# Patient Record
Sex: Female | Born: 1950 | Race: White | Hispanic: No | Marital: Married | Smoking: Former smoker
Health system: Southern US, Community
[De-identification: ages and names within clinical notes are randomized; demographics above are authoritative.]

## PROBLEM LIST (undated history)

## (undated) DIAGNOSIS — M47818 Spondylosis without myelopathy or radiculopathy, sacral and sacrococcygeal region: Secondary | ICD-10-CM

## (undated) DIAGNOSIS — K5900 Constipation, unspecified: Secondary | ICD-10-CM

## (undated) DIAGNOSIS — D649 Anemia, unspecified: Secondary | ICD-10-CM

## (undated) DIAGNOSIS — E785 Hyperlipidemia, unspecified: Secondary | ICD-10-CM

## (undated) DIAGNOSIS — M5416 Radiculopathy, lumbar region: Secondary | ICD-10-CM

## (undated) DIAGNOSIS — G43909 Migraine, unspecified, not intractable, without status migrainosus: Secondary | ICD-10-CM

## (undated) DIAGNOSIS — M549 Dorsalgia, unspecified: Secondary | ICD-10-CM

## (undated) DIAGNOSIS — Z90710 Acquired absence of both cervix and uterus: Secondary | ICD-10-CM

## (undated) DIAGNOSIS — Z981 Arthrodesis status: Secondary | ICD-10-CM

## (undated) DIAGNOSIS — F32A Depression, unspecified: Secondary | ICD-10-CM

## (undated) DIAGNOSIS — M797 Fibromyalgia: Secondary | ICD-10-CM

## (undated) DIAGNOSIS — C801 Malignant (primary) neoplasm, unspecified: Secondary | ICD-10-CM

## (undated) DIAGNOSIS — I2510 Atherosclerotic heart disease of native coronary artery without angina pectoris: Secondary | ICD-10-CM

## (undated) DIAGNOSIS — M9979 Connective tissue and disc stenosis of intervertebral foramina of abdomen and other regions: Secondary | ICD-10-CM

## (undated) DIAGNOSIS — M533 Sacrococcygeal disorders, not elsewhere classified: Secondary | ICD-10-CM

## (undated) DIAGNOSIS — G8918 Other acute postprocedural pain: Secondary | ICD-10-CM

## (undated) DIAGNOSIS — K219 Gastro-esophageal reflux disease without esophagitis: Secondary | ICD-10-CM

## (undated) DIAGNOSIS — R011 Cardiac murmur, unspecified: Secondary | ICD-10-CM

## (undated) DIAGNOSIS — G501 Atypical facial pain: Secondary | ICD-10-CM

## (undated) DIAGNOSIS — M1990 Unspecified osteoarthritis, unspecified site: Secondary | ICD-10-CM

## (undated) DIAGNOSIS — M539 Dorsopathy, unspecified: Secondary | ICD-10-CM

## (undated) DIAGNOSIS — M545 Low back pain: Secondary | ICD-10-CM

## (undated) DIAGNOSIS — I10 Essential (primary) hypertension: Secondary | ICD-10-CM

## (undated) DIAGNOSIS — I209 Angina pectoris, unspecified: Secondary | ICD-10-CM

## (undated) DIAGNOSIS — F419 Anxiety disorder, unspecified: Secondary | ICD-10-CM

## (undated) DIAGNOSIS — G8929 Other chronic pain: Secondary | ICD-10-CM

## (undated) DIAGNOSIS — F329 Major depressive disorder, single episode, unspecified: Secondary | ICD-10-CM

## (undated) HISTORY — PX: TONSILLECTOMY: SUR1361

## (undated) HISTORY — DX: Dorsalgia, unspecified: M54.9

## (undated) HISTORY — PX: TOE FUSION: SHX1070

## (undated) HISTORY — DX: Arthrodesis status: Z98.1

## (undated) HISTORY — DX: Migraine, unspecified, not intractable, without status migrainosus: G43.909

## (undated) HISTORY — DX: Radiculopathy, lumbar region: M54.16

## (undated) HISTORY — DX: Low back pain: M54.5

## (undated) HISTORY — PX: TUMOR REMOVAL: SHX12

## (undated) HISTORY — PX: SHOULDER SURGERY: SHX246

## (undated) HISTORY — DX: Unspecified osteoarthritis, unspecified site: M19.90

## (undated) HISTORY — DX: Anxiety disorder, unspecified: F41.9

## (undated) HISTORY — DX: Essential (primary) hypertension: I10

## (undated) HISTORY — PX: ABDOMINAL HYSTERECTOMY: SHX81

## (undated) HISTORY — DX: Anemia, unspecified: D64.9

## (undated) HISTORY — DX: Angina pectoris, unspecified: I20.9

## (undated) HISTORY — DX: Depression, unspecified: F32.A

## (undated) HISTORY — PX: OTHER SURGICAL HISTORY: SHX169

## (undated) HISTORY — DX: Atherosclerotic heart disease of native coronary artery without angina pectoris: I25.10

## (undated) HISTORY — DX: Fibromyalgia: M79.7

## (undated) HISTORY — DX: Other acute postprocedural pain: G89.18

## (undated) HISTORY — DX: Major depressive disorder, single episode, unspecified: F32.9

## (undated) HISTORY — DX: Acquired absence of both cervix and uterus: Z90.710

## (undated) HISTORY — PX: NECK SURGERY: SHX720

## (undated) HISTORY — PX: CHOLECYSTECTOMY: SHX55

## (undated) HISTORY — DX: Hyperlipidemia, unspecified: E78.5

## (undated) HISTORY — DX: Gastro-esophageal reflux disease without esophagitis: K21.9

## (undated) HISTORY — DX: Sacrococcygeal disorders, not elsewhere classified: M53.3

## (undated) HISTORY — PX: APPENDECTOMY: SHX54

## (undated) HISTORY — DX: Atypical facial pain: G50.1

## (undated) HISTORY — DX: Connective tissue and disc stenosis of intervertebral foramina of abdomen and other regions: M99.79

## (undated) HISTORY — DX: Dorsopathy, unspecified: M53.9

## (undated) HISTORY — DX: Other chronic pain: G89.29

## (undated) HISTORY — DX: Spondylosis without myelopathy or radiculopathy, sacral and sacrococcygeal region: M47.818

---

## 1977-07-01 DIAGNOSIS — Z90710 Acquired absence of both cervix and uterus: Secondary | ICD-10-CM

## 1977-07-01 HISTORY — DX: Acquired absence of both cervix and uterus: Z90.710

## 1998-05-26 ENCOUNTER — Encounter: Payer: Self-pay | Admitting: Neurosurgery

## 1998-05-26 ENCOUNTER — Ambulatory Visit (HOSPITAL_COMMUNITY): Admission: RE | Admit: 1998-05-26 | Discharge: 1998-05-26 | Payer: Self-pay | Admitting: Neurosurgery

## 1998-06-06 ENCOUNTER — Encounter: Payer: Self-pay | Admitting: Neurosurgery

## 1998-06-09 ENCOUNTER — Encounter: Payer: Self-pay | Admitting: Neurosurgery

## 1998-06-09 ENCOUNTER — Inpatient Hospital Stay (HOSPITAL_COMMUNITY): Admission: RE | Admit: 1998-06-09 | Discharge: 1998-06-10 | Payer: Self-pay | Admitting: Neurosurgery

## 1998-09-28 ENCOUNTER — Ambulatory Visit (HOSPITAL_COMMUNITY): Admission: RE | Admit: 1998-09-28 | Discharge: 1998-09-28 | Payer: Self-pay | Admitting: Neurosurgery

## 1998-09-28 ENCOUNTER — Encounter: Payer: Self-pay | Admitting: Neurosurgery

## 1998-11-01 ENCOUNTER — Encounter: Payer: Self-pay | Admitting: Neurosurgery

## 1998-11-01 ENCOUNTER — Ambulatory Visit (HOSPITAL_COMMUNITY): Admission: RE | Admit: 1998-11-01 | Discharge: 1998-11-01 | Payer: Self-pay | Admitting: Neurosurgery

## 2004-04-30 ENCOUNTER — Ambulatory Visit: Payer: Self-pay | Admitting: Unknown Physician Specialty

## 2004-05-03 ENCOUNTER — Ambulatory Visit: Payer: Self-pay | Admitting: Physician Assistant

## 2004-08-30 ENCOUNTER — Ambulatory Visit: Payer: Self-pay | Admitting: Physician Assistant

## 2004-09-17 ENCOUNTER — Emergency Department: Payer: Self-pay | Admitting: Emergency Medicine

## 2005-01-07 ENCOUNTER — Ambulatory Visit: Payer: Self-pay | Admitting: Physician Assistant

## 2005-03-01 ENCOUNTER — Ambulatory Visit: Payer: Self-pay | Admitting: Nurse Practitioner

## 2005-05-02 ENCOUNTER — Ambulatory Visit: Payer: Self-pay | Admitting: Physician Assistant

## 2005-06-04 ENCOUNTER — Ambulatory Visit: Payer: Self-pay

## 2005-08-26 ENCOUNTER — Ambulatory Visit: Payer: Self-pay | Admitting: Physician Assistant

## 2005-12-23 ENCOUNTER — Ambulatory Visit: Payer: Self-pay | Admitting: Physician Assistant

## 2006-04-14 ENCOUNTER — Ambulatory Visit: Payer: Self-pay | Admitting: Physician Assistant

## 2006-07-14 ENCOUNTER — Ambulatory Visit: Payer: Self-pay | Admitting: Physician Assistant

## 2006-10-14 ENCOUNTER — Ambulatory Visit: Payer: Self-pay | Admitting: Physician Assistant

## 2006-11-19 ENCOUNTER — Ambulatory Visit: Payer: Self-pay | Admitting: Physician Assistant

## 2006-11-27 ENCOUNTER — Ambulatory Visit: Payer: Self-pay | Admitting: Unknown Physician Specialty

## 2006-11-27 ENCOUNTER — Ambulatory Visit: Payer: Self-pay | Admitting: Pain Medicine

## 2006-12-10 ENCOUNTER — Ambulatory Visit: Payer: Self-pay | Admitting: Unknown Physician Specialty

## 2006-12-11 ENCOUNTER — Ambulatory Visit: Payer: Self-pay | Admitting: Physician Assistant

## 2006-12-29 ENCOUNTER — Emergency Department: Payer: Self-pay | Admitting: Emergency Medicine

## 2007-01-12 ENCOUNTER — Ambulatory Visit: Payer: Self-pay | Admitting: Physician Assistant

## 2007-01-20 ENCOUNTER — Ambulatory Visit: Payer: Self-pay | Admitting: Pain Medicine

## 2007-02-09 ENCOUNTER — Ambulatory Visit: Payer: Self-pay

## 2007-02-25 ENCOUNTER — Emergency Department (HOSPITAL_COMMUNITY): Admission: EM | Admit: 2007-02-25 | Discharge: 2007-02-25 | Payer: Self-pay | Admitting: Emergency Medicine

## 2007-03-06 ENCOUNTER — Emergency Department: Payer: Self-pay | Admitting: Emergency Medicine

## 2007-03-06 ENCOUNTER — Ambulatory Visit: Payer: Self-pay | Admitting: Physician Assistant

## 2007-04-06 ENCOUNTER — Ambulatory Visit: Payer: Self-pay | Admitting: Physician Assistant

## 2007-05-05 ENCOUNTER — Encounter: Payer: Self-pay | Admitting: Neurosurgery

## 2007-06-01 ENCOUNTER — Encounter: Payer: Self-pay | Admitting: Neurosurgery

## 2007-06-07 ENCOUNTER — Ambulatory Visit: Payer: Self-pay | Admitting: Internal Medicine

## 2007-06-19 ENCOUNTER — Ambulatory Visit: Payer: Self-pay | Admitting: Physician Assistant

## 2007-07-06 ENCOUNTER — Ambulatory Visit: Payer: Self-pay | Admitting: Unknown Physician Specialty

## 2007-07-14 ENCOUNTER — Ambulatory Visit: Payer: Self-pay | Admitting: Pain Medicine

## 2007-07-23 ENCOUNTER — Ambulatory Visit: Payer: Self-pay | Admitting: Physician Assistant

## 2007-07-24 ENCOUNTER — Ambulatory Visit: Payer: Self-pay | Admitting: Neurology

## 2007-08-20 ENCOUNTER — Ambulatory Visit: Payer: Self-pay | Admitting: Physician Assistant

## 2007-08-23 ENCOUNTER — Emergency Department: Payer: Self-pay | Admitting: Emergency Medicine

## 2007-08-25 ENCOUNTER — Ambulatory Visit: Payer: Self-pay | Admitting: Otolaryngology

## 2007-10-15 ENCOUNTER — Ambulatory Visit: Payer: Self-pay | Admitting: Otolaryngology

## 2007-11-11 ENCOUNTER — Ambulatory Visit: Payer: Self-pay | Admitting: Physician Assistant

## 2007-11-19 ENCOUNTER — Ambulatory Visit: Payer: Self-pay | Admitting: Pain Medicine

## 2007-12-10 ENCOUNTER — Ambulatory Visit: Payer: Self-pay | Admitting: Physician Assistant

## 2008-01-07 ENCOUNTER — Ambulatory Visit: Payer: Self-pay | Admitting: Pain Medicine

## 2008-02-09 ENCOUNTER — Ambulatory Visit: Payer: Self-pay | Admitting: Physician Assistant

## 2008-02-29 ENCOUNTER — Ambulatory Visit: Payer: Self-pay | Admitting: Pain Medicine

## 2008-03-03 ENCOUNTER — Ambulatory Visit: Payer: Self-pay | Admitting: Pain Medicine

## 2008-03-22 ENCOUNTER — Ambulatory Visit: Payer: Self-pay | Admitting: Physician Assistant

## 2008-03-29 ENCOUNTER — Ambulatory Visit: Payer: Self-pay | Admitting: Unknown Physician Specialty

## 2008-04-11 ENCOUNTER — Ambulatory Visit: Payer: Self-pay | Admitting: Pain Medicine

## 2008-04-28 ENCOUNTER — Ambulatory Visit: Payer: Self-pay | Admitting: Physician Assistant

## 2008-08-23 ENCOUNTER — Ambulatory Visit: Payer: Self-pay | Admitting: Physician Assistant

## 2008-09-01 ENCOUNTER — Ambulatory Visit (HOSPITAL_COMMUNITY): Admission: RE | Admit: 2008-09-01 | Discharge: 2008-09-02 | Payer: Self-pay | Admitting: Neurosurgery

## 2008-12-06 ENCOUNTER — Ambulatory Visit: Payer: Self-pay | Admitting: Physician Assistant

## 2009-03-08 ENCOUNTER — Ambulatory Visit: Payer: Self-pay | Admitting: Pain Medicine

## 2009-03-23 ENCOUNTER — Ambulatory Visit: Payer: Self-pay | Admitting: Physician Assistant

## 2009-04-27 ENCOUNTER — Ambulatory Visit: Payer: Self-pay | Admitting: Unknown Physician Specialty

## 2009-05-30 ENCOUNTER — Ambulatory Visit: Payer: Self-pay | Admitting: Physician Assistant

## 2009-06-08 ENCOUNTER — Ambulatory Visit (HOSPITAL_COMMUNITY): Admission: RE | Admit: 2009-06-08 | Discharge: 2009-06-09 | Payer: Self-pay | Admitting: Neurosurgery

## 2009-08-28 ENCOUNTER — Ambulatory Visit: Payer: Self-pay | Admitting: Pain Medicine

## 2009-09-05 ENCOUNTER — Ambulatory Visit: Payer: Self-pay | Admitting: Pain Medicine

## 2009-10-05 ENCOUNTER — Ambulatory Visit: Payer: Self-pay | Admitting: Pain Medicine

## 2010-01-27 ENCOUNTER — Encounter: Admission: RE | Admit: 2010-01-27 | Discharge: 2010-01-27 | Payer: Self-pay | Admitting: Neurosurgery

## 2010-04-17 ENCOUNTER — Ambulatory Visit: Payer: Self-pay | Admitting: Family Medicine

## 2010-05-30 ENCOUNTER — Ambulatory Visit: Payer: Self-pay | Admitting: Unknown Physician Specialty

## 2010-06-06 ENCOUNTER — Ambulatory Visit: Payer: Self-pay | Admitting: Family Medicine

## 2010-07-19 ENCOUNTER — Ambulatory Visit: Payer: Self-pay | Admitting: Pain Medicine

## 2010-08-08 ENCOUNTER — Ambulatory Visit: Payer: Self-pay | Admitting: Pain Medicine

## 2010-09-11 ENCOUNTER — Ambulatory Visit: Payer: Self-pay | Admitting: Pain Medicine

## 2010-09-24 ENCOUNTER — Ambulatory Visit: Payer: Self-pay | Admitting: Pain Medicine

## 2010-10-02 LAB — CBC
HCT: 38.6 % (ref 36.0–46.0)
Hemoglobin: 13.3 g/dL (ref 12.0–15.0)
MCHC: 34.5 g/dL (ref 30.0–36.0)
MCV: 94.9 fL (ref 78.0–100.0)
Platelets: 196 10*3/uL (ref 150–400)
RBC: 4.07 MIL/uL (ref 3.87–5.11)
RDW: 14 % (ref 11.5–15.5)
WBC: 6 10*3/uL (ref 4.0–10.5)

## 2010-10-16 ENCOUNTER — Ambulatory Visit: Payer: Self-pay | Admitting: Family Medicine

## 2010-10-16 LAB — COMPREHENSIVE METABOLIC PANEL
ALT: 26 U/L (ref 0–35)
AST: 32 U/L (ref 0–37)
Albumin: 3.5 g/dL (ref 3.5–5.2)
Alkaline Phosphatase: 89 U/L (ref 39–117)
BUN: 8 mg/dL (ref 6–23)
CO2: 28 mEq/L (ref 19–32)
Calcium: 9.3 mg/dL (ref 8.4–10.5)
Chloride: 103 mEq/L (ref 96–112)
Creatinine, Ser: 0.82 mg/dL (ref 0.4–1.2)
GFR calc Af Amer: >60 mL/min (ref >60)
GFR calc non Af Amer: >60 mL/min (ref >60)
Glucose, Bld: 87 mg/dL (ref 70–99)
Potassium: 4.2 mEq/L (ref 3.5–5.1)
Sodium: 138 mEq/L (ref 135–145)
Total Bilirubin: 0.5 mg/dL (ref 0.3–1.2)
Total Protein: 5.4 g/dL — ABNORMAL LOW (ref 6.0–8.3)

## 2010-10-16 LAB — CBC
HCT: 38.9 % (ref 36.0–46.0)
Hemoglobin: 13.3 g/dL (ref 12.0–15.0)
MCHC: 34.2 g/dL (ref 30.0–36.0)
MCV: 100.2 fL — ABNORMAL HIGH (ref 78.0–100.0)
Platelets: 217 10*3/uL (ref 150–400)
RBC: 3.89 MIL/uL (ref 3.87–5.11)
RDW: 13.8 % (ref 11.5–15.5)
WBC: 4 10*3/uL (ref 4.0–10.5)

## 2010-11-13 NOTE — Op Note (Signed)
NAMETAZIYAH, IANNUZZI              ACCOUNT NO.:  [id number]   MEDICAL RECORD NO.:  [id number]          PATIENT TYPE:  OIB   LOCATION:  3599                         FACILITY:  [facility]   PHYSICIAN:  Danae Orleans. Venetia Maxon, M.D.  DATE OF BIRTH:  April 22, 1951   DATE OF PROCEDURE:  09/01/2008  DATE OF DISCHARGE:                               OPERATIVE REPORT   PREOPERATIVE DIAGNOSES:  Herniated cervical disk with cervical  spondylosis, degenerative disk disease, and radiculopathy with neck  pain, C4-5 and C5-6.   POSTOPERATIVE DIAGNOSES:  Herniated cervical disk with cervical  spondylosis, degenerative disk disease, and radiculopathy with neck  pain, C4-5 and C5-6.   PROCEDURES:  1. Exploration and fusion, C6-7 with anterior cervical decompression      and fusion, C4-5 and C5-6 with allograft, autograft, and      Equivabone.  2. Anterior cervical plating, C4-C6 levels.   SURGEON:  Danae Orleans. Venetia Maxon, MD   ASSISTANTS:  1. Georgiann Cocker, RN  2. Hilda Lias, MD   ANESTHESIA:  General endotracheal anesthesia.   ESTIMATED BLOOD LOSS:  Minimal.   COMPLICATIONS:  None.   DISPOSITION:  Recovery.   INDICATIONS:  Davetta Olliff is a 60 year old woman with cervical  spondylosis, disk degeneration, cervical radiculopathy and neck pain, C4-  5 and C5-6 levels.  She had previously undergone anterior cervical  decompression and fusion, C6-7 levels.  It was elected to take her to  surgery for anterior cervical decompression and fusion at C4-5 and C5-6,  and exploration and fusion at C6-7 level.   PROCEDURE:  Ms. Hanneman was brought to the operating room.  Following  satisfactory and uncomplicated induction of general endotracheal  anesthesia and placement of intravenous lines, she was placed in the  supine position on the operating table.  The neck was maintained in  neutral alignment.  She was placed in 5 pounds Halter traction.  Anterior neck was then prepped and draped in usual sterile fashion.   The  area of planned incision was infiltrated with local lidocaine.  Previous  incision was reopened on the left side of midline and carried through  platysma layer.  Subplatysmal dissection was performed exposing the  anterior border of the sternocleidomastoid muscle using blunt  dissection.  The carotid sheath was kept lateral, trachea and esophagus  kept medial exposing the anterior cervical spine.  A previously placed  anterior cervical plate was cleared of investing scar tissue and removed  the bony interfaces of the previous fusion level were inspected.  There  appeared to be a solid arthrodesis at this level.  The large ventral  osteophytes were then removed at C5-6 and at C4-5.  Longus colli muscles  were taken down from the anterior cervical spine with electrocautery and  Key elevator, and subsequently self-retaining Shadow-Line retractor was  placed along with up and down retractor.  The interspaces were incised  and disk material was removed in a piecemeal fashion.  Distraction pins  were placed initially at C4 and C5 and using gentle distraction, the  interspace was opened.  Endplates were decorticated with a high-speed  drill and  bone autograft saved for later use with bone grafting.  The  uncinate spurs were also drilled down with high-speed drill.  The  posterior longitudinal vein was removed and detached in a piecemeal  fashion and the uncinate spurs were removed.  Spinal cord dura at both  C5 nerve roots were decompressed.  Hemostasis was assured and after  trial sizing, a 6-mm allograft bone wedge was selected, fashioned with  high-speed drill, packed with morcellized bone autograft, and Equivabone  was inserted in the interspace and countersunk appropriately.  Attention  was then turned to the C5-6 level.  A similar decompression was  performed and again spinal cord dura at both C6 nerve roots were widely  decompressed.  A similarly sized graft was selected, fashioned  with high-  speed drill, packed with morcellized bone autograft, and Equivabone was  inserted in the interspace and countersunk appropriately.  A 32-mm  Trestle anterior cervical plate was affixed to the anterior cervical  spine, traction weight was removed prior to placing the plate.  The 4.5  x 14 mm screws were placed at C6 and previous screw holes, and 4.0 x 14  mm screws were placed at the C4 and C5.  All screws had excellent  purchase.  Locking mechanisms were engaged.  Final x-ray demonstrated  well-positioned interbody grafts and anterior cervical plate.  The wound  was then irrigated.  Soft tissues were inspected and found to be in good  repair.  Hemostasis was assured.  Platysma layer was closed with 3-0  Vicryl sutures.  Skin edges were approximated with 3-0 Vicryl  subcuticular stitch.  Wound was dressed with Dermabond.  The patient was  extubated in the operating room and taken to recovery in stable  satisfactory condition having tolerated the operation well.  Counts were  correct at the end of the case.      Danae Orleans. Venetia Maxon, M.D.  Electronically Signed     JDS/MEDQ  D:  09/01/2008  T:  09/02/2008  Job:  [id number]

## 2011-03-29 ENCOUNTER — Other Ambulatory Visit (HOSPITAL_COMMUNITY): Payer: Self-pay | Admitting: Neurosurgery

## 2011-03-29 DIAGNOSIS — M542 Cervicalgia: Secondary | ICD-10-CM

## 2011-04-16 ENCOUNTER — Other Ambulatory Visit (HOSPITAL_COMMUNITY): Payer: Self-pay | Admitting: Neurosurgery

## 2011-04-16 ENCOUNTER — Ambulatory Visit (HOSPITAL_COMMUNITY): Admission: RE | Admit: 2011-04-16 | Payer: Medicare Other | Source: Ambulatory Visit

## 2011-04-16 ENCOUNTER — Ambulatory Visit (HOSPITAL_COMMUNITY)
Admission: RE | Admit: 2011-04-16 | Discharge: 2011-04-16 | Disposition: A | Discharge status: Home / Self Care | Payer: Medicare Other | Source: Ambulatory Visit | Attending: Neurosurgery | Admitting: Neurosurgery

## 2011-04-16 DIAGNOSIS — M545 Low back pain, unspecified: Secondary | ICD-10-CM

## 2011-04-16 DIAGNOSIS — Z9889 Other specified postprocedural states: Secondary | ICD-10-CM

## 2011-04-16 DIAGNOSIS — Z981 Arthrodesis status: Secondary | ICD-10-CM | POA: Insufficient documentation

## 2011-04-16 DIAGNOSIS — M542 Cervicalgia: Secondary | ICD-10-CM

## 2011-04-16 DIAGNOSIS — M5020 Other cervical disc displacement, unspecified cervical region: Secondary | ICD-10-CM | POA: Insufficient documentation

## 2011-04-16 MED ORDER — IOHEXOL 300 MG/ML  SOLN
10.0000 mL | Freq: Once | INTRAMUSCULAR | Status: AC | PRN
  Administered 2011-04-16: 10 mL via INTRATHECAL

## 2011-04-17 ENCOUNTER — Emergency Department: Payer: Self-pay | Admitting: *Deleted

## 2011-11-05 ENCOUNTER — Ambulatory Visit: Payer: Self-pay | Admitting: Family Medicine

## 2011-11-21 ENCOUNTER — Ambulatory Visit: Payer: Self-pay | Admitting: Pain Medicine

## 2011-12-17 ENCOUNTER — Ambulatory Visit: Payer: Self-pay | Admitting: Pain Medicine

## 2011-12-19 ENCOUNTER — Ambulatory Visit: Payer: Self-pay | Admitting: Pain Medicine

## 2012-01-07 ENCOUNTER — Ambulatory Visit: Payer: Self-pay | Admitting: Pain Medicine

## 2012-01-09 ENCOUNTER — Ambulatory Visit: Payer: Self-pay | Admitting: Pain Medicine

## 2012-01-26 ENCOUNTER — Emergency Department: Payer: Self-pay | Admitting: Emergency Medicine

## 2012-01-26 LAB — CBC WITH DIFFERENTIAL/PLATELET
Basophil #: 0 10*3/uL (ref 0.0–0.1)
Basophil %: 0.9 %
Eosinophil #: 0 10*3/uL (ref 0.0–0.7)
Eosinophil %: 0.5 %
HCT: 41.1 % (ref 35.0–47.0)
HGB: 14.2 g/dL (ref 12.0–16.0)
Lymphocyte #: 0.9 10*3/uL — ABNORMAL LOW (ref 1.0–3.6)
Lymphocyte %: 23.3 %
MCH: 32.8 pg (ref 26.0–34.0)
MCHC: 34.5 g/dL (ref 32.0–36.0)
MCV: 95 fL (ref 80–100)
Monocyte #: 0.5 x10 3/mm (ref 0.2–0.9)
Monocyte %: 12.4 %
Neutrophil #: 2.5 10*3/uL (ref 1.4–6.5)
Neutrophil %: 62.9 %
Platelet: 197 10*3/uL (ref 150–440)
RBC: 4.32 10*6/uL (ref 3.80–5.20)
RDW: 13.9 % (ref 11.5–14.5)
WBC: 4 10*3/uL (ref 3.6–11.0)

## 2012-01-26 LAB — COMPREHENSIVE METABOLIC PANEL
Albumin: 3.4 g/dL (ref 3.4–5.0)
Alkaline Phosphatase: 90 U/L (ref 50–136)
Anion Gap: 8 (ref 7–16)
BUN: 5 mg/dL — ABNORMAL LOW (ref 7–18)
Bilirubin,Total: 0.4 mg/dL (ref 0.2–1.0)
Calcium, Total: 8.6 mg/dL (ref 8.5–10.1)
Chloride: 111 mmol/L — ABNORMAL HIGH (ref 98–107)
Co2: 26 mmol/L (ref 21–32)
Creatinine: 0.86 mg/dL (ref 0.60–1.30)
EGFR (African American): >60
EGFR (Non-African Amer.): >60
Glucose: 92 mg/dL (ref 65–99)
Osmolality: 286 (ref 275–301)
Potassium: 3.5 mmol/L (ref 3.5–5.1)
SGOT(AST): 30 U/L (ref 15–37)
SGPT (ALT): 32 U/L
Sodium: 145 mmol/L (ref 136–145)
Total Protein: 6.2 g/dL — ABNORMAL LOW (ref 6.4–8.2)

## 2012-02-24 ENCOUNTER — Other Ambulatory Visit: Payer: Self-pay | Admitting: Neurosurgery

## 2012-02-24 DIAGNOSIS — M542 Cervicalgia: Secondary | ICD-10-CM

## 2012-03-03 ENCOUNTER — Ambulatory Visit
Admission: RE | Admit: 2012-03-03 | Discharge: 2012-03-03 | Disposition: A | Discharge status: Home / Self Care | Payer: Medicare Other | Source: Ambulatory Visit | Attending: Neurosurgery | Admitting: Neurosurgery

## 2012-03-03 DIAGNOSIS — M542 Cervicalgia: Secondary | ICD-10-CM

## 2012-03-03 MED ORDER — GADOBENATE DIMEGLUMINE 529 MG/ML IV SOLN
12.0000 mL | Freq: Once | INTRAVENOUS | Status: AC | PRN
  Administered 2012-03-03: 12 mL via INTRAVENOUS

## 2012-04-13 ENCOUNTER — Ambulatory Visit: Payer: Self-pay | Admitting: Pain Medicine

## 2012-07-08 ENCOUNTER — Ambulatory Visit: Payer: Self-pay | Admitting: Pain Medicine

## 2012-07-21 ENCOUNTER — Ambulatory Visit: Payer: Self-pay | Admitting: Pain Medicine

## 2012-10-12 ENCOUNTER — Ambulatory Visit: Payer: Self-pay | Admitting: Pain Medicine

## 2012-11-03 ENCOUNTER — Ambulatory Visit: Payer: Self-pay | Admitting: Pain Medicine

## 2012-11-11 ENCOUNTER — Ambulatory Visit: Payer: Self-pay | Admitting: Podiatry

## 2012-12-07 ENCOUNTER — Ambulatory Visit: Payer: Self-pay | Admitting: Pain Medicine

## 2012-12-08 ENCOUNTER — Other Ambulatory Visit: Payer: Self-pay | Admitting: Pain Medicine

## 2012-12-08 LAB — MAGNESIUM: Magnesium: 2.2 mg/dL

## 2013-01-12 ENCOUNTER — Ambulatory Visit: Payer: Self-pay | Admitting: Pain Medicine

## 2013-02-05 ENCOUNTER — Ambulatory Visit: Payer: Self-pay | Admitting: Pain Medicine

## 2013-02-23 ENCOUNTER — Ambulatory Visit: Payer: Self-pay | Admitting: Pain Medicine

## 2013-03-17 ENCOUNTER — Ambulatory Visit: Payer: Self-pay | Admitting: Internal Medicine

## 2013-04-13 ENCOUNTER — Ambulatory Visit: Payer: Self-pay | Admitting: Neurosurgery

## 2013-04-16 ENCOUNTER — Ambulatory Visit: Payer: Self-pay | Admitting: Pain Medicine

## 2013-05-04 ENCOUNTER — Ambulatory Visit: Payer: Self-pay | Admitting: Pain Medicine

## 2013-05-25 ENCOUNTER — Ambulatory Visit: Payer: Self-pay | Admitting: Pain Medicine

## 2013-07-22 ENCOUNTER — Ambulatory Visit: Payer: Self-pay | Admitting: Pain Medicine

## 2013-10-08 ENCOUNTER — Ambulatory Visit: Payer: Self-pay | Admitting: Pain Medicine

## 2013-10-26 ENCOUNTER — Ambulatory Visit: Payer: Self-pay | Admitting: Pain Medicine

## 2013-12-10 ENCOUNTER — Ambulatory Visit: Payer: Self-pay | Admitting: Pain Medicine

## 2013-12-21 ENCOUNTER — Ambulatory Visit: Payer: Self-pay | Admitting: Pain Medicine

## 2013-12-27 ENCOUNTER — Ambulatory Visit: Payer: Self-pay | Admitting: Pain Medicine

## 2013-12-30 ENCOUNTER — Ambulatory Visit: Payer: Self-pay | Admitting: Pain Medicine

## 2014-01-15 DIAGNOSIS — F411 Generalized anxiety disorder: Secondary | ICD-10-CM | POA: Insufficient documentation

## 2014-01-19 ENCOUNTER — Ambulatory Visit: Payer: Self-pay | Admitting: Podiatry

## 2014-01-26 ENCOUNTER — Ambulatory Visit: Payer: Self-pay | Admitting: Pain Medicine

## 2014-02-15 ENCOUNTER — Ambulatory Visit: Payer: Self-pay | Admitting: Pain Medicine

## 2014-03-02 ENCOUNTER — Other Ambulatory Visit: Payer: Self-pay | Admitting: Pain Medicine

## 2014-03-02 ENCOUNTER — Ambulatory Visit: Payer: Self-pay | Admitting: Pain Medicine

## 2014-03-02 LAB — PROTIME-INR
INR: 1
Prothrombin Time: 12.7 secs (ref 11.5–14.7)

## 2014-03-02 LAB — FERRITIN: Ferritin (ARMC): 23 ng/mL (ref 8–388)

## 2014-03-02 LAB — MAGNESIUM: Magnesium: 2.3 mg/dL

## 2014-03-30 ENCOUNTER — Ambulatory Visit: Payer: Self-pay | Admitting: Pain Medicine

## 2014-04-14 ENCOUNTER — Ambulatory Visit: Payer: Self-pay | Admitting: Nurse Practitioner

## 2014-04-29 ENCOUNTER — Ambulatory Visit: Payer: Self-pay | Admitting: Pain Medicine

## 2014-05-10 ENCOUNTER — Ambulatory Visit: Payer: Self-pay | Admitting: Pain Medicine

## 2014-05-18 ENCOUNTER — Ambulatory Visit: Payer: Self-pay | Admitting: Pain Medicine

## 2014-08-09 ENCOUNTER — Ambulatory Visit: Payer: Self-pay | Admitting: Internal Medicine

## 2014-11-08 DIAGNOSIS — E7849 Other hyperlipidemia: Secondary | ICD-10-CM | POA: Insufficient documentation

## 2014-12-26 ENCOUNTER — Ambulatory Visit: Payer: Medicare PPO | Attending: Neurosurgery | Admitting: Physical Therapy

## 2014-12-26 ENCOUNTER — Encounter: Payer: Self-pay | Admitting: Physical Therapy

## 2014-12-26 DIAGNOSIS — M545 Low back pain, unspecified: Secondary | ICD-10-CM

## 2014-12-26 DIAGNOSIS — R262 Difficulty in walking, not elsewhere classified: Secondary | ICD-10-CM | POA: Diagnosis not present

## 2014-12-26 NOTE — Therapy (Signed)
[place] MAIN [facility] SERVICES [address] [place], [place], [address] Phone: [phone]   Fax:  [phone]  Physical Therapy Evaluation  Patient Details  Name: Jacqueline Thornton MRN: [id number] Date of Birth: 06-22-1951 Referring Provider:  Erline Levine, MD  Encounter Date: 12/26/2014      PT End of Session - 12/26/14 1356    Visit Number 1   Number of Visits 9   Date for PT Re-Evaluation 01/25/15      History reviewed. No pertinent past medical history.  History reviewed. No pertinent past surgical history.  There were no vitals filed for this visit.  Visit Diagnosis:  Low back pain at multiple sites  Difficulty walking      Subjective Assessment - 12/26/14 1312    Subjective Patient has chronic low back pain and now it is also on her left hip that started when she was needing to wear a boot for her surgery on her left toe. For the past 2 years it was hurting and now the leg is having burning pain.             [facility] PT Assessment - 12/26/14 1314    Assessment   Medical Diagnosis LBP   Onset Date/Surgical Date 11/30/14   Hand Dominance Right   Next MD Visit February 14, 2015   Prior Therapy many years ago   Restrictions   Weight Bearing Restrictions No   Balance Screen   Has the patient fallen in the past 6 months No   Has the patient had a decrease in activity level because of a fear of falling?  Yes   Is the patient reluctant to leave their home because of a fear of falling?  Yes   [place] Private residence   Living Arrangements Spouse/significant other   Available Help at Discharge Family   Type of [place] to enter   Entrance Stairs-Number of Steps 3   Entrance Stairs-Rails Can reach both   Home Layout Two level   Alternate Level Stairs-Number of Steps 12   Alternate Level Stairs-Rails Can reach both   [place] None   Prior Function   Level of  Independence Independent   Vocation On disability   Functional Tests   Functional tests Sit to Stand   Sit to Stand   Comments 5 x sit to stand 27.80  10 MW  1.05       6 MW                Evaluation findings: Repeated motions flex x 10 = worse Repeated  Motions extension x 10 = better + FABER bilaterally, + L SLR , + R SLR, RLE + prone knee bend Strength R hip ext 3/5, L hip ext 3-/5, Left hip abd-3/5, left hip flex 3/5, R hip flex 4/5 Sensation LLE thigh burning and sensitive to touch  ODI 46%                        PT Long Term Goals - 12/26/14 1336    PT LONG TERM GOAL #1   Title Patient will be independent in home exercise program to improve strength/mobility for better functional independence with ADL's 01/25/15   PT LONG TERM GOAL #2   Title Patient (> 63 years old) will complete five times sit to stand test in < 15 seconds indicating an increased LE  strength and improved balance.01/25/15   PT LONG TERM GOAL #3   Title  Patient will be able to perform household work/ chores without increase in symptoms 01/25/15   PT LONG TERM GOAL #4   Title Patient will report a worst pain of 3/10 on VAS in             to improve tolerance with ADLs and reduced symptoms with activities. 01/25/15               Plan - 07-Jan-2015 1356    Clinical Impression Statement Patient presents with chronic low back pain and burning pain in left thigh and hip. She has decresed streength BLE, + FABER bilaterally, + crossed SLR, + prone flex text RLE. She has poor outcome measures of 5 x sit to stand, and 6 MWT and decreased gait speed. She will continue to benefit from skilled PT to improve pain level and mobility.   Pt will benefit from skilled therapeutic intervention in order to improve on the following deficits Abnormal gait;Decreased endurance;Decreased activity tolerance;Decreased strength;Pain;Decreased mobility;Difficulty walking;Increased muscle spasms   Rehab Potential Fair    PT Frequency 2x / week   PT Duration 4 weeks   PT Treatment/Interventions Ultrasound;Gait training;Cryotherapy;Electrical Stimulation;Moist Heat;Therapeutic exercise;Therapeutic activities;Manual techniques;Dry needling   PT Next Visit Plan prone extension exercises   PT Home Exercise Plan Prone extension   Consulted and Agree with Plan of Care Patient          G-Codes - January 07, 2015 1340    Functional Assessment Tool Used 10 MW, 6 MW, 5 x sit to stand   Functional Limitation Mobility: Walking and moving around   Mobility: Walking and Moving Around Current Status [phone]) At least 40 percent but less than 60 percent impaired, limited or restricted   Mobility: Walking and Moving Around Goal Status ([id number]) At least 20 percent but less than 40 percent impaired, limited or restricted       Problem List There are no active problems to display for this patient.   Arelia Sneddon S 2015-01-07, 2:06 PM  [place] MAIN [facility] SERVICES [address] [place], [place], [address] Phone: [phone]   Fax:  [phone]

## 2014-12-28 ENCOUNTER — Ambulatory Visit: Payer: Medicare PPO | Admitting: Physical Therapy

## 2014-12-28 ENCOUNTER — Encounter: Payer: Self-pay | Admitting: Physical Therapy

## 2014-12-28 DIAGNOSIS — M545 Low back pain, unspecified: Secondary | ICD-10-CM

## 2014-12-28 DIAGNOSIS — R262 Difficulty in walking, not elsewhere classified: Secondary | ICD-10-CM

## 2014-12-28 NOTE — Therapy (Signed)
[place] MAIN [facility] SERVICES [address] [place], [place], [address] Phone: [phone]   Fax:  [phone]  Physical Therapy Treatment  Patient Details  Name: Jacqueline Thornton MRN: [id number] Date of Birth: [birth year] Referring Provider:  Tomasita Morrow, MD  Encounter Date: 12/28/2014      PT End of Session - 12/28/14 1757    Visit Number 2   Number of Visits 9   Date for PT Re-Evaluation 01/25/15   PT Start Time 0500   PT Stop Time 0540   PT Time Calculation (min) 40 min      History reviewed. No pertinent past medical history.  History reviewed. No pertinent past surgical history.  There were no vitals filed for this visit.  Visit Diagnosis:  Low back pain at multiple sites  Difficulty walking      Subjective Assessment - 12/28/14 1753    Subjective Patient is having severe low back and left hip pain 7/10 that goes down to her left ankle.   Currently in Pain? Yes   Pain Score 7    Pain Location Back         Patient has high pain level and was seen for [place] to low back pulsed at 1.5 CM squared followed by e-stim IFC crossed pattern x 20 minutes and HP low back.  Patients pain level decreased from 7/10 to 5 /10 and her gait was less painful.  She was instructed in a modified HEP.                              PT Long Term Goals - 12/26/14 1336    PT LONG TERM GOAL #1   Title Patient will be independent in home exercise program to improve strength/mobility for better functional independence with ADL's 01/25/15   PT LONG TERM GOAL #2   Title Patient (> 6 years old) will complete five times sit to stand test in < 15 seconds indicating an increased LE strength and improved balance.01/25/15   PT LONG TERM GOAL #3   Title  Patient will be able to perform household work/ chores without increase in symptoms 01/25/15   PT LONG TERM GOAL #4   Title Patient will report a worst pain of 3/10 on VAS in              to improve tolerance with ADLs and reduced symptoms with activities. 01/25/15               Plan - 12/28/14 1755    Clinical Impression Statement Patient continues to have low back pain that is severe and she has a limp and decreased gait speed and antalgic gait. She was able to decrease her pain from 7/10 to 5/10 with modalities. Patients exercises were modified to standing trunk extension.    Pt will benefit from skilled therapeutic intervention in order to improve on the following deficits Abnormal gait;Decreased endurance;Decreased activity tolerance;Decreased strength;Pain;Decreased mobility;Difficulty walking;Increased muscle spasms   PT Frequency 2x / week   PT Duration 4 weeks   PT Treatment/Interventions Ultrasound;Gait training;Cryotherapy;Electrical Stimulation;Moist Heat;Therapeutic exercise;Therapeutic activities;Manual techniques;Dry needling   PT Next Visit Plan prone extension exercises   PT Home Exercise Plan Prone extension   Consulted and Agree with Plan of Care Patient        Problem List There are no active problems to display for this patient.   Arelia Sneddon S 12/28/2014, 5:59 PM  [place] MAIN [facility] SERVICES [address] [place], [place], [address] Phone: [phone]   Fax:  [phone]

## 2015-01-04 ENCOUNTER — Encounter: Payer: Self-pay | Admitting: Physical Therapy

## 2015-01-04 ENCOUNTER — Ambulatory Visit: Payer: Medicare PPO | Attending: Neurosurgery | Admitting: Physical Therapy

## 2015-01-04 DIAGNOSIS — R262 Difficulty in walking, not elsewhere classified: Secondary | ICD-10-CM | POA: Diagnosis not present

## 2015-01-04 DIAGNOSIS — M545 Low back pain, unspecified: Secondary | ICD-10-CM

## 2015-01-04 NOTE — Therapy (Signed)
[place] MAIN [facility] SERVICES [address] [place], [place], [address] Phone: [phone]   Fax:  [phone]  Physical Therapy Treatment  Patient Details  Name: Jacqueline Thornton MRN: [id number] Date of Birth: [birth year] Referring Provider:  Erline Levine, MD  Encounter Date: 30-Jan-2015      PT End of Session - Jan 30, 2015 1409    Visit Number 3   Number of Visits 9   Date for PT Re-Evaluation 01/25/15   PT Start Time 0145   PT Stop Time 0230   PT Time Calculation (min) 45 min      History reviewed. No pertinent past medical history.  History reviewed. No pertinent past surgical history.  There were no vitals filed for this visit.  Visit Diagnosis:  Low back pain at multiple sites  Difficulty walking      Subjective Assessment - 2015/01/30 1404    Subjective Patient continues to have 6/10 back pain. She responds minmmally to the e-stim and exercises and will be DCed from PT due to persistant pain symptoms.   Currently in Pain? Yes   Pain Score 6    Pain Location Hip   Pain Orientation Left      Patient was seen for e-stim to left hip with IFC and crossed pattern and Heat pad Reviewed HEP of standing and prone extension                            PT Education - 01-30-15 1408    Education provided Yes   Person(s) Educated Patient   Methods Explanation   Comprehension Verbalized understanding             PT Long Term Goals - Jan 30, 2015 1412    PT LONG TERM GOAL #1   Status Achieved   PT LONG TERM GOAL #2   Status Partially Met   PT LONG TERM GOAL #3   Status Partially Met   PT LONG TERM GOAL #4   Status Partially Met               Plan - 30-Jan-2015 1409    Clinical Impression Statement Patient continues to have low back pain and LLE hip pain that is constant and 6/10. She has temporary relief with dicreased pain that does not last very long. She has been instructed in HEP and will be DC  ed due to lack of functional progress.    Pt will benefit from skilled therapeutic intervention in order to improve on the following deficits Abnormal gait;Decreased endurance;Decreased activity tolerance;Decreased strength;Pain;Decreased mobility;Difficulty walking;Increased muscle spasms   Rehab Potential Fair   PT Frequency 2x / week   PT Duration 4 weeks   PT Treatment/Interventions Ultrasound;Gait training;Cryotherapy;Electrical Stimulation;Moist Heat;Therapeutic exercise;Therapeutic activities;Manual techniques;Dry needling   PT Next Visit Plan prone extension exercises   PT Home Exercise Plan Prone extension          G-Codes - 01/30/2015 1413    Functional Assessment Tool Used 10 MW, 6 MW, 5 x sit to stand   Functional Limitation Mobility: Walking and moving around   Mobility: Walking and Moving Around Goal Status [phone]) At least 40 percent but less than 60 percent impaired, limited or restricted      Problem List There are no active problems to display for this patient.   Arelia Sneddon S 01-30-15, 2:39 PM  [place] MAIN [facility] SERVICES [place],  [place], [address] Phone: [phone]   Fax:  [phone]

## 2015-01-09 ENCOUNTER — Encounter: Payer: Medicare PPO | Admitting: Physical Therapy

## 2015-01-11 ENCOUNTER — Encounter: Payer: Medicare PPO | Admitting: Physical Therapy

## 2015-01-23 ENCOUNTER — Encounter: Payer: Medicare PPO | Admitting: Physical Therapy

## 2015-01-25 ENCOUNTER — Encounter: Payer: Medicare PPO | Admitting: Physical Therapy

## 2015-03-08 ENCOUNTER — Other Ambulatory Visit: Payer: Self-pay | Admitting: Neurosurgery

## 2015-04-06 ENCOUNTER — Encounter: Payer: Self-pay | Admitting: Pain Medicine

## 2015-04-06 ENCOUNTER — Ambulatory Visit: Payer: Medicare PPO | Attending: Pain Medicine | Admitting: Pain Medicine

## 2015-04-06 VITALS — BP 161/56 | HR 70 | Temp 97.8°F | Resp 16 | Ht 61.0 in | Wt 143.0 lb

## 2015-04-06 DIAGNOSIS — M129 Arthropathy, unspecified: Secondary | ICD-10-CM | POA: Insufficient documentation

## 2015-04-06 DIAGNOSIS — M545 Low back pain, unspecified: Secondary | ICD-10-CM

## 2015-04-06 DIAGNOSIS — M4726 Other spondylosis with radiculopathy, lumbar region: Secondary | ICD-10-CM | POA: Diagnosis not present

## 2015-04-06 DIAGNOSIS — M5441 Lumbago with sciatica, right side: Secondary | ICD-10-CM

## 2015-04-06 DIAGNOSIS — M47896 Other spondylosis, lumbar region: Secondary | ICD-10-CM

## 2015-04-06 DIAGNOSIS — M47818 Spondylosis without myelopathy or radiculopathy, sacral and sacrococcygeal region: Secondary | ICD-10-CM

## 2015-04-06 DIAGNOSIS — M47816 Spondylosis without myelopathy or radiculopathy, lumbar region: Secondary | ICD-10-CM

## 2015-04-06 DIAGNOSIS — M5442 Lumbago with sciatica, left side: Secondary | ICD-10-CM

## 2015-04-06 DIAGNOSIS — M533 Sacrococcygeal disorders, not elsewhere classified: Secondary | ICD-10-CM

## 2015-04-06 DIAGNOSIS — M961 Postlaminectomy syndrome, not elsewhere classified: Secondary | ICD-10-CM

## 2015-04-06 DIAGNOSIS — Z9889 Other specified postprocedural states: Secondary | ICD-10-CM

## 2015-04-06 DIAGNOSIS — M539 Dorsopathy, unspecified: Secondary | ICD-10-CM

## 2015-04-06 DIAGNOSIS — M542 Cervicalgia: Secondary | ICD-10-CM

## 2015-04-06 DIAGNOSIS — G894 Chronic pain syndrome: Secondary | ICD-10-CM | POA: Diagnosis not present

## 2015-04-06 DIAGNOSIS — G8929 Other chronic pain: Secondary | ICD-10-CM | POA: Insufficient documentation

## 2015-04-06 DIAGNOSIS — Z981 Arthrodesis status: Secondary | ICD-10-CM | POA: Insufficient documentation

## 2015-04-06 DIAGNOSIS — M47897 Other spondylosis, lumbosacral region: Secondary | ICD-10-CM

## 2015-04-06 DIAGNOSIS — M5136 Other intervertebral disc degeneration, lumbar region: Secondary | ICD-10-CM

## 2015-04-06 DIAGNOSIS — M47812 Spondylosis without myelopathy or radiculopathy, cervical region: Secondary | ICD-10-CM

## 2015-04-06 HISTORY — DX: Low back pain, unspecified: M54.50

## 2015-04-06 MED ORDER — KETOROLAC TROMETHAMINE 60 MG/2ML IM SOLN
INTRAMUSCULAR | Status: AC
  Administered 2015-04-06: 60 mg via INTRAMUSCULAR
  Filled 2015-04-06: qty 2

## 2015-04-06 MED ORDER — ORPHENADRINE CITRATE 30 MG/ML IJ SOLN
60.0000 mg | Freq: Once | INTRAMUSCULAR | Status: AC
  Administered 2015-04-06: 60 mg via INTRAMUSCULAR

## 2015-04-06 MED ORDER — ORPHENADRINE CITRATE 30 MG/ML IJ SOLN
INTRAMUSCULAR | Status: AC
  Administered 2015-04-06: 60 mg via INTRAMUSCULAR
  Filled 2015-04-06: qty 2

## 2015-04-06 MED ORDER — KETOROLAC TROMETHAMINE 60 MG/2ML IM SOLN
60.0000 mg | Freq: Once | INTRAMUSCULAR | Status: AC
  Administered 2015-04-06: 60 mg via INTRAMUSCULAR

## 2015-04-06 NOTE — Progress Notes (Signed)
Safety precautions to be maintained throughout the outpatient stay will include: orient to surroundings, keep bed in low position, maintain call bell within reach at all times, provide assistance with transfer out of bed and ambulation.  No pills counted today- work in for back pain- wanted a procedure but has [employer]

## 2015-04-07 ENCOUNTER — Telehealth: Payer: Self-pay | Admitting: *Deleted

## 2015-04-07 NOTE — Telephone Encounter (Signed)
Left voice mail

## 2015-04-11 DIAGNOSIS — Z981 Arthrodesis status: Secondary | ICD-10-CM | POA: Insufficient documentation

## 2015-04-11 DIAGNOSIS — D649 Anemia, unspecified: Secondary | ICD-10-CM | POA: Insufficient documentation

## 2015-04-11 DIAGNOSIS — M545 Low back pain: Secondary | ICD-10-CM

## 2015-04-11 DIAGNOSIS — M47812 Spondylosis without myelopathy or radiculopathy, cervical region: Secondary | ICD-10-CM | POA: Insufficient documentation

## 2015-04-11 DIAGNOSIS — Z9889 Other specified postprocedural states: Secondary | ICD-10-CM | POA: Insufficient documentation

## 2015-04-11 DIAGNOSIS — M533 Sacrococcygeal disorders, not elsewhere classified: Secondary | ICD-10-CM

## 2015-04-11 DIAGNOSIS — M5136 Other intervertebral disc degeneration, lumbar region with discogenic back pain only: Secondary | ICD-10-CM | POA: Insufficient documentation

## 2015-04-11 DIAGNOSIS — M109 Gout, unspecified: Secondary | ICD-10-CM | POA: Insufficient documentation

## 2015-04-11 DIAGNOSIS — M47816 Spondylosis without myelopathy or radiculopathy, lumbar region: Secondary | ICD-10-CM | POA: Insufficient documentation

## 2015-04-11 DIAGNOSIS — E785 Hyperlipidemia, unspecified: Secondary | ICD-10-CM | POA: Insufficient documentation

## 2015-04-11 DIAGNOSIS — I2510 Atherosclerotic heart disease of native coronary artery without angina pectoris: Secondary | ICD-10-CM | POA: Insufficient documentation

## 2015-04-11 DIAGNOSIS — M542 Cervicalgia: Secondary | ICD-10-CM

## 2015-04-11 DIAGNOSIS — M9979 Connective tissue and disc stenosis of intervertebral foramina of abdomen and other regions: Secondary | ICD-10-CM

## 2015-04-11 DIAGNOSIS — G43909 Migraine, unspecified, not intractable, without status migrainosus: Secondary | ICD-10-CM | POA: Insufficient documentation

## 2015-04-11 DIAGNOSIS — F410 Panic disorder [episodic paroxysmal anxiety] without agoraphobia: Secondary | ICD-10-CM | POA: Insufficient documentation

## 2015-04-11 DIAGNOSIS — G8929 Other chronic pain: Secondary | ICD-10-CM | POA: Insufficient documentation

## 2015-04-11 DIAGNOSIS — M47818 Spondylosis without myelopathy or radiculopathy, sacral and sacrococcygeal region: Secondary | ICD-10-CM

## 2015-04-11 HISTORY — DX: Connective tissue and disc stenosis of intervertebral foramina of abdomen and other regions: M99.79

## 2015-04-11 HISTORY — DX: Sacrococcygeal disorders, not elsewhere classified: M53.3

## 2015-04-11 HISTORY — DX: Anemia, unspecified: D64.9

## 2015-04-11 HISTORY — DX: Arthrodesis status: Z98.1

## 2015-04-11 HISTORY — DX: Spondylosis without myelopathy or radiculopathy, sacral and sacrococcygeal region: M47.818

## 2015-04-11 NOTE — Progress Notes (Signed)
Patient's Name: Jacqueline Thornton MRN: [id number] DOB: Sep 22, 1950 DOS: 04/06/2015  Primary Reason(s) for Visit: Evaluation of uncontrolled established, chronic problem. CC: Back Pain and Leg Pain   HPI:   Jacqueline Thornton is a 64 y.o. year old, female patient, who returns today as an established patient. She has Low back pain; Chronic pain syndrome; Acute low back pain; Absolute anemia; Narrowing of intervertebral disc space; Gout; HLD (hyperlipidemia); Headache, migraine; Panic attack; Chronic low back pain; Lumbar spondylosis; Annular tear of lumbar disc; Discogenic low back pain; Failed back surgical syndrome; Facet hypertrophy of lumbar region; Facet syndrome, lumbar; Sacroiliac joint pain; Arthropathy of sacroiliac joint; Chronic neck pain; Cervical spondylosis; Hx of cervical spine surgery; Status post cervical spinal fusion; and Coronary atherosclerosis on her problem list.. Her primarily concern today is the Back Pain and Leg Pain   The patient is awaiting to have an intrathecal pump implanted. However, she is experiencing a flareup of her usual low back pain. She has requested that we schedule her for a lumbar epidural steroid injection. She indicates that her current medications are not covering the pain. Today I have provided her with a Toradol/Norflex injection and we have scheduled her to come back for the procedure.  Pharmacotherapy Review: Side-effects or Adverse reactions: None reported. Effectiveness: Described as relatively effective, allowing for increase in activities of daily living (ADL). Onset of action: Within expected pharmacological parameters. Duration of action: Within normal limits for medication. Peak effect: Timing and results are as within normal expected parameters. [place] PMP: Compliant with practice rules and regulations. DST: Compliant with practice rules and regulations. Lab work: No new labs ordered by our practice. Treatment compliance: Compliant. Substance Use  Disorder (SUD) Risk Level: Low Planned course of action: Continue therapy as is.  Allergies: Jacqueline Thornton has No Known Allergies.  Meds: The patient has a current medication list which includes the following prescription(s): cholecalciferol, duloxetine, esomeprazole, estradiol, fenofibrate, gabapentin, lorazepam, methocarbamol, oxycodone-acetaminophen, and oxycodone. Requested Prescriptions    No prescriptions requested or ordered in this encounter    ROS: Constitutional: Afebrile, no chills, well hydrated and well nourished Gastrointestinal: negative Musculoskeletal:positive for back pain Neurological: negative Behavioral/Psych: negative  PFSH: Medical:  Jacqueline Thornton  has a past medical history of Anxiety; Arthritis; Depression; GERD (gastroesophageal reflux disease); Hyperlipidemia; Asthma; Migraine; Chronic back pain; CAD (coronary artery disease); Angina pectoris ([place]); Spine disorder; and Fibromyalgia. Family: family history includes Diabetes in her father and mother; Hypertension in her sister; Stroke in her mother. Surgical:  has past surgical history that includes Cholecystectomy; Abdominal hysterectomy; Neck surgery (Bilateral); Appendectomy; Neck surgery; and Shoulder surgery. Tobacco:  reports that she has quit smoking. She does not have any smokeless tobacco history on file. Alcohol:  reports that she does not drink alcohol. Drug:  reports that she does not use illicit drugs.  Physical Exam: Vitals:  Today's Vitals   04/06/15 1235 04/06/15 1237 04/06/15 1252  BP: 161/56    Pulse: 70    Temp: 97.8 F (36.6 C)    TempSrc: Oral    Resp: 16    Height: 5\' 1"  (1.549 m)    Weight: 143 lb (64.864 kg)    SpO2: 100%    PainSc: 8  8  10-Worst pain ever  PainLoc: Back    Calculated BMI: Body mass index is 27.03 kg/(m^2). General appearance: alert Eyes: conjunctivae/corneas clear. PERRL, EOM's intact. Fundi benign. Lungs: No evidence respiratory distress, no audible rales  or ronchi and no use of accessory muscles of  respiration Neck: no adenopathy, no carotid bruit, no JVD, supple, symmetrical, trachea midline and thyroid not enlarged, symmetric, no tenderness/mass/nodules Back: The patient is currently having some acute low back pain. She is tender to palpation with increased muscle tone. Extremities: extremities normal, atraumatic, no cyanosis or edema Pulses: 2+ and symmetric Skin: Skin color, texture, turgor normal. No rashes or lesions Neurologic: Gait: Antalgic    Assessment: Encounter Diagnosis:  Primary Diagnosis: Acute low back pain [M54.5]  Plan: Hermine was seen today for back pain and leg pain.  Diagnoses and all orders for this visit:  Acute low back pain  Bilateral low back pain with sciatica, sciatica laterality unspecified -     ketorolac (TORADOL) injection 60 mg; Inject 2 mLs (60 mg total) into the muscle once. -     orphenadrine (NORFLEX) injection 60 mg; Inject 2 mLs (60 mg total) into the muscle once. -     LUMBAR EPIDURAL STEROID INJECTION; Future  Chronic pain syndrome  Chronic low back pain  Osteoarthritis of spine with radiculopathy, lumbar region  Annular tear of lumbar disc  Discogenic low back pain  Failed back surgical syndrome  Facet hypertrophy of lumbar region  Facet syndrome, lumbar  Sacroiliac joint pain  Arthropathy of sacroiliac joint  Chronic neck pain  Cervical spondylosis  Hx of cervical spine surgery  Status post cervical spinal fusion  Other orders -     orphenadrine (NORFLEX) 30 MG/ML injection;  -     ketorolac (TORADOL) 60 MG/2ML injection;      There are no Patient Instructions on file for this visit. Medications discontinued today:  There are no discontinued medications. Medications administered today:  We administered orphenadrine, ketorolac, ketorolac, and orphenadrine.  Primary Care Physician: Lavera Guise, MD Location: [facility] Note by:  Kathlen Brunswick Dossie Arbour, M.D, DABA, DABAPM, DABPM, DABIPP, FIPP

## 2015-04-18 ENCOUNTER — Ambulatory Visit: Payer: Medicare PPO | Attending: Pain Medicine | Admitting: Pain Medicine

## 2015-04-18 ENCOUNTER — Encounter: Payer: Self-pay | Admitting: Pain Medicine

## 2015-04-18 VITALS — BP 120/53 | HR 70 | Temp 98.7°F | Resp 16 | Ht 60.5 in | Wt 142.0 lb

## 2015-04-18 DIAGNOSIS — M5416 Radiculopathy, lumbar region: Secondary | ICD-10-CM

## 2015-04-18 DIAGNOSIS — F1120 Opioid dependence, uncomplicated: Secondary | ICD-10-CM | POA: Insufficient documentation

## 2015-04-18 DIAGNOSIS — G43909 Migraine, unspecified, not intractable, without status migrainosus: Secondary | ICD-10-CM | POA: Insufficient documentation

## 2015-04-18 DIAGNOSIS — Z5181 Encounter for therapeutic drug level monitoring: Secondary | ICD-10-CM

## 2015-04-18 DIAGNOSIS — M545 Low back pain, unspecified: Secondary | ICD-10-CM

## 2015-04-18 DIAGNOSIS — F1190 Opioid use, unspecified, uncomplicated: Secondary | ICD-10-CM | POA: Insufficient documentation

## 2015-04-18 DIAGNOSIS — M791 Myalgia, unspecified site: Secondary | ICD-10-CM | POA: Insufficient documentation

## 2015-04-18 DIAGNOSIS — E785 Hyperlipidemia, unspecified: Secondary | ICD-10-CM | POA: Diagnosis not present

## 2015-04-18 DIAGNOSIS — M5440 Lumbago with sciatica, unspecified side: Secondary | ICD-10-CM | POA: Diagnosis not present

## 2015-04-18 DIAGNOSIS — M47817 Spondylosis without myelopathy or radiculopathy, lumbosacral region: Secondary | ICD-10-CM | POA: Diagnosis not present

## 2015-04-18 DIAGNOSIS — M7918 Myalgia, other site: Secondary | ICD-10-CM

## 2015-04-18 DIAGNOSIS — M549 Dorsalgia, unspecified: Secondary | ICD-10-CM | POA: Diagnosis present

## 2015-04-18 DIAGNOSIS — Z79891 Long term (current) use of opiate analgesic: Secondary | ICD-10-CM | POA: Insufficient documentation

## 2015-04-18 HISTORY — DX: Radiculopathy, lumbar region: M54.16

## 2015-04-18 MED ORDER — MIDAZOLAM HCL 5 MG/5ML IJ SOLN
INTRAMUSCULAR | Status: AC
  Administered 2015-04-18: 11:00:00
  Filled 2015-04-18: qty 5

## 2015-04-18 MED ORDER — LIDOCAINE HCL (PF) 1 % IJ SOLN: 10.0000 mL | Freq: Once | INTRAMUSCULAR | Status: DC

## 2015-04-18 MED ORDER — ROPIVACAINE HCL 2 MG/ML IJ SOLN
INTRAMUSCULAR | Status: AC
  Administered 2015-04-18: 11:00:00
  Filled 2015-04-18: qty 20

## 2015-04-18 MED ORDER — IOHEXOL 180 MG/ML  SOLN: 10.0000 mL | Freq: Once | INTRAMUSCULAR | Status: DC | PRN

## 2015-04-18 MED ORDER — FENTANYL CITRATE (PF) 100 MCG/2ML IJ SOLN: 100.0000 ug | Freq: Once | INTRAMUSCULAR | Status: DC

## 2015-04-18 MED ORDER — IOHEXOL 180 MG/ML  SOLN
INTRAMUSCULAR | Status: AC
  Administered 2015-04-18: 11:00:00
  Filled 2015-04-18: qty 20

## 2015-04-18 MED ORDER — TRIAMCINOLONE ACETONIDE 40 MG/ML IJ SUSP: 40.0000 mg | Freq: Once | INTRAMUSCULAR | Status: DC

## 2015-04-18 MED ORDER — METHYLPREDNISOLONE ACETATE 40 MG/ML IJ SUSP: 40.0000 mg | Freq: Once | INTRAMUSCULAR | Status: DC

## 2015-04-18 MED ORDER — LIDOCAINE HCL (PF) 1 % IJ SOLN
INTRAMUSCULAR | Status: AC
  Administered 2015-04-18: 11:00:00
  Filled 2015-04-18: qty 5

## 2015-04-18 MED ORDER — OXYCODONE-ACETAMINOPHEN 7.5-325 MG PO TABS: 1.0000 | ORAL_TABLET | Freq: Every day | ORAL | Status: DC

## 2015-04-18 MED ORDER — SODIUM CHLORIDE 0.9 % IJ SOLN
INTRAMUSCULAR | Status: AC
  Administered 2015-04-18: 11:00:00
  Filled 2015-04-18: qty 10

## 2015-04-18 MED ORDER — METHOCARBAMOL 750 MG PO TABS: 750.0000 mg | ORAL_TABLET | Freq: Four times a day (QID) | ORAL | Status: DC

## 2015-04-18 MED ORDER — TRIAMCINOLONE ACETONIDE 40 MG/ML IJ SUSP
INTRAMUSCULAR | Status: AC
  Administered 2015-04-18: 11:00:00
  Filled 2015-04-18: qty 2

## 2015-04-18 MED ORDER — OXYCODONE-ACETAMINOPHEN 7.5-325 MG PO TABS: 1.0000 | ORAL_TABLET | Freq: Every day | ORAL | Status: DC | PRN

## 2015-04-18 MED ORDER — LACTATED RINGERS IV SOLN: 1000.0000 mL | INTRAVENOUS | Status: AC

## 2015-04-18 MED ORDER — SODIUM CHLORIDE 0.9 % IJ SOLN: 2.0000 mL | Freq: Once | INTRAMUSCULAR | Status: DC

## 2015-04-18 MED ORDER — ROPIVACAINE HCL 2 MG/ML IJ SOLN: 9.0000 mL | Freq: Once | INTRAMUSCULAR | Status: DC

## 2015-04-18 MED ORDER — ROPIVACAINE HCL 2 MG/ML IJ SOLN: 2.0000 mL | Freq: Once | INTRAMUSCULAR | Status: DC

## 2015-04-18 MED ORDER — FENTANYL CITRATE (PF) 100 MCG/2ML IJ SOLN
INTRAMUSCULAR | Status: AC
  Administered 2015-04-18: 100 ug via INTRAVENOUS
  Filled 2015-04-18: qty 2

## 2015-04-18 MED ORDER — MIDAZOLAM HCL 5 MG/5ML IJ SOLN: 5.0000 mg | Freq: Once | INTRAMUSCULAR | Status: DC

## 2015-04-18 NOTE — Progress Notes (Signed)
Safety precautions to be maintained throughout the outpatient stay will include: orient to surroundings, keep bed in low position, maintain call bell within reach at all times, provide assistance with transfer out of bed and ambulation.  

## 2015-04-18 NOTE — Patient Instructions (Signed)
Pain Management Discharge Instructions  General Discharge Instructions :  If you need to reach your doctor call: Monday-Friday 8:00 am - 4:00 pm at [phone] or toll free [phone].  After clinic hours [phone] to have operator reach doctor.  Bring all of your medication bottles to all your appointments in the pain clinic.  To cancel or reschedule your appointment with Pain Management please remember to call 24 hours in advance to avoid a fee.  Refer to the educational materials which you have been given on: General Risks, I had my Procedure. Discharge Instructions, Post Sedation.  Post Procedure Instructions:  The drugs you were given will stay in your system until tomorrow, so for the next 24 hours you should not drive, make any legal decisions or drink any alcoholic beverages.  You may eat anything you prefer, but it is better to start with liquids then soups and crackers, and gradually work up to solid foods.  Please notify your doctor immediately if you have any unusual bleeding, trouble breathing or pain that is not related to your normal pain.  Depending on the type of procedure that was done, some parts of your body may feel week and/or numb.  This usually clears up by tonight or the next day.  Walk with the use of an assistive device or accompanied by an adult for the 24 hours.  You may use ice on the affected area for the first 24 hours.  Put ice in a Ziploc bag and cover with a towel and place against area 15 minutes on 15 minutes off.  You may switch to heat after 24 hours.Epidural Steroid Injection An epidural steroid injection is given to relieve pain in your neck, back, or legs that is caused by the irritation or swelling of a nerve root. This procedure involves injecting a steroid and numbing medicine (anesthetic) into the epidural space. The epidural space is the space between the outer covering of your spinal cord and the bones that form your backbone  (vertebra).  LET YOUR HEALTH CARE PROVIDER KNOW ABOUT:   Any allergies you have.  All medicines you are taking, including vitamins, herbs, eye drops, creams, and over-the-counter medicines such as aspirin.  Previous problems you or members of your family have had with the use of anesthetics.  Any blood disorders or blood clotting disorders you have.  Previous surgeries you have had.  Medical conditions you have. RISKS AND COMPLICATIONS Generally, this is a safe procedure. However, as with any procedure, complications can occur. Possible complications of epidural steroid injection include:  Headache.  Bleeding.  Infection.  Allergic reaction to the medicines.  Damage to your nerves. The response to this procedure depends on the underlying cause of the pain and its duration. People who have long-term (chronic) pain are less likely to benefit from epidural steroids than are those people whose pain comes on strong and suddenly. BEFORE THE PROCEDURE   Ask your health care provider about changing or stopping your regular medicines. You may be advised to stop taking blood-thinning medicines a few days before the procedure.  You may be given medicines to reduce anxiety.  Arrange for someone to take you home after the procedure. PROCEDURE   You will remain awake during the procedure. You may receive medicine to make you relaxed.  You will be asked to lie on your stomach.  The injection site will be cleaned.  The injection site will be numbed with a medicine (local anesthetic).  A needle will be   injected through your skin into the epidural space.  Your health care provider will use an X-ray machine to ensure that the steroid is delivered closest to the affected nerve. You may have minimal discomfort at this time.  Once the needle is in the right position, the local anesthetic and the steroid will be injected into the epidural space.  The needle will then be removed and a  bandage will be applied to the injection site. AFTER THE PROCEDURE   You may be monitored for a short time before you go home.  You may feel weakness or numbness in your arm or leg, which disappears within hours.  You may be allowed to eat, drink, and take your regular medicine.  You may have soreness at the site of the injection.   This information is not intended to replace advice given to you by your health care provider. Make sure you discuss any questions you have with your health care provider.   Document Released: 09/24/2007 Document Revised: 02/17/2013 Document Reviewed: 12/04/2012 Elsevier Interactive Patient Education 2016 [employer]. Trigger Point Injection Trigger points are areas where you have muscle pain. A trigger point injection is a shot given in the trigger point to relieve that pain. A trigger point might feel like a knot in your muscle. It hurts to press on a trigger point. Sometimes the pain spreads out (radiates) to other parts of the body. For example, pressing on a trigger point in your shoulder might cause pain in your arm or neck. You might have one trigger point. Or, you might have more than one. People often have trigger points in their upper back and lower back. They also occur often in the neck and shoulders. Pain from a trigger point lasts for a long time. It can make it hard to keep moving. You might not be able to do the exercise or physical therapy that could help you deal with the pain. A trigger point injection may help. It does not work for everyone. But, it may relieve your pain for a few days or a few months. A trigger point injection does not cure long-lasting (chronic) pain. LET YOUR CAREGIVER KNOW ABOUT:  Any allergies (especially to latex, lidocaine, or steroids).  Blood-thinning medicines that you take. These drugs can lead to bleeding or bruising after an injection. They include:  Aspirin.  Ibuprofen.  Clopidogrel.  Warfarin.  Other  medicines you take. This includes all vitamins, herbs, eyedrops, over-the-counter medicines, and creams.  Use of steroids.  Recent infections.  Past problems with numbing medicines.  Bleeding problems.  Surgeries you have had.  Other health problems. RISKS AND COMPLICATIONS A trigger point injection is a safe treatment. However, problems may develop, such as:  Minor side effects usually go away in 1 to 2 days. These may include:  Soreness.  Bruising.  Stiffness.  More serious problems are rare. But, they may include:  Bleeding under the skin (hematoma).  Skin infection.  Breaking off of the needle under your skin.  Lung puncture.  The trigger point injection may not work for you. BEFORE THE PROCEDURE You may need to stop taking any medicine that thins your blood. This is to prevent bleeding and bruising. Usually these medicines are stopped several days before the injection. No other preparation is needed. PROCEDURE  A trigger point injection can be given in your caregiver's office or in a clinic. Each injection takes 2 minutes or less.  Your caregiver will feel for trigger points. The  caregiver may use a marker to circle the area for the injection.  The skin over the trigger point will be washed with a germ-killing (antiseptic) solution.  The caregiver pinches the spot for the injection.  Then, a very thin needle is used for the shot. You may feel pain or a twitching feeling when the needle enters the trigger point.  A numbing solution may be injected into the trigger point. Sometimes a drug to keep down swelling, redness, and warmth (inflammation) is also injected.  Your caregiver moves the needle around the trigger zone until the tightness and twitching goes away.  After the injection, your caregiver may put gentle pressure over the injection site.  Then it is covered with a bandage. AFTER THE PROCEDURE  You can go right home after the injection.  The  bandage can be taken off after a few hours.  You may feel sore and stiff for 1 to 2 days.  Go back to your regular activities slowly. Your caregiver may ask you to stretch your muscles. Do not do anything that takes extra energy for a few days.  Follow your caregiver's instructions to manage and treat other pain.   This information is not intended to replace advice given to you by your health care provider. Make sure you discuss any questions you have with your health care provider.   Document Released: 06/06/2011 Document Revised: 10/12/2012 Document Reviewed: 06/06/2011 Elsevier Interactive Patient Education [employer].

## 2015-04-18 NOTE — Progress Notes (Signed)
Patient's Name: Jacqueline Thornton MRN: [id number] DOB: 1950-09-18 DOS: 04/18/2015  Primary Reason(s) for Visit: Interventional Pain Management Treatment. The patient has requested that we'll also refill her medications today. CC: Back Pain  HPI:   Jacqueline Thornton is a 64 y.o. year old, female patient, who returns today as an established patient. She has Low back pain; Chronic pain syndrome; Acute low back pain; Absolute anemia; Narrowing of intervertebral disc space; Gout; HLD (hyperlipidemia); Headache, migraine; Panic attack; Chronic low back pain; Lumbar spondylosis; Annular tear of lumbar disc; Discogenic low back pain; Failed back surgical syndrome; Facet hypertrophy of lumbar region; Facet syndrome, lumbar; Sacroiliac joint pain; Arthropathy of sacroiliac joint; Chronic neck pain; Cervical spondylosis; Hx of cervical spine surgery; Status post cervical spinal fusion; Coronary atherosclerosis; Lumbar radicular pain; Long term current use of opiate analgesic; Encounter for therapeutic drug level monitoring; Uncomplicated opioid dependence ([place]); Opiate use; and Myofascial pain syndrome on her problem list.. Her primarily concern today is the Back Pain   She's pending to undergo an intrathecal pump implant.  Pharmacotherapy Review: Side-effects or Adverse reactions: None reported. Effectiveness: Described as relatively effective, allowing for increase in activities of daily living (ADL). Onset of action: Within expected pharmacological parameters. Duration of action: Within normal limits for medication. Peak effect: Timing and results are as within normal expected parameters. [place] PMP: Compliant with practice rules and regulations. DST: Compliant with practice rules and regulations. Lab work: No new labs ordered by our practice. Treatment compliance: Compliant. Substance Use Disorder (SUD) Risk Level: Low Planned course of action: Continue therapy as is.  Pre-Procedure Assessment: Jacqueline Thornton is  a 64 y.o. year old, female patient, seen today for interventional treatment. She has Low back pain; Chronic pain syndrome; Acute low back pain; Absolute anemia; Narrowing of intervertebral disc space; Gout; HLD (hyperlipidemia); Headache, migraine; Panic attack; Chronic low back pain; Lumbar spondylosis; Annular tear of lumbar disc; Discogenic low back pain; Failed back surgical syndrome; Facet hypertrophy of lumbar region; Facet syndrome, lumbar; Sacroiliac joint pain; Arthropathy of sacroiliac joint; Chronic neck pain; Cervical spondylosis; Hx of cervical spine surgery; Status post cervical spinal fusion; Coronary atherosclerosis; Lumbar radicular pain; Long term current use of opiate analgesic; Encounter for therapeutic drug level monitoring; Uncomplicated opioid dependence ([place]); Opiate use; and Myofascial pain syndrome on her problem list.. Her primarily concern today is the Back Pain Verification of the correct person, correct site (including marking of site), and correct procedure were performed and confirmed by the patient. Today's Vitals   04/18/15 1036 04/18/15 1045 04/18/15 1055 04/18/15 1105  BP: 113/57 122/54 114/56 120/53  Pulse: 87 76 76 70  Temp:      Resp: _0 Height:      Weight:      SpO2: 92% 99% 100% 100%  PainSc:  _1 PainLoc:      Calculated BMI: Body mass index is 27.27 kg/(m^2). Allergies: She has No Known Allergies.. Primary Diagnosis: Low back pain with sciatica, sciatica laterality unspecified, unspecified back pain laterality [M54.40]  Procedure: Type: Palliative Inter-Laminar Epidural Steroid Injection plus right-sided lumbar trigger point injection. Region: Lumbar Level: L5-S1 Level. Laterality: Left-Sided Paramedial  Indications: Spondylosis, Lumbosacral Region  Consent: Secured. Under the influence of no sedatives a written informed consent was obtained, after having provided information on the risks and possible complications. To fulfill our  ethical and legal obligations, as recommended by the American Medical Association's Code of Ethics, we have provided information to  the patient about our clinical impression; the nature and purpose of the treatment or procedure; the risks, benefits, and possible complications of the intervention; alternatives; the risk(s) and benefit(s) of the alternative treatment(s) or procedure(s); and the risk(s) and benefit(s) of doing nothing. The patient was provided information about the risks and possible complications associated with the procedure. In the case of spinal procedures these may include, but are not limited to, failure to achieve desired goals, infection, bleeding, organ or nerve damage, allergic reactions, paralysis, and death. In addition, the patient was informed that Medicine is not an exact science; therefore, there is also the possibility of unforeseen risks and possible complications that may result in a catastrophic outcome. The patient indicated having understood very clearly. We have given the patient no guarantees and we have made no promises. Enough time was given to the patient to ask questions, all of which were answered to the patient's satisfaction.  Pre-Procedure Preparation: Safety Precautions: Allergies reviewed. Appropriate site, procedure, and patient were confirmed by following the Joint Commission's Universal Protocol (UP.01.01.01), in the form of a "Time Out". The patient was asked to confirm marked site and procedure, before commencing. The patient was asked about blood thinners, or active infections, both of which were denied. Patient was assessed for positional comfort and all pressure points were checked before starting procedure. Monitoring:  As per clinic protocol. Infection Control Precautions: Sterile technique used. Standard Universal Precautions were taken as recommended by the Department of [facility] for Disease Control and Prevention (CDC). Standard  pre-surgical skin prep was conducted. Respiratory hygiene and cough etiquette was practiced. Hand hygiene observed. Safe injection practices and needle disposal techniques followed. SDV (single dose vial) medications used. Medications properly checked for expiration dates and contaminants. Personal protective equipment (PPE) used: Surgical mask. Sterile double glove technique. Radiation resistant gloves. Sterile surgical gloves.  Anesthesia, Analgesia, Anxiolysis: Type: Moderate (Conscious) Sedation & Local Anesthesia. Meaningful verbal contact was maintained, with the patient at all times during the procedure. Local Anesthetic(s): Lidocaine 1% Route: Intravenous (IV) IV Access: Secured. Sedation: Meaningful verbal contact was maintained at all times during the procedure. Indication(s):Anxiety  Description of Procedure Process:  Time-out: "Time-out" completed before starting procedure, as per protocol. Position: Prone Target Area: For Epidural Steroid injections, the target area is the  interlaminar space, initially targeting the lower border of the superior vertebral body lamina. Approach: Posterior approach. Area Prepped: Entire Posterior Lumbosacral Region Prepping solution: ChloraPrep (2% chlorhexidine gluconate and 70% isopropyl alcohol) Safety Precautions: Aspiration looking for blood return was conducted prior to all injections. At no point did we inject any substances, as a needle was being advanced. No attempts were made at seeking any paresthesias. Safe injection practices and needle disposal techniques used. Medications properly checked for expiration dates. SDV (single dose vial) medications used. Description of the Procedure: Protocol guidelines were followed. The patient was placed in position over the fluoroscopy table. The target area was identified and the area prepped in the usual manner. Skin desensitized using vapocoolant spray. Skin & deeper tissues infiltrated with local  anesthetic. Appropriate amount of time allowed to pass for local anesthetics to take effect. The procedure needle was introduced through the skin, ipsilateral to the reported pain, and advanced to the target area. Bone was contacted and the needle walked caudad, until the lamina was cleared. The epidural space was identified using "loss-of-resistance technique" with 2-3 ml of PF-NaCl (0.9% NSS), in a 5cc LOR glass syringe. Proper needle placement secured. Negative aspiration confirmed.  Solution injected in intermittent fashion, asking for systemic symptoms every 0.5cc of injectate. The needles were then removed and the area cleansed, making sure to leave some of the prepping solution back to take advantage of its long term bactericidal properties. Once a lumbar epidural steroid injection was completed, and then we moved onto the trigger point injection. The patient had already complained of a trigger point in the right PSIS area. The trigger point was located and using a 22-gauge 1-1/2 inch needle we proceeded to inject the solution after intermittent negative aspiration. The solution injected consisted of 0.2% ropivacaine (9 in ml) loss 40 mg of Depo-Medrol. EBL: None Materials & Medications Used:  Needle(s) Used: 20g - 10cm, Tuohy-style epidural needle Medications Administered today: We administered lidocaine (PF), sodium chloride, ropivacaine (PF) 2 mg/ml (0.2%), fentaNYL, triamcinolone acetonide, midazolam, and iohexol.Please see chart orders for dosing details.  Imaging Guidance:  Type of Imaging Technique: Fluoroscopy Guidance (Spinal) Indication(s): Assistance in needle guidance and placement for procedures requiring needle placement in or near specific anatomical locations not easily accessible without such assistance. Exposure Time: Please see nurses notes. Contrast: Before injecting any contrast, we confirmed that the patient did not have an allergy to iodine, shellfish, or radiological contrast.  Once satisfactory needle placement was completed at the desired level, radiological contrast was injected. Injection was conducted under continuous fluoroscopic guidance. Injection of contrast accomplished without complications. See chart for type and volume of contrast used. Fluoroscopic Guidance: I was personally present in the fluoroscopy suite, where the patient was placed in position for the procedure, over the fluoroscopy-compatible table. Fluoroscopy was manipulated, using "Tunnel Vision Technique", to obtain the best possible view of the target area, on the affected side. Parallax error was corrected before commencing the procedure. A "direction-depth-direction" technique was used to introduce the needle under continuous pulsed fluoroscopic guidance. Once the target was reached, antero-posterior, oblique, and lateral fluoroscopic projection views were taken to confirm needle placement in all planes. Permanently recorded images stored by scanning into EMR. Interpretation: Intraoperative imaging interpretation by performing Physician. Adequate needle placement confirmed. Adequate needle placement confirmed in AP, lateral, & Oblique Views. Appropriate spread of contrast to desired area. No evidence of afferent or efferent intravascular uptake. No intrathecal or subarachnoid spread observed.  Antibiotics:  Type:  Antibiotics Given (last 72 hours)    None      Indication(s): No indications identified.  Post-operative Assessment:  Complications: No immediate post-treatment complications were observed. Relevant Post-operative Information:  Disposition: Return to clinic for follow-up evaluation. The patient tolerated the entire procedure well. A repeat set of vitals were taken after the procedure and the patient was kept under observation following institutional policy, for this procedure. Post-procedural neurological assessment was performed, showing return to baseline, prior to discharge. The  patient was discharged home, once institutional criteria were met. The patient was provided with post-procedure discharge instructions, including a section on how to identify potential problems. Should any problems arise concerning this procedure, the patient was given instructions to immediately contact us, at any time, without hesitation. In any case, we plan to contact the patient by telephone for a follow-up status report regarding this interventional procedure. Comments:  The patient was provided with enough medication refills to last him until January 8.  Primary Care Physician: Lavera Guise, MD Location: [facility] Outpatient Pain Management Facility Note by: Kathlen Brunswick Dossie Arbour, M.D, DABA, DABAPM, DABPM, DABIPP, FIPP  Disclaimer:  Medicine is not an exact science. The only guarantee in medicine is that  nothing is guaranteed. It is important to note that the decision to proceed with this intervention was based on the information collected from the patient. The Data and conclusions were drawn from the patient's questionnaire, the interview, and the physical examination. Because the information was provided in large part by the patient, it cannot be guaranteed that it has not been purposely or unconsciously manipulated. Every effort has been made to obtain as much relevant data as possible for this evaluation. It is important to note that the conclusions that lead to this procedure are derived in large part from the available data. Always take into account that the treatment will also be dependent on availability of resources and existing treatment guidelines, considered by other Pain Management Practitioners as being common knowledge and practice, at the time of the intervention. For Medico-Legal purposes, it is also important to point out that variation in procedural techniques and pharmacological choices are the acceptable norm. The indications, contraindications, technique, and results of the above  procedure should only be interpreted and judged by a Board-Certified Interventional Pain Specialist with extensive familiarity and expertise in the same exact procedure and technique. Attempts at providing opinions without similar or greater experience and expertise than that of the treating physician will be considered as inappropriate and unethical, and shall result in a formal complaint to the state medical board and applicable specialty societies.

## 2015-04-19 ENCOUNTER — Telehealth: Payer: Self-pay

## 2015-04-19 NOTE — Telephone Encounter (Signed)
Left message

## 2015-04-24 ENCOUNTER — Other Ambulatory Visit: Payer: Self-pay | Admitting: Neurology

## 2015-04-24 ENCOUNTER — Encounter: Payer: Medicare PPO | Admitting: Pain Medicine

## 2015-04-24 DIAGNOSIS — H538 Other visual disturbances: Secondary | ICD-10-CM

## 2015-04-24 DIAGNOSIS — R51 Headache: Secondary | ICD-10-CM

## 2015-04-24 DIAGNOSIS — G501 Atypical facial pain: Secondary | ICD-10-CM

## 2015-04-24 DIAGNOSIS — R519 Headache, unspecified: Secondary | ICD-10-CM

## 2015-04-24 HISTORY — DX: Atypical facial pain: G50.1

## 2015-05-04 ENCOUNTER — Inpatient Hospital Stay (HOSPITAL_COMMUNITY): Admission: RE | Admit: 2015-05-04 | Payer: Medicare PPO | Source: Ambulatory Visit

## 2015-05-05 ENCOUNTER — Ambulatory Visit
Admission: RE | Admit: 2015-05-05 | Discharge: 2015-05-05 | Disposition: A | Discharge status: Home / Self Care | Payer: Medicare PPO | Source: Ambulatory Visit | Attending: Neurology | Admitting: Neurology

## 2015-05-05 DIAGNOSIS — R51 Headache: Secondary | ICD-10-CM | POA: Diagnosis not present

## 2015-05-05 DIAGNOSIS — R519 Headache, unspecified: Secondary | ICD-10-CM

## 2015-05-05 DIAGNOSIS — H538 Other visual disturbances: Secondary | ICD-10-CM | POA: Insufficient documentation

## 2015-05-05 DIAGNOSIS — G8929 Other chronic pain: Secondary | ICD-10-CM

## 2015-05-05 MED ORDER — GADOBENATE DIMEGLUMINE 529 MG/ML IV SOLN
15.0000 mL | Freq: Once | INTRAVENOUS | Status: AC | PRN
  Administered 2015-05-05: 15 mL via INTRAVENOUS

## 2015-05-11 ENCOUNTER — Ambulatory Visit: Payer: Medicare PPO | Admitting: Pain Medicine

## 2015-05-12 ENCOUNTER — Encounter (HOSPITAL_COMMUNITY): Admission: RE | Payer: Self-pay | Source: Ambulatory Visit

## 2015-05-12 ENCOUNTER — Ambulatory Visit (HOSPITAL_COMMUNITY): Admission: RE | Admit: 2015-05-12 | Payer: Medicare PPO | Source: Ambulatory Visit | Admitting: Neurosurgery

## 2015-05-12 SURGERY — PAIN PUMP INSERTION
Anesthesia: General

## 2015-05-24 ENCOUNTER — Ambulatory Visit: Payer: Medicare PPO | Admitting: Pain Medicine

## 2015-05-30 ENCOUNTER — Ambulatory Visit: Payer: Medicare PPO | Admitting: Pain Medicine

## 2015-06-13 ENCOUNTER — Telehealth: Payer: Self-pay | Admitting: *Deleted

## 2015-06-13 ENCOUNTER — Telehealth: Payer: Self-pay | Admitting: Pain Medicine

## 2015-06-13 NOTE — Telephone Encounter (Signed)
Please schedule patient for: Left L5-S1 lumbar epidural steroid injection under fluoroscopic guidance.  Administrative staff: For future reference... 1. Look to see if the patient has a PRN procedure ordered. If so, schedule that procedure. Always schedule when necessary procedures as if they were going to be done with sedation. It is easy to go from sedation to no sedation. The opposite is not true. 2. Find out if the patient is taking blood thinners. If the patient is taking a blood thinner, then wait for the nurses to tell you when to schedule the procedure. If the patient is not taking a blood thinner, just go ahead and schedule the per request the procedure. 3. Next, given the chart to the nurses so that they can call the patient and followed the usual "Fast-Track" protocol.  Nursing Staff: For future reference... 1. When these requests are passed onto you, follow the "Fast-Track" protocol. This consists of: The patient and completing that "Fast-Track" form over the phone to identify whether or not there are any contraindications to bring in the patient in for a procedure. 2. If the patient is taking a blood thinner, find out whether or not the patient has clearance from the prescribing physician to stop the medication as per protocol. Refer to the medication table to determine the amount time that the patient has to to stop the medication prior to the procedure. Schedule patient to come in after that time period has been completed. (Example Coumadin: 5 days). 3. In the event that you're identifying a problem while completing the form then consult me (Dr. Dossie Arbour) as to how to proceed. Otherwise, it is not necessary for me to get involved in this process.

## 2015-06-13 NOTE — Telephone Encounter (Signed)
Patient called with increased pain and would like to have procedure  Please enter order and advise Me for insurance prior authorization

## 2015-06-13 NOTE — Telephone Encounter (Signed)
Patient needs appointment for eval beofre scheduling eval appointment.

## 2015-06-14 ENCOUNTER — Ambulatory Visit: Payer: Medicare PPO | Admitting: Pain Medicine

## 2015-06-19 ENCOUNTER — Ambulatory Visit: Payer: Medicare PPO | Admitting: Pain Medicine

## 2015-07-06 ENCOUNTER — Other Ambulatory Visit: Payer: Self-pay | Admitting: Pain Medicine

## 2015-07-06 ENCOUNTER — Encounter: Payer: Self-pay | Admitting: Pain Medicine

## 2015-07-06 ENCOUNTER — Ambulatory Visit: Payer: Medicare PPO | Attending: Pain Medicine | Admitting: Pain Medicine

## 2015-07-06 VITALS — BP 97/45 | HR 56 | Temp 98.1°F | Resp 16 | Ht 61.0 in | Wt 143.0 lb

## 2015-07-06 DIAGNOSIS — Z87891 Personal history of nicotine dependence: Secondary | ICD-10-CM | POA: Insufficient documentation

## 2015-07-06 DIAGNOSIS — Z9049 Acquired absence of other specified parts of digestive tract: Secondary | ICD-10-CM | POA: Insufficient documentation

## 2015-07-06 DIAGNOSIS — M5416 Radiculopathy, lumbar region: Secondary | ICD-10-CM

## 2015-07-06 DIAGNOSIS — M109 Gout, unspecified: Secondary | ICD-10-CM | POA: Diagnosis not present

## 2015-07-06 DIAGNOSIS — M961 Postlaminectomy syndrome, not elsewhere classified: Secondary | ICD-10-CM

## 2015-07-06 DIAGNOSIS — F1120 Opioid dependence, uncomplicated: Secondary | ICD-10-CM | POA: Diagnosis not present

## 2015-07-06 DIAGNOSIS — F1190 Opioid use, unspecified, uncomplicated: Secondary | ICD-10-CM

## 2015-07-06 DIAGNOSIS — Z7189 Other specified counseling: Secondary | ICD-10-CM

## 2015-07-06 DIAGNOSIS — M791 Myalgia, unspecified site: Secondary | ICD-10-CM

## 2015-07-06 DIAGNOSIS — Z79899 Other long term (current) drug therapy: Secondary | ICD-10-CM

## 2015-07-06 DIAGNOSIS — G43909 Migraine, unspecified, not intractable, without status migrainosus: Secondary | ICD-10-CM | POA: Diagnosis not present

## 2015-07-06 DIAGNOSIS — Z5181 Encounter for therapeutic drug level monitoring: Secondary | ICD-10-CM

## 2015-07-06 DIAGNOSIS — M545 Low back pain: Secondary | ICD-10-CM | POA: Diagnosis not present

## 2015-07-06 DIAGNOSIS — G8929 Other chronic pain: Secondary | ICD-10-CM | POA: Insufficient documentation

## 2015-07-06 DIAGNOSIS — M549 Dorsalgia, unspecified: Secondary | ICD-10-CM | POA: Diagnosis present

## 2015-07-06 DIAGNOSIS — M539 Dorsopathy, unspecified: Secondary | ICD-10-CM | POA: Diagnosis not present

## 2015-07-06 DIAGNOSIS — M47816 Spondylosis without myelopathy or radiculopathy, lumbar region: Secondary | ICD-10-CM | POA: Diagnosis not present

## 2015-07-06 DIAGNOSIS — M5126 Other intervertebral disc displacement, lumbar region: Secondary | ICD-10-CM | POA: Insufficient documentation

## 2015-07-06 DIAGNOSIS — M533 Sacrococcygeal disorders, not elsewhere classified: Secondary | ICD-10-CM | POA: Insufficient documentation

## 2015-07-06 DIAGNOSIS — I2510 Atherosclerotic heart disease of native coronary artery without angina pectoris: Secondary | ICD-10-CM | POA: Diagnosis not present

## 2015-07-06 DIAGNOSIS — M51369 Other intervertebral disc degeneration, lumbar region without mention of lumbar back pain or lower extremity pain: Secondary | ICD-10-CM

## 2015-07-06 DIAGNOSIS — Z9889 Other specified postprocedural states: Secondary | ICD-10-CM | POA: Insufficient documentation

## 2015-07-06 DIAGNOSIS — G894 Chronic pain syndrome: Secondary | ICD-10-CM

## 2015-07-06 DIAGNOSIS — M79606 Pain in leg, unspecified: Secondary | ICD-10-CM | POA: Insufficient documentation

## 2015-07-06 DIAGNOSIS — M7918 Myalgia, other site: Secondary | ICD-10-CM

## 2015-07-06 DIAGNOSIS — M5136 Other intervertebral disc degeneration, lumbar region with discogenic back pain only: Secondary | ICD-10-CM

## 2015-07-06 DIAGNOSIS — Z981 Arthrodesis status: Secondary | ICD-10-CM | POA: Diagnosis not present

## 2015-07-06 DIAGNOSIS — Z79891 Long term (current) use of opiate analgesic: Secondary | ICD-10-CM

## 2015-07-06 DIAGNOSIS — E785 Hyperlipidemia, unspecified: Secondary | ICD-10-CM | POA: Insufficient documentation

## 2015-07-06 DIAGNOSIS — D649 Anemia, unspecified: Secondary | ICD-10-CM | POA: Insufficient documentation

## 2015-07-06 MED ORDER — METHOCARBAMOL 750 MG PO TABS: 750.0000 mg | ORAL_TABLET | Freq: Four times a day (QID) | ORAL | Status: DC

## 2015-07-06 MED ORDER — OXYCODONE-ACETAMINOPHEN 7.5-325 MG PO TABS: 1.0000 | ORAL_TABLET | Freq: Every day | ORAL | Status: DC

## 2015-07-06 MED ORDER — OXYCODONE-ACETAMINOPHEN 7.5-325 MG PO TABS: 1.0000 | ORAL_TABLET | ORAL | Status: DC | PRN

## 2015-07-06 MED ORDER — OXYCODONE-ACETAMINOPHEN 7.5-325 MG PO TABS: 1.0000 | ORAL_TABLET | Freq: Every day | ORAL | Status: DC | PRN

## 2015-07-06 NOTE — Progress Notes (Signed)
Patient's Name: Jacqueline Thornton MRN: [id number] DOB: 06-Mar-1951 DOS: 07/06/2015  Primary Reason(s) for Visit: Post-Procedure evaluation and pharmacological management of chronic pain. CC: Back Pain and Leg Pain   HPI:    Jacqueline Thornton is a 65 y.o. year old, female patient, who returns today as an established patient. She has Chronic pain syndrome; Absolute anemia; Gout; HLD (hyperlipidemia); Headache, migraine; Panic attack; Chronic low back pain; Lumbar spondylosis;  Lumbar annular disc tear (L4-5); Discogenic low back pain (L3-4 and L4-5); Failed back surgical syndrome; Lumbar facet hypertrophy; Facet syndrome, lumbar; Chronic neck pain; Cervical spondylosis; Hx of cervical spine surgery; Cervical spinal fusion (C6-7 interbody fusion); Coronary atherosclerosis; Long term current use of opiate analgesic; Encounter for therapeutic drug level monitoring; Uncomplicated opioid dependence ([place]); Opiate use; Myofascial pain syndrome; CAD in native artery; Atypical face pain; Anxiety, generalized; Muscle ache; Chronic pain; Chronic sacroiliac joint pain; Chronic lumbar radicular pain; Long term prescription opiate use; and Encounter for chronic pain management on her problem list.. Her primarily concern today is the Back Pain and Leg Pain     The patient returns to the clinics today for follow-up evaluation of her palliative lumbar epidural steroid injection and trigger point. The patient responded very well to this therapy and her pain seems to be under control for the time being. We will give her the option of when necessary procedure, as needed. The patient had been referred for an intrathecal pump placement but apparently the insurance company after having given approval, they seem to have retracted. This is extremely difficult for me to understand since the patient does have all the necessary indications for the implant. In the past she had a spinal cord stimulator that worked wonders for her, but  unfortunately the one that she had at that time, was not MRI compatible. The patient haven't was having some additional low back pain and her neurosurgeon, in order to complete the evaluation requested that we remove the perfectly functional device in order to do an MRI. The MRI was done and the Patient came back requesting to have the spinal cord stimulator implanted again, but this was not approved by the insurance company despite the fact that by now weak. Have MRI compatible spinal cord stimulator systems. Since they did not approved to have the device put back, we proceeded with a trial of intrathecal narcotics that proved to be very effective in controlling her pain. The patient was then referred to a surgeon for placement but unfortunately we again met significant resistance from the insurance company.  It is my professional opinion that this patient has chronic pain for which she will continue to need pain management for the rest of her life in the form of a combination of pharmacological therapy and interventional therapies. It is also my professional opinion that this patient should've had the spinal cord stimulator approved for re-implantation, or at the very least have an intrathecal pump.  Today's Pain Score: 6 , clinically she looks like a 3-4/10. Reported level of pain is incompatible with clinical obrservations. This may be secondary to a possible lack of understanding on how the pain scale works. Pain Type: Chronic pain Pain Location: Back Pain Orientation: Lower Pain Descriptors / Indicators: Aching, Constant Pain Frequency: Constant  Date of Last Visit: 04/18/15 Service Provided on Last Visit: Procedure (LESI)  Pharmacotherapy Review:   Side-effects or Adverse reactions: None reported Effectiveness: Described as relatively effective, allowing for increase in activities of daily living (ADL) Onset of action: Within  expected pharmacological parameters Duration of action: Within  normal limits for medication Peak effect: Timing and results are as within normal expected parameters [place] PMP: Compliant with practice rules and regulations UDS Results: Compliant UDS Interpretation: Patient appears to be compliant with practice rules and regulations Medication Assessment Form: Reviewed. Patient indicates being compliant with therapy Treatment compliance: Compliant Substance Use Disorder (SUD) Risk Level: Low Pharmacologic Plan: Continue therapy as is  Lab Work: Illicit Drugs No results found for: THCU, COCAINSCRNUR, PCPSCRNUR, MDMA, AMPHETMU, METHADONE, ETOH  Inflammation Markers No results found for: ESRSEDRATE, CRP  Renal Function Lab Results  Component Value Date   BUN 5* 01/26/2012   CREATININE 0.86 01/26/2012   GFRAA >60 01/26/2012   GFRNONAA >60 01/26/2012    Hepatic Function Lab Results  Component Value Date   AST 30 01/26/2012   ALT 32 01/26/2012   ALBUMIN 3.4 01/26/2012    Electrolytes Lab Results  Component Value Date   NA 145 01/26/2012   K 3.5 01/26/2012   CL 111* 01/26/2012   CALCIUM 8.6 01/26/2012    Procedure Assessment:   Procedure done on last visit: Left L5-S1 lumbar epidural steroid injection under fluoroscopic guidance plus right myoneural block, with moderate sedation. Side-effects or Adverse reactions: None reported Sedation: Procedure was performed with sedation  Results: Ultra-Short Term Relief (First 1 hour after procedure): 75 %  Possibly the results is influenced by the pharmacodynamic effect of the local anesthetic, as well as that of the intravenous analgesics and/or sedatives, when used Short Term Relief (Initial 4-6 hrs after procedure): 75 % Short-term relief confirms injected site to be the source of pain Long Term Relief : 75 % (6 weeks) Long-term benefit would suggest an inflammatory etiology to the pain   Current Relief (Now):  75%  Persistent relief would suggest effective anti-inflammatory effects from  steroids Interpretation of Results: Excellent response to therapy.  Allergies:  Jacqueline Thornton has No Known Allergies.  Meds:  The patient has a current medication list which includes the following prescription(s): cholecalciferol, duloxetine, enalapril, esomeprazole, estradiol, fenofibrate, gabapentin, lorazepam, methocarbamol, oxycodone-acetaminophen, oxycodone-acetaminophen, pravastatin, and oxycodone-acetaminophen. Requested Prescriptions   Signed Prescriptions Disp Refills  . oxyCODONE-acetaminophen (PERCOCET) 7.5-325 MG tablet 150 tablet 0    Sig: Take 1 tablet by mouth 5 (five) times daily.  Marland Kitchen oxyCODONE-acetaminophen (PERCOCET) 7.5-325 MG tablet 150 tablet 0    Sig: Take 1 tablet by mouth 5 (five) times daily as needed for severe pain.  . methocarbamol (ROBAXIN) 750 MG tablet 120 tablet 2    Sig: Take 1 tablet (750 mg total) by mouth 4 (four) times daily.  Marland Kitchen oxyCODONE-acetaminophen (PERCOCET) 7.5-325 MG tablet 150 tablet 0    Sig: Take 1 tablet by mouth every 5 (five) minutes as needed for moderate pain or severe pain.    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 Family: family history includes Diabetes in her father and mother; Hypertension in her sister; Stroke in her mother. Surgical:  has past surgical history  that includes Cholecystectomy; Abdominal hysterectomy; Neck surgery (Bilateral);  Appendectomy; Neck surgery; and Shoulder surgery. Tobacco:  reports that she has quit smoking. She does not have any smokeless tobacco history on file. Alcohol:  reports that she does not drink alcohol. Drug:  reports that she does not use illicit drugs.  Physical Exam:  Vitals:  Today's Vitals   07/06/15 1111 07/06/15 1113  BP:  97/45  Pulse: 56   Temp: 98.1 F (36.7 C)   Resp: 16   Height: 5' 1"  (1.549 m)   Weight: 143 lb (64.864 kg)   SpO2: 100%   PainSc: 6  6   PainLoc: Back   Calculated BMI: Body mass index is 27.03 kg/(m^2). General appearance: alert, cooperative, appears stated age and mild distress Eyes: PERLA Respiratory: No evidence respiratory distress, no audible rales or ronchi and no use of accessory muscles of respiration Neck: no adenopathy, no carotid bruit, no JVD, supple, symmetrical, trachea midline and thyroid not enlarged, symmetric, no tenderness/mass/nodules  Cervical Spine ROM: Adequate for flexion, extension, rotation, and lateral bending Palpation: No palpable trigger points  Upper Extremities ROM: Adequate bilaterally Strength: 5/5 for all flexors and extensors of the upper extremity, bilaterally Pulses: Palpable bilaterally Neurologic: No allodynia, No hyperesthesia, No hyperpathia and No sensory abnormalities  Lumbar Spine ROM: Adequate for flexion, extension, rotation, and lateral bending Palpation: No palpable trigger points Lumbar Hyperextension and rotation: Non-contributory Patrick's Maneuver: Non-contributory  Lower Extremities ROM: Adequate bilaterally Strength: 5/5 for all flexors and extensors of the lower extremity, bilaterally Pulses: Palpable bilaterally Neurologic: No allodynia, No hyperesthesia, No hyperpathia, No sensory abnormalities and No antalgic gait or posture  Assessment:  Encounter Diagnosis:  Primary Diagnosis: Chronic pain syndrome  [G89.4]  Plan:   Interventional Therapies: PRN procedure: Left palliative L5-S1 lumbar epidural steroid injection plus right paravertebral trigger point injection under fluoroscopic guidance and IV sedation.    Jacqueline Thornton was seen today for back pain and leg pain.  Diagnoses and all orders for this visit:  Chronic pain syndrome  Chronic pain -     oxyCODONE-acetaminophen (PERCOCET) 7.5-325 MG tablet; Take 1 tablet by mouth 5 (five) times daily. -     oxyCODONE-acetaminophen (PERCOCET) 7.5-325 MG tablet; Take 1 tablet by mouth 5 (five) times daily as needed for severe pain. -     oxyCODONE-acetaminophen (PERCOCET) 7.5-325 MG tablet; Take 1 tablet by mouth every 5 (five) minutes as needed for moderate pain or severe pain.  Failed back surgical syndrome  Uncomplicated opioid dependence ([place])  Opiate use  Long term current use of opiate analgesic -     Drugs of abuse screen w/o alc, rtn urine-sln  Encounter for therapeutic drug level monitoring  Lumbar radicular pain -     LUMBAR EPIDURAL STEROID INJECTION; Future -     LUMBAR EPIDURAL STEROID INJECTION; Standing  Myofascial pain syndrome  Muscle ache -     methocarbamol (ROBAXIN) 750 MG tablet; Take 1 tablet (750 mg total) by mouth 4 (four) times daily. -     TRIGGER POINT INJECTION; Standing   Lumbar annular disc tear (L4-5)  Chronic sacroiliac joint pain  Discogenic low back pain (L3-4 and L4-5)  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)  Chronic lumbar radicular pain  Cervical spinal fusion (C6-7 interbody fusion)  Long term prescription opiate use  Encounter for chronic pain management     Patient Instructions   GENERAL RISKS AND COMPLICATIONS  What are the risk, side effects and possible complications? Generally speaking, most procedures are safe.  However, with any procedure there are risks,  side effects, and the possibility of complications.  The risks and complications are dependent upon the sites that are  lesioned, or the type of nerve block to be performed.  The closer the procedure is to the spine, the more serious the risks are.  Great care is taken when placing the radio frequency needles, block needles or lesioning probes, but sometimes complications can occur. 1. Infection: Any time there is an injection through the skin, there is a risk of infection.  This is why sterile conditions are used for these blocks.  There are four possible types of infection. 1. Localized skin infection. 2. Central Nervous System Infection-This can be in the form of Meningitis, which can be deadly. 3. Epidural Infections-This can be in the form of an epidural abscess, which can cause pressure inside of the spine, causing compression of the spinal cord with subsequent paralysis. This would require an emergency surgery to decompress, and there are no guarantees that the patient would recover from the paralysis. 4. Discitis-This is an infection of the intervertebral discs.  It occurs in about 1% of discography procedures.  It is difficult to treat and it may lead to surgery.        2. Pain: the needles have to go through skin and soft tissues, will cause soreness.       3. Damage to internal structures:  The nerves to be lesioned may be near blood vessels or    other nerves which can be potentially damaged.       4. Bleeding: Bleeding is more common if the patient is taking blood thinners such as  aspirin, Coumadin, Ticiid, Plavix, etc., or if he/she have some genetic predisposition  such as hemophilia. Bleeding into the spinal canal can cause compression of the spinal  cord with subsequent paralysis.  This would require an emergency surgery to  decompress and there are no guarantees that the patient would recover from the  paralysis.       5. Pneumothorax:  Puncturing of a lung is a possibility, every time a needle is introduced in  the area of the chest or upper back.  Pneumothorax refers to free air around the  collapsed  lung(s), inside of the thoracic cavity (chest cavity).  Another two possible  complications related to a similar event would include: Hemothorax and Chylothorax.   These are variations of the Pneumothorax, where instead of air around the collapsed  lung(s), you may have blood or chyle, respectively.       6. Spinal headaches: They may occur with any procedures in the area of the spine.       7. Persistent CSF (Cerebro-Spinal Fluid) leakage: This is a rare problem, but may occur  with prolonged intrathecal or epidural catheters either due to the formation of a fistulous  track or a dural tear.       8. Nerve damage: By working so close to the spinal cord, there is always a possibility of  nerve damage, which could be as serious as a permanent spinal cord injury with  paralysis.       9. Death:  Although rare, severe deadly allergic reactions known as "Anaphylactic  reaction" can occur to any of the medications used.      10. Worsening of the symptoms:  We can always make thing worse.  What are the chances of something like this happening? Chances of any of this occuring are extremely low.  By statistics, you have more of a  chance of getting killed in a motor vehicle accident: while driving to the hospital than any of the above occurring .  Nevertheless, you should be aware that they are possibilities.  In general, it is similar to taking a shower.  Everybody knows that you can slip, hit your head and get killed.  Does that mean that you should not shower again?  Nevertheless always keep in mind that statistics do not mean anything if you happen to be on the wrong side of them.  Even if a procedure has a 1 (one) in a 1,000,000 (million) chance of going wrong, it you happen to be that one..Also, keep in mind that by statistics, you have more of a chance of having something go wrong when taking medications.  Who should not have this procedure? If you are on a blood thinning medication (e.g. Coumadin, Plavix,  see list of "Blood Thinners"), or if you have an active infection going on, you should not have the procedure.  If you are taking any blood thinners, please inform your physician.  How should I prepare for this procedure?  Do not eat or drink anything at least six hours prior to the procedure.  Bring a driver with you .  It cannot be a taxi.  Come accompanied by an adult that can drive you back, and that is strong enough to help you if your legs get weak or numb from the local anesthetic.  Take all of your medicines the morning of the procedure with just enough water to swallow them.  If you have diabetes, make sure that you are scheduled to have your procedure done first thing in the morning, whenever possible.  If you have diabetes, take only half of your insulin dose and notify our nurse that you have done so as soon as you arrive at the clinic.  If you are diabetic, but only take blood sugar pills (oral hypoglycemic), then do not take them on the morning of your procedure.  You may take them after you have had the procedure.  Do not take aspirin or any aspirin-containing medications, at least eleven (11) days prior to the procedure.  They may prolong bleeding.  Wear loose fitting clothing that may be easy to take off and that you would not mind if it got stained with Betadine or blood.  Do not wear any jewelry or perfume  Remove any nail coloring.  It will interfere with some of our monitoring equipment.  NOTE: Remember that this is not meant to be interpreted as a complete list of all possible complications.  Unforeseen problems may occur.  BLOOD THINNERS The following drugs contain aspirin or other products, which can cause increased bleeding during surgery and should not be taken for 2 weeks prior to and 1 week after surgery.  If you should need take something for relief of minor pain, you may take acetaminophen which is found in Tylenol,m Datril, Anacin-3 and Panadol. It is not  blood thinner. The products listed below are.  Do not take any of the products listed below in addition to any listed on your instruction sheet.  A.P.C or A.P.C with Codeine Codeine Phosphate Capsules #3 Ibuprofen Ridaura  ABC compound Congesprin Imuran rimadil  Advil Cope Indocin Robaxisal  Alka-Seltzer Effervescent Pain Reliever and Antacid Coricidin or Coricidin-D  Indomethacin Rufen  Alka-Seltzer plus Cold Medicine Cosprin Ketoprofen S-A-C Tablets  Anacin Analgesic Tablets or Capsules Coumadin Korlgesic Salflex  Anacin Extra Strength Analgesic tablets or capsules CP-2  Tablets Lanoril Salicylate  Anaprox Cuprimine Capsules Levenox Salocol  Anexsia-D Dalteparin Magan Salsalate  Anodynos Darvon compound Magnesium Salicylate Sine-off  Ansaid Dasin Capsules Magsal Sodium Salicylate  Anturane Depen Capsules Marnal Soma  APF Arthritis pain formula Dewitt's Pills Measurin Stanback  Argesic Dia-Gesic Meclofenamic Sulfinpyrazone  Arthritis Bayer Timed Release Aspirin Diclofenac Meclomen Sulindac  Arthritis pain formula Anacin Dicumarol Medipren Supac  Analgesic (Safety coated) Arthralgen Diffunasal Mefanamic Suprofen  Arthritis Strength Bufferin Dihydrocodeine Mepro Compound Suprol  Arthropan liquid Dopirydamole Methcarbomol with Aspirin Synalgos  ASA tablets/Enseals Disalcid Micrainin Tagament  Ascriptin Doan's Midol Talwin  Ascriptin A/D Dolene Mobidin Tanderil  Ascriptin Extra Strength Dolobid Moblgesic Ticlid  Ascriptin with Codeine Doloprin or Doloprin with Codeine Momentum Tolectin  Asperbuf Duoprin Mono-gesic Trendar  Aspergum Duradyne Motrin or Motrin IB Triminicin  Aspirin plain, buffered or enteric coated Durasal Myochrisine Trigesic  Aspirin Suppositories Easprin Nalfon Trillsate  Aspirin with Codeine Ecotrin Regular or Extra Strength Naprosyn Uracel  Atromid-S Efficin Naproxen Ursinus  Auranofin Capsules Elmiron Neocylate Vanquish  Axotal Emagrin Norgesic Verin  Azathioprine  Empirin or Empirin with Codeine Normiflo Vitamin E  Azolid Emprazil Nuprin Voltaren  Bayer Aspirin plain, buffered or children's or timed BC Tablets or powders Encaprin Orgaran Warfarin Sodium  Buff-a-Comp Enoxaparin Orudis Zorpin  Buff-a-Comp with Codeine Equegesic Os-Cal-Gesic   Buffaprin Excedrin plain, buffered or Extra Strength Oxalid   Bufferin Arthritis Strength Feldene Oxphenbutazone   Bufferin plain or Extra Strength Feldene Capsules Oxycodone with Aspirin   Bufferin with Codeine Fenoprofen Fenoprofen Pabalate or Pabalate-SF   Buffets II Flogesic Panagesic   Buffinol plain or Extra Strength Florinal or Florinal with Codeine Panwarfarin   Buf-Tabs Flurbiprofen Penicillamine   Butalbital Compound Four-way cold tablets Penicillin   Butazolidin Fragmin Pepto-Bismol   Carbenicillin Geminisyn Percodan   Carna Arthritis Reliever Geopen Persantine   Carprofen Gold's salt Persistin   Chloramphenicol Goody's Phenylbutazone   Chloromycetin Haltrain Piroxlcam   Clmetidine heparin Plaquenil   Cllnoril Hyco-pap Ponstel   Clofibrate Hydroxy chloroquine Propoxyphen         Before stopping any of these medications, be sure to consult the physician who ordered them.  Some, such as Coumadin (Warfarin) are ordered to prevent or treat serious conditions such as "deep thrombosis", "pumonary embolisms", and other heart problems.  The amount of time that you may need off of the medication may also vary with the medication and the reason for which you were taking it.  If you are taking any of these medications, please make sure you notify your pain physician before you undergo any procedures.         Epidural Steroid Injection Patient Information  Description: The epidural space surrounds the nerves as they exit the spinal cord.  In some patients, the nerves can be compressed and inflamed by a bulging disc or a tight spinal canal (spinal stenosis).  By injecting steroids into the epidural space,  we can bring irritated nerves into direct contact with a potentially helpful medication.  These steroids act directly on the irritated nerves and can reduce swelling and inflammation which often leads to decreased pain.  Epidural steroids may be injected anywhere along the spine and from the neck to the low back depending upon the location of your pain.   After numbing the skin with local anesthetic (like Novocaine), a small needle is passed into the epidural space slowly.  You may experience a sensation of pressure while this is being done.  The entire block usually last less than  10 minutes.  Conditions which may be treated by epidural steroids:   Low back and leg pain  Neck and arm pain  Spinal stenosis  Post-laminectomy syndrome  Herpes zoster (shingles) pain  Pain from compression fractures  Preparation for the injection:  1. Do not eat any solid food or dairy products within 6 hours of your appointment.  2. You may drink clear liquids up to 2 hours before appointment.  Clear liquids include water, black coffee, juice or soda.  No milk or cream please. 3. You may take your regular medication, including pain medications, with a sip of water before your appointment  Diabetics should hold regular insulin (if taken separately) and take 1/2 normal NPH dos the morning of the procedure.  Carry some sugar containing items with you to your appointment. 4. A driver must accompany you and be prepared to drive you home after your procedure.  5. Bring all your current medications with your. 6. An IV may be inserted and sedation may be given at the discretion of the physician.   7. A blood pressure cuff, EKG and other monitors will often be applied during the procedure.  Some patients may need to have extra oxygen administered for a short period. 8. You will be asked to provide medical information, including your allergies, prior to the procedure.  We must know immediately if you are taking blood  thinners (like Coumadin/Warfarin)  Or if you are allergic to IV iodine contrast (dye). We must know if you could possible be pregnant.  Possible side-effects:  Bleeding from needle site  Infection (rare, may require surgery)  Nerve injury (rare)  Numbness & tingling (temporary)  Difficulty urinating (rare, temporary)  Spinal headache ( a headache worse with upright posture)  Light -headedness (temporary)  Pain at injection site (several days)  Decreased blood pressure (temporary)  Weakness in arm/leg (temporary)  Pressure sensation in back/neck (temporary)  Call if you experience:  Fever/chills associated with headache or increased back/neck pain.  Headache worsened by an upright position.  New onset weakness or numbness of an extremity below the injection site  Hives or difficulty breathing (go to the emergency room)  Inflammation or drainage at the infection site  Severe back/neck pain  Any new symptoms which are concerning to you  Please note:  Although the local anesthetic injected can often make your back or neck feel good for several hours after the injection, the pain will likely return.  It takes 3-7 days for steroids to work in the epidural space.  You may not notice any pain relief for at least that one week.  If effective, we will often do a series of three injections spaced 3-6 weeks apart to maximally decrease your pain.  After the initial series, we generally will wait several months before considering a repeat injection of the same type.  If you have any questions, please call [phone] [facility]   Medications discontinued today:  Medications Discontinued During This Encounter  Medication Reason  . fentaNYL (SUBLIMAZE) injection 100 mcg   . iohexol (OMNIPAQUE) 180 MG/ML injection 10 mL   . lidocaine (PF) (XYLOCAINE) 1 % injection 10 mL   . midazolam (VERSED) 5 MG/5ML injection 5 mg   . ropivacaine (PF) 2  mg/ml (0.2%) (NAROPIN) epidural 2 mL   . sodium chloride 0.9 % injection 2 mL   . triamcinolone acetonide (KENALOG-40) injection 40 mg   . methylPREDNISolone acetate (DEPO-MEDROL) injection 40 mg   .  ropivacaine (PF) 2 mg/ml (0.2%) (NAROPIN) epidural 9 mL   . Cholecalciferol (VITAMIN D3) 1000 units CAPS Error  . DULoxetine (CYMBALTA) 60 MG capsule Error  . enalapril (VASOTEC) 5 MG tablet Error  . estradiol (VIVELLE-DOT) 0.05 MG/24HR patch Error  . fenofibrate 54 MG tablet Error  . gabapentin (NEURONTIN) 300 MG capsule Error  . LORazepam (ATIVAN) 0.5 MG tablet Error  . methocarbamol (ROBAXIN) 750 MG tablet Error  . oxyCODONE-acetaminophen (PERCOCET) 7.5-325 MG tablet Error  . oxyCODONE-acetaminophen (PERCOCET) 7.5-325 MG tablet Reorder  . oxyCODONE-acetaminophen (PERCOCET) 7.5-325 MG tablet Reorder  . methocarbamol (ROBAXIN) 750 MG tablet Reorder   Medications administered today:  Jacqueline Thornton had no medications administered during this visit.  Primary Care Physician: Lavera Guise, MD Location: [facility] Note by: Kathlen Brunswick Dossie Arbour, M.D, DABA, DABAPM, DABPM, DABIPP, FIPP

## 2015-07-06 NOTE — Patient Instructions (Signed)
GENERAL RISKS AND COMPLICATIONS  What are the risk, side effects and possible complications? Generally speaking, most procedures are safe.  However, with any procedure there are risks, side effects, and the possibility of complications.  The risks and complications are dependent upon the sites that are lesioned, or the type of nerve block to be performed.  The closer the procedure is to the spine, the more serious the risks are.  Great care is taken when placing the radio frequency needles, block needles or lesioning probes, but sometimes complications can occur. 1. Infection: Any time there is an injection through the skin, there is a risk of infection.  This is why sterile conditions are used for these blocks.  There are four possible types of infection. 1. Localized skin infection. 2. Central Nervous System Infection-This can be in the form of Meningitis, which can be deadly. 3. Epidural Infections-This can be in the form of an epidural abscess, which can cause pressure inside of the spine, causing compression of the spinal cord with subsequent paralysis. This would require an emergency surgery to decompress, and there are no guarantees that the patient would recover from the paralysis. 4. Discitis-This is an infection of the intervertebral discs.  It occurs in about 1% of discography procedures.  It is difficult to treat and it may lead to surgery.        2. Pain: the needles have to go through skin and soft tissues, will cause soreness.       3. Damage to internal structures:  The nerves to be lesioned may be near blood vessels or    other nerves which can be potentially damaged.       4. Bleeding: Bleeding is more common if the patient is taking blood thinners such as  aspirin, Coumadin, Ticiid, Plavix, etc., or if he/she have some genetic predisposition  such as hemophilia. Bleeding into the spinal canal can cause compression of the spinal  cord with subsequent paralysis.  This would require an  emergency surgery to  decompress and there are no guarantees that the patient would recover from the  paralysis.       5. Pneumothorax:  Puncturing of a lung is a possibility, every time a needle is introduced in  the area of the chest or upper back.  Pneumothorax refers to free air around the  collapsed lung(s), inside of the thoracic cavity (chest cavity).  Another two possible  complications related to a similar event would include: Hemothorax and Chylothorax.   These are variations of the Pneumothorax, where instead of air around the collapsed  lung(s), you may have blood or chyle, respectively.       6. Spinal headaches: They may occur with any procedures in the area of the spine.       7. Persistent CSF (Cerebro-Spinal Fluid) leakage: This is a rare problem, but may occur  with prolonged intrathecal or epidural catheters either due to the formation of a fistulous  track or a dural tear.       8. Nerve damage: By working so close to the spinal cord, there is always a possibility of  nerve damage, which could be as serious as a permanent spinal cord injury with  paralysis.       9. Death:  Although rare, severe deadly allergic reactions known as "Anaphylactic  reaction" can occur to any of the medications used.      10. Worsening of the symptoms:  We can always make thing worse.    What are the chances of something like this happening? Chances of any of this occuring are extremely low.  By statistics, you have more of a chance of getting killed in a motor vehicle accident: while driving to the hospital than any of the above occurring .  Nevertheless, you should be aware that they are possibilities.  In general, it is similar to taking a shower.  Everybody knows that you can slip, hit your head and get killed.  Does that mean that you should not shower again?  Nevertheless always keep in mind that statistics do not mean anything if you happen to be on the wrong side of them.  Even if a procedure has a 1  (one) in a 1,000,000 (million) chance of going wrong, it you happen to be that one..Also, keep in mind that by statistics, you have more of a chance of having something go wrong when taking medications.  Who should not have this procedure? If you are on a blood thinning medication (e.g. Coumadin, Plavix, see list of "Blood Thinners"), or if you have an active infection going on, you should not have the procedure.  If you are taking any blood thinners, please inform your physician.  How should I prepare for this procedure?  Do not eat or drink anything at least six hours prior to the procedure.  Bring a driver with you .  It cannot be a taxi.  Come accompanied by an adult that can drive you back, and that is strong enough to help you if your legs get weak or numb from the local anesthetic.  Take all of your medicines the morning of the procedure with just enough water to swallow them.  If you have diabetes, make sure that you are scheduled to have your procedure done first thing in the morning, whenever possible.  If you have diabetes, take only half of your insulin dose and notify our nurse that you have done so as soon as you arrive at the clinic.  If you are diabetic, but only take blood sugar pills (oral hypoglycemic), then do not take them on the morning of your procedure.  You may take them after you have had the procedure.  Do not take aspirin or any aspirin-containing medications, at least eleven (11) days prior to the procedure.  They may prolong bleeding.  Wear loose fitting clothing that may be easy to take off and that you would not mind if it got stained with Betadine or blood.  Do not wear any jewelry or perfume  Remove any nail coloring.  It will interfere with some of our monitoring equipment.  NOTE: Remember that this is not meant to be interpreted as a complete list of all possible complications.  Unforeseen problems may occur.  BLOOD THINNERS The following drugs  contain aspirin or other products, which can cause increased bleeding during surgery and should not be taken for 2 weeks prior to and 1 week after surgery.  If you should need take something for relief of minor pain, you may take acetaminophen which is found in Tylenol,m Datril, Anacin-3 and Panadol. It is not blood thinner. The products listed below are.  Do not take any of the products listed below in addition to any listed on your instruction sheet.  A.P.C or A.P.C with Codeine Codeine Phosphate Capsules #3 Ibuprofen Ridaura  ABC compound Congesprin Imuran rimadil  Advil Cope Indocin Robaxisal  Alka-Seltzer Effervescent Pain Reliever and Antacid Coricidin or Coricidin-D  Indomethacin Rufen    Alka-Seltzer plus Cold Medicine Cosprin Ketoprofen S-A-C Tablets  Anacin Analgesic Tablets or Capsules Coumadin Korlgesic Salflex  Anacin Extra Strength Analgesic tablets or capsules CP-2 Tablets Lanoril Salicylate  Anaprox Cuprimine Capsules Levenox Salocol  Anexsia-D Dalteparin Magan Salsalate  Anodynos Darvon compound Magnesium Salicylate Sine-off  Ansaid Dasin Capsules Magsal Sodium Salicylate  Anturane Depen Capsules Marnal Soma  APF Arthritis pain formula Dewitt's Pills Measurin Stanback  Argesic Dia-Gesic Meclofenamic Sulfinpyrazone  Arthritis Bayer Timed Release Aspirin Diclofenac Meclomen Sulindac  Arthritis pain formula Anacin Dicumarol Medipren Supac  Analgesic (Safety coated) Arthralgen Diffunasal Mefanamic Suprofen  Arthritis Strength Bufferin Dihydrocodeine Mepro Compound Suprol  Arthropan liquid Dopirydamole Methcarbomol with Aspirin Synalgos  ASA tablets/Enseals Disalcid Micrainin Tagament  Ascriptin Doan's Midol Talwin  Ascriptin A/D Dolene Mobidin Tanderil  Ascriptin Extra Strength Dolobid Moblgesic Ticlid  Ascriptin with Codeine Doloprin or Doloprin with Codeine Momentum Tolectin  Asperbuf Duoprin Mono-gesic Trendar  Aspergum Duradyne Motrin or Motrin IB Triminicin  Aspirin  plain, buffered or enteric coated Durasal Myochrisine Trigesic  Aspirin Suppositories Easprin Nalfon Trillsate  Aspirin with Codeine Ecotrin Regular or Extra Strength Naprosyn Uracel  Atromid-S Efficin Naproxen Ursinus  Auranofin Capsules Elmiron Neocylate Vanquish  Axotal Emagrin Norgesic Verin  Azathioprine Empirin or Empirin with Codeine Normiflo Vitamin E  Azolid Emprazil Nuprin Voltaren  Bayer Aspirin plain, buffered or children's or timed BC Tablets or powders Encaprin Orgaran Warfarin Sodium  Buff-a-Comp Enoxaparin Orudis Zorpin  Buff-a-Comp with Codeine Equegesic Os-Cal-Gesic   Buffaprin Excedrin plain, buffered or Extra Strength Oxalid   Bufferin Arthritis Strength Feldene Oxphenbutazone   Bufferin plain or Extra Strength Feldene Capsules Oxycodone with Aspirin   Bufferin with Codeine Fenoprofen Fenoprofen Pabalate or Pabalate-SF   Buffets II Flogesic Panagesic   Buffinol plain or Extra Strength Florinal or Florinal with Codeine Panwarfarin   Buf-Tabs Flurbiprofen Penicillamine   Butalbital Compound Four-way cold tablets Penicillin   Butazolidin Fragmin Pepto-Bismol   Carbenicillin Geminisyn Percodan   Carna Arthritis Reliever Geopen Persantine   Carprofen Gold's salt Persistin   Chloramphenicol Goody's Phenylbutazone   Chloromycetin Haltrain Piroxlcam   Clmetidine heparin Plaquenil   Cllnoril Hyco-pap Ponstel   Clofibrate Hydroxy chloroquine Propoxyphen         Before stopping any of these medications, be sure to consult the physician who ordered them.  Some, such as Coumadin (Warfarin) are ordered to prevent or treat serious conditions such as "deep thrombosis", "pumonary embolisms", and other heart problems.  The amount of time that you may need off of the medication may also vary with the medication and the reason for which you were taking it.  If you are taking any of these medications, please make sure you notify your pain physician before you undergo any  procedures.         Epidural Steroid Injection Patient Information  Description: The epidural space surrounds the nerves as they exit the spinal cord.  In some patients, the nerves can be compressed and inflamed by a bulging disc or a tight spinal canal (spinal stenosis).  By injecting steroids into the epidural space, we can bring irritated nerves into direct contact with a potentially helpful medication.  These steroids act directly on the irritated nerves and can reduce swelling and inflammation which often leads to decreased pain.  Epidural steroids may be injected anywhere along the spine and from the neck to the low back depending upon the location of your pain.   After numbing the skin with local anesthetic (like Novocaine), a small needle is passed   into the epidural space slowly.  You may experience a sensation of pressure while this is being done.  The entire block usually last less than 10 minutes.  Conditions which may be treated by epidural steroids:   Low back and leg pain  Neck and arm pain  Spinal stenosis  Post-laminectomy syndrome  Herpes zoster (shingles) pain  Pain from compression fractures  Preparation for the injection:  1. Do not eat any solid food or dairy products within 6 hours of your appointment.  2. You may drink clear liquids up to 2 hours before appointment.  Clear liquids include water, black coffee, juice or soda.  No milk or cream please. 3. You may take your regular medication, including pain medications, with a sip of water before your appointment  Diabetics should hold regular insulin (if taken separately) and take 1/2 normal NPH dos the morning of the procedure.  Carry some sugar containing items with you to your appointment. 4. A driver must accompany you and be prepared to drive you home after your procedure.  5. Bring all your current medications with your. 6. An IV may be inserted and sedation may be given at the discretion of the  physician.   7. A blood pressure cuff, EKG and other monitors will often be applied during the procedure.  Some patients may need to have extra oxygen administered for a short period. 8. You will be asked to provide medical information, including your allergies, prior to the procedure.  We must know immediately if you are taking blood thinners (like Coumadin/Warfarin)  Or if you are allergic to IV iodine contrast (dye). We must know if you could possible be pregnant.  Possible side-effects:  Bleeding from needle site  Infection (rare, may require surgery)  Nerve injury (rare)  Numbness & tingling (temporary)  Difficulty urinating (rare, temporary)  Spinal headache ( a headache worse with upright posture)  Light -headedness (temporary)  Pain at injection site (several days)  Decreased blood pressure (temporary)  Weakness in arm/leg (temporary)  Pressure sensation in back/neck (temporary)  Call if you experience:  Fever/chills associated with headache or increased back/neck pain.  Headache worsened by an upright position.  New onset weakness or numbness of an extremity below the injection site  Hives or difficulty breathing (go to the emergency room)  Inflammation or drainage at the infection site  Severe back/neck pain  Any new symptoms which are concerning to you  Please note:  Although the local anesthetic injected can often make your back or neck feel good for several hours after the injection, the pain will likely return.  It takes 3-7 days for steroids to work in the epidural space.  You may not notice any pain relief for at least that one week.  If effective, we will often do a series of three injections spaced 3-6 weeks apart to maximally decrease your pain.  After the initial series, we generally will wait several months before considering a repeat injection of the same type.  If you have any questions, please call [phone] [facility] 

## 2015-07-06 NOTE — Progress Notes (Signed)
Safety precautions to be maintained throughout the outpatient stay will include: orient to surroundings, keep bed in low position, maintain call bell within reach at all times, provide assistance with transfer out of bed and ambulation. Oxycodone pill count # 7 

## 2015-07-12 ENCOUNTER — Encounter: Payer: Medicare PPO | Admitting: Pain Medicine

## 2015-07-14 LAB — TOXASSURE SELECT 13 (MW), URINE: PDF: 0

## 2015-07-19 ENCOUNTER — Ambulatory Visit: Payer: Medicare PPO | Admitting: Anesthesiology

## 2015-07-19 ENCOUNTER — Encounter
Admission: RE | Disposition: A | Discharge status: Home / Self Care | Payer: Self-pay | Source: Ambulatory Visit | Attending: Unknown Physician Specialty

## 2015-07-19 ENCOUNTER — Encounter: Payer: Self-pay | Admitting: Unknown Physician Specialty

## 2015-07-19 ENCOUNTER — Ambulatory Visit
Admission: RE | Admit: 2015-07-19 | Discharge: 2015-07-19 | Disposition: A | Discharge status: Home / Self Care | Payer: Medicare PPO | Source: Ambulatory Visit | Attending: Unknown Physician Specialty | Admitting: Unknown Physician Specialty

## 2015-07-19 ENCOUNTER — Encounter: Payer: Medicare PPO | Admitting: Pain Medicine

## 2015-07-19 DIAGNOSIS — M1990 Unspecified osteoarthritis, unspecified site: Secondary | ICD-10-CM | POA: Diagnosis not present

## 2015-07-19 DIAGNOSIS — F329 Major depressive disorder, single episode, unspecified: Secondary | ICD-10-CM | POA: Insufficient documentation

## 2015-07-19 DIAGNOSIS — E785 Hyperlipidemia, unspecified: Secondary | ICD-10-CM | POA: Insufficient documentation

## 2015-07-19 DIAGNOSIS — F419 Anxiety disorder, unspecified: Secondary | ICD-10-CM | POA: Insufficient documentation

## 2015-07-19 DIAGNOSIS — Z79899 Other long term (current) drug therapy: Secondary | ICD-10-CM | POA: Insufficient documentation

## 2015-07-19 DIAGNOSIS — Z800 Family history of malignant neoplasm of digestive organs: Secondary | ICD-10-CM | POA: Diagnosis not present

## 2015-07-19 DIAGNOSIS — M797 Fibromyalgia: Secondary | ICD-10-CM | POA: Diagnosis not present

## 2015-07-19 DIAGNOSIS — G8929 Other chronic pain: Secondary | ICD-10-CM | POA: Insufficient documentation

## 2015-07-19 DIAGNOSIS — K219 Gastro-esophageal reflux disease without esophagitis: Secondary | ICD-10-CM | POA: Diagnosis not present

## 2015-07-19 DIAGNOSIS — Z9049 Acquired absence of other specified parts of digestive tract: Secondary | ICD-10-CM | POA: Diagnosis not present

## 2015-07-19 DIAGNOSIS — Z981 Arthrodesis status: Secondary | ICD-10-CM | POA: Diagnosis not present

## 2015-07-19 DIAGNOSIS — J45909 Unspecified asthma, uncomplicated: Secondary | ICD-10-CM | POA: Insufficient documentation

## 2015-07-19 DIAGNOSIS — K640 First degree hemorrhoids: Secondary | ICD-10-CM | POA: Diagnosis not present

## 2015-07-19 DIAGNOSIS — Z90710 Acquired absence of both cervix and uterus: Secondary | ICD-10-CM | POA: Diagnosis not present

## 2015-07-19 DIAGNOSIS — M549 Dorsalgia, unspecified: Secondary | ICD-10-CM | POA: Diagnosis not present

## 2015-07-19 DIAGNOSIS — I10 Essential (primary) hypertension: Secondary | ICD-10-CM | POA: Insufficient documentation

## 2015-07-19 DIAGNOSIS — Z1211 Encounter for screening for malignant neoplasm of colon: Secondary | ICD-10-CM | POA: Insufficient documentation

## 2015-07-19 DIAGNOSIS — I2510 Atherosclerotic heart disease of native coronary artery without angina pectoris: Secondary | ICD-10-CM | POA: Diagnosis not present

## 2015-07-19 DIAGNOSIS — Z87891 Personal history of nicotine dependence: Secondary | ICD-10-CM | POA: Insufficient documentation

## 2015-07-19 HISTORY — PX: COLONOSCOPY WITH PROPOFOL: SHX5780

## 2015-07-19 SURGERY — COLONOSCOPY WITH PROPOFOL
Anesthesia: General

## 2015-07-19 MED ORDER — SODIUM CHLORIDE 0.9 % IV SOLN
INTRAVENOUS | Status: DC
  Administered 2015-07-19: 1000 mL via INTRAVENOUS

## 2015-07-19 MED ORDER — MIDAZOLAM HCL 2 MG/2ML IJ SOLN: INTRAMUSCULAR | Status: DC | PRN

## 2015-07-19 MED ORDER — FENTANYL CITRATE (PF) 100 MCG/2ML IJ SOLN: INTRAMUSCULAR | Status: DC | PRN

## 2015-07-19 MED ORDER — PROPOFOL 10 MG/ML IV BOLUS
INTRAVENOUS | Status: DC | PRN
  Administered 2015-07-19 (×2): 20 mg via INTRAVENOUS
  Administered 2015-07-19: 10 mg via INTRAVENOUS
  Administered 2015-07-19 (×4): 20 mg via INTRAVENOUS

## 2015-07-19 MED ORDER — CEFAZOLIN SODIUM-DEXTROSE 2-3 GM-% IV SOLR
2.0000 g | INTRAVENOUS | Status: DC
  Filled 2015-07-19: qty 50

## 2015-07-19 MED ORDER — PROPOFOL 500 MG/50ML IV EMUL: INTRAVENOUS | Status: DC | PRN

## 2015-07-19 NOTE — Anesthesia Postprocedure Evaluation (Signed)
Anesthesia Post Note  Patient: Jacqueline Thornton  Procedure(s) Performed: Procedure(s) (LRB): COLONOSCOPY WITH PROPOFOL (N/A)  Patient location during evaluation: PACU Anesthesia Type: General Level of consciousness: awake and alert Pain management: pain level controlled Vital Signs Assessment: post-procedure vital signs reviewed and stable Respiratory status: spontaneous breathing, nonlabored ventilation, respiratory function stable and patient connected to nasal cannula oxygen Cardiovascular status: blood pressure returned to baseline and stable Postop Assessment: no signs of nausea or vomiting Anesthetic complications: no    Last Vitals:  Filed Vitals:   07/19/15 0946  BP: 98/48  Pulse: 76  Temp: 36.1 C  Resp: 16    Last Pain: There were no vitals filed for this visit.               Molli Barrows

## 2015-07-19 NOTE — Transfer of Care (Signed)
Immediate Anesthesia Transfer of Care Note  Patient: Jacqueline Thornton  Procedure(s) Performed: Procedure(s): COLONOSCOPY WITH PROPOFOL (N/A)  Patient Location: PACU and Endoscopy Unit  Anesthesia Type:General  Level of Consciousness: alert  and oriented  Airway & Oxygen Therapy: Patient Spontanous Breathing and Patient connected to nasal cannula oxygen  Post-op Assessment: Report given to RN and Post -op Vital signs reviewed and stable  Post vital signs: stable  Last Vitals:  Filed Vitals:   07/19/15 0946  BP: 98/48  Pulse: 76  Temp: 36.1 C  Resp: 16    Complications: No apparent anesthesia complications

## 2015-07-19 NOTE — Anesthesia Preprocedure Evaluation (Addendum)
Anesthesia Evaluation  Patient identified by MRN, date of birth, ID band Patient awake    Reviewed: Allergy & Precautions, H&P , NPO status , Patient's Chart, lab work & pertinent test results, reviewed documented beta blocker date and time   Airway Mallampati: II   Neck ROM: full    Dental  (+) Poor Dentition   Pulmonary neg pulmonary ROS, asthma , former smoker,    Pulmonary exam normal        Cardiovascular Exercise Tolerance: Poor hypertension, + angina + CAD  negative cardio ROS Normal cardiovascular exam     Neuro/Psych  Headaches, PSYCHIATRIC DISORDERS  Neuromuscular disease negative neurological ROS  negative psych ROS   GI/Hepatic negative GI ROS, Neg liver ROS, GERD  ,  Endo/Other  negative endocrine ROS  Renal/GU negative Renal ROS  negative genitourinary   Musculoskeletal   Abdominal   Peds  Hematology negative hematology ROS (+) anemia ,   Anesthesia Other Findings Past Medical History:   Anxiety                                                      Arthritis                                                    Depression                                                   GERD (gastroesophageal reflux disease)                       Hyperlipidemia                                               Asthma                                                       Migraine                                                     Chronic back pain                                            CAD (coronary artery disease)                                Angina pectoris ([place])  Spine disorder                                               Fibromyalgia                                                 Hypertension                                                 Arthropathy of sacroiliac joint                 04/11/2015   Low back pain                                   04/06/2015    Lumbar  radicular pain                           04/18/2015   Narrowing of intervertebral disc space          04/11/2015   Sacroiliac joint pain                           04/11/2015   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)         04/11/2015 Past Surgical History:   CHOLECYSTECTOMY                                               ABDOMINAL HYSTERECTOMY                                        NECK SURGERY                                    Bilateral              APPENDECTOMY                                                  NECK SURGERY                                                  SHOULDER SURGERY                                            BMI  Body Mass Index   27.64 kg/m 2     Reproductive/Obstetrics                             Anesthesia Physical Anesthesia Plan  ASA: III  Anesthesia Plan: General   Post-op Pain Management:    Induction:   Airway Management Planned:   Additional Equipment:   Intra-op Plan:   Post-operative Plan:   Informed Consent: I have reviewed the patients History and Physical, chart, labs and discussed the procedure including the risks, benefits and alternatives for the proposed anesthesia with the patient or authorized representative who has indicated his/her understanding and acceptance.   Dental Advisory Given  Plan Discussed with: CRNA  Anesthesia Plan Comments:         Anesthesia Quick Evaluation

## 2015-07-19 NOTE — Op Note (Signed)
[facility] Patient Name: Jacqueline Thornton Procedure Date: 07/19/2015 10:54 AM MRN: [id number] Account #: [id number] Date of Birth: January 24, 1951 Admit Type: Outpatient Age: 65 Room: [facility] ENDO ROOM 4 Gender: Female Note Status: Finalized Procedure:         Colonoscopy Indications:       Screening in patient at increased risk: Family history of                     1st-degree relative with colorectal cancer Providers:         Manya Silvas, MD Referring MD:      Lavera Guise, MD (Referring MD) Medicines:         Propofol per Anesthesia Complications:     No immediate complications. Procedure:         Pre-Anesthesia Assessment:                    - After reviewing the risks and benefits, the patient was                     deemed in satisfactory condition to undergo the procedure.                    After obtaining informed consent, the colonoscope was                     passed under direct vision. Throughout the procedure, the                     patient's blood pressure, pulse, and oxygen saturations                     were monitored continuously. The Colonoscope was                     introduced through the anus and advanced to the the cecum,                     identified by appendiceal orifice and ileocecal valve. The                     colonoscopy was performed without difficulty. The patient                     tolerated the procedure well. The quality of the bowel                     preparation was excellent. Findings:      Two sessile polyps were found in the rectum and in the transverse colon.       The polyps were diminutive in size. These polyps were removed with a       jumbo cold forceps. Resection and retrieval were complete.      Internal hemorrhoids were found during endoscopy. The hemorrhoids were       small and Grade I (internal hemorrhoids that do not prolapse).      The exam was otherwise without abnormality. Impression:         - Two diminutive polyps in the rectum and in the                     transverse colon. Resected and retrieved.                    -  Internal hemorrhoids.                    - The examination was otherwise normal. Recommendation:    - Await pathology results. Manya Silvas, MD 07/19/2015 11:27:26 AM This report has been signed electronically. Number of Addenda: 0 Note Initiated On: 07/19/2015 10:54 AM Scope Withdrawal Time: 0 hours 14 minutes 43 seconds  Total Procedure Duration: 0 hours 23 minutes 11 seconds       [facility]

## 2015-07-19 NOTE — Anesthesia Procedure Notes (Signed)
Date/Time: 07/19/2015 10:29 AM Performed by: Nelda Marseille Pre-anesthesia Checklist: Patient identified, Emergency Drugs available, Suction available, Patient being monitored and Timeout performed Oxygen Delivery Method: Nasal cannula

## 2015-07-19 NOTE — H&P (Signed)
Primary Care Physician:  Lavera Guise, MD Primary Gastroenterologist:  Dr. Vira Agar  Pre-Procedure History & Physical: HPI:  Jacqueline Thornton is a 65 y.o. female is here for an colonoscopy.   Past Medical History  Diagnosis Date  . Anxiety   . Arthritis   . Depression   . GERD (gastroesophageal reflux disease)   . Hyperlipidemia   . Asthma   . Migraine   . Chronic back pain   . CAD (coronary artery disease)   . Angina pectoris ([place])   . Spine disorder   . Fibromyalgia   . Hypertension   . 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 Low back pain 04/06/2015  . Lumbar radicular pain 04/18/2015  . Narrowing of intervertebral disc space 04/11/2015  . Sacroiliac joint pain 04/11/2015  . 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) 04/11/2015    Past Surgical History  Procedure Laterality Date  . Cholecystectomy    . Abdominal hysterectomy    . Neck surgery Bilateral   . Appendectomy    . Neck surgery    . Shoulder surgery      Prior to Admission medications   Medication Sig Start Date End Date Taking? Authorizing Provider  cholecalciferol (VITAMIN D) 400 UNITS TABS tablet Take 1,000 Units by mouth daily.   Yes Historical Provider, MD  DULoxetine (CYMBALTA) 60 MG capsule Take 30 mg by mouth daily.    Yes Historical Provider, MD  enalapril (VASOTEC) 5 MG tablet Take 5 mg by mouth daily.   Yes Historical Provider, MD  esomeprazole (NEXIUM) 20 MG capsule Take 20 mg by mouth daily at 12 noon.   Yes Historical Provider, MD  estradiol (ESTRACE) 0.5 MG tablet Take 0.5 mg by mouth daily.    Yes Historical Provider, MD  fenofibrate 54 MG tablet Take 54 mg by mouth daily. Reported on 07/06/2015   Yes Historical Provider, MD  gabapentin (NEURONTIN) 300 MG capsule Take 300 mg by mouth 2 (two) times daily.   Yes Historical Provider, MD  LORazepam (ATIVAN) 0.5 MG tablet Take 0.5 mg by mouth 2 (two) times daily.    Yes Historical Provider, MD  methocarbamol (ROBAXIN) 750 MG tablet Take 1  tablet (750 mg total) by mouth 4 (four) times daily. 07/06/15  Yes Milinda Pointer, MD  oxyCODONE-acetaminophen (PERCOCET) 7.5-325 MG tablet Take 1 tablet by mouth 5 (five) times daily. 07/06/15  Yes Milinda Pointer, MD  oxyCODONE-acetaminophen (PERCOCET) 7.5-325 MG tablet Take 1 tablet by mouth 5 (five) times daily as needed for severe pain. 07/06/15  Yes Milinda Pointer, MD  oxyCODONE-acetaminophen (PERCOCET) 7.5-325 MG tablet Take 1 tablet by mouth every 5 (five) minutes as needed for moderate pain or severe pain. 07/06/15  Yes Milinda Pointer, MD  pravastatin (PRAVACHOL) 20 MG tablet Take 20 mg by mouth every other day.   Yes Historical Provider, MD    Allergies as of 06/22/2015  . (No Known Allergies)    Family History  Problem Relation Age of Onset  . Diabetes Father   . Hypertension Sister   . Diabetes Mother   . Stroke Mother     Social History   Social History  . Marital Status: Married    Spouse Name: N/A  . Number of Children: N/A  . Years of Education: N/A   Occupational History  . Not on file.   Social History Main Topics  . Smoking status: Former Research scientist (life sciences)  . Smokeless tobacco: Not on file  . Alcohol Use: No  . Drug  Use: No  . Sexual Activity: Not on file   Other Topics Concern  . Not on file   Social History Narrative    Review of Systems: See HPI, otherwise negative ROS  Physical Exam: BP 98/48 mmHg  Pulse 76  Temp(Src) 97 F (36.1 C) (Tympanic)  Resp 16  Ht 5' 0.5" (1.537 m)  Wt 65.318 kg (144 lb)  BMI 27.65 kg/m2  SpO2 100% General:   Alert,  pleasant and cooperative in NAD Head:  Normocephalic and atraumatic. Neck:  Supple; no masses or thyromegaly. Lungs:  Clear throughout to auscultation.    Heart:  Regular rate and rhythm. Abdomen:  Soft, nontender and nondistended. Normal bowel sounds, without guarding, and without rebound.   Neurologic:  Alert and  oriented x4;  grossly normal neurologically.  Impression/Plan: Jacqueline Thornton is  here for an colonoscopy to be performed for FH colon cancer  Risks, benefits, limitations, and alternatives regarding  colonoscopy have been reviewed with the patient.  Questions have been answered.  All parties agreeable.   Gaylyn Cheers, MD  07/19/2015, 10:52 AM

## 2015-07-19 NOTE — OR Nursing (Signed)
Do not give ordered Ancef.  V.O.R.B     Dr. Garnet Sierras, RN

## 2015-07-20 LAB — SURGICAL PATHOLOGY

## 2015-07-27 ENCOUNTER — Encounter: Payer: Self-pay | Admitting: Pain Medicine

## 2015-07-27 ENCOUNTER — Ambulatory Visit: Payer: Medicare PPO | Attending: Pain Medicine | Admitting: Pain Medicine

## 2015-07-27 VITALS — BP 104/60 | HR 65 | Temp 97.8°F | Resp 16 | Ht 61.0 in | Wt 143.0 lb

## 2015-07-27 DIAGNOSIS — M533 Sacrococcygeal disorders, not elsewhere classified: Secondary | ICD-10-CM | POA: Insufficient documentation

## 2015-07-27 DIAGNOSIS — M961 Postlaminectomy syndrome, not elsewhere classified: Secondary | ICD-10-CM

## 2015-07-27 DIAGNOSIS — M539 Dorsopathy, unspecified: Secondary | ICD-10-CM | POA: Diagnosis not present

## 2015-07-27 DIAGNOSIS — G43909 Migraine, unspecified, not intractable, without status migrainosus: Secondary | ICD-10-CM | POA: Insufficient documentation

## 2015-07-27 DIAGNOSIS — F1120 Opioid dependence, uncomplicated: Secondary | ICD-10-CM | POA: Diagnosis not present

## 2015-07-27 DIAGNOSIS — M5416 Radiculopathy, lumbar region: Secondary | ICD-10-CM | POA: Diagnosis not present

## 2015-07-27 DIAGNOSIS — G8929 Other chronic pain: Secondary | ICD-10-CM | POA: Diagnosis not present

## 2015-07-27 DIAGNOSIS — M5126 Other intervertebral disc displacement, lumbar region: Secondary | ICD-10-CM | POA: Insufficient documentation

## 2015-07-27 DIAGNOSIS — I2510 Atherosclerotic heart disease of native coronary artery without angina pectoris: Secondary | ICD-10-CM | POA: Diagnosis not present

## 2015-07-27 DIAGNOSIS — M5116 Intervertebral disc disorders with radiculopathy, lumbar region: Secondary | ICD-10-CM | POA: Insufficient documentation

## 2015-07-27 DIAGNOSIS — Z981 Arthrodesis status: Secondary | ICD-10-CM | POA: Insufficient documentation

## 2015-07-27 DIAGNOSIS — D649 Anemia, unspecified: Secondary | ICD-10-CM | POA: Insufficient documentation

## 2015-07-27 DIAGNOSIS — M4726 Other spondylosis with radiculopathy, lumbar region: Secondary | ICD-10-CM | POA: Diagnosis not present

## 2015-07-27 DIAGNOSIS — F410 Panic disorder [episodic paroxysmal anxiety] without agoraphobia: Secondary | ICD-10-CM | POA: Diagnosis not present

## 2015-07-27 DIAGNOSIS — Z9889 Other specified postprocedural states: Secondary | ICD-10-CM | POA: Diagnosis not present

## 2015-07-27 DIAGNOSIS — M109 Gout, unspecified: Secondary | ICD-10-CM | POA: Diagnosis not present

## 2015-07-27 DIAGNOSIS — M545 Low back pain: Secondary | ICD-10-CM | POA: Diagnosis not present

## 2015-07-27 DIAGNOSIS — M546 Pain in thoracic spine: Secondary | ICD-10-CM | POA: Diagnosis present

## 2015-07-27 MED ORDER — FENTANYL CITRATE (PF) 100 MCG/2ML IJ SOLN: 100.0000 ug | Freq: Once | INTRAMUSCULAR | Status: DC

## 2015-07-27 MED ORDER — TRIAMCINOLONE ACETONIDE 40 MG/ML IJ SUSP
INTRAMUSCULAR | Status: AC
Start: 2015-07-27 — End: 2015-07-27
  Administered 2015-07-27: 11:00:00
  Filled 2015-07-27: qty 1

## 2015-07-27 MED ORDER — LACTATED RINGERS IV SOLN: 1000.0000 mL | INTRAVENOUS | Status: AC

## 2015-07-27 MED ORDER — FENTANYL CITRATE (PF) 100 MCG/2ML IJ SOLN
INTRAMUSCULAR | Status: AC
  Administered 2015-07-27: 100 ug via INTRAVENOUS
  Filled 2015-07-27: qty 2

## 2015-07-27 MED ORDER — ROPIVACAINE HCL 2 MG/ML IJ SOLN
INTRAMUSCULAR | Status: AC
Start: 2015-07-27 — End: 2015-07-27
  Administered 2015-07-27: 11:00:00
  Filled 2015-07-27: qty 10

## 2015-07-27 MED ORDER — MIDAZOLAM HCL 5 MG/5ML IJ SOLN: 5.0000 mg | Freq: Once | INTRAMUSCULAR | Status: DC

## 2015-07-27 MED ORDER — TRIAMCINOLONE ACETONIDE 40 MG/ML IJ SUSP: 40.0000 mg | Freq: Once | INTRAMUSCULAR | Status: DC

## 2015-07-27 MED ORDER — IOHEXOL 180 MG/ML  SOLN: 10.0000 mL | Freq: Once | INTRAMUSCULAR | Status: DC | PRN

## 2015-07-27 MED ORDER — SODIUM CHLORIDE 0.9% FLUSH: 2.0000 mL | Freq: Once | INTRAVENOUS | Status: DC

## 2015-07-27 MED ORDER — SODIUM CHLORIDE 0.9 % IJ SOLN
INTRAMUSCULAR | Status: AC
  Administered 2015-07-27: 11:00:00
  Filled 2015-07-27: qty 10

## 2015-07-27 MED ORDER — LIDOCAINE HCL (PF) 1 % IJ SOLN
INTRAMUSCULAR | Status: AC
  Administered 2015-07-27: 11:00:00
  Filled 2015-07-27: qty 5

## 2015-07-27 MED ORDER — IOHEXOL 180 MG/ML  SOLN
INTRAMUSCULAR | Status: AC
  Administered 2015-07-27: 11:00:00
  Filled 2015-07-27: qty 20

## 2015-07-27 MED ORDER — LIDOCAINE HCL (PF) 1 % IJ SOLN: 10.0000 mL | Freq: Once | INTRAMUSCULAR | Status: DC

## 2015-07-27 MED ORDER — MIDAZOLAM HCL 5 MG/5ML IJ SOLN
INTRAMUSCULAR | Status: AC
  Administered 2015-07-27: 4 mg via INTRAVENOUS
  Filled 2015-07-27: qty 5

## 2015-07-27 MED ORDER — ROPIVACAINE HCL 2 MG/ML IJ SOLN: 2.0000 mL | Freq: Once | INTRAMUSCULAR | Status: DC

## 2015-07-27 NOTE — Patient Instructions (Signed)
Pain Management Discharge Instructions  General Discharge Instructions :  If you need to reach your doctor call: Monday-Friday 8:00 am - 4:00 pm at [phone] or toll free [phone].  After clinic hours [phone] to have operator reach doctor.  Bring all of your medication bottles to all your appointments in the pain clinic.  To cancel or reschedule your appointment with Pain Management please remember to call 24 hours in advance to avoid a fee.  Refer to the educational materials which you have been given on: General Risks, I had my Procedure. Discharge Instructions, Post Sedation.  Post Procedure Instructions:  The drugs you were given will stay in your system until tomorrow, so for the next 24 hours you should not drive, make any legal decisions or drink any alcoholic beverages.  You may eat anything you prefer, but it is better to start with liquids then soups and crackers, and gradually work up to solid foods.  Please notify your doctor immediately if you have any unusual bleeding, trouble breathing or pain that is not related to your normal pain.  Depending on the type of procedure that was done, some parts of your body may feel week and/or numb.  This usually clears up by tonight or the next day.  Walk with the use of an assistive device or accompanied by an adult for the 24 hours.  You may use ice on the affected area for the first 24 hours.  Put ice in a Ziploc bag and cover with a towel and place against area 15 minutes on 15 minutes off.  You may switch to heat after 24 hours.Epidural Steroid Injection An epidural steroid injection is given to relieve pain in your neck, back, or legs that is caused by the irritation or swelling of a nerve root. This procedure involves injecting a steroid and numbing medicine (anesthetic) into the epidural space. The epidural space is the space between the outer covering of your spinal cord and the bones that form your backbone  (vertebra).  LET YOUR HEALTH CARE PROVIDER KNOW ABOUT:   Any allergies you have.  All medicines you are taking, including vitamins, herbs, eye drops, creams, and over-the-counter medicines such as aspirin.  Previous problems you or members of your family have had with the use of anesthetics.  Any blood disorders or blood clotting disorders you have.  Previous surgeries you have had.  Medical conditions you have. RISKS AND COMPLICATIONS Generally, this is a safe procedure. However, as with any procedure, complications can occur. Possible complications of epidural steroid injection include:  Headache.  Bleeding.  Infection.  Allergic reaction to the medicines.  Damage to your nerves. The response to this procedure depends on the underlying cause of the pain and its duration. People who have long-term (chronic) pain are less likely to benefit from epidural steroids than are those people whose pain comes on strong and suddenly. BEFORE THE PROCEDURE   Ask your health care provider about changing or stopping your regular medicines. You may be advised to stop taking blood-thinning medicines a few days before the procedure.  You may be given medicines to reduce anxiety.  Arrange for someone to take you home after the procedure. PROCEDURE   You will remain awake during the procedure. You may receive medicine to make you relaxed.  You will be asked to lie on your stomach.  The injection site will be cleaned.  The injection site will be numbed with a medicine (local anesthetic).  A needle will be   injected through your skin into the epidural space.  Your health care provider will use an X-ray machine to ensure that the steroid is delivered closest to the affected nerve. You may have minimal discomfort at this time.  Once the needle is in the right position, the local anesthetic and the steroid will be injected into the epidural space.  The needle will then be removed and a  bandage will be applied to the injection site. AFTER THE PROCEDURE   You may be monitored for a short time before you go home.  You may feel weakness or numbness in your arm or leg, which disappears within hours.  You may be allowed to eat, drink, and take your regular medicine.  You may have soreness at the site of the injection.   This information is not intended to replace advice given to you by your health care provider. Make sure you discuss any questions you have with your health care provider.   Document Released: 09/24/2007 Document Revised: 02/17/2013 Document Reviewed: 12/04/2012 Elsevier Interactive Patient Education 2016 [employer]  

## 2015-07-27 NOTE — Progress Notes (Signed)
Patient's Name: Jacqueline Thornton MRN: [id number] DOB: January 12, 1951 DOS: 07/27/2015  Primary Reason(s) for Visit: Interventional Pain Management Treatment. CC: Back Pain   Pre-Procedure Assessment:  Jacqueline Thornton is a 65 y.o. year old, female patient, seen today for interventional treatment. She has Chronic pain syndrome; Absolute anemia; Gout; HLD (hyperlipidemia); Headache, migraine; Panic attack; Chronic low back pain; Lumbar spondylosis;  Lumbar annular disc tear (L4-5); Discogenic low back pain (L3-4 and L4-5); Failed back surgical syndrome; Lumbar facet hypertrophy; Facet syndrome, lumbar; Chronic neck pain; Cervical spondylosis; Hx of cervical spine surgery; Cervical spinal fusion (C6-7 interbody fusion); Coronary atherosclerosis; Long term current use of opiate analgesic; Encounter for therapeutic drug level monitoring; Uncomplicated opioid dependence ([place]); Opiate use; Myofascial pain syndrome; CAD in native artery; Atypical face pain; Anxiety, generalized; Muscle ache; Chronic pain; Chronic sacroiliac joint pain; Chronic lumbar radicular pain (Left); Long term prescription opiate use; and Encounter for chronic pain management on her problem list.. Her primarily concern today is the Back Pain   Today's Initial Pain Score: 7/10 Reported level of pain is incompatible with clinical obrservations. This may be secondary to a possible lack of understanding on how the pain scale works. Pain Type: Chronic pain Pain Location: Back Pain Orientation: Lower, Left Pain Descriptors / Indicators: Aching, Constant Pain Frequency: Constant  Post-procedure Pain Score: 4   Date of Last Visit: 07/06/15 Service Provided on Last Visit: Med Refill  Verification of the correct person, correct site (including marking of site), and correct procedure were performed and confirmed by the patient.  Today's Vitals   07/27/15 1105 07/27/15 1110 07/27/15 1115 07/27/15 1120  BP: 90/51 89/52 102/51 104/60  Pulse: 63 65  64 65  Temp:      Resp: 18 16 18 16   Height:      Weight:      SpO2: 100% 100% 100% 100%  PainSc: 4  4     PainLoc:      Calculated BMI: Body mass index is 27.03 kg/(m^2). Allergies: She has No Known Allergies.. Primary Diagnosis: Chronic radicular lumbar pain [M54.16, G89.29]  Procedure:  Type: Palliative Inter-Laminar Epidural Steroid Injection Region: Lumbar Level: L5-S1 Level. Laterality: Left-Sided    Indications: 1. Chronic lumbar radicular pain (Left)   2. Lumbar radicular pain   3. Failed back surgical syndrome   4. Osteoarthritis of spine with radiculopathy, lumbar region     In addition, [name] has Chronic pain syndrome; Chronic low back pain; Lumbar spondylosis;  Lumbar annular disc tear (L4-5); Discogenic low back pain (L3-4 and L4-5); Failed back surgical syndrome; Lumbar facet hypertrophy; Facet syndrome, lumbar; Chronic neck pain; Cervical spondylosis; Hx of cervical spine surgery; Cervical spinal fusion (C6-7 interbody fusion); Myofascial pain syndrome; Atypical face pain; Muscle ache; Chronic pain; Chronic sacroiliac joint pain; and Chronic lumbar radicular pain (Left) on her pertinent problem list.  Consent: Secured. Under the influence of no sedatives a written informed consent was obtained, after having provided information on the risks and possible complications. To fulfill our ethical and legal obligations, as recommended by the American Medical Association's Code of Ethics, we have provided information to the patient about our clinical impression; the nature and purpose of the treatment or procedure; the risks, benefits, and possible complications of the intervention; alternatives; the risk(s) and benefit(s) of the alternative treatment(s) or procedure(s); and the risk(s) and benefit(s) of doing nothing. The patient was provided information about the risks and possible complications associated with the procedure. In the case of spinal procedures these  may  include, but are not limited to, failure to achieve desired goals, infection, bleeding, organ or nerve damage, allergic reactions, paralysis, and death. In addition, the patient was informed that Medicine is not an exact science; therefore, there is also the possibility of unforeseen risks and possible complications that may result in a catastrophic outcome. The patient indicated having understood very clearly. We have given the patient no guarantees and we have made no promises. Enough time was given to the patient to ask questions, all of which were answered to the patient's satisfaction.  Pre-Procedure Preparation: Safety Precautions: Allergies reviewed. Appropriate site, procedure, and patient were confirmed by following the Joint Commission's Universal Protocol (UP.01.01.01), in the form of a "Time Out". The patient was asked to confirm marked site and procedure, before commencing. The patient was asked about blood thinners, or active infections, both of which were denied. Patient was assessed for positional comfort and all pressure points were checked before starting procedure. Monitoring:  As per clinic protocol. Infection Control Precautions: Sterile technique used. Standard Universal Precautions were taken as recommended by the Department of [facility] for Disease Control and Prevention (CDC). Standard pre-surgical skin prep was conducted. Respiratory hygiene and cough etiquette was practiced. Hand hygiene observed. Safe injection practices and needle disposal techniques followed. SDV (single dose vial) medications used. Medications properly checked for expiration dates and contaminants. Personal protective equipment (PPE) used: Surgical mask. Sterile double glove technique. Radiation resistant gloves. Sterile surgical gloves.  Anesthesia, Analgesia, Anxiolysis: Type: Moderate (Conscious) Sedation & Local Anesthesia Local Anesthetic: Lidocaine 1% Route: Intravenous (IV) IV Access:  Secured Sedation: Meaningful verbal contact was maintained at all times during the procedure  Indication(s): Analgesia    Description of Procedure Process:  Time-out: "Time-out" completed before starting procedure, as per protocol. Position: Prone Target Area: For Epidural Steroid injections, the target area is the  interlaminar space, initially targeting the lower border of the superior vertebral body lamina. Approach: Posterior approach. Area Prepped: Entire Posterior Lumbosacral Region Prepping solution: ChloraPrep (2% chlorhexidine gluconate and 70% isopropyl alcohol) Safety Precautions: Aspiration looking for blood return was conducted prior to all injections. At no point did we inject any substances, as a needle was being advanced. No attempts were made at seeking any paresthesias. Safe injection practices and needle disposal techniques used. Medications properly checked for expiration dates. SDV (single dose vial) medications used.   Description of the Procedure: Protocol guidelines were followed. The patient was placed in position over the fluoroscopy table. The target area was identified and the area prepped in the usual manner. Skin desensitized using vapocoolant spray. Skin & deeper tissues infiltrated with local anesthetic. Appropriate amount of time allowed to pass for local anesthetics to take effect. The procedure needle was introduced through the skin, ipsilateral to the reported pain, and advanced to the target area. Bone was contacted and the needle walked caudad, until the lamina was cleared. The epidural space was identified using "loss-of-resistance technique" with 2-3 ml of PF-NaCl (0.9% NSS), in a 5cc LOR glass syringe. Proper needle placement secured. Negative aspiration confirmed. Solution injected in intermittent fashion, asking for systemic symptoms every 0.5cc of injectate. The needles were then removed and the area cleansed, making sure to leave some of the prepping solution  back to take advantage of its long term bactericidal properties. EBL: None Materials & Medications Used:  Needle(s) Used: 20g - 10cm, Tuohy-style epidural needle Medications Administered today: We administered iohexol, lidocaine (PF), sodium chloride, ropivacaine (PF) 2 mg/ml (0.2%), triamcinolone  acetonide, fentaNYL, and midazolam.Please see chart orders for dosing details.  Imaging Guidance:  Type of Imaging Technique: Fluoroscopy Guidance (Spinal) Indication(s): Assistance in needle guidance and placement for procedures requiring needle placement in or near specific anatomical locations not easily accessible without such assistance. Exposure Time: Please see nurses notes. Contrast: Before injecting any contrast, we confirmed that the patient did not have an allergy to iodine, shellfish, or radiological contrast. Once satisfactory needle placement was completed at the desired level, radiological contrast was injected. Injection was conducted under continuous fluoroscopic guidance. Injection of contrast accomplished without complications. See chart for type and volume of contrast used. Fluoroscopic Guidance: I was personally present in the fluoroscopy suite, where the patient was placed in position for the procedure, over the fluoroscopy-compatible table. Fluoroscopy was manipulated, using "Tunnel Vision Technique", to obtain the best possible view of the target area, on the affected side. Parallax error was corrected before commencing the procedure. A "direction-depth-direction" technique was used to introduce the needle under continuous pulsed fluoroscopic guidance. Once the target was reached, antero-posterior, oblique, and lateral fluoroscopic projection views were taken to confirm needle placement in all planes. Permanently recorded images stored by scanning into EMR. Interpretation: Intraoperative imaging interpretation by performing Physician. Adequate needle placement confirmed. Adequate needle  placement confirmed in AP, lateral, & Oblique Views. Appropriate spread of contrast to desired area. No evidence of afferent or efferent intravascular uptake. No intrathecal or subarachnoid spread observed. Permanent hardcopy images in multiple planes scanned into the patient's record.  Antibiotics:  Type:  Antibiotics Given (last 72 hours)    None      Indication(s): No indications identified.  Post-operative Assessment:  Complications: No immediate post-treatment complications were observed. Relevant Post-operative Information:  Disposition: Return to clinic for follow-up evaluation. The patient tolerated the entire procedure well. A repeat set of vitals were taken after the procedure and the patient was kept under observation following institutional policy, for this procedure. Post-procedural neurological assessment was performed, showing return to baseline, prior to discharge. The patient was discharged home, once institutional criteria were met. The patient was provided with post-procedure discharge instructions, including a section on how to identify potential problems. Should any problems arise concerning this procedure, the patient was given instructions to immediately contact us, at any time, without hesitation. In any case, we plan to contact the patient by telephone for a follow-up status report regarding this interventional procedure. Comments:  No additional relevant information.  Primary Care Physician: Lavera Guise, MD Location: [facility] Note by: Kathlen Brunswick Dossie Arbour, M.D, DABA, DABAPM, DABPM, DABIPP, FIPP  Disclaimer:  Medicine is not an exact science. The only guarantee in medicine is that nothing is guaranteed. It is important to note that the decision to proceed with this intervention was based on the information collected from the patient. The Data and conclusions were drawn from the patient's questionnaire, the interview, and the physical  examination. Because the information was provided in large part by the patient, it cannot be guaranteed that it has not been purposely or unconsciously manipulated. Every effort has been made to obtain as much relevant data as possible for this evaluation. It is important to note that the conclusions that lead to this procedure are derived in large part from the available data. Always take into account that the treatment will also be dependent on availability of resources and existing treatment guidelines, considered by other Pain Management Practitioners as being common knowledge and practice, at the time of the  intervention. For Medico-Legal purposes, it is also important to point out that variation in procedural techniques and pharmacological choices are the acceptable norm. The indications, contraindications, technique, and results of the above procedure should only be interpreted and judged by a Board-Certified Interventional Pain Specialist with extensive familiarity and expertise in the same exact procedure and technique. Attempts at providing opinions without similar or greater experience and expertise than that of the treating physician will be considered as inappropriate and unethical, and shall result in a formal complaint to the state medical board and applicable specialty societies.

## 2015-07-27 NOTE — Progress Notes (Signed)
Safety precautions to be maintained throughout the outpatient stay will include: orient to surroundings, keep bed in low position, maintain call bell within reach at all times, provide assistance with transfer out of bed and ambulation.  

## 2015-07-28 ENCOUNTER — Telehealth: Payer: Self-pay

## 2015-07-28 NOTE — Telephone Encounter (Signed)
Pt states she is a little sore today,  but knows it will get better- instructed to call if needed

## 2015-08-10 NOTE — Progress Notes (Signed)
Quick Note:  NOTE: This forensic urine drug screen (UDS) test was conducted using a state-of-the-art ultra high performance liquid chromatography and mass spectrometry system (UPLC/MS-MS), the most sophisticated and accurate method available. UPLC/MS-MS is 1,000 times more precise and accurate than standard gas chromatography and mass spectrometry (GC/MS). This system can analyze 26 drug categories and 180 drug compounds.  The results of this test came back with unexpected findings. Hydrocodone was found on the sample. ______

## 2015-08-23 ENCOUNTER — Encounter: Payer: Self-pay | Admitting: Pain Medicine

## 2015-08-23 ENCOUNTER — Ambulatory Visit: Payer: Medicare PPO | Attending: Pain Medicine | Admitting: Pain Medicine

## 2015-08-23 VITALS — BP 110/64 | HR 77 | Temp 97.8°F | Resp 16 | Ht 61.0 in | Wt 144.0 lb

## 2015-08-23 DIAGNOSIS — M549 Dorsalgia, unspecified: Secondary | ICD-10-CM | POA: Diagnosis present

## 2015-08-23 DIAGNOSIS — M79606 Pain in leg, unspecified: Secondary | ICD-10-CM

## 2015-08-23 DIAGNOSIS — E785 Hyperlipidemia, unspecified: Secondary | ICD-10-CM | POA: Diagnosis not present

## 2015-08-23 DIAGNOSIS — I2510 Atherosclerotic heart disease of native coronary artery without angina pectoris: Secondary | ICD-10-CM | POA: Diagnosis not present

## 2015-08-23 DIAGNOSIS — G8929 Other chronic pain: Secondary | ICD-10-CM | POA: Diagnosis not present

## 2015-08-23 DIAGNOSIS — K219 Gastro-esophageal reflux disease without esophagitis: Secondary | ICD-10-CM | POA: Insufficient documentation

## 2015-08-23 DIAGNOSIS — M5126 Other intervertebral disc displacement, lumbar region: Secondary | ICD-10-CM | POA: Insufficient documentation

## 2015-08-23 DIAGNOSIS — I10 Essential (primary) hypertension: Secondary | ICD-10-CM | POA: Diagnosis not present

## 2015-08-23 DIAGNOSIS — M5416 Radiculopathy, lumbar region: Secondary | ICD-10-CM | POA: Diagnosis not present

## 2015-08-23 DIAGNOSIS — M791 Myalgia: Secondary | ICD-10-CM | POA: Diagnosis not present

## 2015-08-23 DIAGNOSIS — M533 Sacrococcygeal disorders, not elsewhere classified: Secondary | ICD-10-CM | POA: Insufficient documentation

## 2015-08-23 DIAGNOSIS — Z981 Arthrodesis status: Secondary | ICD-10-CM | POA: Insufficient documentation

## 2015-08-23 DIAGNOSIS — J45909 Unspecified asthma, uncomplicated: Secondary | ICD-10-CM | POA: Diagnosis not present

## 2015-08-23 DIAGNOSIS — M545 Low back pain, unspecified: Secondary | ICD-10-CM

## 2015-08-23 DIAGNOSIS — M1990 Unspecified osteoarthritis, unspecified site: Secondary | ICD-10-CM | POA: Insufficient documentation

## 2015-08-23 DIAGNOSIS — Z87891 Personal history of nicotine dependence: Secondary | ICD-10-CM | POA: Insufficient documentation

## 2015-08-23 DIAGNOSIS — G43909 Migraine, unspecified, not intractable, without status migrainosus: Secondary | ICD-10-CM | POA: Diagnosis not present

## 2015-08-23 DIAGNOSIS — Z79891 Long term (current) use of opiate analgesic: Secondary | ICD-10-CM

## 2015-08-23 DIAGNOSIS — M25551 Pain in right hip: Secondary | ICD-10-CM | POA: Insufficient documentation

## 2015-08-23 DIAGNOSIS — M7061 Trochanteric bursitis, right hip: Secondary | ICD-10-CM | POA: Diagnosis not present

## 2015-08-23 DIAGNOSIS — M47816 Spondylosis without myelopathy or radiculopathy, lumbar region: Secondary | ICD-10-CM

## 2015-08-23 DIAGNOSIS — Z5181 Encounter for therapeutic drug level monitoring: Secondary | ICD-10-CM

## 2015-08-23 DIAGNOSIS — M5136 Other intervertebral disc degeneration, lumbar region: Secondary | ICD-10-CM

## 2015-08-23 DIAGNOSIS — M21372 Foot drop, left foot: Secondary | ICD-10-CM | POA: Diagnosis not present

## 2015-08-23 DIAGNOSIS — M51369 Other intervertebral disc degeneration, lumbar region without mention of lumbar back pain or lower extremity pain: Secondary | ICD-10-CM

## 2015-08-23 DIAGNOSIS — F418 Other specified anxiety disorders: Secondary | ICD-10-CM | POA: Insufficient documentation

## 2015-08-23 DIAGNOSIS — G501 Atypical facial pain: Secondary | ICD-10-CM | POA: Insufficient documentation

## 2015-08-23 DIAGNOSIS — M25552 Pain in left hip: Secondary | ICD-10-CM

## 2015-08-23 LAB — SEDIMENTATION RATE: Sed Rate: 8 mm/hr (ref 0–30)

## 2015-08-23 LAB — COMPREHENSIVE METABOLIC PANEL
ALT: 13 U/L — ABNORMAL LOW (ref 14–54)
AST: 16 U/L (ref 15–41)
Albumin: 3.9 g/dL (ref 3.5–5.0)
Alkaline Phosphatase: 48 U/L (ref 38–126)
Anion gap: 5 (ref 5–15)
BUN: 15 mg/dL (ref 6–20)
CO2: 29 mmol/L (ref 22–32)
Calcium: 9.1 mg/dL (ref 8.9–10.3)
Chloride: 106 mmol/L (ref 101–111)
Creatinine, Ser: 0.77 mg/dL (ref 0.44–1.00)
GFR calc Af Amer: >60 mL/min (ref >60)
GFR calc non Af Amer: >60 mL/min (ref >60)
Glucose, Bld: 81 mg/dL (ref 65–99)
Potassium: 3.7 mmol/L (ref 3.5–5.1)
Sodium: 140 mmol/L (ref 135–145)
Total Bilirubin: 0.6 mg/dL (ref 0.3–1.2)
Total Protein: 6.4 g/dL — ABNORMAL LOW (ref 6.5–8.1)

## 2015-08-23 LAB — C-REACTIVE PROTEIN: CRP: <0.5 mg/dL (ref <1.0)

## 2015-08-23 LAB — MAGNESIUM: Magnesium: 2.2 mg/dL (ref 1.7–2.4)

## 2015-08-23 NOTE — Patient Instructions (Addendum)
Implantable Pain Pump, Care After Pain can be treated many different ways. Sometimes, though, pain can be so severe that nothing makes it go away. Then, an implantable pain pump might be suggested. Implantable means it is put inside your body. It delivers pain medication directly into a blood vessel or the area around the spinal cord. Delivering it right to these areas is like giving the medication extra power. As a result, less medication is often needed. That also should mean fewer side effects. If you just had a pain pump implanted, this is how it will work:  During surgery, the pump was put under your skin. It is usually round and about the size of a hockey puck. It may have been placed under the skin of your belly or perhaps below your collarbone.  The pump is attached to a small plastic tube (catheter) that was inserted into a blood vessel or into the area around the spinal cord.  The pump has a space (reservoir) where the medication is stored. When it is needed, the pump pushes the medicine through the catheter and into your body. Your health care provider can set the pump to give you a steady flow of medication. Or, it can be set to deliver different amounts of pain medicine at different times.  Your health care provider can refill the reservoir. That is done by inserting a needle through your skin into the pump. AFTER SURGERY   You will stay in a recovery area until the anesthesia has worn off. Your blood pressure and pulse will be checked every so often. If the implantation was an outpatient procedure, you will go home once your body functions are back to normal. Sometimes, but rarely, an overnight stay is needed.  Before you are sent home, your health care team will make sure the pump is working well. They will also make sure you do not have a reaction to the pain medicine or to the anesthesia.  Tell your health care providers how much pain you are having. Be honest. They need this  information to make sure you get the right amount of pain medicine.  You will have a cut (incision), 4 to 6 inches long, on your skin. This will be near where the pump has been implanted. You might also have a small incision on your back. Stitches or staples will be keeping all incisions closed. Also, there will probably be a bandage (dressing) over the incisions.  Be sure to ask your health care provider for a list of things you should not do for the first few days after your surgery.  Before leaving, make an appointment for your first check-up. At this visit, your health care provider will check the pump to make sure it is still working properly. And, the pump can be refilled with pain medicine. HOME CARE INSTRUCTIONS   Only take over-the-counter or prescription medicines for pain, discomfort, or fever as directed by your health care provider.  You should be feeling less pain than before the pump was implanted. Continue to let your health care provider know about your pain. Also explain any side effects you might have. The goal is for the pump to give you enough medicine to control the pain but not so much that you have side effects.  Sometimes fluid leaks from the spinal cord area. This can cause a severe headache (called a spinal headache). The headache can also make you feel dizzy or sick to your stomach. If you have a  headache after surgery, let your health care provider know. Treatment for a spinal headache is usually to lie flat on your back. You also should drink fluids and caffeine has been shown to be helpful. Try coffee or tea.  The pain medication used in a pump is sometimes a strong pain reliever (narcotic) that can have side effects. Tell your health care provider if you have any of these reactions:  You feel sick to your stomach.  You are having trouble breathing or feel short of breath.  You feel more sleepy than usual.  Your skin itches.  You have trouble passing  urine.  Do not get the incisions wet for several days (or until your health care provider says that it is okay). You might be asked to avoid soaking baths for several weeks and to shower only.  Follow your health care provider's directions for changing the dressing on the incisions.  Wear loose clothing until your incisions are healed. This will keep the skin around them from becoming irritated.  At first, limit your movement and activities.  Do not sleep on your stomach.  Avoid significant bending and stretching.  Do not raise your arms over your head.  Do not lift anything that weighs more than 5 pounds.  Do not drive for at least two weeks (or until your health care provider says it is okay).  Do not do work around the house (inside or outside) until your health care provider gives you the go-ahead. For example, do not load the dishwasher. Do not vacuum. Do not mow the yard.  Return to your regular activities gradually. Ask your health care provider whether physical therapy would help. SEEK MEDICAL CARE IF:   An incision becomes red, swollen or painful.  An incision leaks fluid or blood.  You feel sick to your stomach, feel more sleepy than usual, or have itchy skin.  You have trouble urinating or having a bowel movement.  You have a bad headache or back pain.  Your pain is not being relieved by the pump.  You develop a fever of more than 100.47F (38.1C). SEEK IMMEDIATE MEDICAL CARE IF:  You have trouble breathing or feel short of breath.  You have a headache that lasts for more than two days.  You hear the pump beeping.  You have sudden symptoms (back pain, weakness in your legs).  You suddenly lose control over urination or bowel movements.  You develop a fever of more than 102.62F (38.9C).   This information is not intended to replace advice given to you by your health care provider. Make sure you discuss any questions you have with your health care  provider.   Document Released: 10/03/2008 Document Revised: 07/08/2014 Document Reviewed: 02/06/2015 Elsevier Interactive Patient Education 2016 [employer] An implanted pain pump is a small device, about the size of a hockey puck. It is put under your skin (implanted) during surgery. Attached to it is a catheter (small plastic tube). The tube goes into either a vein or into the space around your spinal cord. The pump has a space (reservoir) where the pain medication is stored. The device pumps it from the reservoir into your body at a regular pre-set rate. Sometimes your health care provider will set the pump to give you pain medicine when you need it. This might be in a steady flow. Or, it might be different amounts at different times. The reservoir can be easily refilled when it runs  low. An implanted pump puts the medication right where it needs to go to relieve your pain. This often means less medication will be needed. That should result in fewer side effects.  HOME CARE  An implanted pump can stay in place for a long time. But do not worry: It can be removed at any time. For example, if your condition changes or if the pump no longer helps, it can be taken out. While you have an implanted pump:  You will need to make regular visits to your health care provider. During these visits:  The pump will be refilled with pain medicine. Your health care provider will insert a needle through your skin into the pump. Medicine will flow through the needle into the reservoir. Refills are usually needed every 4 to 8 weeks. Refill time varies from person to person. Be sure to ask how often your pump will need a refill.  Your medication can be adjusted. The amount you get can be changed. How often the medicine is pumped into your body also can be changed. These changes will depend on how well the device is reducing your pain. Be honest when describing your pain to your health  care providers. They need this information to make sure you get the right amount and right type of pain medicine. It also will depend on side effects. Explain any symptoms you have, even if you do not think they have anything to do with your pain pump. Tell your caregiver if you feel sick to your stomach, if you are overly sleepy, or if your skin itches. Your health care provider will know whether the medicine might be causing these or other side effects. The goal is to give you enough medicine to ease your pain but not so much that you have side effects.  The pump will be checked to make sure it is working properly. The battery in the pump often lasts up to ten years. The pump will beep if a new battery is needed. It also will beep if it needs a refill or if there is another problem. Ask your health care provider whether you can take over-the-counter medicines for aches or fever. Which other medicines you can take could depend on the type of pain medicine in your pump.  At first, you will need to restrict your movement and activities.  For 6 to 8 weeks:  Do not sleep on your stomach.  Avoid excessive bending and stretching.  Do not raise your arms over your head.  Do not lift anything that weighs more than 5 pounds.  Do not drive for at least two weeks (or until your health care provider says it is okay).  Do not take a tub bath for about a month. Shower only.  Do not do work around the house (inside or outside) until your health care provider gives you the go-ahead. For example, do not load the dishwasher. Do not vacuum. Do not mow the yard. Over the long term, you may not have many restrictions. Just remember to return to your regular activities gradually. Ask your health care provider whether physical therapy would help. You still need to be careful in some situations:  Always keep an eye on the skin that covers the pump. Call your health care provider if it becomes red, warm or swollen.  Call if the area is painful or if the incision starts to leak blood or any liquid. These could be signs of infection.  Get a  special ID card that proves you have an implanted pump. It is called an Implanted Device Identification Card. To get this, you might need a letter or form signed by your health care provider. Ask your health care provider how to get this ID card. You will need this card when traveling. It is important to have in case of an emergency. Carry it with you at all times.  You should alert airport security checkers that you have an implanted pump. The pump will set off the alarms in the security checkpoints. Show your ID card. Ask to be checked with a security wand.  Do not worry about being around microwave ovens, cell phones, or other electronic devices. They will not harm the pump.  Make sure that family members and close friends know that you have an implanted pump. You might want to tell some co-workers, too. People need to know this in case of an emergency. SEEK MEDICAL CARE IF:   An incision becomes red, swollen, or painful.  An incision leaks fluid or blood.  You feel sick to your stomach, you feel more sleepy than usual, or you have itchy skin.  You have trouble urinating or having a bowel movement.  You have a bad headache or back pain.  Your pain is not being relieved by the pump.  You develop a fever of more than 100.5 F (38.1 C). SEEK IMMEDIATE MEDICAL CARE IF:  You have trouble breathing or feel short of breath.  You have a headache that lasts for more than two days.  You hear the pump beeping.  You have sudden symptoms (back pain, weakness in your legs).  You lose control over urination or bowel movements.  You develop a fever of more than 102.0 F (38.9 C).   This information is not intended to replace advice given to you by your health care provider. Make sure you discuss any questions you have with your health care provider.   Document  Released: 10/03/2008 Document Revised: 07/08/2014 Document Reviewed: 02/06/2015 Elsevier Interactive Patient Education 2016 [place]. Trigger Point Injections Patient Information  Description: Trigger points are areas of muscle sensitive to touch which cause pain with movement, sometimes felt some distance from the site of palpation.  Usually the muscle containing these trigger points if felt as a tight band or knot.   The area of maximum tenderness or trigger point is identified, and after antiseptic preparation of the skin, a small needle is placed into this site.  Reproduction of the pain often occurs and numbing medicine (local anesthetic) is injected into the site, sometimes along with steroid preparation.  The entire block usually lasts less than 5 minutes.  Conditions which may be treated by trigger points:   Muscular pain and spasm  Nerve irritation  Preparation for the injection:   Do not eat any solid food or dairy products within 6 hours of your appointment.  You may drink clear liquids up to 2 hours before appointment.  Clear liquids include water, black coffee, juice or soda.  No milk or cream please.  You may take your regular medications, including pain medications, with a sip of water before your appointment.  Diabetics should hold regular insulin ( if take separately) and take 1/2 normal NPH dose the morning of the procedure.  Carry some sugar containing items with you to your appointment.  A driver must accompany you and be prepared to drive you home after your procedure.   Bring all your current medications  with you.  An IV may be inserted and sedation may be given at the discretion of the physician.   A blood pressure cuff, EKG, and other monitors will often be applied during the procedure.  Some patients may need to have extra oxygen administered for a short period.  You will be asked to provide medical information, including your allergies and medications,  prior to the procedure.  We must know immediately if you are taking blood thinners (like Coumadin/Warfarin) or if you are allergic to IV iodine contrast (dye).  We must know if you could possibly be pregnant.  Possible side-effects:   Bleeding from needle site  Infection (rare, may require surgery)  Nerve injury (rare)  Numbness & tingling (temporary)  Punctured lung (if injection around chest)  Light-headedness (temporary)  Pain at injection site (several days)  Decreased blood pressure (rare, temporary)  Weakness in arm/leg (temporary)  Call if you experience:   Hive or difficulty breathing (go to the emergency room)  Inflammation or drainage at the injection site(s)  Please note:  Although the local anesthetic injected can often make your painful muscle feel good for several hours after the injection, the pain may return.  It takes 3-7 days for steroids to work.  You may not notice any pain relief for at least one week.  If effective, we will often do a series of injections spaced 3-6 weeks apart to maximally decrease your pain.  If you have any questions please call [phone] [facility] Patient Information  Description: The facets are joints in the spine between the vertebrae.  Like any joints in the body, facets can become irritated and painful.  Arthritis can also effect the facets.  By injecting steroids and local anesthetic in and around these joints, we can temporarily block the nerve supply to them.  Steroids act directly on irritated nerves and tissues to reduce selling and inflammation which often leads to decreased pain.  Facet blocks may be done anywhere along the spine from the neck to the low back depending upon the location of your pain.   After numbing the skin with local anesthetic (like Novocaine), a small needle is passed onto the facet joints under x-ray guidance.  You may experience a sensation of  pressure while this is being done.  The entire block usually lasts about 15-25 minutes.   Conditions which may be treated by facet blocks:   Low back/buttock pain  Neck/shoulder pain  Certain types of headaches  Preparation for the injection:   Do not eat any solid food or dairy products within 6 hours of your appointment.  You may drink clear liquid up to 2 hours before appointment.  Clear liquids include water, black coffee, juice or soda.  No milk or cream please.  You may take your regular medication, including pain medications, with a sip of water before your appointment.  Diabetics should hold regular insulin (if taken separately) and take 1/2 normal NPH dose the morning of the procedure.  Carry some sugar containing items with you to your appointment.  A driver must accompany you and be prepared to drive you home after your procedure.  Bring all your current medications with you.  An IV may be inserted and sedation may be given at the discretion of the physician.  A blood pressure cuff, EKG and other monitors will often be applied during the procedure.  Some patients may need to have extra oxygen administered for a  short period.  You will be asked to provide medical information, including your allergies and medications, prior to the procedure.  We must know immediately if you are taking blood thinners (like Coumadin/Warfarin) or if you are allergic to IV iodine contrast (dye).  We must know if you could possible be pregnant.  Possible side-effects:   Bleeding from needle site  Infection (rare, may require surgery)  Nerve injury (rare)  Numbness & tingling (temporary)  Difficulty urinating (rare, temporary)  Spinal headache (a headache worse with upright posture)  Light-headedness (temporary)  Pain at injection site (serveral days)  Decreased blood pressure (rare, temporary)  Weakness in arm/leg (temporary)  Pressure sensation in back/neck  (temporary)   Call if you experience:   Fever/chills associated with headache or increased back/neck pain  Headache worsened by an upright position  New onset, weakness or numbness of an extremity below the injection site  Hives or difficulty breathing (go to the emergency room)  Inflammation or drainage at the injection site(s)  Severe back/neck pain greater than usual  New symptoms which are concerning to you  Please note:  Although the local anesthetic injected can often make your back or neck feel good for several hours after the injection, the pain will likely return. It takes 3-7 days for steroids to work.  You may not notice any pain relief for at least one week.  If effective, we will often do a series of 2-3 injections spaced 3-6 weeks apart to maximally decrease your pain.  After the initial series, you may be a candidate for a more permanent nerve block of the facets.  If you have any questions, please call #[phone] [place] to get labwork drawn today at pre admit testing.

## 2015-08-23 NOTE — Progress Notes (Signed)
Post procedure evaluation

## 2015-08-23 NOTE — Progress Notes (Signed)
Patient's Name: Jacqueline Thornton MRN: [id number] DOB: 1951/04/26 DOS: 08/23/2015  Primary Reason(s) for Visit: Post-Procedure evaluation CC: Back Pain   HPI  [name] is a 65 y.o. year old, female patient, who returns today as an established patient. She has Chronic pain syndrome; Absolute anemia; Gout; HLD (hyperlipidemia); Headache, migraine; Panic attack; Chronic low back pain; Lumbar spondylosis;  Lumbar annular disc tear (L4-5); Discogenic low back pain (L3-4 and L4-5); Lumbar facet hypertrophy; Facet syndrome, lumbar; Chronic neck pain; Cervical spondylosis; Hx of cervical spine surgery; Cervical spinal fusion (C6-7 interbody fusion); Coronary atherosclerosis; Long term current use of opiate analgesic; Encounter for therapeutic drug level monitoring; Uncomplicated opioid dependence ([place]); Opiate use; Myofascial pain syndrome; CAD in native artery; Atypical face pain; Anxiety, generalized; Muscle ache; Chronic pain; Chronic sacroiliac joint pain; Chronic lumbar radicular pain (Left); Long term prescription opiate use; Encounter for chronic pain management; and Trochanteric bursitis of right hip on her problem list.. Her primarily concern today is the Back Pain   The patient returns to the clinics today after a left-sided L5-S1 lumbar epidural steroid injection under fluoroscopic guidance and IV sedation. The patient indicates that her primary pain today is in the lower extremities with the left being worst on the right. In the case of the left lower extremity the pain goes down to the bottom of her foot and her little toe in what seems to be an S1 dermatomal distribution. In the case of the right lower extremity the pain goes down to the mid calf area through the back of the leg suggesting a referred type pain. Today she is also experiencing significant pain in the lateral aspect of the right leg over the trochanteric bursa. Her second worst pain is in the lower back with the pain being primarily  in the center and traveling downwards towards the tailbone area. In the case of the lower back pain is worse on the right side.  Because this pain seems to be more in the sacral dermatomes, as well as the sacrococcygeal area, we will consider doing a caudal epidural steroid injection under fluoroscopic guidance and IV sedation. The patient's last lumbar MRI was done on 01/27/2010 and since the pain has continued to worsen, we will go ahead and schedule a repeat study. In addition, we will order a nerve conduction test of the lower extremities since she seems to be having a left foot drop.  Reported Pain Score: 7  (last pain medicine @ 0500), clinically she looks like a 4-5/10. Reported level is inconsistent with clinical obrservations. Pain Type: Chronic pain Pain Location: Back Pain Orientation: Lower Pain Descriptors / Indicators: Stabbing, Constant (left leg is affected and feels "sensitive' all the way down to ankle. ) Pain Frequency: Constant  Date of Last Visit: 07/27/15 Service Provided on Last Visit: Procedure  Controlled Substance Pharmacotherapy Assessment  Analgesic: Oxycodone/APAP 7.5/325 one tablet by mouth 5 times a day. (37.5 mg/day) MME/day: 56.25 mg/day Pharmacokinetics: Onset of action (Liberation/Absorption): Within expected pharmacological parameters Time to Peak effect (Distribution): Timing and results are as within normal expected parameters Duration of action (Metabolism/Excretion): Within normal limits for medication Pharmacodynamics: Analgesic Effect: More than 50% Activity Facilitation: Medication(s) allow patient to sit, stand, walk, and do the basic ADLs Perceived Effectiveness: Described as relatively effective, allowing for increase in activities of daily living (ADL) Side-effects or Adverse reactions: None reported Monitoring:  PMP: Compliant with practice rules and regulations UDS Results/interpretation: The patient's last UDS was done on 07/06/2015 and  it  came back with unexpected results. The test indicated that the patient not only had oxycodone on the system, but there was also some hydrocodone present. Patient was confronted about this today and reminded of the gravity of the situation. She indicated that she might have taken some of the medication that she had left at home. The patient was instructed to bring back all medicines that she has left at home so that it can be disposed of in front of witnesses. Medication Assessment Form: Reviewed. Patient indicates being compliant with therapy Treatment compliance: Compliant Risk Assessment: Substance Use Disorder (SUD) Risk Level: Low Opioid Risk Tool (ORT) Score: Total Score: 1 Depression Scale Score:    Pharmacologic Plan: Continue therapy as is  Lab Work: Illicit Drugs No results found for: THCU, COCAINSCRNUR, PCPSCRNUR, MDMA, AMPHETMU, METHADONE, ETOH  Inflammation Markers No results found for: ESRSEDRATE, CRP  Renal Function Lab Results  Component Value Date   BUN 5* 01/26/2012   CREATININE 0.86 01/26/2012   GFRAA >60 01/26/2012   GFRNONAA >60 01/26/2012    Hepatic Function Lab Results  Component Value Date   AST 30 01/26/2012   ALT 32 01/26/2012   ALBUMIN 3.4 01/26/2012    Electrolytes Lab Results  Component Value Date   NA 145 01/26/2012   K 3.5 01/26/2012   CL 111* 01/26/2012   CALCIUM 8.6 01/26/2012   MG 2.3 03/02/2014   Post-Procedure Assessment  Procedure done on last visit: The patient returns to the clinics today after a left-sided L5-S1 lumbar epidural steroid injection under fluoroscopic guidance and IV sedation. Side-effects or Adverse reactions: None reported Sedation: Procedure was performed with sedation  Results: Ultra-Short Term Relief (First 1 hour after procedure): 70 %  Possibly the results is influenced by the pharmacodynamic effect of the local anesthetic, as well as that of the intravenous analgesics and/or sedatives, when used Short  Term Relief (Initial 4-6 hrs after procedure): 70 % Short-term relief confirms injected site to be the source of pain Long Term Relief : 0 % (patient states maybe 3% relief longterm.  pain relief only last approximately 1 week.) Long-term benefit would suggest an inflammatory etiology to the pain   Current Relief (Now):  0%  No persistent benefit would suggest no long-term anti-inflammatory benefit from intervention and/or persistent or recurrent aggravating factors Interpretation of Results: This was suggested that 70% of the pain that she is experiencing is in the region of the left L5-S1 intervertebral bodies. However, there is another 30% that is unaccounted for. In addition to this, the fact that the patient did not gain any long-term benefit would suggest that there may be either significant swelling that was not addressed by a single lumbar epidural steroid injection or there is no swelling at all and we may be dealing with a mechanical compression. To distinguish between these two I will be ordering an MRI of the lumbar spine.  Allergies  Ms. Shibley has No Known Allergies.  Meds  The patient has a current medication list which includes the following prescription(s): cholecalciferol, duloxetine, enalapril, esomeprazole, estradiol, fenofibrate, gabapentin, lorazepam, methocarbamol, oxycodone-acetaminophen, oxycodone-acetaminophen, oxycodone-acetaminophen, and pravastatin.  Current Outpatient Prescriptions on File Prior to Visit  Medication Sig  . cholecalciferol (VITAMIN D) 400 UNITS TABS tablet Take 1,000 Units by mouth daily.  . DULoxetine (CYMBALTA) 60 MG capsule Take 30 mg by mouth daily.   . enalapril (VASOTEC) 5 MG tablet Take 5 mg by mouth daily.  Marland Kitchen esomeprazole (NEXIUM) 20 MG capsule Take 20 mg by  mouth daily at 12 noon.  Marland Kitchen estradiol (ESTRACE) 0.5 MG tablet Take 0.5 mg by mouth daily.   . fenofibrate 54 MG tablet Take 54 mg by mouth daily. Reported on 07/06/2015  . gabapentin  (NEURONTIN) 300 MG capsule Take 300 mg by mouth 2 (two) times daily.  Marland Kitchen LORazepam (ATIVAN) 0.5 MG tablet Take 0.5 mg by mouth 2 (two) times daily.   . methocarbamol (ROBAXIN) 750 MG tablet Take 1 tablet (750 mg total) by mouth 4 (four) times daily.  Marland Kitchen oxyCODONE-acetaminophen (PERCOCET) 7.5-325 MG tablet Take 1 tablet by mouth 5 (five) times daily.  Marland Kitchen oxyCODONE-acetaminophen (PERCOCET) 7.5-325 MG tablet Take 1 tablet by mouth 5 (five) times daily as needed for severe pain.  Marland Kitchen oxyCODONE-acetaminophen (PERCOCET) 7.5-325 MG tablet Take 1 tablet by mouth every 5 (five) minutes as needed for moderate pain or severe pain.  . pravastatin (PRAVACHOL) 20 MG tablet Take 20 mg by mouth every other day.   No current facility-administered medications on file prior to visit.    ROS  Constitutional: Afebrile, no chills, well hydrated and well nourished Gastrointestinal: negative Musculoskeletal:negative Neurological: negative Behavioral/Psych: negative  PFSH  Medical:  Ms. 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 Family: family history includes Diabetes in her father and mother; Hypertension in her sister; Stroke in her mother. Surgical:  has past surgical history that includes Cholecystectomy; Abdominal hysterectomy; Neck surgery (Bilateral); Appendectomy; Neck surgery; Shoulder surgery; and Colonoscopy with propofol (N/A, 07/19/2015). Tobacco:  reports that she has quit smoking. She does not have any smokeless tobacco history on  file. Alcohol:  reports that she does not drink alcohol. Drug:  reports that she does not use illicit drugs.  Physical Exam  Vitals:  Today's Vitals   08/23/15 0912  BP: 110/64  Pulse: 77  Temp: 97.8 F (36.6 C)  TempSrc: Oral  Resp: 16  Height: 5\' 1"  (1.549 m)  Weight: 144 lb (65.318 kg)  SpO2: 100%  PainSc: 7     Calculated BMI: Body mass index is 27.22 kg/(m^2).  General appearance: alert, cooperative, appears stated age and mild distress Eyes: PERLA Respiratory: No evidence respiratory distress, no audible rales or ronchi and no use of accessory muscles of respiration  Lumbar Spine Exam  Inspection: No gross anomalies detected Alignment: Symetrical Palpation: Tender ROM:  Flexion: Decreased Extension: Decreased Lateral Bending: Restricted ROM Rotation: Restricted ROM Provocative Tests:  Lumbar Hyperextension and rotation test:  Positive for facet pain bilaterally Patrick's Maneuver: deferred  Gait Evaluation  Gait: Antalgic (limping)  Lower Extremity Exam  Inspection: No gross anomalies detected ROM: Adequate Sensory:  Normal Motor: Guarding  Toe walk (S1): WNL  Heal walk (L5): Mild left-sided foot drop.   Assessment & Plan  Primary Diagnosis & Pertinent Problem List: The primary encounter diagnosis was Chronic pain. Diagnoses of Encounter for therapeutic drug level monitoring, Long term current use of opiate analgesic, Facet syndrome, lumbar, Trochanteric bursitis of right hip, Chronic lumbar radicular pain (Left), Chronic low back pain, and  Lumbar annular disc tear (L4-5) were also pertinent to this visit.  Visit Diagnosis: 1. Chronic pain   2. Encounter for therapeutic drug level monitoring   3. Long term  current use of opiate analgesic   4. Facet syndrome, lumbar   5. Trochanteric bursitis of right hip   6. Chronic lumbar radicular pain (Left)   7. Chronic low back pain   8.  Lumbar annular disc tear (L4-5)     Problem-specific Plan(s): No  problem-specific assessment & plan notes found for this encounter.   Plan of Care  Pharmacotherapy (Medications Ordered): No orders of the defined types were placed in this encounter.    Lab-work & Procedure Ordered: Orders Placed This Encounter  Procedures  . LUMBAR FACET(MEDIAL BRANCH NERVE BLOCK) MBNB    Standing Status: Future     Number of Occurrences:      Standing Expiration Date: 08/22/2016    Scheduling Instructions:     Side: Bilateral     Level: L2, L3, L4, L5, & S1 Medial Branch Nerve     Sedation: With Sedation.     Timeframe: ASAA    Order Specific Question:  Where will this procedure be performed?    Answer:  [facility]  . HIP INJECTION    Standing Status: Future     Number of Occurrences:      Standing Expiration Date: 08/22/2016    Scheduling Instructions:     Side: Right-sided (trochanteric bursa injection)     Sedation: With Sedation.     Timeframe: ASAP  . MR Lumbar Spine Wo Contrast    Standing Status: Future     Number of Occurrences:      Standing Expiration Date: 08/22/2016    Scheduling Instructions:     Please provide canal diameter in millimeters when describing any spinal stenosis.    Order Specific Question:  Reason for Exam (SYMPTOM  OR DIAGNOSIS REQUIRED)    Answer:  Lumbar radiculopathy/radiculitis    Order Specific Question:  Preferred imaging location?    Answer:  [facility]    Order Specific Question:  Does the patient have a pacemaker or implanted devices?    Answer:  No    Order Specific Question:  What is the patient's sedation requirement?    Answer:  No Sedation    Order Specific Question:  Call Results- Best Contact Number?    Answer:  [phone] (Pain Clinic facility) (Dr. Dossie Arbour)  . ToxASSURE Select 13 (MW), Urine    Volume: 30 ml(s). Minimum 3 ml of urine is needed. Document temperature of fresh sample. Indications: Long term (current) use of opiate analgesic (Z79.891)  . Comprehensive metabolic panel     Order Specific Question:  Has the patient fasted?    Answer:  No  . C-reactive protein  . Magnesium  . Sedimentation rate  . Vitamin B12    Indication: Bone Pain (M89.9)  . Vitamin D pnl(25-hydrxy+1,25-dihy)-bld  . NCV with EMG(electromyography)    Bilateral testing requested.    Standing Status: Future     Number of Occurrences:      Standing Expiration Date: 08/22/2016    Scheduling Instructions:     Please evaluate this patient's left foot drop. Please refer this patient to [facility] for Nerve Conduction testing of the lower extremities. (EMG & PNCV)    Order Specific Question:  Where should this test be performed?    Answer:  Other    Imaging Ordered: MR LUMBAR SPINE WO CONTRAST  Interventional Therapies: Scheduled: Bilateral lumbar facet block under fluoroscopic guidance and IV sedation. PRN Procedures: Depending on the results of the MRI and the nerve conduction test we may  proceed with an intrathecal pump implant. She already had the trial which was found to be good.    Referral(s) or Consult(s): Referral to [facility] for EMG/PNCV of both lower extremities to evaluate her left drop foot (possible L5 radiculopathy). In order to evaluate this, we will also be ordering an MRI of the lumbar spine without contrast.  Medications administered during this visit: Ms. Badolato had no medications administered during this visit.  Future Appointments Date Time Provider [place]  09/27/2015 2:00 PM Milinda Pointer, MD [facility] None    Primary Care Physician: Lavera Guise, MD Location: [facility] Note by: Kathlen Brunswick. Dossie Arbour, M.D, DABA, DABAPM, DABPM, DABIPP, FIPP

## 2015-08-29 NOTE — Progress Notes (Signed)
Quick Note:  Lab results reviewed and found to be within normal limits. ______ 

## 2015-08-29 NOTE — Progress Notes (Signed)
Quick Note:   A low total protein level can suggest a liver disorder, a kidney disorder, or a disorder in which protein is not digested or absorbed properly. Low levels may be seen in severe malnutrition and with conditions that cause malabsorption, such as celiac disease or inflammatory bowel disease (IBD).  Drugs that may decrease protein levels include estrogens and oral contraceptives.  Total protein may decrease in conditions: 1. Where production of albumin or globulin proteins is impaired, such as malnutrition or severe liver disease 2. That accelerate the breakdown or loss of protein, such as kidney disease (nephrotic syndrome) 3. That increase/expand plasma volume (diluting the blood), such as congestive heart failure  While most low ALT level results indicate a normal healthy liver, that may not always be the case. A low-functioning or non-functioning liver, lacking normal levels of ALT activity to begin with, would not release a lot of ALT into the blood when damaged. People infected with the hepatitis C virus initially show high ALT levels in their blood, but these levels fall over time. Because the ALT test measures ALT levels at only one point in time, people with chronic hepatitis C infection may already have experienced the ALT peak well before blood was drawn for the ALT test. Urinary tract infections or malnutrition may also cause low blood ALT levels. ______

## 2015-08-31 LAB — TOXASSURE SELECT 13 (MW), URINE: PDF: 0

## 2015-09-04 ENCOUNTER — Telehealth: Payer: Self-pay | Admitting: Pain Medicine

## 2015-09-04 NOTE — Telephone Encounter (Signed)
Needs to verify Med instructions

## 2015-09-06 ENCOUNTER — Other Ambulatory Visit: Payer: Self-pay | Admitting: Pain Medicine

## 2015-09-06 DIAGNOSIS — G8929 Other chronic pain: Secondary | ICD-10-CM

## 2015-09-06 MED ORDER — OXYCODONE-ACETAMINOPHEN 7.5-325 MG PO TABS: 1.0000 | ORAL_TABLET | Freq: Every day | ORAL | Status: DC | PRN

## 2015-09-14 NOTE — Progress Notes (Signed)
Quick Note:  NOTE: This forensic urine drug screen (UDS) test was conducted using a state-of-the-art ultra high performance liquid chromatography and mass spectrometry system (UPLC/MS-MS), the most sophisticated and accurate method available. UPLC/MS-MS is 1,000 times more precise and accurate than standard gas chromatography and mass spectrometry (GC/MS). This system can analyze 26 drug categories and 180 drug compounds.  The results of this test came back with unexpected findings. Unreported Benzodiazepine and ethyl alcohol ______

## 2015-09-18 ENCOUNTER — Ambulatory Visit
Admission: RE | Admit: 2015-09-18 | Discharge: 2015-09-18 | Disposition: A | Discharge status: Home / Self Care | Payer: Medicare PPO | Source: Ambulatory Visit | Attending: Pain Medicine | Admitting: Pain Medicine

## 2015-09-18 DIAGNOSIS — M5136 Other intervertebral disc degeneration, lumbar region: Secondary | ICD-10-CM | POA: Insufficient documentation

## 2015-09-18 DIAGNOSIS — M5416 Radiculopathy, lumbar region: Secondary | ICD-10-CM | POA: Diagnosis not present

## 2015-09-18 DIAGNOSIS — G8929 Other chronic pain: Secondary | ICD-10-CM | POA: Insufficient documentation

## 2015-09-18 DIAGNOSIS — M47816 Spondylosis without myelopathy or radiculopathy, lumbar region: Secondary | ICD-10-CM | POA: Insufficient documentation

## 2015-09-21 ENCOUNTER — Ambulatory Visit: Payer: Medicare PPO | Attending: Pain Medicine | Admitting: Pain Medicine

## 2015-09-21 ENCOUNTER — Encounter: Payer: Self-pay | Admitting: Pain Medicine

## 2015-09-21 VITALS — BP 128/84 | HR 71 | Temp 97.8°F | Resp 16 | Ht 61.0 in | Wt 147.0 lb

## 2015-09-21 DIAGNOSIS — M5126 Other intervertebral disc displacement, lumbar region: Secondary | ICD-10-CM | POA: Insufficient documentation

## 2015-09-21 DIAGNOSIS — D649 Anemia, unspecified: Secondary | ICD-10-CM | POA: Insufficient documentation

## 2015-09-21 DIAGNOSIS — F411 Generalized anxiety disorder: Secondary | ICD-10-CM | POA: Insufficient documentation

## 2015-09-21 DIAGNOSIS — M47816 Spondylosis without myelopathy or radiculopathy, lumbar region: Secondary | ICD-10-CM

## 2015-09-21 DIAGNOSIS — M542 Cervicalgia: Secondary | ICD-10-CM | POA: Insufficient documentation

## 2015-09-21 DIAGNOSIS — M47812 Spondylosis without myelopathy or radiculopathy, cervical region: Secondary | ICD-10-CM | POA: Diagnosis not present

## 2015-09-21 DIAGNOSIS — F410 Panic disorder [episodic paroxysmal anxiety] without agoraphobia: Secondary | ICD-10-CM | POA: Diagnosis not present

## 2015-09-21 DIAGNOSIS — M549 Dorsalgia, unspecified: Secondary | ICD-10-CM | POA: Diagnosis present

## 2015-09-21 DIAGNOSIS — M545 Low back pain: Secondary | ICD-10-CM | POA: Diagnosis not present

## 2015-09-21 DIAGNOSIS — G43909 Migraine, unspecified, not intractable, without status migrainosus: Secondary | ICD-10-CM | POA: Insufficient documentation

## 2015-09-21 DIAGNOSIS — M109 Gout, unspecified: Secondary | ICD-10-CM | POA: Insufficient documentation

## 2015-09-21 DIAGNOSIS — I2510 Atherosclerotic heart disease of native coronary artery without angina pectoris: Secondary | ICD-10-CM | POA: Insufficient documentation

## 2015-09-21 DIAGNOSIS — G8929 Other chronic pain: Secondary | ICD-10-CM | POA: Diagnosis not present

## 2015-09-21 DIAGNOSIS — M791 Myalgia: Secondary | ICD-10-CM | POA: Insufficient documentation

## 2015-09-21 DIAGNOSIS — M7061 Trochanteric bursitis, right hip: Secondary | ICD-10-CM

## 2015-09-21 DIAGNOSIS — R51 Headache: Secondary | ICD-10-CM | POA: Diagnosis not present

## 2015-09-21 DIAGNOSIS — G501 Atypical facial pain: Secondary | ICD-10-CM | POA: Diagnosis not present

## 2015-09-21 DIAGNOSIS — Z981 Arthrodesis status: Secondary | ICD-10-CM | POA: Insufficient documentation

## 2015-09-21 DIAGNOSIS — E785 Hyperlipidemia, unspecified: Secondary | ICD-10-CM | POA: Insufficient documentation

## 2015-09-21 DIAGNOSIS — M79606 Pain in leg, unspecified: Secondary | ICD-10-CM | POA: Diagnosis present

## 2015-09-21 MED ORDER — ROPIVACAINE HCL 2 MG/ML IJ SOLN
INTRAMUSCULAR | Status: AC
  Administered 2015-09-21: 11:00:00
  Filled 2015-09-21: qty 10

## 2015-09-21 MED ORDER — LIDOCAINE HCL (PF) 1 % IJ SOLN: 10.0000 mL | Freq: Once | INTRAMUSCULAR | Status: DC

## 2015-09-21 MED ORDER — ROPIVACAINE HCL 2 MG/ML IJ SOLN: 4.0000 mL | Freq: Once | INTRAMUSCULAR | Status: DC

## 2015-09-21 MED ORDER — METHYLPREDNISOLONE ACETATE 80 MG/ML IJ SUSP
INTRAMUSCULAR | Status: AC
  Administered 2015-09-21: 11:00:00
  Filled 2015-09-21: qty 1

## 2015-09-21 MED ORDER — LIDOCAINE HCL (PF) 1 % IJ SOLN
INTRAMUSCULAR | Status: AC
  Filled 2015-09-21: qty 5

## 2015-09-21 MED ORDER — FENTANYL CITRATE (PF) 100 MCG/2ML IJ SOLN
INTRAMUSCULAR | Status: AC
  Administered 2015-09-21: 100 ug via INTRAVENOUS
  Filled 2015-09-21: qty 2

## 2015-09-21 MED ORDER — METHYLPREDNISOLONE ACETATE 40 MG/ML IJ SUSP: 40.0000 mg | Freq: Once | INTRAMUSCULAR | Status: DC

## 2015-09-21 MED ORDER — ROPIVACAINE HCL 2 MG/ML IJ SOLN: 9.0000 mL | Freq: Once | INTRAMUSCULAR | Status: DC

## 2015-09-21 MED ORDER — LIDOCAINE HCL (PF) 1 % IJ SOLN
INTRAMUSCULAR | Status: AC
  Administered 2015-09-21: 11:00:00
  Filled 2015-09-21: qty 5

## 2015-09-21 MED ORDER — TRIAMCINOLONE ACETONIDE 40 MG/ML IJ SUSP
INTRAMUSCULAR | Status: AC
  Administered 2015-09-21: 11:00:00
  Filled 2015-09-21: qty 2

## 2015-09-21 MED ORDER — MIDAZOLAM HCL 5 MG/5ML IJ SOLN
INTRAMUSCULAR | Status: AC
  Administered 2015-09-21: 4 mg via INTRAVENOUS
  Filled 2015-09-21: qty 5

## 2015-09-21 MED ORDER — MIDAZOLAM HCL 5 MG/5ML IJ SOLN: 5.0000 mg | INTRAMUSCULAR | Status: DC

## 2015-09-21 MED ORDER — FENTANYL CITRATE (PF) 100 MCG/2ML IJ SOLN: 100.0000 ug | INTRAMUSCULAR | Status: DC

## 2015-09-21 MED ORDER — ROPIVACAINE HCL 2 MG/ML IJ SOLN
INTRAMUSCULAR | Status: AC
  Administered 2015-09-21: 11:00:00
  Filled 2015-09-21: qty 20

## 2015-09-21 MED ORDER — LACTATED RINGERS IV SOLN
1000.0000 mL | INTRAVENOUS | Status: AC
Start: 2015-09-21 — End: 2015-09-21

## 2015-09-21 MED ORDER — TRIAMCINOLONE ACETONIDE 40 MG/ML IJ SUSP: 40.0000 mg | Freq: Once | INTRAMUSCULAR | Status: DC

## 2015-09-21 NOTE — Progress Notes (Signed)
Patient's Name: Jacqueline Thornton MRN: [id number] DOB: 08/18/1950 DOS: 09/21/2015  Primary Reason(s) for Visit: Interventional Pain Management Treatment. CC: Back Pain and Leg Pain   Pre-Procedure Assessment:  Jacqueline Thornton is a 65 y.o. year old, female patient, seen today for interventional treatment. She has Chronic pain syndrome; Absolute anemia; Gout; HLD (hyperlipidemia); Headache, migraine; Panic attack; Chronic low back pain (Location of Secondary source of pain) (Bilateral) (midline to tailbone) (R>L); Lumbar spondylosis;  Lumbar annular disc tear (L4-5); Discogenic low back pain (L3-4 and L4-5); Lumbar facet hypertrophy; Lumbar facet syndrome (Bilateral) (R>L); Chronic neck pain; Cervical spondylosis; Hx of cervical spine surgery; Cervical spinal fusion (C6-7 interbody fusion); Coronary atherosclerosis; Long term current use of opiate analgesic; Encounter for therapeutic drug level monitoring; Uncomplicated opioid dependence ([place]); Opiate use (56.25 MME/Day); Myofascial pain syndrome; CAD in native artery; Atypical face pain; Anxiety, generalized; Muscle ache; Chronic pain; Chronic sacroiliac joint pain; Chronic lumbar radicular pain (Location of Primary Source of Pain) (Left) (S1 Dermatome); Long term prescription opiate use; Encounter for chronic pain management; Trochanteric bursitis of right hip; and Chronic lower extremity pain (Location of Primary Source of Pain) (Bilateral) (L>R) on her problem list.. Her primarily concern today is the Back Pain and Leg Pain   Today's Initial Pain Score: 8/10 Reported level of pain is incompatible with clinical obrservations. This may be secondary to a possible lack of understanding on how the pain scale works. Pain Descriptors / Indicators: Constant, Stabbing Pain Frequency: Constant  Post-procedure Pain Score: 0-No pain  Date of Last Visit: 08/23/15 Service Provided on Last Visit: Evaluation, Med Refill  Verification of the correct person, correct  site (including marking of site), and correct procedure were performed and confirmed by the patient.  Today's Vitals   09/21/15 1105 09/21/15 1118 09/21/15 1125 09/21/15 1135  BP: 119/53 132/66 129/67 128/84  Pulse: 70 73 73 71  Temp:      TempSrc:      Resp: _0 Height:      Weight:      SpO2: 100% 97% 100% 99%  PainSc: 4  0-No pain    Calculated BMI: Body mass index is 27.79 kg/(m^2). Allergies: She has No Known Allergies.. Primary Diagnosis: Lumbar spondylosis, unspecified spinal osteoarthritis [M47.816]  Procedure #1:  Type: Diagnostic Medial Branch Facet Block Region: Lumbar Level: L2, L3, L4, L5, & S1 Medial Branch Level(s) Laterality: Bilateral  Procedure #2:  Type: Therapeutic Gluteofemoral Bursa Injection Region: Greater Femoral Trochanter Region Level: Hip Joint Laterality: Right  Indications: 1. Lumbar spondylosis, unspecified spinal osteoarthritis   2. Facet syndrome, lumbar   3. Trochanteric bursitis of right hip     In addition, Jacqueline Thornton has Chronic pain syndrome; Chronic low back pain (Location of Secondary source of pain) (Bilateral) (midline to tailbone) (R>L); Lumbar spondylosis;  Lumbar annular disc tear (L4-5); Discogenic low back pain (L3-4 and L4-5); Lumbar facet hypertrophy; Lumbar facet syndrome (Bilateral) (R>L); Chronic neck pain; Cervical spondylosis; Hx of cervical spine surgery; Cervical spinal fusion (C6-7 interbody fusion); Myofascial pain syndrome; Atypical face pain; Muscle ache; Chronic pain; Chronic sacroiliac joint pain; Chronic lumbar radicular pain (Location of Primary Source of Pain) (Left) (S1 Dermatome); Trochanteric bursitis of right hip; and Chronic lower extremity pain (Location of Primary Source of Pain) (Bilateral) (L>R) on her pertinent problem list.  Consent: Secured. Under the influence of no sedatives a written informed consent was obtained, after having provided information on the risks and possible complications. To  fulfill our ethical  and legal obligations, as recommended by the American Medical Association's Code of Ethics, we have provided information to the patient about our clinical impression; the nature and purpose of the treatment or procedure; the risks, benefits, and possible complications of the intervention; alternatives; the risk(s) and benefit(s) of the alternative treatment(s) or procedure(s); and the risk(s) and benefit(s) of doing nothing. The patient was provided information about the risks and possible complications associated with the procedure. In the case of spinal procedures these may include, but are not limited to, failure to achieve desired goals, infection, bleeding, organ or nerve damage, allergic reactions, paralysis, and death. In addition, the patient was informed that Medicine is not an exact science; therefore, there is also the possibility of unforeseen risks and possible complications that may result in a catastrophic outcome. The patient indicated having understood very clearly. We have given the patient no guarantees and we have made no promises. Enough time was given to the patient to ask questions, all of which were answered to the patient's satisfaction.  Pre-Procedure Preparation: Safety Precautions: Allergies reviewed. Appropriate site, procedure, and patient were confirmed by following the Joint Commission's Universal Protocol (UP.01.01.01), in the form of a "Time Out". The patient was asked to confirm marked site and procedure, before commencing. The patient was asked about blood thinners, or active infections, both of which were denied. Patient was assessed for positional comfort and all pressure points were checked before starting procedure. Monitoring:  As per clinic protocol. Infection Control Precautions: Sterile technique used. Standard Universal Precautions were taken as recommended by the Department of [facility] for Disease Control and Prevention (CDC).  Standard pre-surgical skin prep was conducted. Respiratory hygiene and cough etiquette was practiced. Hand hygiene observed. Safe injection practices and needle disposal techniques followed. SDV (single dose vial) medications used. Medications properly checked for expiration dates and contaminants. Personal protective equipment (PPE) used: Sterile double glove technique. Radiation resistant gloves. Sterile surgical gloves.  Anesthesia, Analgesia, Anxiolysis: Type: Moderate (Conscious) Sedation & Local Anesthesia Local Anesthetic: Lidocaine 1% Route: Intravenous (IV) IV Access: Secured Sedation: Meaningful verbal contact was maintained at all times during the procedure  Indication(s): Analgesia & Anxiolysis  Description of Procedure Process #1:   Time-out: "Time-out" completed before starting procedure, as per protocol. Position: Prone Target Area: For Lumbar Facet blocks, the target is the groove formed by the junction of the transverse process and superior articular process. For the L5 dorsal ramus, the target is the notch between superior articular process and sacral ala. For the S1 dorsal ramus, the target is the superior and lateral edge of the posterior S1 Sacral foramen. Approach: Paramedial approach. Area Prepped: Entire Posterior Lumbosacral Region Prepping solution: ChloraPrep (2% chlorhexidine gluconate and 70% isopropyl alcohol) Safety Precautions: Aspiration looking for blood return was conducted prior to all injections. At no point did we inject any substances, as a needle was being advanced. No attempts were made at seeking any paresthesias. Safe injection practices and needle disposal techniques used. Medications properly checked for expiration dates. SDV (single dose vial) medications used. Latex Allergy precautions taken.   Description of the Procedure: Protocol guidelines were followed. The patient was placed in position over the fluoroscopy table. The target area was identified  and the area prepped in the usual manner. Skin desensitized using vapocoolant spray. Skin & deeper tissues infiltrated with local anesthetic. Appropriate amount of time allowed to pass for local anesthetics to take effect. The procedure needle was introduced through the skin, ipsilateral to the reported  pain, and advanced to the target area. Employing the "Medial Branch Technique", the needles were advanced to the angle made by the superior and medial portion of the transverse process, and the lateral and inferior portion of the superior articulating process of the targeted vertebral bodies. This area is known as "Burton's Eye" or the "Eye of the [place] Dog". A procedure needle was introduced through the skin, and this time advanced to the angle made by the superior and medial border of the sacral ala, and the lateral border of the S1 vertebral body. This last needle was later repositioned at the superior and lateral border of the posterior S1 foramen. Negative aspiration confirmed. Solution injected in intermittent fashion, asking for systemic symptoms every 0.5cc of injectate. The needles were then removed and the area cleansed, making sure to leave some of the prepping solution back to take advantage of its long term bactericidal properties. EBL: None Materials & Medications Used:  Needle(s) Used: 22g - 3.5" Spinal Needle(s) Medications Administered today: We administered ropivacaine (PF) 2 mg/ml (0.2%), lidocaine (PF), methylPREDNISolone acetate, fentaNYL, midazolam, ropivacaine (PF) 2 mg/ml (0.2%), and triamcinolone acetonide.Please see chart orders for dosing details.  Description of Procedure Process#2:   Time-out: "Time-out" completed before starting procedure, as per protocol. Position: Right Lateral Decubitus Target Area: Greater Trochanteric Bursa of . Approach: Posterolateral approach. Area Prepped: Entire Posterolateral Hip Area Prepping solution: ChloraPrep (2% chlorhexidine gluconate and  70% isopropyl alcohol) Safety Precautions: Aspiration looking for blood return was conducted prior to all injections. At no point did we inject any substances, as a needle was being advanced. No attempts were made at seeking any paresthesias. Safe injection practices and needle disposal techniques used. Medications properly checked for expiration dates. SDV (single dose vial) medications used. Latex Allergy precautions taken. Contrast Allergy precautions taken.   Description of the Procedure: Protocol guidelines were followed. The patient was placed in position over the procedure table. The target area was identified and the area prepped in the usual manner. Skin desensitized using vapocoolant spray. Skin & deeper tissues infiltrated with local anesthetic. Appropriate amount of time allowed to pass for local anesthetics to take effect. The procedure needles were then advanced to the target area. Proper needle placement secured. Negative aspiration confirmed. Solution injected in intermittent fashion, asking for systemic symptoms every 0.5cc of injectate. The needles were then removed and the area cleansed, making sure to leave some of the prepping solution back to take advantage of its long term bactericidal properties. EBL: None Materials & Medications Used:  Needle(s) Used: 22g - 3.5" Spinal Needle(s) Solution Injected: 0.2% PF-Ropivacaine (25m) + SDV-DepoMedrol 80 mg/ml (155m  Imaging Guidance:  Type of Imaging Technique: Fluoroscopy Guidance (Spinal) Indication(s): Assistance in needle guidance and placement for procedures requiring needle placement in or near specific anatomical locations not easily accessible without such assistance. Exposure Time: Please see nurses notes. Contrast: None required. Fluoroscopic Guidance: I was personally present in the fluoroscopy suite, where the patient was placed in position for the procedure, over the fluoroscopy-compatible table. Fluoroscopy was manipulated,  using "Tunnel Vision Technique", to obtain the best possible view of the target area, on the affected side. Parallax error was corrected before commencing the procedure. A "direction-depth-direction" technique was used to introduce the needle under continuous pulsed fluoroscopic guidance. Once the target was reached, antero-posterior, oblique, and lateral fluoroscopic projection views were taken to confirm needle placement in all planes. Permanently recorded images stored by scanning into EMR. Interpretation: Intraoperative imaging interpretation by performing Physician. Adequate  needle placement confirmed. Adequate needle placement confirmed in AP, lateral, & Oblique Views. No contrast injected.  Antibiotic Prophylaxis:  Indication(s): No indications identified. Type:  Antibiotics Given (last 72 hours)    None       Post-operative Assessment:  Complications: No immediate post-treatment complications were observed. Disposition: Return to clinic for follow-up evaluation. The patient tolerated the entire procedure well. A repeat set of vitals were taken after the procedure and the patient was kept under observation following institutional policy, for this procedure. Post-procedural neurological assessment was performed, showing return to baseline, prior to discharge. The patient was discharged home, once institutional criteria were met. The patient was provided with post-procedure discharge instructions, including a section on how to identify potential problems. Should any problems arise concerning this procedure, the patient was given instructions to immediately contact us, at any time, without hesitation. In any case, we plan to contact the patient by telephone for a follow-up status report regarding this interventional procedure. Comments:  No additional relevant information.  Medications administered during this visit: We administered ropivacaine (PF) 2 mg/ml (0.2%), lidocaine (PF),  methylPREDNISolone acetate, fentaNYL, midazolam, ropivacaine (PF) 2 mg/ml (0.2%), and triamcinolone acetonide.  Prescriptions ordered during this visit: New Prescriptions   No medications on file    Future Appointments Date Time Provider [place]  09/27/2015 2:00 PM Milinda Pointer, MD [facility] None    Primary Care Physician: Lavera Guise, MD Location: [facility] Note by: Kathlen Brunswick. Dossie Arbour, M.D, DABA, DABAPM, DABPM, DABIPP, FIPP   Illustration of the posterior view of the lumbar spine and the posterior neural structures. Laminae of L2 through S1 are labeled. DPRL5, dorsal primary ramus of L5; DPRS1, dorsal primary ramus of S1; DPR3, dorsal primary ramus of L3; FJ, facet (zygapophyseal) joint L3-L4; I, inferior articular process of L4; LB1, lateral branch of dorsal primary ramus of L1; IAB, inferior articular branches from L3 medial branch (supplies L4-L5 facet joint); IBP, intermediate branch plexus; MB3, medial branch of dorsal primary ramus of L3; NR3, third lumbar nerve root; S, superior articular process of L5; SAB, superior articular branches from L4 (supplies L4-5 facet joint also); TP3, transverse process of L3.  Disclaimer:  Medicine is not an Chief Strategy Officer. The only guarantee in medicine is that nothing is guaranteed. It is important to note that the decision to proceed with this intervention was based on the information collected from the patient. The Data and conclusions were drawn from the patient's questionnaire, the interview, and the physical examination. Because the information was provided in large part by the patient, it cannot be guaranteed that it has not been purposely or unconsciously manipulated. Every effort has been made to obtain as much relevant data as possible for this evaluation. It is important to note that the conclusions that lead to this procedure are derived in large part from the available data. Always take into  account that the treatment will also be dependent on availability of resources and existing treatment guidelines, considered by other Pain Management Practitioners as being common knowledge and practice, at the time of the intervention. For Medico-Legal purposes, it is also important to point out that variation in procedural techniques and pharmacological choices are the acceptable norm. The indications, contraindications, technique, and results of the above procedure should only be interpreted and judged by a Board-Certified Interventional Pain Specialist with extensive familiarity and expertise in the same exact procedure and technique. Attempts at providing opinions without similar or greater experience and expertise than  that of the treating physician will be considered as inappropriate and unethical, and shall result in a formal complaint to the state medical board and applicable specialty societies.

## 2015-09-21 NOTE — Patient Instructions (Signed)
Facet Blocks Patient Information  Description: The facets are joints in the spine between the vertebrae.  Like any joints in the body, facets can become irritated and painful.  Arthritis can also effect the facets.  By injecting steroids and local anesthetic in and around these joints, we can temporarily block the nerve supply to them.  Steroids act directly on irritated nerves and tissues to reduce selling and inflammation which often leads to decreased pain.  Facet blocks may be done anywhere along the spine from the neck to the low back depending upon the location of your pain.   After numbing the skin with local anesthetic (like Novocaine), a small needle is passed onto the facet joints under x-ray guidance.  You may experience a sensation of pressure while this is being done.  The entire block usually lasts about 15-25 minutes.   Conditions which may be treated by facet blocks:   Low back/buttock pain  Neck/shoulder pain  Certain types of headaches  Preparation for the injection:  1. Do not eat any solid food or dairy products within 8 hours of your appointment. 2. You may drink clear liquid up to 3 hours before appointment.  Clear liquids include water, black coffee, juice or soda.  No milk or cream please. 3. You may take your regular medication, including pain medications, with a sip of water before your appointment.  Diabetics should hold regular insulin (if taken separately) and take 1/2 normal NPH dose the morning of the procedure.  Carry some sugar containing items with you to your appointment. 4. A driver must accompany you and be prepared to drive you home after your procedure. 5. Bring all your current medications with you. 6. An IV may be inserted and sedation may be given at the discretion of the physician. 7. A blood pressure cuff, EKG and other monitors will often be applied during the procedure.  Some patients may need to have extra oxygen administered for a short  period. 8. You will be asked to provide medical information, including your allergies and medications, prior to the procedure.  We must know immediately if you are taking blood thinners (like Coumadin/Warfarin) or if you are allergic to IV iodine contrast (dye).  We must know if you could possible be pregnant.  Possible side-effects:   Bleeding from needle site  Infection (rare, may require surgery)  Nerve injury (rare)  Numbness & tingling (temporary)  Difficulty urinating (rare, temporary)  Spinal headache (a headache worse with upright posture)  Light-headedness (temporary)  Pain at injection site (serveral days)  Decreased blood pressure (rare, temporary)  Weakness in arm/leg (temporary)  Pressure sensation in back/neck (temporary)   Call if you experience:   Fever/chills associated with headache or increased back/neck pain  Headache worsened by an upright position  New onset, weakness or numbness of an extremity below the injection site  Hives or difficulty breathing (go to the emergency room)  Inflammation or drainage at the injection site(s)  Severe back/neck pain greater than usual  New symptoms which are concerning to you  Please note:  Although the local anesthetic injected can often make your back or neck feel good for several hours after the injection, the pain will likely return. It takes 3-7 days for steroids to work.  You may not notice any pain relief for at least one week.  If effective, we will often do a series of 2-3 injections spaced 3-6 weeks apart to maximally decrease your pain.  After the initial   series, you may be a candidate for a more permanent nerve block of the facets.  If you have any questions, please call [phone] [facility]  Pain Management Discharge Instructions  General Discharge Instructions :  If you need to reach your doctor call: Monday-Friday 8:00 am - 4:00 pm at [phone] or  toll free [phone].  After clinic hours [phone] to have operator reach doctor.  Bring all of your medication bottles to all your appointments in the pain clinic.  To cancel or reschedule your appointment with Pain Management please remember to call 24 hours in advance to avoid a fee.  Refer to the educational materials which you have been given on: General Risks, I had my Procedure. Discharge Instructions, Post Sedation.  Post Procedure Instructions:  The drugs you were given will stay in your system until tomorrow, so for the next 24 hours you should not drive, make any legal decisions or drink any alcoholic beverages.  You may eat anything you prefer, but it is better to start with liquids then soups and crackers, and gradually work up to solid foods.  Please notify your doctor immediately if you have any unusual bleeding, trouble breathing or pain that is not related to your normal pain.  Depending on the type of procedure that was done, some parts of your body may feel week and/or numb.  This usually clears up by tonight or the next day.  Walk with the use of an assistive device or accompanied by an adult for the 24 hours.  You may use ice on the affected area for the first 24 hours.  Put ice in a Ziploc bag and cover with a towel and place against area 15 minutes on 15 minutes off.  You may switch to heat after 24 hours. 

## 2015-09-21 NOTE — Progress Notes (Signed)
Safety precautions to be maintained throughout the outpatient stay will include: orient to surroundings, keep bed in low position, maintain call bell within reach at all times, provide assistance with transfer out of bed and ambulation.  

## 2015-09-22 ENCOUNTER — Telehealth: Payer: Self-pay | Admitting: *Deleted

## 2015-09-22 NOTE — Telephone Encounter (Signed)
No problems post procedure. 

## 2015-09-27 ENCOUNTER — Encounter: Payer: Self-pay | Admitting: Pain Medicine

## 2015-09-27 ENCOUNTER — Ambulatory Visit: Payer: Medicare PPO | Attending: Pain Medicine | Admitting: Pain Medicine

## 2015-09-27 ENCOUNTER — Ambulatory Visit: Payer: Medicare PPO

## 2015-09-27 VITALS — BP 119/69 | HR 80 | Temp 97.9°F | Resp 18 | Ht 61.0 in | Wt 147.0 lb

## 2015-09-27 DIAGNOSIS — G545 Neuralgic amyotrophy: Secondary | ICD-10-CM | POA: Insufficient documentation

## 2015-09-27 DIAGNOSIS — M791 Myalgia, unspecified site: Secondary | ICD-10-CM

## 2015-09-27 DIAGNOSIS — E785 Hyperlipidemia, unspecified: Secondary | ICD-10-CM | POA: Insufficient documentation

## 2015-09-27 DIAGNOSIS — M47816 Spondylosis without myelopathy or radiculopathy, lumbar region: Secondary | ICD-10-CM

## 2015-09-27 DIAGNOSIS — G43909 Migraine, unspecified, not intractable, without status migrainosus: Secondary | ICD-10-CM | POA: Diagnosis not present

## 2015-09-27 DIAGNOSIS — F410 Panic disorder [episodic paroxysmal anxiety] without agoraphobia: Secondary | ICD-10-CM | POA: Insufficient documentation

## 2015-09-27 DIAGNOSIS — F411 Generalized anxiety disorder: Secondary | ICD-10-CM | POA: Diagnosis not present

## 2015-09-27 DIAGNOSIS — M545 Low back pain, unspecified: Secondary | ICD-10-CM

## 2015-09-27 DIAGNOSIS — Z87891 Personal history of nicotine dependence: Secondary | ICD-10-CM | POA: Insufficient documentation

## 2015-09-27 DIAGNOSIS — G501 Atypical facial pain: Secondary | ICD-10-CM | POA: Diagnosis not present

## 2015-09-27 DIAGNOSIS — K219 Gastro-esophageal reflux disease without esophagitis: Secondary | ICD-10-CM | POA: Insufficient documentation

## 2015-09-27 DIAGNOSIS — Z79891 Long term (current) use of opiate analgesic: Secondary | ICD-10-CM

## 2015-09-27 DIAGNOSIS — I2510 Atherosclerotic heart disease of native coronary artery without angina pectoris: Secondary | ICD-10-CM | POA: Insufficient documentation

## 2015-09-27 DIAGNOSIS — G8929 Other chronic pain: Secondary | ICD-10-CM

## 2015-09-27 DIAGNOSIS — D649 Anemia, unspecified: Secondary | ICD-10-CM | POA: Diagnosis not present

## 2015-09-27 DIAGNOSIS — M533 Sacrococcygeal disorders, not elsewhere classified: Secondary | ICD-10-CM | POA: Insufficient documentation

## 2015-09-27 DIAGNOSIS — M7918 Myalgia, other site: Secondary | ICD-10-CM

## 2015-09-27 DIAGNOSIS — M7061 Trochanteric bursitis, right hip: Secondary | ICD-10-CM | POA: Diagnosis not present

## 2015-09-27 DIAGNOSIS — M109 Gout, unspecified: Secondary | ICD-10-CM | POA: Diagnosis not present

## 2015-09-27 DIAGNOSIS — M549 Dorsalgia, unspecified: Secondary | ICD-10-CM | POA: Diagnosis present

## 2015-09-27 DIAGNOSIS — M25559 Pain in unspecified hip: Secondary | ICD-10-CM | POA: Diagnosis present

## 2015-09-27 DIAGNOSIS — Z5181 Encounter for therapeutic drug level monitoring: Secondary | ICD-10-CM

## 2015-09-27 DIAGNOSIS — F1190 Opioid use, unspecified, uncomplicated: Secondary | ICD-10-CM | POA: Diagnosis not present

## 2015-09-27 DIAGNOSIS — M792 Neuralgia and neuritis, unspecified: Secondary | ICD-10-CM

## 2015-09-27 MED ORDER — OXYCODONE-ACETAMINOPHEN 7.5-325 MG PO TABS: 1.0000 | ORAL_TABLET | Freq: Every day | ORAL | Status: DC | PRN

## 2015-09-27 MED ORDER — GABAPENTIN 300 MG PO CAPS: 300.0000 mg | ORAL_CAPSULE | Freq: Two times a day (BID) | ORAL | Status: DC

## 2015-09-27 MED ORDER — NALOXONE HCL 4 MG/0.1ML NA LIQD: 1.0000 | Freq: Once | NASAL | Status: DC

## 2015-09-27 MED ORDER — METHOCARBAMOL 750 MG PO TABS: 750.0000 mg | ORAL_TABLET | Freq: Four times a day (QID) | ORAL | Status: DC

## 2015-09-27 MED ORDER — MELOXICAM 15 MG PO TABS: 15.0000 mg | ORAL_TABLET | Freq: Every day | ORAL | Status: DC

## 2015-09-27 NOTE — Patient Instructions (Signed)
Radiofrequency Lesioning Radiofrequency lesioning is a procedure that is performed to relieve pain. The procedure is often used for back, neck, or arm pain. Radiofrequency lesioning involves the use of a machine that creates radio waves to make heat. During the procedure, the heat is applied to the nerve that carries the pain signal. The heat damages the nerve and interferes with the pain signal. Pain relief usually lasts for 6 months to 1 year. LET [facility] CARE PROVIDER KNOW ABOUT:  Any allergies you have.  All medicines you are taking, including vitamins, herbs, eye drops, creams, and over-the-counter medicines.  Previous problems you or members of your family have had with the use of anesthetics.  Any blood disorders you have.  Previous surgeries you have had.  Any medical conditions you have.  Whether you are pregnant or may be pregnant. RISKS AND COMPLICATIONS Generally, this is a safe procedure. However, problems may occur, including:  Pain or soreness at the injection site.  Infection at the injection site.  Damage to nerves or blood vessels. BEFORE THE PROCEDURE  Ask your health care provider about:  Changing or stopping your regular medicines. This is especially important if you are taking diabetes medicines or blood thinners.  Taking medicines such as aspirin and ibuprofen. These medicines can thin your blood. Do not take these medicines before your procedure if your health care provider instructs you not to.  Follow instructions from your health care provider about eating or drinking restrictions.  Plan to have someone take you home after the procedure.  If you go home right after the procedure, plan to have someone with you for 24 hours. PROCEDURE  You will be given one or more of the following:  A medicine to help you relax (sedative).  A medicine to numb the area (local anesthetic).  You will be awake during the procedure. You will need to be able to  talk with the health care provider during the procedure.  With the help of a type of X-ray (fluoroscopy), the health care provider will insert a radiofrequency needle into the area to be treated.  Next, a wire that carries the radio waves (electrode) will be put through the radiofrequency needle. An electrical pulse will be sent through the electrode to verify the correct nerve. You will feel a tingling sensation, and you may have muscle twitching.  Then, the tissue that is around the needle tip will be heated by an electric current that is passed using the radiofrequency machine. This will numb the nerves.  A bandage (dressing) will be put on the insertion area after the procedure is done. The procedure may vary among health care providers and hospitals. AFTER THE PROCEDURE  Your blood pressure, heart rate, breathing rate, and blood oxygen level will be monitored often until the medicines you were given have worn off.  Return to your normal activities as directed by your health care provider.   This information is not intended to replace advice given to you by your health care provider. Make sure you discuss any questions you have with your health care provider.   Document Released: 02/13/2011 Document Revised: 03/08/2015 Document Reviewed: 07/25/2014 Elsevier Interactive Patient Education 2016 [place] on Preemptive Analgesia given.

## 2015-09-27 NOTE — Progress Notes (Signed)
Safety precautions to be maintained throughout the outpatient stay will include: orient to surroundings, keep bed in low position, maintain call bell within reach at all times, provide assistance with transfer out of bed and ambulation. Oxycodone pill count #18/150  Filled 09-04-15

## 2015-09-27 NOTE — Progress Notes (Signed)
Patient's Name: Jacqueline Thornton MRN: [id number] DOB: May 20, 1951 DOS: 09/27/2015  Primary Reason(s) for Visit: Post-Procedure evaluation and medication management. CC: Hip Pain and Back Pain   HPI  Jacqueline Thornton is a 65 y.o. year old, female patient, who returns today as an established patient. She has Chronic pain syndrome; Absolute anemia; Gout; HLD (hyperlipidemia); Headache, migraine; Panic attack; Chronic low back pain (Location of Secondary source of pain) (Bilateral) (midline to tailbone) (R>L); Lumbar spondylosis;  Lumbar annular disc tear (L4-5); Discogenic low back pain (L3-4 and L4-5); Lumbar facet hypertrophy; Lumbar facet syndrome (Bilateral) (R>L); Chronic neck pain; Cervical spondylosis; Hx of cervical spine surgery; Cervical spinal fusion (C6-7 interbody fusion); Coronary atherosclerosis; Long term current use of opiate analgesic; Encounter for therapeutic drug level monitoring; Uncomplicated opioid dependence ([place]); Opiate use (56.25 MME/Day); Myofascial pain syndrome; CAD in native artery; Atypical face pain; Anxiety, generalized; Muscle ache; Chronic pain; Chronic sacroiliac joint pain; Chronic lumbar radicular pain (Location of Primary Source of Pain) (Left) (S1 Dermatome); Long term prescription opiate use; Encounter for chronic pain management; Trochanteric bursitis of right hip; Chronic lower extremity pain (Location of Primary Source of Pain) (Bilateral) (L>R); Neurogenic pain; and Myofascial pain on her problem list.. Her primarily concern today is the Hip Pain and Back Pain   The patient returns to the clinics today after having had a bilateral diagnostic lumbar facet block under fluoroscopic guidance and IV sedation. In addition the patient had a trochanteric bursa injection on the right side.  Pain Assessment: Self-Reported Pain Score: 5 , clinically she looks like a 2-3/10. Reported level is compatible with observation Pain Type: Chronic pain Pain Location: Hip Pain  Orientation: Left Pain Descriptors / Indicators: Constant, Stabbing Pain Frequency: Constant  Date of Last Visit: 09/21/15 Service Provided on Last Visit: Procedure (BLF and hip injection)  Controlled Substance Pharmacotherapy Assessment  Analgesic: Oxycodone 7.5/325 one tablet 5 times a day. (56.25 mg/day) Pill Count: Oxycodone pill count #18/150 Filled 09-04-15 MME/day: 56.25 mg/day Pharmacokinetics: Onset of action (Liberation/Absorption): Within expected pharmacological parameters Time to Peak effect (Distribution): Timing and results are as within normal expected parameters Duration of action (Metabolism/Excretion): Within normal limits for medication Pharmacodynamics: Analgesic Effect: More than 50% Activity Facilitation: Medication(s) allow patient to sit, stand, walk, and do the basic ADLs Perceived Effectiveness: Described as relatively effective, allowing for increase in activities of daily living (ADL) Side-effects or Adverse reactions: None reported Monitoring: [place] PMP: Online review of the past 45-month period conducted. Compliant with practice rules and regulations UDS Results/interpretation: The patient's last UDS was done on 08/23/2015 and it came back abnormal due to the presence of undeclared ethyl alcohol and  Today we had a conversation with the patient with regards to the CDC guideline and there are recommendation not to mix benzodiazepines and opioids. Medication Assessment Form: Reviewed. Patient indicates being compliant with therapy Treatment compliance: Compliant Risk Assessment: Aberrant Behavior: None observed today Substance Use Disorder (SUD) Risk Level: Low Opioid Risk Tool (ORT) Score:  3 Low Risk for SUD (Score <3) Depression Scale Score: PHQ-2: PHQ-2 Total Score: 0 No depression (0) PHQ-9: PHQ-9 Total Score: 0 No depression (0-4)  Pharmacologic Plan: No change in therapy, at this time   Laboratory Workup  Last ED UDS: No results found for: THCU,  COCAINSCRNUR, PCPSCRNUR, MDMA, AMPHETMU, METHADONE, ETOH  Inflammation Markers Lab Results  Component Value Date   ESRSEDRATE 8 08/23/2015   CRP <0.5 08/23/2015    Renal Function Lab Results  Component Value Date  BUN 15 08/23/2015   CREATININE 0.77 08/23/2015   GFRAA >60 08/23/2015   GFRNONAA >60 08/23/2015    Hepatic Function Lab Results  Component Value Date   AST 16 08/23/2015   ALT 13* 08/23/2015   ALBUMIN 3.9 08/23/2015    Electrolytes Lab Results  Component Value Date   NA 140 08/23/2015   K 3.7 08/23/2015   CL 106 08/23/2015   CALCIUM 9.1 08/23/2015   MG 2.2 08/23/2015   Post-Procedure Assessment  Procedure done on last visit: Bilateral diagnostic lumbar facet block under fluoroscopic guidance and IV sedation. In addition the patient had a trochanteric bursa injection on the right side. Side-effects or Adverse reactions: None reported Sedation: Sedation given  Results: Ultra-Short Term Relief (First 1 hour after procedure): 100 %  Analgesia during this period is likely to be Local Anesthetic and/or IV Sedative (Analgesic/Anxiolitic) related Short Term Relief (Initial 4-6 hrs after procedure): 25 % Complete relief confirms area to be the source of pain Long Term Relief : 50 % (3 days) Long-term benefit would suggest an inflammatory etiology to the pain   Current Relief (Now): 50%  Persistent relief would suggest effective anti-inflammatory effects from steroids Interpretation of Results: This patient has had multiple diagnostic bilateral lumbar facet blocks under fluoroscopic guidance with excellent success. Unfortunately none have provided the patient with long-term benefits. Because of this, we will consider proceeding with radiofrequency ablation.  Allergies  Jacqueline Thornton has No Known Allergies.  Meds  The patient has a current medication list which includes the following prescription(s): cholecalciferol, duloxetine, enalapril, esomeprazole, estradiol,  fenofibrate, gabapentin, methocarbamol, oxycodone-acetaminophen, oxycodone-acetaminophen, oxycodone-acetaminophen, meloxicam, and naloxone hcl.  Current Outpatient Prescriptions on File Prior to Visit  Medication Sig  . cholecalciferol (VITAMIN D) 400 UNITS TABS tablet Take 1,000 Units by mouth daily.  . DULoxetine (CYMBALTA) 60 MG capsule Take 30 mg by mouth daily.   . enalapril (VASOTEC) 5 MG tablet Take 5 mg by mouth daily.  Marland Kitchen esomeprazole (NEXIUM) 20 MG capsule Take 20 mg by mouth daily at 12 noon.  Marland Kitchen estradiol (ESTRACE) 0.5 MG tablet Take 0.5 mg by mouth daily.   . fenofibrate 54 MG tablet Take 54 mg by mouth daily. Reported on 07/06/2015   No current facility-administered medications on file prior to visit.    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 Family: family history includes Diabetes in her father and mother; Hypertension in her sister; Stroke in her mother. Surgical:  has past surgical history that includes Cholecystectomy; Abdominal hysterectomy; Neck surgery (Bilateral); Appendectomy; Neck surgery; Shoulder surgery; and Colonoscopy with propofol (N/A, 07/19/2015). Tobacco:  reports that she has quit smoking. She does not have any smokeless tobacco history on file. Alcohol:  reports that she does not drink  alcohol. Drug:  reports that she does not use illicit drugs.  Physical Exam  Vitals:  Today's Vitals   09/27/15 1416 09/27/15 1418  BP: 119/69   Pulse: 80   Temp: 97.9 F (36.6 C)   Resp: 18   Height: 5\' 1"  (1.549  m)   Weight: 147 lb (66.679 kg)   SpO2: 100%   PainSc: 5  5   PainLoc: Hip     Calculated BMI: Body mass index is 27.79 kg/(m^2). Overweight (25-29.9 kg/m2) - 20% higher incidence of chronic pain  General appearance: alert, cooperative, appears stated age and mild distress Eyes: PERLA Respiratory: No evidence respiratory distress, no audible rales or ronchi and no use of accessory muscles of respiration  Lumbar Spine Exam  Inspection: No gross anomalies detected Alignment: Symetrical Palpation: Tender ROM:  Flexion: Decreased Extension: Decreased Lateral Bending: Restricted ROM Rotation: Restricted ROM Provocative Tests:  Lumbar Hyperextension and rotation test:  Positive for facet pain bilaterally Patrick's Maneuver: deferred  Gait Evaluation  Gait: Antalgic (limping)  Lower Extremity Exam  Inspection: No gross anomalies detected ROM: Adequate Sensory:  Normal Motor: Guarding  Toe walk (S1): WNL  Heal walk (L5): Mild left-sided foot drop.  Assessment & Plan  Primary Diagnosis & Pertinent Problem List: The primary encounter diagnosis was Chronic pain. Diagnoses of Encounter for therapeutic drug level monitoring, Long term current use of opiate analgesic, Opiate use (56.25 MME/Day), Chronic low back pain (Location of Secondary source of pain) (Bilateral) (midline to tailbone) (R>L), Lumbar facet syndrome (Bilateral) (R>L), Lumbar spondylosis, unspecified spinal osteoarthritis, Muscle ache, Neurogenic pain, and Myofascial pain were also pertinent to this visit.  Visit Diagnosis: 1. Chronic pain   2. Encounter for therapeutic drug level monitoring   3. Long term current use of opiate analgesic   4. Opiate use (56.25 MME/Day)   5. Chronic low back pain  (Location of Secondary source of pain) (Bilateral) (midline to tailbone) (R>L)   6. Lumbar facet syndrome (Bilateral) (R>L)   7. Lumbar spondylosis, unspecified spinal osteoarthritis   8. Muscle ache   9. Neurogenic pain   10. Myofascial pain     Problem-specific Plan(s): No problem-specific assessment & plan notes found for this encounter.   Plan of Care  Pharmacotherapy (Medications Ordered): Meds ordered this encounter  Medications  . oxyCODONE-acetaminophen (PERCOCET) 7.5-325 MG tablet    Sig: Take 1 tablet by mouth 5 (five) times daily as needed for severe pain.    Dispense:  150 tablet    Refill:  0    Do not place this medication, or any other prescription from our practice, on "Automatic Refill". Patient may have prescription filled one day early if pharmacy is closed on scheduled refill date. Do not fill until: 10/03/15 To last until: 11/02/15  . oxyCODONE-acetaminophen (PERCOCET) 7.5-325 MG tablet    Sig: Take 1 tablet by mouth 5 (five) times daily as needed for moderate pain or severe pain.    Dispense:  150 tablet    Refill:  0    Do not place this medication, or any other prescription from our practice, on "Automatic Refill". Patient may have prescription filled one day early if pharmacy is closed on scheduled refill date. Do not fill until: 11/02/15 To last until: 12/02/15  . oxyCODONE-acetaminophen (PERCOCET) 7.5-325 MG tablet    Sig: Take 1 tablet by mouth 5 (five) times daily as needed for moderate pain or severe pain.    Dispense:  150 tablet    Refill:  0    Do not place this medication, or any other prescription from our practice, on "Automatic Refill". Patient may have prescription filled one day early if pharmacy is closed on scheduled refill date. Do not fill until: 12/02/15 To last until: 01/01/16  . methocarbamol (ROBAXIN) 750 MG tablet  Sig: Take 1 tablet (750 mg total) by mouth 4 (four) times daily.    Dispense:  120 tablet    Refill:  2    Do not  place this medication, or any other prescription from our practice, on "Automatic Refill". Patient may have prescription filled one day early if pharmacy is closed on scheduled refill date.  . gabapentin (NEURONTIN) 300 MG capsule    Sig: Take 1 capsule (300 mg total) by mouth 2 (two) times daily.    Dispense:  60 capsule    Refill:  2    Do not add this medication to the electronic "Automatic Refill" notification system. Patient may have prescription filled one day early if pharmacy is closed on scheduled refill date.  . meloxicam (MOBIC) 15 MG tablet    Sig: Take 1 tablet (15 mg total) by mouth daily.    Dispense:  30 tablet    Refill:  2    Do not add this medication to the electronic "Automatic Refill" notification system. Patient may have prescription filled one day early if pharmacy is closed on scheduled refill date.  . Naloxone HCl (NARCAN) 4 MG/0.1ML LIQD    Sig: Place 1 Bottle into the nose once. , then call 911, repeat if needed in other nostril with new bottle.    Dispense:  2 each    Refill:  0    Please provide the patient with clear instructions on the use of this device/medication.    Lab-work & Procedure Ordered: Orders Placed This Encounter  Procedures  . Radiofrequency,Lumbar    Standing Status: Future     Number of Occurrences:      Standing Expiration Date: 09/26/2016    Scheduling Instructions:     Side(s): Left-sided     Level(s): L2, L3, L4, L5, & S1 Medial Branch Nerves     Sedation: With Sedation.     Timeframe: ASAA    Order Specific Question:  Where will this procedure be performed?    Answer:  [facility]  . ToxASSURE Select 13 (MW), Urine    Volume: 30 ml(s). Minimum 3 ml of urine is needed. Document temperature of fresh sample. Indications: Long term (current) use of opiate analgesic [id number]    Imaging Ordered: None  Interventional Therapies: Scheduled: Left lumbar facet radiofrequency ablation under fluoroscopic guidance and IV  sedation. PRN Procedures: We will plan on doing the opposite side 2-4 weeks later.    Referral(s) or Consult(s): None at this time.  Medications administered during this visit: Jacqueline Thornton had no medications administered during this visit.  Future Appointments Date Time Provider [place]  12/27/2015 1:40 PM Milinda Pointer, MD [facility] None    Primary Care Physician: Lavera Guise, MD Location: [facility] Note by: Kathlen Brunswick. Dossie Arbour, M.D, DABA, DABAPM, DABPM, DABIPP, FIPP  Pain Score Disclaimer: We use the NRS-11 scale. This is a self-reported, subjective measurement of pain severity with only modest accuracy. It is used primarily to identify changes within a particular patient. It must be understood that outpatient pain scales are significantly less accurate that those used for research, where they can be applied under ideal controlled circumstances with minimal exposure to variables. In reality, the score is likely to be a combination of pain intensity and pain affect, where pain affect describes the degree of emotional arousal or changes in action readiness caused by the sensory experience of pain. Factors such as social and work situation, setting, emotional state, anxiety  levels, expectation, and prior pain experience may influence pain perception and show large inter-individual differences that may also be affected by time variables.

## 2015-09-28 ENCOUNTER — Ambulatory Visit: Payer: Medicare PPO

## 2015-10-01 LAB — TOXASSURE SELECT 13 (MW), URINE: PDF: 0

## 2015-10-04 NOTE — Progress Notes (Signed)
Quick Note:  The results of this diagnostic imaging were reviewed and found to be mildly abnormal. No acute injury or pathology identified. Consider interventional pain management techniques. Also consider lumbar facet blocks as well as caudal epidural steroid injections under fluoroscopic guidance. ______

## 2015-10-09 ENCOUNTER — Other Ambulatory Visit: Payer: Self-pay | Admitting: Internal Medicine

## 2015-10-09 DIAGNOSIS — Z1231 Encounter for screening mammogram for malignant neoplasm of breast: Secondary | ICD-10-CM

## 2015-10-19 ENCOUNTER — Ambulatory Visit: Payer: Medicare PPO | Attending: Internal Medicine

## 2015-11-02 ENCOUNTER — Encounter: Payer: Self-pay | Admitting: Pain Medicine

## 2015-11-02 ENCOUNTER — Ambulatory Visit: Payer: Medicare PPO | Attending: Pain Medicine | Admitting: Pain Medicine

## 2015-11-02 VITALS — BP 117/56 | HR 70 | Temp 97.7°F | Resp 16 | Ht 61.0 in | Wt 144.0 lb

## 2015-11-02 DIAGNOSIS — M109 Gout, unspecified: Secondary | ICD-10-CM | POA: Insufficient documentation

## 2015-11-02 DIAGNOSIS — G501 Atypical facial pain: Secondary | ICD-10-CM | POA: Diagnosis not present

## 2015-11-02 DIAGNOSIS — M533 Sacrococcygeal disorders, not elsewhere classified: Secondary | ICD-10-CM | POA: Insufficient documentation

## 2015-11-02 DIAGNOSIS — M545 Low back pain, unspecified: Secondary | ICD-10-CM

## 2015-11-02 DIAGNOSIS — M7061 Trochanteric bursitis, right hip: Secondary | ICD-10-CM | POA: Diagnosis not present

## 2015-11-02 DIAGNOSIS — F411 Generalized anxiety disorder: Secondary | ICD-10-CM | POA: Diagnosis not present

## 2015-11-02 DIAGNOSIS — R51 Headache: Secondary | ICD-10-CM | POA: Diagnosis not present

## 2015-11-02 DIAGNOSIS — M47816 Spondylosis without myelopathy or radiculopathy, lumbar region: Secondary | ICD-10-CM

## 2015-11-02 DIAGNOSIS — I2510 Atherosclerotic heart disease of native coronary artery without angina pectoris: Secondary | ICD-10-CM | POA: Diagnosis not present

## 2015-11-02 DIAGNOSIS — Z981 Arthrodesis status: Secondary | ICD-10-CM | POA: Insufficient documentation

## 2015-11-02 DIAGNOSIS — D649 Anemia, unspecified: Secondary | ICD-10-CM | POA: Insufficient documentation

## 2015-11-02 DIAGNOSIS — G8929 Other chronic pain: Secondary | ICD-10-CM | POA: Insufficient documentation

## 2015-11-02 DIAGNOSIS — G8918 Other acute postprocedural pain: Secondary | ICD-10-CM | POA: Insufficient documentation

## 2015-11-02 DIAGNOSIS — F410 Panic disorder [episodic paroxysmal anxiety] without agoraphobia: Secondary | ICD-10-CM | POA: Insufficient documentation

## 2015-11-02 DIAGNOSIS — G43909 Migraine, unspecified, not intractable, without status migrainosus: Secondary | ICD-10-CM | POA: Diagnosis not present

## 2015-11-02 DIAGNOSIS — M47812 Spondylosis without myelopathy or radiculopathy, cervical region: Secondary | ICD-10-CM | POA: Insufficient documentation

## 2015-11-02 DIAGNOSIS — M791 Myalgia: Secondary | ICD-10-CM | POA: Insufficient documentation

## 2015-11-02 DIAGNOSIS — E785 Hyperlipidemia, unspecified: Secondary | ICD-10-CM | POA: Diagnosis not present

## 2015-11-02 DIAGNOSIS — M549 Dorsalgia, unspecified: Secondary | ICD-10-CM | POA: Diagnosis present

## 2015-11-02 MED ORDER — MIDAZOLAM HCL 5 MG/5ML IJ SOLN
INTRAMUSCULAR | Status: AC
  Administered 2015-11-02: 3 mg via INTRAVENOUS
  Filled 2015-11-02: qty 5

## 2015-11-02 MED ORDER — FENTANYL CITRATE (PF) 100 MCG/2ML IJ SOLN
INTRAMUSCULAR | Status: AC
  Administered 2015-11-02: 100 ug via INTRAVENOUS
  Filled 2015-11-02: qty 2

## 2015-11-02 MED ORDER — FENTANYL CITRATE (PF) 100 MCG/2ML IJ SOLN: 25.0000 ug | INTRAMUSCULAR | Status: DC | PRN

## 2015-11-02 MED ORDER — TRIAMCINOLONE ACETONIDE 40 MG/ML IJ SUSP
INTRAMUSCULAR | Status: AC
  Administered 2015-11-02: 12:00:00
  Filled 2015-11-02: qty 1

## 2015-11-02 MED ORDER — ROPIVACAINE HCL 2 MG/ML IJ SOLN: 9.0000 mL | Freq: Once | INTRAMUSCULAR | Status: DC

## 2015-11-02 MED ORDER — LACTATED RINGERS IV SOLN: 1000.0000 mL | Freq: Once | INTRAVENOUS | Status: DC

## 2015-11-02 MED ORDER — OXYCODONE HCL 5 MG PO TABS: 5.0000 mg | ORAL_TABLET | Freq: Every day | ORAL | Status: DC | PRN

## 2015-11-02 MED ORDER — MIDAZOLAM HCL 5 MG/5ML IJ SOLN: 1.0000 mg | INTRAMUSCULAR | Status: DC | PRN

## 2015-11-02 MED ORDER — ROPIVACAINE HCL 2 MG/ML IJ SOLN
INTRAMUSCULAR | Status: AC
  Administered 2015-11-02: 12:00:00
  Filled 2015-11-02: qty 10

## 2015-11-02 MED ORDER — TRIAMCINOLONE ACETONIDE 40 MG/ML IJ SUSP: 40.0000 mg | Freq: Once | INTRAMUSCULAR | Status: DC

## 2015-11-02 MED ORDER — LIDOCAINE HCL (PF) 1 % IJ SOLN: 10.0000 mL | Freq: Once | INTRAMUSCULAR | Status: DC

## 2015-11-02 NOTE — Patient Instructions (Signed)
Pain Management Discharge Instructions  General Discharge Instructions :  If you need to reach your doctor call: Monday-Friday 8:00 am - 4:00 pm at [phone] or toll free [phone].  After clinic hours [phone] to have operator reach doctor.  Bring all of your medication bottles to all your appointments in the pain clinic.  To cancel or reschedule your appointment with Pain Management please remember to call 24 hours in advance to avoid a fee.  Refer to the educational materials which you have been given on: General Risks, I had my Procedure. Discharge Instructions, Post Sedation.  Post Procedure Instructions:  The drugs you were given will stay in your system until tomorrow, so for the next 24 hours you should not drive, make any legal decisions or drink any alcoholic beverages.  You may eat anything you prefer, but it is better to start with liquids then soups and crackers, and gradually work up to solid foods.  Please notify your doctor immediately if you have any unusual bleeding, trouble breathing or pain that is not related to your normal pain.  Depending on the type of procedure that was done, some parts of your body may feel week and/or numb.  This usually clears up by tonight or the next day.  Walk with the use of an assistive device or accompanied by an adult for the 24 hours.  You may use ice on the affected area for the first 24 hours.  Put ice in a Ziploc bag and cover with a towel and place against area 15 minutes on 15 minutes off.  You may switch to heat after 24 hours.Radiofrequency Lesioning Radiofrequency lesioning is a procedure that is performed to relieve pain. The procedure is often used for back, neck, or arm pain. Radiofrequency lesioning involves the use of a machine that creates radio waves to make heat. During the procedure, the heat is applied to the nerve that carries the pain signal. The heat damages the nerve and interferes with the pain signal.  Pain relief usually lasts for 6 months to 1 year. LET [facility] CARE PROVIDER KNOW ABOUT:  Any allergies you have.  All medicines you are taking, including vitamins, herbs, eye drops, creams, and over-the-counter medicines.  Previous problems you or members of your family have had with the use of anesthetics.  Any blood disorders you have.  Previous surgeries you have had.  Any medical conditions you have.  Whether you are pregnant or may be pregnant. RISKS AND COMPLICATIONS Generally, this is a safe procedure. However, problems may occur, including:  Pain or soreness at the injection site.  Infection at the injection site.  Damage to nerves or blood vessels. BEFORE THE PROCEDURE  Ask your health care provider about:  Changing or stopping your regular medicines. This is especially important if you are taking diabetes medicines or blood thinners.  Taking medicines such as aspirin and ibuprofen. These medicines can thin your blood. Do not take these medicines before your procedure if your health care provider instructs you not to.  Follow instructions from your health care provider about eating or drinking restrictions.  Plan to have someone take you home after the procedure.  If you go home right after the procedure, plan to have someone with you for 24 hours. PROCEDURE  You will be given one or more of the following:  A medicine to help you relax (sedative).  A medicine to numb the area (local anesthetic).  You will be awake during the procedure. You  will need to be able to talk with the health care provider during the procedure.  With the help of a type of X-ray (fluoroscopy), the health care provider will insert a radiofrequency needle into the area to be treated.  Next, a wire that carries the radio waves (electrode) will be put through the radiofrequency needle. An electrical pulse will be sent through the electrode to verify the correct nerve. You will feel a  tingling sensation, and you may have muscle twitching.  Then, the tissue that is around the needle tip will be heated by an electric current that is passed using the radiofrequency machine. This will numb the nerves.  A bandage (dressing) will be put on the insertion area after the procedure is done. The procedure may vary among health care providers and hospitals. AFTER THE PROCEDURE  Your blood pressure, heart rate, breathing rate, and blood oxygen level will be monitored often until the medicines you were given have worn off.  Return to your normal activities as directed by your health care provider.   This information is not intended to replace advice given to you by your health care provider. Make sure you discuss any questions you have with your health care provider.   Document Released: 02/13/2011 Document Revised: 03/08/2015 Document Reviewed: 07/25/2014 Elsevier Interactive Patient Education 2016 [employer] Facet Joint Block The facet joints connect the bones of the spine (vertebrae). They make it possible for you to bend, twist, and make other movements with your spine. They also prevent you from overbending, overtwisting, and making other excessive movements.  A facet joint block is a procedure where a numbing medicine (anesthetic) is injected into a facet joint. Often, a type of anti-inflammatory medicine called a steroid is also injected. A facet joint block may be done for two reasons:   Diagnosis. A facet joint block may be done as a test to see whether neck or back pain is caused by a worn-down or infected facet joint. If the pain gets better after a facet joint block, it means the pain is probably coming from the facet joint. If the pain does not get better, it means the pain is probably not coming from the facet joint.   Therapy. A facet joint block may be done to relieve neck or back pain caused by a facet joint. A facet joint block is only done as a therapy if the pain  does not improve with medicine, exercise programs, physical therapy, and other forms of pain management. LET [facility] CARE PROVIDER KNOW ABOUT:   Any allergies you have.   All medicines you are taking, including vitamins, herbs, eyedrops, and over-the-counter medicines and creams.   Previous problems you or members of your family have had with the use of anesthetics.   Any blood disorders you have had.   Other health problems you have. RISKS AND COMPLICATIONS Generally, having a facet joint block is safe. However, as with any procedure, complications can occur. Possible complications associated with having a facet joint block include:   Bleeding.   Injury to a nerve near the injection site.   Pain at the injection site.   Weakness or numbness in areas controlled by nerves near the injection site.   Infection.   Temporary fluid retention.   Allergic reaction to anesthetics or medicines used during the procedure. BEFORE THE PROCEDURE   Follow your health care provider's instructions if you are taking dietary supplements or medicines. You may need to stop taking them  or reduce your dosage.   Do not take any new dietary supplements or medicines without asking your health care provider first.   Follow your health care provider's instructions about eating and drinking before the procedure. You may need to stop eating and drinking several hours before the procedure.   Arrange to have an adult drive you home after the procedure. PROCEDURE  You may need to remove your clothing and dress in an open-back gown so that your health care provider can access your spine.   The procedure will be done while you are lying on an X-ray table. Most of the time you will be asked to lie on your stomach, but you may be asked to lie in a different position if an injection will be made in your neck.   Special machines will be used to monitor your oxygen levels, heart rate, and blood  pressure.   If an injection will be made in your neck, an intravenous (IV) tube will be inserted into one of your veins. Fluids and medicine will flow directly into your body through the IV tube.   The area over the facet joint where the injection will be made will be cleaned with an antiseptic soap. The surrounding skin will be covered with sterile drapes.   An anesthetic will be applied to your skin to make the injection area numb. You may feel a temporary stinging or burning sensation.   A video X-ray machine will be used to locate the joint. A contrast dye may be injected into the facet joint area to help with locating the joint.   When the joint is located, an anesthetic medicine will be injected into the joint through the needle.   Your health care provider will ask you whether you feel pain relief. If you do feel relief, a steroid may be injected to provide pain relief for a longer period of time. If you do not feel relief or feel only partial relief, additional injections of an anesthetic may be made in other facet joints.   The needle will be removed, the skin will be cleansed, and bandages will be applied.  AFTER THE PROCEDURE   You will be observed for 15-30 minutes before being allowed to go home. Do not drive. Have an adult drive you or take a taxi or public transportation instead.   If you feel pain relief, the pain will return in several hours or days when the anesthetic wears off.   You may feel pain relief 2-14 days after the procedure. The amount of time this relief lasts varies from person to person.   It is normal to feel some tenderness over the injected area(s) for 2 days following the procedure.   If you have diabetes, you may have a temporary increase in blood sugar.   This information is not intended to replace advice given to you by your health care provider. Make sure you discuss any questions you have with your health care provider.   Document  Released: 11/06/2006 Document Revised: 07/08/2014 Document Reviewed: 04/06/2012 Elsevier Interactive Patient Education [employer].

## 2015-11-02 NOTE — Progress Notes (Signed)
Patient's Name: Jacqueline Thornton  Patient type: Established  MRN: [id number]  Service setting: Ambulatory outpatient  DOB: 1951-03-03  Location: [facility]  DOS: 11/02/2015  Primary Care Physician: Lavera Guise, MD  Note by: Kathlen Brunswick Dossie Arbour, M.D, DABA, DABAPM, DABPM, Milagros Evener, FIPP  Referring Physician: Milinda Pointer, MD  Specialty: Board-Certified Interventional Pain Management  Last Visit to Pain Management: 09/27/2015   Primary Reason(s) for Visit: Interventional Pain Management Treatment. CC: Back Pain  Primary Diagnosis: Facet syndrome, lumbar [M54.5]   Procedure:  Anesthesia, Analgesia, Anxiolysis:  Type: Therapeutic Medial Branch Facet Radiofrequency Ablation Region: Lumbar Level: L2, L3, L4, L5, & S1 Medial Branch Level(s) Laterality: Left-Sided  Indications: 1. Lumbar facet syndrome (Bilateral) (R>L)   2. Lumbar spondylosis, unspecified spinal osteoarthritis   3. Chronic low back pain (Location of Secondary source of pain) (Bilateral) (midline to tailbone) (R>L)   4. Acute postoperative pain (s/p RFA)     Pre-procedure Pain Score: 5/10 Reported level of pain is compatible with clinical observations Post-procedure Pain Score: 2   The patient has failed to respond to conservative therapies including over-the-counter medications, anti-inflammatories, muscle relaxants, membrane stabilizers, opioids, physical therapy, modalities such as heat and ice, as well as more invasive techniques such as nerve blocks. The patient did attained more than 50% relief of the pain from a series of diagnostic injections conducted in separate occasions.  Type: Moderate (Conscious) Sedation & Local Anesthesia Local Anesthetic: Lidocaine 1% Route: Intravenous (IV) IV Access: Secured Sedation: Meaningful verbal contact was maintained at all times during the procedure  Indication(s): Analgesia & Anxiolysis   Pre-Procedure Assessment:  Jacqueline Thornton is a 65 y.o. year  old, female patient, seen today for interventional treatment. She has Chronic pain syndrome; Absolute anemia; Gout; HLD (hyperlipidemia); Headache, migraine; Panic attack; Chronic low back pain (Location of Secondary source of pain) (Bilateral) (midline to tailbone) (R>L); Lumbar spondylosis;  Lumbar annular disc tear (L4-5); Discogenic low back pain (L3-4 and L4-5); Lumbar facet hypertrophy; Lumbar facet syndrome (Bilateral) (R>L); Chronic neck pain; Cervical spondylosis; Hx of cervical spine surgery; Cervical spinal fusion (C6-7 interbody fusion); Coronary atherosclerosis; Long term current use of opiate analgesic; Encounter for therapeutic drug level monitoring; Uncomplicated opioid dependence ([place]); Opiate use (56.25 MME/Day); Myofascial pain syndrome; CAD in native artery; Atypical face pain; Anxiety, generalized; Muscle ache; Chronic pain; Chronic sacroiliac joint pain; Chronic lumbar radicular pain (Location of Primary Source of Pain) (Left) (S1 Dermatome); Long term prescription opiate use; Encounter for chronic pain management; Trochanteric bursitis of right hip; Chronic lower extremity pain (Location of Primary Source of Pain) (Bilateral) (L>R); Neurogenic pain; Myofascial pain; and Acute postoperative pain (s/p RFA) on her problem list.. Her primarily concern today is the Back Pain   Pain Location: Back Pain Orientation: Lower, Right, Left Pain Descriptors / Indicators: Constant, Aching, Throbbing (pt mowed yard yesterday on riding mower, bumpy-husband sick- made back pain worse) Pain Frequency: Constant  Date of Last Visit: 09/21/15 Service Provided on Last Visit: Procedure, Evaluation  Verification of the correct person, correct site (including marking of site), and correct procedure were performed and confirmed by the patient.  Consent: Secured. Under the influence of no sedatives a written informed consent was obtained, after having provided information on the risks and possible  complications. To fulfill our ethical and legal obligations, as recommended by the American Medical Association's Code of Ethics, we have provided information to the patient about our clinical impression; the nature and purpose of the treatment or procedure;  the risks, benefits, and possible complications of the intervention; alternatives; the risk(s) and benefit(s) of the alternative treatment(s) or procedure(s); and the risk(s) and benefit(s) of doing nothing. The patient was provided information about the risks and possible complications associated with the procedure. These include, but are not limited to, failure to achieve desired goals, infection, bleeding, organ or nerve damage, allergic reactions, paralysis, and death. In the case of spinal procedures these may include, but are not limited to, failure to achieve desired goals, infection, bleeding, organ or nerve damage, allergic reactions, paralysis, and death. In addition, the patient was informed that Medicine is not an exact science; therefore, there is also the possibility of unforeseen risks and possible complications that may result in a catastrophic outcome. The patient indicated having understood very clearly. We have given the patient no guarantees and we have made no promises. Enough time was given to the patient to ask questions, all of which were answered to the patient's satisfaction.  Consent Attestation: I, the ordering provider, attest that I have discussed with the patient the benefits, risks, side-effects, alternatives, likelihood of achieving goals, and potential problems during recovery for the procedure that I have provided informed consent.  Pre-Procedure Preparation: Safety Precautions: Allergies reviewed. Appropriate site, procedure, and patient were confirmed by following the Joint Commission's Universal Protocol (UP.01.01.01), in the form of a "Time Out". The patient was asked to confirm marked site and procedure, before  commencing. The patient was asked about blood thinners, or active infections, both of which were denied. Patient was assessed for positional comfort and all pressure points were checked before starting procedure. Allergies: She has No Known Allergies.. Infection Control Precautions: Aseptic technique used. Standard Universal Precautions were taken as recommended by the Department of [facility] for Disease Control and Prevention (CDC). Standard pre-surgical skin prep was conducted. Respiratory hygiene and cough etiquette was practiced. Hand hygiene observed. Safe injection practices and needle disposal techniques followed. SDV (single dose vial) medications used. Medications properly checked for expiration dates and contaminants. Personal protective equipment (PPE) used: Sterile Radiation-resistant gloves. Monitoring:  As per clinic protocol. Filed Vitals:   11/02/15 1235 11/02/15 1242 11/02/15 1252 11/02/15 1302  BP: 124/69 109/55 112/54 117/56  Pulse: 79 74 69 70  Temp:    97.7 F (36.5 C)  TempSrc:    Temporal  Resp: 15 16 16 16   Height:      Weight:      SpO2: 100% 99% 97% 99%  Calculated BMI: Body mass index is 27.22 kg/(m^2).  Description of Procedure Process:   Time-out: "Time-out" completed before starting procedure, as per protocol. Position: Prone Target Area: For Lumbar Facet blocks, the target is the groove formed by the junction of the transverse process and superior articular process. For the L5 dorsal ramus, the target is the notch between superior articular process and sacral ala. For the S1 dorsal ramus, the target is the superior and lateral edge of the posterior S1 Sacral foramen. Approach: Paraspinal approach. Area Prepped: Entire Posterior Lumbosacral Region Prepping solution: ChloraPrep (2% chlorhexidine gluconate and 70% isopropyl alcohol) Safety Precautions: Aspiration looking for blood return was conducted prior to all injections. At no point did we inject  any substances, as a needle was being advanced. No attempts were made at seeking any paresthesias. Safe injection practices and needle disposal techniques used. Medications properly checked for expiration dates. SDV (single dose vial) medications used.   Description of the Procedure: Protocol guidelines were followed. The patient was placed in  position over the fluoroscopy table. The target area was identified and the area prepped in the usual manner. Skin desensitized using vapocoolant spray. Skin & deeper tissues infiltrated with local anesthetic. Appropriate amount of time allowed to pass for local anesthetics to take effect. Radiofrequency needles were introduced to the area of the medial branch at the junction of the superior articular process and transverse process using fluoroscopy. Using the [employer], sensory stimulation using 50 Hz was used to locate & identify the nerve, making sure that the needle was positioned such that there was no sensory stimulation below 0.3 V or above 0.7 V. Stimulation using 2 Hz was used to evaluate the motor component. Care was taken not to lesion any nerves that demonstrated motor stimulation of the lower extremities at an output of less than 2.5 times that of the sensory threshold, or a maximum of 2.0 V. Once satisfactory placement of the needles was achieved, the above solution was slowly injected after negative aspiration. After waiting for at least 2 minutes, the ablation was performed at 80 degrees C for 60 seconds.The needles were then removed and the area cleansed, making sure to leave some of the prepping solution back to take advantage of its long term bactericidal properties. EBL: None Materials & Medications Used:  Needle(s) Used: Teflon-Coated Radiofrequency Needles Medications Administered today: We administered ropivacaine (PF) 2 mg/ml (0.2%), midazolam, fentaNYL, and triamcinolone acetonide.Please see chart orders for dosing  details.  Imaging Guidance:   Type of Imaging Technique: Fluoroscopy Guidance (Spinal) Indication(s): Assistance in needle guidance and placement for procedures requiring needle placement in or near specific anatomical locations not easily accessible without such assistance. Exposure Time: Please see nurses notes. Contrast: None required. Fluoroscopic Guidance: I was personally present in the fluoroscopy suite, where the patient was placed in position for the procedure, over the fluoroscopy-compatible table. Fluoroscopy was manipulated, using "Tunnel Vision Technique", to obtain the best possible view of the target area, on the affected side. Parallax error was corrected before commencing the procedure. A "direction-depth-direction" technique was used to introduce the needle under continuous pulsed fluoroscopic guidance. Once the target was reached, antero-posterior, oblique, and lateral fluoroscopic projection views were taken to confirm needle placement in all planes. Permanently recorded images stored by scanning into EMR. Interpretation: Intraoperative imaging interpretation by performing Physician. Adequate needle placement confirmed.  Antibiotic Prophylaxis:  Indication(s): No indications identified. Type:  Antibiotics Given (last 72 hours)    None       Post-operative Assessment:   Complications: No immediate post-treatment complications were observed Disposition: Return to clinic for follow-up evaluation. The patient tolerated the entire procedure well. A repeat set of vitals were taken after the procedure and the patient was kept under observation following institutional policy, for this procedure. Post-procedural neurological assessment was performed, showing return to baseline, prior to discharge. The patient was discharged home, once institutional criteria were met. The patient was provided with post-procedure discharge instructions, including a section on how to identify potential  problems. Should any problems arise concerning this procedure, the patient was given instructions to immediately contact us, at any time, without hesitation. In any case, we plan to contact the patient by telephone for a follow-up status report regarding this interventional procedure. Comments:  No additional relevant information  Medications administered during this visit: We administered ropivacaine (PF) 2 mg/ml (0.2%), midazolam, fentaNYL, and triamcinolone acetonide.  Prescriptions ordered during this visit: New Prescriptions   OXYCODONE (OXY IR/ROXICODONE) 5 MG IMMEDIATE RELEASE TABLET  Take 1 tablet (5 mg total) by mouth daily as needed for breakthrough pain.    Future Appointments Date Time Provider [place]  12/27/2015 1:40 PM Milinda Pointer, MD [facility] None    Primary Care Physician: Lavera Guise, MD Location: [facility] Note by: Kathlen Brunswick. Dossie Arbour, M.D, DABA, DABAPM, DABPM, DABIPP, FIPP  Disclaimer:  Medicine is not an exact science. The only guarantee in medicine is that nothing is guaranteed. It is important to note that the decision to proceed with this intervention was based on the information collected from the patient. The Data and conclusions were drawn from the patient's questionnaire, the interview, and the physical examination. Because the information was provided in large part by the patient, it cannot be guaranteed that it has not been purposely or unconsciously manipulated. Every effort has been made to obtain as much relevant data as possible for this evaluation. It is important to note that the conclusions that lead to this procedure are derived in large part from the available data. Always take into account that the treatment will also be dependent on availability of resources and existing treatment guidelines, considered by other Pain Management Practitioners as being common knowledge and practice, at the time of the  intervention. For Medico-Legal purposes, it is also important to point out that variation in procedural techniques and pharmacological choices are the acceptable norm. The indications, contraindications, technique, and results of the above procedure should only be interpreted and judged by a Board-Certified Interventional Pain Specialist with extensive familiarity and expertise in the same exact procedure and technique. Attempts at providing opinions without similar or greater experience and expertise than that of the treating physician will be considered as inappropriate and unethical, and shall result in a formal complaint to the state medical board and applicable specialty societies.

## 2015-11-02 NOTE — Progress Notes (Signed)
Safety precautions to be maintained throughout the outpatient stay will include: orient to surroundings, keep bed in low position, maintain call bell within reach at all times, provide assistance with transfer out of bed and ambulation.  Here today for procedure- RF

## 2015-11-03 ENCOUNTER — Telehealth: Payer: Self-pay

## 2015-11-03 NOTE — Telephone Encounter (Signed)
Post procedure phone call.  States she is doing ok.   

## 2015-11-22 ENCOUNTER — Ambulatory Visit: Payer: Medicare PPO | Attending: Internal Medicine

## 2015-12-27 ENCOUNTER — Ambulatory Visit: Payer: Medicare PPO | Attending: Pain Medicine | Admitting: Pain Medicine

## 2015-12-27 ENCOUNTER — Encounter: Payer: Self-pay | Admitting: Pain Medicine

## 2015-12-27 ENCOUNTER — Other Ambulatory Visit
Admission: RE | Admit: 2015-12-27 | Discharge: 2015-12-27 | Disposition: A | Discharge status: Home / Self Care | Payer: Medicare PPO | Source: Ambulatory Visit | Attending: Pain Medicine | Admitting: Pain Medicine

## 2015-12-27 VITALS — BP 116/66 | HR 88 | Temp 98.3°F | Resp 16 | Ht 60.0 in | Wt 142.0 lb

## 2015-12-27 DIAGNOSIS — M1000 Idiopathic gout, unspecified site: Secondary | ICD-10-CM | POA: Diagnosis not present

## 2015-12-27 DIAGNOSIS — M7918 Myalgia, other site: Secondary | ICD-10-CM

## 2015-12-27 DIAGNOSIS — D649 Anemia, unspecified: Secondary | ICD-10-CM | POA: Insufficient documentation

## 2015-12-27 DIAGNOSIS — G43909 Migraine, unspecified, not intractable, without status migrainosus: Secondary | ICD-10-CM | POA: Insufficient documentation

## 2015-12-27 DIAGNOSIS — M545 Low back pain, unspecified: Secondary | ICD-10-CM

## 2015-12-27 DIAGNOSIS — E785 Hyperlipidemia, unspecified: Secondary | ICD-10-CM | POA: Diagnosis not present

## 2015-12-27 DIAGNOSIS — F1190 Opioid use, unspecified, uncomplicated: Secondary | ICD-10-CM | POA: Diagnosis not present

## 2015-12-27 DIAGNOSIS — Z79891 Long term (current) use of opiate analgesic: Secondary | ICD-10-CM | POA: Diagnosis not present

## 2015-12-27 DIAGNOSIS — M791 Myalgia: Secondary | ICD-10-CM

## 2015-12-27 DIAGNOSIS — M109 Gout, unspecified: Secondary | ICD-10-CM | POA: Insufficient documentation

## 2015-12-27 DIAGNOSIS — M47816 Spondylosis without myelopathy or radiculopathy, lumbar region: Secondary | ICD-10-CM | POA: Diagnosis not present

## 2015-12-27 DIAGNOSIS — M5416 Radiculopathy, lumbar region: Secondary | ICD-10-CM

## 2015-12-27 DIAGNOSIS — G8929 Other chronic pain: Secondary | ICD-10-CM | POA: Diagnosis not present

## 2015-12-27 DIAGNOSIS — F419 Anxiety disorder, unspecified: Secondary | ICD-10-CM | POA: Diagnosis not present

## 2015-12-27 DIAGNOSIS — M79606 Pain in leg, unspecified: Secondary | ICD-10-CM | POA: Diagnosis not present

## 2015-12-27 DIAGNOSIS — M5136 Other intervertebral disc degeneration, lumbar region with discogenic back pain only: Secondary | ICD-10-CM

## 2015-12-27 DIAGNOSIS — Z981 Arthrodesis status: Secondary | ICD-10-CM

## 2015-12-27 DIAGNOSIS — M7061 Trochanteric bursitis, right hip: Secondary | ICD-10-CM

## 2015-12-27 DIAGNOSIS — M5021 Other cervical disc displacement,  high cervical region: Secondary | ICD-10-CM | POA: Insufficient documentation

## 2015-12-27 DIAGNOSIS — M792 Neuralgia and neuritis, unspecified: Secondary | ICD-10-CM

## 2015-12-27 DIAGNOSIS — M5126 Other intervertebral disc displacement, lumbar region: Secondary | ICD-10-CM | POA: Insufficient documentation

## 2015-12-27 DIAGNOSIS — M542 Cervicalgia: Secondary | ICD-10-CM | POA: Diagnosis not present

## 2015-12-27 DIAGNOSIS — Z5181 Encounter for therapeutic drug level monitoring: Secondary | ICD-10-CM

## 2015-12-27 DIAGNOSIS — M2578 Osteophyte, vertebrae: Secondary | ICD-10-CM | POA: Diagnosis not present

## 2015-12-27 DIAGNOSIS — F410 Panic disorder [episodic paroxysmal anxiety] without agoraphobia: Secondary | ICD-10-CM | POA: Insufficient documentation

## 2015-12-27 DIAGNOSIS — M47812 Spondylosis without myelopathy or radiculopathy, cervical region: Secondary | ICD-10-CM | POA: Diagnosis not present

## 2015-12-27 DIAGNOSIS — M533 Sacrococcygeal disorders, not elsewhere classified: Secondary | ICD-10-CM

## 2015-12-27 DIAGNOSIS — M25559 Pain in unspecified hip: Secondary | ICD-10-CM | POA: Diagnosis present

## 2015-12-27 DIAGNOSIS — M549 Dorsalgia, unspecified: Secondary | ICD-10-CM | POA: Diagnosis present

## 2015-12-27 LAB — VITAMIN B12: Vitamin B-12: 161 pg/mL — ABNORMAL LOW (ref 180–914)

## 2015-12-27 MED ORDER — MELOXICAM 15 MG PO TABS: 15.0000 mg | ORAL_TABLET | Freq: Every day | ORAL | Status: DC

## 2015-12-27 MED ORDER — METHOCARBAMOL 750 MG PO TABS: 750.0000 mg | ORAL_TABLET | Freq: Four times a day (QID) | ORAL | Status: DC

## 2015-12-27 MED ORDER — OXYCODONE-ACETAMINOPHEN 7.5-325 MG PO TABS: 1.0000 | ORAL_TABLET | Freq: Every day | ORAL | Status: DC | PRN

## 2015-12-27 MED ORDER — GABAPENTIN 300 MG PO CAPS: 300.0000 mg | ORAL_CAPSULE | Freq: Two times a day (BID) | ORAL | Status: DC

## 2015-12-27 NOTE — Progress Notes (Signed)
Safety precautions to be maintained throughout the outpatient stay will include: orient to surroundings, keep bed in low position, maintain call bell within reach at all times, provide assistance with transfer out of bed and ambulation. Percocet pill count #12/150  Filled 12-03-15

## 2015-12-27 NOTE — Progress Notes (Signed)
Patient's Name: Jacqueline Thornton  Patient type: Established  MRN: [id number]  Service setting: Ambulatory outpatient  DOB: [birth year]  Location: [facility]  DOS: 12/27/2015  Primary Care Physician: Lavera Guise, MD  Note by: Kathlen Brunswick Dossie Arbour, M.D, DABA, Sarita Haver, DABPM, Milagros Evener, [place]  Referring Physician: Lavera Guise, MD  Specialty: Board-Certified Interventional Pain Management  Last Visit to Pain Management: 11/03/2015   Primary Reason(s) for Visit: Encounter for prescription drug management & post-procedure evaluation of chronic illness with mild to moderate exacerbation(Level of risk: moderate) CC: Back Pain and Hip Pain   HPI  Jacqueline Thornton is a 65 y.o. year old, female patient, who returns today as an established patient. She has Chronic pain syndrome; Absolute anemia; Gout; HLD (hyperlipidemia); Headache, migraine; Panic attack; Chronic low back pain (Location of Secondary source of pain) (Bilateral) (midline to tailbone) (R>L); Lumbar spondylosis;  Lumbar annular disc tear (L4-5); Discogenic low back pain (L3-4 and L4-5); Lumbar facet hypertrophy; Lumbar facet syndrome (Bilateral) (R>L); Chronic neck pain; Cervical spondylosis; Hx of cervical spine surgery; Cervical spinal fusion (C6-7 interbody fusion); Coronary atherosclerosis; Long term current use of opiate analgesic; Encounter for therapeutic drug level monitoring; Uncomplicated opioid dependence ([place]); Opiate use (56.25 MME/Day); Myofascial pain syndrome; CAD in native artery; Atypical face pain; Anxiety, generalized; Muscle ache; Chronic pain; 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 Her primarily concern today is the Back Pain and Hip Pain   Pain Assessment: Self-Reported Pain Score: 4  Reported level is compatible with observation       Pain Type: Chronic pain Pain Location: Back Pain Orientation: Lower Pain Descriptors / Indicators: Constant, Aching, Throbbing Pain Frequency: Constant  The patient comes into the clinics today for post-procedure evaluation on the interventional treatment done on 11/02/2015. In addition, she comes in today for pharmacological management of her chronic pain.  The patient  reports that she does not use illicit drugs.  Date of Last Visit: 11/02/15 Service Provided on Last Visit: Procedure (left lumbar facet rf)  Controlled Substance Pharmacotherapy Assessment & REMS (Risk Evaluation and Mitigation Strategy)  Analgesic: Oxycodone 7.5/325 one tablet 5 times a day. (56.25 mg/day) MME/day: 56.25 mg/day Pill Count: Percocet pill count #12/150 Filled 12-03-15. Pharmacokinetics: Onset of action (Liberation/Absorption): Within expected pharmacological parameters Time to Peak effect (Distribution): Timing and results are as within normal expected parameters Duration of action (Metabolism/Excretion): Within normal limits for medication Pharmacodynamics: Analgesic Effect: More than 50% Activity Facilitation: Medication(s) allow patient to sit, stand, walk, and do the basic ADLs Perceived Effectiveness: Described as relatively effective, allowing for increase in activities of daily living (ADL) Side-effects or Adverse reactions: None reported Monitoring: [place] PMP: Online review of the past 40-month period conducted. Compliant with practice rules and regulations Last UDS on record: TOXASSURE SELECT 13  Date Value Ref Range Status  09/27/2015 FINAL  Final    Comment:    ==================================================================== TOXASSURE SELECT 13  (MW) ==================================================================== Test                             Result       Flag  Units Drug Present and Declared for Prescription Verification   Oxycodone                      2337         EXPECTED   ng/mg creat   Oxymorphone                    368          EXPECTED   ng/mg creat   Noroxycodone                   >3802        EXPECTED   ng/mg creat   Noroxymorphone                 372          EXPECTED   ng/mg creat    Sources of oxycodone are scheduled prescription medications.    Oxymorphone, noroxycodone, and noroxymorphone are expected    metabolites of oxycodone. Oxymorphone is also available as a    scheduled prescription medication. ==================================================================== Test                      Result    Flag   Units      Ref Range   Creatinine              263              mg/dL      >=20 ==================================================================== Declared Medications:  The flagging and interpretation on this report are based on the  following declared medications.  Unexpected results may arise from  inaccuracies in the declared medications.  **Note: The testing scope of this panel includes these medications:  Oxycodone (Percocet)  **Note: The testing scope of this panel does not include following  reported medications:  Acetaminophen (Percocet)  Duloxetine (Cymbalta)  Enalapril (Vasotec)  Estradiol (Estrace)  Fenofibrate  Gabapentin  Meloxicam (Mobic)  Methocarbamol (Robaxin)  Naloxone  Omeprazole (Nexium)  Vitamin D ==================================================================== For clinical consultation, please call [phone]. ====================================================================    UDS interpretation: Compliant          Medication Assessment Form: Reviewed. Patient indicates being compliant with therapy Treatment compliance: Compliant Risk  Assessment: Aberrant Behavior: None observed today Substance Use Disorder (SUD) Risk Level: Low Risk of opioid abuse or dependence: 0.7-3.0% with doses ? 36 MME/day and 6.1-26% with doses ? 120 MME/day. Opioid Risk Tool (ORT) Score:  4 Low Risk for SUD (Score <3) Depression Scale Score: PHQ-2: PHQ-2 Total Score: 0 No depression (0) PHQ-9: PHQ-9 Total Score: 0 No depression (0-4)  Pharmacologic Plan: No change in therapy, at this time  Post-Procedure Assessment  Procedure done on last visit: Left lumbar facet radiofrequency ablation under fluoroscopic guidance and IV sedation. Side-effects or Adverse reactions: None reported Sedation: Sedation given  Results: Ultra-Short Term Relief (First 1 hour after procedure): 100 %  Analgesia during this period is likely to be Local Anesthetic and/or IV Sedative (Analgesic/Anxiolitic) related Short Term Relief (Initial 4-6 hrs after procedure): 100 % Complete relief confirms area to be the source of pain Long Term Relief : 25 % Long-term benefit would suggest an inflammatory etiology to the pain   Current Relief (Now): 25%  Long-term benefit could signal adequate neurolysis Interpretation of Results: We have not completed to the right side at this point and therefore she is not getting together for benefit until we do. We will  be scheduling this as soon as possible.  Laboratory Chemistry  Inflammation Markers Lab Results  Component Value Date   ESRSEDRATE 8 08/23/2015   CRP <0.5 08/23/2015    Renal Function Lab Results  Component Value Date   BUN 15 08/23/2015   CREATININE 0.77 08/23/2015   GFRAA >60 08/23/2015   GFRNONAA >60 08/23/2015    Hepatic Function Lab Results  Component Value Date   AST 16 08/23/2015   ALT 13* 08/23/2015   ALBUMIN 3.9 08/23/2015    Electrolytes Lab Results  Component Value Date   NA 140 08/23/2015   K 3.7 08/23/2015   CL 106 08/23/2015   CALCIUM 9.1 08/23/2015   MG 2.2 08/23/2015    Pain  Modulating Vitamins Lab Results  Component Value Date   25OHVITD1 28* 12/27/2015   25OHVITD2 <1.0 12/27/2015   25OHVITD3 28 12/27/2015   VITAMINB12 161* 12/27/2015    Coagulation Parameters Lab Results  Component Value Date   INR 1.0 03/02/2014   LABPROT 12.7 03/02/2014   PLT 197 01/26/2012    Cardiovascular Lab Results  Component Value Date   HGB 14.2 01/26/2012   HCT 41.1 01/26/2012    Note: Labs reviewed.  Recent Diagnostic Imaging  Mr Lumbar Spine Wo Contrast  09/18/2015  CLINICAL DATA:  Lumbar radiculopathy. Low back pain radiating down the left leg to the ankle for 2 months. EXAM: MRI LUMBAR SPINE WITHOUT CONTRAST TECHNIQUE: Multiplanar, multisequence MR imaging of the lumbar spine was performed. No intravenous contrast was administered. COMPARISON:  04/14/2014 FINDINGS: Vertebral alignment is normal. Vertebral body heights are preserved. There is diffuse lumbar disc desiccation. Slight disc space narrowing is present at L3-4. Minimal type 1 degenerative endplate changes are noted at L3-4 and L5-S1. The conus medullaris is normal in signal and terminates at L1. Paraspinal soft tissues are unremarkable. T12-L1 and L1-2:  Negative. L2-3:  Minimal disc bulging without stenosis, unchanged. L3-4: Mild disc bulging and slight facet hypertrophy without stenosis, unchanged. L4-5: Minimal disc bulging and mild facet hypertrophy without stenosis, unchanged. L5-S1: Minimal disc bulging and mild facet hypertrophy without stenosis, unchanged. IMPRESSION: Mild lumbar disc and facet degeneration without stenosis or significant interval change. Electronically Signed   By: Logan Bores M.D.   On: 09/18/2015 17:09   Cervical Imaging: Cervical MR wo contrast:  Results for orders placed in visit on 09/28/98  MR Cervical Spine Wo Contrast   Narrative FINDINGS CLINICAL DATA:   CERVICAL HNP.  PAIN ON RIGHT SIDE OF NECK, LEFT POSTERIOR SCAPULA, AND RIGHT FOREARM. CERVICAL SPINE: VERTEBRAL BODY  FORAMINA IS SATISFACTORY.  THE PATIENT HAS UNDERGONE ANTERIOR CERVICAL DISCECTOMY WITH INTERBODY FUSION AUGMENTED WITH ANTERIOR PLATE FIXATION.  OBLIQUE VIEWS SHOW NO SIGNIFICANT FORAMINAL NARROWING.  AP AND ODONTOID VIEWS ARE UNREMARKABLE.  FUSION APPEARS SOLID. IMPRESSION   Cervical MR w/wo contrast:  Results for orders placed during the hospital encounter of 03/03/12  MR Cervical Spine W Wo Contrast   Narrative *RADIOLOGY REPORT*  Clinical Data:  Headache.  Neck pain.  Previous neck surgery.  BUN and creatinine were obtained on site at [facility] at [address] Results:  BUN 10 mg/dL,  Creatinine 1.0 mg/dL.  MRI HEAD WITHOUT AND WITH CONTRAST MRI CERVICAL SPINE WITHOUT AND WITH CONTRAST  Technique:  Multiplanar, multiecho pulse sequences of the brain and surrounding structures, and cervical spine, to include the craniocervical junction and cervicothoracic junction, were obtained without and with intravenous contrast.  Contrast: 31mL MULTIHANCE GADOBENATE DIMEGLUMINE 529 MG/ML IV SOLN,  Comparison:  Radiography 02/10/2012.  CT 04/16/2011.  MRI 05/02/2009.  MRI HEAD  Findings:  The brain has a normal appearance without evidence of malformation, significant atrophy, hemorrhage, hydrocephalus or extra-axial collection.  No pituitary mass.  No inflammatory sinus disease.  No abnormal contrast enhancement.  No skull base lesion.  IMPRESSION: Normal examination.  No cause of headache identified.  MRI CERVICAL SPINE  Findings: The foramen magnum is widely patent.  C1-2 is normal.  At C2-3, the disc bulges minimally.  No stenosis of the canal or foramina.  I think there are mild facet degenerative changes on the right.  C3-4:  Spondylosis with endplate osteophytes and shallow protrusion of disc material.  Narrowing of the ventral subarachnoid space but no compression of the cord.  Mild facet degeneration bilaterally.  C4 through C7:  Previous fusion.   Anterior plate from [id number]. Posterior fusion at C5-6.  Fusion appears grossly solid.  Apparent sufficient patency of the canal and foramina.  C7-T1:  Mild bulging of the disc.  Mild facet degeneration.  No stenosis.  Incidental hemangioma in the right to posterior body/pedicle of T1.  No abnormal cord signal.  IMPRESSION: Satisfactory appearance of the fusion segment from C4-C7.  C2-3:  Minimal disc bulge.  Facet degeneration right more than left.  No stenosis.  C3-4:  Endplate osteophytes and shallow protrusion of disc material.  Narrowing of the ventral subarachnoid space but no compression of the cord.  Mild facet degeneration right more than left.  No apparent compressive stenosis.  C7-T1:  Mild facet degeneration.  Mild bulging of the disc.  No compressive stenosis.  Since previous exams, degenerative changes at C2-3, C3-4 and C7-T1 have worsened minimally.   Original Report Authenticated By: Jules Schick, M.D.    Cervical CT wo contrast:  Results for orders placed in visit on 11/01/98  CT Cervical Spine Wo Contrast   Narrative FINDINGS CLINICAL DATA:   THE PATIENT HAS RIGHT SHOULDER PAIN, RIGHT HIP, AND LEFT ARM PAIN. MYELOGRAMS: A LUMBAR SPINAL INJECTION WAS PERFORMED BY DR. Vertell Limber WITH [facility].   LUMBAR MYELOGRAM WAS INITIALLY PERFORMED FOLLOWED BY A CERVICAL MYELOGRAM. LUMBAR: LUMBAR MYELOGRAM SHOWS FIVE LUMBAR TYPE VERTEBRAE.  ALIGNMENT IS ANATOMIC.   THE NERVE ROOTS SHOW NORMAL FILLING.  THERE IS NO EXTRADURAL DEFECTS. IMPRESSION NORMAL LUMBAR MYELOGRAM. CERVICAL: CONTRAST WAS SUBSEQUENTLY DUMPED INTO THE CERVICAL REGION.  WE EVALUATED THE THORACIC SPINE DURING THIS PROCEDURE.   WITHIN THE THORACIC SPINE, THE THECAL SAC HAS A NORMAL APPEARANCE.  THERE IS NO EXTRADURAL DEFECTS IDENTIFIED. CERVICAL MYELOGRAM IMAGES WERE SUBSEQUENTLY OBTAINED.   POST-SURGICAL CHANGES SECONDARY TO INTERBODY FUSION AT C6-7 ARE DEMONSTRATED.  THE PATIENT WAS FUSED  WITH ANTERIOR SCREW-PLATE FIXATION AND INTERBODY PLUG.   THERE IS NO EXTRADURAL DEFECT AT THE LEVEL OF THE FUSION.  THE CERVICAL MYELOGRAM HAS A NORMAL APPEARANCE WITHOUT NERVE ROOT CUT-OFF. IMPRESSION NORMAL CERVICAL MYELOGRAM. POST-MYELOGRAM CT - LUMBAR SPINE: CT SCAN OF THE LUMBAR SPINE IS PERFORMED FROM L2 TO S1 WITH CONTIGUOUS AXIAL SECTIONS. L2-3:     NORMAL INTERSPACE. L3-4:     NORMAL INTERSPACE. L4-5:     NORMAL INTERSPACE. L5-S1:   NORMAL INTERSPACE. INCIDENTAL NOTE IS MADE OF INCOMPLETE FUSION OF THE POSTERIOR ELEMENTS AT L5. IMPRESSION NORMAL LUMBAR POST-MYELOGRAM CT. POST-MYELOGRAM CT - CERVICAL SPINE: CONTIGUOUS AXIAL SECTIONS EXTEND INTO THE CERVICAL SPINE. C2-3:    NORMAL INTERSPACE. C3-4:    NORMAL INTERSPACE. C4-5:    NORMAL INTERSPACE. C5-6:    NORMAL INTERSPACE. C6-7:  POST-SURGICAL CHANGE IS DEMONSTRATED.   THE BONE PLUG AND HARDWARE APPEAR WELL POSITIONED.  THERE IS NO FORAMINAL STENOSIS.  THERE IS NO MASS EFFECT UPON THE SUBARACHNOID SPACE. C7-T1: NORMAL INTERSPACE. IMPRESSION FUSION IN CERVICAL SPINE HAS A NORMAL APPEARANCE.   THE EXAMINATION IS OTHERWISE UNREMARKABLE.   Cervical CT w contrast:  Results for orders placed during the hospital encounter of 04/16/11  CT Cervical Spine W Contrast   Narrative *RADIOLOGY REPORT*  Clinical Data:  Neck pain.  History of cervical fusion  CT MYELOGRAPHY CERVICAL SPINE  Technique:  CT imaging of the cervical spine was performed after intrathecal contrast administration. Multiplanar CT image reconstructions were also generated.  Comparison:  Cervical MRI 05/02/2009  Findings:  ACDF C4-5, C5-6 and  C6-7.  Posterior screw and rod fusion at C5-6 bilaterally.  Normal cervical alignment.  Negative for fracture.  Cervical spinal cord has normal morphology.  C2-3:  Negative  C3-4:  Small central disc protrusion without cord deformity or stenosis.  Foramina are patent.  C4-5:  ACDF.  Solid interbody bony fusion.   No significant stenosis.  C5-6:  ACDF.  Posterior hardware fusion.  No significant stenosis. Solid interbody bone fusion.  C6-7:  Interbody bone graft appears well incorporated.  Anterior plate has been removed from this level.  No significant stenosis. Posterior bone graft material is present.  No cord deformity.  C7-T1:  Negative  IMPRESSION: Small central disc protrusion at C3-4 without spinal stenosis.  Solid fusion at C4-5, C5-6, and C6-7 without significant spinal stenosis.  Original Report Authenticated By: Truett Perna, M.D.   Cervical DG complete:  Results for orders placed in visit on 09/28/98  DG Cervical Spine Complete   Narrative FINDINGS CLINICAL DATA:   CERVICAL HNP.  PAIN ON RIGHT SIDE OF NECK, LEFT POSTERIOR SCAPULA, AND RIGHT FOREARM. CERVICAL SPINE: VERTEBRAL BODY FORAMINA IS SATISFACTORY.  THE PATIENT HAS UNDERGONE ANTERIOR CERVICAL DISCECTOMY WITH INTERBODY FUSION AUGMENTED WITH ANTERIOR PLATE FIXATION.  OBLIQUE VIEWS SHOW NO SIGNIFICANT FORAMINAL NARROWING.  AP AND ODONTOID VIEWS ARE UNREMARKABLE.  FUSION APPEARS SOLID. IMPRESSION   Cervical DG Myelogram views:  Results for orders placed in visit on 11/01/98  DG Myelogram Cervical   Narrative FINDINGS CLINICAL DATA:   THE PATIENT HAS RIGHT SHOULDER PAIN, RIGHT HIP, AND LEFT ARM PAIN. MYELOGRAMS: A LUMBAR SPINAL INJECTION WAS PERFORMED BY DR. Vertell Limber WITH [facility].   LUMBAR MYELOGRAM WAS INITIALLY PERFORMED FOLLOWED BY A CERVICAL MYELOGRAM. LUMBAR: LUMBAR MYELOGRAM SHOWS FIVE LUMBAR TYPE VERTEBRAE.  ALIGNMENT IS ANATOMIC.   THE NERVE ROOTS SHOW NORMAL FILLING.  THERE IS NO EXTRADURAL DEFECTS. IMPRESSION NORMAL LUMBAR MYELOGRAM. CERVICAL: CONTRAST WAS SUBSEQUENTLY DUMPED INTO THE CERVICAL REGION.  WE EVALUATED THE THORACIC SPINE DURING THIS PROCEDURE.   WITHIN THE THORACIC SPINE, THE THECAL SAC HAS A NORMAL APPEARANCE.  THERE IS NO EXTRADURAL DEFECTS IDENTIFIED. CERVICAL MYELOGRAM  IMAGES WERE SUBSEQUENTLY OBTAINED.   POST-SURGICAL CHANGES SECONDARY TO INTERBODY FUSION AT C6-7 ARE DEMONSTRATED.  THE PATIENT WAS FUSED WITH ANTERIOR SCREW-PLATE FIXATION AND INTERBODY PLUG.   THERE IS NO EXTRADURAL DEFECT AT THE LEVEL OF THE FUSION.  THE CERVICAL MYELOGRAM HAS A NORMAL APPEARANCE WITHOUT NERVE ROOT CUT-OFF. IMPRESSION NORMAL CERVICAL MYELOGRAM. POST-MYELOGRAM CT - LUMBAR SPINE: CT SCAN OF THE LUMBAR SPINE IS PERFORMED FROM L2 TO S1 WITH CONTIGUOUS AXIAL SECTIONS. L2-3:     NORMAL INTERSPACE. L3-4:     NORMAL INTERSPACE. L4-5:     NORMAL INTERSPACE. L5-S1:   NORMAL INTERSPACE. INCIDENTAL NOTE IS MADE  OF INCOMPLETE FUSION OF THE POSTERIOR ELEMENTS AT L5. IMPRESSION NORMAL LUMBAR POST-MYELOGRAM CT. POST-MYELOGRAM CT - CERVICAL SPINE: CONTIGUOUS AXIAL SECTIONS EXTEND INTO THE CERVICAL SPINE. C2-3:    NORMAL INTERSPACE. C3-4:    NORMAL INTERSPACE. C4-5:    NORMAL INTERSPACE. C5-6:    NORMAL INTERSPACE. C6-7:    POST-SURGICAL CHANGE IS DEMONSTRATED.   THE BONE PLUG AND HARDWARE APPEAR WELL POSITIONED.  THERE IS NO FORAMINAL STENOSIS.  THERE IS NO MASS EFFECT UPON THE SUBARACHNOID SPACE. C7-T1: NORMAL INTERSPACE. IMPRESSION FUSION IN CERVICAL SPINE HAS A NORMAL APPEARANCE.   THE EXAMINATION IS OTHERWISE UNREMARKABLE.   Lumbosacral Imaging: Lumbar MR wo contrast:  Results for orders placed during the hospital encounter of 09/18/15  MR Lumbar Spine Wo Contrast   Narrative CLINICAL DATA:  Lumbar radiculopathy. Low back pain radiating down the left leg to the ankle for 2 months.  EXAM: MRI LUMBAR SPINE WITHOUT CONTRAST  TECHNIQUE: Multiplanar, multisequence MR imaging of the lumbar spine was performed. No intravenous contrast was administered.  COMPARISON:  04/14/2014  FINDINGS: Vertebral alignment is normal. Vertebral body heights are preserved. There is diffuse lumbar disc desiccation. Slight disc space narrowing is present at L3-4. Minimal type 1 degenerative  endplate changes are noted at L3-4 and L5-S1. The conus medullaris is normal in signal and terminates at L1. Paraspinal soft tissues are unremarkable.  T12-L1 and L1-2:  Negative.  L2-3:  Minimal disc bulging without stenosis, unchanged.  L3-4: Mild disc bulging and slight facet hypertrophy without stenosis, unchanged.  L4-5: Minimal disc bulging and mild facet hypertrophy without stenosis, unchanged.  L5-S1: Minimal disc bulging and mild facet hypertrophy without stenosis, unchanged.  IMPRESSION: Mild lumbar disc and facet degeneration without stenosis or significant interval change.   Electronically Signed   By: Logan Bores M.D.   On: 09/18/2015 17:09    Lumbar MR wo contrast:  Results for orders placed in visit on 04/14/14  [place] W/O Cm   Narrative * PRIOR REPORT IMPORTED FROM AN EXTERNAL SYSTEM *   CLINICAL DATA:  65 year old female with low back pain radiating to  the left hip and lower extremity with burning sensation.  Superimposed right hip pain. Initial encounter.   EXAM:  MRI LUMBAR SPINE WITHOUT CONTRAST   TECHNIQUE:  Multiplanar, multisequence MR imaging of the lumbar spine was  performed. No intravenous contrast was administered.   COMPARISON:  Lumbar MRI 04/13/2013, and 01/27/2010.   FINDINGS:  Lumbar segmentation appears to be normal and will be designated as  such for this report. Normal vertebral height and alignment. No  marrow edema or evidence of acute osseous abnormality.   Visualized lower thoracic spinal cord is normal with conus medularis  at L1. Visualized abdominal viscera and paraspinal soft tissues are  within normal limits.   T10-T11: Partially visible, grossly negative.   T11-T12:  Negative.   T12-L1:  Negative.   L1-L2:  Negative.   L2-L3:  Minor disc desiccation and disc bulge.  Otherwise negative.   L3-L4: Mild disc desiccation and circumferential disc bulge. Minimal  to mild facet hypertrophy. No stenosis,  and this level is stable.   L4-L5:  Stable mild facet hypertrophy.  No stenosis.   L5-S1: Stable minor disc desiccation. No focal disc herniation.  Stable mild to moderate facet hypertrophy. No stenosis.   IMPRESSION:  Stable mild for age lumbar spine degeneration. No spinal stenosis or  convincing neural impingement.    Electronically Signed    By: Lars Pinks  M.D.    On: 04/14/2014 09:43       Lumbar CT wo contrast:  Results for orders placed in visit on 11/01/98  CT Lumbar Spine Wo Contrast   Narrative FINDINGS CLINICAL DATA:   THE PATIENT HAS RIGHT SHOULDER PAIN, RIGHT HIP, AND LEFT ARM PAIN. MYELOGRAMS: A LUMBAR SPINAL INJECTION WAS PERFORMED BY DR. Vertell Limber WITH [facility].   LUMBAR MYELOGRAM WAS INITIALLY PERFORMED FOLLOWED BY A CERVICAL MYELOGRAM. LUMBAR: LUMBAR MYELOGRAM SHOWS FIVE LUMBAR TYPE VERTEBRAE.  ALIGNMENT IS ANATOMIC.   THE NERVE ROOTS SHOW NORMAL FILLING.  THERE IS NO EXTRADURAL DEFECTS. IMPRESSION NORMAL LUMBAR MYELOGRAM. CERVICAL: CONTRAST WAS SUBSEQUENTLY DUMPED INTO THE CERVICAL REGION.  WE EVALUATED THE THORACIC SPINE DURING THIS PROCEDURE.   WITHIN THE THORACIC SPINE, THE THECAL SAC HAS A NORMAL APPEARANCE.  THERE IS NO EXTRADURAL DEFECTS IDENTIFIED. CERVICAL MYELOGRAM IMAGES WERE SUBSEQUENTLY OBTAINED.   POST-SURGICAL CHANGES SECONDARY TO INTERBODY FUSION AT C6-7 ARE DEMONSTRATED.  THE PATIENT WAS FUSED WITH ANTERIOR SCREW-PLATE FIXATION AND INTERBODY PLUG.   THERE IS NO EXTRADURAL DEFECT AT THE LEVEL OF THE FUSION.  THE CERVICAL MYELOGRAM HAS A NORMAL APPEARANCE WITHOUT NERVE ROOT CUT-OFF. IMPRESSION NORMAL CERVICAL MYELOGRAM. POST-MYELOGRAM CT - LUMBAR SPINE: CT SCAN OF THE LUMBAR SPINE IS PERFORMED FROM L2 TO S1 WITH CONTIGUOUS AXIAL SECTIONS. L2-3:     NORMAL INTERSPACE. L3-4:     NORMAL INTERSPACE. L4-5:     NORMAL INTERSPACE. L5-S1:   NORMAL INTERSPACE. INCIDENTAL NOTE IS MADE OF INCOMPLETE FUSION OF THE POSTERIOR ELEMENTS AT  L5. IMPRESSION NORMAL LUMBAR POST-MYELOGRAM CT. POST-MYELOGRAM CT - CERVICAL SPINE: CONTIGUOUS AXIAL SECTIONS EXTEND INTO THE CERVICAL SPINE. C2-3:    NORMAL INTERSPACE. C3-4:    NORMAL INTERSPACE. C4-5:    NORMAL INTERSPACE. C5-6:    NORMAL INTERSPACE. C6-7:    POST-SURGICAL CHANGE IS DEMONSTRATED.   THE BONE PLUG AND HARDWARE APPEAR WELL POSITIONED.  THERE IS NO FORAMINAL STENOSIS.  THERE IS NO MASS EFFECT UPON THE SUBARACHNOID SPACE. C7-T1: NORMAL INTERSPACE. IMPRESSION FUSION IN CERVICAL SPINE HAS A NORMAL APPEARANCE.   THE EXAMINATION IS OTHERWISE UNREMARKABLE.   Lumbar CT w contrast:  Results for orders placed during the hospital encounter of 04/16/11  CT Lumbar Spine W Contrast   Narrative *RADIOLOGY REPORT*  Clinical Data:  Back pain  CT MYELOGRAPHY LUMBAR SPINE  Technique:  CT imaging of the lumbar spine was performed after intrathecal contrast administration.  Multiplanar CT image reconstructions were also generated.  Comparison:  Lumbar MRI 01/27/2010  Findings:  Normal lumbar alignment.  Negative for fracture or mass. Conus medullaris is normal and terminates at L1-2.  Atherosclerotic calcification in the aorta without aneurysm.  L1-2:  Negative  L2-3:  Negative  L3-4:  Mild disc bulging without stenosis or disc protrusion.  L4-5:  Mild disc degeneration and disc bulging.  Mild facet degeneration.  Mild narrowing of the canal without significant spinal stenosis.  L5-S1:  Mild disc degeneration and mild facet degeneration without spinal stenosis or neural impingement.  IMPRESSION: Mild lumbar degenerative changes.  Negative for disc protrusion or spinal stenosis.  Original Report Authenticated By: Truett Perna, M.D.   Lumbar DG (Complete) 4+V:  Results for orders placed in visit on 05/26/98  DG Lumbar Spine Complete   Narrative FINDINGS CLINICAL DATA:  RIGHT LOW BACK PAIN.  PAIN IN BUTTOCKS, RIGHT HIP AND RIGHT LOWER EXTREMITY.  LEFT- SIDED NECK  PAIN.  PAIN IN LEFT SHOULDER AND LEFT UPPER EXTREMITY. PLAIN FILM SERIES OF THE CERVICAL SPINE:  OPEN MOUTH ODONTOID, AP, LATERAL AND OBLIQUE VIEWS WERE OBTAINED. THERE IS MILD VERTEBRAL BODY OSTEOPHYTIC FORMATION AT C4-5, C5-6 AND C6-7.  THERE IS NO SIGNIFICANT DISC SPACE NARROWING. THERE IS NO SUBLUXATION. THE BONY NEURAL FORAMINA APPEAR WIDELY PATENT. SAGITTAL DIMENSION OF THE CENTRAL CANAL ON THE LATERAL VIEW APPEARS ADEQUATE. MRI OF THE CERVICAL SPINE: MULTIPLANAR AND MULTISEQUENCE IMAGES WERE ACQUIRED ACCORDING TO THE STANDARD PROTOCOL. THE CRANIOVERTEBRAL JUNCTION APPEARS NORMAL. THE SUPERIOR CERVICAL CORD HAS A NORMAL CONTOUR AND SIGNAL INTENSITY. THERE IS MILD POSTERIOR ANNULAR BULGING AT C4-5 AND AT C5-6. THERE IS POSTERIOR ANNULAR BULGING AT C6-7, CENTRALLY AND TO THE RIGHT AND A DISC HERNIATION PARACENTRALLY ON THE LEFT AT C6-7. THIS EXTENDS TOWARDS THE NEURAL FORAMEN BUT DOES NOT APPEAR TO EXTEND FRANKLY INTO THE FORAMEN. THERE IS MILD BONY FORAMINAL STENOSIS. THE C7-T1 LEVEL APPEARS UNREMARKABLE. IMPRESSION   Lumbar DG Myelogram views:  Results for orders placed in visit on 11/01/98  DG Myelogram Lumbar   Narrative FINDINGS CLINICAL DATA:   THE PATIENT HAS RIGHT SHOULDER PAIN, RIGHT HIP, AND LEFT ARM PAIN. MYELOGRAMS: A LUMBAR SPINAL INJECTION WAS PERFORMED BY DR. Vertell Limber WITH [facility].   LUMBAR MYELOGRAM WAS INITIALLY PERFORMED FOLLOWED BY A CERVICAL MYELOGRAM. LUMBAR: LUMBAR MYELOGRAM SHOWS FIVE LUMBAR TYPE VERTEBRAE.  ALIGNMENT IS ANATOMIC.   THE NERVE ROOTS SHOW NORMAL FILLING.  THERE IS NO EXTRADURAL DEFECTS. IMPRESSION NORMAL LUMBAR MYELOGRAM. CERVICAL: CONTRAST WAS SUBSEQUENTLY DUMPED INTO THE CERVICAL REGION.  WE EVALUATED THE THORACIC SPINE DURING THIS PROCEDURE.   WITHIN THE THORACIC SPINE, THE THECAL SAC HAS A NORMAL APPEARANCE.  THERE IS NO EXTRADURAL DEFECTS IDENTIFIED. CERVICAL MYELOGRAM IMAGES WERE SUBSEQUENTLY OBTAINED.   POST-SURGICAL CHANGES  SECONDARY TO INTERBODY FUSION AT C6-7 ARE DEMONSTRATED.  THE PATIENT WAS FUSED WITH ANTERIOR SCREW-PLATE FIXATION AND INTERBODY PLUG.   THERE IS NO EXTRADURAL DEFECT AT THE LEVEL OF THE FUSION.  THE CERVICAL MYELOGRAM HAS A NORMAL APPEARANCE WITHOUT NERVE ROOT CUT-OFF. IMPRESSION NORMAL CERVICAL MYELOGRAM. POST-MYELOGRAM CT - LUMBAR SPINE: CT SCAN OF THE LUMBAR SPINE IS PERFORMED FROM L2 TO S1 WITH CONTIGUOUS AXIAL SECTIONS. L2-3:     NORMAL INTERSPACE. L3-4:     NORMAL INTERSPACE. L4-5:     NORMAL INTERSPACE. L5-S1:   NORMAL INTERSPACE. INCIDENTAL NOTE IS MADE OF INCOMPLETE FUSION OF THE POSTERIOR ELEMENTS AT L5. IMPRESSION NORMAL LUMBAR POST-MYELOGRAM CT. POST-MYELOGRAM CT - CERVICAL SPINE: CONTIGUOUS AXIAL SECTIONS EXTEND INTO THE CERVICAL SPINE. C2-3:    NORMAL INTERSPACE. C3-4:    NORMAL INTERSPACE. C4-5:    NORMAL INTERSPACE. C5-6:    NORMAL INTERSPACE. C6-7:    POST-SURGICAL CHANGE IS DEMONSTRATED.   THE BONE PLUG AND HARDWARE APPEAR WELL POSITIONED.  THERE IS NO FORAMINAL STENOSIS.  THERE IS NO MASS EFFECT UPON THE SUBARACHNOID SPACE. C7-T1: NORMAL INTERSPACE. IMPRESSION FUSION IN CERVICAL SPINE HAS A NORMAL APPEARANCE.   THE EXAMINATION IS OTHERWISE UNREMARKABLE.   Hip Imaging: Hip-R CT wo contrast:  Results for orders placed in visit on 12/30/13  CT Hip Right Wo Contrast   Narrative * PRIOR REPORT IMPORTED FROM AN EXTERNAL SYSTEM *   CLINICAL DATA:  Right hip pain worsening for 4 years. Pelvic  radiograph obtained on 12/10/2013 showed a sclerotic lesion in the  right acetabulum less well seen on the prior radiographs from  04/17/2010   EXAM:  CT OF THE RIGHT HIP WITHOUT CONTRAST   TECHNIQUE:  Multidetector CT imaging was performed according to the standard  protocol. Multiplanar CT image reconstructions were also generated.  COMPARISON:  12/10/2013; 04/17/2010   FINDINGS:  CT confirms a 1.2 x 0.9 cm sclerotic lesion in the anterior wall of  the right  acetabulum, image 48 series 2, with spiculated margins and  sharp definition. My review of prior images suggest that this was  present in 2011 but has increased in size.   4 mm sclerotic lesion in the right femoral head, image 60 of series  2.   No hip effusion. Mild atherosclerosis in a right external iliac  artery. No obvious abnormality along the hamstring or hip adductor  musculature. No regional bursitis identified. Faint  chondrocalcinosis in the pubic symphysis.   Mild acetabular spurring.   IMPRESSION:  1. The sclerotic lesion in the anterior wall of the right acetabulum  has spiculated margins in imaging characteristics typical for a bone  island. There is probably a also small bone island in the right  femoral head. This bone island has enlarged compared to 2011,  although slow enlargement is not on a common with bone islands. The  spiculated margins make this unlikely to be a metastatic lesion,  although if the patient has a history of breast cancer or other head  cancer with predilection for bony spread then it might be prudent to  get a whole-body bone scan.  2. Mild atherosclerosis.  3. Faint chondrocalcinosis in the pubic symphysis, query CPPD  arthropathy.    Electronically Signed    By: Sherryl Barters M.D.    On: 12/30/2013 15:21       Note: Imaging reviewed.  Meds  The patient has a current medication list which includes the following prescription(s): cholecalciferol, duloxetine, enalapril, esomeprazole, estradiol, fenofibrate, gabapentin, lorazepam, meloxicam, methocarbamol, naloxone hcl, oxycodone-acetaminophen, oxycodone-acetaminophen, oxycodone-acetaminophen, and rosuvastatin.  Current Outpatient Prescriptions on File Prior to Visit  Medication Sig  . cholecalciferol (VITAMIN D) 400 UNITS TABS tablet Take 1,000 Units by mouth daily.  . DULoxetine (CYMBALTA) 60 MG capsule Take 30 mg by mouth daily.   . enalapril (VASOTEC) 5 MG tablet Take 5 mg by  mouth daily.  Marland Kitchen esomeprazole (NEXIUM) 20 MG capsule Take 20 mg by mouth daily at 12 noon.  Marland Kitchen estradiol (ESTRACE) 0.5 MG tablet Take 0.5 mg by mouth daily.   . fenofibrate 54 MG tablet Take 54 mg by mouth daily. Reported on 07/06/2015  . Naloxone HCl (NARCAN) 4 MG/0.1ML LIQD Place 1 Bottle into the nose once. , then call 911, repeat if needed in other nostril with new bottle.   No current facility-administered medications on file prior to visit.    ROS  Constitutional: Denies any fever or chills Gastrointestinal: No reported hemesis, hematochezia, vomiting, or acute GI distress Musculoskeletal: Denies any acute onset joint swelling, redness, loss of ROM, or weakness Neurological: No reported episodes of acute onset apraxia, aphasia, dysarthria, agnosia, amnesia, paralysis, loss of coordination, or loss of consciousness  Allergies  Jacqueline Thornton has No Known Allergies.  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 Family: family history includes Diabetes in her father and mother; Hypertension in her sister; Stroke in her mother. Surgical:  has past surgical history that includes Cholecystectomy; Abdominal hysterectomy; Neck surgery (Bilateral);  Appendectomy; Neck surgery; Shoulder surgery; and Colonoscopy with propofol (N/A, 07/19/2015). Tobacco:  reports that she has quit smoking. She does not have any smokeless tobacco history on file. Alcohol:  reports that she does not drink alcohol. Drug:  reports that she  does not use illicit drugs.  Constitutional Exam  Vitals: Blood pressure 116/66, pulse 88, temperature 98.3 F (36.8 C), resp. rate 16, height 5' (1.524 m), weight 142 lb (64.411 kg), SpO2 97 %. General appearance: Well nourished, well developed, and well hydrated. In no acute distress Calculated BMI/Body habitus: Body mass index is 27.73 kg/(m^2). (25-29.9 kg/m2) Overweight - 20% higher incidence of chronic pain Psych/Mental status: Alert and oriented x 3 (person, place, & time) Eyes: PERLA Respiratory: No evidence of acute respiratory distress  Cervical Spine Exam  Inspection: No masses, redness, or swelling Alignment: Symmetrical ROM: Functional: ROM is within functional limits [facility]) Stability: No instability detected Muscle strength & Tone: Functionally intact Sensory: Unimpaired Palpation: No complaints of tenderness  Upper Extremity (UE) Exam    Side: Right upper extremity  Side: Left upper extremity  Inspection: No masses, redness, swelling, or asymmetry  Inspection: No masses, redness, swelling, or asymmetry  ROM:  ROM:  Functional: ROM is within functional limits [facility])  Functional: ROM is within functional limits [facility])  Muscle strength & Tone: Functionally intact  Muscle strength & Tone: Functionally intact  Sensory: Unimpaired  Sensory: Unimpaired  Palpation: Non-contributory  Palpation: Non-contributory   Thoracic Spine Exam  Inspection: No masses, redness, or swelling Alignment: Symmetrical ROM: Functional: ROM is within functional limits [facility]) Stability: No instability detected Sensory: Unimpaired Muscle strength & Tone: Functionally intact Palpation: No complaints of tenderness  Lumbar Spine Exam  Inspection: No masses, redness, or swelling Alignment: Symmetrical ROM: Functional: ROM is within functional limits [facility]) Stability: No instability detected Muscle strength & Tone: Functionally intact Sensory: Unimpaired Palpation: No complaints of  tenderness Provocative Tests: Lumbar Hyperextension and rotation test: deferred Patrick's Maneuver: deferred  Gait & Posture Assessment  Ambulation: Unassisted Gait: Unaffected Posture: WNL  Lower Extremity Exam    Side: Right lower extremity  Side: Left lower extremity  Inspection: No masses, redness, swelling, or asymmetry ROM:  Inspection: No masses, redness, swelling, or asymmetry ROM:  Functional: ROM is within functional limits [facility])  Functional: ROM is within functional limits [facility])  Muscle strength & Tone: Functionally intact  Muscle strength & Tone: Functionally intact  Sensory: Unimpaired  Sensory: Unimpaired  Palpation: Non-contributory  Palpation: Non-contributory   Assessment & Plan  Primary Diagnosis & Pertinent Problem List: The primary encounter diagnosis was Chronic pain. Diagnoses of Encounter for therapeutic drug level monitoring, Long term current use of opiate analgesic, Opiate use (56.25 MME/Day), Idiopathic gout, unspecified chronicity, unspecified site, Lumbar facet syndrome (Bilateral) (R>L), Lumbar spondylosis, unspecified spinal osteoarthritis, Chronic low back pain (Location of Secondary source of pain) (Bilateral) (midline to tailbone) (R>L), Myofascial pain, Neurogenic pain, Chronic sacroiliac joint pain, Cervical spondylosis, Cervical spinal fusion (C6-7 interbody fusion), Chronic pain of lower extremity, unspecified laterality, Chronic lumbar radicular pain (Location of Primary Source of Pain) (Left) (S1 Dermatome), Chronic neck pain, 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  Visit Diagnosis: 1. Chronic pain   2. Encounter for therapeutic drug level monitoring   3. Long term current use of opiate analgesic   4. Opiate use (56.25 MME/Day)   5. Idiopathic gout, unspecified chronicity, unspecified site   6. Lumbar facet syndrome (Bilateral) (  R>L)   7. Lumbar spondylosis, unspecified  spinal osteoarthritis   8. Chronic low back pain (Location of Secondary source of pain) (Bilateral) (midline to tailbone) (R>L)   9. Myofascial pain   10. Neurogenic pain   11. Chronic sacroiliac joint pain   12. Cervical spondylosis   13. Cervical spinal fusion (C6-7 interbody fusion)   14. Chronic pain of lower extremity, unspecified laterality   15. Chronic lumbar radicular pain (Location of Primary Source of Pain) (Left) (S1 Dermatome)   16. Chronic neck pain   17. 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 Trochanteric bursitis of right hip     Problems updated and reviewed during this visit: No problems updated.  Problem-specific Plan(s): No problem-specific assessment & plan notes found for this encounter.  No new assessment & plan notes have been filed under this hospital service since the last note was generated. Service: Pain Management   Plan of Care   Problem List Items Addressed This Visit      High   Cervical spinal fusion (C6-7 interbody fusion)   Relevant Orders   CERVICAL EPIDURAL STEROID INJECTION   Cervical spondylosis (Chronic)   Relevant Medications   meloxicam (MOBIC) 15 MG tablet   methocarbamol (ROBAXIN) 750 MG tablet   oxyCODONE-acetaminophen (PERCOCET) 7.5-325 MG tablet   oxyCODONE-acetaminophen (PERCOCET) 7.5-325 MG tablet   oxyCODONE-acetaminophen (PERCOCET) 7.5-325 MG tablet   Other Relevant Orders   CERVICAL EPIDURAL STEROID INJECTION   CERVICAL FACET (MEDIAL BRANCH NERVE BLOCK)    Chronic low back pain (Location of Secondary source of pain) (Bilateral) (midline to tailbone) (R>L) (Chronic)   Relevant Medications   meloxicam (MOBIC) 15 MG tablet   methocarbamol (ROBAXIN) 750 MG tablet   oxyCODONE-acetaminophen (PERCOCET) 7.5-325 MG tablet   oxyCODONE-acetaminophen (PERCOCET) 7.5-325 MG tablet   oxyCODONE-acetaminophen (PERCOCET) 7.5-325 MG tablet   Other Relevant Orders   Radiofrequency,Lumbar   LUMBAR FACET(MEDIAL BRANCH NERVE  BLOCK) MBNB   Chronic lower extremity pain (Location of Primary Source of Pain) (Bilateral) (L>R) (Chronic)   Relevant Orders   LUMBAR EPIDURAL STEROID INJECTION   LUMBAR EPIDURAL STEROID INJECTION   SELECTIVE NERVE ROOT   Chronic lumbar radicular pain (Location of Primary Source of Pain) (Left) (S1 Dermatome) (Chronic)   Relevant Orders   LUMBAR EPIDURAL STEROID INJECTION   LUMBAR EPIDURAL STEROID INJECTION   SELECTIVE NERVE ROOT   Chronic neck pain (Chronic)   Relevant Medications   meloxicam (MOBIC) 15 MG tablet   methocarbamol (ROBAXIN) 750 MG tablet   gabapentin (NEURONTIN) 300 MG capsule   oxyCODONE-acetaminophen (PERCOCET) 7.5-325 MG tablet   oxyCODONE-acetaminophen (PERCOCET) 7.5-325 MG tablet   oxyCODONE-acetaminophen (PERCOCET) 7.5-325 MG tablet   Other Relevant Orders   CERVICAL FACET (MEDIAL BRANCH NERVE BLOCK)    Chronic pain - Primary (Chronic)   Relevant Medications   meloxicam (MOBIC) 15 MG tablet   methocarbamol (ROBAXIN) 750 MG tablet   gabapentin (NEURONTIN) 300 MG capsule   oxyCODONE-acetaminophen (PERCOCET) 7.5-325 MG tablet   oxyCODONE-acetaminophen (PERCOCET) 7.5-325 MG tablet   oxyCODONE-acetaminophen (PERCOCET) 7.5-325 MG tablet   Other Relevant Orders   25-Hydroxyvitamin D Lcms D2+D3 (Completed)   Vitamin B12 (Completed)   Chronic sacroiliac joint pain (B) (L>R) (Chronic)   Relevant Medications   meloxicam (MOBIC) 15 MG tablet   methocarbamol (ROBAXIN) 750 MG tablet   oxyCODONE-acetaminophen (PERCOCET) 7.5-325 MG tablet   oxyCODONE-acetaminophen (PERCOCET) 7.5-325 MG tablet   oxyCODONE-acetaminophen (PERCOCET) 7.5-325 MG tablet   Other Relevant Orders   SACROILIAC JOINT  INJECTINS   SACROILIAC JOINT INJECTINS   Discogenic low back pain (L3-4 and L4-5) (Chronic)   Relevant Medications   meloxicam (MOBIC) 15 MG tablet   methocarbamol (ROBAXIN) 750 MG tablet   oxyCODONE-acetaminophen (PERCOCET) 7.5-325 MG tablet   oxyCODONE-acetaminophen (PERCOCET)  7.5-325 MG tablet   oxyCODONE-acetaminophen (PERCOCET) 7.5-325 MG tablet   Other Relevant Orders   LUMBAR FACET(MEDIAL BRANCH NERVE BLOCK) MBNB   Lumbar facet syndrome (Bilateral) (R>L) (Chronic)   Relevant Medications   meloxicam (MOBIC) 15 MG tablet   methocarbamol (ROBAXIN) 750 MG tablet   oxyCODONE-acetaminophen (PERCOCET) 7.5-325 MG tablet   oxyCODONE-acetaminophen (PERCOCET) 7.5-325 MG tablet   oxyCODONE-acetaminophen (PERCOCET) 7.5-325 MG tablet   Other Relevant Orders   Radiofrequency,Lumbar   LUMBAR FACET(MEDIAL BRANCH NERVE BLOCK) MBNB   Lumbar spondylosis (Chronic)   Relevant Medications   meloxicam (MOBIC) 15 MG tablet   methocarbamol (ROBAXIN) 750 MG tablet   oxyCODONE-acetaminophen (PERCOCET) 7.5-325 MG tablet   oxyCODONE-acetaminophen (PERCOCET) 7.5-325 MG tablet   oxyCODONE-acetaminophen (PERCOCET) 7.5-325 MG tablet   Other Relevant Orders   Radiofrequency,Lumbar   LUMBAR EPIDURAL STEROID INJECTION   LUMBAR EPIDURAL STEROID INJECTION   Myofascial pain (Chronic)   Relevant Medications   methocarbamol (ROBAXIN) 750 MG tablet   Neurogenic pain (Chronic)   Relevant Medications   gabapentin (NEURONTIN) 300 MG capsule   Trochanteric bursitis of right hip (Chronic)   Relevant Orders   HIP INJECTION     Medium   Encounter for therapeutic drug level monitoring   Long term current use of opiate analgesic (Chronic)   Relevant Orders   ToxASSURE Select 13 (MW), Urine   Opiate use (56.25 MME/Day) (Chronic)     Low   Gout       Pharmacotherapy (Medications Ordered): Meds ordered this encounter  Medications  . meloxicam (MOBIC) 15 MG tablet    Sig: Take 1 tablet (15 mg total) by mouth daily.    Dispense:  30 tablet    Refill:  2    Do not add this medication to the electronic "Automatic Refill" notification system. Patient may have prescription filled one day early if pharmacy is closed on scheduled refill date.  . methocarbamol (ROBAXIN) 750 MG tablet    Sig:  Take 1 tablet (750 mg total) by mouth 4 (four) times daily.    Dispense:  120 tablet    Refill:  2    Do not place this medication, or any other prescription from our practice, on "Automatic Refill". Patient may have prescription filled one day early if pharmacy is closed on scheduled refill date.  . gabapentin (NEURONTIN) 300 MG capsule    Sig: Take 1 capsule (300 mg total) by mouth 2 (two) times daily.    Dispense:  60 capsule    Refill:  2    Do not add this medication to the electronic "Automatic Refill" notification system. Patient may have prescription filled one day early if pharmacy is closed on scheduled refill date.  Marland Kitchen oxyCODONE-acetaminophen (PERCOCET) 7.5-325 MG tablet    Sig: Take 1 tablet by mouth 5 (five) times daily as needed for severe pain.    Dispense:  150 tablet    Refill:  0    Do not place this medication, or any other prescription from our practice, on "Automatic Refill". Patient may have prescription filled one day early if pharmacy is closed on scheduled refill date. Do not fill until: 01/01/16 To last until: 01/31/16  . oxyCODONE-acetaminophen (PERCOCET) 7.5-325 MG tablet  Sig: Take 1 tablet by mouth 5 (five) times daily as needed for severe pain.    Dispense:  150 tablet    Refill:  0    Do not place this medication, or any other prescription from our practice, on "Automatic Refill". Patient may have prescription filled one day early if pharmacy is closed on scheduled refill date. Do not fill until: 01/31/16 To last until: 03/01/16  . oxyCODONE-acetaminophen (PERCOCET) 7.5-325 MG tablet    Sig: Take 1 tablet by mouth 5 (five) times daily as needed for severe pain.    Dispense:  150 tablet    Refill:  0    Do not place this medication, or any other prescription from our practice, on "Automatic Refill". Patient may have prescription filled one day early if pharmacy is closed on scheduled refill date. Do not fill until: 03/01/16 To last until: 03/31/16     [facility] & Procedure Ordered: Orders Placed This Encounter  Procedures  . Radiofrequency,Lumbar  . SACROILIAC JOINT INJECTINS  . CERVICAL EPIDURAL STEROID INJECTION  . CERVICAL FACET (MEDIAL BRANCH NERVE BLOCK)   . LUMBAR FACET(MEDIAL BRANCH NERVE BLOCK) MBNB  . LUMBAR EPIDURAL STEROID INJECTION  . LUMBAR EPIDURAL STEROID INJECTION  . SELECTIVE NERVE ROOT  . SACROILIAC JOINT INJECTINS  . HIP INJECTION  . ToxASSURE Select 13 (MW), Urine  . 25-Hydroxyvitamin D Lcms D2+D3  . Vitamin B12    Imaging Ordered: None  Interventional Therapies: Scheduled:  Right sided lumbar facet radiofrequency ablation + Left S-I Block,  under fluoroscopic guidance and IV sedation.    Considering:   1. Diagnostic cervical epidural steroid injection under fluoroscopic guidance, with a without sedation.  2. Diagnostic bilateral cervical facet block under fluoroscopic guidance and IV sedation.  3. Possible bilateral cervical facet radiofrequency ablation under fluoroscopic guidance and IV sedation.  4. Diagnostic bilateral lumbar facet block under fluoroscopic guidance and IV sedation.  5. Possible bilateral lumbar facet radiofrequency ablation under fluoroscopic guidance and IV sedation.  6. Diagnostic left L4-5 lumbar epidural steroid injection under fluoroscopic guidance, with a without IV sedation.  7. Diagnostic left caudal epidural steroid injection under fluoroscopic guidance, with a without sedation.  8. Diagnostic left S1 selective nerve root block under fluoroscopic guidance, with a without sedation.  9. Diagnostic bilateral sacroiliac joint block under fluoroscopic guidance, with a without sedation.  10. Possible bilateral sacroiliac joint radiofrequency ablation under fluoroscopic guidance and IV sedation.  11. Diagnostic right trochanteric bursa injection under fluoroscopic guidance, no sedation.    PRN Procedures:   1. Diagnostic cervical epidural steroid injection under fluoroscopic  guidance, with a without sedation.  2. Diagnostic bilateral cervical facet block under fluoroscopic guidance and IV sedation.  3. Diagnostic bilateral lumbar facet block under fluoroscopic guidance and IV sedation.  4. Diagnostic left L4-5 lumbar epidural steroid injection under fluoroscopic guidance, with a without IV sedation.  5. Diagnostic left caudal epidural steroid injection under fluoroscopic guidance, with a without sedation.  6. Diagnostic left S1 selective nerve root block under fluoroscopic guidance, with a without sedation.  7. Diagnostic bilateral sacroiliac joint block under fluoroscopic guidance, with a without sedation.  8. Diagnostic right trochanteric bursa injection under fluoroscopic guidance, no sedation.    Referral(s) or Consult(s): None at this time.  New Prescriptions   No medications on file    Medications administered during this visit: Jacqueline Thornton had no medications administered during this visit.  Requested PM Follow-up: Return in about 3 months (around 03/18/2016) for Procedure (Scheduled),  Medication Management, (3-Mo).  Future Appointments Date Time Provider [place]  01/23/2016 1:00 PM Milinda Pointer, MD [facility] None  03/18/2016 1:40 PM Milinda Pointer, MD [facility] None    Primary Care Physician: Lavera Guise, MD Location: [facility] Facility Note by: Kathlen Brunswick Dossie Arbour, M.D, DABA, DABAPM, DABPM, DABIPP, FIPP  Pain Score Disclaimer: We use the NRS-11 scale. This is a self-reported, subjective measurement of pain severity with only modest accuracy. It is used primarily to identify changes within a particular patient. It must be understood that outpatient pain scales are significantly less accurate that those used for research, where they can be applied under ideal controlled circumstances with minimal exposure to variables. In reality, the score is likely to be a combination of pain intensity and pain affect, where  pain affect describes the degree of emotional arousal or changes in action readiness caused by the sensory experience of pain. Factors such as social and work situation, setting, emotional state, anxiety levels, expectation, and prior pain experience may influence pain perception and show large inter-individual differences that may also be affected by time variables.  Patient instructions provided at this appointment:: Patient Instructions  Please get your labs done today. Sacroiliac (SI) Joint Injection Patient Information  Description: The sacroiliac joint connects the scrum (very low back and tailbone) to the ilium (a pelvic bone which also forms half of the hip joint).  Normally this joint experiences very little motion.  When this joint becomes inflamed or unstable low back and or hip and pelvis pain may result.  Injection of this joint with local anesthetics (numbing medicines) and steroids can provide diagnostic information and reduce pain.  This injection is performed with the aid of x-ray guidance into the tailbone area while you are lying on your stomach.   You may experience an electrical sensation down the leg while this is being done.  You may also experience numbness.  We also may ask if we are reproducing your normal pain during the injection.  Conditions which may be treated SI injection:   Low back, buttock, hip or leg pain  Preparation for the Injection:  1. Do not eat any solid food or dairy products within 8 hours of your appointment.  2. You may drink clear liquids up to 3 hours before appointment.  Clear liquids include water, black coffee, juice or soda.  No milk or cream please. 3. You may take your regular medications, including pain medications with a sip of water before your appointment.  Diabetics should hold regular insulin (if take separately) and take 1/2 normal NPH dose the morning of the procedure.  Carry some sugar containing items with you to your  appointment. 4. A driver must accompany you and be prepared to drive you home after your procedure. 5. Bring all of your current medications with you. 6. An IV may be inserted and sedation may be given at the discretion of the physician. 7. A blood pressure cuff, EKG and other monitors will often be applied during the procedure.  Some patients may need to have extra oxygen administered for a short period.  8. You will be asked to provide medical information, including your allergies, prior to the procedure.  We must know immediately if you are taking blood thinners (like Coumadin/Warfarin) or if you are allergic to IV iodine contrast (dye).  We must know if you could possible be pregnant.  Possible side effects:   Bleeding from needle site  Infection (rare, may require  surgery)  Nerve injury (rare)  Numbness & tingling (temporary)  A brief convulsion or seizure  Light-headedness (temporary)  Pain at injection site (several days)  Decreased blood pressure (temporary)  Weakness in the leg (temporary)   Call if you experience:   New onset weakness or numbness of an extremity below the injection site that last more than 8 hours.  Hives or difficulty breathing ( go to the emergency room)  Inflammation or drainage at the injection site  Any new symptoms which are concerning to you  Please note:  Although the local anesthetic injected can often make your back/ hip/ buttock/ leg feel good for several hours after the injections, the pain will likely return.  It takes 3-7 days for steroids to work in the sacroiliac area.  You may not notice any pain relief for at least that one week.  If effective, we will often do a series of three injections spaced 3-6 weeks apart to maximally decrease your pain.  After the initial series, we generally will wait some months before a repeat injection of the same type.  If you have any questions, please call [phone] [facility]  Radiofrequency Lesioning Radiofrequency lesioning is a procedure that is performed to relieve pain. The procedure is often used for back, neck, or arm pain. Radiofrequency lesioning involves the use of a machine that creates radio waves to make heat. During the procedure, the heat is applied to the nerve that carries the pain signal. The heat damages the nerve and interferes with the pain signal. Pain relief usually lasts for 6 months to 1 year. LET [facility] CARE PROVIDER KNOW ABOUT: 2. Any allergies you have. 3. All medicines you are taking, including vitamins, herbs, eye drops, creams, and over-the-counter medicines. 4. Previous problems you or members of your family have had with the use of anesthetics. 5. Any blood disorders you have. 6. Previous surgeries you have had. 7. Any medical conditions you have. 8. Whether you are pregnant or may be pregnant. RISKS AND COMPLICATIONS Generally, this is a safe procedure. However, problems may occur, including: 9. Pain or soreness at the injection site. 10. Infection at the injection site. 11. Damage to nerves or blood vessels. BEFORE THE PROCEDURE 10. Ask your health care provider about: 1. Changing or stopping your regular medicines. This is especially important if you are taking diabetes medicines or blood thinners. 2. Taking medicines such as aspirin and ibuprofen. These medicines can thin your blood. Do not take these medicines before your procedure if your health care provider instructs you not to. 11. Follow instructions from your health care provider about eating or drinking restrictions. 12. Plan to have someone take you home after the procedure. 13. If you go home right after the procedure, plan to have someone with you for 24 hours. PROCEDURE 5. You will be given one or more of the following: 1. A medicine to help you relax (sedative). 2. A medicine to numb the area (local anesthetic). 6. You will be  awake during the procedure. You will need to be able to talk with the health care provider during the procedure. 7. With the help of a type of X-ray (fluoroscopy), the health care provider will insert a radiofrequency needle into the area to be treated. 8. Next, a wire that carries the radio waves (electrode) will be put through the radiofrequency needle. An electrical pulse will be sent through the electrode to verify the correct nerve. You  will feel a tingling sensation, and you may have muscle twitching. 9. Then, the tissue that is around the needle tip will be heated by an electric current that is passed using the radiofrequency machine. This will numb the nerves. 10. A bandage (dressing) will be put on the insertion area after the procedure is done. The procedure may vary among health care providers and hospitals. AFTER THE PROCEDURE  Your blood pressure, heart rate, breathing rate, and blood oxygen level will be monitored often until the medicines you were given have worn off.  Return to your normal activities as directed by your health care provider.   This information is not intended to replace advice given to you by your health care provider. Make sure you discuss any questions you have with your health care provider.   Document Released: 02/13/2011 Document Revised: 03/08/2015 Document Reviewed: 07/25/2014 Elsevier Interactive Patient Education [employer].

## 2015-12-27 NOTE — Patient Instructions (Signed)
Please get your labs done today. Sacroiliac (SI) Joint Injection Patient Information  Description: The sacroiliac joint connects the scrum (very low back and tailbone) to the ilium (a pelvic bone which also forms half of the hip joint).  Normally this joint experiences very little motion.  When this joint becomes inflamed or unstable low back and or hip and pelvis pain may result.  Injection of this joint with local anesthetics (numbing medicines) and steroids can provide diagnostic information and reduce pain.  This injection is performed with the aid of x-ray guidance into the tailbone area while you are lying on your stomach.   You may experience an electrical sensation down the leg while this is being done.  You may also experience numbness.  We also may ask if we are reproducing your normal pain during the injection.  Conditions which may be treated SI injection:   Low back, buttock, hip or leg pain  Preparation for the Injection:  1. Do not eat any solid food or dairy products within 8 hours of your appointment.  2. You may drink clear liquids up to 3 hours before appointment.  Clear liquids include water, black coffee, juice or soda.  No milk or cream please. 3. You may take your regular medications, including pain medications with a sip of water before your appointment.  Diabetics should hold regular insulin (if take separately) and take 1/2 normal NPH dose the morning of the procedure.  Carry some sugar containing items with you to your appointment. 4. A driver must accompany you and be prepared to drive you home after your procedure. 5. Bring all of your current medications with you. 6. An IV may be inserted and sedation may be given at the discretion of the physician. 7. A blood pressure cuff, EKG and other monitors will often be applied during the procedure.  Some patients may need to have extra oxygen administered for a short period.  8. You will be asked to provide medical  information, including your allergies, prior to the procedure.  We must know immediately if you are taking blood thinners (like Coumadin/Warfarin) or if you are allergic to IV iodine contrast (dye).  We must know if you could possible be pregnant.  Possible side effects:   Bleeding from needle site  Infection (rare, may require surgery)  Nerve injury (rare)  Numbness & tingling (temporary)  A brief convulsion or seizure  Light-headedness (temporary)  Pain at injection site (several days)  Decreased blood pressure (temporary)  Weakness in the leg (temporary)   Call if you experience:   New onset weakness or numbness of an extremity below the injection site that last more than 8 hours.  Hives or difficulty breathing ( go to the emergency room)  Inflammation or drainage at the injection site  Any new symptoms which are concerning to you  Please note:  Although the local anesthetic injected can often make your back/ hip/ buttock/ leg feel good for several hours after the injections, the pain will likely return.  It takes 3-7 days for steroids to work in the sacroiliac area.  You may not notice any pain relief for at least that one week.  If effective, we will often do a series of three injections spaced 3-6 weeks apart to maximally decrease your pain.  After the initial series, we generally will wait some months before a repeat injection of the same type.  If you have any questions, please call [phone] [facility]  Radiofrequency Lesioning Radiofrequency lesioning is a procedure that is performed to relieve pain. The procedure is often used for back, neck, or arm pain. Radiofrequency lesioning involves the use of a machine that creates radio waves to make heat. During the procedure, the heat is applied to the nerve that carries the pain signal. The heat damages the nerve and interferes with the pain signal. Pain relief usually lasts for  6 months to 1 year. LET [facility] CARE PROVIDER KNOW ABOUT: 2. Any allergies you have. 3. All medicines you are taking, including vitamins, herbs, eye drops, creams, and over-the-counter medicines. 4. Previous problems you or members of your family have had with the use of anesthetics. 5. Any blood disorders you have. 6. Previous surgeries you have had. 7. Any medical conditions you have. 8. Whether you are pregnant or may be pregnant. RISKS AND COMPLICATIONS Generally, this is a safe procedure. However, problems may occur, including: 9. Pain or soreness at the injection site. 10. Infection at the injection site. 11. Damage to nerves or blood vessels. BEFORE THE PROCEDURE 10. Ask your health care provider about: 1. Changing or stopping your regular medicines. This is especially important if you are taking diabetes medicines or blood thinners. 2. Taking medicines such as aspirin and ibuprofen. These medicines can thin your blood. Do not take these medicines before your procedure if your health care provider instructs you not to. 11. Follow instructions from your health care provider about eating or drinking restrictions. 12. Plan to have someone take you home after the procedure. 13. If you go home right after the procedure, plan to have someone with you for 24 hours. PROCEDURE 5. You will be given one or more of the following: 1. A medicine to help you relax (sedative). 2. A medicine to numb the area (local anesthetic). 6. You will be awake during the procedure. You will need to be able to talk with the health care provider during the procedure. 7. With the help of a type of X-ray (fluoroscopy), the health care provider will insert a radiofrequency needle into the area to be treated. 8. Next, a wire that carries the radio waves (electrode) will be put through the radiofrequency needle. An electrical pulse will be sent through the electrode to verify the correct nerve. You will feel a  tingling sensation, and you may have muscle twitching. 9. Then, the tissue that is around the needle tip will be heated by an electric current that is passed using the radiofrequency machine. This will numb the nerves. 10. A bandage (dressing) will be put on the insertion area after the procedure is done. The procedure may vary among health care providers and hospitals. AFTER THE PROCEDURE  Your blood pressure, heart rate, breathing rate, and blood oxygen level will be monitored often until the medicines you were given have worn off.  Return to your normal activities as directed by your health care provider.   This information is not intended to replace advice given to you by your health care provider. Make sure you discuss any questions you have with your health care provider.   Document Released: 02/13/2011 Document Revised: 03/08/2015 Document Reviewed: 07/25/2014 Elsevier Interactive Patient Education [employer].

## 2015-12-28 ENCOUNTER — Ambulatory Visit: Payer: Medicare PPO | Admitting: Pain Medicine

## 2015-12-30 LAB — 25-HYDROXY VITAMIN D LCMS D2+D3
25-Hydroxy, Vitamin D-2: <1 ng/mL
25-Hydroxy, Vitamin D-3: 28 ng/mL
25-Hydroxy, Vitamin D: 28 ng/mL — ABNORMAL LOW

## 2016-01-08 NOTE — Progress Notes (Signed)
Quick Note:   Low Vitamin D Results Normal levels: between 30 and 100 ng/mL. Vitamin D Insufficiency: Levels between 20-30 ng/ml are defined as a "Vitamin D insufficiency". Vitamin D Deficiency: Levels below 20 ng/ml, is diagnosed as a "Vitamin D Deficiency".  Common causes include: dietary insufficiency; inadequate sun exposure; inability to absorb vitamin D from the intestines; or inability to process it due to kidney or liver disease. Low 25-hydroxyvitamin D: A low blood level of 25-hydroxyvitamin D may mean that a person is not getting enough exposure to sunlight or enough dietary vitamin D to meet his or her body's demand or that there is a problem with its absorption from the intestines. Occasionally, drugs used to treat seizures, particularly phenytoin (Dilantin), can interfere with the production of 25-hydroxyvitamin D in the liver. There is some evidence that vitamin D deficiency may increase the risk of some cancers, immune diseases, and cardiovascular disease. Low 1,25-dihydroxyvitamin D: A low level of 1,25-dihydroxyvitamin D can be seen in kidney disease and is one of the earliest changes to occur in persons with early kidney failure. Associated complications may include: hypocalcemia, hypophosphatemia, and reduced bone density. Associated symptoms: Vitamin D deficiencies and insufficiencies may be associated with fatigue, weakness, bone pain, joint pain, and muscle pain. Recommendations: Patient may benefit from taking over-the-counter Vitamin D3 supplements. I recommend a vitamin D + Calcium supplements. "Natures Bounty", a brand easily found in most pharmacies, has a formulation containing Calcium 1200 mg plus Vitamin D3 1000 IU, in Softgels capsules that are easy to swallow. This should be taken once a day, preferably in the morning as vitamin D will increase energy levels and make it difficult to fall asleep, if taken at night. Patients with levels lower than 20 ng/ml should contact  their primary care physicians to receive replacement therapy. Vitamin D3 can be obtained over-the-counter, without a prescription. Vitamin D2 requires a prescription and it is used for replacement therapy.     Normal Vitamin B-12 levels are between 180 and 914 pg/mL, for our Lab. Patients with levels below 180 pg/ml are considered to have a Vitamin B-12 "deficiency". Older patients with levels between 200 and 500 may be symptomatic, in which case it is considered an "insufficiency". Symptoms of deficiency or insufficiency may include: tingling and numbness of the digits (fingers & toes), generalized muscle weakness, staggering, irritability, confusion, forgetfulness, tenderness, fatigue, shortness of breath, palpitation, anemia, sporadic episodes of diarrhea, decreased immune system, cognitive impairment, and degeneration of the posterior sensory columns of the spinal cord. Deficiency can lead to anemia and congestive heart failure. Lack of vitamin B12 may lead to peripheral neuropathy. The recommended over-the-counter Vitamin B12 dose intake for deficiency is 125 to 2,000 micrograms of cyanocobalamin taken by mouth, daily.  Normal Vitamin B-12 levels are between 180 and 914 pg/mL, for our Lab. Patients with levels below 180 pg/ml are considered to have a Vitamin B-12 "deficiency". Older patients with levels between 200 and 500 may be symptomatic, in which case it is considered an "insufficiency". Symptoms of deficiency or insufficiency may include: tingling and numbness of the digits (fingers & toes), generalized muscle weakness, staggering, irritability, confusion, forgetfulness, tenderness, fatigue, shortness of breath, palpitation, anemia, sporadic episodes of diarrhea, decreased immune system, cognitive impairment, and degeneration of the posterior sensory columns of the spinal cord. Deficiency can lead to anemia and congestive heart failure. Lack of vitamin B12 may lead to peripheral neuropathy. The  recommended over-the-counter Vitamin B12 dose intake for deficiency is 125 to 2,000 micrograms  of cyanocobalamin taken by mouth, daily.  ______

## 2016-01-23 ENCOUNTER — Ambulatory Visit: Payer: Medicare PPO | Attending: Pain Medicine | Admitting: Pain Medicine

## 2016-01-23 ENCOUNTER — Telehealth: Payer: Self-pay | Admitting: *Deleted

## 2016-01-23 ENCOUNTER — Encounter: Payer: Self-pay | Admitting: Pain Medicine

## 2016-01-23 VITALS — BP 118/66 | HR 74 | Temp 97.8°F | Resp 17 | Ht 60.0 in | Wt 142.0 lb

## 2016-01-23 DIAGNOSIS — I2510 Atherosclerotic heart disease of native coronary artery without angina pectoris: Secondary | ICD-10-CM | POA: Diagnosis not present

## 2016-01-23 DIAGNOSIS — E785 Hyperlipidemia, unspecified: Secondary | ICD-10-CM | POA: Insufficient documentation

## 2016-01-23 DIAGNOSIS — G43909 Migraine, unspecified, not intractable, without status migrainosus: Secondary | ICD-10-CM | POA: Insufficient documentation

## 2016-01-23 DIAGNOSIS — G501 Atypical facial pain: Secondary | ICD-10-CM | POA: Insufficient documentation

## 2016-01-23 DIAGNOSIS — M533 Sacrococcygeal disorders, not elsewhere classified: Secondary | ICD-10-CM | POA: Insufficient documentation

## 2016-01-23 DIAGNOSIS — M7061 Trochanteric bursitis, right hip: Secondary | ICD-10-CM | POA: Diagnosis not present

## 2016-01-23 DIAGNOSIS — M1612 Unilateral primary osteoarthritis, left hip: Secondary | ICD-10-CM | POA: Insufficient documentation

## 2016-01-23 DIAGNOSIS — D649 Anemia, unspecified: Secondary | ICD-10-CM | POA: Insufficient documentation

## 2016-01-23 DIAGNOSIS — Z981 Arthrodesis status: Secondary | ICD-10-CM | POA: Insufficient documentation

## 2016-01-23 DIAGNOSIS — G8918 Other acute postprocedural pain: Secondary | ICD-10-CM | POA: Insufficient documentation

## 2016-01-23 DIAGNOSIS — M25552 Pain in left hip: Secondary | ICD-10-CM | POA: Insufficient documentation

## 2016-01-23 DIAGNOSIS — M109 Gout, unspecified: Secondary | ICD-10-CM | POA: Diagnosis not present

## 2016-01-23 DIAGNOSIS — M545 Low back pain: Secondary | ICD-10-CM | POA: Insufficient documentation

## 2016-01-23 DIAGNOSIS — G8929 Other chronic pain: Secondary | ICD-10-CM | POA: Diagnosis not present

## 2016-01-23 DIAGNOSIS — M47812 Spondylosis without myelopathy or radiculopathy, cervical region: Secondary | ICD-10-CM | POA: Insufficient documentation

## 2016-01-23 DIAGNOSIS — F410 Panic disorder [episodic paroxysmal anxiety] without agoraphobia: Secondary | ICD-10-CM | POA: Diagnosis not present

## 2016-01-23 DIAGNOSIS — F411 Generalized anxiety disorder: Secondary | ICD-10-CM | POA: Insufficient documentation

## 2016-01-23 DIAGNOSIS — Z79891 Long term (current) use of opiate analgesic: Secondary | ICD-10-CM | POA: Insufficient documentation

## 2016-01-23 DIAGNOSIS — M5416 Radiculopathy, lumbar region: Secondary | ICD-10-CM | POA: Diagnosis not present

## 2016-01-23 MED ORDER — LIDOCAINE HCL (PF) 1 % IJ SOLN: 10.0000 mL | Freq: Once | INTRAMUSCULAR | Status: DC

## 2016-01-23 MED ORDER — METHYLPREDNISOLONE ACETATE 80 MG/ML IJ SUSP
INTRAMUSCULAR | Status: AC
  Administered 2016-01-23: 15:00:00
  Filled 2016-01-23: qty 1

## 2016-01-23 MED ORDER — LACTATED RINGERS IV SOLN: 1000.0000 mL | Freq: Once | INTRAVENOUS | Status: DC

## 2016-01-23 MED ORDER — FENTANYL CITRATE (PF) 100 MCG/2ML IJ SOLN: 25.0000 ug | INTRAMUSCULAR | Status: DC | PRN

## 2016-01-23 MED ORDER — ROPIVACAINE HCL 2 MG/ML IJ SOLN
INTRAMUSCULAR | Status: AC
  Administered 2016-01-23: 15:00:00
  Filled 2016-01-23: qty 10

## 2016-01-23 MED ORDER — FENTANYL CITRATE (PF) 100 MCG/2ML IJ SOLN
INTRAMUSCULAR | Status: AC
  Administered 2016-01-23: 100 ug via INTRAVENOUS
  Filled 2016-01-23: qty 2

## 2016-01-23 MED ORDER — IOPAMIDOL (ISOVUE-M 200) INJECTION 41%
10.0000 mL | Freq: Once | INTRAMUSCULAR | Status: DC
  Filled 2016-01-23: qty 10

## 2016-01-23 MED ORDER — MIDAZOLAM HCL 5 MG/5ML IJ SOLN: 1.0000 mg | INTRAMUSCULAR | Status: DC | PRN

## 2016-01-23 MED ORDER — MIDAZOLAM HCL 5 MG/5ML IJ SOLN
INTRAMUSCULAR | Status: AC
  Administered 2016-01-23: 3 mg via INTRAVENOUS
  Filled 2016-01-23: qty 5

## 2016-01-23 MED ORDER — METHYLPREDNISOLONE ACETATE 80 MG/ML IJ SUSP: 80.0000 mg | Freq: Once | INTRAMUSCULAR | Status: DC

## 2016-01-23 MED ORDER — ROPIVACAINE HCL 2 MG/ML IJ SOLN: 9.0000 mL | Freq: Once | INTRAMUSCULAR | Status: DC

## 2016-01-23 NOTE — Progress Notes (Signed)
Patient's Name: Jacqueline Thornton  Patient type: Established  MRN: [id number]  Service setting: Ambulatory outpatient  DOB: [birth year]  Location: [facility]  DOS: 01/23/2016  Primary Care Physician: Lavera Guise, MD  Note by: Kathlen Brunswick Dossie Arbour, M.D, DABA, DABAPM, DABPM, DABIPP, FIPP  Referring Physician: Milinda Pointer, MD  Specialty: Board-Certified Interventional Pain Management  Last Visit to Pain Management: 12/27/2015   Primary Reason(s) for Visit: Interventional Pain Management Treatment. CC: Hip Pain (left) and Back Pain (low)  Chronic left hip pain [M25.552, G89.29]   Procedure:  Anesthesia, Analgesia, Anxiolysis:  Type: Therapeutic Intra-Articular Hip Injection Region:  Posterolateral hip joint area. Level: Lower pelvic and hip joint level. Laterality: Left-Sided  Indications: 1. Chronic hip pain (Left)   2. Trochanteric bursitis of right hip   3. Primary osteoarthritis of left hip     Pre-procedure Pain Score: 3/10 Reported level of pain is compatible with clinical observations Post-procedure Pain Score: 3   Type: Moderate (Conscious) Sedation & Local Anesthesia Local Anesthetic: Lidocaine 1% Route: Intravenous (IV) IV Access: Secured Sedation: Meaningful verbal contact was maintained at all times during the procedure  Indication(s): Analgesia & Anxiolysis   Pre-Procedure Assessment  Jacqueline Thornton is a 65 y.o. year old, female patient, seen today for interventional treatment. She has Chronic pain syndrome; Absolute anemia; Gout; HLD (hyperlipidemia); Headache, migraine; Panic attack; Chronic low back pain (Location of Secondary source of pain) (Bilateral) (midline to tailbone) (R>L); Lumbar spondylosis;  Lumbar annular disc tear (L4-5); Discogenic low back pain (L3-4 and L4-5); Lumbar facet hypertrophy; Lumbar facet syndrome (Bilateral) (R>L); Chronic neck pain; Cervical spondylosis; Hx of cervical spine surgery; Cervical spinal fusion (C6-7  interbody fusion); Coronary atherosclerosis; Long term current use of opiate analgesic; Encounter for therapeutic drug level monitoring; Uncomplicated opioid dependence ([place]); Opiate use (56.25 MME/Day); Myofascial pain syndrome; CAD in native artery; Atypical face pain; Anxiety, generalized; Muscle ache; Chronic pain; 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 Her primarily concern today is the Hip Pain (left) and Back Pain (low)   Pain Type: Chronic pain Pain Location: Hip Pain Orientation: Left Pain Descriptors / Indicators: Constant, Aching, Throbbing Pain Frequency: Constant  Date of Last Visit: 12/27/15 Service Provided on Last Visit: Med Refill  Coagulation Parameters Lab Results  Component Value Date   INR 1.0 03/02/2014   LABPROT 12.7 03/02/2014   PLT 197 01/26/2012    Verification of the correct person, correct site (including marking of site), and correct procedure were performed and confirmed by the patient.  Consent: Secured. Under the influence of no sedatives a written informed consent was obtained, after having provided information on the risks and possible complications. To fulfill our ethical and legal obligations, as recommended by the American Medical Association's Code of Ethics, we have provided information to the patient about our clinical impression; the nature and purpose of the treatment or procedure; the risks, benefits, and possible complications of the intervention; alternatives; the risk(s) and benefit(s) of the alternative treatment(s) or procedure(s); and the risk(s) and benefit(s) of doing nothing. The patient was  provided information about the risks and possible complications associated with the procedure. These include, but are not limited to, failure to achieve desired goals,  infection, bleeding, organ or nerve damage, allergic reactions, paralysis, and death. In the case of intra- or periarticular procedures these may include, but are not limited to, failure to achieve desired goals, infection, bleeding (hemarthrosis), organ or nerve damage, allergic reactions, and death. In addition, the patient was informed that Medicine is not an exact science; therefore, there is also the possibility of unforeseen risks and possible complications that may result in a catastrophic outcome. The patient indicated having understood very clearly. We have given the patient no guarantees and we have made no promises. Enough time was given to the patient to ask questions, all of which were answered to the patient's satisfaction.  Consent Attestation: I, the ordering provider, attest that I have discussed with the patient the benefits, risks, side-effects, alternatives, likelihood of achieving goals, and potential problems during recovery for the procedure that I have provided informed consent.  Pre-Procedure Preparation: Safety Precautions: Allergies reviewed. Appropriate site, procedure, and patient were confirmed by following the Joint Commission's Universal Protocol (UP.01.01.01), in the form of a "Time Out". The patient was asked to confirm marked site and procedure, before commencing. The patient was asked about blood thinners, or active infections, both of which were denied. Patient was assessed for positional comfort and all pressure points were checked before starting procedure. Allergies: She has No Known Allergies.. Infection Control Precautions: Sterile technique used. Standard Universal Precautions were taken as recommended by the Department of [facility] for Disease Control and Prevention (CDC). Standard  pre-surgical skin prep was conducted. Respiratory hygiene and cough etiquette was practiced. Hand hygiene observed. Safe injection practices and needle disposal techniques followed. SDV (single dose vial) medications used. Medications properly checked for expiration dates and contaminants. Personal protective equipment (PPE) used: Sterile Radiation-resistant gloves. Monitoring:  As per clinic protocol. Vitals:   01/23/16 1453 01/23/16 1455 01/23/16 1503 01/23/16 1513  BP: 114/67 (P) 116/70 (!) 125/56 121/68  Pulse: 71 (P) 69 72 78  Resp: 11 (P) _0 Temp:      SpO2: 100% (P) 99% 95% 100%  Weight:      Height:      Calculated BMI: Body mass index is 27.73 kg/m.  Description of Procedure Process:   Time-out: "Time-out" completed before starting procedure, as per protocol. Position: Prone Target Area: Superior aspect of the hip joint cavity, going thru the superior portion of the capsular ligament. Approach: Posterolateral approach. Area Prepped: Entire Posterolateral hip area. Prepping solution: ChloraPrep (2% chlorhexidine gluconate and 70% isopropyl alcohol) Safety Precautions: Aspiration looking for blood return was conducted prior to all injections. At no point did we inject any substances, as a needle was being advanced. No attempts were made at seeking any paresthesias. Safe injection practices and needle disposal techniques used. Medications properly checked for expiration dates. SDV (single dose vial) medications used.   Description of the Procedure: Protocol guidelines were followed. The patient was placed in position over the fluoroscopy table. The target area was identified and the area prepped in the usual manner. Skin & deeper tissues infiltrated with local anesthetic. Appropriate amount of time allowed to pass for local anesthetics to take effect. The procedure needles were then advanced to the target area. Proper needle placement secured. Negative aspiration confirmed.  Solution injected in intermittent fashion, asking for systemic symptoms every 0.5cc of injectate. The needles were then removed and the area cleansed, making sure to leave some of the prepping solution back to take advantage of its long term bactericidal properties. EBL: Minimal  Materials & Medications Used:  Needle(s) Used: 22g - 7" Spinal Needle(s) Solution Injected: 0.2% PF-Ropivacaine (34m) + SDV-DepoMedrol 80 mg/ml (182m Medications Administered today: We administered fentaNYL, midazolam, ropivacaine (PF) 2 mg/ml (0.2%), and methylPREDNISolone acetate.Please see chart orders for dosing details.  Imaging Guidance:   Type of Imaging Technique: Fluoroscopy Guidance (Non-spinal) Indication(s): Assistance in needle guidance and placement for procedures requiring needle placement in or near specific anatomical locations not easily accessible without such assistance. Exposure Time: Please see nurses notes. Contrast: Before injecting any contrast, we confirmed that the patient did not have an allergy to iodine, shellfish, or radiological contrast. Once satisfactory needle placement was completed at the desired level, radiological contrast was injected. Injection was conducted under continuous fluoroscopic guidance. Injection of contrast accomplished without complications. See chart for type and volume of contrast used. Fluoroscopic Guidance: I was personally present in the fluoroscopy suite, where the patient was placed in position for the procedure, over the fluoroscopy-compatible table. Fluoroscopy was manipulated, using "Tunnel Vision Technique", to obtain the best possible view of the target area, on the affected side. Parallax error was corrected before commencing the procedure. A "direction-depth-direction" technique was used to introduce the needle under continuous pulsed fluoroscopic guidance. Once the target was reached, antero-posterior, oblique, and lateral fluoroscopic projection views were  taken to confirm needle placement in all planes. Permanently recorded images stored by scanning into EMR. Interpretation: Intraoperative imaging interpretation by performing Physician. Adequate needle placement confirmed in AP, Lateral, & Oblique Views. Appropriate spread of contrast to desired area. No evidence of afferent or efferent intravascular uptake. No intrathecal or subarachnoid spread observed. Permanent images scanned into the patient's record.  Antibiotic Prophylaxis:  Indication(s): No indications identified. Type:  Antibiotics Given (last 72 hours)    None       Post-operative Assessment:   Complications: No immediate post-treatment complications were observed. Disposition: The patient was discharged home, once institutional criteria were met. Return to clinic in 2 weeks for follow-up evaluation and interpretation of results. The patient tolerated the entire procedure well. A repeat set of vitals were taken after the procedure and the patient was kept under observation following institutional policy, for this type of procedure. Post-procedural neurological assessment was performed, showing return to baseline, prior to discharge. The patient was provided with post-procedure discharge instructions, including a section on how to identify potential problems. Should any problems arise concerning this procedure, the patient was given instructions to immediately contact us[place]oreaat any time, without hesitation. In any case, we plan to contact the patient by telephone for a follow-up status report regarding this interventional procedure. Comments:  No additional relevant information.  Medications administered during this visit: We administered fentaNYL, midazolam, ropivacaine (PF) 2 mg/ml (0.2%), and methylPREDNISolone acetate.  Prescriptions ordered during this visit: New Prescriptions   No medications on file    Requested PM Follow-up: Return for Post-op Eval (2-wk).  Future  Appointments Date Time Provider DeClay8/21/2017 1:00 PM FrMilinda PointerMD [facility] None  03/18/2016 1:40 PM FrMilinda PointerMD [facility]    Primary Care Physician: KHLavera GuiseMD [facility] Note by: FrKathlen BrunswickaDossie ArbourM.D, DABA, DABAPM, DABPM, DABIPP, FIPP  Disclaimer:  Medicine is not an exact science. The only guarantee in medicine is that nothing is guaranteed. It is important to note that the decision to proceed with this intervention was based on the information collected from the patient. The Data and conclusions were drawn from the patient's questionnaire,  the interview, and the physical examination. Because the information was provided in large part by the patient, it cannot be guaranteed that it has not been purposely or unconsciously manipulated. Every effort has been made to obtain as much relevant data as possible for this evaluation. It is important to note that the conclusions that lead to this procedure are derived in large part from the available data. Always take into account that the treatment will also be dependent on availability of resources and existing treatment guidelines, considered by other Pain Management Practitioners as being common knowledge and practice, at the time of the intervention. For Medico-Legal purposes, it is also important to point out that variation in procedural techniques and pharmacological choices are the acceptable norm. The indications, contraindications, technique, and results of the above procedure should only be interpreted and judged by a Board-Certified Interventional Pain Specialist with extensive familiarity and expertise in the same exact procedure and technique. Attempts at providing opinions without similar or greater experience and expertise than that of the treating physician will be considered as inappropriate and unethical, and shall result in a formal complaint to the  state medical board and applicable specialty societies.

## 2016-01-23 NOTE — Patient Instructions (Signed)
Celiac Plexus Block Patient Information  Description: The celiac plexus is a group of nerves which are part of the sympathetic nervous system.  These nerves supply organs in the abdomen and pelvis.  Specific organs supplied with sensation by the celiac plexus include the stomach, liver, gallbladder, pancreas, kidneys and part of the gut.   The celiac plexus is located on both sides of the aorta at approximately the level of the first lumbar vertebral body.  The block will be performed with you lying on your abdomen with a pillow underneath.  Using direct x-ray guidance, the celiac plexus will be located on both sides of the spine.  Numbing medicine will be used to deaden the skin prior to needle insertion.  In most cases, a small amount of sedation can be given by IV prior to the numbing medicine.  Two small needles will be place near the celiac plexus and local anesthetic and steroid will be injected.  The entire block usually last about 15-25 minutes.  Conditions which may be treated by celiac plexus block:   Acute and chronic pancreatitis  Pain from liver or pancreatic cancer  Pain from Crohn's disease  Other types of abdominal or flank pain  Preparation for the injection:  1. Do not eat any solid food or dairy products within 8 hours of your appointment. 2. You may drink clear liquids up to 3 hours before appointment.  Clear liquids include water, black coffee, juice or soda.  No milk or cream please. 3. You may take your regular medication, including pain medications, with a sip of water before your appointment.  Diabetics should hold regular insulin (if taken separately) and take 1/2 normal NPH dose in the morning of the procedure.  Carry some sugar containing items with you to your appointment. 4. A driver must accompany you and be prepared to drive you home after your procedure. 5. Bring all your current medications with you. 6. An IV may be inserted and sedation may be given at the  discretion of the physician. 7. A blood pressure cuff, EKG, and other monitors will often be applied during the procedure.  Some patients may need to have extra oxygen administered for a short period. 8. You will be asked to provide medical information, including your allergies and medications, prior to the procedure.  We must know immediately if you are taking blood thinners (like Coumadin/Warfarin) or if you are allergic to IV iodine contrast (dye).  We must know if you could possible be pregnant.  Possible side-effects:   Bleeding from needle site or deeper  Infection (rarre, can require surgery)  Nerve injury (rare)  Numbness & tingling (temporary)  Collapsed lung (rare)  Spinal headache ( a headache worse with upright posture)  Light-headedness (temporary)  Pain at injection site (several days)  Decreased blood pressure (temporary)  Weakness in legs (temporary)  Seizure or other drug reaction (rare)  Call if you experience:   Fever/chills associated with headache or increased back/neck pain  Headache worsened by an upright position  New onset weakness or numbness of an extremity below the injection site.  Hives or difficulty breathing (go to the emergency room)  Inflammation or drainage at the injection site.  New onset diarrhea lasting more than 2 weeks.  New symptoms which are concerning to you  Please note:  If effective, we will often do a series of 2-3 injections spaced 3-6 weeks apart to maximally decrease your pain.  If initial series is effective, you may   be a candidate for a more permanent block of the celiac plexus. .  If you have questions, please call [phone] [facility]    Pain Management Discharge Instructions  General Discharge Instructions :  If you need to reach your doctor call: Monday-Friday 8:00 am - 4:00 pm at [phone] or toll free [phone].  After clinic hours [phone] to have  operator reach doctor.  Bring all of your medication bottles to all your appointments in the pain clinic.  To cancel or reschedule your appointment with Pain Management please remember to call 24 hours in advance to avoid a fee.  Refer to the educational materials which you have been given on: General Risks, I had my Procedure. Discharge Instructions, Post Sedation.  Post Procedure Instructions:  The drugs you were given will stay in your system until tomorrow, so for the next 24 hours you should not drive, make any legal decisions or drink any alcoholic beverages.  You may eat anything you prefer, but it is better to start with liquids then soups and crackers, and gradually work up to solid foods.  Please notify your doctor immediately if you have any unusual bleeding, trouble breathing or pain that is not related to your normal pain.  Depending on the type of procedure that was done, some parts of your body may feel week and/or numb.  This usually clears up by tonight or the next day.  Walk with the use of an assistive device or accompanied by an adult for the 24 hours.  You may use ice on the affected area for the first 24 hours.  Put ice in a Ziploc bag and cover with a towel and place against area 15 minutes on 15 minutes off.  You may switch to heat after 24 hours.

## 2016-01-23 NOTE — Progress Notes (Signed)
Safety precautions to be maintained throughout the outpatient stay will include: orient to surroundings, keep bed in low position, maintain call bell within reach at all times, provide assistance with transfer out of bed and ambulation.  

## 2016-02-19 ENCOUNTER — Ambulatory Visit: Payer: Medicare PPO | Admitting: Pain Medicine

## 2016-02-21 ENCOUNTER — Other Ambulatory Visit: Payer: Self-pay | Admitting: Neurosurgery

## 2016-02-21 DIAGNOSIS — M5416 Radiculopathy, lumbar region: Secondary | ICD-10-CM

## 2016-02-26 ENCOUNTER — Telehealth: Payer: Self-pay

## 2016-02-26 NOTE — Telephone Encounter (Signed)
The patient has a procedure scheduled for Thursday 8-31 but she is in a lot of pain. She says it is unbearable and wants to know what she should do.

## 2016-02-26 NOTE — Telephone Encounter (Signed)
Called patient to see if she wanted to come for procedure tomorrow at 1100  8-29 due to increased pain instead of 8-31.  Left message for her to call office.

## 2016-02-27 ENCOUNTER — Ambulatory Visit
Admission: RE | Admit: 2016-02-27 | Discharge: 2016-02-27 | Disposition: A | Discharge status: Home / Self Care | Payer: Medicare PPO | Source: Ambulatory Visit | Attending: Neurosurgery | Admitting: Neurosurgery

## 2016-02-27 DIAGNOSIS — M5416 Radiculopathy, lumbar region: Secondary | ICD-10-CM | POA: Diagnosis not present

## 2016-02-27 DIAGNOSIS — M519 Unspecified thoracic, thoracolumbar and lumbosacral intervertebral disc disorder: Secondary | ICD-10-CM | POA: Insufficient documentation

## 2016-02-29 ENCOUNTER — Ambulatory Visit: Payer: Medicare PPO | Attending: Pain Medicine | Admitting: Pain Medicine

## 2016-02-29 ENCOUNTER — Ambulatory Visit: Payer: Medicare PPO | Admitting: Pain Medicine

## 2016-02-29 ENCOUNTER — Encounter: Payer: Self-pay | Admitting: Pain Medicine

## 2016-02-29 VITALS — BP 131/61 | HR 80 | Temp 96.9°F | Resp 16 | Ht 60.0 in | Wt 140.0 lb

## 2016-02-29 DIAGNOSIS — M1612 Unilateral primary osteoarthritis, left hip: Secondary | ICD-10-CM | POA: Insufficient documentation

## 2016-02-29 DIAGNOSIS — M4726 Other spondylosis with radiculopathy, lumbar region: Secondary | ICD-10-CM | POA: Diagnosis not present

## 2016-02-29 DIAGNOSIS — G8929 Other chronic pain: Secondary | ICD-10-CM | POA: Insufficient documentation

## 2016-02-29 DIAGNOSIS — F410 Panic disorder [episodic paroxysmal anxiety] without agoraphobia: Secondary | ICD-10-CM | POA: Diagnosis not present

## 2016-02-29 DIAGNOSIS — G8918 Other acute postprocedural pain: Secondary | ICD-10-CM | POA: Diagnosis not present

## 2016-02-29 DIAGNOSIS — Z9889 Other specified postprocedural states: Secondary | ICD-10-CM | POA: Insufficient documentation

## 2016-02-29 DIAGNOSIS — M545 Low back pain, unspecified: Secondary | ICD-10-CM

## 2016-02-29 DIAGNOSIS — M791 Myalgia: Secondary | ICD-10-CM | POA: Insufficient documentation

## 2016-02-29 DIAGNOSIS — M542 Cervicalgia: Secondary | ICD-10-CM | POA: Diagnosis not present

## 2016-02-29 DIAGNOSIS — I2510 Atherosclerotic heart disease of native coronary artery without angina pectoris: Secondary | ICD-10-CM | POA: Diagnosis not present

## 2016-02-29 DIAGNOSIS — G501 Atypical facial pain: Secondary | ICD-10-CM | POA: Diagnosis not present

## 2016-02-29 DIAGNOSIS — M25552 Pain in left hip: Secondary | ICD-10-CM | POA: Insufficient documentation

## 2016-02-29 DIAGNOSIS — Z981 Arthrodesis status: Secondary | ICD-10-CM | POA: Insufficient documentation

## 2016-02-29 DIAGNOSIS — D649 Anemia, unspecified: Secondary | ICD-10-CM | POA: Insufficient documentation

## 2016-02-29 DIAGNOSIS — M7061 Trochanteric bursitis, right hip: Secondary | ICD-10-CM | POA: Insufficient documentation

## 2016-02-29 DIAGNOSIS — F411 Generalized anxiety disorder: Secondary | ICD-10-CM | POA: Diagnosis not present

## 2016-02-29 DIAGNOSIS — M47816 Spondylosis without myelopathy or radiculopathy, lumbar region: Secondary | ICD-10-CM | POA: Diagnosis not present

## 2016-02-29 DIAGNOSIS — Z79891 Long term (current) use of opiate analgesic: Secondary | ICD-10-CM | POA: Insufficient documentation

## 2016-02-29 DIAGNOSIS — M109 Gout, unspecified: Secondary | ICD-10-CM | POA: Insufficient documentation

## 2016-02-29 DIAGNOSIS — G43909 Migraine, unspecified, not intractable, without status migrainosus: Secondary | ICD-10-CM | POA: Diagnosis not present

## 2016-02-29 DIAGNOSIS — M47812 Spondylosis without myelopathy or radiculopathy, cervical region: Secondary | ICD-10-CM | POA: Insufficient documentation

## 2016-02-29 DIAGNOSIS — E785 Hyperlipidemia, unspecified: Secondary | ICD-10-CM | POA: Insufficient documentation

## 2016-02-29 DIAGNOSIS — M533 Sacrococcygeal disorders, not elsewhere classified: Secondary | ICD-10-CM | POA: Insufficient documentation

## 2016-02-29 MED ORDER — TRIAMCINOLONE ACETONIDE 40 MG/ML IJ SUSP
40.0000 mg | Freq: Once | INTRAMUSCULAR | Status: AC
  Administered 2016-02-29: 40 mg
  Filled 2016-02-29: qty 1

## 2016-02-29 MED ORDER — ROPIVACAINE HCL 2 MG/ML IJ SOLN
9.0000 mL | Freq: Once | INTRAMUSCULAR | Status: AC
  Administered 2016-02-29: 9 mL
  Filled 2016-02-29: qty 10

## 2016-02-29 MED ORDER — METHYLPREDNISOLONE ACETATE 80 MG/ML IJ SUSP
80.0000 mg | Freq: Once | INTRAMUSCULAR | Status: AC
  Administered 2016-02-29: 80 mg
  Filled 2016-02-29: qty 1

## 2016-02-29 MED ORDER — FENTANYL CITRATE (PF) 100 MCG/2ML IJ SOLN
25.0000 ug | INTRAMUSCULAR | Status: DC | PRN
  Filled 2016-02-29: qty 2

## 2016-02-29 MED ORDER — LACTATED RINGERS IV SOLN
1000.0000 mL | Freq: Once | INTRAVENOUS | Status: AC
  Administered 2016-02-29: 1000 mL via INTRAVENOUS

## 2016-02-29 MED ORDER — LIDOCAINE HCL (PF) 1 % IJ SOLN
10.0000 mL | Freq: Once | INTRAMUSCULAR | Status: AC
  Administered 2016-02-29: 10 mL
  Filled 2016-02-29: qty 10

## 2016-02-29 MED ORDER — TRIAMCINOLONE ACETONIDE 40 MG/ML IJ SUSP
INTRAMUSCULAR | Status: AC
  Administered 2016-02-29: 14:00:00
  Filled 2016-02-29: qty 1

## 2016-02-29 MED ORDER — ROPIVACAINE HCL 2 MG/ML IJ SOLN
INTRAMUSCULAR | Status: AC
  Administered 2016-02-29: 14:00:00
  Filled 2016-02-29: qty 10

## 2016-02-29 MED ORDER — MIDAZOLAM HCL 5 MG/5ML IJ SOLN
1.0000 mg | INTRAMUSCULAR | Status: DC | PRN
  Filled 2016-02-29: qty 5

## 2016-02-29 NOTE — Progress Notes (Signed)
Safety precautions to be maintained throughout the outpatient stay will include: orient to surroundings, keep bed in low position, maintain call bell within reach at all times, provide assistance with transfer out of bed and ambulation. Went to the hospital and had an MRI  Done last week and had tramadol ordered.

## 2016-02-29 NOTE — Progress Notes (Signed)
Tramadol tablets 16/30  50 mg wasted in toilet witnessed by patient and Louann Liv, RN.

## 2016-02-29 NOTE — Patient Instructions (Signed)
Pain Management Discharge Instructions  General Discharge Instructions :  If you need to reach your doctor call: Monday-Friday 8:00 am - 4:00 pm at [phone] or toll free [phone].  After clinic hours [phone] to have operator reach doctor.  Bring all of your medication bottles to all your appointments in the pain clinic.  To cancel or reschedule your appointment with Pain Management please remember to call 24 hours in advance to avoid a fee.  Refer to the educational materials which you have been given on: General Risks, I had my Procedure. Discharge Instructions, Post Sedation.  Post Procedure Instructions:  The drugs you were given will stay in your system until tomorrow, so for the next 24 hours you should not drive, make any legal decisions or drink any alcoholic beverages.  You may eat anything you prefer, but it is better to start with liquids then soups and crackers, and gradually work up to solid foods.  Please notify your doctor immediately if you have any unusual bleeding, trouble breathing or pain that is not related to your normal pain.  Depending on the type of procedure that was done, some parts of your body may feel week and/or numb.  This usually clears up by tonight or the next day.  Walk with the use of an assistive device or accompanied by an adult for the 24 hours.  You may use ice on the affected area for the first 24 hours.  Put ice in a Ziploc bag and cover with a towel and place against area 15 minutes on 15 minutes off.  You may switch to heat after 24 hours.Sacroiliac (SI) Joint Injection Patient Information  Description: The sacroiliac joint connects the scrum (very low back and tailbone) to the ilium (a pelvic bone which also forms half of the hip joint).  Normally this joint experiences very little motion.  When this joint becomes inflamed or unstable low back and or hip and pelvis pain may result.  Injection of this joint with local anesthetics  (numbing medicines) and steroids can provide diagnostic information and reduce pain.  This injection is performed with the aid of x-ray guidance into the tailbone area while you are lying on your stomach.   You may experience an electrical sensation down the leg while this is being done.  You may also experience numbness.  We also may ask if we are reproducing your normal pain during the injection.  Conditions which may be treated SI injection:   Low back, buttock, hip or leg pain  Preparation for the Injection:  1. Do not eat any solid food or dairy products within 8 hours of your appointment.  2. You may drink clear liquids up to 3 hours before appointment.  Clear liquids include water, black coffee, juice or soda.  No milk or cream please. 3. You may take your regular medications, including pain medications with a sip of water before your appointment.  Diabetics should hold regular insulin (if take separately) and take 1/2 normal NPH dose the morning of the procedure.  Carry some sugar containing items with you to your appointment. 4. A driver must accompany you and be prepared to drive you home after your procedure. 5. Bring all of your current medications with you. 6. An IV may be inserted and sedation may be given at the discretion of the physician. 7. A blood pressure cuff, EKG and other monitors will often be applied during the procedure.  Some patients may need to have extra oxygen   administered for a short period.  8. You will be asked to provide medical information, including your allergies, prior to the procedure.  We must know immediately if you are taking blood thinners (like Coumadin/Warfarin) or if you are allergic to IV iodine contrast (dye).  We must know if you could possible be pregnant.  Possible side effects:   Bleeding from needle site  Infection (rare, may require surgery)  Nerve injury (rare)  Numbness & tingling (temporary)  A brief convulsion or  seizure  Light-headedness (temporary)  Pain at injection site (several days)  Decreased blood pressure (temporary)  Weakness in the leg (temporary)   Call if you experience:   New onset weakness or numbness of an extremity below the injection site that last more than 8 hours.  Hives or difficulty breathing ( go to the emergency room)  Inflammation or drainage at the injection site  Any new symptoms which are concerning to you  Please note:  Although the local anesthetic injected can often make your back/ hip/ buttock/ leg feel good for several hours after the injections, the pain will likely return.  It takes 3-7 days for steroids to work in the sacroiliac area.  You may not notice any pain relief for at least that one week.  If effective, we will often do a series of three injections spaced 3-6 weeks apart to maximally decrease your pain.  After the initial series, we generally will wait some months before a repeat injection of the same type.  If you have any questions, please call [phone] [place]  What are the risk, side effects and possible complications? Generally speaking, most procedures are safe.  However, with any procedure there are risks, side effects, and the possibility of complications.  The risks and complications are dependent upon the sites that are lesioned, or the type of nerve block to be performed.  The closer the procedure is to the spine, the more serious the risks are.  Great care is taken when placing the radio frequency needles, block needles or lesioning probes, but sometimes complications can occur. 1. Infection: Any time there is an injection through the skin, there is a risk of infection.  This is why sterile conditions are used for these blocks.  There are four possible types of infection. 1. Localized skin infection. 2. Central Nervous System Infection-This can be in the  form of Meningitis, which can be deadly. 3. Epidural Infections-This can be in the form of an epidural abscess, which can cause pressure inside of the spine, causing compression of the spinal cord with subsequent paralysis. This would require an emergency surgery to decompress, and there are no guarantees that the patient would recover from the paralysis. 4. Discitis-This is an infection of the intervertebral discs.  It occurs in about 1% of discography procedures.  It is difficult to treat and it may lead to surgery.        2. Pain: the needles have to go through skin and soft tissues, will cause soreness.       3. Damage to internal structures:  The nerves to be lesioned may be near blood vessels or    other nerves which can be potentially damaged.       4. Bleeding: Bleeding is more common if the patient is taking blood thinners such as  aspirin, Coumadin, Ticiid, Plavix, etc., or if he/she have some genetic predisposition  such as hemophilia. Bleeding  into the spinal canal can cause compression of the spinal  cord with subsequent paralysis.  This would require an emergency surgery to  decompress and there are no guarantees that the patient would recover from the  paralysis.       5. Pneumothorax:  Puncturing of a lung is a possibility, every time a needle is introduced in  the area of the chest or upper back.  Pneumothorax refers to free air around the  collapsed lung(s), inside of the thoracic cavity (chest cavity).  Another two possible  complications related to a similar event would include: Hemothorax and Chylothorax.   These are variations of the Pneumothorax, where instead of air around the collapsed  lung(s), you may have blood or chyle, respectively.       6. Spinal headaches: They may occur with any procedures in the area of the spine.       7. Persistent CSF (Cerebro-Spinal Fluid) leakage: This is a rare problem, but may occur  with prolonged intrathecal or epidural catheters either due to  the formation of a fistulous  track or a dural tear.       8. Nerve damage: By working so close to the spinal cord, there is always a possibility of  nerve damage, which could be as serious as a permanent spinal cord injury with  paralysis.       9. Death:  Although rare, severe deadly allergic reactions known as "Anaphylactic  reaction" can occur to any of the medications used.      10. Worsening of the symptoms:  We can always make thing worse.  What are the chances of something like this happening? Chances of any of this occuring are extremely low.  By statistics, you have more of a chance of getting killed in a motor vehicle accident: while driving to the hospital than any of the above occurring .  Nevertheless, you should be aware that they are possibilities.  In general, it is similar to taking a shower.  Everybody knows that you can slip, hit your head and get killed.  Does that mean that you should not shower again?  Nevertheless always keep in mind that statistics do not mean anything if you happen to be on the wrong side of them.  Even if a procedure has a 1 (one) in a 1,000,000 (million) chance of going wrong, it you happen to be that one..Also, keep in mind that by statistics, you have more of a chance of having something go wrong when taking medications.  Who should not have this procedure? If you are on a blood thinning medication (e.g. Coumadin, Plavix, see list of "Blood Thinners"), or if you have an active infection going on, you should not have the procedure.  If you are taking any blood thinners, please inform your physician.  How should I prepare for this procedure?  Do not eat or drink anything at least six hours prior to the procedure.  Bring a driver with you .  It cannot be a taxi.  Come accompanied by an adult that can drive you back, and that is strong enough to help you if your legs get weak or numb from the local anesthetic.  Take all of your medicines the morning of  the procedure with just enough water to swallow them.  If you have diabetes, make sure that you are scheduled to have your procedure done first thing in the morning, whenever possible.  If you have diabetes, take only  half of your insulin dose and notify our nurse that you have done so as soon as you arrive at the clinic.  If you are diabetic, but only take blood sugar pills (oral hypoglycemic), then do not take them on the morning of your procedure.  You may take them after you have had the procedure.  Do not take aspirin or any aspirin-containing medications, at least eleven (11) days prior to the procedure.  They may prolong bleeding.  Wear loose fitting clothing that may be easy to take off and that you would not mind if it got stained with Betadine or blood.  Do not wear any jewelry or perfume  Remove any nail coloring.  It will interfere with some of our monitoring equipment.  NOTE: Remember that this is not meant to be interpreted as a complete list of all possible complications.  Unforeseen problems may occur.  BLOOD THINNERS The following drugs contain aspirin or other products, which can cause increased bleeding during surgery and should not be taken for 2 weeks prior to and 1 week after surgery.  If you should need take something for relief of minor pain, you may take acetaminophen which is found in Tylenol,m Datril, Anacin-3 and Panadol. It is not blood thinner. The products listed below are.  Do not take any of the products listed below in addition to any listed on your instruction sheet.  A.P.C or A.P.C with Codeine Codeine Phosphate Capsules #3 Ibuprofen Ridaura  ABC compound Congesprin Imuran rimadil  Advil Cope Indocin Robaxisal  Alka-Seltzer Effervescent Pain Reliever and Antacid Coricidin or Coricidin-D  Indomethacin Rufen  Alka-Seltzer plus Cold Medicine Cosprin Ketoprofen S-A-C Tablets  Anacin Analgesic Tablets or Capsules Coumadin Korlgesic Salflex  Anacin Extra  Strength Analgesic tablets or capsules CP-2 Tablets Lanoril Salicylate  Anaprox Cuprimine Capsules Levenox Salocol  Anexsia-D Dalteparin Magan Salsalate  Anodynos Darvon compound Magnesium Salicylate Sine-off  Ansaid Dasin Capsules Magsal Sodium Salicylate  Anturane Depen Capsules Marnal Soma  APF Arthritis pain formula Dewitt's Pills Measurin Stanback  Argesic Dia-Gesic Meclofenamic Sulfinpyrazone  Arthritis Bayer Timed Release Aspirin Diclofenac Meclomen Sulindac  Arthritis pain formula Anacin Dicumarol Medipren Supac  Analgesic (Safety coated) Arthralgen Diffunasal Mefanamic Suprofen  Arthritis Strength Bufferin Dihydrocodeine Mepro Compound Suprol  Arthropan liquid Dopirydamole Methcarbomol with Aspirin Synalgos  ASA tablets/Enseals Disalcid Micrainin Tagament  Ascriptin Doan's Midol Talwin  Ascriptin A/D Dolene Mobidin Tanderil  Ascriptin Extra Strength Dolobid Moblgesic Ticlid  Ascriptin with Codeine Doloprin or Doloprin with Codeine Momentum Tolectin  Asperbuf Duoprin Mono-gesic Trendar  Aspergum Duradyne Motrin or Motrin IB Triminicin  Aspirin plain, buffered or enteric coated Durasal Myochrisine Trigesic  Aspirin Suppositories Easprin Nalfon Trillsate  Aspirin with Codeine Ecotrin Regular or Extra Strength Naprosyn Uracel  Atromid-S Efficin Naproxen Ursinus  Auranofin Capsules Elmiron Neocylate Vanquish  Axotal Emagrin Norgesic Verin  Azathioprine Empirin or Empirin with Codeine Normiflo Vitamin E  Azolid Emprazil Nuprin Voltaren  Bayer Aspirin plain, buffered or children's or timed BC Tablets or powders Encaprin Orgaran Warfarin Sodium  Buff-a-Comp Enoxaparin Orudis Zorpin  Buff-a-Comp with Codeine Equegesic Os-Cal-Gesic   Buffaprin Excedrin plain, buffered or Extra Strength Oxalid   Bufferin Arthritis Strength Feldene Oxphenbutazone   Bufferin plain or Extra Strength Feldene Capsules Oxycodone with Aspirin   Bufferin with Codeine Fenoprofen Fenoprofen Pabalate or  Pabalate-SF   Buffets II Flogesic Panagesic   Buffinol plain or Extra Strength Florinal or Florinal with Codeine Panwarfarin   Buf-Tabs Flurbiprofen Penicillamine   Butalbital Compound Four-way cold tablets Penicillin  Butazolidin Fragmin Pepto-Bismol   Carbenicillin Geminisyn Percodan   Carna Arthritis Reliever Geopen Persantine   Carprofen Gold's salt Persistin   Chloramphenicol Goody's Phenylbutazone   Chloromycetin Haltrain Piroxlcam   Clmetidine heparin Plaquenil   Cllnoril Hyco-pap Ponstel   Clofibrate Hydroxy chloroquine Propoxyphen         Before stopping any of these medications, be sure to consult the physician who ordered them.  Some, such as Coumadin (Warfarin) are ordered to prevent or treat serious conditions such as "deep thrombosis", "pumonary embolisms", and other heart problems.  The amount of time that you may need off of the medication may also vary with the medication and the reason for which you were taking it.  If you are taking any of these medications, please make sure you notify your pain physician before you undergo any procedures.         Facet Joint Block, Care After Refer to this sheet in the next few weeks. These instructions provide you with information on caring for yourself after your procedure. Your health care provider may also give you more specific instructions. Your treatment has been planned according to current medical practices, but problems sometimes occur. Call your health care provider if you have any problems or questions after your procedure. HOME CARE INSTRUCTIONS   Keep track of the amount of pain relief you feel and how long it lasts.  Limit pain medicine within the first 4-6 hours after the procedure as directed by your health care provider.  Resume taking dietary supplements and medicines as directed by your health care provider.  You may resume your regular diet.  Do not apply heat near or over the injection site(s) for 24  hours.   Do not take a bath or soak in water (such as a pool or lake) for 24 hours.  Do not drive for 24 hours unless approved by your health care provider.  Avoid strenuous activity for 24 hours.  Remove your bandages the morning after the procedure.   If the injection site is tender, applying an ice pack may relieve some tenderness. To do this:  Put ice in a bag.  Place a towel between your skin and the bag.  Leave the ice on for 15-20 minutes, 3-4 times a day.  Keep follow-up appointments as directed by your health care provider. SEEK MEDICAL CARE IF:   Your pain is not controlled by your medicines.   There is drainage from the injection site.   There is significant bleeding or swelling at the injection site.  You have diabetes and your blood sugar is above 180 mg/dL. SEEK IMMEDIATE MEDICAL CARE IF:   You develop a fever of 101F (38.3C) or greater.   You have worsening pain or swelling around the injection site.   You have red streaking around the injection site.   You develop severe pain that is not controlled by your medicines.   You develop a headache, stiff neck, nausea, or vomiting.   Your eyes become very sensitive to light.   You have weakness, paralysis, or tingling in your arms or legs that was not present before the procedure.   You develop difficulty urinating or breathing.    This information is not intended to replace advice given to you by your health care provider. Make sure you discuss any questions you have with your health care provider.   Document Released: 06/03/2012 Document Revised: 07/08/2014 Document Reviewed: 06/03/2012 Elsevier Interactive Patient Education 2016 [place]  Block The facet joints connect the bones of the spine (vertebrae). They make it possible for you to bend, twist, and make other movements with your spine. They also prevent you from overbending, overtwisting, and making other excessive  movements.  A facet joint block is a procedure where a numbing medicine (anesthetic) is injected into a facet joint. Often, a type of anti-inflammatory medicine called a steroid is also injected. A facet joint block may be done for two reasons:   Diagnosis. A facet joint block may be done as a test to see whether neck or back pain is caused by a worn-down or infected facet joint. If the pain gets better after a facet joint block, it means the pain is probably coming from the facet joint. If the pain does not get better, it means the pain is probably not coming from the facet joint.   Therapy. A facet joint block may be done to relieve neck or back pain caused by a facet joint. A facet joint block is only done as a therapy if the pain does not improve with medicine, exercise programs, physical therapy, and other forms of pain management. LET [facility] CARE PROVIDER KNOW ABOUT:   Any allergies you have.   All medicines you are taking, including vitamins, herbs, eyedrops, and over-the-counter medicines and creams.   Previous problems you or members of your family have had with the use of anesthetics.   Any blood disorders you have had.   Other health problems you have. RISKS AND COMPLICATIONS Generally, having a facet joint block is safe. However, as with any procedure, complications can occur. Possible complications associated with having a facet joint block include:   Bleeding.   Injury to a nerve near the injection site.   Pain at the injection site.   Weakness or numbness in areas controlled by nerves near the injection site.   Infection.   Temporary fluid retention.   Allergic reaction to anesthetics or medicines used during the procedure. BEFORE THE PROCEDURE   Follow your health care provider's instructions if you are taking dietary supplements or medicines. You may need to stop taking them or reduce your dosage.   Do not take any new dietary supplements or  medicines without asking your health care provider first.   Follow your health care provider's instructions about eating and drinking before the procedure. You may need to stop eating and drinking several hours before the procedure.   Arrange to have an adult drive you home after the procedure. PROCEDURE  You may need to remove your clothing and dress in an open-back gown so that your health care provider can access your spine.   The procedure will be done while you are lying on an X-ray table. Most of the time you will be asked to lie on your stomach, but you may be asked to lie in a different position if an injection will be made in your neck.   Special machines will be used to monitor your oxygen levels, heart rate, and blood pressure.   If an injection will be made in your neck, an intravenous (IV) tube will be inserted into one of your veins. Fluids and medicine will flow directly into your body through the IV tube.   The area over the facet joint where the injection will be made will be cleaned with an antiseptic soap. The surrounding skin will be covered with sterile drapes.   An anesthetic will be applied to your skin to  make the injection area numb. You may feel a temporary stinging or burning sensation.   A video X-ray machine will be used to locate the joint. A contrast dye may be injected into the facet joint area to help with locating the joint.   When the joint is located, an anesthetic medicine will be injected into the joint through the needle.   Your health care provider will ask you whether you feel pain relief. If you do feel relief, a steroid may be injected to provide pain relief for a longer period of time. If you do not feel relief or feel only partial relief, additional injections of an anesthetic may be made in other facet joints.   The needle will be removed, the skin will be cleansed, and bandages will be applied.  AFTER THE PROCEDURE   You will be  observed for 15-30 minutes before being allowed to go home. Do not drive. Have an adult drive you or take a taxi or public transportation instead.   If you feel pain relief, the pain will return in several hours or days when the anesthetic wears off.   You may feel pain relief 2-14 days after the procedure. The amount of time this relief lasts varies from person to person.   It is normal to feel some tenderness over the injected area(s) for 2 days following the procedure.   If you have diabetes, you may have a temporary increase in blood sugar.   This information is not intended to replace advice given to you by your health care provider. Make sure you discuss any questions you have with your health care provider.   Document Released: 11/06/2006 Document Revised: 07/08/2014 Document Reviewed: 04/06/2012 Elsevier Interactive Patient Education [employer].

## 2016-02-29 NOTE — Progress Notes (Signed)
Patient's Name: Jacqueline Thornton  Patient type: Established  MRN: [id number]  Service setting: Ambulatory outpatient  DOB: [birth year]  Location: [facility]  DOS: 02/29/2016  Attending Provider: Milinda Pointer, MD  PCP: Lavera Guise, MD  Last Encounter: 02/26/2016  Referring Provider: Milinda Pointer, MD     Primary Reason for Visit: Interventional Pain Management Treatment. CC: Back Pain (lower)   Procedure:  Anesthesia, Analgesia, Anxiolysis:  Procedure #1: Type: Diagnostic Medial Branch Facet Block Region: Lumbar Level: L2, L3, L4, L5, & S1 Medial Branch Level(s) Laterality: Bilateral  Procedure #2: Type: Diagnostic Sacroiliac Joint Block Region: Posterior Lumbosacral Level: PSIS (Posterior Superior Iliac Spine) Sacroiliac Joint Laterality: Bilateral  Type: Moderate (Conscious) Sedation & Local Anesthesia Local Anesthetic: Lidocaine 1% Route: Intravenous (IV) IV Access: Secured Sedation: Meaningful verbal contact was maintained at all times during the procedure  Indication(s): Analgesia & Anxiolysis   Indications: 1. Lumbar spondylosis, unspecified spinal osteoarthritis   2. Lumbar facet syndrome (Bilateral) (R>L)   3. Chronic low back pain (Location of Secondary source of pain) (Bilateral) (midline to tailbone) (R>L)   4. Chronic sacroiliac joint pain     Pre-procedure Pain Score: 6/10 Post-procedure Pain Score: 0-No pain/10  Pre-Procedure Assessment  Jacqueline Thornton is a 65 y.o. year old, female patient, seen today for interventional treatment. She has Chronic pain syndrome; Absolute anemia; Gout; HLD (hyperlipidemia); Headache, migraine; Panic attack; Chronic low back pain (Location of Secondary source of pain) (Bilateral) (midline to tailbone) (R>L); Lumbar spondylosis;  Lumbar annular disc tear (L4-5); Discogenic low back pain (L3-4 and L4-5); Lumbar facet hypertrophy; Lumbar facet syndrome (Bilateral) (R>L); Chronic neck pain; Cervical  spondylosis; Hx of cervical spine surgery; Cervical spinal fusion (C6-7 interbody fusion); Coronary atherosclerosis; Long term current use of opiate analgesic; Encounter for therapeutic drug level monitoring; Uncomplicated opioid dependence ([place]); Opiate use (56.25 MME/Day); Myofascial pain syndrome; CAD in native artery; Atypical face pain; Anxiety, generalized; Muscle ache; Chronic pain; 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 Her primarily concern today is the Back Pain (lower)  We continue to have significant difficulty with [facility] and getting the procedures that we need approved. Because of the roadblocks that they keep throwing in front of the patient, she is miserable and we have not been able to control the pain. She is having to have more visits to the emergency room, more procedures done, and overall more exposure to more risk and possible complications.  Pain Type: Chronic pain Pain Location: Back Pain Orientation: Lower Pain Descriptors / Indicators: Aching, Burning, Constant, Radiating, Sharp (more burning on the left back than right back after picking up a mattress) Pain Frequency: Constant  Date of Last Visit: 01/23/16 Service Provided on Last Visit: Procedure (hip injection)  Coagulation Parameters Lab Results  Component Value Date   INR 1.0 03/02/2014   LABPROT 12.7 03/02/2014   PLT 197 01/26/2012    Verification of the correct person, correct site (including marking of site), and correct procedure were performed and confirmed by the patient.  Consent: Secured. Under the influence of no sedatives a written informed consent was  obtained, after having provided information on the risks and possible complications. To fulfill our ethical and legal obligations,  as recommended by the American Medical Association's Code of Ethics, we have provided information to the patient about our clinical impression; the nature and purpose of the treatment or procedure; the risks, benefits, and possible complications of the intervention; alternatives; the risk(s) and benefit(s) of the alternative treatment(s) or procedure(s); and the risk(s) and benefit(s) of doing nothing. The patient was provided information about the risks and possible complications associated with the procedure. These include, but are not limited to, failure to achieve desired goals, infection, bleeding, organ or nerve damage, allergic reactions, paralysis, and death. In the case of spinal procedures these may include, but are not limited to, failure to achieve desired goals, infection, bleeding, organ or nerve damage, allergic reactions, paralysis, and death. In the case of intra- or periarticular procedures these may include, but are not limited to, failure to achieve desired goals, infection, bleeding (hemarthrosis), organ or nerve damage, allergic reactions, and death. In addition, the patient was informed that Medicine is not an exact science; therefore, there is also the possibility of unforeseen risks and possible complications that may result in a catastrophic outcome. The patient indicated having understood very clearly. We have given the patient no guarantees and we have made no promises. Enough time was given to the patient to ask questions, all of which were answered to the patient's satisfaction.  Consent Attestation: I, the ordering provider, attest that I have discussed with the patient the benefits, risks, side-effects, alternatives, likelihood of achieving goals, and potential problems during recovery for the procedure that I have provided informed  consent.  Pre-Procedure Preparation: Safety Precautions: Allergies reviewed. Appropriate site, procedure, and patient were confirmed by following the Joint Commission's Universal Protocol (UP.01.01.01), in the form of a "Time Out". The patient was asked to confirm marked site and procedure, before commencing. The patient was asked about blood thinners, or active infections, both of which were denied. Patient was assessed for positional comfort and all pressure points were checked before starting procedure. Infection Control Precautions: Sterile technique used. Standard Universal Precautions were taken as recommended by the Department of [facility] for Disease Control and Prevention (CDC). Standard pre-surgical skin prep was conducted. Respiratory hygiene and cough etiquette was practiced. Hand hygiene observed. Safe injection practices and needle disposal techniques followed. SDV (single dose vial) medications used. Medications properly checked for expiration dates and contaminants. Personal protective equipment (PPE) used: Sterile Radiation-resistant gloves. Monitoring:  As per clinic protocol. Vitals:   02/29/16 1405 02/29/16 1412 02/29/16 1422 02/29/16 1432  BP: 124/77 (!) 147/66 (!) 150/70 131/61  Pulse: 79 74 84 80  Resp: 12 14 14 16   Temp:  (!) 96.2 F (35.7 C)  (!) 96.9 F (36.1 C)  TempSrc:      SpO2: 100% 95% 99% 98%  Weight:      Height:      Calculated BMI: Body mass index is 27.34 kg/m. Allergies: She has No Known Allergies.. Allergy Precautions: None required  Description of Procedure #1 Process:   Time-out: "Time-out" completed before starting procedure, as per protocol. Position: Prone Target Area: For Lumbar Facet blocks, the target is the groove formed by the junction of the transverse process and superior articular process. For the L5 dorsal ramus, the target is the notch between superior articular process and sacral ala. For the S1 dorsal ramus, the target is  the superior and lateral edge of the posterior S1 Sacral foramen. Approach: Paramedial approach. Area Prepped: Entire Posterior Lumbosacral Region Prepping solution: ChloraPrep (2% chlorhexidine gluconate and  70% isopropyl alcohol) Safety Precautions: Aspiration looking for blood return was conducted prior to all injections. At no point did we inject any substances, as a needle was being advanced. No attempts were made at seeking any paresthesias. Safe injection practices and needle disposal techniques used. Medications properly checked for expiration dates. SDV (single dose vial) medications used.  Description of the Procedure: Protocol guidelines were followed. The patient was placed in position over the fluoroscopy table. The target area was identified and the area prepped in the usual manner. Skin desensitized using vapocoolant spray. Skin & deeper tissues infiltrated with local anesthetic. Appropriate amount of time allowed to pass for local anesthetics to take effect. The procedure needle was introduced through the skin, ipsilateral to the reported pain, and advanced to the target area. Employing the "Medial Branch Technique", the needles were advanced to the angle made by the superior and medial portion of the transverse process, and the lateral and inferior portion of the superior articulating process of the targeted vertebral bodies. This area is known as "Burton's Eye" or the "Eye of the [place] Dog". A procedure needle was introduced through the skin, and this time advanced to the angle made by the superior and medial border of the sacral ala, and the lateral border of the S1 vertebral body. This last needle was later repositioned at the superior and lateral border of the posterior S1 foramen. Negative aspiration confirmed. Solution injected in intermittent fashion, asking for systemic symptoms every 0.5cc of injectate. The needles were then removed and the area cleansed, making sure to leave some  of the prepping solution back to take advantage of its long term bactericidal properties. Materials & Medications:  Needle(s) Used: 22g - 3.5" Spinal Needle(s)  Description of Procedure # 2 Process:   Time-out: "Time-out" completed before starting procedure, as per protocol. Position: Prone Target Area: For upper sacroiliac joint block(s), the target is the superior and posterior margin of the sacroiliac joint. Approach: Ipsilateral approach. Area Prepped: Entire Posterior Lumbosacral Region Prepping solution: Duraprep (Iodine Povacrylex [0.7% available Iodine] and Isopropyl Alcohol, 74% w/w) Safety Precautions: Aspiration looking for blood return was conducted prior to all injections. At no point did we inject any substances, as a needle was being advanced. No attempts were made at seeking any paresthesias. Safe injection practices and needle disposal techniques used. Medications properly checked for expiration dates. SDV (single dose vial) medications used. Description of the Procedure: Protocol guidelines were followed. The patient was placed in position over the fluoroscopy table. The target area was identified and the area prepped in the usual manner. Skin desensitized using vapocoolant spray. Skin & deeper tissues infiltrated with local anesthetic. Appropriate amount of time allowed to pass for local anesthetics to take effect. The procedure needle was advanced under fluoroscopic guidance into the sacroiliac joint until a firm endpoint was obtained. Proper needle placement secured. Negative aspiration confirmed. Solution injected in intermittent fashion, asking for systemic symptoms every 0.5cc of injectate. The needles were then removed and the area cleansed, making sure to leave some of the prepping solution back to take advantage of its long term bactericidal properties. EBL: None Materials & Medications:  Needle(s) Used: 22g - 3.5" Spinal Needle(s) Medication(s): see below.  Imaging  Guidance:   Type of Imaging Technique: Fluoroscopy Guidance (Spinal) Indication(s): Assistance in needle guidance and placement for procedures requiring needle placement in or near specific anatomical locations not easily accessible without such assistance. Exposure Time: Please see nurses notes. Contrast: None required. Fluoroscopic Guidance: I  was personally present in the fluoroscopy suite, where the patient was placed in position for the procedure, over the fluoroscopy-compatible table. Fluoroscopy was manipulated, using "Tunnel Vision Technique", to obtain the best possible view of the target area, on the affected side. Parallax error was corrected before commencing the procedure. A "direction-depth-direction" technique was used to introduce the needle under continuous pulsed fluoroscopic guidance. Once the target was reached, antero-posterior, oblique, and lateral fluoroscopic projection views were taken to confirm needle placement in all planes. Permanently recorded images stored by scanning into EMR. Interpretation: Intraoperative imaging interpretation by performing Physician. Adequate needle placement confirmed. Adequate needle placement confirmed in AP, lateral, & Oblique Views. No contrast injected.  Antibiotic Prophylaxis:  Indication(s): No indications identified. Type:  Antibiotics Given (last 72 hours)    None       Post-operative Assessment:   Complications: No immediate post-treatment complications were observed. Disposition: Return to clinic for follow-up evaluation. The patient tolerated the entire procedure well. A repeat set of vitals were taken after the procedure and the patient was kept under observation following institutional policy, for this procedure. Post-procedural neurological assessment was performed, showing return to baseline, prior to discharge. The patient was discharged home, once institutional criteria were met. The patient was provided with post-procedure  discharge instructions, including a section on how to identify potential problems. Should any problems arise concerning this procedure, the patient was given instructions to immediately contact us, at any time, without hesitation. In any case, we plan to contact the patient by telephone for a follow-up status report regarding this interventional procedure. Comments:  No additional relevant information.  Plan of Care   Problem List Items Addressed This Visit      High   Chronic low back pain (Location of Secondary source of pain) (Bilateral) (midline to tailbone) (R>L) (Chronic)   Relevant Medications   traMADol (ULTRAM) 50 MG tablet   methocarbamol (ROBAXIN) 750 MG tablet   fentaNYL (SUBLIMAZE) injection 25-50 mcg   triamcinolone acetonide (KENALOG-40) injection 40 mg (Completed)   methylPREDNISolone acetate (DEPO-MEDROL) injection 80 mg (Completed)   triamcinolone acetonide (KENALOG-40) 40 MG/ML injection (Completed)   Chronic sacroiliac joint pain (B) (L>R) (Chronic)   Relevant Medications   traMADol (ULTRAM) 50 MG tablet   methocarbamol (ROBAXIN) 750 MG tablet   fentaNYL (SUBLIMAZE) injection 25-50 mcg   triamcinolone acetonide (KENALOG-40) injection 40 mg (Completed)   methylPREDNISolone acetate (DEPO-MEDROL) injection 80 mg (Completed)   ropivacaine (PF) 2 mg/ml (0.2%) (NAROPIN) epidural 9 mL (Completed)   triamcinolone acetonide (KENALOG-40) 40 MG/ML injection (Completed)   Lumbar facet syndrome (Bilateral) (R>L) (Chronic)   Relevant Medications   traMADol (ULTRAM) 50 MG tablet   methocarbamol (ROBAXIN) 750 MG tablet   fentaNYL (SUBLIMAZE) injection 25-50 mcg   lactated ringers infusion 1,000 mL (Completed)   midazolam (VERSED) 5 MG/5ML injection 1-2 mg   triamcinolone acetonide (KENALOG-40) injection 40 mg (Completed)   lidocaine (PF) (XYLOCAINE) 1 % injection 10 mL (Completed)   ropivacaine (PF) 2 mg/ml (0.2%) (NAROPIN) epidural 9 mL (Completed)   methylPREDNISolone acetate  (DEPO-MEDROL) injection 80 mg (Completed)   triamcinolone acetonide (KENALOG-40) 40 MG/ML injection (Completed)   Other Relevant Orders   LUMBAR FACET(MEDIAL BRANCH NERVE BLOCK) MBNB   Lumbar spondylosis - Primary (Chronic)   Relevant Medications   traMADol (ULTRAM) 50 MG tablet   methocarbamol (ROBAXIN) 750 MG tablet   fentaNYL (SUBLIMAZE) injection 25-50 mcg   triamcinolone acetonide (KENALOG-40) injection 40 mg (Completed)   methylPREDNISolone acetate (DEPO-MEDROL) injection 80  mg (Completed)   triamcinolone acetonide (KENALOG-40) 40 MG/ML injection (Completed)    Other Visit Diagnoses   None.     Requested PM Follow-up: Return in about 2 weeks (around 03/14/2016) for Post-Procedure evaluation.  Future Appointments Date Time Provider [place]  03/18/2016 1:40 PM Milinda Pointer, MD [facility] None    Primary Care Physician: Lavera Guise, MD Location: [facility] Note by: Kathlen Brunswick Dossie Arbour, M.D, DABA, DABAPM, DABPM, DABIPP, FIPP Specialty: Board-Certified Interventional Pain Management  Disclaimer:  Medicine is not an exact science. The only guarantee in medicine is that nothing is guaranteed. It is important to note that the decision to proceed with this intervention was based on the information collected from the patient. The Data and conclusions were drawn from the patient's questionnaire, the interview, and the physical examination. Because the information was provided in large part by the patient, it cannot be guaranteed that it has not been purposely or unconsciously manipulated. Every effort has been made to obtain as much relevant data as possible for this evaluation. It is important to note that the conclusions that lead to this procedure are derived in large part from the available data. Always take into account that the treatment will also be dependent on availability of resources and existing treatment guidelines, considered by  other Pain Management Practitioners as being common knowledge and practice, at the time of the intervention. For Medico-Legal purposes, it is also important to point out that variation in procedural techniques and pharmacological choices are the acceptable norm. The indications, contraindications, technique, and results of the above procedure should only be interpreted and judged by a Board-Certified Interventional Pain Specialist with extensive familiarity and expertise in the same exact procedure and technique. Attempts at providing opinions without similar or greater experience and expertise than that of the treating physician will be considered as inappropriate and unethical, and shall result in a formal complaint to the state medical board and applicable specialty societies.

## 2016-03-01 NOTE — Telephone Encounter (Signed)
Pt states she is in a lot of pain but OK . Realizes that she needs to give it some time for the steroid to work and will call if any complications

## 2016-03-18 ENCOUNTER — Ambulatory Visit: Payer: Medicare PPO | Attending: Pain Medicine | Admitting: Pain Medicine

## 2016-03-18 ENCOUNTER — Encounter: Payer: Self-pay | Admitting: Pain Medicine

## 2016-03-18 VITALS — BP 116/88 | HR 82 | Temp 97.9°F | Resp 16 | Ht 60.0 in | Wt 140.0 lb

## 2016-03-18 DIAGNOSIS — Z981 Arthrodesis status: Secondary | ICD-10-CM | POA: Insufficient documentation

## 2016-03-18 DIAGNOSIS — M545 Low back pain, unspecified: Secondary | ICD-10-CM

## 2016-03-18 DIAGNOSIS — M5116 Intervertebral disc disorders with radiculopathy, lumbar region: Secondary | ICD-10-CM | POA: Diagnosis not present

## 2016-03-18 DIAGNOSIS — I2510 Atherosclerotic heart disease of native coronary artery without angina pectoris: Secondary | ICD-10-CM | POA: Diagnosis not present

## 2016-03-18 DIAGNOSIS — E785 Hyperlipidemia, unspecified: Secondary | ICD-10-CM | POA: Insufficient documentation

## 2016-03-18 DIAGNOSIS — M792 Neuralgia and neuritis, unspecified: Secondary | ICD-10-CM

## 2016-03-18 DIAGNOSIS — G8929 Other chronic pain: Secondary | ICD-10-CM | POA: Diagnosis not present

## 2016-03-18 DIAGNOSIS — F329 Major depressive disorder, single episode, unspecified: Secondary | ICD-10-CM | POA: Insufficient documentation

## 2016-03-18 DIAGNOSIS — Z9049 Acquired absence of other specified parts of digestive tract: Secondary | ICD-10-CM | POA: Insufficient documentation

## 2016-03-18 DIAGNOSIS — I10 Essential (primary) hypertension: Secondary | ICD-10-CM | POA: Diagnosis not present

## 2016-03-18 DIAGNOSIS — Z79891 Long term (current) use of opiate analgesic: Secondary | ICD-10-CM

## 2016-03-18 DIAGNOSIS — F419 Anxiety disorder, unspecified: Secondary | ICD-10-CM | POA: Insufficient documentation

## 2016-03-18 DIAGNOSIS — Z7189 Other specified counseling: Secondary | ICD-10-CM

## 2016-03-18 DIAGNOSIS — M797 Fibromyalgia: Secondary | ICD-10-CM | POA: Diagnosis not present

## 2016-03-18 DIAGNOSIS — M5416 Radiculopathy, lumbar region: Secondary | ICD-10-CM

## 2016-03-18 DIAGNOSIS — F1190 Opioid use, unspecified, uncomplicated: Secondary | ICD-10-CM | POA: Diagnosis not present

## 2016-03-18 DIAGNOSIS — Z90710 Acquired absence of both cervix and uterus: Secondary | ICD-10-CM | POA: Insufficient documentation

## 2016-03-18 MED ORDER — OXYCODONE-ACETAMINOPHEN 7.5-325 MG PO TABS: 1.0000 | ORAL_TABLET | Freq: Every day | ORAL | 0 refills | Status: DC | PRN

## 2016-03-18 MED ORDER — GABAPENTIN 300 MG PO CAPS: 300.0000 mg | ORAL_CAPSULE | Freq: Two times a day (BID) | ORAL | 2 refills | Status: DC

## 2016-03-18 NOTE — Progress Notes (Signed)
Patient's Name: Jacqueline Thornton  MRN: [id number]  Referring Provider: Lavera Guise, MD  DOB: Jun 30, 1951  PCP: Lavera Guise, MD  DOS: 03/18/2016  Note by: Kathlen Brunswick. Dossie Arbour, MD  Service setting: Ambulatory outpatient  Specialty: Interventional Pain Management  Location: [facility] (AMB) Pain Management Facility    Patient type: Established   Primary Reason(s) for Visit: Encounter for prescription drug management & post-procedure evaluation of chronic illness with mild to moderate exacerbation(Level of risk: moderate) CC: Back Pain (lower)  HPI  Jacqueline Thornton is a 65 y.o. year old, female patient, who returns today as an established patient. She has Chronic pain syndrome; Absolute anemia; Gout; HLD (hyperlipidemia); Headache, migraine; Panic attack; Chronic low back pain (Location of Secondary source of pain) (Bilateral) (midline to tailbone) (R>L); Lumbar spondylosis;  Lumbar annular disc tear (L4-5); Discogenic low back pain (L3-4 and L4-5); Lumbar facet hypertrophy; Lumbar facet syndrome (Bilateral) (R>L); Chronic neck pain; Cervical spondylosis; Hx of cervical spine surgery; Cervical spinal fusion (C6-7 interbody fusion); Coronary atherosclerosis; Long term current use of opiate analgesic; Encounter for therapeutic drug level monitoring; Uncomplicated opioid dependence ([place]); Opiate use (56.25 MME/Day); Myofascial pain syndrome; CAD in native artery; Atypical face pain; Anxiety, generalized; Muscle ache; Chronic pain; 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 Her primarily concern today is the Back Pain (lower)  Pain Assessment: Self-Reported Pain Score: 3               Reported level is compatible with observation.       Pain Type: Chronic pain Pain Location: Back Pain Orientation: Lower, Right Pain Descriptors / Indicators: Stabbing, Burning, Constant Pain Frequency: Constant  The patient comes into the clinics today for post-procedure evaluation on the interventional treatment done on 02/29/2016. In addition, she comes in today for pharmacological management of her chronic pain.  The patient  reports that she does not use drugs. Despite the fact that the patient has an excellent relief of the pain with the diagnostic injections, the insurance company is not cooperating in terms of trying to get her long-term benefit. It would seem and see if they would prefer that she stays on short-acting treatments rather than allowing for her to have the implant which we had already prove that help with her pain or to allow for her to have the radiofrequency which would have the potential to provide her with longer lasting benefit.  Date of Last Visit: 02/29/16 Service Provided on Last Visit: Procedure  Controlled Substance Pharmacotherapy Assessment & REMS (Risk Evaluation and Mitigation Strategy)  Analgesic: Oxycodone 7.5/325 one tablet 5 times a day. (56.25 mg/day) MME/day: 56.25 mg/day Pill Count: Oxycodone -acetaminophen 7.5-325 mg 58/150 last fill 03/01/16. Pharmacokinetics: Onset of action (Liberation/Absorption): Within expected pharmacological parameters Time to Peak effect (Distribution): Timing and results are as within normal expected parameters Duration of action (Metabolism/Excretion): Within normal limits for medication Pharmacodynamics: Analgesic Effect: More than 50% Activity Facilitation: Medication(s) allow patient to sit, stand, walk, and do the basic ADLs Perceived Effectiveness: Described as relatively effective, allowing for increase in activities of daily living (ADL) Side-effects or Adverse reactions: None reported Monitoring: [place] PMP:  Online review of the past 6-month period conducted. Compliant with practice rules and  regulations Last UDS on record: [employer] Select 13  Date Value Ref Range Status  09/27/2015 FINAL  Final    Comment:    ==================================================================== TOXASSURE SELECT 13 (MW) ==================================================================== Test                             Result       Flag       Units Drug Present and Declared for Prescription Verification   Oxycodone                      2337         EXPECTED   ng/mg creat   Oxymorphone                    368          EXPECTED   ng/mg creat   Noroxycodone                   >3802        EXPECTED   ng/mg creat   Noroxymorphone                 372          EXPECTED   ng/mg creat    Sources of oxycodone are scheduled prescription medications.    Oxymorphone, noroxycodone, and noroxymorphone are expected    metabolites of oxycodone. Oxymorphone is also available as a    scheduled prescription medication. ==================================================================== Test                      Result    Flag   Units      Ref Range   Creatinine              263              mg/dL      >=20 ==================================================================== Declared Medications:  The flagging and interpretation on this report are based on the  following declared medications.  Unexpected results may arise from  inaccuracies in the declared medications.  **Note: The testing scope of this panel includes these medications:  Oxycodone (Percocet)  **Note: The testing scope of this panel does not include following  reported medications:  Acetaminophen (Percocet)  Duloxetine (Cymbalta)  Enalapril (Vasotec)  Estradiol (Estrace)  Fenofibrate  Gabapentin  Meloxicam (Mobic)  Methocarbamol (Robaxin)  Naloxone  Omeprazole (Nexium)  Vitamin  D ==================================================================== For clinical consultation, please call [phone]. ====================================================================    UDS interpretation: Compliant          Medication Assessment Form: Reviewed. Patient indicates being compliant with therapy Treatment compliance: Compliant Risk Assessment: Aberrant Behavior: None observed today Substance Use Disorder (SUD) Risk Level: Low-to-moderate Risk of opioid abuse or dependence: 0.7-3.0% with doses ? 36 MME/day and 6.1-26% with doses ? 120 MME/day. Opioid Risk Tool (ORT) Score:  5 Moderate Risk for SUD (Score between 4-7) Depression Scale Score: PHQ-2: PHQ-2 Total Score: 0 No depression (0) PHQ-9: PHQ-9 Total Score: 0 No depression (0-4)  Pharmacologic Plan: No change in therapy, at this time  Post-Procedure Assessment  Procedure done on 02/29/2016: Diagnostic bilateral lumbar facet block + diagnostic bilateral sacroiliac joint block under fluoroscopic guidance and IV sedation. Complications experienced at the time of the procedure: None Side-effects or Adverse reactions: None reported Sedation: Sedation provided. When no sedatives are used, the analgesic levels obtained are directly associated with the effectiveness  of the local anesthetics. On the other hand, when sedation is provided, the level of analgesia obtained during the initial 1 hour immediately following the intervention, is believed to be the result of a combination of factors. These factors may include, but are not limited to: 1. The effectiveness of the local anesthetics used. 2. The effects of the analgesic(s) and/or anxiolytic(s) used. 3. The degree of discomfort experienced by the patient at the time of the procedure. 4. The patients ability and reliability in recalling and recording the events. 5. The presence and influence of possible secondary gains. Results: Relief during the 1st hour after the  procedure: 100 % (Ultra-Short Term Relief) Interpretation note: Analgesia during this period is likely to be Local Anesthetic and/or IV Sedative (Analgesic/Anxiolitic) related Local Anesthesia: Long-acting (4-6 hours) anesthetics used. The analgesic levels attained during this period are directly associated to the localized infiltration of local anesthetics and therefore cary significant diagnostic value as to the etiological location or origin of the pain. Results: Relief during the next 4 to 6 hour after the procedure: 100 % (Short Term Relief) Interpretation note: Complete relief confirms area to be the source of pain Long-Term Therapy: Steroids used. Results: Extended relief following procedure: 60 % Interpretation note: Long-term benefit would suggest an inflammatory etiology to the pain         Long-Term Benefits:  Current Relief (Now): 50%  Interpretation note: Persistent relief would suggest effective anti-inflammatory effects from steroids Interpretation of Results: The results of this diagnostic bilateral lumbar facet and SI joint block would suggest these 2 regions to be significantly involved in the patient's etiology of the low back pain.  Laboratory Chemistry  Inflammation Markers Lab Results  Component Value Date   ESRSEDRATE 8 08/23/2015   CRP <0.5 08/23/2015   Renal Function Lab Results  Component Value Date   BUN 15 08/23/2015   CREATININE 0.77 08/23/2015   GFRAA >60 08/23/2015   GFRNONAA >60 08/23/2015   Hepatic Function Lab Results  Component Value Date   AST 16 08/23/2015   ALT 13 (L) 08/23/2015   ALBUMIN 3.9 08/23/2015   Electrolytes Lab Results  Component Value Date   NA 140 08/23/2015   K 3.7 08/23/2015   CL 106 08/23/2015   CALCIUM 9.1 08/23/2015   MG 2.2 08/23/2015   Pain Modulating Vitamins Lab Results  Component Value Date   25OHVITD1 28 (L) 12/27/2015   25OHVITD2 <1.0 12/27/2015   25OHVITD3 28 12/27/2015   VITAMINB12 161 (L) 12/27/2015    Coagulation Parameters Lab Results  Component Value Date   INR 1.0 03/02/2014   LABPROT 12.7 03/02/2014   PLT 197 01/26/2012   Cardiovascular Lab Results  Component Value Date   HGB 14.2 01/26/2012   HCT 41.1 01/26/2012   Note: Lab results reviewed.  Recent Diagnostic Imaging  Mr Lumbar Spine Wo Contrast  Result Date: 02/27/2016 CLINICAL DATA:  No SX; Injured 5 weeks ago lifting a mattress; c/o LBP with life side buttock, hip, and leg burning EXAM: MRI LUMBAR SPINE WITHOUT CONTRAST TECHNIQUE: Multiplanar, multisequence MR imaging of the lumbar spine was performed. No intravenous contrast was administered. COMPARISON:  09/18/2015 FINDINGS: Segmentation:  Lowest open interspace assigned L5-S1, as before. Alignment:  Normal Vertebrae:  Normal signal.  No fracture. Conus medullaris: Extends to the L1 level and appears normal. Paraspinal and other soft tissues: Unremarkable Disc levels: T12-L1:  Unremarkable L1-2:  Unremarkable L2-3:  Minimal bulge.  Central canal and foramina patent. L3-4: Mild circumferential bulge. Central canal  and foramina patent. L4-5: Minimal bulge. Early bilateral facet DJD. Central canal and foramina patent. L5-S1: Small central bulge. Mild bilateral facet DJD. Central canal and foramina patent. IMPRESSION: 1. Negative for fracture or other acute abnormality. 2. Mild disc and facet disease L2-S1 as enumerated above, stable since prior exam. No compressive pathology. Electronically Signed   By: Lucrezia Europe M.D.   On: 02/27/2016 10:32    Meds  The patient has a current medication list which includes the following prescription(s): cholecalciferol, duloxetine, enalapril, esomeprazole, estradiol, gabapentin, methocarbamol, oxycodone-acetaminophen, oxycodone-acetaminophen, and oxycodone-acetaminophen.  Current Outpatient Prescriptions on File Prior to Visit  Medication Sig  . cholecalciferol (VITAMIN D) 400 UNITS TABS tablet Take 1,000 Units by mouth daily.  . DULoxetine  (CYMBALTA) 60 MG capsule Take 60 mg by mouth daily.   . enalapril (VASOTEC) 5 MG tablet Take 5 mg by mouth daily.  Marland Kitchen esomeprazole (NEXIUM) 20 MG capsule Take 20 mg by mouth daily at 12 noon.  Marland Kitchen estradiol (ESTRACE) 0.5 MG tablet Take 0.5 mg by mouth daily.   . methocarbamol (ROBAXIN) 750 MG tablet 750 mg 3 (three) times daily.    No current facility-administered medications on file prior to visit.     ROS  Constitutional: Denies any fever or chills Gastrointestinal: No reported hemesis, hematochezia, vomiting, or acute GI distress Musculoskeletal: Denies any acute onset joint swelling, redness, loss of ROM, or weakness Neurological: No reported episodes of acute onset apraxia, aphasia, dysarthria, agnosia, amnesia, paralysis, loss of coordination, or loss of consciousness  Allergies  Ms. Stonerock has No Known Allergies.  [place]  Medical:  Ms. Thole  has a past medical history of Angina pectoris ([place]); Anxiety; Arthritis; Arthropathy of sacroiliac joint (04/11/2015); Asthma; CAD (coronary artery disease); Chronic back pain; Depression; Fibromyalgia; GERD (gastroesophageal reflux disease); 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) (04/11/2015); Hyperlipidemia; Hypertension; Low back pain (04/06/2015); Lumbar radicular pain (04/18/2015); Migraine; Narrowing of intervertebral disc space (04/11/2015); Sacroiliac joint pain (04/11/2015); and Spine disorder. Family: family history includes Diabetes in her father and mother; Hypertension in her sister; Stroke in her mother. Surgical:  has a past surgical history that includes Cholecystectomy; Abdominal hysterectomy; Neck surgery (Bilateral); Appendectomy; Neck surgery; Shoulder surgery; and Colonoscopy with propofol (N/A, 07/19/2015). Tobacco:  reports that she has quit smoking. She has never used smokeless tobacco. Alcohol:  reports that she does not drink alcohol. Drug:  reports that she does not use drugs.  Constitutional Exam  General appearance:  Well nourished, well developed, and well hydrated. In no acute distress Vitals:   03/18/16 1358  BP: 116/88  Pulse: 82  Resp: 16  Temp: 97.9 F (36.6 C)  TempSrc: Oral  SpO2: 100%  Weight: 140 lb (63.5 kg)  Height: 5' (1.524 m)  BMI Assessment: Estimated body mass index is 27.34 kg/m as calculated from the following:   Height as of this encounter: 5' (1.524 m).   Weight as of this encounter: 140 lb (63.5 kg).   BMI interpretation: (25-29.9 kg/m2) = Overweight: This range is associated with a 20% higher incidence of chronic pain. BMI Readings from Last 4 Encounters:  03/18/16 27.34 kg/m  02/29/16 27.34 kg/m  01/23/16 27.73 kg/m  12/27/15 27.73 kg/m   Wt Readings from Last 4 Encounters:  03/18/16 140 lb (63.5 kg)  02/29/16 140 lb (63.5 kg)  01/23/16 142 lb (64.4 kg)  12/27/15 142 lb (64.4 kg)  Psych/Mental status: Alert and oriented x 3 (person, place, & time) Eyes: PERLA Respiratory: No evidence of acute respiratory distress  Cervical Spine Exam  Inspection: No masses, redness, or swelling Alignment: Symmetrical Functional ROM: ROM appears unrestricted Stability: No instability detected Muscle strength & Tone: Functionally intact Sensory: Unimpaired Palpation: Non-contributory  Upper Extremity (UE) Exam    Side: Right upper extremity  Side: Left upper extremity  Inspection: No masses, redness, swelling, or asymmetry  Inspection: No masses, redness, swelling, or asymmetry  Functional ROM: ROM appears unrestricted          Functional ROM: ROM appears unrestricted          Muscle strength & Tone: Functionally intact  Muscle strength & Tone: Functionally intact  Sensory: Unimpaired  Sensory: Unimpaired  Palpation: Non-contributory  Palpation: Non-contributory   Thoracic Spine Exam  Inspection: No masses, redness, or swelling Alignment: Symmetrical Functional ROM: ROM appears unrestricted Stability: No instability detected Sensory: Unimpaired Muscle strength &  Tone: Functionally intact Palpation: Non-contributory  Lumbar Spine Exam  Inspection: No masses, redness, or swelling Alignment: Symmetrical Functional ROM: Improved after treatment Stability: No instability detected Muscle strength & Tone: Functionally intact Sensory: Unimpaired Palpation: Non-contributory Provocative Tests: Lumbar Hyperextension and rotation test: evaluation deferred today       Patrick's Maneuver: evaluation deferred today              Gait & Posture Assessment  Ambulation: Unassisted Gait: Relatively normal for age and body habitus Posture: WNL   Lower Extremity Exam    Side: Right lower extremity  Side: Left lower extremity  Inspection: No masses, redness, swelling, or asymmetry  Inspection: No masses, redness, swelling, or asymmetry  Functional ROM: ROM appears unrestricted          Functional ROM: ROM appears unrestricted          Muscle strength & Tone: Functionally intact  Muscle strength & Tone: Functionally intact  Sensory: Unimpaired  Sensory: Unimpaired  Palpation: Non-contributory  Palpation: Non-contributory    Assessment & Plan  Primary Diagnosis & Pertinent Problem List: The primary encounter diagnosis was Chronic pain. Diagnoses of Encounter for chronic pain management, Long term current use of opiate analgesic, Opiate use (56.25 MME/Day), Chronic lumbar radicular pain (Location of Primary Source of Pain) (Left) (S1 Dermatome), Chronic low back pain (Location of Secondary source of pain) (Bilateral) (midline to tailbone) (R>L), and Neurogenic pain were also pertinent to this visit.  Visit Diagnosis: 1. Chronic pain   2. Encounter for chronic pain management   3. Long term current use of opiate analgesic   4. Opiate use (56.25 MME/Day)   5. Chronic lumbar radicular pain (Location of Primary Source of Pain) (Left) (S1 Dermatome)   6. Chronic low back pain (Location of Secondary source of pain) (Bilateral) (midline to tailbone) (R>L)   7.  Neurogenic pain     Problems updated and reviewed during this visit: No problems updated.  Problem-specific Plan(s): No problem-specific Assessment & Plan notes found for this encounter.  No new Assessment & Plan notes have been filed under this hospital service since the last note was generated. Service: Pain Management   Plan of Care   Problem List Items Addressed This Visit      High   Chronic low back pain (Location of Secondary source of pain) (Bilateral) (midline to tailbone) (R>L) (Chronic)   Relevant Medications   oxyCODONE-acetaminophen (PERCOCET) 7.5-325 MG tablet (Start on 03/31/2016)   oxyCODONE-acetaminophen (PERCOCET) 7.5-325 MG tablet (Start on 04/30/2016)   oxyCODONE-acetaminophen (PERCOCET) 7.5-325 MG tablet (Start on 05/30/2016)   Chronic lumbar radicular pain (Location of Primary Source of  Pain) (Left) (S1 Dermatome) (Chronic)   Chronic pain - Primary (Chronic)   Relevant Medications   oxyCODONE-acetaminophen (PERCOCET) 7.5-325 MG tablet (Start on 03/31/2016)   oxyCODONE-acetaminophen (PERCOCET) 7.5-325 MG tablet (Start on 04/30/2016)   oxyCODONE-acetaminophen (PERCOCET) 7.5-325 MG tablet (Start on 05/30/2016)   gabapentin (NEURONTIN) 300 MG capsule (Start on 03/31/2016)   Neurogenic pain (Chronic)   Relevant Medications   gabapentin (NEURONTIN) 300 MG capsule (Start on 03/31/2016)     Medium   Encounter for chronic pain management   Long term current use of opiate analgesic (Chronic)   Opiate use (56.25 MME/Day) (Chronic)    Other Visit Diagnoses   None.      Pharmacotherapy (Medications Ordered): Meds ordered this encounter  Medications  . oxyCODONE-acetaminophen (PERCOCET) 7.5-325 MG tablet    Sig: Take 1 tablet by mouth 5 (five) times daily as needed for severe pain.    Dispense:  150 tablet    Refill:  0    Do not place this medication, or any other prescription from our practice, on "Automatic Refill". Patient may have prescription filled one  day early if pharmacy is closed on scheduled refill date. Do not fill until: 03/31/16 To last until: 04/30/16  . oxyCODONE-acetaminophen (PERCOCET) 7.5-325 MG tablet    Sig: Take 1 tablet by mouth 5 (five) times daily as needed for severe pain.    Dispense:  150 tablet    Refill:  0    Do not place this medication, or any other prescription from our practice, on "Automatic Refill". Patient may have prescription filled one day early if pharmacy is closed on scheduled refill date. Do not fill until: 04/30/16 To last until: 05/30/16  . oxyCODONE-acetaminophen (PERCOCET) 7.5-325 MG tablet    Sig: Take 1 tablet by mouth 5 (five) times daily as needed for severe pain.    Dispense:  150 tablet    Refill:  0    Do not place this medication, or any other prescription from our practice, on "Automatic Refill". Patient may have prescription filled one day early if pharmacy is closed on scheduled refill date. Do not fill until: 05/30/16 To last until: 06/29/16  . gabapentin (NEURONTIN) 300 MG capsule    Sig: Take 1 capsule (300 mg total) by mouth 2 (two) times daily.    Dispense:  60 capsule    Refill:  2    Do not add this medication to the electronic "Automatic Refill" notification system. Patient may have prescription filled one day early if pharmacy is closed on scheduled refill date.    Lab-work & Procedure Ordered: No orders of the defined types were placed in this encounter.   Imaging Ordered: None  IInterventional Therapies: Scheduled:   Right sided lumbar facet radiofrequency ablation + Left S-I Block,  under fluoroscopic guidance and IV sedation.    Considering:   Diagnostic cervical epidural steroid injection under fluoroscopic guidance, with a without sedation.  Diagnostic bilateral cervical facet block under fluoroscopic guidance and IV sedation.  Possible bilateral cervical facet radiofrequency ablation under fluoroscopic guidance and IV sedation.  Diagnostic bilateral lumbar  facet block under fluoroscopic guidance and IV sedation.  Possible bilateral lumbar facet radiofrequency ablation under fluoroscopic guidance and IV sedation.  Diagnostic left L4-5 lumbar epidural steroid injection under fluoroscopic guidance, with a without IV sedation.  Diagnostic left caudal epidural steroid injection under fluoroscopic guidance, with a without sedation.  Diagnostic left S1 selective nerve root block under fluoroscopic guidance, with a without sedation.  Diagnostic  bilateral sacroiliac joint block under fluoroscopic guidance, with a without sedation.  Possible bilateral sacroiliac joint radiofrequency ablation under fluoroscopic guidance and IV sedation.  Diagnostic right trochanteric bursa injection under fluoroscopic guidance, no sedation.    PRN Procedures:   Diagnostic cervical epidural steroid injection under fluoroscopic guidance, with a without sedation.  Diagnostic bilateral cervical facet block under fluoroscopic guidance and IV sedation.  Diagnostic bilateral lumbar facet block under fluoroscopic guidance and IV sedation.  Diagnostic left L4-5 lumbar epidural steroid injection under fluoroscopic guidance, with a without IV sedation.  Diagnostic left caudal epidural steroid injection under fluoroscopic guidance, with a without sedation.  Diagnostic left S1 selective nerve root block under fluoroscopic guidance, with a without sedation.  Diagnostic bilateral sacroiliac joint block under fluoroscopic guidance, with a without sedation.  Diagnostic right trochanteric bursa injection under fluoroscopic guidance, no sedation.    Referral(s) or Consult(s): None at this time.  New Prescriptions   No medications on file    Medications administered during this visit: Ms. Orendain had no medications administered during this visit.  Requested PM Follow-up: Return in 3 months (on 06/17/2016) for Med-Mgmt, In addition, (PRN) Procedure.  Future Appointments Date Time  Provider [place]  06/10/2016 11:20 AM Milinda Pointer, MD [facility] None    Primary Care Physician: Lavera Guise, MD Location: [facility] Outpatient Pain Management Facility Note by: Kathlen Brunswick Dossie Arbour, M.D, DABA, DABAPM, DABPM, DABIPP, FIPP  Pain Score Disclaimer: We use the NRS-11 scale. This is a self-reported, subjective measurement of pain severity with only modest accuracy. It is used primarily to identify changes within a particular patient. It must be understood that outpatient pain scales are significantly less accurate that those used for research, where they can be applied under ideal controlled circumstances with minimal exposure to variables. In reality, the score is likely to be a combination of pain intensity and pain affect, where pain affect describes the degree of emotional arousal or changes in action readiness caused by the sensory experience of pain. Factors such as social and work situation, setting, emotional state, anxiety levels, expectation, and prior pain experience may influence pain perception and show large inter-individual differences that may also be affected by time variables.  Patient instructions provided at this appointment:: Patient Instructions   Pain Management Discharge Instructions  General Discharge Instructions :  If you need to reach your doctor call: Monday-Friday 8:00 am - 4:00 pm at [phone] or toll free [phone].  After clinic hours [phone] to have operator reach doctor.  Bring all of your medication bottles to all your appointments in the pain clinic.  To cancel or reschedule your appointment with Pain Management please remember to call 24 hours in advance to avoid a fee.  Refer to the educational materials which you have been given on: General Risks, I had my Procedure. Discharge Instructions, Post Sedation.  Post Procedure Instructions:  The drugs you were given will stay in your system until tomorrow, so for the  next 24 hours you should not drive, make any legal decisions or drink any alcoholic beverages.  You may eat anything you prefer, but it is better to start with liquids then soups and crackers, and gradually work up to solid foods.  Please notify your doctor immediately if you have any unusual bleeding, trouble breathing or pain that is not related to your normal pain.  Depending on the type of procedure that was done, some parts of your body may feel week and/or numb.  This usually clears  up by tonight or the next day.  Walk with the use of an assistive device or accompanied by an adult for the 24 hours.  You may use ice on the affected area for the first 24 hours.  Put ice in a Ziploc bag and cover with a towel and place against area 15 minutes on 15 minutes off.  You may switch to heat after 24 hours.GENERAL RISKS AND COMPLICATIONS  What are the risk, side effects and possible complications? Generally speaking, most procedures are safe.  However, with any procedure there are risks, side effects, and the possibility of complications.  The risks and complications are dependent upon the sites that are lesioned, or the type of nerve block to be performed.  The closer the procedure is to the spine, the more serious the risks are.  Great care is taken when placing the radio frequency needles, block needles or lesioning probes, but sometimes complications can occur. 1. Infection: Any time there is an injection through the skin, there is a risk of infection.  This is why sterile conditions are used for these blocks.  There are four possible types of infection. 1. Localized skin infection. 2. Central Nervous System Infection-This can be in the form of Meningitis, which can be deadly. 3. Epidural Infections-This can be in the form of an epidural abscess, which can cause pressure inside of the spine, causing compression of the spinal cord with subsequent paralysis. This would require an emergency surgery to  decompress, and there are no guarantees that the patient would recover from the paralysis. 4. Discitis-This is an infection of the intervertebral discs.  It occurs in about 1% of discography procedures.  It is difficult to treat and it may lead to surgery.        2. Pain: the needles have to go through skin and soft tissues, will cause soreness.       3. Damage to internal structures:  The nerves to be lesioned may be near blood vessels or    other nerves which can be potentially damaged.       4. Bleeding: Bleeding is more common if the patient is taking blood thinners such as  aspirin, Coumadin, Ticiid, Plavix, etc., or if he/she have some genetic predisposition  such as hemophilia. Bleeding into the spinal canal can cause compression of the spinal  cord with subsequent paralysis.  This would require an emergency surgery to  decompress and there are no guarantees that the patient would recover from the  paralysis.       5. Pneumothorax:  Puncturing of a lung is a possibility, every time a needle is introduced in  the area of the chest or upper back.  Pneumothorax refers to free air around the  collapsed lung(s), inside of the thoracic cavity (chest cavity).  Another two possible  complications related to a similar event would include: Hemothorax and Chylothorax.   These are variations of the Pneumothorax, where instead of air around the collapsed  lung(s), you may have blood or chyle, respectively.       6. Spinal headaches: They may occur with any procedures in the area of the spine.       7. Persistent CSF (Cerebro-Spinal Fluid) leakage: This is a rare problem, but may occur  with prolonged intrathecal or epidural catheters either due to the formation of a fistulous  track or a dural tear.       8. Nerve damage: By working so close to the spinal  cord, there is always a possibility of  nerve damage, which could be as serious as a permanent spinal cord injury with  paralysis.       9. Death:  Although  rare, severe deadly allergic reactions known as "Anaphylactic  reaction" can occur to any of the medications used.      10. Worsening of the symptoms:  We can always make thing worse.  What are the chances of something like this happening? Chances of any of this occuring are extremely low.  By statistics, you have more of a chance of getting killed in a motor vehicle accident: while driving to the hospital than any of the above occurring .  Nevertheless, you should be aware that they are possibilities.  In general, it is similar to taking a shower.  Everybody knows that you can slip, hit your head and get killed.  Does that mean that you should not shower again?  Nevertheless always keep in mind that statistics do not mean anything if you happen to be on the wrong side of them.  Even if a procedure has a 1 (one) in a 1,000,000 (million) chance of going wrong, it you happen to be that one..Also, keep in mind that by statistics, you have more of a chance of having something go wrong when taking medications.  Who should not have this procedure? If you are on a blood thinning medication (e.g. Coumadin, Plavix, see list of "Blood Thinners"), or if you have an active infection going on, you should not have the procedure.  If you are taking any blood thinners, please inform your physician.  How should I prepare for this procedure?  Do not eat or drink anything at least six hours prior to the procedure.  Bring a driver with you .  It cannot be a taxi.  Come accompanied by an adult that can drive you back, and that is strong enough to help you if your legs get weak or numb from the local anesthetic.  Take all of your medicines the morning of the procedure with just enough water to swallow them.  If you have diabetes, make sure that you are scheduled to have your procedure done first thing in the morning, whenever possible.  If you have diabetes, take only half of your insulin dose and notify our nurse  that you have done so as soon as you arrive at the clinic.  If you are diabetic, but only take blood sugar pills (oral hypoglycemic), then do not take them on the morning of your procedure.  You may take them after you have had the procedure.  Do not take aspirin or any aspirin-containing medications, at least eleven (11) days prior to the procedure.  They may prolong bleeding.  Wear loose fitting clothing that may be easy to take off and that you would not mind if it got stained with Betadine or blood.  Do not wear any jewelry or perfume  Remove any nail coloring.  It will interfere with some of our monitoring equipment.  NOTE: Remember that this is not meant to be interpreted as a complete list of all possible complications.  Unforeseen problems may occur.  BLOOD THINNERS The following drugs contain aspirin or other products, which can cause increased bleeding during surgery and should not be taken for 2 weeks prior to and 1 week after surgery.  If you should need take something for relief of minor pain, you may take acetaminophen which is found in Tylenol,m  Datril, Anacin-3 and Panadol. It is not blood thinner. The products listed below are.  Do not take any of the products listed below in addition to any listed on your instruction sheet.  A.P.C or A.P.C with Codeine Codeine Phosphate Capsules #3 Ibuprofen Ridaura  ABC compound Congesprin Imuran rimadil  Advil Cope Indocin Robaxisal  Alka-Seltzer Effervescent Pain Reliever and Antacid Coricidin or Coricidin-D  Indomethacin Rufen  Alka-Seltzer plus Cold Medicine Cosprin Ketoprofen S-A-C Tablets  Anacin Analgesic Tablets or Capsules Coumadin Korlgesic Salflex  Anacin Extra Strength Analgesic tablets or capsules CP-2 Tablets Lanoril Salicylate  Anaprox Cuprimine Capsules Levenox Salocol  Anexsia-D Dalteparin Magan Salsalate  Anodynos Darvon compound Magnesium Salicylate Sine-off  Ansaid Dasin Capsules Magsal Sodium Salicylate  Anturane  Depen Capsules Marnal Soma  APF Arthritis pain formula Dewitt's Pills Measurin Stanback  Argesic Dia-Gesic Meclofenamic Sulfinpyrazone  Arthritis Bayer Timed Release Aspirin Diclofenac Meclomen Sulindac  Arthritis pain formula Anacin Dicumarol Medipren Supac  Analgesic (Safety coated) Arthralgen Diffunasal Mefanamic Suprofen  Arthritis Strength Bufferin Dihydrocodeine Mepro Compound Suprol  Arthropan liquid Dopirydamole Methcarbomol with Aspirin Synalgos  ASA tablets/Enseals Disalcid Micrainin Tagament  Ascriptin Doan's Midol Talwin  Ascriptin A/D Dolene Mobidin Tanderil  Ascriptin Extra Strength Dolobid Moblgesic Ticlid  Ascriptin with Codeine Doloprin or Doloprin with Codeine Momentum Tolectin  Asperbuf Duoprin Mono-gesic Trendar  Aspergum Duradyne Motrin or Motrin IB Triminicin  Aspirin plain, buffered or enteric coated Durasal Myochrisine Trigesic  Aspirin Suppositories Easprin Nalfon Trillsate  Aspirin with Codeine Ecotrin Regular or Extra Strength Naprosyn Uracel  Atromid-S Efficin Naproxen Ursinus  Auranofin Capsules Elmiron Neocylate Vanquish  Axotal Emagrin Norgesic Verin  Azathioprine Empirin or Empirin with Codeine Normiflo Vitamin E  Azolid Emprazil Nuprin Voltaren  Bayer Aspirin plain, buffered or children's or timed BC Tablets or powders Encaprin Orgaran Warfarin Sodium  Buff-a-Comp Enoxaparin Orudis Zorpin  Buff-a-Comp with Codeine Equegesic Os-Cal-Gesic   Buffaprin Excedrin plain, buffered or Extra Strength Oxalid   Bufferin Arthritis Strength Feldene Oxphenbutazone   Bufferin plain or Extra Strength Feldene Capsules Oxycodone with Aspirin   Bufferin with Codeine Fenoprofen Fenoprofen Pabalate or Pabalate-SF   Buffets II Flogesic Panagesic   Buffinol plain or Extra Strength Florinal or Florinal with Codeine Panwarfarin   Buf-Tabs Flurbiprofen Penicillamine   Butalbital Compound Four-way cold tablets Penicillin   Butazolidin Fragmin Pepto-Bismol   Carbenicillin  Geminisyn Percodan   Carna Arthritis Reliever Geopen Persantine   Carprofen Gold's salt Persistin   Chloramphenicol Goody's Phenylbutazone   Chloromycetin Haltrain Piroxlcam   Clmetidine heparin Plaquenil   Cllnoril Hyco-pap Ponstel   Clofibrate Hydroxy chloroquine Propoxyphen         Before stopping any of these medications, be sure to consult the physician who ordered them.  Some, such as Coumadin (Warfarin) are ordered to prevent or treat serious conditions such as "deep thrombosis", "pumonary embolisms", and other heart problems.  The amount of time that you may need off of the medication may also vary with the medication and the reason for which you were taking it.  If you are taking any of these medications, please make sure you notify your pain physician before you undergo any procedures.

## 2016-03-18 NOTE — Patient Instructions (Signed)

## 2016-03-18 NOTE — Progress Notes (Addendum)
Patient here for post procedure f/up and medication management.  Oxycodone -acetaminophen 7.5-325 mg 58/150 last fill 03/01/16  Safety precautions to be maintained throughout the outpatient stay will include: orient to surroundings, keep bed in low position, maintain call bell within reach at all times, provide assistance with transfer out of bed and ambulation.

## 2016-03-28 ENCOUNTER — Other Ambulatory Visit: Payer: Self-pay | Admitting: Pain Medicine

## 2016-03-28 ENCOUNTER — Encounter: Payer: Self-pay | Admitting: Pain Medicine

## 2016-03-28 DIAGNOSIS — M533 Sacrococcygeal disorders, not elsewhere classified: Secondary | ICD-10-CM

## 2016-03-28 DIAGNOSIS — G8929 Other chronic pain: Secondary | ICD-10-CM

## 2016-03-28 DIAGNOSIS — M47816 Spondylosis without myelopathy or radiculopathy, lumbar region: Secondary | ICD-10-CM

## 2016-04-25 ENCOUNTER — Telehealth: Payer: Self-pay | Admitting: Pain Medicine

## 2016-04-25 NOTE — Telephone Encounter (Signed)
Request for auth and clinical submitted for review - voice mail from representative @ [facility] on Monday 04/25/2016 - fax was not received as indicated in voicemail - follow up call to [place] confirmed that procedure bilateral lumbar RFA is DENIED - fax will be sent by end of day with details.  Patient notified of Denial and reminded of Med mgmt appointment 06/13/2016.

## 2016-05-06 ENCOUNTER — Telehealth: Payer: Self-pay | Admitting: Pain Medicine

## 2016-05-06 NOTE — Telephone Encounter (Signed)
Called patient to find out where she is having pain and she states having lots of pain in left hip.. Also states that MD did SI joint last appt and has worked. Pain 4/10.   she advised on which procedure would be best so we can get a pre auth from [facility] [employer]

## 2016-05-06 NOTE — Telephone Encounter (Signed)
Patient wants to know if she can have an injection for low back, left hip and tailbone pain. Please advise and enter order - patient must have insurance authorization   Thank you

## 2016-05-06 NOTE — Telephone Encounter (Signed)
Called pt to assess where pain is located- states she is hurting in left hip 4/10. Also states that she had a procedure of the SI joint and was helpful. Please advise on which procedure would be best so we schedule appt and get verification from insurance

## 2016-05-07 NOTE — Telephone Encounter (Signed)
Okay bring in.

## 2016-05-08 NOTE — Telephone Encounter (Signed)
Dr Dossie Arbour is going to put in an order for Caudal epidural and Left SIJI.  I have gone over pre procedure instructions with patient.  She would like to have sedation.  Could you be on the look out for this order?  Thank you.

## 2016-05-09 ENCOUNTER — Other Ambulatory Visit: Payer: Self-pay | Admitting: Pain Medicine

## 2016-05-09 DIAGNOSIS — M5416 Radiculopathy, lumbar region: Secondary | ICD-10-CM

## 2016-05-09 DIAGNOSIS — G8929 Other chronic pain: Secondary | ICD-10-CM

## 2016-05-09 DIAGNOSIS — M545 Low back pain: Secondary | ICD-10-CM

## 2016-05-09 DIAGNOSIS — M533 Sacrococcygeal disorders, not elsewhere classified: Principal | ICD-10-CM

## 2016-05-09 NOTE — Telephone Encounter (Signed)
Procedure requires prior authorization with [facility] - request faxed with clinical for review. I will contact patient with response when received.

## 2016-05-27 ENCOUNTER — Ambulatory Visit: Payer: Medicare PPO | Admitting: Pain Medicine

## 2016-06-03 ENCOUNTER — Ambulatory Visit
Admission: RE | Admit: 2016-06-03 | Discharge: 2016-06-03 | Disposition: A | Discharge status: Home / Self Care | Payer: Medicare PPO | Source: Ambulatory Visit | Attending: Pain Medicine | Admitting: Pain Medicine

## 2016-06-03 ENCOUNTER — Ambulatory Visit (HOSPITAL_BASED_OUTPATIENT_CLINIC_OR_DEPARTMENT_OTHER): Payer: Medicare PPO | Admitting: Pain Medicine

## 2016-06-03 ENCOUNTER — Encounter: Payer: Self-pay | Admitting: Pain Medicine

## 2016-06-03 VITALS — BP 121/57 | HR 77 | Temp 97.6°F | Resp 16 | Ht 60.0 in | Wt 143.0 lb

## 2016-06-03 DIAGNOSIS — M533 Sacrococcygeal disorders, not elsewhere classified: Secondary | ICD-10-CM

## 2016-06-03 DIAGNOSIS — M545 Low back pain: Secondary | ICD-10-CM | POA: Diagnosis not present

## 2016-06-03 DIAGNOSIS — G8929 Other chronic pain: Secondary | ICD-10-CM | POA: Diagnosis not present

## 2016-06-03 MED ORDER — METHYLPREDNISOLONE ACETATE 80 MG/ML IJ SUSP
80.0000 mg | Freq: Once | INTRAMUSCULAR | Status: AC
  Administered 2016-06-03: 80 mg
  Filled 2016-06-03: qty 1

## 2016-06-03 MED ORDER — LIDOCAINE HCL (PF) 1 % IJ SOLN
10.0000 mL | Freq: Once | INTRAMUSCULAR | Status: AC
  Administered 2016-06-03: 10 mL

## 2016-06-03 MED ORDER — LACTATED RINGERS IV SOLN
1000.0000 mL | Freq: Once | INTRAVENOUS | Status: AC
  Administered 2016-06-03: 1000 mL via INTRAVENOUS

## 2016-06-03 MED ORDER — MIDAZOLAM HCL 5 MG/5ML IJ SOLN
1.0000 mg | INTRAMUSCULAR | Status: DC | PRN
  Filled 2016-06-03: qty 5

## 2016-06-03 MED ORDER — ROPIVACAINE HCL 2 MG/ML IJ SOLN
4.0000 mL | Freq: Once | INTRAMUSCULAR | Status: AC
  Administered 2016-06-03: 4 mL
  Filled 2016-06-03: qty 10

## 2016-06-03 MED ORDER — FENTANYL CITRATE (PF) 100 MCG/2ML IJ SOLN
25.0000 ug | INTRAMUSCULAR | Status: DC | PRN
  Filled 2016-06-03: qty 2

## 2016-06-03 NOTE — Progress Notes (Signed)
Safety precautions to be maintained throughout the outpatient stay will include: orient to surroundings, keep bed in low position, maintain call bell within reach at all times, provide assistance with transfer out of bed and ambulation.  

## 2016-06-03 NOTE — Progress Notes (Signed)
Patient's Name: Jacqueline Thornton  MRN: [id number]  Referring Provider: Milinda Pointer, MD  DOB: [birth year]  PCP: Lavera Guise, MD  DOS: 06/03/2016  Note by: Kathlen Brunswick. Dossie Arbour, MD  Service setting: Ambulatory outpatient  Location: [facility] (AMB) Pain Management Facility  Visit type: Procedure  Specialty: Interventional Pain Management  Patient type: Established   Primary Reason for Visit: Interventional Pain Management Treatment. CC: Back Pain (left, lower)  Procedure:  Anesthesia, Analgesia, Anxiolysis:  Type: Diagnostic Sacroiliac Joint Steroid Injection Region: Superior Lumbosacral Region Level: PSIS (Posterior Superior Iliac Spine) Laterality: Left-Sided  Type: Local Anesthesia with Moderate (Conscious) Sedation Local Anesthetic: Lidocaine 1% Route: Intravenous (IV) IV Access: Secured Sedation: Meaningful verbal contact was maintained at all times during the procedure  Indication(s): Analgesia and Anxiety  Indications: 1. 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 Chronic low back pain (Location of Secondary source of pain) (Bilateral) (midline to tailbone) (R>L)    Pain Score: Pre-procedure: 5 /10 Post-procedure: 1 /10 She indicates that the pain in the sacral region going down into the tailbone is completely gone. However she still has some pain in the left posterior buttocks area and this may be coming from her hip joint.  Pre-Procedure Assessment:  Jacqueline Thornton is a 65 y.o. (year old), female patient, seen today for interventional treatment. She  has a past surgical history that includes Cholecystectomy; Abdominal hysterectomy; Neck surgery (Bilateral); Appendectomy; Neck surgery; Shoulder surgery; and Colonoscopy with propofol (N/A, 07/19/2015).. 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 A diagnosis of Chronic low back pain (Location of Secondary source of pain) (Bilateral) (midline to  tailbone) (R>L) was also pertinent to this visit.  Pain Type: Chronic pain Pain Location: Back Pain Orientation: Left Pain Descriptors / Indicators: Burning, Stabbing, Constant, Aching Pain Frequency: Constant  Date of Last Visit: 03/18/16 Service Provided on Last Visit: Med Refill, Evaluation  Coagulation Parameters Lab Results  Component Value Date   INR 1.0 03/02/2014   LABPROT 12.7 03/02/2014   PLT 197 01/26/2012   Verification of the correct person, correct site (including marking of site), and correct procedure were performed and confirmed by the patient.  Consent: Before the procedure and under the influence of no sedative(s), amnesic(s), or anxiolytics, the patient was informed of the treatment options, risks and possible complications. To fulfill our ethical and legal obligations, as recommended by the American Medical Association's Code of Ethics, I have informed the patient of my clinical impression; the nature and purpose of the treatment or procedure; the risks, benefits, and possible complications of the intervention; the alternatives, including doing nothing; the risk(s) and benefit(s) of the alternative treatment(s) or procedure(s); and the risk(s) and benefit(s) of doing nothing. The patient was provided information about the general risks and possible complications associated with the procedure. These may include, but are not limited to: failure to achieve desired goals, infection, bleeding, organ or nerve damage, allergic reactions, paralysis, and death. In addition, the patient was informed of those risks and complications associated to the procedure, such as failure to decrease pain; infection; bleeding; organ or nerve damage with subsequent damage to sensory, motor, and/or autonomic systems, resulting in permanent pain, numbness, and/or weakness of one or several areas of the body; allergic reactions; (i.e.: anaphylactic reaction); and/or death. Furthermore, the patient  was informed of those risks and complications associated with the medications. These include, but are not limited to: allergic reactions (i.e.:  anaphylactic or anaphylactoid reaction(s)); adrenal axis suppression; blood sugar elevation that in diabetics may result in ketoacidosis or comma; water retention that in patients with history of congestive heart failure may result in shortness of breath, pulmonary edema, and decompensation with resultant heart failure; weight gain; swelling or edema; medication-induced neural toxicity; particulate matter embolism and blood vessel occlusion with resultant organ, and/or nervous system infarction; and/or aseptic necrosis of one or more joints. Finally, the patient was informed that Medicine is not an exact science; therefore, there is also the possibility of unforeseen or unpredictable risks and/or possible complications that may result in a catastrophic outcome. The patient indicated having understood very clearly. We have given the patient no guarantees and we have made no promises. Enough time was given to the patient to ask questions, all of which were answered to the patient's satisfaction. Jacqueline Thornton has indicated that she wanted to continue with the procedure.  Consent Attestation: I, the ordering provider, attest that I have discussed with the patient the benefits, risks, side-effects, alternatives, likelihood of achieving goals, and potential problems during recovery for the procedure that I have provided informed consent.  Pre-Procedure Preparation:  Safety Precautions: Allergies reviewed. The patient was asked about blood thinners, or active infections, both of which were denied. The patient was asked to confirm the procedure and laterality, before marking the site, and again before commencing the procedure. Appropriate site, procedure, and patient were confirmed by following the Joint Commission's Universal Protocol (UP.01.01.01), in the form of a "Time  Out". The patient was asked to participate by confirming the accuracy of the "Time Out" information. Patient was assessed for positional comfort and pressure points before starting the procedure. Allergies: She has No Known Allergies. Allergy Precautions: None required Infection Control Precautions: Sterile technique used. Standard Universal Precautions were taken as recommended by the Department of [facility] for Disease Control and Prevention (CDC). Standard pre-surgical skin prep was conducted. Respiratory hygiene and cough etiquette was practiced. Hand hygiene observed. Safe injection practices and needle disposal techniques followed. SDV (single dose vial) medications used. Medications properly checked for expiration dates and contaminants. Personal protective equipment (PPE) used as per protocol. Monitoring:  As per clinic protocol. Vitals:   06/03/16 1154 06/03/16 1204 06/03/16 1214 06/03/16 1224  BP: 101/60 99/72 (!) 143/62 (!) 121/57  Pulse:  78 82 77  Resp: 15 16 16 16   Temp:  97.6 F (36.4 C)    SpO2: 98% 98% 99% 93%  Weight:      Height:      Calculated BMI: Body mass index is 27.93 kg/m. Time-out: "Time-out" completed before starting procedure, as per protocol.  Description of Procedure Process:  Time-out: "Time-out" completed before starting procedure, as per protocol. Position: Prone Target Area: Superior, posterior, aspect of the sacroiliac fissure Approach: Posterior, paraspinal, ipsilateral approach. Area Prepped: Entire Lower Lumbosacral Region Prepping solution: ChloraPrep (2% chlorhexidine gluconate and 70% isopropyl alcohol) Safety Precautions: Aspiration looking for blood return was conducted prior to all injections. At no point did we inject any substances, as a needle was being advanced. No attempts were made at seeking any paresthesias. Safe injection practices and needle disposal techniques used. Medications properly checked for expiration dates. SDV  (single dose vial) medications used. Description of the Procedure: Protocol guidelines were followed. The patient was placed in position over the procedure table. The target area was identified and the area prepped in the usual manner. Skin & deeper tissues infiltrated with local anesthetic. Appropriate amount of time  allowed to pass for local anesthetics to take effect. The procedure needle was advanced under fluoroscopic guidance into the sacroiliac joint until a firm endpoint was obtained. Proper needle placement secured. Negative aspiration confirmed. Solution injected in intermittent fashion, asking for systemic symptoms every 0.5cc of injectate. The needles were then removed and the area cleansed, making sure to leave some of the prepping solution back to take advantage of its long term bactericidal properties. EBL: None Materials & Medications:  Needle(s) Type: Epidural needle Gauge: 22G Length: 3.5-in Medication(s): We administered lactated ringers, methylPREDNISolone acetate, lidocaine (PF), and ropivacaine (PF) 2 mg/mL (0.2%). Please see chart orders for dosing details.  Imaging Guidance (Non-Spinal):  Type of Imaging Technique: Fluoroscopy Guidance (Non-Spinal) Indication(s): Assistance in needle guidance and placement for procedures requiring needle placement in or near specific anatomical locations not easily accessible without such assistance. Exposure Time: Please see nurses notes. Contrast: None used. Fluoroscopic Guidance: I was personally present during the use of fluoroscopy. "Tunnel Vision Technique" used to obtain the best possible view of the target area. Parallax error corrected before commencing the procedure. "Direction-depth-direction" technique used to introduce the needle under continuous pulsed fluoroscopy. Once target was reached, antero-posterior, oblique, and lateral fluoroscopic projection used confirm needle placement in all planes. Images permanently stored in  EMR. Interpretation: No contrast injected. I personally interpreted the imaging intraoperatively. Adequate needle placement confirmed in multiple planes. Permanent images saved into the patient's record.  Antibiotic Prophylaxis:  Indication(s): No indications identified. Type:  Antibiotics Given (last 72 hours)    None      Post-operative Assessment:  Complications: No immediate post-treatment complications observed by team, or reported by patient. Disposition: The patient tolerated the entire procedure well. A repeat set of vitals were taken after the procedure and the patient was kept under observation following institutional policy, for this type of procedure. Post-procedural neurological assessment was performed, showing return to baseline, prior to discharge. The patient was provided with post-procedure discharge instructions, including a section on how to identify potential problems. Should any problems arise concerning this procedure, the patient was given instructions to immediately contact us, at any time, without hesitation. In any case, we plan to contact the patient by telephone for a follow-up status report regarding this interventional procedure. Comments:  No additional relevant information.  Plan of Care  Discharge to: Discharge home  Medications ordered for procedure: Meds ordered this encounter  Medications  . fentaNYL (SUBLIMAZE) injection 25-50 mcg    Make sure Narcan is available in the pyxis when using this medication. In the event of respiratory depression (RR< 8/min): Titrate NARCAN (naloxone) in increments of 0.1 to 0.2 mg IV at 2-3 minute intervals, until desired degree of reversal.  . lactated ringers infusion 1,000 mL  . midazolam (VERSED) 5 MG/5ML injection 1-2 mg    Make sure Flumazenil is available in the pyxis when using this medication. If oversedation occurs, administer 0.2 mg IV over 15 sec. If after 45 sec no response, administer 0.2 mg again over 1 min;  may repeat at 1 min intervals; not to exceed 4 doses (1 mg)  . methylPREDNISolone acetate (DEPO-MEDROL) injection 80 mg  . lidocaine (PF) (XYLOCAINE) 1 % injection 10 mL  . ropivacaine (PF) 2 mg/mL (0.2%) (NAROPIN) injection 4 mL   Medications administered: (For more details, see medical record) We administered lactated ringers, methylPREDNISolone acetate, lidocaine (PF), and ropivacaine (PF) 2 mg/mL (0.2%). Lab-work, Procedure(s), & Referral(s) Ordered: Orders Placed This Encounter  Procedures  . DG C-Arm  1-60 Min-No Report   Imaging Ordered: No results found for this or any previous visit. New Prescriptions   No medications on file   Physician-requested Follow-up:  Return in about 2 weeks (around 06/17/2016) for Post-Procedure evaluation.  Future Appointments Date Time Provider [place]  06/13/2016 1:00 PM Milinda Pointer, MD [facility] None  07/17/2016 1:00 PM Milinda Pointer, MD [facility] None   Primary Care Physician: Lavera Guise, MD Location: [facility] Outpatient [facility] Management Facility Note by: Kathlen Brunswick. Dossie Arbour, M.D, DABA, DABAPM, DABPM, DABIPP, FIPP 12/04/171:07 PM  Disclaimer:  Medicine is not an exact science. The only guarantee in medicine is that nothing is guaranteed. It is important to note that the decision to proceed with this intervention was based on the information collected from the patient. The Data and conclusions were drawn from the patient's questionnaire, the interview, and the physical examination. Because the information was provided in large part by the patient, it cannot be guaranteed that it has not been purposely or unconsciously manipulated. Every effort has been made to obtain as much relevant data as possible for this evaluation. It is important to note that the conclusions that lead to this procedure are derived in large part from the available data. Always take into account that the treatment will also be dependent on availability of  resources and existing treatment guidelines, considered by other Pain Management Practitioners as being common knowledge and practice, at the time of the intervention. For Medico-Legal purposes, it is also important to point out that variation in procedural techniques and pharmacological choices are the acceptable norm. The indications, contraindications, technique, and results of the above procedure should only be interpreted and judged by a Board-Certified Interventional Pain Specialist with extensive familiarity and expertise in the same exact procedure and technique. Attempts at providing opinions without similar or greater experience and expertise than that of the treating physician will be considered as inappropriate and unethical, and shall result in a formal complaint to the state medical board and applicable specialty societies.  Instructions provided at this appointment: Patient Instructions  Pain Management Discharge Instructions  General Discharge Instructions :  If you need to reach your doctor call: Monday-Friday 8:00 am - 4:00 pm at [phone] or toll free [phone].  After clinic hours [phone] to have operator reach doctor.  Bring all of your medication bottles to all your appointments in the pain clinic.  To cancel or reschedule your appointment with Pain Management please remember to call 24 hours in advance to avoid a fee.  Refer to the educational materials which you have been given on: General Risks, I had my Procedure. Discharge Instructions, Post Sedation.  Post Procedure Instructions:  The drugs you were given will stay in your system until tomorrow, so for the next 24 hours you should not drive, make any legal decisions or drink any alcoholic beverages.  You may eat anything you prefer, but it is better to start with liquids then soups and crackers, and gradually work up to solid foods.  Please notify your doctor immediately if you have any unusual bleeding,  trouble breathing or pain that is not related to your normal pain.  Depending on the type of procedure that was done, some parts of your body may feel week and/or numb.  This usually clears up by tonight or the next day.  Walk with the use of an assistive device or accompanied by an adult for the 24 hours.  You may use ice on the affected area for  the first 24 hours.  Put ice in a Ziploc bag and cover with a towel and place against area 15 minutes on 15 minutes off.  You may switch to heat after 24 hours.Sacroiliac (SI) Joint Injection Patient Information  Description: The sacroiliac joint connects the scrum (very low back and tailbone) to the ilium (a pelvic bone which also forms half of the hip joint).  Normally this joint experiences very little motion.  When this joint becomes inflamed or unstable low back and or hip and pelvis pain may result.  Injection of this joint with local anesthetics (numbing medicines) and steroids can provide diagnostic information and reduce pain.  This injection is performed with the aid of x-ray guidance into the tailbone area while you are lying on your stomach.   You may experience an electrical sensation down the leg while this is being done.  You may also experience numbness.  We also may ask if we are reproducing your normal pain during the injection.  Conditions which may be treated SI injection:   Low back, buttock, hip or leg pain  Preparation for the Injection:  1. Do not eat any solid food or dairy products within 8 hours of your appointment.  2. You may drink clear liquids up to 3 hours before appointment.  Clear liquids include water, black coffee, juice or soda.  No milk or cream please. 3. You may take your regular medications, including pain medications with a sip of water before your appointment.  Diabetics should hold regular insulin (if take separately) and take 1/2 normal NPH dose the morning of the procedure.  Carry some sugar containing  items with you to your appointment. 4. A driver must accompany you and be prepared to drive you home after your procedure. 5. Bring all of your current medications with you. 6. An IV may be inserted and sedation may be given at the discretion of the physician. 7. A blood pressure cuff, EKG and other monitors will often be applied during the procedure.  Some patients may need to have extra oxygen administered for a short period.  8. You will be asked to provide medical information, including your allergies, prior to the procedure.  We must know immediately if you are taking blood thinners (like Coumadin/Warfarin) or if you are allergic to IV iodine contrast (dye).  We must know if you could possible be pregnant.  Possible side effects:   Bleeding from needle site  Infection (rare, may require surgery)  Nerve injury (rare)  Numbness & tingling (temporary)  A brief convulsion or seizure  Light-headedness (temporary)  Pain at injection site (several days)  Decreased blood pressure (temporary)  Weakness in the leg (temporary)   Call if you experience:   New onset weakness or numbness of an extremity below the injection site that last more than 8 hours.  Hives or difficulty breathing ( go to the emergency room)  Inflammation or drainage at the injection site  Any new symptoms which are concerning to you  Please note:  Although the local anesthetic injected can often make your back/ hip/ buttock/ leg feel good for several hours after the injections, the pain will likely return.  It takes 3-7 days for steroids to work in the sacroiliac area.  You may not notice any pain relief for at least that one week.  If effective, we will often do a series of three injections spaced 3-6 weeks apart to maximally decrease your pain.  After the initial series,  we generally will wait some months before a repeat injection of the same type.  If you have any questions, please call [phone] [facility]

## 2016-06-03 NOTE — Patient Instructions (Signed)
Pain Management Discharge Instructions  General Discharge Instructions :  If you need to reach your doctor call: Monday-Friday 8:00 am - 4:00 pm at [phone] or toll free [phone].  After clinic hours [phone] to have operator reach doctor.  Bring all of your medication bottles to all your appointments in the pain clinic.  To cancel or reschedule your appointment with Pain Management please remember to call 24 hours in advance to avoid a fee.  Refer to the educational materials which you have been given on: General Risks, I had my Procedure. Discharge Instructions, Post Sedation.  Post Procedure Instructions:  The drugs you were given will stay in your system until tomorrow, so for the next 24 hours you should not drive, make any legal decisions or drink any alcoholic beverages.  You may eat anything you prefer, but it is better to start with liquids then soups and crackers, and gradually work up to solid foods.  Please notify your doctor immediately if you have any unusual bleeding, trouble breathing or pain that is not related to your normal pain.  Depending on the type of procedure that was done, some parts of your body may feel week and/or numb.  This usually clears up by tonight or the next day.  Walk with the use of an assistive device or accompanied by an adult for the 24 hours.  You may use ice on the affected area for the first 24 hours.  Put ice in a Ziploc bag and cover with a towel and place against area 15 minutes on 15 minutes off.  You may switch to heat after 24 hours.Sacroiliac (SI) Joint Injection Patient Information  Description: The sacroiliac joint connects the scrum (very low back and tailbone) to the ilium (a pelvic bone which also forms half of the hip joint).  Normally this joint experiences very little motion.  When this joint becomes inflamed or unstable low back and or hip and pelvis pain may result.  Injection of this joint with local anesthetics  (numbing medicines) and steroids can provide diagnostic information and reduce pain.  This injection is performed with the aid of x-ray guidance into the tailbone area while you are lying on your stomach.   You may experience an electrical sensation down the leg while this is being done.  You may also experience numbness.  We also may ask if we are reproducing your normal pain during the injection.  Conditions which may be treated SI injection:   Low back, buttock, hip or leg pain  Preparation for the Injection:  1. Do not eat any solid food or dairy products within 8 hours of your appointment.  2. You may drink clear liquids up to 3 hours before appointment.  Clear liquids include water, black coffee, juice or soda.  No milk or cream please. 3. You may take your regular medications, including pain medications with a sip of water before your appointment.  Diabetics should hold regular insulin (if take separately) and take 1/2 normal NPH dose the morning of the procedure.  Carry some sugar containing items with you to your appointment. 4. A driver must accompany you and be prepared to drive you home after your procedure. 5. Bring all of your current medications with you. 6. An IV may be inserted and sedation may be given at the discretion of the physician. 7. A blood pressure cuff, EKG and other monitors will often be applied during the procedure.  Some patients may need to have extra oxygen   administered for a short period.  8. You will be asked to provide medical information, including your allergies, prior to the procedure.  We must know immediately if you are taking blood thinners (like Coumadin/Warfarin) or if you are allergic to IV iodine contrast (dye).  We must know if you could possible be pregnant.  Possible side effects:   Bleeding from needle site  Infection (rare, may require surgery)  Nerve injury (rare)  Numbness & tingling (temporary)  A brief convulsion or  seizure  Light-headedness (temporary)  Pain at injection site (several days)  Decreased blood pressure (temporary)  Weakness in the leg (temporary)   Call if you experience:   New onset weakness or numbness of an extremity below the injection site that last more than 8 hours.  Hives or difficulty breathing ( go to the emergency room)  Inflammation or drainage at the injection site  Any new symptoms which are concerning to you  Please note:  Although the local anesthetic injected can often make your back/ hip/ buttock/ leg feel good for several hours after the injections, the pain will likely return.  It takes 3-7 days for steroids to work in the sacroiliac area.  You may not notice any pain relief for at least that one week.  If effective, we will often do a series of three injections spaced 3-6 weeks apart to maximally decrease your pain.  After the initial series, we generally will wait some months before a repeat injection of the same type.  If you have any questions, please call [phone] [facility]   

## 2016-06-04 ENCOUNTER — Telehealth: Payer: Self-pay | Admitting: *Deleted

## 2016-06-04 NOTE — Telephone Encounter (Signed)
No problems post procedure. 

## 2016-06-10 ENCOUNTER — Encounter: Payer: Medicare PPO | Admitting: Pain Medicine

## 2016-06-13 ENCOUNTER — Ambulatory Visit: Payer: Medicare PPO | Admitting: Pain Medicine

## 2016-06-18 ENCOUNTER — Encounter: Payer: Self-pay | Admitting: Pain Medicine

## 2016-06-18 ENCOUNTER — Ambulatory Visit: Payer: Medicare PPO | Attending: Pain Medicine | Admitting: Pain Medicine

## 2016-06-18 VITALS — BP 110/75 | HR 98 | Temp 98.0°F | Resp 16 | Ht 60.5 in | Wt 140.0 lb

## 2016-06-18 DIAGNOSIS — K219 Gastro-esophageal reflux disease without esophagitis: Secondary | ICD-10-CM | POA: Diagnosis not present

## 2016-06-18 DIAGNOSIS — Z823 Family history of stroke: Secondary | ICD-10-CM | POA: Insufficient documentation

## 2016-06-18 DIAGNOSIS — Z90710 Acquired absence of both cervix and uterus: Secondary | ICD-10-CM | POA: Diagnosis not present

## 2016-06-18 DIAGNOSIS — Z9049 Acquired absence of other specified parts of digestive tract: Secondary | ICD-10-CM | POA: Insufficient documentation

## 2016-06-18 DIAGNOSIS — M797 Fibromyalgia: Secondary | ICD-10-CM | POA: Diagnosis not present

## 2016-06-18 DIAGNOSIS — M792 Neuralgia and neuritis, unspecified: Secondary | ICD-10-CM | POA: Diagnosis not present

## 2016-06-18 DIAGNOSIS — I10 Essential (primary) hypertension: Secondary | ICD-10-CM | POA: Insufficient documentation

## 2016-06-18 DIAGNOSIS — Z87891 Personal history of nicotine dependence: Secondary | ICD-10-CM | POA: Diagnosis not present

## 2016-06-18 DIAGNOSIS — E785 Hyperlipidemia, unspecified: Secondary | ICD-10-CM | POA: Diagnosis not present

## 2016-06-18 DIAGNOSIS — G43909 Migraine, unspecified, not intractable, without status migrainosus: Secondary | ICD-10-CM | POA: Insufficient documentation

## 2016-06-18 DIAGNOSIS — Z833 Family history of diabetes mellitus: Secondary | ICD-10-CM | POA: Diagnosis not present

## 2016-06-18 DIAGNOSIS — M5416 Radiculopathy, lumbar region: Secondary | ICD-10-CM | POA: Diagnosis not present

## 2016-06-18 DIAGNOSIS — G894 Chronic pain syndrome: Secondary | ICD-10-CM | POA: Diagnosis present

## 2016-06-18 DIAGNOSIS — Z8249 Family history of ischemic heart disease and other diseases of the circulatory system: Secondary | ICD-10-CM | POA: Diagnosis not present

## 2016-06-18 DIAGNOSIS — M545 Low back pain, unspecified: Secondary | ICD-10-CM

## 2016-06-18 DIAGNOSIS — F1190 Opioid use, unspecified, uncomplicated: Secondary | ICD-10-CM

## 2016-06-18 DIAGNOSIS — Z79891 Long term (current) use of opiate analgesic: Secondary | ICD-10-CM | POA: Diagnosis not present

## 2016-06-18 DIAGNOSIS — Z981 Arthrodesis status: Secondary | ICD-10-CM | POA: Insufficient documentation

## 2016-06-18 DIAGNOSIS — I2510 Atherosclerotic heart disease of native coronary artery without angina pectoris: Secondary | ICD-10-CM | POA: Insufficient documentation

## 2016-06-18 DIAGNOSIS — M1612 Unilateral primary osteoarthritis, left hip: Secondary | ICD-10-CM | POA: Diagnosis not present

## 2016-06-18 DIAGNOSIS — G8929 Other chronic pain: Secondary | ICD-10-CM

## 2016-06-18 MED ORDER — OXYCODONE-ACETAMINOPHEN 7.5-325 MG PO TABS: 1.0000 | ORAL_TABLET | Freq: Every day | ORAL | 0 refills | Status: DC | PRN

## 2016-06-18 MED ORDER — GABAPENTIN 300 MG PO CAPS: 300.0000 mg | ORAL_CAPSULE | Freq: Two times a day (BID) | ORAL | 0 refills | Status: DC

## 2016-06-18 NOTE — Progress Notes (Signed)
Nursing Pain Medication Assessment:  Safety precautions to be maintained throughout the outpatient stay will include: orient to surroundings, keep bed in low position, maintain call bell within reach at all times, provide assistance with transfer out of bed and ambulation.  Medication Inspection Compliance: Pill count conducted under aseptic conditions, in front of the patient. Neither the pills nor the bottle was removed from the patient's sight at any time. Once count was completed pills were immediately returned to the patient in their original bottle.  Medication: See above Pill Count: 36 of 150 pills remain Bottle Appearance: Standard pharmacy container. Clearly labeled. Filled Date: 37 / 01 / 2017 Medication last intake:06/18/16 @ 1030

## 2016-06-18 NOTE — Addendum Note (Signed)
Addended by: Milinda Pointer A on: 06/18/2016 04:07 PM   Modules accepted: Orders

## 2016-06-18 NOTE — Progress Notes (Signed)
Patient's Name: Jacqueline Thornton  MRN: [id number]  Referring Provider: Lavera Guise, MD  DOB: [birth year]  PCP: Lavera Guise, MD  DOS: 06/18/2016  Note by: Kathlen Brunswick. Dossie Arbour, MD  Service setting: Ambulatory outpatient  Specialty: Interventional Pain Management  Location: [facility] (AMB) Pain Management Facility    Patient type: Established   Primary Reason(s) for Visit: Encounter for prescription drug management & post-procedure evaluation of chronic illness with mild to moderate exacerbation(Level of risk: moderate) CC: Back Pain (lower, left is worse)  HPI  Jacqueline Thornton is a 65 y.o. year old, female patient, who comes today for a post-procedure evaluation and medication management. She has Chronic pain syndrome; Absolute anemia; Gout; HLD (hyperlipidemia); Headache, migraine; Panic attack; Chronic low back pain (Location of Secondary source of pain) (Bilateral) (midline to tailbone) (R>L); Lumbar spondylosis;  Lumbar annular disc tear (L4-5); Discogenic low back pain (L3-4 and L4-5); Lumbar facet hypertrophy; Lumbar facet syndrome (Bilateral) (R>L); Chronic neck pain; Cervical spondylosis; Hx of cervical spine surgery; Cervical spinal fusion (C6-7 interbody fusion); Coronary atherosclerosis; Long term current use of opiate analgesic; Encounter for therapeutic drug level monitoring; Uncomplicated opioid dependence ([place]); Opiate use (56.25 MME/Day); CAD in native artery; Anxiety, generalized; 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 Her primarily concern today is the Back Pain (lower, left is worse)  Pain Assessment: Self-Reported Pain Score: 6 /10             Reported level is compatible with  observation.       Pain Type: Chronic pain Pain Location: Back Pain Orientation: Lower, Left, Right Pain Descriptors / Indicators: Stabbing, Burning, Constant Pain Frequency: Constant  Jacqueline Thornton was last seen on 06/03/2016 for a procedure. During today's appointment we reviewed Jacqueline Thornton's post-procedure results, as well as her outpatient medication regimen.  Further details on both, my assessment(s), as well as the proposed treatment plan, please see below.  Controlled Substance Pharmacotherapy Assessment REMS (Risk Evaluation and Mitigation Strategy)  Analgesic:Oxycodone 7.5/325 one tablet 5 times a day. (56.25 mg/day) MME/day:56.25 mg/day Janett Billow, RN  06/18/2016  1:19 PM  Sign at close encounter Nursing Pain Medication Assessment:  Safety precautions to be maintained throughout the outpatient stay will include: orient to surroundings, keep bed in low position, maintain call bell within reach at all times, provide assistance with transfer out of bed and ambulation.  Medication Inspection Compliance: Pill count conducted under aseptic conditions, in front of the patient. Neither the pills nor the bottle was removed from the patient's sight at any time. Once count was completed pills were immediately returned to the patient in their original bottle.  Medication: See above Pill Count: 36 of 150 pills remain Bottle Appearance: Standard pharmacy container. Clearly labeled. Filled Date: 20 / 01 / 2017 Medication last intake:06/18/16 @ 1030   Pharmacokinetics: Liberation and absorption (onset of action): WNL Distribution (time to peak effect): WNL Metabolism and excretion (duration of action): WNL         Pharmacodynamics: Desired effects: Analgesia: Jacqueline Thornton reports >50% benefit. Functional ability: Patient reports that medication allows her to accomplish basic ADLs Clinically meaningful improvement in function (CMIF): Sustained CMIF goals met Perceived  effectiveness: Described as relatively effective, allowing for increase in activities of daily  living (ADL) Undesirable effects: Side-effects or Adverse reactions: None reported Monitoring: [place] PMP: Online review of the past 77-monthperiod conducted. Compliant with practice rules and regulations List of all UDS test(s) done:  Lab Results  Component Value Date   TOXASSSELUR FINAL 09/27/2015   TOXASSSELUR FINAL 08/23/2015   TOXASSSELUR FINAL 07/06/2015   Last UDS on record: ToxAssure Select 13  Date Value Ref Range Status  09/27/2015 FINAL  Final    Comment:    ==================================================================== TOXASSURE SELECT 13 (MW) ==================================================================== Test                             Result       Flag       Units Drug Present and Declared for Prescription Verification   Oxycodone                      2337         EXPECTED   ng/mg creat   Oxymorphone                    368          EXPECTED   ng/mg creat   Noroxycodone                   >3802        EXPECTED   ng/mg creat   Noroxymorphone                 372          EXPECTED   ng/mg creat    Sources of oxycodone are scheduled prescription medications.    Oxymorphone, noroxycodone, and noroxymorphone are expected    metabolites of oxycodone. Oxymorphone is also available as a    scheduled prescription medication. ==================================================================== Test                      Result    Flag   Units      Ref Range   Creatinine              263              mg/dL      >=20 ==================================================================== Declared Medications:  The flagging and interpretation on this report are based on the  following declared medications.  Unexpected results may arise from  inaccuracies in the declared medications.  **Note: The testing scope of this panel includes these medications:  Oxycodone (Percocet)   **Note: The testing scope of this panel does not include following  reported medications:  Acetaminophen (Percocet)  Duloxetine (Cymbalta)  Enalapril (Vasotec)  Estradiol (Estrace)  Fenofibrate  Gabapentin  Meloxicam (Mobic)  Methocarbamol (Robaxin)  Naloxone  Omeprazole (Nexium)  Vitamin D ==================================================================== For clinical consultation, please call ([phone] ====================================================================    UDS interpretation: Compliant          Medication Assessment Form: Reviewed. Patient indicates being compliant with therapy Treatment compliance: Compliant Risk Assessment Profile: Aberrant behavior: See prior evaluations. None observed or detected today Comorbid factors increasing risk of overdose: See prior notes. No additional risks detected today Risk of substance use disorder (SUD): Low Opioid Risk Tool (ORT) Total Score: 0  Interpretation Table:  Score <3 = Low Risk for SUD  Score between 4-7 = Moderate Risk for SUD  Score >8 = High Risk for Opioid Abuse   Risk Mitigation Strategies:  Patient Counseling:  Covered Patient-Prescriber Agreement (PPA): Present and active  Notification to other healthcare providers: Done  Pharmacologic Plan: No change in therapy, at this time  Post-Procedure Assessment  06/03/2016 Procedure: Diagnostic left sided Sacroiliac Joint Steroid Injection Post-procedure pain score: 1/10 90% relief of the pain. She indicates that the pain in the sacral region going down into the tailbone is completely gone. However she still has some pain in the left posterior buttocks area and this may be coming from her hip joint. Influential Factors: BMI: 26.89 kg/m Intra-procedural challenges: None observed Assessment challenges: None detected         Post-procedural side-effects, adverse reactions, or complications: None reported Reported issues: None  Sedation: Sedation  provided. When no sedatives are used, the analgesic levels obtained are directly associated to the effectiveness of the local anesthetics. However, when sedation is provided, the level of analgesia obtained during the initial 1 hour following the intervention, is believed to be the result of a combination of factors. These factors may include, but are not limited to: 1. The effectiveness of the local anesthetics used. 2. The effects of the analgesic(s) and/or anxiolytic(s) used. 3. The degree of discomfort experienced by the patient at the time of the procedure. 4. The patients ability and reliability in recalling and recording the events. 5. The presence and influence of possible secondary gains and/or psychosocial factors. Reported result: Relief experienced during the 1st hour after the procedure: 90 % (Ultra-Short Term Relief) Interpretative annotation: Analgesia during this period is likely to be Local Anesthetic and/or IV Sedative (Analgesic/Anxiolitic) related.          Effects of local anesthetic: The analgesic effects attained during this period are directly associated to the localized infiltration of local anesthetics and therefore cary significant diagnostic value as to the etiological location, or anatomical origin, of the pain. Expected duration of relief is directly dependent on the pharmacodynamics of the local anesthetic used. Long-acting (4-6 hours) anesthetics used.  Reported result: Relief during the next 4 to 6 hour after the procedure: 80 % (Short-Term Relief) Interpretative annotation: Complete relief would suggest area to be the source of the pain.          Long-term benefit: Defined as the period of time past the expected duration of local anesthetics. With the possible exception of prolonged sympathetic blockade from the local anesthetics, benefits during this period are typically attributed to, or associated with, other factors such as analgesic sensory neuropraxia,  antiinflammatory effects, or beneficial biochemical changes provided by agents other than the local anesthetics Reported result: Extended relief following procedure: 30 % (pain came back gradually. had to take additional pain medicine for increased pain .) (Long-Term Relief) Interpretative annotation: Good relief. This could suggest inflammation to be a significant component in the etiology to the pain.          Current benefits: Defined as persistent relief that continues at this point in time.   Reported results: Treated area: <25 %       Interpretative annotation: Recurrance of symptoms. This would suggest persistent aggravating factors  Interpretation: Results would suggest Jacqueline Thornton to be a good candidate for a RACZ Procedure. The patient has failed to respond to conservative therapies including over-the-counter medications, anti-inflammatories, muscle relaxants, membrane stabilizers, opioids, physical therapy, modalities such as heat and ice, as well as more invasive techniques such as nerve blocks. Because Jacqueline Thornton did attain more than 50% relief of the pain during a series of diagnostic blocks conducted in separate occasions, I  believe it is medically necessary to proceed with Radiofrequency Ablation, in order to attempt gaining longer relief.  Laboratory Chemistry  Inflammation Markers Lab Results  Component Value Date   ESRSEDRATE 8 08/23/2015   CRP <0.5 08/23/2015   Renal Function Lab Results  Component Value Date   BUN 15 08/23/2015   CREATININE 0.77 08/23/2015   GFRAA >60 08/23/2015   GFRNONAA >60 08/23/2015   Hepatic Function Lab Results  Component Value Date   AST 16 08/23/2015   ALT 13 (L) 08/23/2015   ALBUMIN 3.9 08/23/2015   Electrolytes Lab Results  Component Value Date   NA 140 08/23/2015   K 3.7 08/23/2015   CL 106 08/23/2015   CALCIUM 9.1 08/23/2015   MG 2.2 08/23/2015   Pain Modulating Vitamins Lab Results  Component Value Date   25OHVITD1 28  (L) 12/27/2015   25OHVITD2 <1.0 12/27/2015   25OHVITD3 28 12/27/2015   VITAMINB12 161 (L) 12/27/2015   Coagulation Parameters Lab Results  Component Value Date   INR 1.0 03/02/2014   LABPROT 12.7 03/02/2014   PLT 197 01/26/2012   Cardiovascular Lab Results  Component Value Date   HGB 14.2 01/26/2012   HCT 41.1 01/26/2012   Note: Lab results reviewed.  Recent Diagnostic Imaging Review  Dg C-arm 1-60 Min-no Report  Result Date: 06/03/2016 There is no report for this exam.  Note: Imaging results reviewed.          Meds  The patient has a current medication list which includes the following prescription(s): cholecalciferol, duloxetine, enalapril, esomeprazole, estradiol, gabapentin, methocarbamol, oxycodone-acetaminophen, oxycodone-acetaminophen, oxycodone-acetaminophen, rosuvastatin, and zostavax.  Current Outpatient Prescriptions on File Prior to Visit  Medication Sig  . cholecalciferol (VITAMIN D) 400 UNITS TABS tablet Take 1,000 Units by mouth daily.  . DULoxetine (CYMBALTA) 60 MG capsule Take 60 mg by mouth daily.   . enalapril (VASOTEC) 5 MG tablet Take 5 mg by mouth daily.  Marland Kitchen esomeprazole (NEXIUM) 20 MG capsule Take 20 mg by mouth daily at 12 noon.  Marland Kitchen estradiol (ESTRACE) 0.5 MG tablet Take 0.5 mg by mouth daily.   . methocarbamol (ROBAXIN) 750 MG tablet 750 mg 3 (three) times daily.   . rosuvastatin (CRESTOR) 5 MG tablet Take 5 mg by mouth every other day.   Marland Kitchen ZOSTAVAX 40768 UNT/0.65ML injection    No current facility-administered medications on file prior to visit.    ROS  Constitutional: Denies any fever or chills Gastrointestinal: No reported hemesis, hematochezia, vomiting, or acute GI distress Musculoskeletal: Denies any acute onset joint swelling, redness, loss of ROM, or weakness Neurological: No reported episodes of acute onset apraxia, aphasia, dysarthria, agnosia, amnesia, paralysis, loss of coordination, or loss of consciousness  Allergies  Jacqueline Thornton  has No Known Allergies.  [place]  Drug: Jacqueline Thornton  reports that she does not use drugs. Alcohol:  reports that she does not drink alcohol. Tobacco:  reports that she has quit smoking. She has never used smokeless tobacco. Medical:  has a past medical history of Angina pectoris ([employer]); Anxiety; Arthritis; Arthropathy of sacroiliac joint ([place]) (04/11/2015); Asthma; Atypical face pain (04/24/2015); CAD (coronary artery disease); Chronic back pain; Depression; Fibromyalgia; GERD (gastroesophageal reflux disease); 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) (04/11/2015); Hyperlipidemia; Hypertension; Low back pain (04/06/2015); Lumbar radicular pain (04/18/2015); Migraine; Narrowing of intervertebral disc space (04/11/2015); Sacroiliac joint pain (04/11/2015); and Spine disorder. Family: family history includes Diabetes in her father and mother; Hypertension in her sister; Stroke in her mother.  Past Surgical History:  Procedure  Laterality Date  . ABDOMINAL HYSTERECTOMY    . APPENDECTOMY    . CHOLECYSTECTOMY    . COLONOSCOPY WITH PROPOFOL N/A 07/19/2015   Procedure: COLONOSCOPY WITH PROPOFOL;  Surgeon: Manya Silvas, MD;  [facility] ENDOSCOPY;  Service: Endoscopy;  Laterality: N/A;  . NECK SURGERY Bilateral   . NECK SURGERY    . SHOULDER SURGERY     Constitutional Exam  General appearance: Well nourished, well developed, and well hydrated. In no apparent acute distress Vitals:   06/18/16 1309  BP: 110/75  Pulse: 98  Resp: 16  Temp: 98 F (36.7 C)  TempSrc: Oral  SpO2: 100%  Weight: 140 lb (63.5 kg)  Height: 5' 0.5" (1.537 m)   BMI Assessment: Estimated body mass index is 26.89 kg/m as calculated from the following:   Height as of this encounter: 5' 0.5" (1.537 m).   Weight as of this encounter: 140 lb (63.5 kg).  BMI interpretation table: BMI level Category Range association with higher incidence of chronic pain  <18 kg/m2 Underweight   18.5-24.9 kg/m2 Ideal body weight   25-29.9  kg/m2 Overweight Increased incidence by 20%  30-34.9 kg/m2 Obese (Class I) Increased incidence by 68%  35-39.9 kg/m2 Severe obesity (Class II) Increased incidence by 136%  >40 kg/m2 Extreme obesity (Class III) Increased incidence by 254%   BMI Readings from Last 4 Encounters:  06/18/16 26.89 kg/m  06/03/16 27.93 kg/m  03/18/16 27.34 kg/m  02/29/16 27.34 kg/m   Wt Readings from Last 4 Encounters:  06/18/16 140 lb (63.5 kg)  06/03/16 143 lb (64.9 kg)  03/18/16 140 lb (63.5 kg)  02/29/16 140 lb (63.5 kg)  Psych/Mental status: Alert, oriented x 3 (person, place, & time) Eyes: PERLA Respiratory: No evidence of acute respiratory distress  Cervical Spine Exam  Inspection: No masses, redness, or swelling Alignment: Symmetrical Functional ROM: Unrestricted ROM Stability: No instability detected Muscle strength & Tone: Functionally intact Sensory: Unimpaired Palpation: Non-contributory  Upper Extremity (UE) Exam    Side: Right upper extremity  Side: Left upper extremity  Inspection: No masses, redness, swelling, or asymmetry  Inspection: No masses, redness, swelling, or asymmetry  Functional ROM: Unrestricted ROM          Functional ROM: Unrestricted ROM          Muscle strength & Tone: Functionally intact  Muscle strength & Tone: Functionally intact  Sensory: Unimpaired  Sensory: Unimpaired  Palpation: Non-contributory  Palpation: Non-contributory   Thoracic Spine Exam  Inspection: No masses, redness, or swelling Alignment: Symmetrical Functional ROM: Unrestricted ROM Stability: No instability detected Sensory: Unimpaired Muscle strength & Tone: Functionally intact Palpation: Non-contributory  Lumbar Spine Exam  Inspection: No masses, redness, or swelling Alignment: Symmetrical Functional ROM: Unrestricted ROM Stability: No instability detected Muscle strength & Tone: Functionally intact Sensory: Unimpaired Palpation: Non-contributory Provocative Tests: Lumbar  Hyperextension and rotation test: evaluation deferred today       Patrick's Maneuver: evaluation deferred today              Gait & Posture Assessment  Ambulation: Unassisted Gait: Relatively normal for age and body habitus Posture: WNL   Lower Extremity Exam    Side: Right lower extremity  Side: Left lower extremity  Inspection: No masses, redness, swelling, or asymmetry  Inspection: No masses, redness, swelling, or asymmetry  Functional ROM: Unrestricted ROM          Functional ROM: Unrestricted ROM          Muscle strength & Tone: Functionally  intact  Muscle strength & Tone: Functionally intact  Sensory: Unimpaired  Sensory: Unimpaired  Palpation: Non-contributory  Palpation: Non-contributory   Assessment  Primary Diagnosis & Pertinent Problem List: The primary encounter diagnosis was Chronic pain syndrome. Diagnoses of Neurogenic pain, Chronic lumbar radicular pain (Location of Primary Source of Pain) (Left) (S1 Dermatome), Chronic low back pain (Location of Secondary source of pain) (Bilateral) (midline to tailbone) (R>L), Long term current use of opiate analgesic, and Opiate use (56.25 MME/Day) were also pertinent to this visit.  Status Diagnosis   Stable  Stable  Stable 1. Chronic pain syndrome   2. Neurogenic pain   3. Chronic lumbar radicular pain (Location of Primary Source of Pain) (Left) (S1 Dermatome)   4. Chronic low back pain (Location of Secondary source of pain) (Bilateral) (midline to tailbone) (R>L)   5. Long term current use of opiate analgesic   6. Opiate use (56.25 MME/Day)      Plan of Care  Pharmacotherapy (Medications Ordered): Meds ordered this encounter  Medications  . oxyCODONE-acetaminophen (PERCOCET) 7.5-325 MG tablet    Sig: Take 1 tablet by mouth 5 (five) times daily as needed for severe pain.    Dispense:  150 tablet    Refill:  0    Do not place this medication, or any other prescription from our practice, on "Automatic Refill". Patient may  have prescription filled one day early if pharmacy is closed on scheduled refill date. Do not fill until: 06/29/17 To last until: 07/29/16  . oxyCODONE-acetaminophen (PERCOCET) 7.5-325 MG tablet    Sig: Take 1 tablet by mouth 5 (five) times daily as needed for severe pain.    Dispense:  150 tablet    Refill:  0    Do not place this medication, or any other prescription from our practice, on "Automatic Refill". Patient may have prescription filled one day early if pharmacy is closed on scheduled refill date. Do not fill until: 07/29/16 To last until: 08/28/16  . oxyCODONE-acetaminophen (PERCOCET) 7.5-325 MG tablet    Sig: Take 1 tablet by mouth 5 (five) times daily as needed for severe pain.    Dispense:  150 tablet    Refill:  0    Do not place this medication, or any other prescription from our practice, on "Automatic Refill". Patient may have prescription filled one day early if pharmacy is closed on scheduled refill date. Do not fill until: 08/28/16 To last until: 09/27/16  . gabapentin (NEURONTIN) 300 MG capsule    Sig: Take 1 capsule (300 mg total) by mouth 2 (two) times daily.    Dispense:  180 capsule    Refill:  0    Do not add this medication to the electronic "Automatic Refill" notification system. Patient may have prescription filled one day early if pharmacy is closed on scheduled refill date.   New Prescriptions   No medications on file   Medications administered today: Jacqueline Thornton had no medications administered during this visit. Lab-work, procedure(s), and/or referral(s): No orders of the defined types were placed in this encounter.  Imaging and/or referral(s): None  Interventional therapies: Planned, scheduled, and/or pending:   None at this time. The patient indicates that in January she will be changing to a different insurance company.    Considering:   Right sided lumbar facet radiofrequency ablation + Left S-I Block, under fluoroscopic guidance and IV  sedation.  Diagnostic cervical epidural steroid injection under fluoroscopic guidance, with a without sedation.  Diagnostic bilateral cervical facet  block under fluoroscopic guidance and IV sedation.  Possible bilateral cervical facet radiofrequency ablation under fluoroscopic guidance and IV sedation.  Diagnostic bilateral lumbar facet block under fluoroscopic guidance and IV sedation.  Possible bilateral lumbar facet radiofrequency ablation under fluoroscopic guidance and IV sedation.  Diagnostic left L4-5 lumbar epidural steroid injection under fluoroscopic guidance, with a without IV sedation.  Diagnostic left caudal epidural steroid injection under fluoroscopic guidance, with a without sedation.  Diagnostic left S1 selective nerve root block under fluoroscopic guidance, with a without sedation.  Diagnostic bilateral sacroiliac joint block under fluoroscopic guidance, with a without sedation.  Possible bilateral sacroiliac joint radiofrequency ablation under fluoroscopic guidance and IV sedation.  Diagnostic right trochanteric bursa injection under fluoroscopic guidance, no sedation.    Palliative PRN treatment(s):   Diagnostic cervical epidural steroid injection under fluoroscopic guidance, with a without sedation.  Diagnostic bilateral cervical facet block under fluoroscopic guidance and IV sedation.  Diagnostic bilateral lumbar facet block under fluoroscopic guidance and IV sedation.  Diagnostic left L4-5 lumbar epidural steroid injection under fluoroscopic guidance, with a without IV sedation.  Diagnostic left caudal epidural steroid injection under fluoroscopic guidance, with a without sedation.  Diagnostic left S1 selective nerve root block under fluoroscopic guidance, with a without sedation.  Diagnostic bilateral sacroiliac joint block under fluoroscopic guidance, with a without sedation.  Diagnostic right trochanteric bursa injection under fluoroscopic guidance, no sedation.     Provider-requested follow-up: Return in about 3 months (around 09/16/2016) for Med-Mgmt, procedure: Lumbar Facet & SI RFA..  Future Appointments Date Time Provider Bent  07/17/2016 1:00 PM Milinda Pointer, MD [facility] None  09/12/2016 8:30 AM Milinda Pointer, MD [facility] None   Primary Care Physician: Lavera Guise, MD Location: [facility] Outpatient Pain Management Facility Note by: Kathlen Brunswick. Dossie Arbour, M.D, DABA, DABAPM, DABPM, DABIPP, FIPP Date: 06/18/16; Time: 2:30 PM  Pain Score Disclaimer: We use the NRS-11 scale. This is a self-reported, subjective measurement of pain severity with only modest accuracy. It is used primarily to identify changes within a particular patient. It must be understood that outpatient pain scales are significantly less accurate that those used for research, where they can be applied under ideal controlled circumstances with minimal exposure to variables. In reality, the score is likely to be a combination of pain intensity and pain affect, where pain affect describes the degree of emotional arousal or changes in action readiness caused by the sensory experience of pain. Factors such as social and work situation, setting, emotional state, anxiety levels, expectation, and prior pain experience may influence pain perception and show large inter-individual differences that may also be affected by time variables.  Patient instructions provided during this appointment: There are no Patient Instructions on file for this visit.

## 2016-07-16 ENCOUNTER — Telehealth: Payer: Self-pay

## 2016-07-16 NOTE — Telephone Encounter (Signed)
Attempted to call patient, message left. Dr. Dossie Arbour needs to know details about patient's pain. Patient's phone number is [phone]

## 2016-07-16 NOTE — Telephone Encounter (Signed)
Patient would like to schedule a procedure. She has new insurance so it would have to go through [place] before scheduling. So once Dr. Dossie Arbour gives the ok for the procedure, please forward to Angie. The patient is coming today to bring her new insurance card for [place] to copy. Thanks

## 2016-07-17 ENCOUNTER — Ambulatory Visit: Payer: Medicare PPO | Admitting: Pain Medicine

## 2016-07-22 NOTE — Telephone Encounter (Signed)
Patient called with further details re; pain.  States the is in her lower back bilaterally but is more severe on the left.  Pain is going into hips and upper legs.  After further discussion the procedure that she thinks helps the most is the lumbar facet.  Denies any benefits from having  SI joint injections. Message sent to Angie for PA

## 2016-07-23 NOTE — Telephone Encounter (Signed)
Request for authorization to [facility]  Pending review

## 2016-08-21 ENCOUNTER — Ambulatory Visit (HOSPITAL_BASED_OUTPATIENT_CLINIC_OR_DEPARTMENT_OTHER): Payer: Medicare HMO | Admitting: Pain Medicine

## 2016-08-21 ENCOUNTER — Encounter: Payer: Self-pay | Admitting: Pain Medicine

## 2016-08-21 ENCOUNTER — Ambulatory Visit
Admission: RE | Admit: 2016-08-21 | Discharge: 2016-08-21 | Disposition: A | Discharge status: Home / Self Care | Payer: Medicare HMO | Source: Ambulatory Visit | Attending: Pain Medicine | Admitting: Pain Medicine

## 2016-08-21 VITALS — BP 118/75 | HR 68 | Temp 98.0°F | Resp 16 | Ht 60.0 in | Wt 140.0 lb

## 2016-08-21 DIAGNOSIS — G8929 Other chronic pain: Secondary | ICD-10-CM | POA: Diagnosis present

## 2016-08-21 DIAGNOSIS — M47816 Spondylosis without myelopathy or radiculopathy, lumbar region: Secondary | ICD-10-CM | POA: Insufficient documentation

## 2016-08-21 DIAGNOSIS — Z9049 Acquired absence of other specified parts of digestive tract: Secondary | ICD-10-CM | POA: Insufficient documentation

## 2016-08-21 DIAGNOSIS — M961 Postlaminectomy syndrome, not elsewhere classified: Secondary | ICD-10-CM | POA: Insufficient documentation

## 2016-08-21 DIAGNOSIS — M545 Low back pain, unspecified: Secondary | ICD-10-CM

## 2016-08-21 DIAGNOSIS — M1288 Other specific arthropathies, not elsewhere classified, other specified site: Secondary | ICD-10-CM | POA: Diagnosis not present

## 2016-08-21 DIAGNOSIS — M47896 Other spondylosis, lumbar region: Secondary | ICD-10-CM

## 2016-08-21 DIAGNOSIS — Z90710 Acquired absence of both cervix and uterus: Secondary | ICD-10-CM | POA: Insufficient documentation

## 2016-08-21 MED ORDER — ROPIVACAINE HCL 2 MG/ML IJ SOLN
INTRAMUSCULAR | Status: AC
  Filled 2016-08-21: qty 20

## 2016-08-21 MED ORDER — FENTANYL CITRATE (PF) 100 MCG/2ML IJ SOLN
INTRAMUSCULAR | Status: AC
  Filled 2016-08-21: qty 2

## 2016-08-21 MED ORDER — LACTATED RINGERS IV SOLN: 1000.0000 mL | Freq: Once | INTRAVENOUS | Status: DC

## 2016-08-21 MED ORDER — TRIAMCINOLONE ACETONIDE 40 MG/ML IJ SUSP
40.0000 mg | Freq: Once | INTRAMUSCULAR | Status: AC
  Administered 2016-08-21: 40 mg

## 2016-08-21 MED ORDER — LIDOCAINE HCL (PF) 1 % IJ SOLN
10.0000 mL | Freq: Once | INTRAMUSCULAR | Status: DC
Start: 2016-08-21 — End: 2016-08-21

## 2016-08-21 MED ORDER — FENTANYL CITRATE (PF) 100 MCG/2ML IJ SOLN
25.0000 ug | INTRAMUSCULAR | Status: DC | PRN
  Administered 2016-08-21: 100 ug via INTRAVENOUS

## 2016-08-21 MED ORDER — ROPIVACAINE HCL 5 MG/ML IJ SOLN
5.0000 mL | Freq: Once | INTRAMUSCULAR | Status: AC
  Administered 2016-08-21: 5 mL via EPIDURAL

## 2016-08-21 MED ORDER — MIDAZOLAM HCL 5 MG/5ML IJ SOLN
INTRAMUSCULAR | Status: AC
  Filled 2016-08-21: qty 5

## 2016-08-21 MED ORDER — TRIAMCINOLONE ACETONIDE 40 MG/ML IJ SUSP
INTRAMUSCULAR | Status: AC
  Filled 2016-08-21: qty 2

## 2016-08-21 MED ORDER — ROPIVACAINE HCL 5 MG/ML IJ SOLN: 5.0000 mL | Freq: Once | INTRAMUSCULAR | Status: DC

## 2016-08-21 MED ORDER — MIDAZOLAM HCL 5 MG/5ML IJ SOLN
1.0000 mg | INTRAMUSCULAR | Status: DC | PRN
  Administered 2016-08-21: 3 mg via INTRAVENOUS

## 2016-08-21 NOTE — Progress Notes (Signed)
Safety precautions to be maintained throughout the outpatient stay will include: orient to surroundings, keep bed in low position, maintain call bell within reach at all times, provide assistance with transfer out of bed and ambulation.  

## 2016-08-21 NOTE — Progress Notes (Signed)
Patient's Name: Jacqueline Thornton  MRN: [id number]  Referring Provider: Lavera Guise, MD  DOB: Jul 27, 1950  PCP: Lavera Guise, MD  DOS: 08/21/2016  Note by: Kathlen Brunswick. Dossie Arbour, MD  Service setting: Ambulatory outpatient  Location: [facility] (AMB) Pain Management Facility  Visit type: Procedure  Specialty: Interventional Pain Management  Patient type: Established   Primary Reason for Visit: Interventional Pain Management Treatment. CC: Back Pain (lower, right and left)  Procedure:  Anesthesia, Analgesia, Anxiolysis:  Type: Diagnostic Medial Branch Facet Block Region: Lumbar Level: L2, L3, L4, L5, & S1 Medial Branch Level(s) Laterality: Bilateral  Type: Local Anesthesia with Moderate (Conscious) Sedation Local Anesthetic: Lidocaine 1% Route: Intravenous (IV) IV Access: Secured Sedation: Meaningful verbal contact was maintained at all times during the procedure  Indication(s): Analgesia and Anxiety  Indications: 1. Lumbar facet syndrome (Bilateral) (R>L)   2. Lumbar facet hypertrophy   3. Lumbar spondylosis   4. Chronic low back pain (Location of Secondary source of pain) (Bilateral) (midline to tailbone) (R>L)    Pain Score: Pre-procedure: 5 /10 Post-procedure: 1 /10  Pre-op Assessment:  Previous date of service: 06/18/16 Service provided: Med Refill Jacqueline Thornton is a 66 y.o. (year old), female patient, seen today for interventional treatment. She  has a past surgical history that includes Cholecystectomy; Abdominal hysterectomy; Neck surgery (Bilateral); Appendectomy; Neck surgery; Shoulder surgery; and Colonoscopy with propofol (N/A, 07/19/2015). Her primarily concern today is the Back Pain (lower, right and left)  Initial Vital Signs: Blood pressure (!) 124/59, pulse 73, temperature 98.3 F (36.8 C), temperature source Oral, resp. rate 16, height 5' (1.524 m), weight 140 lb (63.5 kg), SpO2 99 %. BMI: 27.34 kg/m  Risk Assessment: Allergies: Reviewed. She has No Known Allergies.    Allergy Precautions: None required Coagulopathies: "Reviewed. None identified.  Blood-thinner therapy: None at this time Active Infection(s): Reviewed. None identified. Jacqueline Thornton is afebrile  Site Confirmation: Jacqueline Thornton was asked to confirm the procedure and laterality before marking the site Procedure checklist: Completed Consent: Before the procedure and under the influence of no sedative(s), amnesic(s), or anxiolytics, the patient was informed of the treatment options, risks and possible complications. To fulfill our ethical and legal obligations, as recommended by the American Medical Association's Code of Ethics, I have informed the patient of my clinical impression; the nature and purpose of the treatment or procedure; the risks, benefits, and possible complications of the intervention; the alternatives, including doing nothing; the risk(s) and benefit(s) of the alternative treatment(s) or procedure(s); and the risk(s) and benefit(s) of doing nothing. The patient was provided information about the general risks and possible complications associated with the procedure. These may include, but are not limited to: failure to achieve desired goals, infection, bleeding, organ or nerve damage, allergic reactions, paralysis, and death. In addition, the patient was informed of those risks and complications associated to Spine-related procedures, such as failure to decrease pain; infection (i.e.: Meningitis, epidural or intraspinal abscess); bleeding (i.e.: epidural hematoma, subarachnoid hemorrhage, or any other type of intraspinal or peri-dural bleeding); organ or nerve damage (i.e.: Any type of peripheral nerve, nerve root, or spinal cord injury) with subsequent damage to sensory, motor, and/or autonomic systems, resulting in permanent pain, numbness, and/or weakness of one or several areas of the body; allergic reactions; (i.e.: anaphylactic reaction); and/or death. Furthermore, the patient was  informed of those risks and complications associated with the medications. These include, but are not limited to: allergic reactions (i.e.: anaphylactic or anaphylactoid reaction(s)); adrenal axis suppression;  blood sugar elevation that in diabetics may result in ketoacidosis or comma; water retention that in patients with history of congestive heart failure may result in shortness of breath, pulmonary edema, and decompensation with resultant heart failure; weight gain; swelling or edema; medication-induced neural toxicity; particulate matter embolism and blood vessel occlusion with resultant organ, and/or nervous system infarction; and/or aseptic necrosis of one or more joints. Finally, the patient was informed that Medicine is not an exact science; therefore, there is also the possibility of unforeseen or unpredictable risks and/or possible complications that may result in a catastrophic outcome. The patient indicated having understood very clearly. We have given the patient no guarantees and we have made no promises. Enough time was given to the patient to ask questions, all of which were answered to the patient's satisfaction. Jacqueline Thornton has indicated that she wanted to continue with the procedure. Attestation: I, the ordering provider, attest that I have discussed with the patient the benefits, risks, side-effects, alternatives, likelihood of achieving goals, and potential problems during recovery for the procedure that I have provided informed consent. Date: 08/21/2016; Time: 9:58 AM  Pre-Procedure Preparation:  Monitoring: As per clinic protocol. Respiration, ETCO2, SpO2, BP, heart rate and rhythm monitor placed and checked for adequate function Safety Precautions: Patient was assessed for positional comfort and pressure points before starting the procedure. Time-out: I initiated and conducted the "Time-out" before starting the procedure, as per protocol. The patient was asked to participate by  confirming the accuracy of the "Time Out" information. Verification of the correct person, site, and procedure were performed and confirmed by me, the nursing staff, and the patient. "Time-out" conducted as per Joint Commission's Universal Protocol (UP.01.01.01). "Time-out" Date & Time: 08/21/2016; 1032 hrs.  Description of Procedure Process:   Position: Prone Target Area: For Lumbar Facet blocks, the target is the groove formed by the junction of the transverse process and superior articular process. For the L5 dorsal ramus, the target is the notch between superior articular process and sacral ala. For the S1 dorsal ramus, the target is the superior and lateral edge of the posterior S1 Sacral foramen. Approach: Paramedial approach. Area Prepped: Entire Posterior Lumbosacral Region Prepping solution: ChloraPrep (2% chlorhexidine gluconate and 70% isopropyl alcohol) Safety Precautions: Aspiration looking for blood return was conducted prior to all injections. At no point did we inject any substances, as a needle was being advanced. No attempts were made at seeking any paresthesias. Safe injection practices and needle disposal techniques used. Medications properly checked for expiration dates. SDV (single dose vial) medications used. Description of the Procedure: Protocol guidelines were followed. The patient was placed in position over the fluoroscopy table. The target area was identified and the area prepped in the usual manner. Skin desensitized using vapocoolant spray. Skin & deeper tissues infiltrated with local anesthetic. Appropriate amount of time allowed to pass for local anesthetics to take effect. The procedure needle was introduced through the skin, ipsilateral to the reported pain, and advanced to the target area. Employing the "Medial Branch Technique", the needles were advanced to the angle made by the superior and medial portion of the transverse process, and the lateral and inferior portion  of the superior articulating process of the targeted vertebral bodies. This area is known as "Burton's Eye" or the "Eye of the [place] Dog". A procedure needle was introduced through the skin, and this time advanced to the angle made by the superior and medial border of the sacral ala, and the lateral border of  the S1 vertebral body. This last needle was later repositioned at the superior and lateral border of the posterior S1 foramen. Negative aspiration confirmed. Solution injected in intermittent fashion, asking for systemic symptoms every 0.5cc of injectate. The needles were then removed and the area cleansed, making sure to leave some of the prepping solution back to take advantage of its long term bactericidal properties. Vitals:   08/21/16 1043 08/21/16 1053 08/21/16 1102 08/21/16 1113  BP: 122/75 (!) 138/56 93/75 118/75  Pulse: 68     Resp: 11 19 18 16   Temp:    98 F (36.7 C)  TempSrc:      SpO2: 100% 100% 100% 100%  Weight:      Height:        Start Time: 1034 hrs. End Time: 1042 hrs.  Illustration of the posterior view of the lumbar spine and the posterior neural structures. Laminae of L2 through S1 are labeled. DPRL5, dorsal primary ramus of L5; DPRS1, dorsal primary ramus of S1; DPR3, dorsal primary ramus of L3; FJ, facet (zygapophyseal) joint L3-L4; I, inferior articular process of L4; LB1, lateral branch of dorsal primary ramus of L1; IAB, inferior articular branches from L3 medial branch (supplies L4-L5 facet joint); IBP, intermediate branch plexus; MB3, medial branch of dorsal primary ramus of L3; NR3, third lumbar nerve root; S, superior articular process of L5; SAB, superior articular branches from L4 (supplies L4-5 facet joint also); TP3, transverse process of L3.  Materials:  Needle(s) Type: Regular needle Gauge: 22G Length: 3.5-in Medication(s): We administered fentaNYL, midazolam, triamcinolone acetonide, triamcinolone acetonide, and ropivacaine (PF) 5 mg/mL (0.5%).  Please see chart orders for dosing details.  Imaging Guidance (Spinal):  Type of Imaging Technique: Fluoroscopy Guidance (Spinal) Indication(s): Assistance in needle guidance and placement for procedures requiring needle placement in or near specific anatomical locations not easily accessible without such assistance. Exposure Time: Please see nurses notes. Contrast: None used. Fluoroscopic Guidance: I was personally present during the use of fluoroscopy. "Tunnel Vision Technique" used to obtain the best possible view of the target area. Parallax error corrected before commencing the procedure. "Direction-depth-direction" technique used to introduce the needle under continuous pulsed fluoroscopy. Once target was reached, antero-posterior, oblique, and lateral fluoroscopic projection used confirm needle placement in all planes. Images permanently stored in EMR. Interpretation: No contrast injected. I personally interpreted the imaging intraoperatively. Adequate needle placement confirmed in multiple planes. Permanent images saved into the patient's record.  Antibiotic Prophylaxis:  Indication(s): None identified Antibiotic given: None  Post-operative Assessment:  EBL: None Complications: No immediate post-treatment complications observed by team, or reported by patient. Note: The patient tolerated the entire procedure well. A repeat set of vitals were taken after the procedure and the patient was kept under observation following institutional policy, for this type of procedure. Post-procedural neurological assessment was performed, showing return to baseline, prior to discharge. The patient was provided with post-procedure discharge instructions, including a section on how to identify potential problems. Should any problems arise concerning this procedure, the patient was given instructions to immediately contact us, at any time, without hesitation. In any case, we plan to contact the patient by  telephone for a follow-up status report regarding this interventional procedure. Comments:  No additional relevant information.  Plan of Care  Disposition: Discharge home  Discharge Date & Time: 08/21/2016; 1115 hrs.  Physician-requested Follow-up:  Return in about 2 weeks (around 09/04/2016) for Post-Procedure evaluation, in addition, keep previously scheduled appointment.  Future Appointments Date Time Provider Department  Center  09/12/2016 8:30 AM Milinda Pointer, MD [facility] None   Medications ordered for procedure: Meds ordered this encounter  Medications  . fentaNYL (SUBLIMAZE) injection 25-50 mcg    Make sure Narcan is available in the pyxis when using this medication. In the event of respiratory depression (RR< 8/min): Titrate NARCAN (naloxone) in increments of 0.1 to 0.2 mg IV at 2-3 minute intervals, until desired degree of reversal.  . lactated ringers infusion 1,000 mL  . midazolam (VERSED) 5 MG/5ML injection 1-2 mg    Make sure Flumazenil is available in the pyxis when using this medication. If oversedation occurs, administer 0.2 mg IV over 15 sec. If after 45 sec no response, administer 0.2 mg again over 1 min; may repeat at 1 min intervals; not to exceed 4 doses (1 mg)  . triamcinolone acetonide (KENALOG-40) injection 40 mg  . lidocaine (PF) (XYLOCAINE) 1 % injection 10 mL  . ropivacaine (PF) 5 mg/mL (0.5%) (NAROPIN) injection 5 mL  . triamcinolone acetonide (KENALOG-40) injection 40 mg  . ropivacaine (PF) 5 mg/mL (0.5%) (NAROPIN) injection 5 mL   Medications administered: We administered fentaNYL, midazolam, triamcinolone acetonide, triamcinolone acetonide, and ropivacaine (PF) 5 mg/mL (0.5%).  See the medical record for exact dosing, route, and time of administration.  Lab-work, Procedure(s), & Referral(s) Ordered: Orders Placed This Encounter  Procedures  . LUMBAR FACET(MEDIAL BRANCH NERVE BLOCK) MBNB  . DG C-Arm 1-60 Min-No Report  . Discharge instructions  .  Follow-up  . Informed Consent Details: Transcribe to consent form and obtain patient signature  . Provider attestation of informed consent for procedure/surgical case  . Verify informed consent   Imaging Ordered: Results for orders placed in visit on 06/03/16  DG C-Arm 1-60 Min-No Report   Narrative There is no report for this exam.   New Prescriptions   No medications on file   Primary Care Physician: Lavera Guise, MD Location: [facility] Note by: Kathlen Brunswick. Dossie Arbour, M.D, DABA, DABAPM, DABPM, DABIPP, FIPP Date: 08/21/2016; Time: 5:37 PM  Disclaimer:  Medicine is not an exact science. The only guarantee in medicine is that nothing is guaranteed. It is important to note that the decision to proceed with this intervention was based on the information collected from the patient. The Data and conclusions were drawn from the patient's questionnaire, the interview, and the physical examination. Because the information was provided in large part by the patient, it cannot be guaranteed that it has not been purposely or unconsciously manipulated. Every effort has been made to obtain as much relevant data as possible for this evaluation. It is important to note that the conclusions that lead to this procedure are derived in large part from the available data. Always take into account that the treatment will also be dependent on availability of resources and existing treatment guidelines, considered by other Pain Management Practitioners as being common knowledge and practice, at the time of the intervention. For Medico-Legal purposes, it is also important to point out that variation in procedural techniques and pharmacological choices are the acceptable norm. The indications, contraindications, technique, and results of the above procedure should only be interpreted and judged by a Board-Certified Interventional Pain Specialist with extensive familiarity and expertise in the  same exact procedure and technique. Attempts at providing opinions without similar or greater experience and expertise than that of the treating physician will be considered as inappropriate and unethical, and shall result in a formal complaint to the state medical board and  applicable specialty societies.  Instructions provided at this appointment: Patient Instructions  Pain Management Discharge Instructions  General Discharge Instructions :  If you need to reach your doctor call: Monday-Friday 8:00 am - 4:00 pm at [phone] or toll free [phone].  After clinic hours [phone] to have operator reach doctor.  Bring all of your medication bottles to all your appointments in the pain clinic.  To cancel or reschedule your appointment with Pain Management please remember to call 24 hours in advance to avoid a fee.  Refer to the educational materials which you have been given on: General Risks, I had my Procedure. Discharge Instructions, Post Sedation.  Post Procedure Instructions:  The drugs you were given will stay in your system until tomorrow, so for the next 24 hours you should not drive, make any legal decisions or drink any alcoholic beverages.  You may eat anything you prefer, but it is better to start with liquids then soups and crackers, and gradually work up to solid foods.  Please notify your doctor immediately if you have any unusual bleeding, trouble breathing or pain that is not related to your normal pain.  Depending on the type of procedure that was done, some parts of your body may feel week and/or numb.  This usually clears up by tonight or the next day.  Walk with the use of an assistive device or accompanied by an adult for the 24 hours.  You may use ice on the affected area for the first 24 hours.  Put ice in a Ziploc bag and cover with a towel and place against area 15 minutes on 15 minutes off.  You may switch to heat after 24 hours.Facet Joint Block The  facet joints connect the bones of the spine (vertebrae). They make it possible for you to bend, twist, and make other movements with your spine. They also prevent you from overbending, overtwisting, and making other excessive movements.  A facet joint block is a procedure where a numbing medicine (anesthetic) is injected into a facet joint. Often, a type of anti-inflammatory medicine called a steroid is also injected. A facet joint block may be done for two reasons:   Diagnosis. A facet joint block may be done as a test to see whether neck or back pain is caused by a worn-down or infected facet joint. If the pain gets better after a facet joint block, it means the pain is probably coming from the facet joint. If the pain does not get better, it means the pain is probably not coming from the facet joint.   Therapy. A facet joint block may be done to relieve neck or back pain caused by a facet joint. A facet joint block is only done as a therapy if the pain does not improve with medicine, exercise programs, physical therapy, and other forms of pain management. LET [facility] CARE PROVIDER KNOW ABOUT:   Any allergies you have.   All medicines you are taking, including vitamins, herbs, eyedrops, and over-the-counter medicines and creams.   Previous problems you or members of your family have had with the use of anesthetics.   Any blood disorders you have had.   Other health problems you have. RISKS AND COMPLICATIONS Generally, having a facet joint block is safe. However, as with any procedure, complications can occur. Possible complications associated with having a facet joint block include:   Bleeding.   Injury to a nerve near the injection site.   Pain at the injection  site.   Weakness or numbness in areas controlled by nerves near the injection site.   Infection.   Temporary fluid retention.   Allergic reaction to anesthetics or medicines used during the procedure. BEFORE  THE PROCEDURE   Follow your health care provider's instructions if you are taking dietary supplements or medicines. You may need to stop taking them or reduce your dosage.   Do not take any new dietary supplements or medicines without asking your health care provider first.   Follow your health care provider's instructions about eating and drinking before the procedure. You may need to stop eating and drinking several hours before the procedure.   Arrange to have an adult drive you home after the procedure. PROCEDURE  You may need to remove your clothing and dress in an open-back gown so that your health care provider can access your spine.   The procedure will be done while you are lying on an X-ray table. Most of the time you will be asked to lie on your stomach, but you may be asked to lie in a different position if an injection will be made in your neck.   Special machines will be used to monitor your oxygen levels, heart rate, and blood pressure.   If an injection will be made in your neck, an intravenous (IV) tube will be inserted into one of your veins. Fluids and medicine will flow directly into your body through the IV tube.   The area over the facet joint where the injection will be made will be cleaned with an antiseptic soap. The surrounding skin will be covered with sterile drapes.   An anesthetic will be applied to your skin to make the injection area numb. You may feel a temporary stinging or burning sensation.   A video X-ray machine will be used to locate the joint. A contrast dye may be injected into the facet joint area to help with locating the joint.   When the joint is located, an anesthetic medicine will be injected into the joint through the needle.   Your health care provider will ask you whether you feel pain relief. If you do feel relief, a steroid may be injected to provide pain relief for a longer period of time. If you do not feel relief or feel  only partial relief, additional injections of an anesthetic may be made in other facet joints.   The needle will be removed, the skin will be cleansed, and bandages will be applied.  AFTER THE PROCEDURE   You will be observed for 15-30 minutes before being allowed to go home. Do not drive. Have an adult drive you or take a taxi or public transportation instead.   If you feel pain relief, the pain will return in several hours or days when the anesthetic wears off.   You may feel pain relief 2-14 days after the procedure. The amount of time this relief lasts varies from person to person.   It is normal to feel some tenderness over the injected area(s) for 2 days following the procedure.   If you have diabetes, you may have a temporary increase in blood sugar. This information is not intended to replace advice given to you by your health care provider. Make sure you discuss any questions you have with your health care provider. Document Released: 11/06/2006 Document Revised: 07/08/2014 Document Reviewed: 03/13/2015 Elsevier Interactive Patient Education  2017 [employer].

## 2016-08-21 NOTE — Patient Instructions (Signed)
Pain Management Discharge Instructions  General Discharge Instructions :  If you need to reach your doctor call: Monday-Friday 8:00 am - 4:00 pm at [phone] or toll free [phone].  After clinic hours [phone] to have operator reach doctor.  Bring all of your medication bottles to all your appointments in the pain clinic.  To cancel or reschedule your appointment with Pain Management please remember to call 24 hours in advance to avoid a fee.  Refer to the educational materials which you have been given on: General Risks, I had my Procedure. Discharge Instructions, Post Sedation.  Post Procedure Instructions:  The drugs you were given will stay in your system until tomorrow, so for the next 24 hours you should not drive, make any legal decisions or drink any alcoholic beverages.  You may eat anything you prefer, but it is better to start with liquids then soups and crackers, and gradually work up to solid foods.  Please notify your doctor immediately if you have any unusual bleeding, trouble breathing or pain that is not related to your normal pain.  Depending on the type of procedure that was done, some parts of your body may feel week and/or numb.  This usually clears up by tonight or the next day.  Walk with the use of an assistive device or accompanied by an adult for the 24 hours.  You may use ice on the affected area for the first 24 hours.  Put ice in a Ziploc bag and cover with a towel and place against area 15 minutes on 15 minutes off.  You may switch to heat after 24 hours.Facet Joint Block The facet joints connect the bones of the spine (vertebrae). They make it possible for you to bend, twist, and make other movements with your spine. They also prevent you from overbending, overtwisting, and making other excessive movements.  A facet joint block is a procedure where a numbing medicine (anesthetic) is injected into a facet joint. Often, a type of anti-inflammatory  medicine called a steroid is also injected. A facet joint block may be done for two reasons:   Diagnosis. A facet joint block may be done as a test to see whether neck or back pain is caused by a worn-down or infected facet joint. If the pain gets better after a facet joint block, it means the pain is probably coming from the facet joint. If the pain does not get better, it means the pain is probably not coming from the facet joint.   Therapy. A facet joint block may be done to relieve neck or back pain caused by a facet joint. A facet joint block is only done as a therapy if the pain does not improve with medicine, exercise programs, physical therapy, and other forms of pain management. LET YOUR HEALTH CARE PROVIDER KNOW ABOUT:   Any allergies you have.   All medicines you are taking, including vitamins, herbs, eyedrops, and over-the-counter medicines and creams.   Previous problems you or members of your family have had with the use of anesthetics.   Any blood disorders you have had.   Other health problems you have. RISKS AND COMPLICATIONS Generally, having a facet joint block is safe. However, as with any procedure, complications can occur. Possible complications associated with having a facet joint block include:   Bleeding.   Injury to a nerve near the injection site.   Pain at the injection site.   Weakness or numbness in areas controlled by nerves near   the injection site.   Infection.   Temporary fluid retention.   Allergic reaction to anesthetics or medicines used during the procedure. BEFORE THE PROCEDURE   Follow your health care provider's instructions if you are taking dietary supplements or medicines. You may need to stop taking them or reduce your dosage.   Do not take any new dietary supplements or medicines without asking your health care provider first.   Follow your health care provider's instructions about eating and drinking before the  procedure. You may need to stop eating and drinking several hours before the procedure.   Arrange to have an adult drive you home after the procedure. PROCEDURE  You may need to remove your clothing and dress in an open-back gown so that your health care provider can access your spine.   The procedure will be done while you are lying on an X-ray table. Most of the time you will be asked to lie on your stomach, but you may be asked to lie in a different position if an injection will be made in your neck.   Special machines will be used to monitor your oxygen levels, heart rate, and blood pressure.   If an injection will be made in your neck, an intravenous (IV) tube will be inserted into one of your veins. Fluids and medicine will flow directly into your body through the IV tube.   The area over the facet joint where the injection will be made will be cleaned with an antiseptic soap. The surrounding skin will be covered with sterile drapes.   An anesthetic will be applied to your skin to make the injection area numb. You may feel a temporary stinging or burning sensation.   A video X-ray machine will be used to locate the joint. A contrast dye may be injected into the facet joint area to help with locating the joint.   When the joint is located, an anesthetic medicine will be injected into the joint through the needle.   Your health care provider will ask you whether you feel pain relief. If you do feel relief, a steroid may be injected to provide pain relief for a longer period of time. If you do not feel relief or feel only partial relief, additional injections of an anesthetic may be made in other facet joints.   The needle will be removed, the skin will be cleansed, and bandages will be applied.  AFTER THE PROCEDURE   You will be observed for 15-30 minutes before being allowed to go home. Do not drive. Have an adult drive you or take a taxi or public transportation instead.    If you feel pain relief, the pain will return in several hours or days when the anesthetic wears off.   You may feel pain relief 2-14 days after the procedure. The amount of time this relief lasts varies from person to person.   It is normal to feel some tenderness over the injected area(s) for 2 days following the procedure.   If you have diabetes, you may have a temporary increase in blood sugar. This information is not intended to replace advice given to you by your health care provider. Make sure you discuss any questions you have with your health care provider. Document Released: 11/06/2006 Document Revised: 07/08/2014 Document Reviewed: 03/13/2015 [employer] Interactive Patient Education  2017 [employer]  

## 2016-08-22 ENCOUNTER — Telehealth: Payer: Self-pay | Admitting: *Deleted

## 2016-08-22 NOTE — Telephone Encounter (Signed)
Denies problems post procedure. 

## 2016-09-12 ENCOUNTER — Encounter: Payer: Self-pay | Admitting: Pain Medicine

## 2016-09-12 ENCOUNTER — Ambulatory Visit: Payer: Medicare HMO | Attending: Pain Medicine | Admitting: Pain Medicine

## 2016-09-12 VITALS — BP 123/60 | HR 84 | Temp 97.8°F | Resp 16 | Ht 60.0 in | Wt 139.0 lb

## 2016-09-12 DIAGNOSIS — K219 Gastro-esophageal reflux disease without esophagitis: Secondary | ICD-10-CM | POA: Insufficient documentation

## 2016-09-12 DIAGNOSIS — Z90710 Acquired absence of both cervix and uterus: Secondary | ICD-10-CM | POA: Insufficient documentation

## 2016-09-12 DIAGNOSIS — Z823 Family history of stroke: Secondary | ICD-10-CM | POA: Insufficient documentation

## 2016-09-12 DIAGNOSIS — G894 Chronic pain syndrome: Secondary | ICD-10-CM | POA: Insufficient documentation

## 2016-09-12 DIAGNOSIS — M47816 Spondylosis without myelopathy or radiculopathy, lumbar region: Secondary | ICD-10-CM

## 2016-09-12 DIAGNOSIS — M792 Neuralgia and neuritis, unspecified: Secondary | ICD-10-CM | POA: Diagnosis not present

## 2016-09-12 DIAGNOSIS — M542 Cervicalgia: Secondary | ICD-10-CM | POA: Insufficient documentation

## 2016-09-12 DIAGNOSIS — F329 Major depressive disorder, single episode, unspecified: Secondary | ICD-10-CM | POA: Diagnosis not present

## 2016-09-12 DIAGNOSIS — Z833 Family history of diabetes mellitus: Secondary | ICD-10-CM | POA: Insufficient documentation

## 2016-09-12 DIAGNOSIS — Z8249 Family history of ischemic heart disease and other diseases of the circulatory system: Secondary | ICD-10-CM | POA: Insufficient documentation

## 2016-09-12 DIAGNOSIS — M545 Low back pain, unspecified: Secondary | ICD-10-CM

## 2016-09-12 DIAGNOSIS — M791 Myalgia: Secondary | ICD-10-CM | POA: Diagnosis not present

## 2016-09-12 DIAGNOSIS — Z87891 Personal history of nicotine dependence: Secondary | ICD-10-CM | POA: Insufficient documentation

## 2016-09-12 DIAGNOSIS — Z9049 Acquired absence of other specified parts of digestive tract: Secondary | ICD-10-CM | POA: Diagnosis not present

## 2016-09-12 DIAGNOSIS — G8929 Other chronic pain: Secondary | ICD-10-CM | POA: Diagnosis not present

## 2016-09-12 DIAGNOSIS — M7918 Myalgia, other site: Secondary | ICD-10-CM

## 2016-09-12 DIAGNOSIS — Z79891 Long term (current) use of opiate analgesic: Secondary | ICD-10-CM | POA: Diagnosis not present

## 2016-09-12 DIAGNOSIS — E785 Hyperlipidemia, unspecified: Secondary | ICD-10-CM | POA: Diagnosis not present

## 2016-09-12 DIAGNOSIS — M797 Fibromyalgia: Secondary | ICD-10-CM | POA: Insufficient documentation

## 2016-09-12 DIAGNOSIS — M5416 Radiculopathy, lumbar region: Secondary | ICD-10-CM | POA: Diagnosis not present

## 2016-09-12 DIAGNOSIS — F419 Anxiety disorder, unspecified: Secondary | ICD-10-CM | POA: Insufficient documentation

## 2016-09-12 DIAGNOSIS — I10 Essential (primary) hypertension: Secondary | ICD-10-CM | POA: Insufficient documentation

## 2016-09-12 DIAGNOSIS — I2510 Atherosclerotic heart disease of native coronary artery without angina pectoris: Secondary | ICD-10-CM | POA: Insufficient documentation

## 2016-09-12 DIAGNOSIS — M1610 Unilateral primary osteoarthritis, unspecified hip: Secondary | ICD-10-CM | POA: Insufficient documentation

## 2016-09-12 DIAGNOSIS — M4726 Other spondylosis with radiculopathy, lumbar region: Secondary | ICD-10-CM | POA: Diagnosis not present

## 2016-09-12 DIAGNOSIS — Z981 Arthrodesis status: Secondary | ICD-10-CM | POA: Diagnosis not present

## 2016-09-12 DIAGNOSIS — M533 Sacrococcygeal disorders, not elsewhere classified: Secondary | ICD-10-CM

## 2016-09-12 DIAGNOSIS — F1190 Opioid use, unspecified, uncomplicated: Secondary | ICD-10-CM | POA: Diagnosis not present

## 2016-09-12 DIAGNOSIS — G43909 Migraine, unspecified, not intractable, without status migrainosus: Secondary | ICD-10-CM | POA: Insufficient documentation

## 2016-09-12 MED ORDER — OXYCODONE-ACETAMINOPHEN 7.5-325 MG PO TABS: 1.0000 | ORAL_TABLET | Freq: Every day | ORAL | 0 refills | Status: DC | PRN

## 2016-09-12 MED ORDER — GABAPENTIN 300 MG PO CAPS: 300.0000 mg | ORAL_CAPSULE | Freq: Two times a day (BID) | ORAL | 0 refills | Status: DC

## 2016-09-12 MED ORDER — METHOCARBAMOL 750 MG PO TABS: 750.0000 mg | ORAL_TABLET | Freq: Three times a day (TID) | ORAL | 2 refills | Status: DC

## 2016-09-12 NOTE — Progress Notes (Signed)
Patient's Name: Jacqueline Thornton  MRN: [id number]  Referring Provider: Lavera Guise, MD  DOB: Nov 14, 1950  PCP: Lavera Guise, MD  DOS: 09/12/2016  Note by: Kathlen Brunswick. Dossie Arbour, MD  Service setting: Ambulatory outpatient  Specialty: Interventional Pain Management  Location: [facility] (AMB) Pain Management Facility    Patient type: Established   Primary Reason(s) for Visit: Encounter for prescription drug management & post-procedure evaluation of chronic illness with mild to moderate exacerbation(Level of risk: moderate) CC: Hip Pain (left) and Back Pain (lower)  HPI  Jacqueline Thornton is a 66 y.o. year old, female patient, who comes today for a post-procedure evaluation and medication management. She has Chronic pain syndrome; Absolute anemia; Gout; HLD (hyperlipidemia); Headache, migraine; Panic attack; Chronic low back pain (Location of Secondary source of pain) (Bilateral) (midline to tailbone) (R>L); Lumbar spondylosis;  Lumbar annular disc tear (L4-5); Discogenic low back pain (L3-4 and L4-5); Lumbar facet hypertrophy; Lumbar facet syndrome (Bilateral) (R>L); Chronic neck pain; Cervical spondylosis; Hx of cervical spine surgery; Cervical spinal fusion (C6-7 interbody fusion); Coronary atherosclerosis; Long term current use of opiate analgesic; Encounter for therapeutic drug level monitoring; Uncomplicated opioid dependence ([place]); Opiate use (56.25 MME/Day); CAD in native artery; Anxiety, generalized; 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 Her primarily concern today is the Hip Pain (left) and Back Pain (lower)  Pain Assessment: Self-Reported Pain Score: 3 /10             Reported level is compatible  with observation.       Pain Type: Chronic pain Pain Location: Back Pain Orientation: Lower Pain Descriptors / Indicators:  ("like a knife") Pain Frequency: Constant  Jacqueline Thornton was last seen on 08/21/2016 for a procedure. During today's appointment we reviewed [name]'s post-procedure results, as well as her outpatient medication regimen. The patient indicates that she is doing rather well, but she is having pain on her left SI joint and tailbone area. We will go ahead and schedule her for a caudal epidural steroid injection + diagnostic epidurogram under fluoroscopic guidance and IV sedation to determine if she would be a good candidate for a Racz procedure.  Further details on both, my assessment(s), as well as the proposed treatment plan, please see below.  Controlled Substance Pharmacotherapy Assessment REMS (Risk Evaluation and Mitigation Strategy)  Analgesic:Oxycodone 7.5/325 one tablet 5 times a day. (56.25 mg/day) MME/day:56.25 mg/day  Landis Martins, RN  09/12/2016  8:52 AM  Sign at close encounter Nursing Pain Medication Assessment:  Safety precautions to be maintained throughout the outpatient stay will include: orient to surroundings, keep bed in low position, maintain call bell within reach at all times, provide assistance with transfer out of bed and ambulation.  Medication Inspection Compliance: Pill count conducted under aseptic conditions, in front of the patient. Neither the pills nor the bottle was removed from the patient's sight at any time. Once count was completed pills were immediately returned to the patient in their original bottle.  Medication: Oxycodone/APAP Pill/Patch Count: 68 of 150 pills remain Bottle Appearance: Standard pharmacy container. Clearly labeled. Filled Date: 02/27 / 2018 Last Medication intake:  Today   Pharmacokinetics: Liberation and absorption (onset of action): WNL Distribution (time to peak effect): WNL Metabolism and excretion  (  duration of action): WNL         Pharmacodynamics: Desired effects: Analgesia: [name] reports >50% benefit. Functional ability: Patient reports that medication allows her to accomplish basic ADLs Clinically meaningful improvement in function (CMIF): Sustained CMIF goals met Perceived effectiveness: Described as relatively effective, allowing for increase in activities of daily living (ADL) Undesirable effects: Side-effects or Adverse reactions: None reported Monitoring: [place] PMP: Online review of the past 32-monthperiod conducted. Compliant with practice rules and regulations List of all UDS test(s) done:  Lab Results  Component Value Date   TOXASSSELUR FINAL 09/27/2015   TOXASSSELUR FINAL 08/23/2015   TOXASSSELUR FINAL 07/06/2015   Last UDS on record: ToxAssure Select 13  Date Value Ref Range Status  09/27/2015 FINAL  Final    Comment:    ==================================================================== TOXASSURE SELECT 13 (MW) ==================================================================== Test                             Result       Flag       Units Drug Present and Declared for Prescription Verification   Oxycodone                      2337         EXPECTED   ng/mg creat   Oxymorphone                    368          EXPECTED   ng/mg creat   Noroxycodone                   >3802        EXPECTED   ng/mg creat   Noroxymorphone                 372          EXPECTED   ng/mg creat    Sources of oxycodone are scheduled prescription medications.    Oxymorphone, noroxycodone, and noroxymorphone are expected    metabolites of oxycodone. Oxymorphone is also available as a    scheduled prescription medication. ==================================================================== Test                      Result    Flag   Units      Ref Range   Creatinine              263              mg/dL       >=20 ==================================================================== Declared Medications:  The flagging and interpretation on this report are based on the  following declared medications.  Unexpected results may arise from  inaccuracies in the declared medications.  **Note: The testing scope of this panel includes these medications:  Oxycodone (Percocet)  **Note: The testing scope of this panel does not include following  reported medications:  Acetaminophen (Percocet)  Duloxetine (Cymbalta)  Enalapril (Vasotec)  Estradiol (Estrace)  Fenofibrate  Gabapentin  Meloxicam (Mobic)  Methocarbamol (Robaxin)  Naloxone  Omeprazole (Nexium)  Vitamin D ==================================================================== For clinical consultation, please call [phone] ====================================================================    UDS interpretation: Compliant          Medication Assessment Form: Reviewed. Patient indicates being compliant with therapy Treatment compliance: Compliant Risk Assessment Profile: Aberrant behavior: See prior evaluations. None observed or detected today Comorbid factors increasing risk of overdose: See prior notes.  No additional risks detected today Risk of substance use disorder (SUD): Low Opioid Risk Tool (ORT) Total Score: 1  Interpretation Table:  Score <3 = Low Risk for SUD  Score between 4-7 = Moderate Risk for SUD  Score >8 = High Risk for Opioid Abuse   Risk Mitigation Strategies:  Patient Counseling: Covered Patient-Prescriber Agreement (PPA): Present and active  Notification to other healthcare providers: Done  Pharmacologic Plan: No change in therapy, at this time  Post-Procedure Assessment  08/21/2016 Procedure: Palliative bilateral lumbar facet block under fluoroscopic guidance and IV sedation Post-procedure pain score: 1/10 On the day of the procedure the patient indicated having a pain score of 5/10 which went down to  a 1/10 after the procedure. Influential Factors: BMI: 27.15 kg/m Intra-procedural challenges: None observed Assessment challenges: None detected         Post-procedural side-effects, adverse reactions, or complications: None reported Reported issues: None  Sedation: Sedation provided. When no sedatives are used, the analgesic levels obtained are directly associated to the effectiveness of the local anesthetics. However, when sedation is provided, the level of analgesia obtained during the initial 1 hour following the intervention, is believed to be the result of a combination of factors. These factors may include, but are not limited to: 1. The effectiveness of the local anesthetics used. 2. The effects of the analgesic(s) and/or anxiolytic(s) used. 3. The degree of discomfort experienced by the patient at the time of the procedure. 4. The patients ability and reliability in recalling and recording the events. 5. The presence and influence of possible secondary gains and/or psychosocial factors. Reported result: Relief experienced during the 1st hour after the procedure: 80 % (Ultra-Short Term Relief) Interpretative annotation: Analgesia during this period is likely to be Local Anesthetic and/or IV Sedative (Analgesic/Anxiolitic) related.          Effects of local anesthetic: The analgesic effects attained during this period are directly associated to the localized infiltration of local anesthetics and therefore cary significant diagnostic value as to the etiological location, or anatomical origin, of the pain. Expected duration of relief is directly dependent on the pharmacodynamics of the local anesthetic used. Long-acting (4-6 hours) anesthetics used.  Reported result: Relief during the next 4 to 6 hour after the procedure: 80 % (Short-Term Relief) Interpretative annotation: Complete relief would suggest area to be the source of the pain.          Long-term benefit: Defined as the period of  time past the expected duration of local anesthetics. With the possible exception of prolonged sympathetic blockade from the local anesthetics, benefits during this period are typically attributed to, or associated with, other factors such as analgesic sensory neuropraxia, antiinflammatory effects, or beneficial biochemical changes provided by agents other than the local anesthetics Reported result: Extended relief following procedure: 75 % (Long-Term Relief) Interpretative annotation: Good relief. This could suggest inflammation to be a significant component in the etiology to the pain.          Current benefits: Defined as persistent relief that continues at this point in time.   Reported results: Treated area: 75 % Ms. Raybourn reports improvement in function Interpretative annotation: Ongoing benefits would suggest effective therapeutic approach  Interpretation: Results would suggest a successful diagnostic intervention.          Laboratory Chemistry  Inflammation Markers Lab Results  Component Value Date   CRP <0.5 08/23/2015   ESRSEDRATE 8 08/23/2015   (CRP: Acute Phase) (ESR: Chronic Phase) Renal Function Markers  Lab Results  Component Value Date   BUN 15 08/23/2015   CREATININE 0.77 08/23/2015   GFRAA >60 08/23/2015   GFRNONAA >60 08/23/2015   Hepatic Function Markers Lab Results  Component Value Date   AST 16 08/23/2015   ALT 13 (L) 08/23/2015   ALBUMIN 3.9 08/23/2015   ALKPHOS 48 08/23/2015   Electrolytes Lab Results  Component Value Date   NA 140 08/23/2015   K 3.7 08/23/2015   CL 106 08/23/2015   CALCIUM 9.1 08/23/2015   MG 2.2 08/23/2015   Neuropathy Markers Lab Results  Component Value Date   VITAMINB12 161 (L) 12/27/2015   Bone Pathology Markers Lab Results  Component Value Date   ALKPHOS 48 08/23/2015   25OHVITD1 28 (L) 12/27/2015   25OHVITD2 <1.0 12/27/2015   25OHVITD3 28 12/27/2015   CALCIUM 9.1 08/23/2015   Coagulation Parameters Lab Results   Component Value Date   INR 1.0 03/02/2014   LABPROT 12.7 03/02/2014   PLT 197 01/26/2012   Cardiovascular Markers Lab Results  Component Value Date   HGB 14.2 01/26/2012   HCT 41.1 01/26/2012   Note: Lab results reviewed.  Recent Diagnostic Imaging Review  Dg C-arm 1-60 Min-no Report  Result Date: 08/21/2016 Fluoroscopy was utilized by the requesting physician.  No radiographic interpretation.   Note: Imaging results reviewed.          Meds  The patient has a current medication list which includes the following prescription(s): cholecalciferol, duloxetine, enalapril, esomeprazole, estradiol, gabapentin, methocarbamol, oxycodone-acetaminophen, oxycodone-acetaminophen, oxycodone-acetaminophen, rosuvastatin, and zostavax.  Current Outpatient Prescriptions on File Prior to Visit  Medication Sig  . cholecalciferol (VITAMIN D) 400 UNITS TABS tablet Take 1,000 Units by mouth daily.  . DULoxetine (CYMBALTA) 60 MG capsule Take 60 mg by mouth daily.   . enalapril (VASOTEC) 5 MG tablet Take 5 mg by mouth daily.  Marland Kitchen esomeprazole (NEXIUM) 20 MG capsule Take 20 mg by mouth daily at 12 noon.  Marland Kitchen estradiol (ESTRACE) 0.5 MG tablet Take 0.5 mg by mouth daily.   . rosuvastatin (CRESTOR) 5 MG tablet Take 5 mg by mouth every other day.   Marland Kitchen ZOSTAVAX 09323 UNT/0.65ML injection    No current facility-administered medications on file prior to visit.    ROS  Constitutional: Denies any fever or chills Gastrointestinal: No reported hemesis, hematochezia, vomiting, or acute GI distress Musculoskeletal: Denies any acute onset joint swelling, redness, loss of ROM, or weakness Neurological: No reported episodes of acute onset apraxia, aphasia, dysarthria, agnosia, amnesia, paralysis, loss of coordination, or loss of consciousness  Allergies  Ms. Syler has No Known Allergies.  [place]  Drug: Ms. Printy  reports that she does not use drugs. Alcohol:  reports that she does not drink alcohol. Tobacco:   reports that she has quit smoking. She has never used smokeless tobacco. Medical:  has a past medical history of Angina pectoris ([place]); Anxiety; Arthritis; Arthropathy of sacroiliac joint ([place]) (04/11/2015); Asthma; Atypical face pain (04/24/2015); CAD (coronary artery disease); Chronic back pain; Depression; Fibromyalgia; GERD (gastroesophageal reflux disease); 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) (04/11/2015); Hyperlipidemia; Hypertension; Low back pain (04/06/2015); Lumbar radicular pain (04/18/2015); Migraine; Narrowing of intervertebral disc space (04/11/2015); Sacroiliac joint pain (04/11/2015); and Spine disorder. Family: family history includes Diabetes in her father and mother; Hypertension in her sister; Stroke in her mother.  Past Surgical History:  Procedure Laterality Date  . ABDOMINAL HYSTERECTOMY    . APPENDECTOMY    . CHOLECYSTECTOMY    . COLONOSCOPY WITH PROPOFOL N/A 07/19/2015  Procedure: COLONOSCOPY WITH PROPOFOL;  Surgeon: Manya Silvas, MD;  Location: [facility] ENDOSCOPY;  Service: Endoscopy;  Laterality: N/A;  . NECK SURGERY Bilateral   . NECK SURGERY    . SHOULDER SURGERY     Constitutional Exam  General appearance: Well nourished, well developed, and well hydrated. In no apparent acute distress Vitals:   09/12/16 0845  BP: 123/60  Pulse: 84  Resp: 16  Temp: 97.8 F (36.6 C)  TempSrc: Oral  SpO2: 100%  Weight: 139 lb (63 kg)  Height: 5' (1.524 m)   BMI Assessment: Estimated body mass index is 27.15 kg/m as calculated from the following:   Height as of this encounter: 5' (1.524 m).   Weight as of this encounter: 139 lb (63 kg).  BMI interpretation table: BMI level Category Range association with higher incidence of chronic pain  <18 kg/m2 Underweight   18.5-24.9 kg/m2 Ideal body weight   25-29.9 kg/m2 Overweight Increased incidence by 20%  30-34.9 kg/m2 Obese (Class I) Increased incidence by 68%  35-39.9 kg/m2 Severe obesity (Class II) Increased incidence  by 136%  >40 kg/m2 Extreme obesity (Class III) Increased incidence by 254%   BMI Readings from Last 4 Encounters:  09/12/16 27.15 kg/m  08/21/16 27.34 kg/m  06/18/16 26.89 kg/m  06/03/16 27.93 kg/m   Wt Readings from Last 4 Encounters:  09/12/16 139 lb (63 kg)  08/21/16 140 lb (63.5 kg)  06/18/16 140 lb (63.5 kg)  06/03/16 143 lb (64.9 kg)  Psych/Mental status: Alert, oriented x 3 (person, place, & time)       Eyes: PERLA Respiratory: No evidence of acute respiratory distress  Cervical Spine Exam  Inspection: No masses, redness, or swelling Alignment: Symmetrical Functional ROM: Unrestricted ROM Stability: No instability detected Muscle strength & Tone: Functionally intact Sensory: Unimpaired Palpation: Non-contributory  Upper Extremity (UE) Exam    Side: Right upper extremity  Side: Left upper extremity  Inspection: No masses, redness, swelling, or asymmetry. No contractures  Inspection: No masses, redness, swelling, or asymmetry. No contractures  Functional ROM: Unrestricted ROM          Functional ROM: Unrestricted ROM          Muscle strength & Tone: Functionally intact  Muscle strength & Tone: Functionally intact  Sensory: Unimpaired  Sensory: Unimpaired  Palpation: Euthermic  Palpation: Euthermic  Specialized Test(s): Deferred         Specialized Test(s): Deferred          Thoracic Spine Exam  Inspection: No masses, redness, or swelling Alignment: Symmetrical Functional ROM: Unrestricted ROM Stability: No instability detected Sensory: Unimpaired Muscle strength & Tone: Functionally intact Palpation: Non-contributory  Lumbar Spine Exam  Inspection: No masses, redness, or swelling Alignment: Symmetrical Functional ROM: Decreased ROM Stability: No instability detected Muscle strength & Tone: Functionally intact Sensory: Movement-associated pain Palpation: Complains of area being tender to palpation Provocative Tests: Lumbar Hyperextension and rotation  test: Positive bilaterally for facet joint pain. Patrick's Maneuver: Positive for left-sided S-I joint pain           Gait & Posture Assessment  Ambulation: Unassisted Gait: Relatively normal for age and body habitus Posture: WNL   Lower Extremity Exam    Side: Right lower extremity  Side: Left lower extremity  Inspection: No masses, redness, swelling, or asymmetry. No contractures  Inspection: No masses, redness, swelling, or asymmetry. No contractures  Functional ROM: Unrestricted ROM          Functional ROM: Unrestricted ROM  Muscle strength & Tone: Functionally intact  Muscle strength & Tone: Functionally intact  Sensory: Unimpaired  Sensory: Unimpaired  Palpation: No palpable anomalies  Palpation: No palpable anomalies   Assessment  Primary Diagnosis & Pertinent Problem List: The primary encounter diagnosis was Chronic pain syndrome. 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  Status Diagnosis  Controlled Controlled Controlled 1. Chronic pain syndrome   2. Chronic lumbar radicular pain (Location of Primary Source of Pain) (Left) (S1 Dermatome)   3. Chronic low back pain (Location of Secondary source of pain) (Bilateral) (midline to tailbone) (R>L)   4. 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 Lumbar spondylosis   6. Long term prescription opiate use   7. Opiate use (56.25 MME/Day)   8. Neurogenic pain   9. Myofascial pain      Plan of Care  Pharmacotherapy (Medications Ordered): Meds ordered this encounter  Medications  . oxyCODONE-acetaminophen (PERCOCET) 7.5-325 MG tablet    Sig: Take 1 tablet by mouth 5  (five) times daily as needed for severe pain.    Dispense:  150 tablet    Refill:  0    Do not place this medication, or any other prescription from our practice, on "Automatic Refill". Patient may have prescription filled one day early if pharmacy is closed on scheduled refill date. Do not fill until: 10/27/16 To last until: 11/26/16  . oxyCODONE-acetaminophen (PERCOCET) 7.5-325 MG tablet    Sig: Take 1 tablet by mouth 5 (five) times daily as needed for severe pain.    Dispense:  150 tablet    Refill:  0    Do not place this medication, or any other prescription from our practice, on "Automatic Refill". Patient may have prescription filled one day early if pharmacy is closed on scheduled refill date. Do not fill until: 09/27/16 To last until: 10/27/16  . oxyCODONE-acetaminophen (PERCOCET) 7.5-325 MG tablet    Sig: Take 1 tablet by mouth 5 (five) times daily as needed for severe pain.    Dispense:  150 tablet    Refill:  0    Do not place this medication, or any other prescription from our practice, on "Automatic Refill". Patient may have prescription filled one day early if pharmacy is closed on scheduled refill date. Do not fill until: 11/26/16 To last until: 12/26/16  . gabapentin (NEURONTIN) 300 MG capsule    Sig: Take 1 capsule (300 mg total) by mouth 2 (two) times daily.    Dispense:  180 capsule    Refill:  0    Do not add this medication to the electronic "Automatic Refill" notification system. Patient may have prescription filled one day early if pharmacy is closed on scheduled refill date.  . methocarbamol (ROBAXIN) 750 MG tablet    Sig: Take 1 tablet (750 mg total) by mouth 3 (three) times daily.    Dispense:  90 tablet    Refill:  2    Do not place medication on "Automatic Refill". Fill one day early if pharmacy is closed on scheduled refill date.   New Prescriptions   No medications on file   Medications administered today: Ms. Rodocker had no medications administered  during this  visit. Lab-work, procedure(s), and/or referral(s): Orders Placed This Encounter  Procedures  . Caudal Epidural Injection   Imaging and/or referral(s): None  Interventional therapies: Planned, scheduled, and/or pending:   Diagnostic caudal epidural steroid injection + diagnostic epidurogram under fluoroscopic guidance and IV sedation    Considering:   Diagnostic caudal epidural steroid injection + diagnostic epidurogram Right sided lumbar facet radiofrequency ablation + Left S-I Block  Diagnostic cervical epidural steroid injection  Diagnostic bilateral cervical facet block  Possible bilateral cervical facet radiofrequency ablation  Diagnostic bilateral lumbar facet block  Possible bilateral lumbar facet radiofrequency ablation  Diagnostic left L4-5 lumbar epidural steroid injection  Diagnostic left caudal epidural steroid injection  Diagnostic left S1 selective nerve root block  Diagnostic bilateral sacroiliac joint block   Possible bilateral sacroiliac joint radiofrequency ablation  Diagnostic right trochanteric bursa injection    Palliative PRN treatment(s):   Palliative cervical epidural steroid injection  Palliative bilateral cervical facet block  Palliative bilateral lumbar facet block  Palliative left L4-5 lumbar epidural steroid injection  Palliative left caudal epidural steroid injection  Palliative left S1 selective nerve root block  Palliative bilateral sacroiliac joint block  Palliative right trochanteric bursa injection    Provider-requested follow-up: Return in about 3 months (around 12/13/2016) for (MD) Med-Mgmt, in addition, procedure (ASAP).  Future Appointments Date Time Provider Hall  12/05/2016 8:45 AM Milinda Pointer, MD [facility] None   Primary Care Physician: Lavera Guise, MD Location: [facility] Outpatient Pain Management Facility Note by: Kathlen Brunswick. Dossie Arbour, M.D, DABA, DABAPM, DABPM, DABIPP, FIPP Date: 09/12/2016; Time: 3:46  PM  Pain Score Disclaimer: We use the NRS-11 scale. This is a self-reported, subjective measurement of pain severity with only modest accuracy. It is used primarily to identify changes within a particular patient. It must be understood that outpatient pain scales are significantly less accurate that those used for research, where they can be applied under ideal controlled circumstances with minimal exposure to variables. In reality, the score is likely to be a combination of pain intensity and pain affect, where pain affect describes the degree of emotional arousal or changes in action readiness caused by the sensory experience of pain. Factors such as social and work situation, setting, emotional state, anxiety levels, expectation, and prior pain experience may influence pain perception and show large inter-individual differences that may also be affected by time variables.  Patient instructions provided during this appointment: Patient Instructions   GENERAL RISKS AND COMPLICATIONS  What are the risk, side effects and possible complications? Generally speaking, most procedures are safe.  However, with any procedure there are risks, side effects, and the possibility of complications.  The risks and complications are dependent upon the sites that are lesioned, or the type of nerve block to be performed.  The closer the procedure is to the spine, the more serious the risks are.  Great care is taken when placing the radio frequency needles, block needles or lesioning probes, but sometimes complications can occur. 1. Infection: Any time there is an injection through the skin, there is a risk of infection.  This is why sterile conditions are used for these blocks.  There are four possible types of infection. 1. Localized skin infection. 2. Central Nervous System Infection-This can be in the form of Meningitis, which can be deadly. 3. Epidural Infections-This can be in the form of an epidural abscess,  which can cause pressure inside of the spine, causing compression of the spinal cord with subsequent paralysis. This would require  an emergency surgery to decompress, and there are no guarantees that the patient would recover from the paralysis. 4. Discitis-This is an infection of the intervertebral discs.  It occurs in about 1% of discography procedures.  It is difficult to treat and it may lead to surgery.        2. Pain: the needles have to go through skin and soft tissues, will cause soreness.       3. Damage to internal structures:  The nerves to be lesioned may be near blood vessels or    other nerves which can be potentially damaged.       4. Bleeding: Bleeding is more common if the patient is taking blood thinners such as  aspirin, Coumadin, Ticiid, Plavix, etc., or if he/she have some genetic predisposition  such as hemophilia. Bleeding into the spinal canal can cause compression of the spinal  cord with subsequent paralysis.  This would require an emergency surgery to  decompress and there are no guarantees that the patient would recover from the  paralysis.       5. Pneumothorax:  Puncturing of a lung is a possibility, every time a needle is introduced in  the area of the chest or upper back.  Pneumothorax refers to free air around the  collapsed lung(s), inside of the thoracic cavity (chest cavity).  Another two possible  complications related to a similar event would include: Hemothorax and Chylothorax.   These are variations of the Pneumothorax, where instead of air around the collapsed  lung(s), you may have blood or chyle, respectively.       6. Spinal headaches: They may occur with any procedures in the area of the spine.       7. Persistent CSF (Cerebro-Spinal Fluid) leakage: This is a rare problem, but may occur  with prolonged intrathecal or epidural catheters either due to the formation of a fistulous  track or a dural tear.       8. Nerve damage: By working so close to the spinal  cord, there is always a possibility of  nerve damage, which could be as serious as a permanent spinal cord injury with  paralysis.       9. Death:  Although rare, severe deadly allergic reactions known as "Anaphylactic  reaction" can occur to any of the medications used.      10. Worsening of the symptoms:  We can always make thing worse.  What are the chances of something like this happening? Chances of any of this occuring are extremely low.  By statistics, you have more of a chance of getting killed in a motor vehicle accident: while driving to the hospital than any of the above occurring .  Nevertheless, you should be aware that they are possibilities.  In general, it is similar to taking a shower.  Everybody knows that you can slip, hit your head and get killed.  Does that mean that you should not shower again?  Nevertheless always keep in mind that statistics do not mean anything if you happen to be on the wrong side of them.  Even if a procedure has a 1 (one) in a 1,000,000 (million) chance of going wrong, it you happen to be that one..Also, keep in mind that by statistics, you have more of a chance of having something go wrong when taking medications.  Who should not have this procedure? If you are on a blood thinning medication (e.g. Coumadin, Plavix, see list of "Blood Thinners"),  or if you have an active infection going on, you should not have the procedure.  If you are taking any blood thinners, please inform your physician.  How should I prepare for this procedure?  Do not eat or drink anything at least six hours prior to the procedure.  Bring a driver with you .  It cannot be a taxi.  Come accompanied by an adult that can drive you back, and that is strong enough to help you if your legs get weak or numb from the local anesthetic.  Take all of your medicines the morning of the procedure with just enough water to swallow them.  If you have diabetes, make sure that you are scheduled  to have your procedure done first thing in the morning, whenever possible.  If you have diabetes, take only half of your insulin dose and notify our nurse that you have done so as soon as you arrive at the clinic.  If you are diabetic, but only take blood sugar pills (oral hypoglycemic), then do not take them on the morning of your procedure.  You may take them after you have had the procedure.  Do not take aspirin or any aspirin-containing medications, at least eleven (11) days prior to the procedure.  They may prolong bleeding.  Wear loose fitting clothing that may be easy to take off and that you would not mind if it got stained with Betadine or blood.  Do not wear any jewelry or perfume  Remove any nail coloring.  It will interfere with some of our monitoring equipment.  NOTE: Remember that this is not meant to be interpreted as a complete list of all possible complications.  Unforeseen problems may occur.  BLOOD THINNERS The following drugs contain aspirin or other products, which can cause increased bleeding during surgery and should not be taken for 2 weeks prior to and 1 week after surgery.  If you should need take something for relief of minor pain, you may take acetaminophen which is found in Tylenol,m Datril, Anacin-3 and Panadol. It is not blood thinner. The products listed below are.  Do not take any of the products listed below in addition to any listed on your instruction sheet.  A.P.C or A.P.C with Codeine Codeine Phosphate Capsules #3 Ibuprofen Ridaura  ABC compound Congesprin Imuran rimadil  Advil Cope Indocin Robaxisal  Alka-Seltzer Effervescent Pain Reliever and Antacid Coricidin or Coricidin-D  Indomethacin Rufen  Alka-Seltzer plus Cold Medicine Cosprin Ketoprofen S-A-C Tablets  Anacin Analgesic Tablets or Capsules Coumadin Korlgesic Salflex  Anacin Extra Strength Analgesic tablets or capsules CP-2 Tablets Lanoril Salicylate  Anaprox Cuprimine Capsules Levenox Salocol   Anexsia-D Dalteparin Magan Salsalate  Anodynos Darvon compound Magnesium Salicylate Sine-off  Ansaid Dasin Capsules Magsal Sodium Salicylate  Anturane Depen Capsules Marnal Soma  APF Arthritis pain formula Dewitt's Pills Measurin Stanback  Argesic Dia-Gesic Meclofenamic Sulfinpyrazone  Arthritis Bayer Timed Release Aspirin Diclofenac Meclomen Sulindac  Arthritis pain formula Anacin Dicumarol Medipren Supac  Analgesic (Safety coated) Arthralgen Diffunasal Mefanamic Suprofen  Arthritis Strength Bufferin Dihydrocodeine Mepro Compound Suprol  Arthropan liquid Dopirydamole Methcarbomol with Aspirin Synalgos  ASA tablets/Enseals Disalcid Micrainin Tagament  Ascriptin Doan's Midol Talwin  Ascriptin A/D Dolene Mobidin Tanderil  Ascriptin Extra Strength Dolobid Moblgesic Ticlid  Ascriptin with Codeine Doloprin or Doloprin with Codeine Momentum Tolectin  Asperbuf Duoprin Mono-gesic Trendar  Aspergum Duradyne Motrin or Motrin IB Triminicin  Aspirin plain, buffered or enteric coated Durasal Myochrisine Trigesic  Aspirin Suppositories Easprin Nalfon Trillsate  Aspirin with Codeine Ecotrin Regular or Extra Strength Naprosyn Uracel  Atromid-S Efficin Naproxen Ursinus  Auranofin Capsules Elmiron Neocylate Vanquish  Axotal Emagrin Norgesic Verin  Azathioprine Empirin or Empirin with Codeine Normiflo Vitamin E  Azolid Emprazil Nuprin Voltaren  Bayer Aspirin plain, buffered or children's or timed BC Tablets or powders Encaprin Orgaran Warfarin Sodium  Buff-a-Comp Enoxaparin Orudis Zorpin  Buff-a-Comp with Codeine Equegesic Os-Cal-Gesic   Buffaprin Excedrin plain, buffered or Extra Strength Oxalid   Bufferin Arthritis Strength Feldene Oxphenbutazone   Bufferin plain or Extra Strength Feldene Capsules Oxycodone with Aspirin   Bufferin with Codeine Fenoprofen Fenoprofen Pabalate or Pabalate-SF   Buffets II Flogesic Panagesic   Buffinol plain or Extra Strength Florinal or Florinal with Codeine  Panwarfarin   Buf-Tabs Flurbiprofen Penicillamine   Butalbital Compound Four-way cold tablets Penicillin   Butazolidin Fragmin Pepto-Bismol   Carbenicillin Geminisyn Percodan   Carna Arthritis Reliever Geopen Persantine   Carprofen Gold's salt Persistin   Chloramphenicol Goody's Phenylbutazone   Chloromycetin Haltrain Piroxlcam   Clmetidine heparin Plaquenil   Cllnoril Hyco-pap Ponstel   Clofibrate Hydroxy chloroquine Propoxyphen         Before stopping any of these medications, be sure to consult the physician who ordered them.  Some, such as Coumadin (Warfarin) are ordered to prevent or treat serious conditions such as "deep thrombosis", "pumonary embolisms", and other heart problems.  The amount of time that you may need off of the medication may also vary with the medication and the reason for which you were taking it.  If you are taking any of these medications, please make sure you notify your pain physician before you undergo any procedures.         Epidural Steroid Injection Patient Information  Description: The epidural space surrounds the nerves as they exit the spinal cord.  In some patients, the nerves can be compressed and inflamed by a bulging disc or a tight spinal canal (spinal stenosis).  By injecting steroids into the epidural space, we can bring irritated nerves into direct contact with a potentially helpful medication.  These steroids act directly on the irritated nerves and can reduce swelling and inflammation which often leads to decreased pain.  Epidural steroids may be injected anywhere along the spine and from the neck to the low back depending upon the location of your pain.   After numbing the skin with local anesthetic (like Novocaine), a small needle is passed into the epidural space slowly.  You may experience a sensation of pressure while this is being done.  The entire block usually last less than 10 minutes.  Conditions which may be treated by epidural  steroids:   Low back and leg pain  Neck and arm pain  Spinal stenosis  Post-laminectomy syndrome  Herpes zoster (shingles) pain  Pain from compression fractures  Preparation for the injection:  1. Do not eat any solid food or dairy products within 8 hours of your appointment.  2. You may drink clear liquids up to 3 hours before appointment.  Clear liquids include water, black coffee, juice or soda.  No milk or cream please. 3. You may take your regular medication, including pain medications, with a sip of water before your appointment  Diabetics should hold regular insulin (if taken separately) and take 1/2 normal NPH dos the morning of the procedure.  Carry some sugar containing items with you to your appointment. 4. A driver must accompany you and be prepared to drive you home after your  procedure.  5. Bring all your current medications with your. 6. An IV may be inserted and sedation may be given at the discretion of the physician.   7. A blood pressure cuff, EKG and other monitors will often be applied during the procedure.  Some patients may need to have extra oxygen administered for a short period. 8. You will be asked to provide medical information, including your allergies, prior to the procedure.  We must know immediately if you are taking blood thinners (like Coumadin/Warfarin)  Or if you are allergic to IV iodine contrast (dye). We must know if you could possible be pregnant.  Possible side-effects:  Bleeding from needle site  Infection (rare, may require surgery)  Nerve injury (rare)  Numbness & tingling (temporary)  Difficulty urinating (rare, temporary)  Spinal headache ( a headache worse with upright posture)  Light -headedness (temporary)  Pain at injection site (several days)  Decreased blood pressure (temporary)  Weakness in arm/leg (temporary)  Pressure sensation in back/neck (temporary)  Call if you experience:  Fever/chills associated with  headache or increased back/neck pain.  Headache worsened by an upright position.  New onset weakness or numbness of an extremity below the injection site  Hives or difficulty breathing (go to the emergency room)  Inflammation or drainage at the infection site  Severe back/neck pain  Any new symptoms which are concerning to you  Please note:  Although the local anesthetic injected can often make your back or neck feel good for several hours after the injection, the pain will likely return.  It takes 3-7 days for steroids to work in the epidural space.  You may not notice any pain relief for at least that one week.  If effective, we will often do a series of three injections spaced 3-6 weeks apart to maximally decrease your pain.  After the initial series, we generally will wait several months before considering a repeat injection of the same type.  If you have any questions, please call [phone] [facility]

## 2016-09-12 NOTE — Progress Notes (Signed)
Nursing Pain Medication Assessment:  Safety precautions to be maintained throughout the outpatient stay will include: orient to surroundings, keep bed in low position, maintain call bell within reach at all times, provide assistance with transfer out of bed and ambulation.  Medication Inspection Compliance: Pill count conducted under aseptic conditions, in front of the patient. Neither the pills nor the bottle was removed from the patient's sight at any time. Once count was completed pills were immediately returned to the patient in their original bottle.  Medication: Oxycodone/APAP Pill/Patch Count: 68 of 150 pills remain Bottle Appearance: Standard pharmacy container. Clearly labeled. Filled Date: 02/27 / 2018 Last Medication intake:  Today

## 2016-09-12 NOTE — Patient Instructions (Signed)
GENERAL RISKS AND COMPLICATIONS  What are the risk, side effects and possible complications? Generally speaking, most procedures are safe.  However, with any procedure there are risks, side effects, and the possibility of complications.  The risks and complications are dependent upon the sites that are lesioned, or the type of nerve block to be performed.  The closer the procedure is to the spine, the more serious the risks are.  Great care is taken when placing the radio frequency needles, block needles or lesioning probes, but sometimes complications can occur. 1. Infection: Any time there is an injection through the skin, there is a risk of infection.  This is why sterile conditions are used for these blocks.  There are four possible types of infection. 1. Localized skin infection. 2. Central Nervous System Infection-This can be in the form of Meningitis, which can be deadly. 3. Epidural Infections-This can be in the form of an epidural abscess, which can cause pressure inside of the spine, causing compression of the spinal cord with subsequent paralysis. This would require an emergency surgery to decompress, and there are no guarantees that the patient would recover from the paralysis. 4. Discitis-This is an infection of the intervertebral discs.  It occurs in about 1% of discography procedures.  It is difficult to treat and it may lead to surgery.        2. Pain: the needles have to go through skin and soft tissues, will cause soreness.       3. Damage to internal structures:  The nerves to be lesioned may be near blood vessels or    other nerves which can be potentially damaged.       4. Bleeding: Bleeding is more common if the patient is taking blood thinners such as  aspirin, Coumadin, Ticiid, Plavix, etc., or if he/she have some genetic predisposition  such as hemophilia. Bleeding into the spinal canal can cause compression of the spinal  cord with subsequent paralysis.  This would require an  emergency surgery to  decompress and there are no guarantees that the patient would recover from the  paralysis.       5. Pneumothorax:  Puncturing of a lung is a possibility, every time a needle is introduced in  the area of the chest or upper back.  Pneumothorax refers to free air around the  collapsed lung(s), inside of the thoracic cavity (chest cavity).  Another two possible  complications related to a similar event would include: Hemothorax and Chylothorax.   These are variations of the Pneumothorax, where instead of air around the collapsed  lung(s), you may have blood or chyle, respectively.       6. Spinal headaches: They may occur with any procedures in the area of the spine.       7. Persistent CSF (Cerebro-Spinal Fluid) leakage: This is a rare problem, but may occur  with prolonged intrathecal or epidural catheters either due to the formation of a fistulous  track or a dural tear.       8. Nerve damage: By working so close to the spinal cord, there is always a possibility of  nerve damage, which could be as serious as a permanent spinal cord injury with  paralysis.       9. Death:  Although rare, severe deadly allergic reactions known as "Anaphylactic  reaction" can occur to any of the medications used.      10. Worsening of the symptoms:  We can always make thing worse.    What are the chances of something like this happening? Chances of any of this occuring are extremely low.  By statistics, you have more of a chance of getting killed in a motor vehicle accident: while driving to the hospital than any of the above occurring .  Nevertheless, you should be aware that they are possibilities.  In general, it is similar to taking a shower.  Everybody knows that you can slip, hit your head and get killed.  Does that mean that you should not shower again?  Nevertheless always keep in mind that statistics do not mean anything if you happen to be on the wrong side of them.  Even if a procedure has a 1  (one) in a 1,000,000 (million) chance of going wrong, it you happen to be that one..Also, keep in mind that by statistics, you have more of a chance of having something go wrong when taking medications.  Who should not have this procedure? If you are on a blood thinning medication (e.g. Coumadin, Plavix, see list of "Blood Thinners"), or if you have an active infection going on, you should not have the procedure.  If you are taking any blood thinners, please inform your physician.  How should I prepare for this procedure?  Do not eat or drink anything at least six hours prior to the procedure.  Bring a driver with you .  It cannot be a taxi.  Come accompanied by an adult that can drive you back, and that is strong enough to help you if your legs get weak or numb from the local anesthetic.  Take all of your medicines the morning of the procedure with just enough water to swallow them.  If you have diabetes, make sure that you are scheduled to have your procedure done first thing in the morning, whenever possible.  If you have diabetes, take only half of your insulin dose and notify our nurse that you have done so as soon as you arrive at the clinic.  If you are diabetic, but only take blood sugar pills (oral hypoglycemic), then do not take them on the morning of your procedure.  You may take them after you have had the procedure.  Do not take aspirin or any aspirin-containing medications, at least eleven (11) days prior to the procedure.  They may prolong bleeding.  Wear loose fitting clothing that may be easy to take off and that you would not mind if it got stained with Betadine or blood.  Do not wear any jewelry or perfume  Remove any nail coloring.  It will interfere with some of our monitoring equipment.  NOTE: Remember that this is not meant to be interpreted as a complete list of all possible complications.  Unforeseen problems may occur.  BLOOD THINNERS The following drugs  contain aspirin or other products, which can cause increased bleeding during surgery and should not be taken for 2 weeks prior to and 1 week after surgery.  If you should need take something for relief of minor pain, you may take acetaminophen which is found in Tylenol,m Datril, Anacin-3 and Panadol. It is not blood thinner. The products listed below are.  Do not take any of the products listed below in addition to any listed on your instruction sheet.  A.P.C or A.P.C with Codeine Codeine Phosphate Capsules #3 Ibuprofen Ridaura  ABC compound Congesprin Imuran rimadil  Advil Cope Indocin Robaxisal  Alka-Seltzer Effervescent Pain Reliever and Antacid Coricidin or Coricidin-D  Indomethacin Rufen    Alka-Seltzer plus Cold Medicine Cosprin Ketoprofen S-A-C Tablets  Anacin Analgesic Tablets or Capsules Coumadin Korlgesic Salflex  Anacin Extra Strength Analgesic tablets or capsules CP-2 Tablets Lanoril Salicylate  Anaprox Cuprimine Capsules Levenox Salocol  Anexsia-D Dalteparin Magan Salsalate  Anodynos Darvon compound Magnesium Salicylate Sine-off  Ansaid Dasin Capsules Magsal Sodium Salicylate  Anturane Depen Capsules Marnal Soma  APF Arthritis pain formula Dewitt's Pills Measurin Stanback  Argesic Dia-Gesic Meclofenamic Sulfinpyrazone  Arthritis Bayer Timed Release Aspirin Diclofenac Meclomen Sulindac  Arthritis pain formula Anacin Dicumarol Medipren Supac  Analgesic (Safety coated) Arthralgen Diffunasal Mefanamic Suprofen  Arthritis Strength Bufferin Dihydrocodeine Mepro Compound Suprol  Arthropan liquid Dopirydamole Methcarbomol with Aspirin Synalgos  ASA tablets/Enseals Disalcid Micrainin Tagament  Ascriptin Doan's Midol Talwin  Ascriptin A/D Dolene Mobidin Tanderil  Ascriptin Extra Strength Dolobid Moblgesic Ticlid  Ascriptin with Codeine Doloprin or Doloprin with Codeine Momentum Tolectin  Asperbuf Duoprin Mono-gesic Trendar  Aspergum Duradyne Motrin or Motrin IB Triminicin  Aspirin  plain, buffered or enteric coated Durasal Myochrisine Trigesic  Aspirin Suppositories Easprin Nalfon Trillsate  Aspirin with Codeine Ecotrin Regular or Extra Strength Naprosyn Uracel  Atromid-S Efficin Naproxen Ursinus  Auranofin Capsules Elmiron Neocylate Vanquish  Axotal Emagrin Norgesic Verin  Azathioprine Empirin or Empirin with Codeine Normiflo Vitamin E  Azolid Emprazil Nuprin Voltaren  Bayer Aspirin plain, buffered or children's or timed BC Tablets or powders Encaprin Orgaran Warfarin Sodium  Buff-a-Comp Enoxaparin Orudis Zorpin  Buff-a-Comp with Codeine Equegesic Os-Cal-Gesic   Buffaprin Excedrin plain, buffered or Extra Strength Oxalid   Bufferin Arthritis Strength Feldene Oxphenbutazone   Bufferin plain or Extra Strength Feldene Capsules Oxycodone with Aspirin   Bufferin with Codeine Fenoprofen Fenoprofen Pabalate or Pabalate-SF   Buffets II Flogesic Panagesic   Buffinol plain or Extra Strength Florinal or Florinal with Codeine Panwarfarin   Buf-Tabs Flurbiprofen Penicillamine   Butalbital Compound Four-way cold tablets Penicillin   Butazolidin Fragmin Pepto-Bismol   Carbenicillin Geminisyn Percodan   Carna Arthritis Reliever Geopen Persantine   Carprofen Gold's salt Persistin   Chloramphenicol Goody's Phenylbutazone   Chloromycetin Haltrain Piroxlcam   Clmetidine heparin Plaquenil   Cllnoril Hyco-pap Ponstel   Clofibrate Hydroxy chloroquine Propoxyphen         Before stopping any of these medications, be sure to consult the physician who ordered them.  Some, such as Coumadin (Warfarin) are ordered to prevent or treat serious conditions such as "deep thrombosis", "pumonary embolisms", and other heart problems.  The amount of time that you may need off of the medication may also vary with the medication and the reason for which you were taking it.  If you are taking any of these medications, please make sure you notify your pain physician before you undergo any  procedures.         Epidural Steroid Injection Patient Information  Description: The epidural space surrounds the nerves as they exit the spinal cord.  In some patients, the nerves can be compressed and inflamed by a bulging disc or a tight spinal canal (spinal stenosis).  By injecting steroids into the epidural space, we can bring irritated nerves into direct contact with a potentially helpful medication.  These steroids act directly on the irritated nerves and can reduce swelling and inflammation which often leads to decreased pain.  Epidural steroids may be injected anywhere along the spine and from the neck to the low back depending upon the location of your pain.   After numbing the skin with local anesthetic (like Novocaine), a small needle is passed   into the epidural space slowly.  You may experience a sensation of pressure while this is being done.  The entire block usually last less than 10 minutes.  Conditions which may be treated by epidural steroids:   Low back and leg pain  Neck and arm pain  Spinal stenosis  Post-laminectomy syndrome  Herpes zoster (shingles) pain  Pain from compression fractures  Preparation for the injection:  1. Do not eat any solid food or dairy products within 8 hours of your appointment.  2. You may drink clear liquids up to 3 hours before appointment.  Clear liquids include water, black coffee, juice or soda.  No milk or cream please. 3. You may take your regular medication, including pain medications, with a sip of water before your appointment  Diabetics should hold regular insulin (if taken separately) and take 1/2 normal NPH dos the morning of the procedure.  Carry some sugar containing items with you to your appointment. 4. A driver must accompany you and be prepared to drive you home after your procedure.  5. Bring all your current medications with your. 6. An IV may be inserted and sedation may be given at the discretion of the  physician.   7. A blood pressure cuff, EKG and other monitors will often be applied during the procedure.  Some patients may need to have extra oxygen administered for a short period. 8. You will be asked to provide medical information, including your allergies, prior to the procedure.  We must know immediately if you are taking blood thinners (like Coumadin/Warfarin)  Or if you are allergic to IV iodine contrast (dye). We must know if you could possible be pregnant.  Possible side-effects:  Bleeding from needle site  Infection (rare, may require surgery)  Nerve injury (rare)  Numbness & tingling (temporary)  Difficulty urinating (rare, temporary)  Spinal headache ( a headache worse with upright posture)  Light -headedness (temporary)  Pain at injection site (several days)  Decreased blood pressure (temporary)  Weakness in arm/leg (temporary)  Pressure sensation in back/neck (temporary)  Call if you experience:  Fever/chills associated with headache or increased back/neck pain.  Headache worsened by an upright position.  New onset weakness or numbness of an extremity below the injection site  Hives or difficulty breathing (go to the emergency room)  Inflammation or drainage at the infection site  Severe back/neck pain  Any new symptoms which are concerning to you  Please note:  Although the local anesthetic injected can often make your back or neck feel good for several hours after the injection, the pain will likely return.  It takes 3-7 days for steroids to work in the epidural space.  You may not notice any pain relief for at least that one week.  If effective, we will often do a series of three injections spaced 3-6 weeks apart to maximally decrease your pain.  After the initial series, we generally will wait several months before considering a repeat injection of the same type.  If you have any questions, please call [phone] [facility] 

## 2016-09-17 ENCOUNTER — Other Ambulatory Visit: Payer: Self-pay | Admitting: Internal Medicine

## 2016-09-17 DIAGNOSIS — Z1231 Encounter for screening mammogram for malignant neoplasm of breast: Secondary | ICD-10-CM

## 2016-10-02 ENCOUNTER — Ambulatory Visit: Payer: Medicare HMO | Admitting: Pain Medicine

## 2016-11-04 DIAGNOSIS — M810 Age-related osteoporosis without current pathological fracture: Secondary | ICD-10-CM | POA: Insufficient documentation

## 2016-12-04 DIAGNOSIS — M5136 Other intervertebral disc degeneration, lumbar region: Secondary | ICD-10-CM | POA: Insufficient documentation

## 2016-12-04 NOTE — Progress Notes (Signed)
Patient's Name: Jacqueline Thornton  MRN: [id number]  Referring Provider: Lavera Guise, MD  DOB: 06/30/1951  PCP: Lavera Guise, MD  DOS: 12/05/2016  Note by: Kathlen Brunswick. Dossie Arbour, MD  Service setting: Ambulatory outpatient  Specialty: Interventional Pain Management  Location: [facility] (AMB) Pain Management Facility    Patient type: Established   Primary Reason(s) for Visit: Encounter for prescription drug management (Level of risk: moderate) CC: Back Pain (lower) and Hip Pain (left)  HPI  Jacqueline Thornton is a 66 y.o. year old, female patient, who comes today for a medication management evaluation. She has Chronic pain syndrome; Absolute anemia; Gout; HLD (hyperlipidemia); Headache, migraine; Panic attack; Chronic low back pain (Location of Secondary source of pain) (Bilateral) (midline to tailbone) (R>L); Lumbar spondylosis;  Lumbar annular disc tear (L4-5); Discogenic low back pain (L3-4 and L4-5); Lumbar facet hypertrophy; Lumbar facet syndrome (Bilateral) (R>L); Chronic neck pain; Cervical spondylosis; Hx of cervical spine surgery; Cervical spinal fusion (C6-7 interbody fusion); Coronary atherosclerosis; Long term current use of opiate analgesic; Encounter for therapeutic drug level monitoring; Uncomplicated opioid dependence ([place]); Opiate use (56.25 MME/Day); CAD in native artery; Anxiety, generalized; Chronic sacroiliac joint pain (Bilateral) (L>R); Chronic lumbar radicular pain (Location of Primary Source of Pain) (Left) (S1 Dermatome); Long term prescription opiate use; Encounter for chronic pain management; Trochanteric bursitis of right hip; Chronic hip pain (Left); Neurogenic pain; Myofascial pain; Osteoarthritis of hip (Left); Arthrodesis status; Degenerative disk disease; Myalgia; and Trochanteric bursitis of hip (Bilateral) (L>R) on her problem list. Her primarily concern today is the Back Pain (lower) and Hip Pain (left)  Pain Assessment: Self-Reported Pain Score: 3 /10             Reported level is  compatible with observation.       Pain Type: Chronic pain Pain Location: Back (left hip) Pain Orientation: Lower Pain Descriptors / Indicators: Aching, Constant, Radiating, Stabbing, Burning (burning pain in buttocks and hip) Pain Frequency: Constant  Jacqueline Thornton was last scheduled for an appointment on 09/12/2016 for medication management. During today's appointment we reviewed Jacqueline Thornton's chronic pain status, as well as her outpatient medication regimen. The patient's insurance company has denied pain on her Robaxin and therefore today we will be switching her to cyclobenzaprine (Flexeril). In addition, the patient is having pain over the trochanteric bursa, bilaterally. This was confirmed today on physical examination. In addition, the patient had a positive Patrick maneuver on the left side for pain coming from the left sacroiliac joint. She has requested that we do the trochanteric bursa injection and the SI joint injection on her next visit. She has also requested IV sedation. The patient recently visited her orthopedic surgeon pointed out that she has osteopenia. Today we will update her lab work and check on her vitamin D and B12 levels, both of which were low in the past.  The patient  reports that she does not use drugs. Her body mass index is 27.93 kg/m.  Further details on both, my assessment(s), as well as the proposed treatment plan, please see below.  Controlled Substance Pharmacotherapy Assessment REMS (Risk Evaluation and Mitigation Strategy)  Analgesic:Oxycodone 7.5/325 one tablet 5 times a day. (56.25 mg/day) MME/day:56.25 mg/day  Lona Millard, RN  12/05/2016  9:05 AM  Sign at close encounter Nursing Pain Medication Assessment:  Safety precautions to be maintained throughout the outpatient stay will include: orient to surroundings, keep bed in low position, maintain call bell within reach at all times, provide assistance with  transfer out of bed and ambulation.   Medication Inspection Compliance: Pill count conducted under aseptic conditions, in front of the patient. Neither the pills nor the bottle was removed from the patient's sight at any time. Once count was completed pills were immediately returned to the patient in their original bottle.  Medication: Oxycodone IR Pill/Patch Count: 100 of 150 pills remain Pill/Patch Appearance: Markings consistent with prescribed medication Bottle Appearance: Standard pharmacy container. Clearly labeled. Filled Date: 05 / 29/ 2018 Last Medication intake:  Today   Pharmacokinetics: Liberation and absorption (onset of action): WNL Distribution (time to peak effect): WNL Metabolism and excretion (duration of action): WNL         Pharmacodynamics: Desired effects: Analgesia: Jacqueline Thornton reports >50% benefit. Functional ability: Patient reports that medication allows her to accomplish basic ADLs Clinically meaningful improvement in function (CMIF): Sustained CMIF goals met Perceived effectiveness: Described as relatively effective, allowing for increase in activities of daily living (ADL) Undesirable effects: Side-effects or Adverse reactions: None reported Monitoring: [place] PMP: Online review of the past 35-monthperiod conducted. Compliant with practice rules and regulations List of all UDS test(s) done:  Lab Results  Component Value Date   TOXASSSELUR FINAL 09/27/2015   TOXASSSELUR FINAL 08/23/2015   TOXASSSELUR FINAL 07/06/2015   Last UDS on record: Tox[employer]ure Select 13  Date Value Ref Range Status  09/27/2015 FINAL  Final    Comment:    ==================================================================== TOXASSURE SELECT 13 (MW) ==================================================================== Test                             Result       Flag       Units Drug Present and Declared for Prescription Verification   Oxycodone                      2337         EXPECTED   ng/mg creat   Oxymorphone                     368          EXPECTED   ng/mg creat   Noroxycodone                   >3802        EXPECTED   ng/mg creat   Noroxymorphone                 372          EXPECTED   ng/mg creat    Sources of oxycodone are scheduled prescription medications.    Oxymorphone, noroxycodone, and noroxymorphone are expected    metabolites of oxycodone. Oxymorphone is also available as a    scheduled prescription medication. ==================================================================== Test                      Result    Flag   Units      Ref Range   Creatinine              263              mg/dL      >=20 ==================================================================== Declared Medications:  The flagging and interpretation on this report are based on the  following declared medications.  Unexpected results may arise from  inaccuracies in the declared medications.  **Note: The testing scope of this panel includes these medications:  Oxycodone (Percocet)  **Note: The testing scope of this panel does not include following  reported medications:  Acetaminophen (Percocet)  Duloxetine (Cymbalta)  Enalapril (Vasotec)  Estradiol (Estrace)  Fenofibrate  Gabapentin  Meloxicam (Mobic)  Methocarbamol (Robaxin)  Naloxone  Omeprazole (Nexium)  Vitamin D ==================================================================== For clinical consultation, please call [phone]. ====================================================================    UDS interpretation: Compliant          Medication Assessment Form: Reviewed. Patient indicates being compliant with therapy Treatment compliance: Compliant Risk Assessment Profile: Aberrant behavior: See prior evaluations. None observed or detected today Comorbid factors increasing risk of overdose: See prior notes. No additional risks detected today Risk of substance use disorder (SUD): Low Opioid Risk Tool (ORT) Total Score: 4  Interpretation  Table:  Score <3 = Low Risk for SUD  Score between 4-7 = Moderate Risk for SUD  Score >8 = High Risk for Opioid Abuse   Risk Mitigation Strategies:  Patient Counseling: Covered Patient-Prescriber Agreement (PPA): Present and active  Notification to other healthcare providers: Done  Pharmacologic Plan: No change in therapy, at this time  Laboratory Chemistry  Inflammation Markers Lab Results  Component Value Date   CRP <0.5 08/23/2015   ESRSEDRATE 8 08/23/2015   (CRP: Acute Phase) (ESR: Chronic Phase) Renal Function Markers Lab Results  Component Value Date   BUN 15 08/23/2015   CREATININE 0.77 08/23/2015   GFRAA >60 08/23/2015   GFRNONAA >60 08/23/2015   Hepatic Function Markers Lab Results  Component Value Date   AST 16 08/23/2015   ALT 13 (L) 08/23/2015   ALBUMIN 3.9 08/23/2015   ALKPHOS 48 08/23/2015   Electrolytes Lab Results  Component Value Date   NA 140 08/23/2015   K 3.7 08/23/2015   CL 106 08/23/2015   CALCIUM 9.1 08/23/2015   MG 2.2 08/23/2015   Neuropathy Markers Lab Results  Component Value Date   VITAMINB12 161 (L) 12/27/2015   Bone Pathology Markers Lab Results  Component Value Date   ALKPHOS 48 08/23/2015   25OHVITD1 28 (L) 12/27/2015   25OHVITD2 <1.0 12/27/2015   25OHVITD3 28 12/27/2015   CALCIUM 9.1 08/23/2015   Coagulation Parameters Lab Results  Component Value Date   INR 1.0 03/02/2014   LABPROT 12.7 03/02/2014   PLT 197 01/26/2012   Cardiovascular Markers Lab Results  Component Value Date   HGB 14.2 01/26/2012   HCT 41.1 01/26/2012   Note: Lab results reviewed.  Recent Diagnostic Imaging Review  Dg C-arm 1-60 Min-no Report  Result Date: 08/21/2016 Fluoroscopy was utilized by the requesting physician.  No radiographic interpretation.   Note: Imaging results reviewed.          Meds  The patient has a current medication list which includes the following prescription(s): cholecalciferol, cyclobenzaprine, duloxetine,  enalapril, esomeprazole, estradiol, fe fum-fa-b [id number]-cu, gabapentin, oxycodone-acetaminophen, oxycodone-acetaminophen, oxycodone-acetaminophen, and rosuvastatin.  Current Outpatient Prescriptions on File Prior to Visit  Medication Sig  . cholecalciferol (VITAMIN D) 400 UNITS TABS tablet Take 1,000 Units by mouth daily.  . DULoxetine (CYMBALTA) 60 MG capsule Take 60 mg by mouth daily.   . enalapril (VASOTEC) 5 MG tablet Take 5 mg by mouth daily.  Marland Kitchen esomeprazole (NEXIUM) 20 MG capsule Take 20 mg by mouth daily at 12 noon.  Marland Kitchen estradiol (ESTRACE) 0.5 MG tablet Take 0.5 mg by mouth daily.   . rosuvastatin (CRESTOR) 5 MG tablet Take 5 mg by mouth every other day.    No current facility-administered medications on file prior to visit.  ROS  Constitutional: Denies any fever or chills Gastrointestinal: No reported hemesis, hematochezia, vomiting, or acute GI distress Musculoskeletal: Denies any acute onset joint swelling, redness, loss of ROM, or weakness Neurological: No reported episodes of acute onset apraxia, aphasia, dysarthria, agnosia, amnesia, paralysis, loss of coordination, or loss of consciousness  Allergies  Ms. Ferrer has No Known Allergies.  [place]  Drug: Ms. Koke  reports that she does not use drugs. Alcohol:  reports that she does not drink alcohol. Tobacco:  reports that she has quit smoking. She has never used smokeless tobacco. Medical:  has a past medical history of Angina pectoris ([place]); Anxiety; Arthritis; Arthropathy of sacroiliac joint ([place]) (04/11/2015); Asthma; Atypical face pain (04/24/2015); CAD (coronary artery disease); Chronic back pain; Depression; Fibromyalgia; GERD (gastroesophageal reflux disease); 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) (04/11/2015); Hyperlipidemia; Hypertension; Low back pain (04/06/2015); Lumbar radicular pain (04/18/2015); Migraine; Narrowing of intervertebral disc space (04/11/2015); Sacroiliac joint pain (04/11/2015); and Spine  disorder. Family: family history includes Diabetes in her father and mother; Hypertension in her sister; Stroke in her mother.  Past Surgical History:  Procedure Laterality Date  . ABDOMINAL HYSTERECTOMY    . APPENDECTOMY    . CHOLECYSTECTOMY    . COLONOSCOPY WITH PROPOFOL N/A 07/19/2015   Procedure: COLONOSCOPY WITH PROPOFOL;  Surgeon: Manya Silvas, MD;  Location: [facility] ENDOSCOPY;  Service: Endoscopy;  Laterality: N/A;  . NECK SURGERY Bilateral   . NECK SURGERY    . SHOULDER SURGERY     Constitutional Exam  General appearance: Well nourished, well developed, and well hydrated. In no apparent acute distress Vitals:   12/05/16 0851  BP: 130/61  Pulse: 77  Resp: 16  Temp: 98.3 F (36.8 C)  SpO2: 99%  Weight: 143 lb (64.9 kg)  Height: 5' (1.524 m)   BMI Assessment: Estimated body mass index is 27.93 kg/m as calculated from the following:   Height as of this encounter: 5' (1.524 m).   Weight as of this encounter: 143 lb (64.9 kg).  BMI interpretation table: BMI level Category Range association with higher incidence of chronic pain  <18 kg/m2 Underweight   18.5-24.9 kg/m2 Ideal body weight   25-29.9 kg/m2 Overweight Increased incidence by 20%  30-34.9 kg/m2 Obese (Class I) Increased incidence by 68%  35-39.9 kg/m2 Severe obesity (Class II) Increased incidence by 136%  >40 kg/m2 Extreme obesity (Class III) Increased incidence by 254%   BMI Readings from Last 4 Encounters:  12/05/16 27.93 kg/m  09/12/16 27.15 kg/m  08/21/16 27.34 kg/m  06/18/16 26.89 kg/m   Wt Readings from Last 4 Encounters:  12/05/16 143 lb (64.9 kg)  09/12/16 139 lb (63 kg)  08/21/16 140 lb (63.5 kg)  06/18/16 140 lb (63.5 kg)  Psych/Mental status: Alert, oriented x 3 (person, place, & time)       Eyes: PERLA Respiratory: No evidence of acute respiratory distress  Cervical Spine Exam  Inspection: No masses, redness, or swelling Alignment: Symmetrical Functional ROM: Unrestricted ROM       Stability: No instability detected Muscle strength & Tone: Functionally intact Sensory: Unimpaired Palpation: No palpable anomalies              Upper Extremity (UE) Exam    Side: Right upper extremity  Side: Left upper extremity  Inspection: No masses, redness, swelling, or asymmetry. No contractures  Inspection: No masses, redness, swelling, or asymmetry. No contractures  Functional ROM: Unrestricted ROM          Functional ROM: Unrestricted ROM  Muscle strength & Tone: Functionally intact  Muscle strength & Tone: Functionally intact  Sensory: Unimpaired  Sensory: Unimpaired  Palpation: No palpable anomalies              Palpation: No palpable anomalies              Specialized Test(s): Deferred         Specialized Test(s): Deferred          Thoracic Spine Exam  Inspection: No masses, redness, or swelling Alignment: Symmetrical Functional ROM: Unrestricted ROM Stability: No instability detected Sensory: Unimpaired Muscle strength & Tone: No palpable anomalies  Lumbar Spine Exam  Inspection: No masses, redness, or swelling Alignment: Symmetrical Functional ROM: Unrestricted ROM      Stability: No instability detected Muscle strength & Tone: Functionally intact Sensory: Unimpaired Palpation: No palpable anomalies       Provocative Tests: Lumbar Hyperextension and rotation test: evaluation deferred today       Patrick's Maneuver: evaluation deferred today                    Gait & Posture Assessment  Ambulation: Unassisted Gait: Relatively normal for age and body habitus Posture: WNL   Lower Extremity Exam    Side: Right lower extremity  Side: Left lower extremity  Inspection: No masses, redness, swelling, or asymmetry. No contractures  Inspection: No masses, redness, swelling, or asymmetry. No contractures  Functional ROM: Unrestricted ROM          Functional ROM: Unrestricted ROM          Muscle strength & Tone: Functionally intact  Muscle strength & Tone:  Functionally intact  Sensory: Unimpaired  Sensory: Unimpaired  Palpation: No palpable anomalies  Palpation: No palpable anomalies   Assessment  Primary Diagnosis & Pertinent Problem List: The primary encounter diagnosis was Trochanteric bursitis of hip (Bilateral) (L>R). Diagnoses of Chronic sacroiliac joint pain (Bilateral) (L>R), Chronic lumbar radicular pain (Location of Primary Source of Pain) (Left) (S1 Dermatome), Chronic low back pain (Location of Secondary source of pain) (Bilateral) (midline to tailbone) (R>L), Chronic pain syndrome, Long term prescription opiate use, Opiate use (56.25 MME/Day), Myofascial pain, and Neurogenic pain were also pertinent to this visit.  Status Diagnosis  Controlled Controlled Controlled 1. 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 Chronic sacroiliac joint pain (Bilateral) (L>R)   3. Chronic lumbar radicular pain (Location of Primary Source of Pain) (Left) (S1 Dermatome)   4. Chronic low back pain (Location of Secondary source of pain) (Bilateral) (midline to tailbone) (R>L)   5. Chronic pain syndrome   6. Long term prescription opiate use   7. Opiate use (56.25 MME/Day)   8. Myofascial pain   9. Neurogenic pain     Problems updated and reviewed during this visit: Problem  Trochanteric bursitis of hip (Bilateral) (L>R)  Chronic sacroiliac joint pain (Bilateral) (L>R)   Plan of Care  Pharmacotherapy (Medications Ordered): Meds ordered this encounter  Medications  . oxyCODONE-acetaminophen (PERCOCET) 7.5-325 MG tablet    Sig: Take 1 tablet by mouth 5 (five) times daily as needed for severe pain.    Dispense:  150 tablet    Refill:  0    Do not place this medication, or any other prescription from our practice, on "Automatic Refill". Patient may have prescription filled one day early if pharmacy is closed on scheduled refill date. Do not fill until: 01/25/17 To last until: 02/24/17  . oxyCODONE-acetaminophen (PERCOCET) 7.5-325 MG  tablet  Sig: Take 1 tablet by mouth 5 (five) times daily as needed for severe pain.    Dispense:  150 tablet    Refill:  0    Do not place this medication, or any other prescription from our practice, on "Automatic Refill". Patient may have prescription filled one day early if pharmacy is closed on scheduled refill date. Do not fill until: 12/26/16 To last until: 01/25/17  . oxyCODONE-acetaminophen (PERCOCET) 7.5-325 MG tablet    Sig: Take 1 tablet by mouth 5 (five) times daily as needed for severe pain.    Dispense:  150 tablet    Refill:  0    Do not place this medication, or any other prescription from our practice, on "Automatic Refill". Patient may have prescription filled one day early if pharmacy is closed on scheduled refill date. Do not fill until: 02/24/17 To last until: 03/26/17  . DISCONTD: methocarbamol (ROBAXIN) 750 MG tablet    Sig: Take 1 tablet (750 mg total) by mouth 3 (three) times daily.    Dispense:  90 tablet    Refill:  2    Do not place medication on "Automatic Refill". Fill one day early if pharmacy is closed on scheduled refill date.  . gabapentin (NEURONTIN) 300 MG capsule    Sig: Take 1 capsule (300 mg total) by mouth 2 (two) times daily.    Dispense:  180 capsule    Refill:  0    Do not add this medication to the electronic "Automatic Refill" notification system. Patient may have prescription filled one day early if pharmacy is closed on scheduled refill date.  . cyclobenzaprine (FLEXERIL) 10 MG tablet    Sig: Take 1 tablet (10 mg total) by mouth 3 (three) times daily as needed for muscle spasms.    Dispense:  90 tablet    Refill:  0    Do not place this medication, or any other prescription from our practice, on "Automatic Refill". Patient may have prescription filled one day early if pharmacy is closed on scheduled refill date.   New Prescriptions   CYCLOBENZAPRINE (FLEXERIL) 10 MG TABLET    Take 1 tablet (10 mg total) by mouth 3 (three) times daily as  needed for muscle spasms.   Medications administered today: [name] had no medications administered during this visit. Lab-work, procedure(s), and/or referral(s): Orders Placed This Encounter  Procedures  . HIP INJECTION  . SACROILIAC JOINT INJECTINS  . Comprehensive metabolic panel  . C-reactive protein  . Magnesium  . Sedimentation rate  . Vitamin B12  . 25-Hydroxyvitamin D Lcms D2+D3   Imaging and/or referral(s): None  Interventional therapies: Planned, scheduled, and/or pending:   Diagnostic bilateral trochanteric bursa injection + left sacroiliac joint block under fluoroscopic guidance and IV sedation    Considering:   Diagnostic caudal epidural steroid injection + diagnostic epidurogram Palliative Right sided lumbar facet radiofrequency ablation + Left S-I Block  Diagnostic cervical epidural steroid injection  Diagnostic bilateral cervical facet block  Possible bilateral cervical facet radiofrequency ablation  Diagnostic bilateral lumbar facet block  Possible bilateral lumbar facet radiofrequency ablation  Diagnostic left L4-5 lumbar epidural steroid injection  Diagnostic left S1 selective nerve root block  Diagnostic bilateral sacroiliac joint block   Possible bilateral sacroiliac joint radiofrequency ablation  Diagnostic right trochanteric bursa injection    Palliative PRN treatment(s):   Palliative cervical epidural steroid injection  Palliative bilateral cervical facet block  Palliative bilateral lumbar facet block  Palliative left L4-5 lumbar epidural steroid  injection  Palliative left caudal epidural steroid injection  Palliative left S1 selective nerve root block  Palliative bilateral sacroiliac joint block  Palliative right trochanteric bursa injection    Provider-requested follow-up: Return in about 3 months (around 03/07/2017) for Med-Mgmt, by NP, in addition, procedure (w/ sedation):, (ASAP), by MD.  Future Appointments Date Time Provider  [place]  03/11/2017 8:45 AM Vevelyn Francois, NP [facility] None   Primary Care Physician: Lavera Guise, MD Location: [facility] Note by: Kathlen Brunswick. Dossie Arbour, M.D, DABA, DABAPM, DABPM, DABIPP, FIPP Date: 12/05/2016; Time: 9:49 AM  Patient instructions provided during this appointment: Patient Instructions  You have been given 3 scripts for oxycodone/acetaminophen. Preparing for Procedure with Sedation Instructions: . Oral Intake: Do not eat or drink anything for at least 8 hours prior to your procedure. . Transportation: Public transportation is not allowed. Bring an adult driver. The driver must be physically present in our waiting room before any procedure can be started. Marland Kitchen Physical Assistance: Bring an adult capable of physically assisting you, in the event you need help. . Blood Pressure Medicine: Take your blood pressure medicine with a sip of water the morning of the procedure. . Insulin: Take only  of your normal insulin dose. . Preventing infections: Shower with an antibacterial soap the morning of your procedure. . Build-up your immune system: Take 1000 mg of Vitamin C with every meal (3 times a day) the day prior to your procedure. . Pregnancy: If you are pregnant, call and cancel the procedure. . Sickness: If you have a cold, fever, or any active infections, call and cancel the procedure. . Arrival: You must be in the facility at least 30 minutes prior to your scheduled procedure. . Children: Do not bring children with you. . Dress appropriately: Bring dark clothing that you would not mind if they get stained. . Valuables: Do not bring any jewelry or valuables. Procedure appointments are reserved for interventional treatments only. Marland Kitchen No Prescription Refills. . No medication changes will be discussed during procedure appointments. No disability issues will be discussed.Trigger Point Injections Patient Information  Description: Trigger  points are areas of muscle sensitive to touch which cause pain with movement, sometimes felt some distance from the site of palpation.  Usually the muscle containing these trigger points if felt as a tight band or knot.   The area of maximum tenderness or trigger point is identified, and after antiseptic preparation of the skin, a small needle is placed into this site.  Reproduction of the pain often occurs and numbing medicine (local anesthetic) is injected into the site, sometimes along with steroid preparation.  The entire block usually lasts less than 5 minutes.  Conditions which may be treated by trigger points:   Muscular pain and spasm  Nerve irritation  Preparation for the injection:  1. Do not eat any solid food or dairy products within 8 hours of your appointment. 2. You may drink clear liquids up to 3 hours before appointment.  Clear liquids include water, black coffee, juice or soda.  No milk or cream please. 3. You may take your regular medications, including pain medications, with a sip of water before your appointment.  Diabetics should hold regular insulin ( if take separately) and take 1/2 normal NPH dose the morning of the procedure.  Carry some sugar containing items with you to your appointment. 4. A driver must accompany you and be prepared to drive you home after your procedure.  5.  Bring all your current medications with you. 6. An IV may be inserted and sedation may be given at the discretion of the physician.  7. A blood pressure cuff, EKG, and other monitors will often be applied during the procedure.  Some patients may need to have extra oxygen administered for a short period. 8. You will be asked to provide medical information, including your allergies and medications, prior to the procedure.  We must know immediately if you are taking blood thinners (like Coumadin/Warfarin) or if you are allergic to IV iodine contrast (dye).  We must know if you could possibly be  pregnant.  Possible side-effects:   Bleeding from needle site  Infection (rare, may require surgery)  Nerve injury (rare)  Numbness & tingling (temporary)  Punctured lung (if injection around chest)  Light-headedness (temporary)  Pain at injection site (several days)  Decreased blood pressure (rare, temporary)  Weakness in arm/leg (temporary)  Call if you experience:   Hive or difficulty breathing (go to the emergency room)  Inflammation or drainage at the injection site(s)  Please note:  Although the local anesthetic injected can often make your painful muscle feel good for several hours after the injection, the pain may return.  It takes 3-7 days for steroids to work.  You may not notice any pain relief for at least one week.  If effective, we will often do a series of injections spaced 3-6 weeks apart to maximally decrease your pain.  If you have any questions please call [phone] [facility]

## 2016-12-05 ENCOUNTER — Other Ambulatory Visit: Payer: Self-pay | Admitting: Pain Medicine

## 2016-12-05 ENCOUNTER — Ambulatory Visit: Payer: Medicare HMO | Attending: Pain Medicine | Admitting: Pain Medicine

## 2016-12-05 ENCOUNTER — Encounter: Payer: Self-pay | Admitting: Pain Medicine

## 2016-12-05 VITALS — BP 130/61 | HR 77 | Temp 98.3°F | Resp 16 | Ht 60.0 in | Wt 143.0 lb

## 2016-12-05 DIAGNOSIS — Z9049 Acquired absence of other specified parts of digestive tract: Secondary | ICD-10-CM | POA: Insufficient documentation

## 2016-12-05 DIAGNOSIS — K219 Gastro-esophageal reflux disease without esophagitis: Secondary | ICD-10-CM | POA: Insufficient documentation

## 2016-12-05 DIAGNOSIS — Z79899 Other long term (current) drug therapy: Secondary | ICD-10-CM | POA: Diagnosis not present

## 2016-12-05 DIAGNOSIS — M533 Sacrococcygeal disorders, not elsewhere classified: Secondary | ICD-10-CM

## 2016-12-05 DIAGNOSIS — I2510 Atherosclerotic heart disease of native coronary artery without angina pectoris: Secondary | ICD-10-CM | POA: Insufficient documentation

## 2016-12-05 DIAGNOSIS — Z833 Family history of diabetes mellitus: Secondary | ICD-10-CM | POA: Diagnosis not present

## 2016-12-05 DIAGNOSIS — M791 Myalgia: Secondary | ICD-10-CM

## 2016-12-05 DIAGNOSIS — M7071 Other bursitis of hip, right hip: Secondary | ICD-10-CM | POA: Insufficient documentation

## 2016-12-05 DIAGNOSIS — G894 Chronic pain syndrome: Secondary | ICD-10-CM

## 2016-12-05 DIAGNOSIS — G8929 Other chronic pain: Secondary | ICD-10-CM | POA: Diagnosis not present

## 2016-12-05 DIAGNOSIS — M1612 Unilateral primary osteoarthritis, left hip: Secondary | ICD-10-CM | POA: Diagnosis not present

## 2016-12-05 DIAGNOSIS — Z5181 Encounter for therapeutic drug level monitoring: Secondary | ICD-10-CM | POA: Insufficient documentation

## 2016-12-05 DIAGNOSIS — M7072 Other bursitis of hip, left hip: Secondary | ICD-10-CM | POA: Insufficient documentation

## 2016-12-05 DIAGNOSIS — M7918 Myalgia, other site: Secondary | ICD-10-CM

## 2016-12-05 DIAGNOSIS — F1190 Opioid use, unspecified, uncomplicated: Secondary | ICD-10-CM

## 2016-12-05 DIAGNOSIS — M545 Low back pain: Secondary | ICD-10-CM | POA: Diagnosis not present

## 2016-12-05 DIAGNOSIS — Z823 Family history of stroke: Secondary | ICD-10-CM | POA: Diagnosis not present

## 2016-12-05 DIAGNOSIS — M792 Neuralgia and neuritis, unspecified: Secondary | ICD-10-CM

## 2016-12-05 DIAGNOSIS — M25552 Pain in left hip: Secondary | ICD-10-CM | POA: Insufficient documentation

## 2016-12-05 DIAGNOSIS — Z90710 Acquired absence of both cervix and uterus: Secondary | ICD-10-CM | POA: Diagnosis not present

## 2016-12-05 DIAGNOSIS — Z87891 Personal history of nicotine dependence: Secondary | ICD-10-CM | POA: Diagnosis not present

## 2016-12-05 DIAGNOSIS — Z8249 Family history of ischemic heart disease and other diseases of the circulatory system: Secondary | ICD-10-CM | POA: Diagnosis not present

## 2016-12-05 DIAGNOSIS — M7061 Trochanteric bursitis, right hip: Secondary | ICD-10-CM

## 2016-12-05 DIAGNOSIS — M5416 Radiculopathy, lumbar region: Secondary | ICD-10-CM

## 2016-12-05 DIAGNOSIS — Z79891 Long term (current) use of opiate analgesic: Secondary | ICD-10-CM

## 2016-12-05 DIAGNOSIS — M7062 Trochanteric bursitis, left hip: Secondary | ICD-10-CM

## 2016-12-05 MED ORDER — GABAPENTIN 300 MG PO CAPS: 300.0000 mg | ORAL_CAPSULE | Freq: Two times a day (BID) | ORAL | 0 refills | Status: DC

## 2016-12-05 MED ORDER — OXYCODONE-ACETAMINOPHEN 7.5-325 MG PO TABS: 1.0000 | ORAL_TABLET | Freq: Every day | ORAL | 0 refills | Status: DC | PRN

## 2016-12-05 MED ORDER — CYCLOBENZAPRINE HCL 10 MG PO TABS: 10.0000 mg | ORAL_TABLET | Freq: Three times a day (TID) | ORAL | 0 refills | Status: DC | PRN

## 2016-12-05 MED ORDER — METHOCARBAMOL 750 MG PO TABS: 750.0000 mg | ORAL_TABLET | Freq: Three times a day (TID) | ORAL | 2 refills | Status: DC

## 2016-12-05 NOTE — Progress Notes (Signed)
Nursing Pain Medication Assessment:  Safety precautions to be maintained throughout the outpatient stay will include: orient to surroundings, keep bed in low position, maintain call bell within reach at all times, provide assistance with transfer out of bed and ambulation.  Medication Inspection Compliance: Pill count conducted under aseptic conditions, in front of the patient. Neither the pills nor the bottle was removed from the patient's sight at any time. Once count was completed pills were immediately returned to the patient in their original bottle.  Medication: Oxycodone IR Pill/Patch Count: 100 of 150 pills remain Pill/Patch Appearance: Markings consistent with prescribed medication Bottle Appearance: Standard pharmacy container. Clearly labeled. Filled Date: 05 / 29/ 2018 Last Medication intake:  Today

## 2016-12-05 NOTE — Patient Instructions (Addendum)
You have been given 3 scripts for oxycodone/acetaminophen. Preparing for Procedure with Sedation Instructions: . Oral Intake: Do not eat or drink anything for at least 8 hours prior to your procedure. . Transportation: Public transportation is not allowed. Bring an adult driver. The driver must be physically present in our waiting room before any procedure can be started. [facility] Physical Assistance: Bring an adult capable of physically assisting you, in the event you need help. . Blood Pressure Medicine: Take your blood pressure medicine with a sip of water the morning of the procedure. . Insulin: Take only  of your normal insulin dose. . Preventing infections: Shower with an antibacterial soap the morning of your procedure. . Build-up your immune system: Take 1000 mg of Vitamin C with every meal (3 times a day) the day prior to your procedure. . Pregnancy: If you are pregnant, call and cancel the procedure. . Sickness: If you have a cold, fever, or any active infections, call and cancel the procedure. . Arrival: You must be in the facility at least 30 minutes prior to your scheduled procedure. . Children: Do not bring children with you. . Dress appropriately: Bring dark clothing that you would not mind if they get stained. . Valuables: Do not bring any jewelry or valuables. Procedure appointments are reserved for interventional treatments only. Marland Kitchen No Prescription Refills. . No medication changes will be discussed during procedure appointments. No disability issues will be discussed.Trigger Point Injections Patient Information  Description: Trigger points are areas of muscle sensitive to touch which cause pain with movement, sometimes felt some distance from the site of palpation.  Usually the muscle containing these trigger points if felt as a tight band or knot.   The area of maximum tenderness or trigger point is identified, and after antiseptic preparation of the skin, a small needle is placed  into this site.  Reproduction of the pain often occurs and numbing medicine (local anesthetic) is injected into the site, sometimes along with steroid preparation.  The entire block usually lasts less than 5 minutes.  Conditions which may be treated by trigger points:   Muscular pain and spasm  Nerve irritation  Preparation for the injection:  1. Do not eat any solid food or dairy products within 8 hours of your appointment. 2. You may drink clear liquids up to 3 hours before appointment.  Clear liquids include water, black coffee, juice or soda.  No milk or cream please. 3. You may take your regular medications, including pain medications, with a sip of water before your appointment.  Diabetics should hold regular insulin ( if take separately) and take 1/2 normal NPH dose the morning of the procedure.  Carry some sugar containing items with you to your appointment. 4. A driver must accompany you and be prepared to drive you home after your procedure.  5. Bring all your current medications with you. 6. An IV may be inserted and sedation may be given at the discretion of the physician.  7. A blood pressure cuff, EKG, and other monitors will often be applied during the procedure.  Some patients may need to have extra oxygen administered for a short period. 8. You will be asked to provide medical information, including your allergies and medications, prior to the procedure.  We must know immediately if you are taking blood thinners (like Coumadin/Warfarin) or if you are allergic to IV iodine contrast (dye).  We must know if you could possibly be pregnant.  Possible side-effects:   Bleeding  from needle site  Infection (rare, may require surgery)  Nerve injury (rare)  Numbness & tingling (temporary)  Punctured lung (if injection around chest)  Light-headedness (temporary)  Pain at injection site (several days)  Decreased blood pressure (rare, temporary)  Weakness in arm/leg  (temporary)  Call if you experience:   Hive or difficulty breathing (go to the emergency room)  Inflammation or drainage at the injection site(s)  Please note:  Although the local anesthetic injected can often make your painful muscle feel good for several hours after the injection, the pain may return.  It takes 3-7 days for steroids to work.  You may not notice any pain relief for at least one week.  If effective, we will often do a series of injections spaced 3-6 weeks apart to maximally decrease your pain.  If you have any questions please call [phone] [facility]

## 2016-12-09 LAB — COMPREHENSIVE METABOLIC PANEL
ALT: 13 IU/L (ref 0–32)
AST: 19 IU/L (ref 0–40)
Albumin/Globulin Ratio: 2.3 — ABNORMAL HIGH (ref 1.2–2.2)
Albumin: 3.7 g/dL (ref 3.6–4.8)
Alkaline Phosphatase: 48 IU/L (ref 39–117)
BUN/Creatinine Ratio: 9 — ABNORMAL LOW (ref 12–28)
BUN: 8 mg/dL (ref 8–27)
Bilirubin Total: 0.3 mg/dL (ref 0.0–1.2)
CO2: 26 mmol/L (ref 18–29)
Calcium: 9.3 mg/dL (ref 8.7–10.3)
Chloride: 103 mmol/L (ref 96–106)
Creatinine, Ser: 0.88 mg/dL (ref 0.57–1.00)
GFR calc Af Amer: 80 mL/min/{1.73_m2} (ref >59)
GFR calc non Af Amer: 70 mL/min/{1.73_m2} (ref >59)
Globulin, Total: 1.6 g/dL (ref 1.5–4.5)
Glucose: 82 mg/dL (ref 65–99)
Potassium: 4 mmol/L (ref 3.5–5.2)
Sodium: 140 mmol/L (ref 134–144)
Total Protein: 5.3 g/dL — ABNORMAL LOW (ref 6.0–8.5)

## 2016-12-09 LAB — SEDIMENTATION RATE: Sed Rate: 2 mm/hr (ref 0–40)

## 2016-12-09 LAB — MAGNESIUM: Magnesium: 2.2 mg/dL (ref 1.6–2.3)

## 2016-12-09 LAB — C-REACTIVE PROTEIN: CRP: 4.8 mg/L (ref 0.0–4.9)

## 2016-12-09 LAB — 25-HYDROXY VITAMIN D LCMS D2+D3
25-Hydroxy, Vitamin D-2: <1 ng/mL
25-Hydroxy, Vitamin D-3: 49 ng/mL
25-Hydroxy, Vitamin D: 50 ng/mL

## 2016-12-09 LAB — VITAMIN B12: Vitamin B-12: 202 pg/mL — ABNORMAL LOW (ref 232–1245)

## 2016-12-11 ENCOUNTER — Ambulatory Visit
Admission: RE | Admit: 2016-12-11 | Discharge: 2016-12-11 | Disposition: A | Discharge status: Home / Self Care | Payer: Medicare HMO | Source: Ambulatory Visit | Attending: Pain Medicine | Admitting: Pain Medicine

## 2016-12-11 ENCOUNTER — Encounter: Payer: Self-pay | Admitting: Pain Medicine

## 2016-12-11 ENCOUNTER — Ambulatory Visit (HOSPITAL_BASED_OUTPATIENT_CLINIC_OR_DEPARTMENT_OTHER): Payer: Medicare HMO | Admitting: Pain Medicine

## 2016-12-11 VITALS — BP 115/73 | HR 84 | Temp 97.7°F | Resp 24 | Ht 60.0 in | Wt 143.0 lb

## 2016-12-11 DIAGNOSIS — M25551 Pain in right hip: Secondary | ICD-10-CM | POA: Diagnosis not present

## 2016-12-11 DIAGNOSIS — M7062 Trochanteric bursitis, left hip: Secondary | ICD-10-CM

## 2016-12-11 DIAGNOSIS — G8929 Other chronic pain: Secondary | ICD-10-CM | POA: Diagnosis not present

## 2016-12-11 DIAGNOSIS — M7061 Trochanteric bursitis, right hip: Secondary | ICD-10-CM | POA: Insufficient documentation

## 2016-12-11 DIAGNOSIS — M533 Sacrococcygeal disorders, not elsewhere classified: Secondary | ICD-10-CM

## 2016-12-11 DIAGNOSIS — M25552 Pain in left hip: Secondary | ICD-10-CM

## 2016-12-11 MED ORDER — LACTATED RINGERS IV SOLN
1000.0000 mL | Freq: Once | INTRAVENOUS | Status: AC
  Administered 2016-12-11: 1000 mL via INTRAVENOUS

## 2016-12-11 MED ORDER — ROPIVACAINE HCL 2 MG/ML IJ SOLN
4.0000 mL | Freq: Once | INTRAMUSCULAR | Status: AC
  Administered 2016-12-11: 10 mL via INTRA_ARTICULAR

## 2016-12-11 MED ORDER — ROPIVACAINE HCL 2 MG/ML IJ SOLN
INTRAMUSCULAR | Status: AC
  Filled 2016-12-11: qty 20

## 2016-12-11 MED ORDER — ROPIVACAINE HCL 2 MG/ML IJ SOLN
9.0000 mL | Freq: Once | INTRAMUSCULAR | Status: AC
  Administered 2016-12-11: 10 mL via INTRA_ARTICULAR

## 2016-12-11 MED ORDER — MIDAZOLAM HCL 5 MG/5ML IJ SOLN
1.0000 mg | INTRAMUSCULAR | Status: DC | PRN
  Administered 2016-12-11: 5 mg via INTRAVENOUS

## 2016-12-11 MED ORDER — MIDAZOLAM HCL 5 MG/5ML IJ SOLN
INTRAMUSCULAR | Status: AC
  Filled 2016-12-11: qty 5

## 2016-12-11 MED ORDER — FENTANYL CITRATE (PF) 100 MCG/2ML IJ SOLN
INTRAMUSCULAR | Status: AC
  Filled 2016-12-11: qty 2

## 2016-12-11 MED ORDER — METHYLPREDNISOLONE ACETATE 40 MG/ML IJ SUSP
40.0000 mg | Freq: Once | INTRAMUSCULAR | Status: AC
  Administered 2016-12-11: 40 mg via INTRA_ARTICULAR

## 2016-12-11 MED ORDER — FENTANYL CITRATE (PF) 100 MCG/2ML IJ SOLN
25.0000 ug | INTRAMUSCULAR | Status: DC | PRN
Start: 2016-12-11 — End: 2016-12-11
  Administered 2016-12-11: 100 ug via INTRAVENOUS

## 2016-12-11 MED ORDER — METHYLPREDNISOLONE ACETATE 80 MG/ML IJ SUSP
INTRAMUSCULAR | Status: AC
  Filled 2016-12-11: qty 1

## 2016-12-11 MED ORDER — METHYLPREDNISOLONE ACETATE 80 MG/ML IJ SUSP
80.0000 mg | Freq: Once | INTRAMUSCULAR | Status: AC
  Administered 2016-12-11: 80 mg via INTRA_ARTICULAR

## 2016-12-11 MED ORDER — LIDOCAINE HCL (PF) 1.5 % IJ SOLN
20.0000 mL | Freq: Once | INTRAMUSCULAR | Status: AC
  Administered 2016-12-11: 20 mL
  Filled 2016-12-11: qty 20

## 2016-12-11 MED ORDER — METHYLPREDNISOLONE ACETATE 40 MG/ML IJ SUSP
INTRAMUSCULAR | Status: AC
  Filled 2016-12-11: qty 1

## 2016-12-11 NOTE — Progress Notes (Signed)
Safety precautions to be maintained throughout the outpatient stay will include: orient to surroundings, keep bed in low position, maintain call bell within reach at all times, provide assistance with transfer out of bed and ambulation.  

## 2016-12-11 NOTE — Patient Instructions (Addendum)

## 2016-12-11 NOTE — Progress Notes (Signed)
Patient's Name: Jacqueline Thornton  MRN: [id number]  Referring Provider: Milinda Pointer, MD  DOB: Nov 10, 1950  PCP: Lavera Guise, MD  DOS: 12/11/2016  Note by: Kathlen Brunswick. Dossie Arbour, MD  Service setting: Ambulatory outpatient  Location: [facility] (AMB) Pain Management Facility  Visit type: Procedure  Specialty: Interventional Pain Management  Patient type: Established   Primary Reason for Visit: Interventional Pain Management Treatment. CC: Hip Pain (both sides)  Procedure #1:  Anesthesia, Analgesia, Anxiolysis:  Type: Palliative Superficial Trochanteric Bursa Injection Region: Greater Femoral Trochanter Region Level: Hip Joint Laterality: Bilateral  Type: Local Anesthesia with Moderate (Conscious) Sedation Local Anesthetic: Lidocaine 1% Route: Intravenous (IV) IV Access: Secured Sedation: Meaningful verbal contact was maintained at all times during the procedure  Indication(s): Analgesia and Anxiety  Indications: 1. 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 Chronic hip pain (Bilateral) (L>R)    Pain Score: Pre-procedure: 4 /10 Post-procedure: 2  (SIJI = 1)/10  Pre-op Assessment:  Previous date of service: 12/05/16 Service provided: Med Refill Jacqueline Thornton is a 66 y.o. (year old), female patient, seen today for interventional treatment. She  has a past surgical history that includes Cholecystectomy; Abdominal hysterectomy; Neck surgery (Bilateral); Appendectomy; Neck surgery; Shoulder surgery; and Colonoscopy with propofol (N/A, 07/19/2015). Her primarily concern today is the Hip Pain (both sides)  Initial Vital Signs: Blood pressure 135/66, pulse 84, temperature 98.1 F (36.7 C), temperature source Oral, resp. rate 16, height 5' (1.524 m), weight 143 lb (64.9 kg), SpO2 100 %. BMI: 27.93 kg/m  Risk Assessment: Allergies: Reviewed. She has No Known Allergies.  Allergy Precautions: None required Coagulopathies: Reviewed. None identified.  Blood-thinner therapy: None at this  time Active Infection(s): Reviewed. None identified. Jacqueline Thornton is afebrile  Site Confirmation: Jacqueline Thornton was asked to confirm the procedure and laterality before marking the site Procedure checklist: Completed Consent: Before the procedure and under the influence of no sedative(s), amnesic(s), or anxiolytics, the patient was informed of the treatment options, risks and possible complications. To fulfill our ethical and legal obligations, as recommended by the American Medical Association's Code of Ethics, I have informed the patient of my clinical impression; the nature and purpose of the treatment or procedure; the risks, benefits, and possible complications of the intervention; the alternatives, including doing nothing; the risk(s) and benefit(s) of the alternative treatment(s) or procedure(s); and the risk(s) and benefit(s) of doing nothing. The patient was provided information about the general risks and possible complications associated with the procedure. These may include, but are not limited to: failure to achieve desired goals, infection, bleeding, organ or nerve damage, allergic reactions, paralysis, and death. In addition, the patient was informed of those risks and complications associated to the procedure, such as failure to decrease pain; infection; bleeding; organ or nerve damage with subsequent damage to sensory, motor, and/or autonomic systems, resulting in permanent pain, numbness, and/or weakness of one or several areas of the body; allergic reactions; (i.e.: anaphylactic reaction); and/or death. Furthermore, the patient was informed of those risks and complications associated with the medications. These include, but are not limited to: allergic reactions (i.e.: anaphylactic or anaphylactoid reaction(s)); adrenal axis suppression; blood sugar elevation that in diabetics may result in ketoacidosis or comma; water retention that in patients with history of congestive heart failure may  result in shortness of breath, pulmonary edema, and decompensation with resultant heart failure; weight gain; swelling or edema; medication-induced neural toxicity; particulate matter embolism and blood vessel occlusion with resultant organ, and/or nervous system infarction;  and/or aseptic necrosis of one or more joints. Finally, the patient was informed that Medicine is not an exact science; therefore, there is also the possibility of unforeseen or unpredictable risks and/or possible complications that may result in a catastrophic outcome. The patient indicated having understood very clearly. We have given the patient no guarantees and we have made no promises. Enough time was given to the patient to ask questions, all of which were answered to the patient's satisfaction. Jacqueline Thornton has indicated that she wanted to continue with the procedure. Attestation: I, the ordering provider, attest that I have discussed with the patient the benefits, risks, side-effects, alternatives, likelihood of achieving goals, and potential problems during recovery for the procedure that I have provided informed consent. Date: 12/11/2016; Time: 9:42 AM  Pre-Procedure Preparation:  Monitoring: As per clinic protocol. Respiration, ETCO2, SpO2, BP, heart rate and rhythm monitor placed and checked for adequate function Safety Precautions: Patient was assessed for positional comfort and pressure points before starting the procedure. Time-out: I initiated and conducted the "Time-out" before starting the procedure, as per protocol. The patient was asked to participate by confirming the accuracy of the "Time Out" information. Verification of the correct person, site, and procedure were performed and confirmed by me, the nursing staff, and the patient. "Time-out" conducted as per Joint Commission's Universal Protocol (UP.01.01.01). "Time-out" Date & Time: 12/11/2016; 1027 hrs.  Description of Procedure Procedure #1 Process:  Position:  Lateral Decubitus with bad side up Target Area: Superior aspect of the hip joint cavity, going thru the superior portion of the capsular ligament. Approach: Posterolateral approach. Area Prepped: Entire Posterolateral hip area. Prepping solution: ChloraPrep (2% chlorhexidine gluconate and 70% isopropyl alcohol) Safety Precautions: Aspiration looking for blood return was conducted prior to all injections. At no point did we inject any substances, as a needle was being advanced. No attempts were made at seeking any paresthesias. Safe injection practices and needle disposal techniques used. Medications properly checked for expiration dates. SDV (single dose vial) medications used. Description of the Procedure: Protocol guidelines were followed. The patient was placed in position over the procedure table. The target area was identified and the area prepped in the usual manner. Skin desensitized using vapocoolant spray. Skin & deeper tissues infiltrated with local anesthetic. Appropriate amount of time allowed to pass for local anesthetics to take effect. The procedure needles were then advanced to the target area. Proper needle placement secured. Negative aspiration confirmed. Solution injected in intermittent fashion, asking for systemic symptoms every 0.5cc of injectate. The needles were then removed and the area cleansed, making sure to leave some of the prepping solution back to take advantage of its long term bactericidal properties. Vitals:   12/11/16 1033 12/11/16 1040 12/11/16 1050 12/11/16 1102  BP: 101/63 (!) 85/62 (!) 142/71 115/73  Pulse:      Resp: 10 11 20  (!) 24  Temp:  97.7 F (36.5 C)    TempSrc:  Tympanic    SpO2: 94% 96% 97% 99%  Weight:      Height:        Start Time: 1028 hrs. End Time: 1033 hrs.  Materials:  Needle(s) Type: Regular needle Gauge: 22G Length: 3.5-in Medication(s): We administered lactated ringers, midazolam, fentaNYL, methylPREDNISolone acetate, ropivacaine  (PF) 2 mg/mL (0.2%), lidocaine, methylPREDNISolone acetate, and ropivacaine (PF) 2 mg/mL (0.2%). Please see chart orders for dosing details.  Imaging Guidance Procedure #1 (Non-Spinal):  Type of Imaging Technique: Fluoroscopy Guidance (Non-Spinal) Indication(s): Assistance in needle guidance and placement for  procedures requiring needle placement in or near specific anatomical locations not easily accessible without such assistance. Exposure Time: Please see nurses notes. Contrast: Before injecting any contrast, we confirmed that the patient did not have an allergy to iodine, shellfish, or radiological contrast. Once satisfactory needle placement was completed at the desired level, radiological contrast was injected. Contrast injected under live fluoroscopy. No contrast complications. See chart for type and volume of contrast used. Fluoroscopic Guidance: I was personally present during the use of fluoroscopy. "Tunnel Vision Technique" used to obtain the best possible view of the target area. Parallax error corrected before commencing the procedure. "Direction-depth-direction" technique used to introduce the needle under continuous pulsed fluoroscopy. Once target was reached, antero-posterior, oblique, and lateral fluoroscopic projection used confirm needle placement in all planes. Images permanently stored in EMR. Interpretation: I personally interpreted the imaging intraoperatively. Adequate needle placement confirmed in multiple planes. Appropriate spread of contrast into desired area was observed. No evidence of afferent or efferent intravascular uptake. Permanent images saved into the patient's record.  Procedure #2:  Anesthesia, Analgesia, Anxiolysis:  Type: Palliative Sacroiliac Joint Steroid Injection Region: Superior Lumbosacral Region Level: PSIS (Posterior Superior Iliac Spine) Laterality: Left-Sided  Type: Local Anesthesia with Moderate (Conscious) Sedation Local Anesthetic: Lidocaine  1% Route: Intravenous (IV) IV Access: Secured Sedation: Meaningful verbal contact was maintained at all times during the procedure  Indication(s): Analgesia and Anxiety  Indications: 1. Chronic sacroiliac joint pain (Bilateral) (L>R)    Description of Procedure #2 Process:  Position: Prone Target Area: Superior, posterior, aspect of the sacroiliac fissure Approach: Posterior, paraspinal, ipsilateral approach. Area Prepped: Entire Lower Lumbosacral Region Prepping solution: ChloraPrep (2% chlorhexidine gluconate and 70% isopropyl alcohol) Safety Precautions: Aspiration looking for blood return was conducted prior to all injections. At no point did we inject any substances, as a needle was being advanced. No attempts were made at seeking any paresthesias. Safe injection practices and needle disposal techniques used. Medications properly checked for expiration dates. SDV (single dose vial) medications used. Description of the Procedure: Protocol guidelines were followed. The patient was placed in position over the procedure table. The target area was identified and the area prepped in the usual manner. Skin & deeper tissues infiltrated with local anesthetic. Appropriate amount of time allowed to pass for local anesthetics to take effect. The procedure needle was advanced under fluoroscopic guidance into the sacroiliac joint until a firm endpoint was obtained. Proper needle placement secured. Negative aspiration confirmed. Solution injected in intermittent fashion, asking for systemic symptoms every 0.5cc of injectate. The needles were then removed and the area cleansed, making sure to leave some of the prepping solution back to take advantage of its long term bactericidal properties. Vitals:   12/11/16 1033 12/11/16 1040 12/11/16 1050 12/11/16 1102  BP: 101/63 (!) 85/62 (!) 142/71 115/73  Pulse:      Resp: 10 11 20  (!) 24  Temp:  97.7 F (36.5 C)    TempSrc:  Tympanic    SpO2: 94% 96% 97% 99%   Weight:      Height:       End Time: 1033 hrs. Materials:  Needle(s) Type: Regular needle Gauge: 22G Length: 3.5-in Medication(s): We administered lactated ringers, midazolam, fentaNYL, methylPREDNISolone acetate, ropivacaine (PF) 2 mg/mL (0.2%), lidocaine, methylPREDNISolone acetate, and ropivacaine (PF) 2 mg/mL (0.2%). Please see chart orders for dosing details.  Imaging Guidance Procedure #2 (Non-Spinal):  Type of Imaging Technique: Fluoroscopy Guidance (Non-Spinal) Indication(s): Assistance in needle guidance and placement for procedures requiring needle placement  in or near specific anatomical locations not easily accessible without such assistance. Exposure Time: Please see nurses notes. Contrast: Before injecting any contrast, we confirmed that the patient did not have an allergy to iodine, shellfish, or radiological contrast. Once satisfactory needle placement was completed at the desired level, radiological contrast was injected. Contrast injected under live fluoroscopy. No contrast complications. See chart for type and volume of contrast used. Fluoroscopic Guidance: I was personally present during the use of fluoroscopy. "Tunnel Vision Technique" used to obtain the best possible view of the target area. Parallax error corrected before commencing the procedure. "Direction-depth-direction" technique used to introduce the needle under continuous pulsed fluoroscopy. Once target was reached, antero-posterior, oblique, and lateral fluoroscopic projection used confirm needle placement in all planes. Images permanently stored in EMR. Interpretation: I personally interpreted the imaging intraoperatively. Adequate needle placement confirmed in multiple planes. Appropriate spread of contrast into desired area was observed. No evidence of afferent or efferent intravascular uptake. Permanent images saved into the patient's record.  Antibiotic Prophylaxis:  Indication(s): None identified Antibiotic  given: None  Post-operative Assessment:  EBL: None Complications: No immediate post-treatment complications observed by team, or reported by patient. Note: The patient tolerated the entire procedure well. A repeat set of vitals were taken after the procedure and the patient was kept under observation following institutional policy, for this type of procedure. Post-procedural neurological assessment was performed, showing return to baseline, prior to discharge. The patient was provided with post-procedure discharge instructions, including a section on how to identify potential problems. Should any problems arise concerning this procedure, the patient was given instructions to immediately contact us, at any time, without hesitation. In any case, we plan to contact the patient by telephone for a follow-up status report regarding this interventional procedure. Comments:  No additional relevant information.  Plan of Care  Disposition: Discharge home  Discharge Date & Time: 12/11/2016; 1115 hrs.  Physician-requested Follow-up:  Return for post-procedure eval (in 2 wks), by MD, in addition, Med-Mgmt, by NP.  Future Appointments Date Time Provider [place]  12/31/2016 9:45 AM Milinda Pointer, MD [facility] None  03/11/2017 8:45 AM Vevelyn Francois, NP [facility] None   Medications ordered for procedure: Meds ordered this encounter  Medications  . lactated ringers infusion 1,000 mL  . midazolam (VERSED) 5 MG/5ML injection 1-2 mg    Make sure Flumazenil is available in the pyxis when using this medication. If oversedation occurs, administer 0.2 mg IV over 15 sec. If after 45 sec no response, administer 0.2 mg again over 1 min; may repeat at 1 min intervals; not to exceed 4 doses (1 mg)  . fentaNYL (SUBLIMAZE) injection 25-50 mcg    Make sure Narcan is available in the pyxis when using this medication. In the event of respiratory depression (RR< 8/min): Titrate NARCAN (naloxone) in increments  of 0.1 to 0.2 mg IV at 2-3 minute intervals, until desired degree of reversal.  . methylPREDNISolone acetate (DEPO-MEDROL) injection 40 mg  . ropivacaine (PF) 2 mg/mL (0.2%) (NAROPIN) injection 4 mL  . lidocaine 1.5 % injection 20 mL    From block tray.  . methylPREDNISolone acetate (DEPO-MEDROL) injection 80 mg  . ropivacaine (PF) 2 mg/mL (0.2%) (NAROPIN) injection 9 mL   Medications administered: We administered lactated ringers, midazolam, fentaNYL, methylPREDNISolone acetate, ropivacaine (PF) 2 mg/mL (0.2%), lidocaine, methylPREDNISolone acetate, and ropivacaine (PF) 2 mg/mL (0.2%).  See the medical record for exact dosing, route, and time of administration.  Lab-work, Procedure(s), & Referral(s) Ordered: Orders  Placed This Encounter  Procedures  . HIP INJECTION  . DG C-Arm 1-60 Min-No Report  . Informed Consent Details: Transcribe to consent form and obtain patient signature  . Provider attestation of informed consent for procedure/surgical case  . Verify informed consent  . Discharge instructions  . Follow-up   Imaging Ordered: Results for orders placed in visit on 08/21/16  DG C-Arm 1-60 Min-No Report   Narrative Fluoroscopy was utilized by the requesting physician.  No radiographic  interpretation.    New Prescriptions   No medications on file   Primary Care Physician: Lavera Guise, MD Location: [facility] Note by: Kathlen Brunswick. Dossie Arbour, M.D, DABA, DABAPM, DABPM, DABIPP, FIPP Date: 12/11/2016; Time: 11:32 AM  Disclaimer:  Medicine is not an exact science. The only guarantee in medicine is that nothing is guaranteed. It is important to note that the decision to proceed with this intervention was based on the information collected from the patient. The Data and conclusions were drawn from the patient's questionnaire, the interview, and the physical examination. Because the information was provided in large part by the patient, it cannot be  guaranteed that it has not been purposely or unconsciously manipulated. Every effort has been made to obtain as much relevant data as possible for this evaluation. It is important to note that the conclusions that lead to this procedure are derived in large part from the available data. Always take into account that the treatment will also be dependent on availability of resources and existing treatment guidelines, considered by other Pain Management Practitioners as being common knowledge and practice, at the time of the intervention. For Medico-Legal purposes, it is also important to point out that variation in procedural techniques and pharmacological choices are the acceptable norm. The indications, contraindications, technique, and results of the above procedure should only be interpreted and judged by a Board-Certified Interventional Pain Specialist with extensive familiarity and expertise in the same exact procedure and technique.  Instructions provided at this appointment: Patient Instructions  ____________________________________________________________________________________________  Post-Procedure instructions Instructions:  Apply ice: Fill a plastic sandwich bag with crushed ice. Cover it with a small towel and apply to injection site. Apply for 15 minutes then remove x 15 minutes. Repeat sequence on day of procedure, until you go to bed. The purpose is to minimize swelling and discomfort after procedure.  Apply heat: Apply heat to procedure site starting the day following the procedure. The purpose is to treat any soreness and discomfort from the procedure.  Food intake: Start with clear liquids (like water) and advance to regular food, as tolerated.   Physical activities: Keep activities to a minimum for the first 8 hours after the procedure.   Driving: If you have received any sedation, you are not allowed to drive for 24 hours after your procedure.  Blood thinner: Restart your  blood thinner 6 hours after your procedure. (Only for those taking blood thinners)  Insulin: As soon as you can eat, you may resume your normal dosing schedule. (Only for those taking insulin)  Infection prevention: Keep procedure site clean and dry.  Post-procedure Pain Diary: Extremely important that this be done correctly and accurately. Recorded information will be used to determine the next step in treatment.  Pain evaluated is that of treated area only. Do not include pain from an untreated area.  Complete every hour, on the hour, for the initial 8 hours. Set an alarm to help you do this part accurately.  Do not  go to sleep and have it completed later. It will not be accurate.  Follow-up appointment: Keep your follow-up appointment after the procedure. Usually 2 weeks for most procedures. (6 weeks in the case of radiofrequency.) Bring you pain diary.  Expect:  From numbing medicine (AKA: Local Anesthetics): Numbness or decrease in pain.  Onset: Full effect within 15 minutes of injected.  Duration: It will depend on the type of local anesthetic used. On the average, 1 to 8 hours.   From steroids: Decrease in swelling or inflammation. Once inflammation is improved, relief of the pain will follow.  Onset of benefits: Depends on the amount of swelling present. The more swelling, the longer it will take for the benefits to be seen.   Duration: Steroids will stay in the system x 2 weeks. Duration of benefits will depend on multiple posibilities including persistent irritating factors.  From procedure: Some discomfort is to be expected once the numbing medicine wears off. This should be minimal if ice and heat are applied as instructed. Call if:  You experience numbness and weakness that gets worse with time, as opposed to wearing off.  New onset bowel or bladder incontinence. (Spinal procedures only)  Emergency Numbers:  Durning business hours (Monday - Thursday, 8:00 AM - 4:00  PM) (Friday, 9:00 AM - 12:00 Noon): [phone]  After hours: [phone] ____________________________________________________________________________________________  Pain Management Discharge Instructions  General Discharge Instructions :  If you need to reach your doctor call: Monday-Friday 8:00 am - 4:00 pm at [phone] or toll free [phone].  After clinic hours [phone] to have operator reach doctor.  Bring all of your medication bottles to all your appointments in the pain clinic.  To cancel or reschedule your appointment with Pain Management please remember to call 24 hours in advance to avoid a fee.  Refer to the educational materials which you have been given on: General Risks, I had my Procedure. Discharge Instructions, Post Sedation.  Post Procedure Instructions:  The drugs you were given will stay in your system until tomorrow, so for the next 24 hours you should not drive, make any legal decisions or drink any alcoholic beverages.  You may eat anything you prefer, but it is better to start with liquids then soups and crackers, and gradually work up to solid foods.  Please notify your doctor immediately if you have any unusual bleeding, trouble breathing or pain that is not related to your normal pain.  Depending on the type of procedure that was done, some parts of your body may feel week and/or numb.  This usually clears up by tonight or the next day.  Walk with the use of an assistive device or accompanied by an adult for the 24 hours.  You may use ice on the affected area for the first 24 hours.  Put ice in a Ziploc bag and cover with a towel and place against area 15 minutes on 15 minutes off.  You may switch to heat after 24 hours.

## 2016-12-12 ENCOUNTER — Telehealth: Payer: Self-pay | Admitting: *Deleted

## 2016-12-12 NOTE — Telephone Encounter (Signed)
Attempted to call patient for post procedure follow-up. Message left. 

## 2016-12-23 DIAGNOSIS — Z961 Presence of intraocular lens: Secondary | ICD-10-CM | POA: Insufficient documentation

## 2016-12-24 DIAGNOSIS — H25042 Posterior subcapsular polar age-related cataract, left eye: Secondary | ICD-10-CM | POA: Insufficient documentation

## 2016-12-24 DIAGNOSIS — H2512 Age-related nuclear cataract, left eye: Secondary | ICD-10-CM | POA: Insufficient documentation

## 2016-12-31 ENCOUNTER — Ambulatory Visit: Payer: Medicare HMO | Admitting: Pain Medicine

## 2017-01-07 NOTE — Telephone Encounter (Signed)
NA

## 2017-01-30 ENCOUNTER — Telehealth: Payer: Self-pay | Admitting: Pain Medicine

## 2017-01-30 NOTE — Telephone Encounter (Signed)
Patient called with right facet area pain, would like to have procedure, and left Sacrilliac area also.  Please call patient to discuss what procedure she can have. Let Blanch Media know so she can check for prior auth

## 2017-01-30 NOTE — Telephone Encounter (Signed)
Patient did not have a post procedure evaluation after her last procedure in mid June. Therefore she understands that she will need to come in for evaluation prior to scheduling a procedure.[name] phone call placed with Blanch Media in order to make an appointment for evaluation.

## 2017-02-04 ENCOUNTER — Ambulatory Visit: Payer: Medicare HMO | Admitting: Pain Medicine

## 2017-02-20 ENCOUNTER — Encounter: Payer: Self-pay | Admitting: Pain Medicine

## 2017-02-20 ENCOUNTER — Ambulatory Visit: Payer: Medicare Other | Attending: Pain Medicine | Admitting: Pain Medicine

## 2017-02-20 VITALS — BP 112/64 | HR 80 | Temp 98.2°F | Resp 16 | Ht 60.0 in | Wt 143.0 lb

## 2017-02-20 DIAGNOSIS — M7071 Other bursitis of hip, right hip: Secondary | ICD-10-CM | POA: Diagnosis not present

## 2017-02-20 DIAGNOSIS — M7061 Trochanteric bursitis, right hip: Secondary | ICD-10-CM | POA: Diagnosis not present

## 2017-02-20 DIAGNOSIS — M25552 Pain in left hip: Secondary | ICD-10-CM | POA: Diagnosis not present

## 2017-02-20 DIAGNOSIS — F410 Panic disorder [episodic paroxysmal anxiety] without agoraphobia: Secondary | ICD-10-CM | POA: Diagnosis not present

## 2017-02-20 DIAGNOSIS — M545 Low back pain: Secondary | ICD-10-CM | POA: Diagnosis not present

## 2017-02-20 DIAGNOSIS — Z87891 Personal history of nicotine dependence: Secondary | ICD-10-CM | POA: Insufficient documentation

## 2017-02-20 DIAGNOSIS — G501 Atypical facial pain: Secondary | ICD-10-CM | POA: Insufficient documentation

## 2017-02-20 DIAGNOSIS — I25119 Atherosclerotic heart disease of native coronary artery with unspecified angina pectoris: Secondary | ICD-10-CM | POA: Diagnosis not present

## 2017-02-20 DIAGNOSIS — M47896 Other spondylosis, lumbar region: Secondary | ICD-10-CM | POA: Diagnosis not present

## 2017-02-20 DIAGNOSIS — Z79891 Long term (current) use of opiate analgesic: Secondary | ICD-10-CM | POA: Insufficient documentation

## 2017-02-20 DIAGNOSIS — M47816 Spondylosis without myelopathy or radiculopathy, lumbar region: Secondary | ICD-10-CM

## 2017-02-20 DIAGNOSIS — M47892 Other spondylosis, cervical region: Secondary | ICD-10-CM | POA: Insufficient documentation

## 2017-02-20 DIAGNOSIS — M791 Myalgia: Secondary | ICD-10-CM | POA: Insufficient documentation

## 2017-02-20 DIAGNOSIS — M25551 Pain in right hip: Secondary | ICD-10-CM | POA: Diagnosis not present

## 2017-02-20 DIAGNOSIS — M7918 Myalgia, other site: Secondary | ICD-10-CM

## 2017-02-20 DIAGNOSIS — G8929 Other chronic pain: Secondary | ICD-10-CM | POA: Diagnosis not present

## 2017-02-20 DIAGNOSIS — Z79899 Other long term (current) drug therapy: Secondary | ICD-10-CM | POA: Insufficient documentation

## 2017-02-20 DIAGNOSIS — D649 Anemia, unspecified: Secondary | ICD-10-CM | POA: Diagnosis not present

## 2017-02-20 DIAGNOSIS — M7072 Other bursitis of hip, left hip: Secondary | ICD-10-CM | POA: Insufficient documentation

## 2017-02-20 DIAGNOSIS — G894 Chronic pain syndrome: Secondary | ICD-10-CM | POA: Insufficient documentation

## 2017-02-20 DIAGNOSIS — K219 Gastro-esophageal reflux disease without esophagitis: Secondary | ICD-10-CM | POA: Diagnosis not present

## 2017-02-20 DIAGNOSIS — M533 Sacrococcygeal disorders, not elsewhere classified: Secondary | ICD-10-CM | POA: Diagnosis not present

## 2017-02-20 DIAGNOSIS — F411 Generalized anxiety disorder: Secondary | ICD-10-CM | POA: Insufficient documentation

## 2017-02-20 DIAGNOSIS — E785 Hyperlipidemia, unspecified: Secondary | ICD-10-CM | POA: Diagnosis not present

## 2017-02-20 DIAGNOSIS — H2512 Age-related nuclear cataract, left eye: Secondary | ICD-10-CM | POA: Insufficient documentation

## 2017-02-20 DIAGNOSIS — M8938 Hypertrophy of bone, other site: Secondary | ICD-10-CM | POA: Insufficient documentation

## 2017-02-20 DIAGNOSIS — M7062 Trochanteric bursitis, left hip: Secondary | ICD-10-CM

## 2017-02-20 DIAGNOSIS — M4696 Unspecified inflammatory spondylopathy, lumbar region: Secondary | ICD-10-CM | POA: Diagnosis not present

## 2017-02-20 DIAGNOSIS — G43909 Migraine, unspecified, not intractable, without status migrainosus: Secondary | ICD-10-CM | POA: Diagnosis not present

## 2017-02-20 DIAGNOSIS — H25049 Posterior subcapsular polar age-related cataract, unspecified eye: Secondary | ICD-10-CM | POA: Diagnosis not present

## 2017-02-20 DIAGNOSIS — Z961 Presence of intraocular lens: Secondary | ICD-10-CM | POA: Insufficient documentation

## 2017-02-20 DIAGNOSIS — Z981 Arthrodesis status: Secondary | ICD-10-CM | POA: Diagnosis not present

## 2017-02-20 DIAGNOSIS — Z5181 Encounter for therapeutic drug level monitoring: Secondary | ICD-10-CM | POA: Diagnosis not present

## 2017-02-20 MED ORDER — CYCLOBENZAPRINE HCL 10 MG PO TABS: 10.0000 mg | ORAL_TABLET | Freq: Three times a day (TID) | ORAL | 0 refills | Status: DC | PRN

## 2017-02-20 NOTE — Progress Notes (Signed)
Safety precautions to be maintained throughout the outpatient stay will include: orient to surroundings, keep bed in low position, maintain call bell within reach at all times, provide assistance with transfer out of bed and ambulation.  

## 2017-02-20 NOTE — Patient Instructions (Addendum)
____________________________________________________________________________________________  Preparing for Procedure with Sedation Instructions: . Oral Intake: Do not eat or drink anything for at least 8 hours prior to your procedure. . Transportation: Public transportation is not allowed. Bring an adult driver. The driver must be physically present in our waiting room before any procedure can be started. Marland Kitchen Physical Assistance: Bring an adult physically capable of assisting you, in the event you need help. This adult should keep you company at home for at least 6 hours after the procedure. . Blood Pressure Medicine: Take your blood pressure medicine with a sip of water the morning of the procedure. . Blood thinners:  . Diabetics on insulin: Notify the staff so that you can be scheduled 1st case in the morning. If your diabetes requires high dose insulin, take only  of your normal insulin dose the morning of the procedure and notify the staff that you have done so. . Preventing infections: Shower with an antibacterial soap the morning of your procedure. . Build-up your immune system: Take 1000 mg of Vitamin C with every meal (3 times a day) the day prior to your procedure. Marland Kitchen Antibiotics: Inform the staff if you have a condition or reason that requires you to take antibiotics before dental procedures. . Pregnancy: If you are pregnant, call and cancel the procedure. . Sickness: If you have a cold, fever, or any active infections, call and cancel the procedure. . Arrival: You must be in the facility at least 30 minutes prior to your scheduled procedure. . Children: Do not bring children with you. . Dress appropriately: Bring dark clothing that you would not mind if they get stained. . Valuables: Do not bring any jewelry or valuables. Procedure appointments are reserved for interventional treatments only. Marland Kitchen No Prescription Refills. . No medication changes will be discussed during procedure  appointments. . No disability issues will be discussed. ____________________________________________________________________________________________  Preparing for Procedure with Sedation Instructions: . Oral Intake: Do not eat or drink anything for at least 8 hours prior to your procedure. . Transportation: Public transportation is not allowed. Bring an adult driver. The driver must be physically present in our waiting room before any procedure can be started. Marland Kitchen Physical Assistance: Bring an adult capable of physically assisting you, in the event you need help. . Blood Pressure Medicine: Take your blood pressure medicine with a sip of water the morning of the procedure. . Insulin: Take only  of your normal insulin dose. . Preventing infections: Shower with an antibacterial soap the morning of your procedure. . Build-up your immune system: Take 1000 mg of Vitamin C with every meal (3 times a day) the day prior to your procedure. . Pregnancy: If you are pregnant, call and cancel the procedure. . Sickness: If you have a cold, fever, or any active infections, call and cancel the procedure. . Arrival: You must be in the facility at least 30 minutes prior to your scheduled procedure. . Children: Do not bring children with you. . Dress appropriately: Bring dark clothing that you would not mind if they get stained. . Valuables: Do not bring any jewelry or valuables. Procedure appointments are reserved for interventional treatments only. Marland Kitchen No Prescription Refills. . No medication changes will be discussed during procedure appointments. No disability issues will be discussed.Sacroiliac (SI) Joint Injection Patient Information  Description: The sacroiliac joint connects the scrum (very low back and tailbone) to the ilium (a pelvic bone which also forms half of the hip joint).  Normally this joint experiences very  little motion.  When this joint becomes inflamed or unstable low back and or hip and  pelvis pain may result.  Injection of this joint with local anesthetics (numbing medicines) and steroids can provide diagnostic information and reduce pain.  This injection is performed with the aid of x-ray guidance into the tailbone area while you are lying on your stomach.   You may experience an electrical sensation down the leg while this is being done.  You may also experience numbness.  We also may ask if we are reproducing your normal pain during the injection.  Conditions which may be treated SI injection:  Low back, buttock, hip or leg pain  Preparation for the Injection:  Do not eat any solid food or dairy products within 8 hours of your appointment.  You may drink clear liquids up to 3 hours before appointment.  Clear liquids include water, black coffee, juice or soda.  No milk or cream please. You may take your regular medications, including pain medications with a sip of water before your appointment.  Diabetics should hold regular insulin (if take separately) and take 1/2 normal NPH dose the morning of the procedure.  Carry some sugar containing items with you to your appointment. A driver must accompany you and be prepared to drive you home after your procedure. Bring all of your current medications with you. An IV may be inserted and sedation may be given at the discretion of the physician. A blood pressure cuff, EKG and other monitors will often be applied during the procedure.  Some patients may need to have extra oxygen administered for a short period.  You will be asked to provide medical information, including your allergies, prior to the procedure.  We must know immediately if you are taking blood thinners (like Coumadin/Warfarin) or if you are allergic to IV iodine contrast (dye).  We must know if you could possible be pregnant.  Possible side effects:  Bleeding from needle site Infection (rare, may require surgery) Nerve injury (rare) Numbness & tingling (temporary) A  brief convulsion or seizure Light-headedness (temporary) Pain at injection site (several days) Decreased blood pressure (temporary) Weakness in the leg (temporary)   Call if you experience:  New onset weakness or numbness of an extremity below the injection site that last more than 8 hours. Hives or difficulty breathing ( go to the emergency room) Inflammation or drainage at the injection site Any new symptoms which are concerning to you  Please note:  Although the local anesthetic injected can often make your back/ hip/ buttock/ leg feel good for several hours after the injections, the pain will likely return.  It takes 3-7 days for steroids to work in the sacroiliac area.  You may not notice any pain relief for at least that one week.  If effective, we will often do a series of three injections spaced 3-6 weeks apart to maximally decrease your pain.  After the initial series, we generally will wait some months before a repeat injection of the same type.  If you have any questions, please call [phone] [facility]  Facet Blocks Patient Information  Description: The facets are joints in the spine between the vertebrae.  Like any joints in the body, facets can become irritated and painful.  Arthritis can also effect the facets.  By injecting steroids and local anesthetic in and around these joints, we can temporarily block the nerve supply to them.  Steroids act directly on irritated  nerves and tissues to reduce selling and inflammation which often leads to decreased pain.  Facet blocks may be done anywhere along the spine from the neck to the low back depending upon the location of your pain.   After numbing the skin with local anesthetic (like Novocaine), a small needle is passed onto the facet joints under x-ray guidance.  You may experience a sensation of pressure while this is being done.  The entire block usually lasts about 15-25 minutes.    Conditions which may be treated by facet blocks:  Low back/buttock pain Neck/shoulder pain Certain types of headaches  Preparation for the injection:  Do not eat any solid food or dairy products within 8 hours of your appointment. You may drink clear liquid up to 3 hours before appointment.  Clear liquids include water, black coffee, juice or soda.  No milk or cream please. You may take your regular medication, including pain medications, with a sip of water before your appointment.  Diabetics should hold regular insulin (if taken separately) and take 1/2 normal NPH dose the morning of the procedure.  Carry some sugar containing items with you to your appointment. A driver must accompany you and be prepared to drive you home after your procedure. Bring all your current medications with you. An IV may be inserted and sedation may be given at the discretion of the physician. A blood pressure cuff, EKG and other monitors will often be applied during the procedure.  Some patients may need to have extra oxygen administered for a short period. You will be asked to provide medical information, including your allergies and medications, prior to the procedure.  We must know immediately if you are taking blood thinners (like Coumadin/Warfarin) or if you are allergic to IV iodine contrast (dye).  We must know if you could possible be pregnant.  Possible side-effects:  Bleeding from needle site Infection (rare, may require surgery) Nerve injury (rare) Numbness & tingling (temporary) Difficulty urinating (rare, temporary) Spinal headache (a headache worse with upright posture) Light-headedness (temporary) Pain at injection site (serveral days) Decreased blood pressure (rare, temporary) Weakness in arm/leg (temporary) Pressure sensation in back/neck (temporary)   Call if you experience:  Fever/chills associated with headache or increased back/neck pain Headache worsened by an upright  position New onset, weakness or numbness of an extremity below the injection site Hives or difficulty breathing (go to the emergency room) Inflammation or drainage at the injection site(s) Severe back/neck pain greater than usual New symptoms which are concerning to you  Please note:  Although the local anesthetic injected can often make your back or neck feel good for several hours after the injection, the pain will likely return. It takes 3-7 days for steroids to work.  You may not notice any pain relief for at least one week.  If effective, we will often do a series of 2-3 injections spaced 3-6 weeks apart to maximally decrease your pain.  After the initial series, you may be a candidate for a more permanent nerve block of the facets.  If you have any questions, please call #[phone] . [facility]

## 2017-02-20 NOTE — Progress Notes (Signed)
Patient's Name: Jacqueline Thornton  MRN: [id number]  Referring Provider: Lavera Guise, MD  DOB: [birth year]  PCP: Lavera Guise, MD  DOS: 02/20/2017  Note by: Gaspar Cola, MD  Service setting: Ambulatory outpatient  Specialty: Interventional Pain Management  Location: [facility] (AMB) Pain Management Facility    Patient type: Established   Primary Reason(s) for Visit: Encounter for post-procedure evaluation of chronic illness with mild to moderate exacerbation CC: Back Pain (low)  HPI  [name] is a 66 y.o. year old, female patient, who comes today for a post-procedure evaluation. She has Chronic pain syndrome; Gout; HLD (hyperlipidemia); Headache, migraine; Panic attack; Chronic low back pain (Location of Secondary source of pain) (Bilateral) (midline to tailbone) (R>L); Spondylosis of lumbar spine;  Lumbar annular disc tear (L4-5); Discogenic low back pain (L3-4 and L4-5); Lumbar facet hypertrophy; Lumbar facet syndrome (Bilateral) (R>L); Chronic neck pain; Cervical spondylosis; Hx of cervical spine surgery; Cervical spinal fusion (C6-7 interbody fusion); Coronary atherosclerosis; Long term current use of opiate analgesic; Encounter for therapeutic drug level monitoring; Uncomplicated opioid dependence ([place]); Opiate use (56.25 MME/Day); CAD in native artery; Anxiety, generalized; Chronic sacroiliac joint pain (Bilateral) (L>R); Chronic lumbar radicular pain (Location of Primary Source of Pain) (Left) (S1 Dermatome); Long term prescription opiate use; Encounter for chronic pain management; Trochanteric bursitis of right hip; Chronic hip pain (Bilateral) (L>R); Neurogenic pain; Myofascial pain; Osteoarthritis of hip (Left); Arthrodesis status; Degenerative disk disease; Myalgia; Trochanteric bursitis of hip (Bilateral) (L>R); Age-related nuclear cataract of left eye; Posterior subcapsular polar age-related cataract of left eye; and Pseudophakia of right eye on her problem list. Her primarily concern today  is the Back Pain (low)  Pain Assessment: Location: Lower, Right, Left Back Onset: More than a month ago Duration: Chronic pain Quality: Stabbing, Aching (right side is stabbing, left side is a deep ache) Severity: 4 /10 (self-reported pain score)  Note: Reported level is compatible with observation.                   Timing: Constant  [name] comes in today for post-procedure evaluation after the treatment done on 01/30/2017. The patient has experienced 90% benefit in the treated area. However, she is having some recurrence of her left sacroiliac joint pain and right lumbar facet joint pain. We will address this next period  Further details on both, my assessment(s), as well as the proposed treatment plan, please see below.  Post-Procedure Assessment  01/30/2017 Procedure: Palliative Bilateral Superficial Trochanteric Bursa Injection Pre-procedure pain score:  4/10 Post-procedure pain score: 2/10 (> 50% relief) Influential Factors: BMI: 27.93 kg/m Intra-procedural challenges: None observed.         Assessment challenges: None detected.              Reported side-effects: None.        Post-procedural adverse reactions or complications: None reported         Sedation: Sedation provided. When no sedatives are used, the analgesic levels obtained are directly associated to the effectiveness of the local anesthetics. However, when sedation is provided, the level of analgesia obtained during the initial 1 hour following the intervention, is believed to be the result of a combination of factors. These factors may include, but are not limited to: 1. The effectiveness of the local anesthetics used. 2. The effects of the analgesic(s) and/or anxiolytic(s) used. 3. The degree of discomfort experienced by the patient at the time of the procedure. 4. The patients ability and reliability in  recalling and recording the events. 5. The presence and influence of possible secondary gains and/or  psychosocial factors. Reported result: Relief experienced during the 1st hour after the procedure: 100 % (Ultra-Short Term Relief)            Interpretative annotation: Clinically appropriate result. Analgesia during this period is likely to be Local Anesthetic and/or IV Sedative (Analgesic/Anxiolytic) related.          Effects of local anesthetic: The analgesic effects attained during this period are directly associated to the localized infiltration of local anesthetics and therefore cary significant diagnostic value as to the etiological location, or anatomical origin, of the pain. Expected duration of relief is directly dependent on the pharmacodynamics of the local anesthetic used. Long-acting (4-6 hours) anesthetics used.  Reported result: Relief during the next 4 to 6 hour after the procedure: 100 % (Short-Term Relief)            Interpretative annotation: Clinically appropriate result. Analgesia during this period is likely to be Local Anesthetic-related.          Long-term benefit: Defined as the period of time past the expected duration of local anesthetics (1 hour for short-acting and 4-6 hours for long-acting). With the possible exception of prolonged sympathetic blockade from the local anesthetics, benefits during this period are typically attributed to, or associated with, other factors such as analgesic sensory neuropraxia, antiinflammatory effects, or beneficial biochemical changes provided by agents other than the local anesthetics.  Reported result: Extended relief following procedure: 90 % (ongoing) (Long-Term Relief) [name] reports the extremity pain improved more than the axial pain. Interpretative annotation: Clinically appropriate result. Good relief. Therapeutic success. Inflammation plays a part in the etiology to the pain. Benefit believed to be steroid-related.  Current benefits: Defined as persistent relief that continues at this point in time.   Reported results: Treated  area: 90 % benefit in terms of the pain in the area of the hips. However, she still having pain in the left sacroiliac joint area and right lumbar facet area. [name] reports improvement in function Interpretative annotation: Clinically significant results. Therapeutic success. Effective diagnostic intervention. Benefit believed to be steroid-related.  Interpretation: Results would suggest therapy to have a positive impact on the patient's condition.                  Plan:  Set up procedure as a PRN palliative treatment option for this patient.  Laboratory Chemistry  Inflammation Markers (CRP: Acute Phase) (ESR: Chronic Phase) Lab Results  Component Value Date   CRP 4.8 12/05/2016   ESRSEDRATE 2 12/05/2016                 Renal Function Markers Lab Results  Component Value Date   BUN 8 12/05/2016   CREATININE 0.88 12/05/2016   GFRAA 80 12/05/2016   GFRNONAA 70 12/05/2016                 Hepatic Function Markers Lab Results  Component Value Date   AST 19 12/05/2016   ALT 13 12/05/2016   ALBUMIN 3.7 12/05/2016   ALKPHOS 48 12/05/2016                 Electrolytes Lab Results  Component Value Date   NA 140 12/05/2016   K 4.0 12/05/2016   CL 103 12/05/2016   CALCIUM 9.3 12/05/2016   MG 2.2 12/05/2016  Neuropathy Markers Lab Results  Component Value Date   VITAMINB12 202 (L) 12/05/2016                 Bone Pathology Markers Lab Results  Component Value Date   ALKPHOS 48 12/05/2016   25OHVITD1 50 12/05/2016   25OHVITD2 <1.0 12/05/2016   25OHVITD3 49 12/05/2016   CALCIUM 9.3 12/05/2016                 Coagulation Parameters Lab Results  Component Value Date   INR 1.0 03/02/2014   LABPROT 12.7 03/02/2014   PLT 197 01/26/2012                 Cardiovascular Markers Lab Results  Component Value Date   HGB 14.2 01/26/2012   HCT 41.1 01/26/2012                 Note: Lab results reviewed.  Recent Diagnostic Imaging Review  Dg C-arm  1-60 Min-no Report  Result Date: 12/11/2016 Fluoroscopy was utilized by the requesting physician.  No radiographic interpretation.   Note: Imaging results reviewed.          Meds   Current Meds  Medication Sig  . acetaminophen (TYLENOL) 325 MG tablet Take by mouth.  Marland Kitchen alendronate (FOSAMAX) 35 MG tablet Take 35 mg by mouth every 7 (seven) days. Take with a full glass of water on an empty stomach.  . Bromfenac Sodium 0.075 % SOLN Apply to eye.  . cholecalciferol (VITAMIN D) 400 UNITS TABS tablet Take 1,000 Units by mouth daily.  . cyclobenzaprine (FLEXERIL) 10 MG tablet Take 1 tablet (10 mg total) by mouth 3 (three) times daily as needed for muscle spasms.  . Difluprednate 0.05 % EMUL Apply to eye.  . DULoxetine (CYMBALTA) 60 MG capsule Take 60 mg by mouth daily.   . enalapril (VASOTEC) 5 MG tablet Take 5 mg by mouth daily.  Marland Kitchen esomeprazole (NEXIUM) 20 MG capsule Take 20 mg by mouth daily at 12 noon.  Marland Kitchen estradiol (ESTRACE) 0.5 MG tablet Take 0.5 mg by mouth daily.   . Fe Fum-FA-B Cmp-C-Zn-Mg-Mn-Cu (FERROCITE PLUS PO) Take 1 tablet by mouth daily.  . fenofibrate 54 MG tablet Take by mouth.  . gabapentin (NEURONTIN) 300 MG capsule Take 1 capsule (300 mg total) by mouth 2 (two) times daily.  . Magnesium Oxide, Antacid, 500 MG CAPS Take by mouth.  Marland Kitchen ofloxacin (OCUFLOX) 0.3 % ophthalmic solution Apply to eye.  Marland Kitchen oxyCODONE-acetaminophen (PERCOCET) 7.5-325 MG tablet Take 1 tablet by mouth 5 (five) times daily as needed for severe pain.  Derrill Memo ON 02/24/2017] oxyCODONE-acetaminophen (PERCOCET) 7.5-325 MG tablet Take 1 tablet by mouth 5 (five) times daily as needed for severe pain.  . rosuvastatin (CRESTOR) 5 MG tablet Take 5 mg by mouth every other day.   . [DISCONTINUED] cyclobenzaprine (FLEXERIL) 10 MG tablet Take 1 tablet (10 mg total) by mouth 3 (three) times daily as needed for muscle spasms.    ROS  Constitutional: Denies any fever or chills Gastrointestinal: No reported hemesis,  hematochezia, vomiting, or acute GI distress Musculoskeletal: Denies any acute onset joint swelling, redness, loss of ROM, or weakness Neurological: No reported episodes of acute onset apraxia, aphasia, dysarthria, agnosia, amnesia, paralysis, loss of coordination, or loss of consciousness  Allergies  Ms. Pavon has No Known Allergies.  [place]  Drug: Ms. Nikolic  reports that she does not use drugs. Alcohol:  reports that she does not drink alcohol. Tobacco:  reports that  she has quit smoking. She has never used smokeless tobacco. Medical:  has a past medical history of Absolute anemia (04/11/2015); Angina pectoris ([place]); Anxiety; Arthritis; Arthropathy of sacroiliac joint ([place]) (04/11/2015); Asthma; Atypical face pain (04/24/2015); CAD (coronary artery disease); Chronic back pain; Depression; Fibromyalgia; GERD (gastroesophageal reflux disease); 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) (04/11/2015); Hyperlipidemia; Hypertension; Low back pain (04/06/2015); Lumbar radicular pain (04/18/2015); Migraine; Narrowing of intervertebral disc space (04/11/2015); Sacroiliac joint pain (04/11/2015); and Spine disorder. Surgical: Ms. Aki  has a past surgical history that includes Cholecystectomy; Abdominal hysterectomy; Neck surgery (Bilateral); Appendectomy; Neck surgery; Shoulder surgery; and Colonoscopy with propofol (N/A, 07/19/2015). Family: family history includes Diabetes in her father and mother; Hypertension in her sister; Stroke in her mother.  Constitutional Exam  General appearance: Well nourished, well developed, and well hydrated. In no apparent acute distress Vitals:   02/20/17 1404  BP: 112/64  Pulse: 80  Resp: 16  Temp: 98.2 F (36.8 C)  SpO2: 100%  Weight: 143 lb (64.9 kg)  Height: 5' (1.524 m)   BMI Assessment: Estimated body mass index is 27.93 kg/m as calculated from the following:   Height as of this encounter: 5' (1.524 m).   Weight as of this encounter: 143 lb (64.9  kg).  BMI interpretation table: BMI level Category Range association with higher incidence of chronic pain  <18 kg/m2 Underweight   18.5-24.9 kg/m2 Ideal body weight   25-29.9 kg/m2 Overweight Increased incidence by 20%  30-34.9 kg/m2 Obese (Class I) Increased incidence by 68%  35-39.9 kg/m2 Severe obesity (Class II) Increased incidence by 136%  >40 kg/m2 Extreme obesity (Class III) Increased incidence by 254%   BMI Readings from Last 4 Encounters:  02/20/17 27.93 kg/m  12/11/16 27.93 kg/m  12/05/16 27.93 kg/m  09/12/16 27.15 kg/m   Wt Readings from Last 4 Encounters:  02/20/17 143 lb (64.9 kg)  12/11/16 143 lb (64.9 kg)  12/05/16 143 lb (64.9 kg)  09/12/16 139 lb (63 kg)  Psych/Mental status: Alert, oriented x 3 (person, place, & time)       Eyes: PERLA Respiratory: No evidence of acute respiratory distress  Cervical Spine Area Exam  Skin & Axial Inspection: No masses, redness, edema, swelling, or associated skin lesions Alignment: Symmetrical Functional ROM: Unrestricted ROM      Stability: No instability detected Muscle Tone/Strength: Functionally intact. No obvious neuro-muscular anomalies detected. Sensory (Neurological): Unimpaired Palpation: No palpable anomalies              Upper Extremity (UE) Exam    Side: Right upper extremity  Side: Left upper extremity  Skin & Extremity Inspection: Skin color, temperature, and hair growth are WNL. No peripheral edema or cyanosis. No masses, redness, swelling, asymmetry, or associated skin lesions. No contractures.  Skin & Extremity Inspection: Skin color, temperature, and hair growth are WNL. No peripheral edema or cyanosis. No masses, redness, swelling, asymmetry, or associated skin lesions. No contractures.  Functional ROM: Unrestricted ROM          Functional ROM: Unrestricted ROM          Muscle Tone/Strength: Functionally intact. No obvious neuro-muscular anomalies detected.  Muscle Tone/Strength: Functionally intact. No  obvious neuro-muscular anomalies detected.  Sensory (Neurological): Unimpaired  Sensory (Neurological): Unimpaired  Palpation: No palpable anomalies              Palpation: No palpable anomalies              Specialized Test(s): Deferred  Specialized Test(s): Deferred          Thoracic Spine Area Exam  Skin & Axial Inspection: No masses, redness, or swelling Alignment: Symmetrical Functional ROM: Unrestricted ROM Stability: No instability detected Muscle Tone/Strength: Functionally intact. No obvious neuro-muscular anomalies detected. Sensory (Neurological): Unimpaired Muscle strength & Tone: No palpable anomalies  Lumbar Spine Area Exam  Skin & Axial Inspection: No masses, redness, or swelling Alignment: Symmetrical Functional ROM: Decreased ROM      Stability: No instability detected Muscle Tone/Strength: Functionally intact. No obvious neuro-muscular anomalies detected. Sensory (Neurological): Movement-associated pain Palpation: Complains of area being tender to palpation       Provocative Tests: Lumbar Hyperextension and rotation test: Positive bilaterally for facet joint pain.  (R>L) Lumbar Lateral bending test: evaluation deferred today       Patrick's Maneuver: Positive for bilateral S-I arthralgia  (L>R)              Gait & Posture Assessment  Ambulation: Limited Gait: Antalgic Posture: Antalgic   Lower Extremity Exam    Side: Right lower extremity  Side: Left lower extremity  Skin & Extremity Inspection: Skin color, temperature, and hair growth are WNL. No peripheral edema or cyanosis. No masses, redness, swelling, asymmetry, or associated skin lesions. No contractures.  Skin & Extremity Inspection: Skin color, temperature, and hair growth are WNL. No peripheral edema or cyanosis. No masses, redness, swelling, asymmetry, or associated skin lesions. No contractures.  Functional ROM: Decreased ROM for hip and knee joints  Functional ROM: Decreased ROM for hip and knee  joints  Muscle Tone/Strength: Functionally intact. No obvious neuro-muscular anomalies detected.  Muscle Tone/Strength: Functionally intact. No obvious neuro-muscular anomalies detected.  Sensory (Neurological): Improved  Sensory (Neurological): Unimpaired  Palpation: No palpable anomalies  Palpation: No palpable anomalies   Assessment  Primary Diagnosis & Pertinent Problem List: The primary encounter diagnosis was Lumbar facet syndrome (Bilateral) (R>L). Diagnoses of Lumbar facet hypertrophy, Chronic sacroiliac joint pain (Bilateral) (L>R), Chronic low back pain (Location of Secondary source of pain) (Bilateral) (midline to tailbone) (R>L), Chronic hip pain (Bilateral) (L>R), Trochanteric bursitis of hip (Bilateral) (L>R), and Myofascial pain were also pertinent to this visit.  Status Diagnosis  Recurring Stable Recurring 1. Lumbar facet syndrome (Bilateral) (R>L)   2. Lumbar facet hypertrophy   3. Chronic sacroiliac joint pain (Bilateral) (L>R)   4. Chronic low back pain (Location of Secondary source of pain) (Bilateral) (midline to tailbone) (R>L)   5. Chronic hip pain (Bilateral) (L>R)   6. Trochanteric bursitis of hip (Bilateral) (L>R) (Improved)      Problems updated and reviewed during this visit: Problem  Degenerative Disk Disease  Myalgia  Headache, Migraine  Arthrodesis Status   Overview:  Overview:  C6-7 interbody fusion.   Age-Related Nuclear Cataract of Left Eye  Posterior Subcapsular Polar Age-Related Cataract of Left Eye  Pseudophakia of Right Eye   Overview:  S/p Phaco/LRI/IOL OD with Alcon AU00T0 20.5D, two 30 LRI_0 /273, wound 180 closed with Resure, on 12/23/2016, Verion, ORA, DUEC [place]   Hld (Hyperlipidemia)  Panic Attack  Coronary Atherosclerosis  Cad in Native Artery  Anxiety, Generalized  Absolute Anemia (Resolved)   Plan of Care  Pharmacotherapy (Medications Ordered): Meds ordered this encounter  Medications  . cyclobenzaprine (FLEXERIL) 10 MG  tablet    Sig: Take 1 tablet (10 mg total) by mouth 3 (three) times daily as needed for muscle spasms.    Dispense:  270 tablet    Refill:  0  Do not place this medication, or any other prescription from our practice, on "Automatic Refill". Patient may have prescription filled one day early if pharmacy is closed on scheduled refill date.   New Prescriptions   No medications on file   Medications administered today: Ms. Schnake had no medications administered during this visit.   Procedure Orders     LUMBAR FACET(MEDIAL BRANCH NERVE BLOCK) MBNB     SACROILIAC JOINT INJECTINS Lab Orders  No laboratory test(s) ordered today   Imaging Orders  No imaging studies ordered today   Referral Orders  No referral(s) requested today    Interventional management options: Planned, scheduled, and/or pending:   Palliative bilateral sacroiliac joint block + right sided lumbar facet block under fluoroscopic guidance and IV sedation    Considering:   Diagnostic caudal epidural steroid injection + diagnostic epidurogram Palliative Right sided lumbar facet radiofrequency ablation + Left S-I Block  Diagnostic cervical epidural steroid injection  Diagnostic bilateral cervical facet block  Possible bilateral cervical facet radiofrequency ablation  Diagnostic bilateral lumbar facet block  Possible bilateral lumbar facet radiofrequency ablation  Diagnostic left L4-5 lumbar epidural steroid injection  Diagnostic left S1 selective nerve root block  Diagnostic bilateral sacroiliac joint block  Possible bilateral sacroiliac joint radiofrequency ablation  Diagnostic right trochanteric bursa injection    Palliative PRN treatment(s):   Palliativecervical epidural steroid injection  Palliativebilateral cervical facet block  Palliativebilateral lumbar facet block  Palliativeleft L4-5 lumbar epidural steroid injection  Palliativeleft caudal epidural steroid injection  Palliativeleft S1  selective nerve root block  Palliativebilateral sacroiliac joint block  Palliativeright trochanteric bursa injection    Provider-requested follow-up: Return for Procedure (with sedation): (B) S-I + (R) L-FCT BLK.  Future Appointments Date Time Provider [place]  03/11/2017 8:45 AM Vevelyn Francois, NP [facility] None  03/13/2017 9:15 AM Milinda Pointer, MD [facility] None   Primary Care Physician: Lavera Guise, MD Location: [facility] Note by: Gaspar Cola, MD Date: 02/20/2017; Time: 3:28 PM

## 2017-02-24 DIAGNOSIS — Z961 Presence of intraocular lens: Secondary | ICD-10-CM | POA: Insufficient documentation

## 2017-03-11 ENCOUNTER — Ambulatory Visit: Payer: Medicare Other | Attending: Nurse Practitioner | Admitting: Nurse Practitioner

## 2017-03-11 ENCOUNTER — Other Ambulatory Visit: Payer: Self-pay | Admitting: Internal Medicine

## 2017-03-11 ENCOUNTER — Encounter: Payer: Self-pay | Admitting: Nurse Practitioner

## 2017-03-11 VITALS — BP 119/50 | HR 81 | Temp 98.0°F | Resp 16 | Ht 60.5 in | Wt 148.0 lb

## 2017-03-11 DIAGNOSIS — G43909 Migraine, unspecified, not intractable, without status migrainosus: Secondary | ICD-10-CM | POA: Insufficient documentation

## 2017-03-11 DIAGNOSIS — M8938 Hypertrophy of bone, other site: Secondary | ICD-10-CM | POA: Insufficient documentation

## 2017-03-11 DIAGNOSIS — M533 Sacrococcygeal disorders, not elsewhere classified: Secondary | ICD-10-CM | POA: Insufficient documentation

## 2017-03-11 DIAGNOSIS — H25049 Posterior subcapsular polar age-related cataract, unspecified eye: Secondary | ICD-10-CM | POA: Diagnosis not present

## 2017-03-11 DIAGNOSIS — M1610 Unilateral primary osteoarthritis, unspecified hip: Secondary | ICD-10-CM | POA: Insufficient documentation

## 2017-03-11 DIAGNOSIS — F410 Panic disorder [episodic paroxysmal anxiety] without agoraphobia: Secondary | ICD-10-CM | POA: Diagnosis not present

## 2017-03-11 DIAGNOSIS — M791 Myalgia: Secondary | ICD-10-CM | POA: Insufficient documentation

## 2017-03-11 DIAGNOSIS — Z961 Presence of intraocular lens: Secondary | ICD-10-CM | POA: Diagnosis not present

## 2017-03-11 DIAGNOSIS — Z5181 Encounter for therapeutic drug level monitoring: Secondary | ICD-10-CM | POA: Insufficient documentation

## 2017-03-11 DIAGNOSIS — E785 Hyperlipidemia, unspecified: Secondary | ICD-10-CM | POA: Diagnosis not present

## 2017-03-11 DIAGNOSIS — M47896 Other spondylosis, lumbar region: Secondary | ICD-10-CM

## 2017-03-11 DIAGNOSIS — M5416 Radiculopathy, lumbar region: Secondary | ICD-10-CM

## 2017-03-11 DIAGNOSIS — G8929 Other chronic pain: Secondary | ICD-10-CM

## 2017-03-11 DIAGNOSIS — M7072 Other bursitis of hip, left hip: Secondary | ICD-10-CM | POA: Insufficient documentation

## 2017-03-11 DIAGNOSIS — M542 Cervicalgia: Secondary | ICD-10-CM | POA: Insufficient documentation

## 2017-03-11 DIAGNOSIS — Z87891 Personal history of nicotine dependence: Secondary | ICD-10-CM | POA: Insufficient documentation

## 2017-03-11 DIAGNOSIS — F411 Generalized anxiety disorder: Secondary | ICD-10-CM | POA: Diagnosis not present

## 2017-03-11 DIAGNOSIS — M47892 Other spondylosis, cervical region: Secondary | ICD-10-CM | POA: Diagnosis not present

## 2017-03-11 DIAGNOSIS — M7071 Other bursitis of hip, right hip: Secondary | ICD-10-CM | POA: Diagnosis not present

## 2017-03-11 DIAGNOSIS — M7918 Myalgia, other site: Secondary | ICD-10-CM

## 2017-03-11 DIAGNOSIS — Z79899 Other long term (current) drug therapy: Secondary | ICD-10-CM | POA: Insufficient documentation

## 2017-03-11 DIAGNOSIS — G629 Polyneuropathy, unspecified: Secondary | ICD-10-CM | POA: Insufficient documentation

## 2017-03-11 DIAGNOSIS — M109 Gout, unspecified: Secondary | ICD-10-CM | POA: Diagnosis not present

## 2017-03-11 DIAGNOSIS — Z981 Arthrodesis status: Secondary | ICD-10-CM | POA: Diagnosis not present

## 2017-03-11 DIAGNOSIS — M792 Neuralgia and neuritis, unspecified: Secondary | ICD-10-CM

## 2017-03-11 DIAGNOSIS — H2512 Age-related nuclear cataract, left eye: Secondary | ICD-10-CM | POA: Insufficient documentation

## 2017-03-11 DIAGNOSIS — M4696 Unspecified inflammatory spondylopathy, lumbar region: Secondary | ICD-10-CM | POA: Diagnosis not present

## 2017-03-11 DIAGNOSIS — Z79891 Long term (current) use of opiate analgesic: Secondary | ICD-10-CM | POA: Diagnosis not present

## 2017-03-11 DIAGNOSIS — M47816 Spondylosis without myelopathy or radiculopathy, lumbar region: Secondary | ICD-10-CM

## 2017-03-11 DIAGNOSIS — G894 Chronic pain syndrome: Secondary | ICD-10-CM | POA: Diagnosis not present

## 2017-03-11 DIAGNOSIS — Z1231 Encounter for screening mammogram for malignant neoplasm of breast: Secondary | ICD-10-CM

## 2017-03-11 DIAGNOSIS — K219 Gastro-esophageal reflux disease without esophagitis: Secondary | ICD-10-CM | POA: Insufficient documentation

## 2017-03-11 DIAGNOSIS — I25119 Atherosclerotic heart disease of native coronary artery with unspecified angina pectoris: Secondary | ICD-10-CM | POA: Insufficient documentation

## 2017-03-11 DIAGNOSIS — M7061 Trochanteric bursitis, right hip: Secondary | ICD-10-CM | POA: Insufficient documentation

## 2017-03-11 MED ORDER — OXYCODONE-ACETAMINOPHEN 7.5-325 MG PO TABS: 1.0000 | ORAL_TABLET | Freq: Every day | ORAL | 0 refills | Status: DC | PRN

## 2017-03-11 MED ORDER — GABAPENTIN 300 MG PO CAPS: 300.0000 mg | ORAL_CAPSULE | Freq: Two times a day (BID) | ORAL | 0 refills | Status: DC

## 2017-03-11 MED ORDER — CYCLOBENZAPRINE HCL 10 MG PO TABS: 10.0000 mg | ORAL_TABLET | Freq: Three times a day (TID) | ORAL | 0 refills | Status: DC | PRN

## 2017-03-11 NOTE — Progress Notes (Signed)
Nursing Pain Medication Assessment:  Safety precautions to be maintained throughout the outpatient stay will include: orient to surroundings, keep bed in low position, maintain call bell within reach at all times, provide assistance with transfer out of bed and ambulation.  Medication Inspection Compliance: Pill count conducted under aseptic conditions, in front of the patient. Neither the pills nor the bottle was removed from the patient's sight at any time. Once count was completed pills were immediately returned to the patient in their original bottle.  Medication: See above Pill/Patch Count: 63 of 150 pills remain Pill/Patch Appearance: Markings consistent with prescribed medication Bottle Appearance: Standard pharmacy container. Clearly labeled. Filled Date: 08 / 27 / 2018 Last Medication intake:  Today

## 2017-03-11 NOTE — Progress Notes (Signed)
Patient's Name: Jacqueline Thornton  MRN: [id number]  Referring Provider: Lavera Guise, MD  DOB: 25-Jan-1951  PCP: Lavera Guise, MD  DOS: 03/11/2017  Note by: Vevelyn Francois NP  Service setting: Ambulatory outpatient  Specialty: Interventional Pain Management  Location: [facility] (AMB) Pain Management Facility    Patient type: Established    Primary Reason(s) for Visit: Encounter for prescription drug management. (Level of risk: moderate)  CC: Back Pain (lower)  HPI  Jacqueline Thornton is a 66 y.o. year old, female patient, who comes today for a medication management evaluation. She has Chronic pain syndrome;  Chronic low back pain (Location of Secondary source of pain) (Bilateral) (midline to tailbone) (R>L); Spondylosis of lumbar spine;  Lumbar annular disc tear (L4-5); Discogenic low back pain (L3-4 and L4-5); Lumbar facet hypertrophy; Lumbar facet syndrome (Bilateral) (R>L); Chronic neck pain; Cervical spondylosis; Hx of cervical spine surgery; Cervical spinal fusion (C6-7 interbody fusion);Chronic sacroiliac joint pain (Bilateral) (L>R); Chronic lumbar radicular pain (Location of Primary Source of Pain) (Left) (S1 Dermatome);Trochanteric bursitis of right hip; Chronic hip pain (Bilateral) (L>R); Neurogenic pain; Myofascial pain; Osteoarthritis of hip (Left); Degenerative disk disease; Myalgia; Trochanteric bursitis of hip (Bilateral) (L>R); on her problem list. Her primarily concern today is the Back Pain (lower)  Pain Assessment: Location: Lower Back Radiating: hips/buttocks. down side of leg to knee Onset: More than a month ago Duration: Chronic pain Quality: Stabbing, Aching, Discomfort (deep) Severity: 3 /10 (self-reported pain score)  Note: Reported level is compatible with observation.                   Effect on ADL: vacuuming. stant,sit or walk long periods time Timing: Constant Modifying factors: medications, heat, ice  Jacqueline Thornton was last scheduled for an appointment on 12/05/16 for  medication management. During today's appointment we reviewed Jacqueline Thornton's chronic pain status, as well as her outpatient medication regimen. She admits that the pain is bilateral with right being the worse today. She has radicular symptoms on the left to her knee. She denies any current weakness. She admits that she needs both interventional therapy and her medication to be able to function. She does have occasional constipation but it is controlled at this time.    The patient  reports that she does not use drugs. Her body mass index is 28.43 kg/m.  Further details on both, my assessment(s), as well as the proposed treatment plan, please see below.  Controlled Substance Pharmacotherapy Assessment REMS (Risk Evaluation and Mitigation Strategy)  Analgesic:Oxycodone 7.5/325 one tablet 5 times a day. (56.25 mg/day) MME/day:56.25 mg/day  Ignatius Specking, RN  03/11/2017  9:04 AM  Sign at close encounter Nursing Pain Medication Assessment:  Safety precautions to be maintained throughout the outpatient stay will include: orient to surroundings, keep bed in low position, maintain call bell within reach at all times, provide assistance with transfer out of bed and ambulation.  Medication Inspection Compliance: Pill count conducted under aseptic conditions, in front of the patient. Neither the pills nor the bottle was removed from the patient's sight at any time. Once count was completed pills were immediately returned to the patient in their original bottle.  Medication: See above Pill/Patch Count: 63 of 150 pills remain Pill/Patch Appearance: Markings consistent with prescribed medication Bottle Appearance: Standard pharmacy container. Clearly labeled. Filled Date: 08 / 27 / 2018 Last Medication intake:  Today   Pharmacokinetics: Liberation and absorption (onset of action): WNL Distribution (time to peak effect): WNL Metabolism  and excretion (duration of action): WNL          Pharmacodynamics: Desired effects: Analgesia: Jacqueline Thornton reports >50% benefit. Functional ability: Patient reports that medication allows her to accomplish basic ADLs Clinically meaningful improvement in function (CMIF): Sustained CMIF goals met Perceived effectiveness: Described as relatively effective, allowing for increase in activities of daily living (ADL) Undesirable effects: Side-effects or Adverse reactions: None reported Monitoring: [place] PMP: Online review of the past 77-monthperiod conducted. Compliant with practice rules and regulations List of all UDS test(s) done:  Lab Results  Component Value Date   TOXASSSELUR FINAL 09/27/2015   TOXASSSELUR FINAL 08/23/2015   TOXASSSELUR FINAL 07/06/2015   Last UDS on record: ToxAssure Select 13  Date Value Ref Range Status  09/27/2015 FINAL  Final    Comment:    ==================================================================== TOXASSURE SELECT 13 (MW) ==================================================================== Test                             Result       Flag       Units Drug Present and Declared for Prescription Verification   Oxycodone                      2337         EXPECTED   ng/mg creat   Oxymorphone                    368          EXPECTED   ng/mg creat   Noroxycodone                   >3802        EXPECTED   ng/mg creat   Noroxymorphone                 372          EXPECTED   ng/mg creat    Sources of oxycodone are scheduled prescription medications.    Oxymorphone, noroxycodone, and noroxymorphone are expected    metabolites of oxycodone. Oxymorphone is also available as a    scheduled prescription medication. ==================================================================== Test                      Result    Flag   Units      Ref Range   Creatinine              263              mg/dL      >=20 ==================================================================== Declared Medications:  The flagging  and interpretation on this report are based on the  following declared medications.  Unexpected results may arise from  inaccuracies in the declared medications.  **Note: The testing scope of this panel includes these medications:  Oxycodone (Percocet)  **Note: The testing scope of this panel does not include following  reported medications:  Acetaminophen (Percocet)  Duloxetine (Cymbalta)  Enalapril (Vasotec)  Estradiol (Estrace)  Fenofibrate  Gabapentin  Meloxicam (Mobic)  Methocarbamol (Robaxin)  Naloxone  Omeprazole (Nexium)  Vitamin D ==================================================================== For clinical consultation, please call ([phone] ====================================================================    UDS interpretation: Compliant          Medication Assessment Form: Reviewed. Patient indicates being compliant with therapy Treatment compliance: Compliant Risk Assessment Profile: Aberrant behavior: See prior evaluations. None observed or detected today Comorbid factors increasing risk of overdose:  See prior notes. No additional risks detected today Risk of substance use disorder (SUD): Low     Opioid Risk Tool - 03/11/17 0900      Family History of Substance Abuse   Alcohol Negative   Illegal Drugs Negative   Rx Drugs Negative     Personal History of Substance Abuse   Alcohol Negative   Illegal Drugs Negative   Rx Drugs Negative     Psychological Disease   Psychological Disease Negative   Depression Negative     Total Score   Opioid Risk Tool Scoring 0   Opioid Risk Interpretation Low Risk     ORT Scoring interpretation table:  Score <3 = Low Risk for SUD  Score between 4-7 = Moderate Risk for SUD  Score >8 = High Risk for Opioid Abuse   Risk Mitigation Strategies:  Patient Counseling: Covered Patient-Prescriber Agreement (PPA): Present and active  Notification to other healthcare providers: Done  Pharmacologic Plan: No  change in therapy, at this time  Laboratory Chemistry  Inflammation Markers (CRP: Acute Phase) (ESR: Chronic Phase) Lab Results  Component Value Date   CRP 4.8 12/05/2016   ESRSEDRATE 2 12/05/2016                 Renal Function Markers Lab Results  Component Value Date   BUN 8 12/05/2016   CREATININE 0.88 12/05/2016   GFRAA 80 12/05/2016   GFRNONAA 70 12/05/2016                 Hepatic Function Markers Lab Results  Component Value Date   AST 19 12/05/2016   ALT 13 12/05/2016   ALBUMIN 3.7 12/05/2016   ALKPHOS 48 12/05/2016                 Electrolytes Lab Results  Component Value Date   NA 140 12/05/2016   K 4.0 12/05/2016   CL 103 12/05/2016   CALCIUM 9.3 12/05/2016   MG 2.2 12/05/2016                 Neuropathy Markers Lab Results  Component Value Date   VITAMINB12 202 (L) 12/05/2016                 Bone Pathology Markers Lab Results  Component Value Date   ALKPHOS 48 12/05/2016   25OHVITD1 50 12/05/2016   25OHVITD2 <1.0 12/05/2016   25OHVITD3 49 12/05/2016   CALCIUM 9.3 12/05/2016                 Coagulation Parameters Lab Results  Component Value Date   INR 1.0 03/02/2014   LABPROT 12.7 03/02/2014   PLT 197 01/26/2012                 Cardiovascular Markers Lab Results  Component Value Date   HGB 14.2 01/26/2012   HCT 41.1 01/26/2012                 Note: Lab results reviewed.  Recent Diagnostic Imaging Review  Dg C-arm 1-60 Min-no Report  Result Date: 12/11/2016 Fluoroscopy was utilized by the requesting physician.  No radiographic interpretation.   Note: Imaging results reviewed.          Meds   Current Outpatient Prescriptions:  .  acetaminophen (TYLENOL) 325 MG tablet, Take by mouth., Disp: , Rfl:  .  alendronate (FOSAMAX) 35 MG tablet, Take 35 mg by mouth every 7 (seven) days. Take with a full glass of water on an  empty stomach., Disp: , Rfl:  .  Bromfenac Sodium 0.075 % SOLN, Apply to eye., Disp: , Rfl:  .  cholecalciferol  (VITAMIN D) 400 UNITS TABS tablet, Take 1,000 Units by mouth daily., Disp: , Rfl:  .  [START ON 03/26/2017] cyclobenzaprine (FLEXERIL) 10 MG tablet, Take 1 tablet (10 mg total) by mouth 3 (three) times daily as needed for muscle spasms., Disp: 270 tablet, Rfl: 0 .  Difluprednate 0.05 % EMUL, Apply to eye., Disp: , Rfl:  .  DULoxetine (CYMBALTA) 60 MG capsule, Take 60 mg by mouth daily. , Disp: , Rfl:  .  enalapril (VASOTEC) 5 MG tablet, Take 5 mg by mouth daily., Disp: , Rfl:  .  esomeprazole (NEXIUM) 20 MG capsule, Take 20 mg by mouth daily at 12 noon., Disp: , Rfl:  .  estradiol (ESTRACE) 0.5 MG tablet, Take 0.5 mg by mouth daily. , Disp: , Rfl:  .  Fe Fum-FA-B Cmp-C-Zn-Mg-Mn-Cu (FERROCITE PLUS PO), Take 1 tablet by mouth daily., Disp: , Rfl:  .  fenofibrate 54 MG tablet, Take by mouth., Disp: , Rfl:  .  [START ON 03/26/2017] gabapentin (NEURONTIN) 300 MG capsule, Take 1 capsule (300 mg total) by mouth 2 (two) times daily., Disp: 180 capsule, Rfl: 0 .  Magnesium Oxide, Antacid, 500 MG CAPS, Take by mouth., Disp: , Rfl:  .  ofloxacin (OCUFLOX) 0.3 % ophthalmic solution, Apply to eye., Disp: , Rfl:  .  [START ON 03/26/2017] oxyCODONE-acetaminophen (PERCOCET) 7.5-325 MG tablet, Take 1 tablet by mouth 5 (five) times daily as needed for severe pain., Disp: 150 tablet, Rfl: 0 .  rosuvastatin (CRESTOR) 5 MG tablet, Take 5 mg by mouth every other day. , Disp: , Rfl:  .  [START ON 04/25/2017] oxyCODONE-acetaminophen (PERCOCET) 7.5-325 MG tablet, Take 1 tablet by mouth 5 (five) times daily as needed for severe pain., Disp: 150 tablet, Rfl: 0 .  [START ON 05/25/2017] oxyCODONE-acetaminophen (PERCOCET) 7.5-325 MG tablet, Take 1 tablet by mouth 5 (five) times daily as needed for severe pain., Disp: 150 tablet, Rfl: 0  ROS  Constitutional: Denies any fever or chills Gastrointestinal: No reported hemesis, hematochezia, vomiting, or acute GI distress Musculoskeletal: Denies any acute onset joint swelling,  redness, loss of ROM, or weakness Neurological: No reported episodes of acute onset apraxia, aphasia, dysarthria, agnosia, amnesia, paralysis, loss of coordination, or loss of consciousness  Allergies  Ms. Dessert has No Known Allergies.  [place]  Drug: Ms. Kaufman  reports that she does not use drugs. Alcohol:  reports that she does not drink alcohol. Tobacco:  reports that she has quit smoking. She has never used smokeless tobacco. Medical:  has a past medical history of Absolute anemia (04/11/2015); Angina pectoris ([place]); Anxiety; Arthritis; Arthropathy of sacroiliac joint ([place]) (04/11/2015); Asthma; Atypical face pain (04/24/2015); CAD (coronary artery disease); Chronic back pain; Depression; Fibromyalgia; GERD (gastroesophageal reflux disease); 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) (04/11/2015); Hyperlipidemia; Hypertension; Low back pain (04/06/2015); Lumbar radicular pain (04/18/2015); Migraine; Narrowing of intervertebral disc space (04/11/2015); Sacroiliac joint pain (04/11/2015); and Spine disorder. Surgical: Ms. Rasnick  has a past surgical history that includes Cholecystectomy; Abdominal hysterectomy; Neck surgery (Bilateral); Appendectomy; Neck surgery; Shoulder surgery; Colonoscopy with propofol (N/A, 07/19/2015); and caract surger. Family: family history includes Diabetes in her father and mother; Hypertension in her sister; Stroke in her mother.  Constitutional Exam  General appearance: Well nourished, well developed, and well hydrated. In no apparent acute distress Vitals:   03/11/17 0851  BP: (!) 119/50  Pulse: 81  Resp: 16  Temp: 98 F (36.7 C)  SpO2: 100%  Weight: 148 lb (67.1 kg)  Height: 5' 0.5" (1.537 m)  Psych/Mental status: Alert, oriented x 3 (person, place, & time)       Eyes: PERLA Respiratory: No evidence of acute respiratory distress  Cervical Spine Area Exam  Skin & Axial Inspection: Well healed scar from previous spine surgery detected Alignment:  Symmetrical Functional ROM: Unrestricted ROM      Stability: No instability detected Muscle Tone/Strength: Functionally intact. No obvious neuro-muscular anomalies detected. Sensory (Neurological): Unimpaired Palpation: No palpable anomalies              Upper Extremity (UE) Exam    Side: Right upper extremity  Side: Left upper extremity  Skin & Extremity Inspection: Skin color, temperature, and hair growth are WNL. No peripheral edema or cyanosis. No masses, redness, swelling, asymmetry, or associated skin lesions. No contractures.  Skin & Extremity Inspection: Skin color, temperature, and hair growth are WNL. No peripheral edema or cyanosis. No masses, redness, swelling, asymmetry, or associated skin lesions. No contractures.  Functional ROM: Unrestricted ROM          Functional ROM: Unrestricted ROM          Muscle Tone/Strength: Functionally intact. No obvious neuro-muscular anomalies detected.  Muscle Tone/Strength: Functionally intact. No obvious neuro-muscular anomalies detected.  Sensory (Neurological): Unimpaired          Sensory (Neurological): Unimpaired          Palpation: No palpable anomalies              Palpation: No palpable anomalies              Specialized Test(s): Deferred         Specialized Test(s): Deferred          Thoracic Spine Area Exam  Skin & Axial Inspection: No masses, redness, or swelling Alignment: Symmetrical Functional ROM: Unrestricted ROM Stability: No instability detected Muscle Tone/Strength: Functionally intact. No obvious neuro-muscular anomalies detected. Sensory (Neurological): Unimpaired Muscle strength & Tone: No palpable anomalies  Lumbar Spine Area Exam  Skin & Axial Inspection: Well healed scar from previous spine surgery detected Alignment: Symmetrical Functional ROM: Unrestricted ROM      Stability: No instability detected Muscle Tone/Strength: Functionally intact. No obvious neuro-muscular anomalies detected. Sensory (Neurological):  Unimpaired Palpation: Complains of area being tender to palpation       Provocative Tests: Lumbar Hyperextension and rotation test: Positive bilaterally for facet joint pain. Lumbar Lateral bending test: evaluation deferred today       Patrick's Maneuver: evaluation deferred today                    Gait & Posture Assessment  Ambulation: Unassisted Gait: Relatively normal for age and body habitus Posture: WNL   Lower Extremity Exam    Side: Right lower extremity  Side: Left lower extremity  Skin & Extremity Inspection: Skin color, temperature, and hair growth are WNL. No peripheral edema or cyanosis. No masses, redness, swelling, asymmetry, or associated skin lesions. No contractures.  Skin & Extremity Inspection: Skin color, temperature, and hair growth are WNL. No peripheral edema or cyanosis. No masses, redness, swelling, asymmetry, or associated skin lesions. No contractures.  Functional ROM: Unrestricted ROM          Functional ROM: Unrestricted ROM          Muscle Tone/Strength: Functionally intact. No obvious neuro-muscular anomalies detected.  Muscle Tone/Strength: Functionally intact.  No obvious neuro-muscular anomalies detected.  Sensory (Neurological): Unimpaired  Sensory (Neurological): Unimpaired  Palpation: No palpable anomalies  Palpation: No palpable anomalies   Assessment  Primary Diagnosis & Pertinent Problem List: The primary encounter diagnosis was Lumbar facet syndrome (Bilateral) (R>L). Diagnoses of Chronic lumbar radicular pain (Location of Primary Source of Pain) (Left) (S1 Dermatome), Lumbar facet hypertrophy, Myofascial pain, Neurogenic pain, Chronic pain syndrome, and Long term current use of opiate analgesic were also pertinent to this visit.  Status Diagnosis  Controlled Controlled Controlled 1. Lumbar facet syndrome (Bilateral) (R>L)   2. Chronic lumbar radicular pain (Location of Primary Source of Pain) (Left) (S1 Dermatome)   3. Lumbar facet hypertrophy    4. Myofascial pain   5. Neurogenic pain   6. Chronic pain syndrome   7. Long term current use of opiate analgesic     Problems updated and reviewed during this visit: Problem  Trochanteric bursitis of hip (Bilateral) (L>R)  Osteoarthritis of hip (Left)  Neurogenic Pain  Myofascial Pain  Trochanteric Bursitis of Right Hip  Chronic hip pain (Bilateral) (L>R)  Chronic sacroiliac joint pain (Bilateral) (L>R)  Chronic lumbar radicular pain (Location of Primary Source of Pain) (Left) (S1 Dermatome)  Chronic low back pain (Location of Secondary source of pain) (Bilateral) (midline to tailbone) (R>L)  Spondylosis of Lumbar Spine   Lumbar annular disc tear (L4-5)  Discogenic low back pain (L3-4 and L4-5)  Lumbar facet hypertrophy  Lumbar facet syndrome (Bilateral) (R>L)  Chronic Neck Pain  Cervical spondylosis  Cervical spinal fusion (C6-7 interbody fusion)  Chronic Pain Syndrome  Long Term Prescription Opiate Use  Encounter for Chronic Pain Management  Long Term Current Use of Opiate Analgesic  Encounter for Therapeutic Drug Level Monitoring  Opiate use (56.25 MME/Day)  Pseudophakia of Left Eye  Age-Related Nuclear Cataract of Left Eye  Posterior Subcapsular Polar Age-Related Cataract of Left Eye  Pseudophakia of Right Eye   Overview:  S/p Phaco/LRI/IOL OD with Alcon AU00T0 20.5D, two 30 LRI_0 /273, wound 180 closed with Resure, on 12/23/2016, Verion, ORA, DUEC Moxi   Degenerative Disk Disease  Uncomplicated Opioid Dependence ([facility])  Myalgia  Gout  Hyperlipidemia  Headache, Migraine  Panic Attack  Hx of Cervical Spine Surgery  Coronary Atherosclerosis  Cad in Native Artery  Arthrodesis Status   Overview:  Overview:  C6-7 interbody fusion.   Anxiety, Generalized   Plan of Care  Pharmacotherapy (Medications Ordered): Meds ordered this encounter  Medications  . oxyCODONE-acetaminophen (PERCOCET) 7.5-325 MG tablet    Sig: Take 1 tablet by mouth 5 (five) times daily as  needed for severe pain.    Dispense:  150 tablet    Refill:  0    Do not place this medication, or any other prescription from our practice, on "Automatic Refill". Patient may have prescription filled one day early if pharmacy is closed on scheduled refill date. Do not fill until: 03/26/2017 To last until: 04/25/2017    Order Specific Question:   Supervising Provider    Answer:   Milinda Pointer [phone]  . oxyCODONE-acetaminophen (PERCOCET) 7.5-325 MG tablet    Sig: Take 1 tablet by mouth 5 (five) times daily as needed for severe pain.    Dispense:  150 tablet    Refill:  0    Do not place this medication, or any other prescription from our practice, on "Automatic Refill". Patient may have prescription filled one day early if pharmacy is closed on scheduled refill date. Do not fill until:04/25/2017 To last  until:05/25/2017    Order Specific Question:   Supervising Provider    Answer:   Milinda Pointer [phone]  . oxyCODONE-acetaminophen (PERCOCET) 7.5-325 MG tablet    Sig: Take 1 tablet by mouth 5 (five) times daily as needed for severe pain.    Dispense:  150 tablet    Refill:  0    Do not place this medication, or any other prescription from our practice, on "Automatic Refill". Patient may have prescription filled one day early if pharmacy is closed on scheduled refill date. Do not fill until:05/25/2017 To last until:06/24/2017    Order Specific Question:   Supervising Provider    Answer:   Milinda Pointer [phone]  . cyclobenzaprine (FLEXERIL) 10 MG tablet    Sig: Take 1 tablet (10 mg total) by mouth 3 (three) times daily as needed for muscle spasms.    Dispense:  270 tablet    Refill:  0    Do not place this medication, or any other prescription from our practice, on "Automatic Refill". Patient may have prescription filled one day early if pharmacy is closed on scheduled refill date.    Order Specific Question:   Supervising Provider    Answer:   Milinda Pointer  [phone]  . gabapentin (NEURONTIN) 300 MG capsule    Sig: Take 1 capsule (300 mg total) by mouth 2 (two) times daily.    Dispense:  180 capsule    Refill:  0    Do not add this medication to the electronic "Automatic Refill" notification system. Patient may have prescription filled one day early if pharmacy is closed on scheduled refill date.    Order Specific Question:   Supervising Provider    Answer:   Milinda Pointer [[id number]]   New Prescriptions   No medications on file   Medications administered today: Ms. Drolet had no medications administered during this visit. Lab-work, procedure(s), and/or referral(s): Orders Placed This Encounter  Procedures  . ToxASSURE Select 13 (MW), Urine   Imaging and/or referral(s): None  Interventional therapies: Planned, scheduled, and/or pending:   As scheduled bilateral sacroiliac joint block and Right sided lumbar facet     Considering:   Diagnostic caudal epidural steroid injection + diagnostic epidurogram Palliative Right sided lumbar facet radiofrequency ablation + Left S-I Block  Diagnostic cervical epidural steroid injection  Diagnostic bilateral cervical facet block  Possible bilateral cervical facet radiofrequency ablation  Diagnostic bilateral lumbar facet block  Possible bilateral lumbar facet radiofrequency ablation  Diagnostic left L4-5 lumbar epidural steroid injection  Diagnostic left S1 selective nerve root block  Diagnostic bilateral sacroiliac joint block  Possible bilateral sacroiliac joint radiofrequency ablation  Diagnostic right trochanteric bursa injection    Palliative PRN treatment(s):   Palliativecervical epidural steroid injection  Palliativebilateral cervical facet block  Palliativebilateral lumbar facet block  Palliativeleft L4-5 lumbar epidural steroid injection  Palliativeleft caudal epidural steroid injection  Palliativeleft S1 selective nerve root block  Palliativebilateral sacroiliac  joint block  Palliativeright trochanteric bursa injec   Provider-requested follow-up: Return in about 3 months (around 06/10/2017) for MedMgmt.  Future Appointments Date Time Provider [place]  03/13/2017 9:15 AM Milinda Pointer, MD [facility] None  04/02/2017 2:00 PM Milinda Pointer, MD [facility] None  06/10/2017 9:00 AM Vevelyn Francois, NP [facility] None   Primary Care Physician: Lavera Guise, MD Location: [facility] Note by: Vevelyn Francois NP Date: 03/11/2017; Time: 9:49 AM  Pain Score Disclaimer: We use the NRS-11 scale. This is a self-reported,  subjective measurement of pain severity with only modest accuracy. It is used primarily to identify changes within a particular patient. It must be understood that outpatient pain scales are significantly less accurate that those used for research, where they can be applied under ideal controlled circumstances with minimal exposure to variables. In reality, the score is likely to be a combination of pain intensity and pain affect, where pain affect describes the degree of emotional arousal or changes in action readiness caused by the sensory experience of pain. Factors such as social and work situation, setting, emotional state, anxiety levels, expectation, and prior pain experience may influence pain perception and show large inter-individual differences that may also be affected by time variables.  Patient instructions provided during this appointment: Patient Instructions    ____________________________________________________________________________________________  Medication Rules  Applies to: All patients receiving prescriptions (written or electronic).  Pharmacy of record: Pharmacy where electronic prescriptions will be sent. If written prescriptions are taken to a different pharmacy, please inform the nursing staff. The pharmacy listed in the electronic medical record should be the one  where you would like electronic prescriptions to be sent.  Prescription refills: Only during scheduled appointments. Applies to both, written and electronic prescriptions.  NOTE: The following applies primarily to controlled substances (Opioid* Pain Medications).   Patient's responsibilities: 1. Pain Pills: Bring all pain pills to every appointment (except for procedure appointments). 2. Pill Bottles: Bring pills in original pharmacy bottle. Always bring newest bottle. Bring bottle, even if empty. 3. Medication refills: You are responsible for knowing and keeping track of what medications you need refilled. The day before your appointment, write a list of all prescriptions that need to be refilled. Bring that list to your appointment and give it to the admitting nurse. Prescriptions will be written only during appointments. If you forget a medication, it will not be "Called in", "Faxed", or "electronically sent". You will need to get another appointment to get these prescribed. 4. Prescription Accuracy: You are responsible for carefully inspecting your prescriptions before leaving our office. Have the discharge nurse carefully go over each prescription with you, before taking them home. Make sure that your name is accurately spelled, that your address is correct. Check the name and dose of your medication to make sure it is accurate. Check the number of pills, and the written instructions to make sure they are clear and accurate. Make sure that you are given enough medication to last until your next medication refill appointment. 5. Taking Medication: Take medication as prescribed. Never take more pills than instructed. Never take medication more frequently than prescribed. Taking less pills or less frequently is permitted and encouraged, when it comes to controlled substances (written prescriptions).  6. Inform other Doctors: Always inform, all of your healthcare providers, of all the medications you  take. 7. Pain Medication from other Providers: You are not allowed to accept any additional pain medication from any other Doctor or Healthcare provider. There are two exceptions to this rule. (see below) In the event that you require additional pain medication, you are responsible for notifying us, as stated below. 8. Medication Agreement: You are responsible for carefully reading and following our Medication Agreement. This must be signed before receiving any prescriptions from our practice. Safely store a copy of your signed Agreement. Violations to the Agreement will result in no further prescriptions. (Additional copies of our Medication Agreement are available upon request.) 9. Laws, Rules, & Regulations: All patients are expected to follow all Federal and  State Laws, Statutes, Rules, & Regulations. Ignorance of the Laws does not constitute a valid excuse. The use of any illegal substances is prohibited. 10. Adopted CDC guidelines & recommendations: Target dosing levels will be at or below 60 MME/day. Use of benzodiazepines** is not recommended.  Exceptions: There are only two exceptions to the rule of not receiving pain medications from other Healthcare Providers. 1. Exception #1 (Emergencies): In the event of an emergency (i.e.: accident requiring emergency care), you are allowed to receive additional pain medication. However, you are responsible for: As soon as you are able, call our office [phone], at any time of the day or night, and leave a message stating your name, the date and nature of the emergency, and the name and dose of the medication prescribed. In the event that your call is answered by a member of our staff, make sure to document and save the date, time, and the name of the person that took your information.  2. Exception #2 (Planned Surgery): In the event that you are scheduled by another doctor or dentist to have any type of surgery or procedure, you are allowed (for a period  no longer than 30 days), to receive additional pain medication, for the acute post-op pain. However, in this case, you are responsible for picking up a copy of our "Post-op Pain Management for Surgeons" handout, and giving it to your surgeon or dentist. This document is available at our office, and does not require an appointment to obtain it. Simply go to our office during business hours (Monday-Thursday from 8:00 AM to 4:00 PM) (Friday 8:00 AM to 12:00 Noon) or if you have a scheduled appointment with [place], prior to your surgery, and ask for it by name. In addition, you will need to provide [place] with your name, name of your surgeon, type of surgery, and date of procedure or surgery.  *Opioid medications include: morphine, codeine, oxycodone, oxymorphone, hydrocodone, hydromorphone, meperidine, tramadol, tapentadol, buprenorphine, fentanyl, methadone. **Benzodiazepine medications include: diazepam (Valium), alprazolam (Xanax), clonazepam (Klonopine), lorazepam (Ativan), clorazepate (Tranxene), chlordiazepoxide (Librium), estazolam (Prosom), oxazepam (Serax), temazepam (Restoril), triazolam (Halcion)  ____________________________________________________________________________________________  BMI Assessment: Estimated body mass index is 28.43 kg/m as calculated from the following:   Height as of this encounter: 5' 0.5" (1.537 m).   Weight as of this encounter: 148 lb (67.1 kg).  BMI interpretation table: BMI level Category Range association with higher incidence of chronic pain  <18 kg/m2 Underweight   18.5-24.9 kg/m2 Ideal body weight   25-29.9 kg/m2 Overweight Increased incidence by 20%  30-34.9 kg/m2 Obese (Class I) Increased incidence by 68%  35-39.9 kg/m2 Severe obesity (Class II) Increased incidence by 136%  >40 kg/m2 Extreme obesity (Class III) Increased incidence by 254%   BMI Readings from Last 4 Encounters:  03/11/17 28.43 kg/m  02/20/17 27.93 kg/m  12/11/16 27.93 kg/m  12/05/16  27.93 kg/m   Wt Readings from Last 4 Encounters:  03/11/17 148 lb (67.1 kg)  02/20/17 143 lb (64.9 kg)  12/11/16 143 lb (64.9 kg)  12/05/16 143 lb (64.9 kg)

## 2017-03-11 NOTE — Patient Instructions (Addendum)
____________________________________________________________________________________________  Medication Rules  Applies to: All patients receiving prescriptions (written or electronic).  Pharmacy of record: Pharmacy where electronic prescriptions will be sent. If written prescriptions are taken to a different pharmacy, please inform the nursing staff. The pharmacy listed in the electronic medical record should be the one where you would like electronic prescriptions to be sent.  Prescription refills: Only during scheduled appointments. Applies to both, written and electronic prescriptions.  NOTE: The following applies primarily to controlled substances (Opioid* Pain Medications).   Patient's responsibilities: 1. Pain Pills: Bring all pain pills to every appointment (except for procedure appointments). 2. Pill Bottles: Bring pills in original pharmacy bottle. Always bring newest bottle. Bring bottle, even if empty. 3. Medication refills: You are responsible for knowing and keeping track of what medications you need refilled. The day before your appointment, write a list of all prescriptions that need to be refilled. Bring that list to your appointment and give it to the admitting nurse. Prescriptions will be written only during appointments. If you forget a medication, it will not be "Called in", "Faxed", or "electronically sent". You will need to get another appointment to get these prescribed. 4. Prescription Accuracy: You are responsible for carefully inspecting your prescriptions before leaving our office. Have the discharge nurse carefully go over each prescription with you, before taking them home. Make sure that your name is accurately spelled, that your address is correct. Check the name and dose of your medication to make sure it is accurate. Check the number of pills, and the written instructions to make sure they are clear and accurate. Make sure that you are given enough medication to  last until your next medication refill appointment. 5. Taking Medication: Take medication as prescribed. Never take more pills than instructed. Never take medication more frequently than prescribed. Taking less pills or less frequently is permitted and encouraged, when it comes to controlled substances (written prescriptions).  6. Inform other Doctors: Always inform, all of your healthcare providers, of all the medications you take. 7. Pain Medication from other Providers: You are not allowed to accept any additional pain medication from any other Doctor or Healthcare provider. There are two exceptions to this rule. (see below) In the event that you require additional pain medication, you are responsible for notifying us, as stated below. 8. Medication Agreement: You are responsible for carefully reading and following our Medication Agreement. This must be signed before receiving any prescriptions from our practice. Safely store a copy of your signed Agreement. Violations to the Agreement will result in no further prescriptions. (Additional copies of our Medication Agreement are available upon request.) 9. Laws, Rules, & Regulations: All patients are expected to follow all Federal and State Laws, Statutes, Rules, & Regulations. Ignorance of the Laws does not constitute a valid excuse. The use of any illegal substances is prohibited. 10. Adopted CDC guidelines & recommendations: Target dosing levels will be at or below 60 MME/day. Use of benzodiazepines** is not recommended.  Exceptions: There are only two exceptions to the rule of not receiving pain medications from other Healthcare Providers. 1. Exception #1 (Emergencies): In the event of an emergency (i.e.: accident requiring emergency care), you are allowed to receive additional pain medication. However, you are responsible for: As soon as you are able, call our office [phone], at any time of the day or night, and leave a message stating your  name, the date and nature of the emergency, and the name and dose of the medication   prescribed. In the event that your call is answered by a member of our staff, make sure to document and save the date, time, and the name of the person that took your information.  2. Exception #2 (Planned Surgery): In the event that you are scheduled by another doctor or dentist to have any type of surgery or procedure, you are allowed (for a period no longer than 30 days), to receive additional pain medication, for the acute post-op pain. However, in this case, you are responsible for picking up a copy of our "Post-op Pain Management for Surgeons" handout, and giving it to your surgeon or dentist. This document is available at our office, and does not require an appointment to obtain it. Simply go to our office during business hours (Monday-Thursday from 8:00 AM to 4:00 PM) (Friday 8:00 AM to 12:00 Noon) or if you have a scheduled appointment with [place], prior to your surgery, and ask for it by name. In addition, you will need to provide [place] with your name, name of your surgeon, type of surgery, and date of procedure or surgery.  *Opioid medications include: morphine, codeine, oxycodone, oxymorphone, hydrocodone, hydromorphone, meperidine, tramadol, tapentadol, buprenorphine, fentanyl, methadone. **Benzodiazepine medications include: diazepam (Valium), alprazolam (Xanax), clonazepam (Klonopine), lorazepam (Ativan), clorazepate (Tranxene), chlordiazepoxide (Librium), estazolam (Prosom), oxazepam (Serax), temazepam (Restoril), triazolam (Halcion)  ____________________________________________________________________________________________  BMI Assessment: Estimated body mass index is 28.43 kg/m as calculated from the following:   Height as of this encounter: 5' 0.5" (1.537 m).   Weight as of this encounter: 148 lb (67.1 kg).  BMI interpretation table: BMI level Category Range association with higher incidence of chronic  pain  <18 kg/m2 Underweight   18.5-24.9 kg/m2 Ideal body weight   25-29.9 kg/m2 Overweight Increased incidence by 20%  30-34.9 kg/m2 Obese (Class I) Increased incidence by 68%  35-39.9 kg/m2 Severe obesity (Class II) Increased incidence by 136%  >40 kg/m2 Extreme obesity (Class III) Increased incidence by 254%   BMI Readings from Last 4 Encounters:  03/11/17 28.43 kg/m  02/20/17 27.93 kg/m  12/11/16 27.93 kg/m  12/05/16 27.93 kg/m   Wt Readings from Last 4 Encounters:  03/11/17 148 lb (67.1 kg)  02/20/17 143 lb (64.9 kg)  12/11/16 143 lb (64.9 kg)  12/05/16 143 lb (64.9 kg)

## 2017-03-13 ENCOUNTER — Ambulatory Visit
Admission: RE | Admit: 2017-03-13 | Discharge: 2017-03-13 | Disposition: A | Discharge status: Home / Self Care | Payer: Medicare Other | Source: Ambulatory Visit | Attending: Pain Medicine | Admitting: Pain Medicine

## 2017-03-13 ENCOUNTER — Ambulatory Visit (HOSPITAL_BASED_OUTPATIENT_CLINIC_OR_DEPARTMENT_OTHER): Payer: Medicare Other | Admitting: Pain Medicine

## 2017-03-13 ENCOUNTER — Encounter: Payer: Self-pay | Admitting: Pain Medicine

## 2017-03-13 VITALS — BP 121/59 | HR 74 | Temp 97.7°F | Resp 16 | Ht 60.0 in | Wt 148.0 lb

## 2017-03-13 DIAGNOSIS — Z90710 Acquired absence of both cervix and uterus: Secondary | ICD-10-CM | POA: Diagnosis not present

## 2017-03-13 DIAGNOSIS — M533 Sacrococcygeal disorders, not elsewhere classified: Secondary | ICD-10-CM | POA: Diagnosis not present

## 2017-03-13 DIAGNOSIS — Z9889 Other specified postprocedural states: Secondary | ICD-10-CM | POA: Diagnosis not present

## 2017-03-13 DIAGNOSIS — M545 Low back pain, unspecified: Secondary | ICD-10-CM

## 2017-03-13 DIAGNOSIS — M5386 Other specified dorsopathies, lumbar region: Secondary | ICD-10-CM | POA: Diagnosis not present

## 2017-03-13 DIAGNOSIS — Z9049 Acquired absence of other specified parts of digestive tract: Secondary | ICD-10-CM | POA: Insufficient documentation

## 2017-03-13 DIAGNOSIS — M25551 Pain in right hip: Secondary | ICD-10-CM | POA: Insufficient documentation

## 2017-03-13 DIAGNOSIS — Z79891 Long term (current) use of opiate analgesic: Secondary | ICD-10-CM | POA: Diagnosis not present

## 2017-03-13 DIAGNOSIS — G8929 Other chronic pain: Secondary | ICD-10-CM

## 2017-03-13 DIAGNOSIS — M47816 Spondylosis without myelopathy or radiculopathy, lumbar region: Secondary | ICD-10-CM | POA: Diagnosis not present

## 2017-03-13 DIAGNOSIS — M4696 Unspecified inflammatory spondylopathy, lumbar region: Secondary | ICD-10-CM | POA: Diagnosis present

## 2017-03-13 DIAGNOSIS — Z79899 Other long term (current) drug therapy: Secondary | ICD-10-CM | POA: Diagnosis not present

## 2017-03-13 DIAGNOSIS — M47896 Other spondylosis, lumbar region: Secondary | ICD-10-CM | POA: Insufficient documentation

## 2017-03-13 DIAGNOSIS — M25552 Pain in left hip: Secondary | ICD-10-CM | POA: Diagnosis not present

## 2017-03-13 MED ORDER — ROPIVACAINE HCL 2 MG/ML IJ SOLN
9.0000 mL | Freq: Once | INTRAMUSCULAR | Status: AC
  Administered 2017-03-13: 10 mL via PERINEURAL
  Filled 2017-03-13: qty 10

## 2017-03-13 MED ORDER — FENTANYL CITRATE (PF) 100 MCG/2ML IJ SOLN
25.0000 ug | INTRAMUSCULAR | Status: DC | PRN
  Administered 2017-03-13: 50 ug via INTRAVENOUS
  Filled 2017-03-13: qty 2

## 2017-03-13 MED ORDER — LACTATED RINGERS IV SOLN
1000.0000 mL | Freq: Once | INTRAVENOUS | Status: AC
  Administered 2017-03-13 (×2): 1000 mL via INTRAVENOUS

## 2017-03-13 MED ORDER — MIDAZOLAM HCL 5 MG/5ML IJ SOLN
1.0000 mg | INTRAMUSCULAR | Status: DC | PRN
  Administered 2017-03-13: 4 mg via INTRAVENOUS
  Filled 2017-03-13: qty 5

## 2017-03-13 MED ORDER — TRIAMCINOLONE ACETONIDE 40 MG/ML IJ SUSP
40.0000 mg | Freq: Once | INTRAMUSCULAR | Status: AC
  Administered 2017-03-13: 40 mg
  Filled 2017-03-13: qty 1

## 2017-03-13 MED ORDER — ROPIVACAINE HCL 2 MG/ML IJ SOLN
9.0000 mL | Freq: Once | INTRAMUSCULAR | Status: AC
  Administered 2017-03-13: 10 mL via INTRA_ARTICULAR
  Filled 2017-03-13: qty 10

## 2017-03-13 MED ORDER — LIDOCAINE HCL 2 % IJ SOLN
10.0000 mL | Freq: Once | INTRAMUSCULAR | Status: AC
  Administered 2017-03-13: 400 mg
  Filled 2017-03-13: qty 100

## 2017-03-13 MED ORDER — METHYLPREDNISOLONE ACETATE 80 MG/ML IJ SUSP
80.0000 mg | Freq: Once | INTRAMUSCULAR | Status: AC
Start: 2017-03-13 — End: 2017-03-13
  Administered 2017-03-13: 80 mg via INTRA_ARTICULAR
  Filled 2017-03-13: qty 1

## 2017-03-13 NOTE — Progress Notes (Signed)
Safety precautions to be maintained throughout the outpatient stay will include: orient to surroundings, keep bed in low position, maintain call bell within reach at all times, provide assistance with transfer out of bed and ambulation.  

## 2017-03-13 NOTE — Patient Instructions (Addendum)
____________________________________________________________________________________________  Post-Procedure instructions Instructions:  Apply ice: Fill a plastic sandwich bag with crushed ice. Cover it with a small towel and apply to injection site. Apply for 15 minutes then remove x 15 minutes. Repeat sequence on day of procedure, until you go to bed. The purpose is to minimize swelling and discomfort after procedure.  Apply heat: Apply heat to procedure site starting the day following the procedure. The purpose is to treat any soreness and discomfort from the procedure.  Food intake: Start with clear liquids (like water) and advance to regular food, as tolerated.   Physical activities: Keep activities to a minimum for the first 8 hours after the procedure.   Driving: If you have received any sedation, you are not allowed to drive for 24 hours after your procedure.  Blood thinner: Restart your blood thinner 6 hours after your procedure. (Only for those taking blood thinners)  Insulin: As soon as you can eat, you may resume your normal dosing schedule. (Only for those taking insulin)  Infection prevention: Keep procedure site clean and dry.  Post-procedure Pain Diary: Extremely important that this be done correctly and accurately. Recorded information will be used to determine the next step in treatment.  Pain evaluated is that of treated area only. Do not include pain from an untreated area.  Complete every hour, on the hour, for the initial 8 hours. Set an alarm to help you do this part accurately.  Do not go to sleep and have it completed later. It will not be accurate.  Follow-up appointment: Keep your follow-up appointment after the procedure. Usually 2 weeks for most procedures. (6 weeks in the case of radiofrequency.) Bring you pain diary.  Expect:  From numbing medicine (AKA: Local Anesthetics): Numbness or decrease in pain.  Onset: Full effect within 15 minutes of  injected.  Duration: It will depend on the type of local anesthetic used. On the average, 1 to 8 hours.   From steroids: Decrease in swelling or inflammation. Once inflammation is improved, relief of the pain will follow.  Onset of benefits: Depends on the amount of swelling present. The more swelling, the longer it will take for the benefits to be seen. In some cases, up to 10 days.  Duration: Steroids will stay in the system x 2 weeks. Duration of benefits will depend on multiple posibilities including persistent irritating factors.  From procedure: Some discomfort is to be expected once the numbing medicine wears off. This should be minimal if ice and heat are applied as instructed. Call if:  You experience numbness and weakness that gets worse with time, as opposed to wearing off.  New onset bowel or bladder incontinence. (Spinal procedures only)  Emergency Numbers:  [facility] business hours (Monday - Thursday, 8:00 AM - 4:00 PM) (Friday, 9:00 AM - 12:00 Noon): [phone]  After hours: [phone] ____________________________________________________________________________________________  Facet Blocks Patient Information  Description: The facets are joints in the spine between the vertebrae.  Like any joints in the body, facets can become irritated and painful.  Arthritis can also effect the facets.  By injecting steroids and local anesthetic in and around these joints, we can temporarily block the nerve supply to them.  Steroids act directly on irritated nerves and tissues to reduce selling and inflammation which often leads to decreased pain.  Facet blocks may be done anywhere along the spine from the neck to the low back depending upon the location of your pain.   After numbing the skin  with local anesthetic (like Novocaine), a small needle is passed onto the facet joints under x-ray guidance.  You may experience a sensation of pressure while this is being done.  The entire  block usually lasts about 15-25 minutes.   Conditions which may be treated by facet blocks:   Low back/buttock pain  Neck/shoulder pain  Certain types of headaches  Preparation for the injection:  1. Do not eat any solid food or dairy products within 8 hours of your appointment. 2. You may drink clear liquid up to 3 hours before appointment.  Clear liquids include water, black coffee, juice or soda.  No milk or cream please. 3. You may take your regular medication, including pain medications, with a sip of water before your appointment.  Diabetics should hold regular insulin (if taken separately) and take 1/2 normal NPH dose the morning of the procedure.  Carry some sugar containing items with you to your appointment. 4. A driver must accompany you and be prepared to drive you home after your procedure. 5. Bring all your current medications with you. 6. An IV may be inserted and sedation may be given at the discretion of the physician. 7. A blood pressure cuff, EKG and other monitors will often be applied during the procedure.  Some patients may need to have extra oxygen administered for a short period. 8. You will be asked to provide medical information, including your allergies and medications, prior to the procedure.  We must know immediately if you are taking blood thinners (like Coumadin/Warfarin) or if you are allergic to IV iodine contrast (dye).  We must know if you could possible be pregnant.  Possible side-effects:   Bleeding from needle site  Infection (rare, may require surgery)  Nerve injury (rare)  Numbness & tingling (temporary)  Difficulty urinating (rare, temporary)  Spinal headache (a headache worse with upright posture)  Light-headedness (temporary)  Pain at injection site (serveral days)  Decreased blood pressure (rare, temporary)  Weakness in arm/leg (temporary)  Pressure sensation in back/neck (temporary)   Call if you  experience:   Fever/chills associated with headache or increased back/neck pain  Headache worsened by an upright position  New onset, weakness or numbness of an extremity below the injection site  Hives or difficulty breathing (go to the emergency room)  Inflammation or drainage at the injection site(s)  Severe back/neck pain greater than usual  New symptoms which are concerning to you  Please note:  Although the local anesthetic injected can often make your back or neck feel good for several hours after the injection, the pain will likely return. It takes 3-7 days for steroids to work.  You may not notice any pain relief for at least one week.  If effective, we will often do a series of 2-3 injections spaced 3-6 weeks apart to maximally decrease your pain.  After the initial series, you may be a candidate for a more permanent nerve block of the facets.  If you have any questions, please call [phone] [place]  What are the risk, side effects and possible complications? Generally speaking, most procedures are safe.  However, with any procedure there are risks, side effects, and the possibility of complications.  The risks and complications are dependent upon the sites that are lesioned, or the type of nerve block to be performed.  The closer the procedure is to the spine, the more serious the risks are.  Great care is  taken when placing the radio frequency needles, block needles or lesioning probes, but sometimes complications can occur. 1. Infection: Any time there is an injection through the skin, there is a risk of infection.  This is why sterile conditions are used for these blocks.  There are four possible types of infection. 1. Localized skin infection. 2. Central Nervous System Infection-This can be in the form of Meningitis, which can be deadly. 3. Epidural Infections-This can be in the form of an epidural  abscess, which can cause pressure inside of the spine, causing compression of the spinal cord with subsequent paralysis. This would require an emergency surgery to decompress, and there are no guarantees that the patient would recover from the paralysis. 4. Discitis-This is an infection of the intervertebral discs.  It occurs in about 1% of discography procedures.  It is difficult to treat and it may lead to surgery.        2. Pain: the needles have to go through skin and soft tissues, will cause soreness.       3. Damage to internal structures:  The nerves to be lesioned may be near blood vessels or    other nerves which can be potentially damaged.       4. Bleeding: Bleeding is more common if the patient is taking blood thinners such as  aspirin, Coumadin, Ticiid, Plavix, etc., or if he/she have some genetic predisposition  such as hemophilia. Bleeding into the spinal canal can cause compression of the spinal  cord with subsequent paralysis.  This would require an emergency surgery to  decompress and there are no guarantees that the patient would recover from the  paralysis.       5. Pneumothorax:  Puncturing of a lung is a possibility, every time a needle is introduced in  the area of the chest or upper back.  Pneumothorax refers to free air around the  collapsed lung(s), inside of the thoracic cavity (chest cavity).  Another two possible  complications related to a similar event would include: Hemothorax and Chylothorax.   These are variations of the Pneumothorax, where instead of air around the collapsed  lung(s), you may have blood or chyle, respectively.       6. Spinal headaches: They may occur with any procedures in the area of the spine.       7. Persistent CSF (Cerebro-Spinal Fluid) leakage: This is a rare problem, but may occur  with prolonged intrathecal or epidural catheters either due to the formation of a fistulous  track or a dural tear.       8. Nerve damage: By working so close to the  spinal cord, there is always a possibility of  nerve damage, which could be as serious as a permanent spinal cord injury with  paralysis.       9. Death:  Although rare, severe deadly allergic reactions known as "Anaphylactic  reaction" can occur to any of the medications used.      10. Worsening of the symptoms:  We can always make thing worse.  What are the chances of something like this happening? Chances of any of this occuring are extremely low.  By statistics, you have more of a chance of getting killed in a motor vehicle accident: while driving to the hospital than any of the above occurring .  Nevertheless, you should be aware that they are possibilities.  In general, it is similar to taking a shower.  Everybody knows that you can slip,  hit your head and get killed.  Does that mean that you should not shower again?  Nevertheless always keep in mind that statistics do not mean anything if you happen to be on the wrong side of them.  Even if a procedure has a 1 (one) in a 1,000,000 (million) chance of going wrong, it you happen to be that one..Also, keep in mind that by statistics, you have more of a chance of having something go wrong when taking medications.  Who should not have this procedure? If you are on a blood thinning medication (e.g. Coumadin, Plavix, see list of "Blood Thinners"), or if you have an active infection going on, you should not have the procedure.  If you are taking any blood thinners, please inform your physician.  How should I prepare for this procedure?  Do not eat or drink anything at least six hours prior to the procedure.  Bring a driver with you .  It cannot be a taxi.  Come accompanied by an adult that can drive you back, and that is strong enough to help you if your legs get weak or numb from the local anesthetic.  Take all of your medicines the morning of the procedure with just enough water to swallow them.  If you have diabetes, make sure that you are  scheduled to have your procedure done first thing in the morning, whenever possible.  If you have diabetes, take only half of your insulin dose and notify our nurse that you have done so as soon as you arrive at the clinic.  If you are diabetic, but only take blood sugar pills (oral hypoglycemic), then do not take them on the morning of your procedure.  You may take them after you have had the procedure.  Do not take aspirin or any aspirin-containing medications, at least eleven (11) days prior to the procedure.  They may prolong bleeding.  Wear loose fitting clothing that may be easy to take off and that you would not mind if it got stained with Betadine or blood.  Do not wear any jewelry or perfume  Remove any nail coloring.  It will interfere with some of our monitoring equipment.  NOTE: Remember that this is not meant to be interpreted as a complete list of all possible complications.  Unforeseen problems may occur.  BLOOD THINNERS The following drugs contain aspirin or other products, which can cause increased bleeding during surgery and should not be taken for 2 weeks prior to and 1 week after surgery.  If you should need take something for relief of minor pain, you may take acetaminophen which is found in Tylenol,m Datril, Anacin-3 and Panadol. It is not blood thinner. The products listed below are.  Do not take any of the products listed below in addition to any listed on your instruction sheet.  A.P.C or A.P.C with Codeine Codeine Phosphate Capsules #3 Ibuprofen Ridaura  ABC compound Congesprin Imuran rimadil  Advil Cope Indocin Robaxisal  Alka-Seltzer Effervescent Pain Reliever and Antacid Coricidin or Coricidin-D  Indomethacin Rufen  Alka-Seltzer plus Cold Medicine Cosprin Ketoprofen S-A-C Tablets  Anacin Analgesic Tablets or Capsules Coumadin Korlgesic Salflex  Anacin Extra Strength Analgesic tablets or capsules CP-2 Tablets Lanoril Salicylate  Anaprox Cuprimine Capsules  Levenox Salocol  Anexsia-D Dalteparin Magan Salsalate  Anodynos Darvon compound Magnesium Salicylate Sine-off  Ansaid Dasin Capsules Magsal Sodium Salicylate  Anturane Depen Capsules Marnal Soma  APF Arthritis pain formula Dewitt's Pills Measurin Stanback  Argesic Dia-Gesic Meclofenamic Sulfinpyrazone  Arthritis Bayer Timed Release Aspirin Diclofenac Meclomen Sulindac  Arthritis pain formula Anacin Dicumarol Medipren Supac  Analgesic (Safety coated) Arthralgen Diffunasal Mefanamic Suprofen  Arthritis Strength Bufferin Dihydrocodeine Mepro Compound Suprol  Arthropan liquid Dopirydamole Methcarbomol with Aspirin Synalgos  ASA tablets/Enseals Disalcid Micrainin Tagament  Ascriptin Doan's Midol Talwin  Ascriptin A/D Dolene Mobidin Tanderil  Ascriptin Extra Strength Dolobid Moblgesic Ticlid  Ascriptin with Codeine Doloprin or Doloprin with Codeine Momentum Tolectin  Asperbuf Duoprin Mono-gesic Trendar  Aspergum Duradyne Motrin or Motrin IB Triminicin  Aspirin plain, buffered or enteric coated Durasal Myochrisine Trigesic  Aspirin Suppositories Easprin Nalfon Trillsate  Aspirin with Codeine Ecotrin Regular or Extra Strength Naprosyn Uracel  Atromid-S Efficin Naproxen Ursinus  Auranofin Capsules Elmiron Neocylate Vanquish  Axotal Emagrin Norgesic Verin  Azathioprine Empirin or Empirin with Codeine Normiflo Vitamin E  Azolid Emprazil Nuprin Voltaren  Bayer Aspirin plain, buffered or children's or timed BC Tablets or powders Encaprin Orgaran Warfarin Sodium  Buff-a-Comp Enoxaparin Orudis Zorpin  Buff-a-Comp with Codeine Equegesic Os-Cal-Gesic   Buffaprin Excedrin plain, buffered or Extra Strength Oxalid   Bufferin Arthritis Strength Feldene Oxphenbutazone   Bufferin plain or Extra Strength Feldene Capsules Oxycodone with Aspirin   Bufferin with Codeine Fenoprofen Fenoprofen Pabalate or Pabalate-SF   Buffets II Flogesic Panagesic   Buffinol plain or Extra Strength Florinal or Florinal with  Codeine Panwarfarin   Buf-Tabs Flurbiprofen Penicillamine   Butalbital Compound Four-way cold tablets Penicillin   Butazolidin Fragmin Pepto-Bismol   Carbenicillin Geminisyn Percodan   Carna Arthritis Reliever Geopen Persantine   Carprofen Gold's salt Persistin   Chloramphenicol Goody's Phenylbutazone   Chloromycetin Haltrain Piroxlcam   Clmetidine heparin Plaquenil   Cllnoril Hyco-pap Ponstel   Clofibrate Hydroxy chloroquine Propoxyphen         Before stopping any of these medications, be sure to consult the physician who ordered them.  Some, such as Coumadin (Warfarin) are ordered to prevent or treat serious conditions such as "deep thrombosis", "pumonary embolisms", and other heart problems.  The amount of time that you may need off of the medication may also vary with the medication and the reason for which you were taking it.  If you are taking any of these medications, please make sure you notify your pain physician before you undergo any procedures.

## 2017-03-13 NOTE — Progress Notes (Signed)
Patient's Name: Jacqueline Thornton  MRN: [id number]  Referring Provider: Lavera Guise, MD  DOB: 02-Feb-1951  PCP: Lavera Guise, MD  DOS: 03/13/2017  Note by: Gaspar Cola, MD  Service setting: Ambulatory outpatient  Specialty: Interventional Pain Management  Patient type: Established  Location: [facility] (AMB) Pain Management Facility  Visit type: Interventional Procedure   Primary Reason for Visit: Interventional Pain Management Treatment. CC: Back Pain (lower right and mid) and Hip Pain (left)  Procedure:  Anesthesia, Analgesia, Anxiolysis:  Procedure #1: Type: Palliative Medial Branch Facet Block Region: Lumbar Level: L2, L3, L4, L5, & S1 Medial Branch Level(s) Laterality: Right  Procedure #2: Type: Palliative Sacroiliac Joint Block Region: Posterior Lumbosacral Level: PSIS (Posterior Superior Iliac Spine) Sacroiliac Joint Laterality: Bilateral  Type: Local Anesthesia with Moderate (Conscious) Sedation Local Anesthetic: Lidocaine 1% Route: Intravenous (IV) IV Access: Secured Sedation: Meaningful verbal contact was maintained at all times during the procedure  Indication(s): Analgesia and Anxiety  Indications: 1. Lumbar facet syndrome (Bilateral) (R>L)   2. Lumbar facet hypertrophy   3. Spondylosis of lumbar spine   4. Chronic sacroiliac joint pain (Bilateral) (L>R)   5. Chronic low back pain (Location of Secondary source of pain) (Bilateral) (midline to tailbone) (R>L)    Pain Score: Pre-procedure: 5 /10 Post-procedure: 1 /10  Pre-op Assessment:  Jacqueline Thornton is a 66 y.o. (year old), female patient, seen today for interventional treatment. She  has a past surgical history that includes Cholecystectomy; Abdominal hysterectomy; Neck surgery (Bilateral); Appendectomy; Neck surgery; Shoulder surgery; Colonoscopy with propofol (N/A, 07/19/2015); and caract surger. [name] has a current medication list which includes the following prescription(s): acetaminophen, alendronate,  bromfenac sodium, cholecalciferol, cyclobenzaprine, difluprednate, duloxetine, enalapril, esomeprazole, estradiol, fe fum-fa-b cmp-c-zn-mg-mn-cu, fenofibrate, gabapentin, magnesium oxide (antacid), ofloxacin, oxycodone-acetaminophen, oxycodone-acetaminophen, oxycodone-acetaminophen, and rosuvastatin, and the following Facility-Administered Medications: fentanyl and midazolam. Her primarily concern today is the Back Pain (lower right and mid) and Hip Pain (left)  Initial Vital Signs: Blood pressure (!) 106/58, pulse 74, temperature 98.6 F (37 C), resp. rate 18, height 5' (1.524 m), weight 148 lb (67.1 kg), SpO2 97 %. BMI: Estimated body mass index is 28.9 kg/m as calculated from the following:   Height as of this encounter: 5' (1.524 m).   Weight as of this encounter: 148 lb (67.1 kg).  Risk Assessment: Allergies: Reviewed. She has No Known Allergies.  Allergy Precautions: None required Coagulopathies: Reviewed. None identified.  Blood-thinner therapy: None at this time Active Infection(s): Reviewed. None identified. Jacqueline Thornton is afebrile  Site Confirmation: Jacqueline Thornton was asked to confirm the procedure and laterality before marking the site Procedure checklist: Completed Consent: Before the procedure and under the influence of no sedative(s), amnesic(s), or anxiolytics, the patient was informed of the treatment options, risks and possible complications. To fulfill our ethical and legal obligations, as recommended by the American Medical Association's Code of Ethics, I have informed the patient of my clinical impression; the nature and purpose of the treatment or procedure; the risks, benefits, and possible complications of the intervention; the alternatives, including doing nothing; the risk(s) and benefit(s) of the alternative treatment(s) or procedure(s); and the risk(s) and benefit(s) of doing nothing. The patient was provided information about the general risks and possible complications  associated with the procedure. These may include, but are not limited to: failure to achieve desired goals, infection, bleeding, organ or nerve damage, allergic reactions, paralysis, and death. In addition, the patient was informed of those risks and complications associated to  Spine-related procedures, such as failure to decrease pain; infection (i.e.: Meningitis, epidural or intraspinal abscess); bleeding (i.e.: epidural hematoma, subarachnoid hemorrhage, or any other type of intraspinal or peri-dural bleeding); organ or nerve damage (i.e.: Any type of peripheral nerve, nerve root, or spinal cord injury) with subsequent damage to sensory, motor, and/or autonomic systems, resulting in permanent pain, numbness, and/or weakness of one or several areas of the body; allergic reactions; (i.e.: anaphylactic reaction); and/or death. Furthermore, the patient was informed of those risks and complications associated with the medications. These include, but are not limited to: allergic reactions (i.e.: anaphylactic or anaphylactoid reaction(s)); adrenal axis suppression; blood sugar elevation that in diabetics may result in ketoacidosis or comma; water retention that in patients with history of congestive heart failure may result in shortness of breath, pulmonary edema, and decompensation with resultant heart failure; weight gain; swelling or edema; medication-induced neural toxicity; particulate matter embolism and blood vessel occlusion with resultant organ, and/or nervous system infarction; and/or aseptic necrosis of one or more joints. Finally, the patient was informed that Medicine is not an exact science; therefore, there is also the possibility of unforeseen or unpredictable risks and/or possible complications that may result in a catastrophic outcome. The patient indicated having understood very clearly. We have given the patient no guarantees and we have made no promises. Enough time was given to the patient to  ask questions, all of which were answered to the patient's satisfaction. Jacqueline Thornton has indicated that she wanted to continue with the procedure. Attestation: I, the ordering provider, attest that I have discussed with the patient the benefits, risks, side-effects, alternatives, likelihood of achieving goals, and potential problems during recovery for the procedure that I have provided informed consent. Date: 03/13/2017; Time: 10:28 AM  Pre-Procedure Preparation:  Monitoring: As per clinic protocol. Respiration, ETCO2, SpO2, BP, heart rate and rhythm monitor placed and checked for adequate function Safety Precautions: Patient was assessed for positional comfort and pressure points before starting the procedure. Time-out: I initiated and conducted the "Time-out" before starting the procedure, as per protocol. The patient was asked to participate by confirming the accuracy of the "Time Out" information. Verification of the correct person, site, and procedure were performed and confirmed by me, the nursing staff, and the patient. "Time-out" conducted as per Joint Commission's Universal Protocol (UP.01.01.01). "Time-out" Date & Time: 03/13/2017; 1058 hrs.  Description of Procedure #1 Process:   Time-out: "Time-out" completed before starting procedure, as per protocol. Position: Prone Target Area: For Lumbar Facet blocks, the target is the groove formed by the junction of the transverse process and superior articular process. For the L5 dorsal ramus, the target is the notch between superior articular process and sacral ala. For the S1 dorsal ramus, the target is the superior and lateral edge of the posterior S1 Sacral foramen. Approach: Paramedial approach. Area Prepped: Entire Posterior Lumbosacral Region Prepping solution: ChloraPrep (2% chlorhexidine gluconate and 70% isopropyl alcohol) Safety Precautions: Aspiration looking for blood return was conducted prior to all injections. At no point did we  inject any substances, as a needle was being advanced. No attempts were made at seeking any paresthesias. Safe injection practices and needle disposal techniques used. Medications properly checked for expiration dates. SDV (single dose vial) medications used.  Description of the Procedure: Protocol guidelines were followed. The patient was placed in position over the fluoroscopy table. The target area was identified and the area prepped in the usual manner. Skin desensitized using vapocoolant spray. Skin & deeper tissues infiltrated  with local anesthetic. Appropriate amount of time allowed to pass for local anesthetics to take effect. The procedure needle was introduced through the skin, ipsilateral to the reported pain, and advanced to the target area. Employing the "Medial Branch Technique", the needles were advanced to the angle made by the superior and medial portion of the transverse process, and the lateral and inferior portion of the superior articulating process of the targeted vertebral bodies. This area is known as "Burton's Eye" or the "Eye of the [place] Dog". A procedure needle was introduced through the skin, and this time advanced to the angle made by the superior and medial border of the sacral ala, and the lateral border of the S1 vertebral body. This last needle was later repositioned at the superior and lateral border of the posterior S1 foramen. Negative aspiration confirmed. Solution injected in intermittent fashion, asking for systemic symptoms every 0.5cc of injectate. The needles were then removed and the area cleansed, making sure to leave some of the prepping solution back to take advantage of its long term bactericidal properties. Start Time: 1059 hrs. Materials:  Needle(s) Type: Regular needle Gauge: 22G Length: 3.5-in Medication(s): We administered lactated ringers, midazolam, fentaNYL, lidocaine, triamcinolone acetonide, ropivacaine (PF) 2 mg/mL (0.2%), methylPREDNISolone  acetate, and ropivacaine (PF) 2 mg/mL (0.2%). Please see chart orders for dosing details.  Description of Procedure # 2 Process:   Position: Prone Target Area: For upper sacroiliac joint block(s), the target is the superior and posterior margin of the sacroiliac joint. Approach: Ipsilateral approach. Area Prepped: Entire Posterior Lumbosacral Region Prepping solution: ChloraPrep (2% chlorhexidine gluconate and 70% isopropyl alcohol) Safety Precautions: Aspiration looking for blood return was conducted prior to all injections. At no point did we inject any substances, as a needle was being advanced. No attempts were made at seeking any paresthesias. Safe injection practices and needle disposal techniques used. Medications properly checked for expiration dates. SDV (single dose vial) medications used. Description of the Procedure: Protocol guidelines were followed. The patient was placed in position over the fluoroscopy table. The target area was identified and the area prepped in the usual manner. Skin desensitized using vapocoolant spray. Skin & deeper tissues infiltrated with local anesthetic. Appropriate amount of time allowed to pass for local anesthetics to take effect. The procedure needle was advanced under fluoroscopic guidance into the sacroiliac joint until a firm endpoint was obtained. Proper needle placement secured. Negative aspiration confirmed. Solution injected in intermittent fashion, asking for systemic symptoms every 0.5cc of injectate. The needles were then removed and the area cleansed, making sure to leave some of the prepping solution back to take advantage of its long term bactericidal properties. Vitals:   03/13/17 1118 03/13/17 1128 03/13/17 1138 03/13/17 1148  BP: 129/60 126/61 121/89 (!) 121/59  Pulse:      Resp: 14 14 16 16   Temp: 97.6 F (36.4 C)   97.7 F (36.5 C)  SpO2: 98% 98% 98% 97%  Weight:      Height:        End Time: 1111 hrs. Materials:  Needle(s) Type:  Regular needle Gauge: 22G Length: 3.5-in Medication(s): We administered lactated ringers, midazolam, fentaNYL, lidocaine, triamcinolone acetonide, ropivacaine (PF) 2 mg/mL (0.2%), methylPREDNISolone acetate, and ropivacaine (PF) 2 mg/mL (0.2%). Please see chart orders for dosing details.  Imaging Guidance (Spinal):  Type of Imaging Technique: Fluoroscopy Guidance (Spinal) Indication(s): Assistance in needle guidance and placement for procedures requiring needle placement in or near specific anatomical locations not easily accessible without such assistance.  Exposure Time: Please see nurses notes. Contrast: None used. Fluoroscopic Guidance: I was personally present during the use of fluoroscopy. "Tunnel Vision Technique" used to obtain the best possible view of the target area. Parallax error corrected before commencing the procedure. "Direction-depth-direction" technique used to introduce the needle under continuous pulsed fluoroscopy. Once target was reached, antero-posterior, oblique, and lateral fluoroscopic projection used confirm needle placement in all planes. Images permanently stored in EMR. Interpretation: No contrast injected. I personally interpreted the imaging intraoperatively. Adequate needle placement confirmed in multiple planes. Permanent images saved into the patient's record.  Antibiotic Prophylaxis:  Indication(s): None identified Antibiotic given: None  Post-operative Assessment:  EBL: None Complications: No immediate post-treatment complications observed by team, or reported by patient. Note: The patient tolerated the entire procedure well. A repeat set of vitals were taken after the procedure and the patient was kept under observation following institutional policy, for this type of procedure. Post-procedural neurological assessment was performed, showing return to baseline, prior to discharge. The patient was provided with post-procedure discharge instructions, including a  section on how to identify potential problems. Should any problems arise concerning this procedure, the patient was given instructions to immediately contact us, at any time, without hesitation. In any case, we plan to contact the patient by telephone for a follow-up status report regarding this interventional procedure. Comments:  No additional relevant information.  Plan of Care  Disposition: Discharge home  Discharge Date & Time: 03/13/2017; 1200 hrs.   Physician-requested Follow-up:  Return for post-procedure eval by Crystal, in 2 weeks.  Future Appointments Date Time Provider [place]  03/20/2017 4:00 PM [facility]  03/27/2017 10:30 AM Vevelyn Francois, NP [facility] None  06/10/2017 9:00 AM Vevelyn Francois, NP [facility] None    Imaging Orders     DG C-Arm 1-60 Min-No Report  Procedure Orders     LUMBAR FACET(MEDIAL BRANCH NERVE BLOCK) MBNB     SACROILIAC JOINT INJECTINS  Medications ordered for procedure: Meds ordered this encounter  Medications  . lactated ringers infusion 1,000 mL  . midazolam (VERSED) 5 MG/5ML injection 1-2 mg    Make sure Flumazenil is available in the pyxis when using this medication. If oversedation occurs, administer 0.2 mg IV over 15 sec. If after 45 sec no response, administer 0.2 mg again over 1 min; may repeat at 1 min intervals; not to exceed 4 doses (1 mg)  . fentaNYL (SUBLIMAZE) injection 25-50 mcg    Make sure Narcan is available in the pyxis when using this medication. In the event of respiratory depression (RR< 8/min): Titrate NARCAN (naloxone) in increments of 0.1 to 0.2 mg IV at 2-3 minute intervals, until desired degree of reversal.  . lidocaine (XYLOCAINE) 2 % (with pres) injection 200 mg  . triamcinolone acetonide (KENALOG-40) injection 40 mg  . ropivacaine (PF) 2 mg/mL (0.2%) (NAROPIN) injection 9 mL  . methylPREDNISolone acetate (DEPO-MEDROL) injection 80 mg  . ropivacaine (PF) 2 mg/mL (0.2%) (NAROPIN) injection 9 mL    Medications administered: We administered lactated ringers, midazolam, fentaNYL, lidocaine, triamcinolone acetonide, ropivacaine (PF) 2 mg/mL (0.2%), methylPREDNISolone acetate, and ropivacaine (PF) 2 mg/mL (0.2%).  See the medical record for exact dosing, route, and time of administration.  New Prescriptions   No medications on file   Primary Care Physician: Lavera Guise, MD Location: [facility] Note by: Gaspar Cola, MD Date: 03/13/2017; Time: 12:31 AM  Disclaimer:  Medicine is not an exact science. The only guarantee  in medicine is that nothing is guaranteed. It is important to note that the decision to proceed with this intervention was based on the information collected from the patient. The Data and conclusions were drawn from the patient's questionnaire, the interview, and the physical examination. Because the information was provided in large part by the patient, it cannot be guaranteed that it has not been purposely or unconsciously manipulated. Every effort has been made to obtain as much relevant data as possible for this evaluation. It is important to note that the conclusions that lead to this procedure are derived in large part from the available data. Always take into account that the treatment will also be dependent on availability of resources and existing treatment guidelines, considered by other Pain Management Practitioners as being common knowledge and practice, at the time of the intervention. For Medico-Legal purposes, it is also important to point out that variation in procedural techniques and pharmacological choices are the acceptable norm. The indications, contraindications, technique, and results of the above procedure should only be interpreted and judged by a Board-Certified Interventional Pain Specialist with extensive familiarity and expertise in the same exact procedure and technique.

## 2017-03-14 ENCOUNTER — Telehealth: Payer: Self-pay

## 2017-03-14 NOTE — Telephone Encounter (Signed)
Post procedure phone call. Patient states she is doing good.  

## 2017-03-15 LAB — TOXASSURE SELECT 13 (MW), URINE

## 2017-03-20 ENCOUNTER — Ambulatory Visit
Admission: RE | Admit: 2017-03-20 | Discharge: 2017-03-20 | Disposition: A | Discharge status: Home / Self Care | Payer: Medicare Other | Source: Ambulatory Visit | Attending: Internal Medicine | Admitting: Internal Medicine

## 2017-03-20 DIAGNOSIS — Z1231 Encounter for screening mammogram for malignant neoplasm of breast: Secondary | ICD-10-CM

## 2017-03-27 ENCOUNTER — Ambulatory Visit: Payer: Medicare Other | Admitting: Nurse Practitioner

## 2017-03-31 ENCOUNTER — Ambulatory Visit: Payer: Medicare Other | Attending: Nurse Practitioner | Admitting: Nurse Practitioner

## 2017-04-02 ENCOUNTER — Ambulatory Visit: Payer: Medicare Other | Admitting: Pain Medicine

## 2017-06-10 ENCOUNTER — Encounter: Payer: Medicare Other | Admitting: Nurse Practitioner

## 2017-06-13 ENCOUNTER — Other Ambulatory Visit: Payer: Self-pay

## 2017-06-13 ENCOUNTER — Ambulatory Visit: Payer: Medicare Other | Attending: Nurse Practitioner | Admitting: Nurse Practitioner

## 2017-06-13 ENCOUNTER — Encounter: Payer: Self-pay | Admitting: Nurse Practitioner

## 2017-06-13 VITALS — BP 125/64 | HR 84 | Temp 98.0°F | Resp 16 | Ht 60.0 in | Wt 149.0 lb

## 2017-06-13 DIAGNOSIS — M533 Sacrococcygeal disorders, not elsewhere classified: Secondary | ICD-10-CM | POA: Insufficient documentation

## 2017-06-13 DIAGNOSIS — M25552 Pain in left hip: Secondary | ICD-10-CM | POA: Diagnosis not present

## 2017-06-13 DIAGNOSIS — F1120 Opioid dependence, uncomplicated: Secondary | ICD-10-CM | POA: Insufficient documentation

## 2017-06-13 DIAGNOSIS — H25812 Combined forms of age-related cataract, left eye: Secondary | ICD-10-CM | POA: Diagnosis not present

## 2017-06-13 DIAGNOSIS — M47816 Spondylosis without myelopathy or radiculopathy, lumbar region: Secondary | ICD-10-CM | POA: Insufficient documentation

## 2017-06-13 DIAGNOSIS — G8929 Other chronic pain: Secondary | ICD-10-CM

## 2017-06-13 DIAGNOSIS — E785 Hyperlipidemia, unspecified: Secondary | ICD-10-CM | POA: Insufficient documentation

## 2017-06-13 DIAGNOSIS — M542 Cervicalgia: Secondary | ICD-10-CM | POA: Diagnosis not present

## 2017-06-13 DIAGNOSIS — M7061 Trochanteric bursitis, right hip: Secondary | ICD-10-CM | POA: Insufficient documentation

## 2017-06-13 DIAGNOSIS — Z981 Arthrodesis status: Secondary | ICD-10-CM | POA: Insufficient documentation

## 2017-06-13 DIAGNOSIS — Z961 Presence of intraocular lens: Secondary | ICD-10-CM | POA: Insufficient documentation

## 2017-06-13 DIAGNOSIS — G894 Chronic pain syndrome: Secondary | ICD-10-CM | POA: Insufficient documentation

## 2017-06-13 DIAGNOSIS — K219 Gastro-esophageal reflux disease without esophagitis: Secondary | ICD-10-CM | POA: Insufficient documentation

## 2017-06-13 DIAGNOSIS — M47812 Spondylosis without myelopathy or radiculopathy, cervical region: Secondary | ICD-10-CM | POA: Diagnosis not present

## 2017-06-13 DIAGNOSIS — M7062 Trochanteric bursitis, left hip: Secondary | ICD-10-CM | POA: Insufficient documentation

## 2017-06-13 DIAGNOSIS — M5380 Other specified dorsopathies, site unspecified: Secondary | ICD-10-CM | POA: Diagnosis not present

## 2017-06-13 DIAGNOSIS — M1610 Unilateral primary osteoarthritis, unspecified hip: Secondary | ICD-10-CM | POA: Insufficient documentation

## 2017-06-13 DIAGNOSIS — M7918 Myalgia, other site: Secondary | ICD-10-CM | POA: Insufficient documentation

## 2017-06-13 DIAGNOSIS — M25559 Pain in unspecified hip: Secondary | ICD-10-CM | POA: Diagnosis present

## 2017-06-13 DIAGNOSIS — M549 Dorsalgia, unspecified: Secondary | ICD-10-CM | POA: Diagnosis present

## 2017-06-13 DIAGNOSIS — Z79891 Long term (current) use of opiate analgesic: Secondary | ICD-10-CM | POA: Insufficient documentation

## 2017-06-13 DIAGNOSIS — M25551 Pain in right hip: Secondary | ICD-10-CM | POA: Insufficient documentation

## 2017-06-13 DIAGNOSIS — M545 Low back pain: Secondary | ICD-10-CM

## 2017-06-13 DIAGNOSIS — G43909 Migraine, unspecified, not intractable, without status migrainosus: Secondary | ICD-10-CM | POA: Insufficient documentation

## 2017-06-13 DIAGNOSIS — Z79899 Other long term (current) drug therapy: Secondary | ICD-10-CM | POA: Insufficient documentation

## 2017-06-13 DIAGNOSIS — I2510 Atherosclerotic heart disease of native coronary artery without angina pectoris: Secondary | ICD-10-CM | POA: Insufficient documentation

## 2017-06-13 MED ORDER — OXYCODONE-ACETAMINOPHEN 7.5-325 MG PO TABS: 1.0000 | ORAL_TABLET | Freq: Every day | ORAL | 0 refills | Status: DC | PRN

## 2017-06-13 NOTE — Progress Notes (Signed)
Nursing Pain Medication Assessment:  Safety precautions to be maintained throughout the outpatient stay will include: orient to surroundings, keep bed in low position, maintain call bell within reach at all times, provide assistance with transfer out of bed and ambulation.  Medication Inspection Compliance: Pill count conducted under aseptic conditions, in front of the patient. Neither the pills nor the bottle was removed from the patient's sight at any time. Once count was completed pills were immediately returned to the patient in their original bottle.  Medication: Oxycodone/APAP Pill/Patch Count: 48 of 150 pills remain Pill/Patch Appearance: Markings consistent with prescribed medication Bottle Appearance: Standard pharmacy container. Clearly labeled. Filled Date: 62 / 25 / 2018 Last Medication intake:  Today

## 2017-06-13 NOTE — Progress Notes (Signed)
Patient's Name: Jacqueline Thornton  MRN: [id number]  Referring Provider: Lavera Guise, MD  DOB: 06-Feb-1951  PCP: Lavera Guise, MD  DOS: 06/13/2017  Note by: Vevelyn Francois NP  Service setting: Ambulatory outpatient  Specialty: Interventional Pain Management  Location: [facility] (AMB) Pain Management Facility    Patient type: Established    Primary Reason(s) for Visit: Encounter for prescription drug management & post-procedure evaluation of chronic illness with mild to moderate exacerbation(Level of risk: moderate) CC: Back Pain and Hip Pain  HPI  [name] is a 66 y.o. year old, female patient, who comes today for a post-procedure evaluation and medication management. She has Chronic pain syndrome; Gout; Hyperlipidemia; Headache, migraine; Panic attack; Chronic low back pain (Location of Secondary source of pain) (Bilateral) (midline to tailbone) (R>L); Spondylosis of lumbar spine;  Lumbar annular disc tear (L4-5); Discogenic low back pain (L3-4 and L4-5); Lumbar facet hypertrophy; Lumbar facet syndrome (Bilateral) (R>L); Chronic neck pain; Cervical spondylosis; Hx of cervical spine surgery; Cervical spinal fusion (C6-7 interbody fusion); Coronary atherosclerosis; Long term current use of opiate analgesic; Encounter for therapeutic drug level monitoring; Uncomplicated opioid dependence (Grandfather); Opiate use (56.25 MME/Day); CAD in native artery; Anxiety, generalized; Chronic sacroiliac joint pain (Bilateral) (L>R); Chronic lumbar radicular pain (Location of Primary Source of Pain) (Left) (S1 Dermatome); Long term prescription opiate use; Encounter for chronic pain management; Trochanteric bursitis of right hip; Chronic hip pain (Bilateral) (L>R); Neurogenic pain; Myofascial pain; Osteoarthritis of hip (Left); Arthrodesis status; Degenerative disk disease; Myalgia; Trochanteric bursitis of hip (Bilateral) (L>R); Age-related nuclear cataract of left eye; Posterior subcapsular polar age-related cataract of left  eye; Pseudophakia of right eye; and Pseudophakia of left eye on their problem list. Her primarily concern today is the Back Pain and Hip Pain  Pain Assessment: Location: Lower Back(hips) Radiating: radiates into both buttocks and hips Onset: More than a month ago Duration: Chronic pain Quality: Aching Severity: 4 /10 (self-reported pain score)  Note: Reported level is compatible with observation.                          Effect on ADL:   Timing: Constant Modifying factors: rest  [name] was last seen on 03/31/2017 for a procedure. During today's appointment we reviewed [name]'s post-procedure results, as well as her outpatient medication regimen. She is SP bilateral sacroiliac joint block and Right sided lumbar facet . She states that left hip is the worse. She admits that it goes down. She admits that she "over did things". She states that she did pick up some boxes.   Further details on both, my assessment(s), as well as the proposed treatment plan, please see below.  Controlled Substance Pharmacotherapy Assessment REMS (Risk Evaluation and Mitigation Strategy)  Analgesic:Oxycodone 7.5/325 one tablet 5 times a day. (56.25 mg/day) MME/day:56.25 mg/day    Dewayne Shorter, RN  06/13/2017  8:51 AM  Signed Nursing Pain Medication Assessment:  Safety precautions to be maintained throughout the outpatient stay will include: orient to surroundings, keep bed in low position, maintain call bell within reach at all times, provide assistance with transfer out of bed and ambulation.  Medication Inspection Compliance: Pill count conducted under aseptic conditions, in front of the patient. Neither the pills nor the bottle was removed from the patient's sight at any time. Once count was completed pills were immediately returned to the patient in their original bottle.  Medication: Oxycodone/APAP Pill/Patch Count: 48 of 150 pills  remain Pill/Patch Appearance: Markings consistent with  prescribed medication Bottle Appearance: Standard pharmacy container. Clearly labeled. Filled Date: 51 / 25 / 2018 Last Medication intake:  Today   Pharmacokinetics: Liberation and absorption (onset of action): WNL Distribution (time to peak effect): WNL Metabolism and excretion (duration of action): WNL         Pharmacodynamics: Desired effects: Analgesia: [name] reports >50% benefit. Functional ability: Patient reports that medication allows her to accomplish basic ADLs Clinically meaningful improvement in function (CMIF): Sustained CMIF goals met Perceived effectiveness: Described as relatively effective, allowing for increase in activities of daily living (ADL) Undesirable effects: Side-effects or Adverse reactions: None reported Monitoring: [place] PMP: Online review of the past 49-monthperiod conducted. Compliant with practice rules and regulations Last UDS on record: Summary  Date Value Ref Range Status  03/11/2017 FINAL  Final    Comment:    ==================================================================== TOXASSURE SELECT 13 (MW) ==================================================================== Test                             Result       Flag       Units Drug Present and Declared for Prescription Verification   Oxycodone                      3245         EXPECTED   ng/mg creat   Oxymorphone                    1353         EXPECTED   ng/mg creat   Noroxycodone                   2883         EXPECTED   ng/mg creat   Noroxymorphone                 185          EXPECTED   ng/mg creat    Sources of oxycodone are scheduled prescription medications.    Oxymorphone, noroxycodone, and noroxymorphone are expected    metabolites of oxycodone. Oxymorphone is also available as a    scheduled prescription medication. ==================================================================== Test                      Result    Flag   Units      Ref Range   Creatinine               47               mg/dL      >=20 ==================================================================== Declared Medications:  The flagging and interpretation on this report are based on the  following declared medications.  Unexpected results may arise from  inaccuracies in the declared medications.  **Note: The testing scope of this panel includes these medications:  Oxycodone (Percocet)  **Note: The testing scope of this panel does not include following  reported medications:  Acetaminophen (Percocet)  Acetaminophen (Tylenol)  Alendronate (Fosamax)  Bromfenac  Cyclobenzaprine (Flexeril)  Duloxetine (Cymbalta)  Enalapril (Vasotec)  Estradiol (Estrace)  Fenofibrate  Gabapentin (Neurontin)  Magnesium Oxide  Ofloxaxin  Omeprazole (Nexium)  Rosuvastatin (Crestor)  Topical  Vitamin D ==================================================================== For clinical consultation, please call [phone] ====================================================================    UDS interpretation: Compliant          Medication Assessment Form: Reviewed. Patient  indicates being compliant with therapy Treatment compliance: Compliant Risk Assessment Profile: Aberrant behavior: See prior evaluations. None observed or detected today Comorbid factors increasing risk of overdose: See prior notes. No additional risks detected today Risk of substance use disorder (SUD): Low Opioid Risk Tool - 06/13/17 0848      Family History of Substance Abuse   Alcohol  Negative    Illegal Drugs  Negative    Rx Drugs  Negative      Personal History of Substance Abuse   Alcohol  Negative    Illegal Drugs  Negative    Rx Drugs  Negative      Age   Age between 50-45 years   No      Psychological Disease   Psychological Disease  Negative    Depression  Positive      Total Score   Opioid Risk Tool Scoring  1    Opioid Risk Interpretation  Low Risk      ORT Scoring interpretation table:   Score <3 = Low Risk for SUD  Score between 4-7 = Moderate Risk for SUD  Score >8 = High Risk for Opioid Abuse   Risk Mitigation Strategies:  Patient Counseling: Covered Patient-Prescriber Agreement (PPA): Present and active  Notification to other healthcare providers: Done  Pharmacologic Plan: No change in therapy, at this time  Post-Procedure Assessment  03/13/2017 Procedure: bilateral sacroiliac joint block and Right sided lumbar facet  Pre-procedure pain score:  5/10 Post-procedure pain score: 1/10         Influential Factors: BMI: 29.10 kg/m Intra-procedural challenges: None observed.         Assessment challenges: None detected.              Reported side-effects: None.        Post-procedural adverse reactions or complications: None reported         Sedation: Please see nurses note. When no sedatives are used, the analgesic levels obtained are directly associated to the effectiveness of the local anesthetics. However, when sedation is provided, the level of analgesia obtained during the initial 1 hour following the intervention, is believed to be the result of a combination of factors. These factors may include, but are not limited to: 1. The effectiveness of the local anesthetics used. 2. The effects of the analgesic(s) and/or anxiolytic(s) used. 3. The degree of discomfort experienced by the patient at the time of the procedure. 4. The patients ability and reliability in recalling and recording the events. 5. The presence and influence of possible secondary gains and/or psychosocial factors. Reported result: Relief experienced during the 1st hour after the procedure: 100 % (Ultra-Short Term Relief)            Interpretative annotation: Clinically appropriate result. Analgesia during this period is likely to be Local Anesthetic and/or IV Sedative (Analgesic/Anxiolytic) related.          Effects of local anesthetic: The analgesic effects attained during this period are directly  associated to the localized infiltration of local anesthetics and therefore cary significant diagnostic value as to the etiological location, or anatomical origin, of the pain. Expected duration of relief is directly dependent on the pharmacodynamics of the local anesthetic used. Long-acting (4-6 hours) anesthetics used.  Reported result: Relief during the next 4 to 6 hour after the procedure: 75 % (Short-Term Relief)            Interpretative annotation: Clinically appropriate result. Analgesia during this period is likely to be Local  Anesthetic-related.          Long-term benefit: Defined as the period of time past the expected duration of local anesthetics (1 hour for short-acting and 4-6 hours for long-acting). With the possible exception of prolonged sympathetic blockade from the local anesthetics, benefits during this period are typically attributed to, or associated with, other factors such as analgesic sensory neuropraxia, antiinflammatory effects, or beneficial biochemical changes provided by agents other than the local anesthetics.  Reported result: Extended relief following procedure: 75 %(lasting 2 months) (Long-Term Relief)            Interpretative annotation: Clinically appropriate result. Good relief. No permanent benefit expected. Inflammation plays a part in the etiology to the pain.          Current benefits: Defined as reported results that persistent at this point in time.   Analgesia: >50 %            Function: Somewhat improved ROM: Somewhat improved Interpretative annotation: Recurrence of symptoms. No permanent benefit expected. Effective therapeutic approach.          Interpretation: Results would suggest a successful therapeutic intervention.                  Plan:  Please see "Plan of Care" for details.        Laboratory Chemistry  Inflammation Markers (CRP: Acute Phase) (ESR: Chronic Phase) Lab Results  Component Value Date   CRP 4.8 12/05/2016   ESRSEDRATE 2  12/05/2016                 Rheumatology Markers No results found for: Elayne Guerin, [facility]              Renal Function Markers Lab Results  Component Value Date   BUN 8 12/05/2016   CREATININE 0.88 12/05/2016   GFRAA 80 12/05/2016   GFRNONAA 70 12/05/2016                 Hepatic Function Markers Lab Results  Component Value Date   AST 19 12/05/2016   ALT 13 12/05/2016   ALBUMIN 3.7 12/05/2016   ALKPHOS 48 12/05/2016                 Electrolytes Lab Results  Component Value Date   NA 140 12/05/2016   K 4.0 12/05/2016   CL 103 12/05/2016   CALCIUM 9.3 12/05/2016   MG 2.2 12/05/2016                 Neuropathy Markers Lab Results  Component Value Date   VITAMINB12 202 (L) 12/05/2016                 Bone Pathology Markers Lab Results  Component Value Date   25OHVITD1 50 12/05/2016   25OHVITD2 <1.0 12/05/2016   25OHVITD3 49 12/05/2016                 Coagulation Parameters Lab Results  Component Value Date   INR 1.0 03/02/2014   LABPROT 12.7 03/02/2014   PLT 197 01/26/2012                 Cardiovascular Markers Lab Results  Component Value Date   HGB 14.2 01/26/2012   HCT 41.1 01/26/2012                 CA Markers No results found for: CEA, CA125, LABCA2  Note: Lab results reviewed.  Recent Diagnostic Imaging Results  MM SCREENING BREAST TOMO BILATERAL CLINICAL DATA:  Screening.  EXAM: 2D DIGITAL SCREENING BILATERAL MAMMOGRAM WITH CAD AND ADJUNCT TOMO  COMPARISON:  Previous exam(s).  ACR Breast Density Category b: There are scattered areas of fibroglandular density.  FINDINGS: There are no findings suspicious for malignancy. Images were processed with CAD.  IMPRESSION: No mammographic evidence of malignancy. A result letter of this screening mammogram will be mailed directly to the patient.  RECOMMENDATION: Screening mammogram in one year. (Code:[id number])  BI-RADS CATEGORY  1:  Negative.  Electronically Signed   By: Kristopher Oppenheim M.D.   On: 03/21/2017 10:08  Complexity Note: Imaging results reviewed. Results shared with Ms. Mcgrory, using Layman's terms.                         Meds   Current Outpatient Medications:  .  acetaminophen (TYLENOL) 325 MG tablet, Take by mouth., Disp: , Rfl:  .  alendronate (FOSAMAX) 35 MG tablet, Take 35 mg by mouth every 7 (seven) days. Take with a full glass of water on an empty stomach., Disp: , Rfl:  .  cholecalciferol (VITAMIN D) 400 UNITS TABS tablet, Take 1,000 Units by mouth daily., Disp: , Rfl:  .  cyclobenzaprine (FLEXERIL) 10 MG tablet, Take 1 tablet (10 mg total) by mouth 3 (three) times daily as needed for muscle spasms., Disp: 270 tablet, Rfl: 0 .  DULoxetine (CYMBALTA) 60 MG capsule, Take 60 mg by mouth daily. , Disp: , Rfl:  .  enalapril (VASOTEC) 5 MG tablet, Take 5 mg by mouth daily., Disp: , Rfl:  .  esomeprazole (NEXIUM) 20 MG capsule, Take 20 mg by mouth daily at 12 noon., Disp: , Rfl:  .  fenofibrate 54 MG tablet, Take by mouth., Disp: , Rfl:  .  fluticasone (FLONASE) 50 MCG/ACT nasal spray, 2 sprays nightly., Disp: , Rfl:  .  gabapentin (NEURONTIN) 300 MG capsule, Take 1 capsule (300 mg total) by mouth 2 (two) times daily., Disp: 180 capsule, Rfl: 0 .  Magnesium Oxide, Antacid, 500 MG CAPS, Take by mouth., Disp: , Rfl:  .  oxyCODONE-acetaminophen (PERCOCET) 7.5-325 MG tablet, Take 1 tablet by mouth 5 (five) times daily as needed for severe pain., Disp: 150 tablet, Rfl: 0 .  rosuvastatin (CRESTOR) 5 MG tablet, Take 5 mg by mouth every other day. , Disp: , Rfl:  .  Melatonin 3 MG TABS, Take by mouth., Disp: , Rfl:  .  oxyCODONE-acetaminophen (PERCOCET) 7.5-325 MG tablet, Take 1 tablet by mouth 5 (five) times daily as needed for severe pain., Disp: 150 tablet, Rfl: 0 .  oxyCODONE-acetaminophen (PERCOCET) 7.5-325 MG tablet, Take 1 tablet by mouth 5 (five) times daily as needed for severe pain., Disp: 150 tablet,  Rfl: 0  ROS  Constitutional: Denies any fever or chills Gastrointestinal: No reported hemesis, hematochezia, vomiting, or acute GI distress Musculoskeletal: Denies any acute onset joint swelling, redness, loss of ROM, or weakness Neurological: No reported episodes of acute onset apraxia, aphasia, dysarthria, agnosia, amnesia, paralysis, loss of coordination, or loss of consciousness  Allergies  Ms. Hornak has No Known Allergies.  [place]  Drug: Ms. Babich  reports that she does not use drugs. Alcohol:  reports that she does not drink alcohol. Tobacco:  reports that she has quit smoking. she has never used smokeless tobacco. Medical:  has a past medical history of Absolute anemia (04/11/2015), Angina pectoris ([place]), Anxiety, Arthritis,  Arthropathy of sacroiliac joint (04/11/2015), Asthma, Atypical face pain (04/24/2015), CAD (coronary artery disease), Chronic back pain, Depression, Fibromyalgia, GERD (gastroesophageal reflux disease), 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) (04/11/2015), Hyperlipidemia, Hypertension, Low back pain (04/06/2015), Lumbar radicular pain (04/18/2015), Migraine, Narrowing of intervertebral disc space (04/11/2015), Sacroiliac joint pain (04/11/2015), and Spine disorder. Surgical: Ms. Balster  has a past surgical history that includes Cholecystectomy; Abdominal hysterectomy; Neck surgery (Bilateral); Appendectomy; Neck surgery; Shoulder surgery; Colonoscopy with propofol (N/A, 07/19/2015); and caract surger. Family: family history includes Diabetes in her father and mother; Hypertension in her sister; Stroke in her mother.  Constitutional Exam  General appearance: Well nourished, well developed, and well hydrated. In no apparent acute distress Vitals:   06/13/17 0841 06/13/17 0843  BP:  125/64  Pulse: 84   Resp: 16   Temp: 98 F (36.7 C)   SpO2: 100%   Weight: 149 lb (67.6 kg)   Height: 5' (1.524 m)    BMI Assessment: Estimated body mass index is 29.1 kg/m as  calculated from the following:   Height as of this encounter: 5' (1.524 m).   Weight as of this encounter: 149 lb (67.6 kg).   Psych/Mental status: Alert, oriented x 3 (person, place, & time)       Eyes: PERLA Respiratory: No evidence of acute respiratory distress  Cervical Spine Area Exam  Skin & Axial Inspection: No masses, redness, edema, swelling, or associated skin lesions Alignment: Symmetrical Functional ROM: Unrestricted ROM      Stability: No instability detected Muscle Tone/Strength: Functionally intact. No obvious neuro-muscular anomalies detected. Sensory (Neurological): Unimpaired Palpation: No palpable anomalies              Upper Extremity (UE) Exam    Side: Right upper extremity  Side: Left upper extremity  Skin & Extremity Inspection: Skin color, temperature, and hair growth are WNL. No peripheral edema or cyanosis. No masses, redness, swelling, asymmetry, or associated skin lesions. No contractures.  Skin & Extremity Inspection: Skin color, temperature, and hair growth are WNL. No peripheral edema or cyanosis. No masses, redness, swelling, asymmetry, or associated skin lesions. No contractures.  Functional ROM: Unrestricted ROM          Functional ROM: Unrestricted ROM          Muscle Tone/Strength: Functionally intact. No obvious neuro-muscular anomalies detected.  Muscle Tone/Strength: Functionally intact. No obvious neuro-muscular anomalies detected.  Sensory (Neurological): Unimpaired          Sensory (Neurological): Unimpaired          Palpation: No palpable anomalies              Palpation: No palpable anomalies              Specialized Test(s): Deferred         Specialized Test(s): Deferred          Thoracic Spine Area Exam  Skin & Axial Inspection: No masses, redness, or swelling Alignment: Symmetrical Functional ROM: Unrestricted ROM Stability: No instability detected Muscle Tone/Strength: Functionally intact. No obvious neuro-muscular anomalies  detected. Sensory (Neurological): Unimpaired Muscle strength & Tone: No palpable anomalies  Lumbar Spine Area Exam  Skin & Axial Inspection: No masses, redness, or swelling Alignment: Symmetrical Functional ROM: Unrestricted ROM      Stability: No instability detected Muscle Tone/Strength: Functionally intact. No obvious neuro-muscular anomalies detected. Sensory (Neurological): Unimpaired Palpation: Complains of area being tender to palpation       Provocative Tests: Lumbar Hyperextension and rotation test: evaluation  deferred today       Lumbar Lateral bending test: evaluation deferred today       Patrick's Maneuver: evaluation deferred today                    Gait & Posture Assessment  Ambulation: Unassisted Gait: Relatively normal for age and body habitus Posture: WNL   Lower Extremity Exam    Side: Right lower extremity  Side: Left lower extremity  Skin & Extremity Inspection: Skin color, temperature, and hair growth are WNL. No peripheral edema or cyanosis. No masses, redness, swelling, asymmetry, or associated skin lesions. No contractures.  Skin & Extremity Inspection: Skin color, temperature, and hair growth are WNL. No peripheral edema or cyanosis. No masses, redness, swelling, asymmetry, or associated skin lesions. No contractures.  Functional ROM: Unrestricted ROM          Functional ROM: Unrestricted ROM          Muscle Tone/Strength: Functionally intact. No obvious neuro-muscular anomalies detected.  Muscle Tone/Strength: Functionally intact. No obvious neuro-muscular anomalies detected.  Sensory (Neurological): Unimpaired  Sensory (Neurological): Unimpaired  Palpation: No palpable anomalies  Palpation: No palpable anomalies   Assessment  Primary Diagnosis & Pertinent Problem List: The primary encounter diagnosis was Chronic low back pain (Location of Secondary source of pain) (Bilateral) (midline to tailbone) (R>L). Diagnoses of Chronic sacroiliac joint pain  (Bilateral) (L>R), Chronic hip pain (Bilateral) (L>R), and Chronic pain syndrome were also pertinent to this visit.  Status Diagnosis  Recurring Recurring Recurring 1. Chronic low back pain (Location of Secondary source of pain) (Bilateral) (midline to tailbone) (R>L)   2. Chronic sacroiliac joint pain (Bilateral) (L>R)   3. Chronic hip pain (Bilateral) (L>R)   4. Chronic pain syndrome     Problems updated and reviewed during this visit: No problems updated. Plan of Care  Pharmacotherapy (Medications Ordered): No orders of the defined types were placed in this encounter. This SmartLink is deprecated. Use AVSMEDLIST instead to display the medication list for a patient. Medications administered today: Yoseline L. Scala had no medications administered during this visit. Lab-work, procedure(s), and/or referral(s): No orders of the defined types were placed in this encounter.  Imaging and/or referral(s): None Interventional therapies: Planned, scheduled, and/or pending:  bilateral sacroiliac joint block and bilateral lumbar facet  with sedation   Considering:  Diagnostic caudal epidural steroid injection + diagnostic epidurogram Palliative Right sided lumbar facet radiofrequency ablation + Left S-I Block Diagnostic cervical epidural steroid injection Diagnostic bilateral cervical facet block Possible bilateral cervical facet radiofrequencyablation  Diagnostic bilateral lumbar facet block Possible bilateral lumbar facet radiofrequencyablation  Diagnostic left L4-5 lumbar epidural steroid injection  Diagnostic left S1 selective nerve root block Diagnostic bilateral sacroiliac joint block Possible bilateral sacroiliac joint radiofrequencyablation  Diagnostic right trochanteric bursa injection   Palliative PRN treatment(s):  Palliativecervical epidural steroid injection  Palliativebilateral cervical facet block Palliativebilateral lumbar facet  block Palliativeleft L4-5 lumbar epiduralsteroid injection  Palliativeleft caudal epidural steroid injection  Palliativeleft S1 selective nerve root block Palliativebilateral sacroiliac joint block Palliativeright trochanteric bursa injec   Provider-requested follow-up: Return in about 3 months (around 09/11/2017) for MedMgmt with Me Donella Stade [facility] Pace).  No future appointments. Primary Care Physician: Lavera Guise, MD Location: [facility] Note by: Vevelyn Francois NP Date: 06/13/2017; Time: 9:08 AM  Pain Score Disclaimer: We use the NRS-11 scale. This is a self-reported, subjective measurement of pain severity with only modest accuracy. It is used primarily to identify changes  within a particular patient. It must be understood that outpatient pain scales are significantly less accurate that those used for research, where they can be applied under ideal controlled circumstances with minimal exposure to variables. In reality, the score is likely to be a combination of pain intensity and pain affect, where pain affect describes the degree of emotional arousal or changes in action readiness caused by the sensory experience of pain. Factors such as social and work situation, setting, emotional state, anxiety levels, expectation, and prior pain experience may influence pain perception and show large inter-individual differences that may also be affected by time variables.  Patient instructions provided during this appointment: Patient Instructions   ____________________________________________________________________________________________  Medication Rules  Applies to: All patients receiving prescriptions (written or electronic).  Pharmacy of record: Pharmacy where electronic prescriptions will be sent. If written prescriptions are taken to a different pharmacy, please inform the nursing staff. The pharmacy listed in the electronic medical record should be  the one where you would like electronic prescriptions to be sent.  Prescription refills: Only during scheduled appointments. Applies to both, written and electronic prescriptions.  NOTE: The following applies primarily to controlled substances (Opioid* Pain Medications).   Patient's responsibilities: 1. Pain Pills: Bring all pain pills to every appointment (except for procedure appointments). 2. Pill Bottles: Bring pills in original pharmacy bottle. Always bring newest bottle. Bring bottle, even if empty. 3. Medication refills: You are responsible for knowing and keeping track of what medications you need refilled. The day before your appointment, write a list of all prescriptions that need to be refilled. Bring that list to your appointment and give it to the admitting nurse. Prescriptions will be written only during appointments. If you forget a medication, it will not be "Called in", "Faxed", or "electronically sent". You will need to get another appointment to get these prescribed. 4. Prescription Accuracy: You are responsible for carefully inspecting your prescriptions before leaving our office. Have the discharge nurse carefully go over each prescription with you, before taking them home. Make sure that your name is accurately spelled, that your address is correct. Check the name and dose of your medication to make sure it is accurate. Check the number of pills, and the written instructions to make sure they are clear and accurate. Make sure that you are given enough medication to last until your next medication refill appointment. 5. Taking Medication: Take medication as prescribed. Never take more pills than instructed. Never take medication more frequently than prescribed. Taking less pills or less frequently is permitted and encouraged, when it comes to controlled substances (written prescriptions).  6. Inform other Doctors: Always inform, all of your healthcare providers, of all the  medications you take. 7. Pain Medication from other Providers: You are not allowed to accept any additional pain medication from any other Doctor or Healthcare provider. There are two exceptions to this rule. (see below) In the event that you require additional pain medication, you are responsible for notifying us, as stated below. 8. Medication Agreement: You are responsible for carefully reading and following our Medication Agreement. This must be signed before receiving any prescriptions from our practice. Safely store a copy of your signed Agreement. Violations to the Agreement will result in no further prescriptions. (Additional copies of our Medication Agreement are available upon request.) 9. Laws, Rules, & Regulations: All patients are expected to follow all Federal and [employer], Rules, [employer]. Ignorance of the Laws does not constitute a valid excuse. The  use of any illegal substances is prohibited. 10. Adopted CDC guidelines & recommendations: Target dosing levels will be at or below 60 MME/day. Use of benzodiazepines** is not recommended.  Exceptions: There are only two exceptions to the rule of not receiving pain medications from other Healthcare Providers. 1. Exception #1 (Emergencies): In the event of an emergency (i.e.: accident requiring emergency care), you are allowed to receive additional pain medication. However, you are responsible for: As soon as you are able, call our office [phone], at any time of the day or night, and leave a message stating your name, the date and nature of the emergency, and the name and dose of the medication prescribed. In the event that your call is answered by a member of our staff, make sure to document and save the date, time, and the name of the person that took your information.  2. Exception #2 (Planned Surgery): In the event that you are scheduled by another doctor or dentist to have any type of surgery or procedure, you are  allowed (for a period no longer than 30 days), to receive additional pain medication, for the acute post-op pain. However, in this case, you are responsible for picking up a copy of our "Post-op Pain Management for Surgeons" handout, and giving it to your surgeon or dentist. This document is available at our office, and does not require an appointment to obtain it. Simply go to our office during business hours (Monday-Thursday from 8:00 AM to 4:00 PM) (Friday 8:00 AM to 12:00 Noon) or if you have a scheduled appointment with [place], prior to your surgery, and ask for it by name. In addition, you will need to provide [place] with your name, name of your surgeon, type of surgery, and date of procedure or surgery.  *Opioid medications include: morphine, codeine, oxycodone, oxymorphone, hydrocodone, hydromorphone, meperidine, tramadol, tapentadol, buprenorphine, fentanyl, methadone. **Benzodiazepine medications include: diazepam (Valium), alprazolam (Xanax), clonazepam (Klonopine), lorazepam (Ativan), clorazepate (Tranxene), chlordiazepoxide (Librium), estazolam (Prosom), oxazepam (Serax), temazepam (Restoril), triazolam (Halcion)  ____________________________________________________________________________________________

## 2017-06-13 NOTE — Patient Instructions (Addendum)
____________________________________________________________________________________________  Medication Rules  Applies to: All patients receiving prescriptions (written or electronic).  Pharmacy of record: Pharmacy where electronic prescriptions will be sent. If written prescriptions are taken to a different pharmacy, please inform the nursing staff. The pharmacy listed in the electronic medical record should be the one where you would like electronic prescriptions to be sent.  Prescription refills: Only during scheduled appointments. Applies to both, written and electronic prescriptions.  NOTE: The following applies primarily to controlled substances (Opioid* Pain Medications).   Patient's responsibilities: 1. Pain Pills: Bring all pain pills to every appointment (except for procedure appointments). 2. Pill Bottles: Bring pills in original pharmacy bottle. Always bring newest bottle. Bring bottle, even if empty. 3. Medication refills: You are responsible for knowing and keeping track of what medications you need refilled. The day before your appointment, write a list of all prescriptions that need to be refilled. Bring that list to your appointment and give it to the admitting nurse. Prescriptions will be written only during appointments. If you forget a medication, it will not be "Called in", "Faxed", or "electronically sent". You will need to get another appointment to get these prescribed. 4. Prescription Accuracy: You are responsible for carefully inspecting your prescriptions before leaving our office. Have the discharge nurse carefully go over each prescription with you, before taking them home. Make sure that your name is accurately spelled, that your address is correct. Check the name and dose of your medication to make sure it is accurate. Check the number of pills, and the written instructions to make sure they are clear and accurate. Make sure that you are given enough medication to  last until your next medication refill appointment. 5. Taking Medication: Take medication as prescribed. Never take more pills than instructed. Never take medication more frequently than prescribed. Taking less pills or less frequently is permitted and encouraged, when it comes to controlled substances (written prescriptions).  6. Inform other Doctors: Always inform, all of your healthcare providers, of all the medications you take. 7. Pain Medication from other Providers: You are not allowed to accept any additional pain medication from any other Doctor or Healthcare provider. There are two exceptions to this rule. (see below) In the event that you require additional pain medication, you are responsible for notifying us, as stated below. 8. Medication Agreement: You are responsible for carefully reading and following our Medication Agreement. This must be signed before receiving any prescriptions from our practice. Safely store a copy of your signed Agreement. Violations to the Agreement will result in no further prescriptions. (Additional copies of our Medication Agreement are available upon request.) 9. Laws, Rules, & Regulations: All patients are expected to follow all Federal and State Laws, Statutes, Rules, & Regulations. Ignorance of the Laws does not constitute a valid excuse. The use of any illegal substances is prohibited. 10. Adopted CDC guidelines & recommendations: Target dosing levels will be at or below 60 MME/day. Use of benzodiazepines** is not recommended.  Exceptions: There are only two exceptions to the rule of not receiving pain medications from other Healthcare Providers. 1. Exception #1 (Emergencies): In the event of an emergency (i.e.: accident requiring emergency care), you are allowed to receive additional pain medication. However, you are responsible for: As soon as you are able, call our office [phone], at any time of the day or night, and leave a message stating your  name, the date and nature of the emergency, and the name and dose of the medication   prescribed. In the event that your call is answered by a member of our staff, make sure to document and save the date, time, and the name of the person that took your information.  2. Exception #2 (Planned Surgery): In the event that you are scheduled by another doctor or dentist to have any type of surgery or procedure, you are allowed (for a period no longer than 30 days), to receive additional pain medication, for the acute post-op pain. However, in this case, you are responsible for picking up a copy of our "Post-op Pain Management for Surgeons" handout, and giving it to your surgeon or dentist. This document is available at our office, and does not require an appointment to obtain it. Simply go to our office during business hours (Monday-Thursday from 8:00 AM to 4:00 PM) (Friday 8:00 AM to 12:00 Noon) or if you have a scheduled appointment with [place], prior to your surgery, and ask for it by name. In addition, you will need to provide [place] with your name, name of your surgeon, type of surgery, and date of procedure or surgery.  *Opioid medications include: morphine, codeine, oxycodone, oxymorphone, hydrocodone, hydromorphone, meperidine, tramadol, tapentadol, buprenorphine, fentanyl, methadone. **Benzodiazepine medications include: diazepam (Valium), alprazolam (Xanax), clonazepam (Klonopine), lorazepam (Ativan), clorazepate (Tranxene), chlordiazepoxide (Librium), estazolam (Prosom), oxazepam (Serax), temazepam (Restoril), triazolam (Halcion)  ____________________________________________________________________________________________  GENERAL RISKS AND COMPLICATIONS  What are the risk, side effects and possible complications? Generally speaking, most procedures are safe.  However, with any procedure there are risks, side effects, and the possibility of complications.  The risks and complications are dependent upon the  sites that are lesioned, or the type of nerve block to be performed.  The closer the procedure is to the spine, the more serious the risks are.  Great care is taken when placing the radio frequency needles, block needles or lesioning probes, but sometimes complications can occur. 1. Infection: Any time there is an injection through the skin, there is a risk of infection.  This is why sterile conditions are used for these blocks.  There are four possible types of infection. 1. Localized skin infection. 2. Central Nervous System Infection-This can be in the form of Meningitis, which can be deadly. 3. Epidural Infections-This can be in the form of an epidural abscess, which can cause pressure inside of the spine, causing compression of the spinal cord with subsequent paralysis. This would require an emergency surgery to decompress, and there are no guarantees that the patient would recover from the paralysis. 4. Discitis-This is an infection of the intervertebral discs.  It occurs in about 1% of discography procedures.  It is difficult to treat and it may lead to surgery.        2. Pain: the needles have to go through skin and soft tissues, will cause soreness.       3. Damage to internal structures:  The nerves to be lesioned may be near blood vessels or    other nerves which can be potentially damaged.       4. Bleeding: Bleeding is more common if the patient is taking blood thinners such as  aspirin, Coumadin, Ticiid, Plavix, etc., or if he/she have some genetic predisposition  such as hemophilia. Bleeding into the spinal canal can cause compression of the spinal  cord with subsequent paralysis.  This would require an emergency surgery to  decompress and there are no guarantees that the patient would recover from the  paralysis.  5. Pneumothorax:  Puncturing of a lung is a possibility, every time a needle is introduced in  the area of the chest or upper back.  Pneumothorax refers to free air around  the  collapsed lung(s), inside of the thoracic cavity (chest cavity).  Another two possible  complications related to a similar event would include: Hemothorax and Chylothorax.   These are variations of the Pneumothorax, where instead of air around the collapsed  lung(s), you may have blood or chyle, respectively.       6. Spinal headaches: They may occur with any procedures in the area of the spine.       7. Persistent CSF (Cerebro-Spinal Fluid) leakage: This is a rare problem, but may occur  with prolonged intrathecal or epidural catheters either due to the formation of a fistulous  track or a dural tear.       8. Nerve damage: By working so close to the spinal cord, there is always a possibility of  nerve damage, which could be as serious as a permanent spinal cord injury with  paralysis.       9. Death:  Although rare, severe deadly allergic reactions known as "Anaphylactic  reaction" can occur to any of the medications used.      10. Worsening of the symptoms:  We can always make thing worse.  What are the chances of something like this happening? Chances of any of this occuring are extremely low.  By statistics, you have more of a chance of getting killed in a motor vehicle accident: while driving to the hospital than any of the above occurring .  Nevertheless, you should be aware that they are possibilities.  In general, it is similar to taking a shower.  Everybody knows that you can slip, hit your head and get killed.  Does that mean that you should not shower again?  Nevertheless always keep in mind that statistics do not mean anything if you happen to be on the wrong side of them.  Even if a procedure has a 1 (one) in a 1,000,000 (million) chance of going wrong, it you happen to be that one..Also, keep in mind that by statistics, you have more of a chance of having something go wrong when taking medications.  Who should not have this procedure? If you are on a blood thinning medication (e.g.  Coumadin, Plavix, see list of "Blood Thinners"), or if you have an active infection going on, you should not have the procedure.  If you are taking any blood thinners, please inform your physician.  How should I prepare for this procedure?  Do not eat or drink anything at least six hours prior to the procedure.  Bring a driver with you .  It cannot be a taxi.  Come accompanied by an adult that can drive you back, and that is strong enough to help you if your legs get weak or numb from the local anesthetic.  Take all of your medicines the morning of the procedure with just enough water to swallow them.  If you have diabetes, make sure that you are scheduled to have your procedure done first thing in the morning, whenever possible.  If you have diabetes, take only half of your insulin dose and notify our nurse that you have done so as soon as you arrive at the clinic.  If you are diabetic, but only take blood sugar pills (oral hypoglycemic), then do not take them on the morning of your  procedure.  You may take them after you have had the procedure.  Do not take aspirin or any aspirin-containing medications, at least eleven (11) days prior to the procedure.  They may prolong bleeding.  Wear loose fitting clothing that may be easy to take off and that you would not mind if it got stained with Betadine or blood.  Do not wear any jewelry or perfume  Remove any nail coloring.  It will interfere with some of our monitoring equipment.  NOTE: Remember that this is not meant to be interpreted as a complete list of all possible complications.  Unforeseen problems may occur.  BLOOD THINNERS The following drugs contain aspirin or other products, which can cause increased bleeding during surgery and should not be taken for 2 weeks prior to and 1 week after surgery.  If you should need take something for relief of minor pain, you may take acetaminophen which is found in Tylenol,m Datril, Anacin-3 and  Panadol. It is not blood thinner. The products listed below are.  Do not take any of the products listed below in addition to any listed on your instruction sheet.  A.P.C or A.P.C with Codeine Codeine Phosphate Capsules #3 Ibuprofen Ridaura  ABC compound Congesprin Imuran rimadil  Advil Cope Indocin Robaxisal  Alka-Seltzer Effervescent Pain Reliever and Antacid Coricidin or Coricidin-D  Indomethacin Rufen  Alka-Seltzer plus Cold Medicine Cosprin Ketoprofen S-A-C Tablets  Anacin Analgesic Tablets or Capsules Coumadin Korlgesic Salflex  Anacin Extra Strength Analgesic tablets or capsules CP-2 Tablets Lanoril Salicylate  Anaprox Cuprimine Capsules Levenox Salocol  Anexsia-D Dalteparin Magan Salsalate  Anodynos Darvon compound Magnesium Salicylate Sine-off  Ansaid Dasin Capsules Magsal Sodium Salicylate  Anturane Depen Capsules Marnal Soma  APF Arthritis pain formula Dewitt's Pills Measurin Stanback  Argesic Dia-Gesic Meclofenamic Sulfinpyrazone  Arthritis Bayer Timed Release Aspirin Diclofenac Meclomen Sulindac  Arthritis pain formula Anacin Dicumarol Medipren Supac  Analgesic (Safety coated) Arthralgen Diffunasal Mefanamic Suprofen  Arthritis Strength Bufferin Dihydrocodeine Mepro Compound Suprol  Arthropan liquid Dopirydamole Methcarbomol with Aspirin Synalgos  ASA tablets/Enseals Disalcid Micrainin Tagament  Ascriptin Doan's Midol Talwin  Ascriptin A/D Dolene Mobidin Tanderil  Ascriptin Extra Strength Dolobid Moblgesic Ticlid  Ascriptin with Codeine Doloprin or Doloprin with Codeine Momentum Tolectin  Asperbuf Duoprin Mono-gesic Trendar  Aspergum Duradyne Motrin or Motrin IB Triminicin  Aspirin plain, buffered or enteric coated Durasal Myochrisine Trigesic  Aspirin Suppositories Easprin Nalfon Trillsate  Aspirin with Codeine Ecotrin Regular or Extra Strength Naprosyn Uracel  Atromid-S Efficin Naproxen Ursinus  Auranofin Capsules Elmiron Neocylate Vanquish  Axotal Emagrin Norgesic  Verin  Azathioprine Empirin or Empirin with Codeine Normiflo Vitamin E  Azolid Emprazil Nuprin Voltaren  Bayer Aspirin plain, buffered or children's or timed BC Tablets or powders Encaprin Orgaran Warfarin Sodium  Buff-a-Comp Enoxaparin Orudis Zorpin  Buff-a-Comp with Codeine Equegesic Os-Cal-Gesic   Buffaprin Excedrin plain, buffered or Extra Strength Oxalid   Bufferin Arthritis Strength Feldene Oxphenbutazone   Bufferin plain or Extra Strength Feldene Capsules Oxycodone with Aspirin   Bufferin with Codeine Fenoprofen Fenoprofen Pabalate or Pabalate-SF   Buffets II Flogesic Panagesic   Buffinol plain or Extra Strength Florinal or Florinal with Codeine Panwarfarin   Buf-Tabs Flurbiprofen Penicillamine   Butalbital Compound Four-way cold tablets Penicillin   Butazolidin Fragmin Pepto-Bismol   Carbenicillin Geminisyn Percodan   Carna Arthritis Reliever Geopen Persantine   Carprofen Gold's salt Persistin   Chloramphenicol Goody's Phenylbutazone   Chloromycetin Haltrain Piroxlcam   Clmetidine heparin Plaquenil   Cllnoril Hyco-pap Ponstel   Clofibrate  Hydroxy chloroquine Propoxyphen         Before stopping any of these medications, be sure to consult the physician who ordered them.  Some, such as Coumadin (Warfarin) are ordered to prevent or treat serious conditions such as "deep thrombosis", "pumonary embolisms", and other heart problems.  The amount of time that you may need off of the medication may also vary with the medication and the reason for which you were taking it.  If you are taking any of these medications, please make sure you notify your pain physician before you undergo any procedures.         Sacroiliac (SI) Joint Injection Patient Information  Description: The sacroiliac joint connects the scrum (very low back and tailbone) to the ilium (a pelvic bone which also forms half of the hip joint).  Normally this joint experiences very little motion.  When this joint  becomes inflamed or unstable low back and or hip and pelvis pain may result.  Injection of this joint with local anesthetics (numbing medicines) and steroids can provide diagnostic information and reduce pain.  This injection is performed with the aid of x-ray guidance into the tailbone area while you are lying on your stomach.   You may experience an electrical sensation down the leg while this is being done.  You may also experience numbness.  We also may ask if we are reproducing your normal pain during the injection.  Conditions which may be treated SI injection:   Low back, buttock, hip or leg pain  Preparation for the Injection:  3. Do not eat any solid food or dairy products within 8 hours of your appointment.  4. You may drink clear liquids up to 3 hours before appointment.  Clear liquids include water, black coffee, juice or soda.  No milk or cream please. 5. You may take your regular medications, including pain medications with a sip of water before your appointment.  Diabetics should hold regular insulin (if take separately) and take 1/2 normal NPH dose the morning of the procedure.  Carry some sugar containing items with you to your appointment. 6. A driver must accompany you and be prepared to drive you home after your procedure. 7. Bring all of your current medications with you. 8. An IV may be inserted and sedation may be given at the discretion of the physician. 9. A blood pressure cuff, EKG and other monitors will often be applied during the procedure.  Some patients may need to have extra oxygen administered for a short period.  10. You will be asked to provide medical information, including your allergies, prior to the procedure.  We must know immediately if you are taking blood thinners (like Coumadin/Warfarin) or if you are allergic to IV iodine contrast (dye).  We must know if you could possible be pregnant.  Possible side effects:   Bleeding from needle site  Infection  (rare, may require surgery)  Nerve injury (rare)  Numbness & tingling (temporary)  A brief convulsion or seizure  Light-headedness (temporary)  Pain at injection site (several days)  Decreased blood pressure (temporary)  Weakness in the leg (temporary)   Call if you experience:   New onset weakness or numbness of an extremity below the injection site that last more than 8 hours.  Hives or difficulty breathing ( go to the emergency room)  Inflammation or drainage at the injection site  Any new symptoms which are concerning to you  Please note:  Although the local anesthetic  injected can often make your back/ hip/ buttock/ leg feel good for several hours after the injections, the pain will likely return.  It takes 3-7 days for steroids to work in the sacroiliac area.  You may not notice any pain relief for at least that one week.  If effective, we will often do a series of three injections spaced 3-6 weeks apart to maximally decrease your pain.  After the initial series, we generally will wait some months before a repeat injection of the same type.  If you have any questions, please call [phone] [facility]   Facet Joint Block The facet joints connect the bones of the spine (vertebrae). They make it possible for you to bend, twist, and make other movements with your spine. They also keep you from bending too far, twisting too far, and making other excessive movements. A facet joint block is a procedure where a numbing medicine (anesthetic) is injected into a facet joint. Often, a type of anti-inflammatory medicine called a steroid is also injected. A facet joint block may be done to diagnose neck or back pain. If the pain gets better after a facet joint block, it means the pain is probably coming from the facet joint. If the pain does not get better, it means the pain is probably not coming from the facet joint. A facet joint block may  also be done to relieve neck or back pain caused by an inflamed facet joint. A facet joint block is only done to relieve pain if the pain does not improve with other methods, such as medicine, exercise programs, and physical therapy. Tell a health care provider about:  Any allergies you have.  All medicines you are taking, including vitamins, herbs, eye drops, creams, and over-the-counter medicines.  Any problems you or family members have had with anesthetic medicines.  Any blood disorders you have.  Any surgeries you have had.  Any medical conditions you have.  Whether you are pregnant or may be pregnant. What are the risks? Generally, this is a safe procedure. However, problems may occur, including:  Bleeding.  Injury to a nerve near the injection site.  Pain at the injection site.  Weakness or numbness in areas controlled by nerves near the injection site.  Infection.  Temporary fluid retention.  Allergic reactions to medicines or dyes.  Injury to other structures or organs near the injection site.  What happens before the procedure?  Follow instructions from your health care provider about eating or drinking restrictions.  Ask your health care provider about: ? Changing or stopping your regular medicines. This is especially important if you are taking diabetes medicines or blood thinners. ? Taking medicines such as aspirin and ibuprofen. These medicines can thin your blood. Do not take these medicines before your procedure if your health care provider instructs you not to.  Do not take any new dietary supplements or medicines without asking your health care provider first.  Plan to have someone take you home after the procedure. What happens during the procedure?  You may need to remove your clothing and dress in an open-back gown.  The procedure will be done while you are lying on an X-ray table. You will most likely be asked to lie on your stomach, but you may  be asked to lie in a different position if an injection will be made in your neck.  Machines will be used to monitor your oxygen levels, heart rate, and  blood pressure.  If an injection will be made in your neck, an IV tube will be inserted into one of your veins. Fluids and medicine will flow directly into your body through the IV tube.  The area over the facet joint where the injection will be made will be cleaned with soap. The surrounding skin will be covered with clean drapes.  A numbing medicine (local anesthetic) will be applied to your skin. Your skin may sting or burn for a moment.  A video X-ray machine (fluoroscopy) will be used to locate the joint. In some cases, a CT scan may be used.  A contrast dye may be injected into the facet joint area to help locate the joint.  When the joint is located, an anesthetic will be injected into the joint through the needle.  Your health care provider will ask you whether you feel pain relief. If you do feel relief, a steroid may be injected to provide pain relief for a longer period of time. If you do not feel relief or feel only partial relief, additional injections of an anesthetic may be made in other facet joints.  The needle will be removed.  Your skin will be cleaned.  A bandage (dressing) will be applied over each injection site. The procedure may vary among health care providers and hospitals. What happens after the procedure?  You will be observed for 15-30 minutes before being allowed to go home. This information is not intended to replace advice given to you by your health care provider. Make sure you discuss any questions you have with your health care provider. Document Released: 11/06/2006 Document Revised: 07/19/2015 Document Reviewed: 03/13/2015 [employer]vier Interactive Patient Education  [employer].

## 2017-06-17 ENCOUNTER — Ambulatory Visit (HOSPITAL_BASED_OUTPATIENT_CLINIC_OR_DEPARTMENT_OTHER): Payer: Medicare Other | Admitting: Pain Medicine

## 2017-06-17 ENCOUNTER — Encounter: Payer: Self-pay | Admitting: Pain Medicine

## 2017-06-17 ENCOUNTER — Ambulatory Visit
Admission: RE | Admit: 2017-06-17 | Discharge: 2017-06-17 | Disposition: A | Discharge status: Home / Self Care | Payer: Medicare Other | Source: Ambulatory Visit | Attending: Pain Medicine | Admitting: Pain Medicine

## 2017-06-17 ENCOUNTER — Other Ambulatory Visit: Payer: Self-pay | Admitting: Nurse Practitioner

## 2017-06-17 ENCOUNTER — Telehealth: Payer: Self-pay

## 2017-06-17 ENCOUNTER — Other Ambulatory Visit: Payer: Self-pay

## 2017-06-17 VITALS — BP 117/93 | HR 69 | Temp 98.4°F | Resp 16 | Ht 60.0 in | Wt 149.0 lb

## 2017-06-17 DIAGNOSIS — M47816 Spondylosis without myelopathy or radiculopathy, lumbar region: Secondary | ICD-10-CM

## 2017-06-17 DIAGNOSIS — Z9049 Acquired absence of other specified parts of digestive tract: Secondary | ICD-10-CM | POA: Diagnosis not present

## 2017-06-17 DIAGNOSIS — G8929 Other chronic pain: Secondary | ICD-10-CM | POA: Insufficient documentation

## 2017-06-17 DIAGNOSIS — Z79891 Long term (current) use of opiate analgesic: Secondary | ICD-10-CM | POA: Insufficient documentation

## 2017-06-17 DIAGNOSIS — M545 Low back pain, unspecified: Secondary | ICD-10-CM

## 2017-06-17 DIAGNOSIS — Z79899 Other long term (current) drug therapy: Secondary | ICD-10-CM | POA: Diagnosis not present

## 2017-06-17 DIAGNOSIS — M7918 Myalgia, other site: Secondary | ICD-10-CM

## 2017-06-17 DIAGNOSIS — M533 Sacrococcygeal disorders, not elsewhere classified: Secondary | ICD-10-CM

## 2017-06-17 DIAGNOSIS — F419 Anxiety disorder, unspecified: Secondary | ICD-10-CM | POA: Diagnosis not present

## 2017-06-17 DIAGNOSIS — Z7951 Long term (current) use of inhaled steroids: Secondary | ICD-10-CM | POA: Insufficient documentation

## 2017-06-17 DIAGNOSIS — M488X6 Other specified spondylopathies, lumbar region: Secondary | ICD-10-CM | POA: Insufficient documentation

## 2017-06-17 DIAGNOSIS — Z7983 Long term (current) use of bisphosphonates: Secondary | ICD-10-CM | POA: Insufficient documentation

## 2017-06-17 DIAGNOSIS — Z90710 Acquired absence of both cervix and uterus: Secondary | ICD-10-CM | POA: Diagnosis not present

## 2017-06-17 DIAGNOSIS — M549 Dorsalgia, unspecified: Secondary | ICD-10-CM | POA: Diagnosis present

## 2017-06-17 MED ORDER — CYCLOBENZAPRINE HCL 10 MG PO TABS: 10.0000 mg | ORAL_TABLET | Freq: Three times a day (TID) | ORAL | 0 refills | Status: DC | PRN

## 2017-06-17 MED ORDER — ROPIVACAINE HCL 2 MG/ML IJ SOLN
9.0000 mL | Freq: Once | INTRAMUSCULAR | Status: AC
Start: 2017-06-17 — End: 2017-06-17
  Administered 2017-06-17: 9 mL via INTRA_ARTICULAR
  Filled 2017-06-17: qty 10

## 2017-06-17 MED ORDER — LACTATED RINGERS IV SOLN
1000.0000 mL | Freq: Once | INTRAVENOUS | Status: AC
Start: 2017-06-17 — End: 2017-06-17
  Administered 2017-06-17: 1000 mL via INTRAVENOUS

## 2017-06-17 MED ORDER — MIDAZOLAM HCL 5 MG/5ML IJ SOLN
1.0000 mg | INTRAMUSCULAR | Status: DC | PRN
  Administered 2017-06-17: 4 mg via INTRAVENOUS
  Filled 2017-06-17: qty 5

## 2017-06-17 MED ORDER — TRIAMCINOLONE ACETONIDE 40 MG/ML IJ SUSP
40.0000 mg | Freq: Once | INTRAMUSCULAR | Status: AC
  Administered 2017-06-17: 40 mg
  Filled 2017-06-17: qty 1

## 2017-06-17 MED ORDER — LIDOCAINE HCL 2 % IJ SOLN
10.0000 mL | Freq: Once | INTRAMUSCULAR | Status: AC
  Administered 2017-06-17: 400 mg
  Filled 2017-06-17: qty 40

## 2017-06-17 MED ORDER — ROPIVACAINE HCL 2 MG/ML IJ SOLN
9.0000 mL | Freq: Once | INTRAMUSCULAR | Status: AC
  Administered 2017-06-17: 9 mL via PERINEURAL
  Filled 2017-06-17: qty 10

## 2017-06-17 MED ORDER — METHYLPREDNISOLONE ACETATE 80 MG/ML IJ SUSP
80.0000 mg | Freq: Once | INTRAMUSCULAR | Status: AC
  Administered 2017-06-17: 80 mg via INTRA_ARTICULAR
  Filled 2017-06-17: qty 1

## 2017-06-17 MED ORDER — FENTANYL CITRATE (PF) 100 MCG/2ML IJ SOLN
25.0000 ug | INTRAMUSCULAR | Status: DC | PRN
  Administered 2017-06-17: 100 ug via INTRAVENOUS
  Filled 2017-06-17: qty 2

## 2017-06-17 NOTE — Progress Notes (Signed)
Safety precautions to be maintained throughout the outpatient stay will include: orient to surroundings, keep bed in low position, maintain call bell within reach at all times, provide assistance with transfer out of bed and ambulation.  

## 2017-06-17 NOTE — Progress Notes (Signed)
Patient's Name: Jacqueline Thornton  MRN: [id number]  Referring Provider: Lavera Guise, MD  DOB: 1950/07/14  PCP: Lavera Guise, MD  DOS: 06/17/2017  Note by: Gaspar Cola, MD  Service setting: Ambulatory outpatient  Specialty: Interventional Pain Management  Patient type: Established  Location: [facility] (AMB) Pain Management Facility  Visit type: Interventional Procedure   Primary Reason for Visit: Interventional Pain Management Treatment. CC: Back Pain  Procedure:  Anesthesia, Analgesia, Anxiolysis:  Procedure #1: Type: Diagnostic Medial Branch Facet Block Region: Lumbar Level: L2, L3, L4, L5, & S1 Medial Branch Level(s) Laterality: Bilateral  Procedure #2: Type: Diagnostic Sacroiliac Joint Block Region: Posterior Lumbosacral Level: PSIS (Posterior Superior Iliac Spine) Sacroiliac Joint Laterality: Bilateral  Type: Local Anesthesia with Moderate (Conscious) Sedation Local Anesthetic: Lidocaine 1% Route: Intravenous (IV) IV Access: Secured Sedation: Meaningful verbal contact was maintained at all times during the procedure  Indication(s): Analgesia and Anxiety   Indications: 1. Lumbar facet syndrome (Bilateral) (R>L)   2. Lumbar facet hypertrophy   3. Chronic sacroiliac joint pain (Bilateral) (L>R)   4. Chronic low back pain (Location of Secondary source of pain) (Bilateral) (midline to tailbone) (R>L)   5. Facet syndrome, lumbar   6. Facet hypertrophy of lumbar region   7. Chronic sacroiliac joint pain   8. Chronic bilateral low back pain without sciatica    Pain Score: Pre-procedure: 5 /10 Post-procedure: 0-No pain/10  Pre-op Assessment:  Jacqueline Thornton is a 65 y.o. (year old), female patient, seen today for interventional treatment. She  has a past surgical history that includes Cholecystectomy; Abdominal hysterectomy; Neck surgery (Bilateral); Appendectomy; Neck surgery; Shoulder surgery; Colonoscopy with propofol (N/A, 07/19/2015); and caract surger. Jacqueline Thornton has a  current medication list which includes the following prescription(s): acetaminophen, alendronate, cholecalciferol, duloxetine, enalapril, esomeprazole, fenofibrate, fluticasone, gabapentin, magnesium oxide (antacid), melatonin, oxycodone-acetaminophen, oxycodone-acetaminophen, oxycodone-acetaminophen, rosuvastatin, and cyclobenzaprine, and the following Facility-Administered Medications: fentanyl, lactated ringers, and midazolam. Her primarily concern today is the Back Pain  Initial Vital Signs: There were no vitals taken for this visit. BMI: Estimated body mass index is 29.1 kg/m as calculated from the following:   Height as of this encounter: 5' (1.524 m).   Weight as of this encounter: 149 lb (67.6 kg).  Risk Assessment: Allergies: Reviewed. She has No Known Allergies.  Allergy Precautions: None required Coagulopathies: Reviewed. None identified.  Blood-thinner therapy: None at this time Active Infection(s): Reviewed. None identified. Jacqueline Thornton is afebrile  Site Confirmation: Jacqueline Thornton was asked to confirm the procedure and laterality before marking the site Procedure checklist: Completed Consent: Before the procedure and under the influence of no sedative(s), amnesic(s), or anxiolytics, the patient was informed of the treatment options, risks and possible complications. To fulfill our ethical and legal obligations, as recommended by the American Medical Association's Code of Ethics, I have informed the patient of my clinical impression; the nature and purpose of the treatment or procedure; the risks, benefits, and possible complications of the intervention; the alternatives, including doing nothing; the risk(s) and benefit(s) of the alternative treatment(s) or procedure(s); and the risk(s) and benefit(s) of doing nothing. The patient was provided information about the general risks and possible complications associated with the procedure. These may include, but are not limited to: failure  to achieve desired goals, infection, bleeding, organ or nerve damage, allergic reactions, paralysis, and death. In addition, the patient was informed of those risks and complications associated to Spine-related procedures, such as failure to decrease pain; infection (i.e.: Meningitis, epidural or  intraspinal abscess); bleeding (i.e.: epidural hematoma, subarachnoid hemorrhage, or any other type of intraspinal or peri-dural bleeding); organ or nerve damage (i.e.: Any type of peripheral nerve, nerve root, or spinal cord injury) with subsequent damage to sensory, motor, and/or autonomic systems, resulting in permanent pain, numbness, and/or weakness of one or several areas of the body; allergic reactions; (i.e.: anaphylactic reaction); and/or death. Furthermore, the patient was informed of those risks and complications associated with the medications. These include, but are not limited to: allergic reactions (i.e.: anaphylactic or anaphylactoid reaction(s)); adrenal axis suppression; blood sugar elevation that in diabetics may result in ketoacidosis or comma; water retention that in patients with history of congestive heart failure may result in shortness of breath, pulmonary edema, and decompensation with resultant heart failure; weight gain; swelling or edema; medication-induced neural toxicity; particulate matter embolism and blood vessel occlusion with resultant organ, and/or nervous system infarction; and/or aseptic necrosis of one or more joints. Finally, the patient was informed that Medicine is not an exact science; therefore, there is also the possibility of unforeseen or unpredictable risks and/or possible complications that may result in a catastrophic outcome. The patient indicated having understood very clearly. We have given the patient no guarantees and we have made no promises. Enough time was given to the patient to ask questions, all of which were answered to the patient's satisfaction. Jacqueline Thornton  has indicated that she wanted to continue with the procedure. Attestation: I, the ordering provider, attest that I have discussed with the patient the benefits, risks, side-effects, alternatives, likelihood of achieving goals, and potential problems during recovery for the procedure that I have provided informed consent. Date: 06/17/2017; Time: 7:32 AM  Pre-Procedure Preparation:  Monitoring: As per clinic protocol. Respiration, ETCO2, SpO2, BP, heart rate and rhythm monitor placed and checked for adequate function Safety Precautions: Patient was assessed for positional comfort and pressure points before starting the procedure. Time-out: I initiated and conducted the "Time-out" before starting the procedure, as per protocol. The patient was asked to participate by confirming the accuracy of the "Time Out" information. Verification of the correct person, site, and procedure were performed and confirmed by me, the nursing staff, and the patient. "Time-out" conducted as per Joint Commission's Universal Protocol (UP.01.01.01). "Time-out" Date & Time: 06/17/2017; 1124 hrs.  Description of Procedure #1 Process:   Time-out: "Time-out" completed before starting procedure, as per protocol. Position: Prone Target Area: For Lumbar Facet blocks, the target is the groove formed by the junction of the transverse process and superior articular process. For the L5 dorsal ramus, the target is the notch between superior articular process and sacral ala. For the S1 dorsal ramus, the target is the superior and lateral edge of the posterior S1 Sacral foramen. Approach: Paramedial approach. Area Prepped: Entire Posterior Lumbosacral Region Prepping solution: ChloraPrep (2% chlorhexidine gluconate and 70% isopropyl alcohol) Safety Precautions: Aspiration looking for blood return was conducted prior to all injections. At no point did we inject any substances, as a needle was being advanced. No attempts were made at seeking  any paresthesias. Safe injection practices and needle disposal techniques used. Medications properly checked for expiration dates. SDV (single dose vial) medications used.  Description of the Procedure: Protocol guidelines were followed. The patient was placed in position over the fluoroscopy table. The target area was identified and the area prepped in the usual manner. Skin desensitized using vapocoolant spray. Skin & deeper tissues infiltrated with local anesthetic. Appropriate amount of time allowed to pass for local anesthetics  to take effect. The procedure needle was introduced through the skin, ipsilateral to the reported pain, and advanced to the target area. Employing the "Medial Branch Technique", the needles were advanced to the angle made by the superior and medial portion of the transverse process, and the lateral and inferior portion of the superior articulating process of the targeted vertebral bodies. This area is known as "Burton's Eye" or the "Eye of the [place] Dog". A procedure needle was introduced through the skin, and this time advanced to the angle made by the superior and medial border of the sacral ala, and the lateral border of the S1 vertebral body. This last needle was later repositioned at the superior and lateral border of the posterior S1 foramen. Negative aspiration confirmed. Solution injected in intermittent fashion, asking for systemic symptoms every 0.5cc of injectate. The needles were then removed and the area cleansed, making sure to leave some of the prepping solution back to take advantage of its long term bactericidal properties. Start Time: 1124 hrs. Materials:  Needle(s) Type: Regular needle Gauge: 22G Length: 3.5-in Medication(s): We administered lactated ringers, midazolam, fentaNYL, lidocaine, triamcinolone acetonide, ropivacaine (PF) 2 mg/mL (0.2%), triamcinolone acetonide, ropivacaine (PF) 2 mg/mL (0.2%), ropivacaine (PF) 2 mg/mL (0.2%), and methylPREDNISolone  acetate. Please see chart orders for dosing details.  Description of Procedure # 2 Process:   Position: Prone Target Area: For upper sacroiliac joint block(s), the target is the superior and posterior margin of the sacroiliac joint. Approach: Ipsilateral approach. Area Prepped: Entire Posterior Lumbosacral Region Prepping solution: ChloraPrep (2% chlorhexidine gluconate and 70% isopropyl alcohol) Safety Precautions: Aspiration looking for blood return was conducted prior to all injections. At no point did we inject any substances, as a needle was being advanced. No attempts were made at seeking any paresthesias. Safe injection practices and needle disposal techniques used. Medications properly checked for expiration dates. SDV (single dose vial) medications used. Description of the Procedure: Protocol guidelines were followed. The patient was placed in position over the fluoroscopy table. The target area was identified and the area prepped in the usual manner. Skin desensitized using vapocoolant spray. Skin & deeper tissues infiltrated with local anesthetic. Appropriate amount of time allowed to pass for local anesthetics to take effect. The procedure needle was advanced under fluoroscopic guidance into the sacroiliac joint until a firm endpoint was obtained. Proper needle placement secured. Negative aspiration confirmed. Solution injected in intermittent fashion, asking for systemic symptoms every 0.5cc of injectate. The needles were then removed and the area cleansed, making sure to leave some of the prepping solution back to take advantage of its long term bactericidal properties. Vitals:   06/17/17 1128 06/17/17 1135 06/17/17 1145 06/17/17 1155  BP: 128/77 135/70 (!) 144/84 (!) 117/93  Pulse: 69 82 75 69  Resp: 14 16 15 16   Temp:      TempSrc:      SpO2: 95% 93% 94% 97%  Weight:      Height:        End Time: 1134 hrs. Materials:  Needle(s) Type: Regular needle Gauge: 22G Length:  3.5-in Medication(s): We administered lactated ringers, midazolam, fentaNYL, lidocaine, triamcinolone acetonide, ropivacaine (PF) 2 mg/mL (0.2%), triamcinolone acetonide, ropivacaine (PF) 2 mg/mL (0.2%), ropivacaine (PF) 2 mg/mL (0.2%), and methylPREDNISolone acetate. Please see chart orders for dosing details.  Imaging Guidance (Spinal):  Type of Imaging Technique: Fluoroscopy Guidance (Spinal) Indication(s): Assistance in needle guidance and placement for procedures requiring needle placement in or near specific anatomical locations not easily accessible without  such assistance. Exposure Time: Please see nurses notes. Contrast: None used. Fluoroscopic Guidance: I was personally present during the use of fluoroscopy. "Tunnel Vision Technique" used to obtain the best possible view of the target area. Parallax error corrected before commencing the procedure. "Direction-depth-direction" technique used to introduce the needle under continuous pulsed fluoroscopy. Once target was reached, antero-posterior, oblique, and lateral fluoroscopic projection used confirm needle placement in all planes. Images permanently stored in EMR. Interpretation: No contrast injected. I personally interpreted the imaging intraoperatively. Adequate needle placement confirmed in multiple planes. Permanent images saved into the patient's record.  Antibiotic Prophylaxis:  Indication(s): None identified Antibiotic given: None  Post-operative Assessment:  EBL: None Complications: No immediate post-treatment complications observed by team, or reported by patient. Note: The patient tolerated the entire procedure well. A repeat set of vitals were taken after the procedure and the patient was kept under observation following institutional policy, for this type of procedure. Post-procedural neurological assessment was performed, showing return to baseline, prior to discharge. The patient was provided with post-procedure discharge  instructions, including a section on how to identify potential problems. Should any problems arise concerning this procedure, the patient was given instructions to immediately contact us, at any time, without hesitation. In any case, we plan to contact the patient by telephone for a follow-up status report regarding this interventional procedure. Comments:  No additional relevant information.  Plan of Care    Imaging Orders     DG C-Arm 1-60 Min-No Report  Procedure Orders     LUMBAR FACET(MEDIAL BRANCH NERVE BLOCK) MBNB     SACROILIAC JOINT INJECTION  Medications ordered for procedure: Meds ordered this encounter  Medications  . lactated ringers infusion 1,000 mL  . midazolam (VERSED) 5 MG/5ML injection 1-2 mg    Make sure Flumazenil is available in the pyxis when using this medication. If oversedation occurs, administer 0.2 mg IV over 15 sec. If after 45 sec no response, administer 0.2 mg again over 1 min; may repeat at 1 min intervals; not to exceed 4 doses (1 mg)  . fentaNYL (SUBLIMAZE) injection 25-50 mcg    Make sure Narcan is available in the pyxis when using this medication. In the event of respiratory depression (RR< 8/min): Titrate NARCAN (naloxone) in increments of 0.1 to 0.2 mg IV at 2-3 minute intervals, until desired degree of reversal.  . lidocaine (XYLOCAINE) 2 % (with pres) injection 200 mg  . triamcinolone acetonide (KENALOG-40) injection 40 mg  . ropivacaine (PF) 2 mg/mL (0.2%) (NAROPIN) injection 9 mL  . triamcinolone acetonide (KENALOG-40) injection 40 mg  . ropivacaine (PF) 2 mg/mL (0.2%) (NAROPIN) injection 9 mL  . ropivacaine (PF) 2 mg/mL (0.2%) (NAROPIN) injection 9 mL  . methylPREDNISolone acetate (DEPO-MEDROL) injection 80 mg   Medications administered: We administered lactated ringers, midazolam, fentaNYL, lidocaine, triamcinolone acetonide, ropivacaine (PF) 2 mg/mL (0.2%), triamcinolone acetonide, ropivacaine (PF) 2 mg/mL (0.2%), ropivacaine (PF) 2 mg/mL  (0.2%), and methylPREDNISolone acetate.  See the medical record for exact dosing, route, and time of administration.  This SmartLink is deprecated. Use AVSMEDLIST instead to display the medication list for a patient. Disposition: Discharge home  Discharge Date & Time: 06/17/2017; 1208 hrs.   Physician-requested Follow-up: Return for post-procedure eval (2 wks), w/ Dr. Dossie Arbour. Future Appointments  Date Time Provider [place]  07/09/2017  1:45 PM Milinda Pointer, MD [facility] None  09/09/2017 10:30 AM Vevelyn Francois, NP [facility] None   Primary Care Physician: Lavera Guise, MD Location: [facility] Note by: Gaspar Cola, MD Date: 06/17/2017; Time: 12:15 PM  Disclaimer:  Medicine is not an Chief Strategy Officer. The only guarantee in medicine is that nothing is guaranteed. It is important to note that the decision to proceed with this intervention was based on the information collected from the patient. The Data and conclusions were drawn from the patient's questionnaire, the interview, and the physical examination. Because the information was provided in large part by the patient, it cannot be guaranteed that it has not been purposely or unconsciously manipulated. Every effort has been made to obtain as much relevant data as possible for this evaluation. It is important to note that the conclusions that lead to this procedure are derived in large part from the available data. Always take into account that the treatment will also be dependent on availability of resources and existing treatment guidelines, considered by other Pain Management Practitioners as being common knowledge and practice, at the time of the intervention. For Medico-Legal purposes, it is also important to point out that variation in procedural techniques and pharmacological choices are the acceptable norm. The indications, contraindications, technique, and results of the above procedure  should only be interpreted and judged by a Board-Certified Interventional Pain Specialist with extensive familiarity and expertise in the same exact procedure and technique.

## 2017-06-17 NOTE — Telephone Encounter (Signed)
sent 

## 2017-06-17 NOTE — Telephone Encounter (Signed)
Patient was here on Friday 06-13-17 and did not get flexeril.  Could you wend it in please?

## 2017-06-17 NOTE — Telephone Encounter (Signed)
Patient notified

## 2017-06-17 NOTE — Patient Instructions (Signed)

## 2017-06-18 ENCOUNTER — Telehealth: Payer: Self-pay | Admitting: *Deleted

## 2017-06-18 NOTE — Telephone Encounter (Signed)
Attempted to call for post procedure follow-up. Message left. 

## 2017-07-09 ENCOUNTER — Other Ambulatory Visit: Payer: Self-pay

## 2017-07-09 ENCOUNTER — Ambulatory Visit: Payer: Medicare Other | Attending: Pain Medicine | Admitting: Pain Medicine

## 2017-07-09 ENCOUNTER — Encounter: Payer: Self-pay | Admitting: Pain Medicine

## 2017-07-09 VITALS — BP 122/65 | HR 79 | Temp 98.4°F | Ht 61.0 in | Wt 149.0 lb

## 2017-07-09 DIAGNOSIS — Z8249 Family history of ischemic heart disease and other diseases of the circulatory system: Secondary | ICD-10-CM | POA: Insufficient documentation

## 2017-07-09 DIAGNOSIS — M533 Sacrococcygeal disorders, not elsewhere classified: Secondary | ICD-10-CM | POA: Diagnosis not present

## 2017-07-09 DIAGNOSIS — Z823 Family history of stroke: Secondary | ICD-10-CM | POA: Diagnosis not present

## 2017-07-09 DIAGNOSIS — M47816 Spondylosis without myelopathy or radiculopathy, lumbar region: Secondary | ICD-10-CM | POA: Diagnosis not present

## 2017-07-09 DIAGNOSIS — M545 Low back pain: Secondary | ICD-10-CM | POA: Diagnosis not present

## 2017-07-09 DIAGNOSIS — M47896 Other spondylosis, lumbar region: Secondary | ICD-10-CM | POA: Diagnosis not present

## 2017-07-09 DIAGNOSIS — G894 Chronic pain syndrome: Secondary | ICD-10-CM | POA: Insufficient documentation

## 2017-07-09 DIAGNOSIS — Z9049 Acquired absence of other specified parts of digestive tract: Secondary | ICD-10-CM | POA: Insufficient documentation

## 2017-07-09 DIAGNOSIS — G8929 Other chronic pain: Secondary | ICD-10-CM | POA: Diagnosis not present

## 2017-07-09 DIAGNOSIS — Z79899 Other long term (current) drug therapy: Secondary | ICD-10-CM | POA: Insufficient documentation

## 2017-07-09 DIAGNOSIS — Z833 Family history of diabetes mellitus: Secondary | ICD-10-CM | POA: Insufficient documentation

## 2017-07-09 DIAGNOSIS — Z9889 Other specified postprocedural states: Secondary | ICD-10-CM | POA: Insufficient documentation

## 2017-07-09 DIAGNOSIS — Z90710 Acquired absence of both cervix and uterus: Secondary | ICD-10-CM | POA: Diagnosis not present

## 2017-07-09 DIAGNOSIS — Z79891 Long term (current) use of opiate analgesic: Secondary | ICD-10-CM | POA: Diagnosis not present

## 2017-07-09 DIAGNOSIS — Z87891 Personal history of nicotine dependence: Secondary | ICD-10-CM | POA: Insufficient documentation

## 2017-07-09 NOTE — Progress Notes (Signed)
Nursing Pain Medication Assessment:  Safety precautions to be maintained throughout the outpatient stay will include: orient to surroundings, keep bed in low position, maintain call bell within reach at all times, provide assistance with transfer out of bed and ambulation.  Medication Inspection Compliance: Pill count conducted under aseptic conditions, in front of the patient. Neither the pills nor the bottle was removed from the patient's sight at any time. Once count was completed pills were immediately returned to the patient in their original bottle.  Medication: Oxycodone/APAP Pill/Patch Count: 64 of 150 pills remain Pill/Patch Appearance: Markings consistent with prescribed medication Bottle Appearance: Standard pharmacy container. Clearly labeled. Filled Date: 49 / 24 / 2018 Last Medication intake:  Today

## 2017-07-09 NOTE — Progress Notes (Addendum)
Patient's Name: Jacqueline Thornton  MRN: [id number]  Referring Provider: Lavera Guise, MD  DOB: 11/28/1950  PCP: Lavera Guise, MD  DOS: 07/09/2017  Note by: Gaspar Cola, MD  Service setting: Ambulatory outpatient  Specialty: Interventional Pain Management  Location: [facility] (AMB) Pain Management Facility    Patient type: Established   Primary Reason(s) for Visit: Encounter for post-procedure evaluation of chronic illness with mild to moderate exacerbation CC: No chief complaint on file.  HPI  Jacqueline Thornton is a 67 y.o. year old, female patient, who comes today for a post-procedure evaluation. She has Chronic pain syndrome; Gout; Hyperlipidemia; Headache, migraine; Panic attack; Chronic low back pain (Secondary source of pain) (Bilateral) (midline to tailbone) (R>L); Spondylosis of lumbar spine; Lumbar annular disc tear (L4-5); Discogenic low back pain (L3-4 and L4-5); Lumbar facet hypertrophy; Lumbar facet syndrome (Bilateral) (R>L); Chronic neck pain; Cervical spondylosis; Hx of cervical spine surgery; Cervical spinal fusion (C6-7 interbody fusion); Coronary atherosclerosis; Long term current use of opiate analgesic; Encounter for therapeutic drug level monitoring; Uncomplicated opioid dependence ([place]); Opiate use (56.25 MME/Day); CAD in native artery; Anxiety, generalized; Chronic sacroiliac joint pain (Bilateral) (L>R); Chronic lumbar radicular pain (Primary Source of Pain) (Left) (S1 Dermatome); Long term prescription opiate use; Encounter for chronic pain management; Trochanteric bursitis of right hip; Chronic hip pain (Bilateral) (L>R); Neurogenic pain; Myofascial pain; Osteoarthritis of hip (Left); Arthrodesis status; Degenerative disk disease; Myalgia; Trochanteric bursitis of hip (Bilateral) (L>R); Age-related nuclear cataract of left eye; Posterior subcapsular polar age-related cataract of left eye; Pseudophakia of right eye; and Pseudophakia of left eye on their problem list. Her primarily  concern today is the No chief complaint on file.  Pain Assessment: Location: Lower, Medial Back Radiating: Radiates down hip Onset: More than a month ago Duration: Chronic pain Quality: Shooting, Constant, Nagging, Discomfort Severity: 2 /10 (self-reported pain score)  Note: Reported level is compatible with observation.                         When using our objective Pain Scale, levels between 6 and 10/10 are said to belong in an emergency room, as it progressively worsens from a 6/10, described as severely limiting, requiring emergency care not usually available at an outpatient pain management facility. At a 6/10 level, communication becomes difficult and requires great effort. Assistance to reach the emergency department may be required. Facial flushing and profuse sweating along with potentially dangerous increases in heart rate and blood pressure will be evident. Timing: Constant Modifying factors: ice or heat  Jacqueline Thornton comes in today for post-procedure evaluation after the treatment done on 06/17/2017.  Further details on both, my assessment(s), as well as the proposed treatment plan, please see below.  Post-Procedure Assessment  06/17/2017 Procedure: Diagnostic bilateral lumbar facet block #5 + diagnostic bilateral sacroiliac joint block #3 under fluoroscopic guidance and IV sedation Pre-procedure pain score:  5/10 Post-procedure pain score: 0/10 (100% relief) Influential Factors: BMI: 28.15 kg/m Intra-procedural challenges: None observed.         Assessment challenges: None detected.              Reported side-effects: None.        Post-procedural adverse reactions or complications: None reported         Sedation: Sedation provided. When no sedatives are used, the analgesic levels obtained are directly associated to the effectiveness of the local anesthetics. However, when sedation is provided, the level of analgesia obtained  during the initial 1 hour following the  intervention, is believed to be the result of a combination of factors. These factors may include, but are not limited to: 1. The effectiveness of the local anesthetics used. 2. The effects of the analgesic(s) and/or anxiolytic(s) used. 3. The degree of discomfort experienced by the patient at the time of the procedure. 4. The patients ability and reliability in recalling and recording the events. 5. The presence and influence of possible secondary gains and/or psychosocial factors. Reported result: Relief experienced during the 1st hour after the procedure: 100 % (Ultra-Short Term Relief)            Interpretative annotation: Clinically appropriate result. Analgesia during this period is likely to be Local Anesthetic and/or IV Sedative (Analgesic/Anxiolytic) related.          Effects of local anesthetic: The analgesic effects attained during this period are directly associated to the localized infiltration of local anesthetics and therefore cary significant diagnostic value as to the etiological location, or anatomical origin, of the pain. Expected duration of relief is directly dependent on the pharmacodynamics of the local anesthetic used. Long-acting (4-6 hours) anesthetics used.  Reported result: Relief during the next 4 to 6 hour after the procedure: 90 % (Short-Term Relief)            Interpretative annotation: Clinically appropriate result. Analgesia during this period is likely to be Local Anesthetic-related.          Long-term benefit: Defined as the period of time past the expected duration of local anesthetics (1 hour for short-acting and 4-6 hours for long-acting). With the possible exception of prolonged sympathetic blockade from the local anesthetics, benefits during this period are typically attributed to, or associated with, other factors such as analgesic sensory neuropraxia, antiinflammatory effects, or beneficial biochemical changes provided by agents other than the local  anesthetics.  Reported result: Extended relief following procedure: 80 % (Long-Term Relief)            Interpretative annotation: Clinically appropriate result. Good relief. No permanent benefit expected. Inflammation plays a part in the etiology to the pain.          Current benefits: Defined as reported results that persistent at this point in time.   Analgesia: 80 %            Function: Somewhat improved ROM: Somewhat improved Interpretative annotation: Recurrence of symptoms. No permanent benefit expected. Effective palliative intervention. Benefit could be steroid-related.  Interpretation: Results would suggest a successful diagnostic intervention. The patient has failed to respond to conservative therapies including over-the-counter medications, anti-inflammatories, muscle relaxants, membrane stabilizers, opioids, physical therapy modalities such as heat and ice, as well as more invasive techniques such as nerve blocks. Because Ms. Casseus did attain more than 50% relief of the pain during a series of diagnostic blocks conducted in separate occasions, I believe it is medically necessary to proceed with Radiofrequency Ablation, in order to attempt gaining longer relief. Ms. Galka indicates having had an unsuccessful trial of physical therapy, which she described as non-beneficial and painful.  Plan:  Proceed with Radiofrequency Ablation for the purpose of attaining long-term benefits.        Laboratory Chemistry  Inflammation Markers (CRP: Acute Phase) (ESR: Chronic Phase) Lab Results  Component Value Date   CRP 4.8 12/05/2016   ESRSEDRATE 2 12/05/2016                 Renal Function Markers Lab Results  Component Value Date  BUN 8 12/05/2016   CREATININE 0.88 12/05/2016   GFRAA 80 12/05/2016   GFRNONAA 70 12/05/2016                 Hepatic Function Markers Lab Results  Component Value Date   AST 19 12/05/2016   ALT 13 12/05/2016   ALBUMIN 3.7 12/05/2016   ALKPHOS 48  12/05/2016                 Electrolytes Lab Results  Component Value Date   NA 140 12/05/2016   K 4.0 12/05/2016   CL 103 12/05/2016   CALCIUM 9.3 12/05/2016   MG 2.2 12/05/2016                 Neuropathy Markers Lab Results  Component Value Date   VITAMINB12 202 (L) 12/05/2016                 Bone Pathology Markers Lab Results  Component Value Date   25OHVITD1 50 12/05/2016   25OHVITD2 <1.0 12/05/2016   25OHVITD3 49 12/05/2016                 Coagulation Parameters Lab Results  Component Value Date   INR 1.0 03/02/2014   LABPROT 12.7 03/02/2014   PLT 197 01/26/2012                 Cardiovascular Markers Lab Results  Component Value Date   HGB 14.2 01/26/2012   HCT 41.1 01/26/2012                 Note: Lab results reviewed.  Recent Diagnostic Imaging Results  DG C-Arm 1-60 Min-No Report Fluoroscopy was utilized by the requesting physician.  No radiographic  interpretation.   Complexity Note: Imaging results reviewed. Results shared with Ms. Fuhr, using Layman's terms.                         Meds   Current Outpatient Medications:  .  acetaminophen (TYLENOL) 325 MG tablet, Take by mouth., Disp: , Rfl:  .  alendronate (FOSAMAX) 35 MG tablet, Take 35 mg by mouth every 7 (seven) days. Take with a full glass of water on an empty stomach., Disp: , Rfl:  .  cholecalciferol (VITAMIN D) 400 UNITS TABS tablet, Take 1,000 Units by mouth daily., Disp: , Rfl:  .  cyclobenzaprine (FLEXERIL) 10 MG tablet, Take 1 tablet (10 mg total) by mouth 3 (three) times daily as needed for muscle spasms., Disp: 270 tablet, Rfl: 0 .  DULoxetine (CYMBALTA) 60 MG capsule, Take 60 mg by mouth daily. , Disp: , Rfl:  .  enalapril (VASOTEC) 5 MG tablet, Take 5 mg by mouth daily., Disp: , Rfl:  .  esomeprazole (NEXIUM) 20 MG capsule, Take 20 mg by mouth daily at 12 noon., Disp: , Rfl:  .  fenofibrate 54 MG tablet, Take by mouth., Disp: , Rfl:  .  fluticasone (FLONASE) 50 MCG/ACT nasal  spray, 2 sprays nightly., Disp: , Rfl:  .  Magnesium Oxide, Antacid, 500 MG CAPS, Take by mouth., Disp: , Rfl:  .  Melatonin 3 MG TABS, Take by mouth., Disp: , Rfl:  .  [START ON 08/23/2017] oxyCODONE-acetaminophen (PERCOCET) 7.5-325 MG tablet, Take 1 tablet by mouth 5 (five) times daily as needed for severe pain., Disp: 150 tablet, Rfl: 0 .  [START ON 07/24/2017] oxyCODONE-acetaminophen (PERCOCET) 7.5-325 MG tablet, Take 1 tablet by mouth 5 (five) times daily as needed for   severe pain., Disp: 150 tablet, Rfl: 0 .  oxyCODONE-acetaminophen (PERCOCET) 7.5-325 MG tablet, Take 1 tablet by mouth 5 (five) times daily as needed for severe pain., Disp: 150 tablet, Rfl: 0 .  rosuvastatin (CRESTOR) 5 MG tablet, Take 5 mg by mouth every other day. , Disp: , Rfl:  .  gabapentin (NEURONTIN) 300 MG capsule, Take 1 capsule (300 mg total) by mouth 2 (two) times daily., Disp: 180 capsule, Rfl: 0  ROS  Constitutional: Denies any fever or chills Gastrointestinal: No reported hemesis, hematochezia, vomiting, or acute GI distress Musculoskeletal: Denies any acute onset joint swelling, redness, loss of ROM, or weakness Neurological: No reported episodes of acute onset apraxia, aphasia, dysarthria, agnosia, amnesia, paralysis, loss of coordination, or loss of consciousness  Allergies  Ms. Betzler has No Known Allergies.  PFSH  Drug: Ms. Venus  reports that she does not use drugs. Alcohol:  reports that she does not drink alcohol. Tobacco:  reports that she has quit smoking. she has never used smokeless tobacco. Medical:  has a past medical history of Absolute anemia (04/11/2015), Angina pectoris (HCC), Anxiety, Arthritis, Arthropathy of sacroiliac joint (04/11/2015), Asthma, Atypical face pain (04/24/2015), CAD (coronary artery disease), Chronic back pain, Depression, Fibromyalgia, GERD (gastroesophageal reflux disease), 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) (04/11/2015), Hyperlipidemia, Hypertension, Low back pain  (04/06/2015), Lumbar radicular pain (04/18/2015), Migraine, Narrowing of intervertebral disc space (04/11/2015), Sacroiliac joint pain (04/11/2015), and Spine disorder. Surgical: Ms. Steger  has a past surgical history that includes Cholecystectomy; Abdominal hysterectomy; Neck surgery (Bilateral); Appendectomy; Neck surgery; Shoulder surgery; Colonoscopy with propofol (N/A, 07/19/2015); and caract surger. Family: family history includes Diabetes in her father and mother; Hypertension in her sister; Stroke in her mother.  Constitutional Exam  General appearance: Well nourished, well developed, and well hydrated. In no apparent acute distress Vitals:   07/09/17 1336  BP: 122/65  Pulse: 79  Temp: 98.4 F (36.9 C)  SpO2: 97%  Weight: 149 lb (67.6 kg)  Height: 5' 1" (1.549 m)   BMI Assessment: Estimated body mass index is 28.15 kg/m as calculated from the following:   Height as of this encounter: 5' 1" (1.549 m).   Weight as of this encounter: 149 lb (67.6 kg).  BMI interpretation table: BMI level Category Range association with higher incidence of chronic pain  <18 kg/m2 Underweight   18.5-24.9 kg/m2 Ideal body weight   25-29.9 kg/m2 Overweight Increased incidence by 20%  30-34.9 kg/m2 Obese (Class I) Increased incidence by 68%  35-39.9 kg/m2 Severe obesity (Class II) Increased incidence by 136%  >40 kg/m2 Extreme obesity (Class III) Increased incidence by 254%   BMI Readings from Last 4 Encounters:  07/09/17 28.15 kg/m  06/17/17 29.10 kg/m  06/13/17 29.10 kg/m  03/13/17 28.90 kg/m   Wt Readings from Last 4 Encounters:  07/09/17 149 lb (67.6 kg)  06/17/17 149 lb (67.6 kg)  06/13/17 149 lb (67.6 kg)  03/13/17 148 lb (67.1 kg)  Psych/Mental status: Alert, oriented x 3 (person, place, & time)       Eyes: PERLA Respiratory: No evidence of acute respiratory distress  Cervical Spine Area Exam  Skin & Axial Inspection: No masses, redness, edema, swelling, or associated skin  lesions Alignment: Symmetrical Functional ROM: Unrestricted ROM      Stability: No instability detected Muscle Tone/Strength: Functionally intact. No obvious neuro-muscular anomalies detected. Sensory (Neurological): Unimpaired Palpation: No palpable anomalies              Upper Extremity (UE) Exam      Side: Right upper extremity  Side: Left upper extremity  Skin & Extremity Inspection: Skin color, temperature, and hair growth are WNL. No peripheral edema or cyanosis. No masses, redness, swelling, asymmetry, or associated skin lesions. No contractures.  Skin & Extremity Inspection: Skin color, temperature, and hair growth are WNL. No peripheral edema or cyanosis. No masses, redness, swelling, asymmetry, or associated skin lesions. No contractures.  Functional ROM: Unrestricted ROM          Functional ROM: Unrestricted ROM          Muscle Tone/Strength: Functionally intact. No obvious neuro-muscular anomalies detected.  Muscle Tone/Strength: Functionally intact. No obvious neuro-muscular anomalies detected.  Sensory (Neurological): Unimpaired          Sensory (Neurological): Unimpaired          Palpation: No palpable anomalies              Palpation: No palpable anomalies              Specialized Test(s): Deferred         Specialized Test(s): Deferred          Thoracic Spine Area Exam  Skin & Axial Inspection: No masses, redness, or swelling Alignment: Symmetrical Functional ROM: Unrestricted ROM Stability: No instability detected Muscle Tone/Strength: Functionally intact. No obvious neuro-muscular anomalies detected. Sensory (Neurological): Unimpaired Muscle strength & Tone: No palpable anomalies  Lumbar Spine Area Exam  Skin & Axial Inspection: No masses, redness, or swelling Alignment: Symmetrical Functional ROM: Improved after treatment      Stability: No instability detected Muscle Tone/Strength: Functionally intact. No obvious neuro-muscular anomalies detected. Sensory  (Neurological): Unimpaired Palpation: No palpable anomalies       Provocative Tests: Lumbar Hyperextension and rotation test: Improved after treatment       Lumbar Lateral bending test: evaluation deferred today       Patrick's Maneuver: evaluation deferred today                    Gait & Posture Assessment  Ambulation: Unassisted Gait: Relatively normal for age and body habitus Posture: WNL   Lower Extremity Exam    Side: Right lower extremity  Side: Left lower extremity  Skin & Extremity Inspection: Skin color, temperature, and hair growth are WNL. No peripheral edema or cyanosis. No masses, redness, swelling, asymmetry, or associated skin lesions. No contractures.  Skin & Extremity Inspection: Skin color, temperature, and hair growth are WNL. No peripheral edema or cyanosis. No masses, redness, swelling, asymmetry, or associated skin lesions. No contractures.  Functional ROM: Unrestricted ROM          Functional ROM: Unrestricted ROM          Muscle Tone/Strength: Functionally intact. No obvious neuro-muscular anomalies detected.  Muscle Tone/Strength: Functionally intact. No obvious neuro-muscular anomalies detected.  Sensory (Neurological): Unimpaired  Sensory (Neurological): Unimpaired  Palpation: No palpable anomalies  Palpation: No palpable anomalies   Assessment  Primary Diagnosis & Pertinent Problem List: The primary encounter diagnosis was Chronic low back pain (Secondary source of pain) (Bilateral) (midline to tailbone) (R>L). Diagnoses of Lumbar facet syndrome (Bilateral) (R>L), Chronic sacroiliac joint pain (Bilateral) (L>R), and Lumbar facet hypertrophy were also pertinent to this visit.  Status Diagnosis  Improved Improved Improved 1. Chronic low back pain (Secondary source of pain) (Bilateral) (midline to tailbone) (R>L)   2. Lumbar facet syndrome (Bilateral) (R>L)   3. Chronic sacroiliac joint pain (Bilateral) (L>R)   4. Lumbar facet hypertrophy       Problems updated  and reviewed during this visit: Problem  Chronic lumbar radicular pain (Primary Source of Pain) (Left) (S1 Dermatome)  Chronic low back pain (Secondary source of pain) (Bilateral) (midline to tailbone) (R>L)  Lumbar annular disc tear (L4-5)   Plan of Care  Pharmacotherapy (Medications Ordered): No orders of the defined types were placed in this encounter.  Medications administered today: Saide L. Ferencz had no medications administered during this visit.   Procedure Orders     LUMBAR FACET(MEDIAL BRANCH NERVE BLOCK) MBNB     SACROILIAC JOINT INJECTION     Radiofrequency,Lumbar     Radiofrequency Sacroiliac Joint Lab Orders  No laboratory test(s) ordered today   Imaging Orders  No imaging studies ordered today   Referral Orders  No referral(s) requested today    Interventional management options: Planned, scheduled, and/or pending:   Therapeutic bilateral lumbar facet + sacroiliac joint RFA under fluoroscopic guidance and IV sedation   Considering:   Diagnostic caudal epidural steroid injection + diagnostic epidurogram Palliative Right sided lumbar facet radiofrequency ablation + Left S-I Block Diagnostic cervical epidural steroid injection Diagnostic bilateral cervical facet block Possible bilateral cervical facet radiofrequencyablation  Diagnostic bilateral lumbar facet block Possible bilateral lumbar facet radiofrequencyablation  Diagnostic left L4-5 lumbar epidural steroid injection  Diagnostic left S1 selective nerve root block Diagnostic bilateral sacroiliac joint block Possible bilateral sacroiliac joint radiofrequencyablation  Diagnostic right trochanteric bursa injection   Palliative PRN treatment(s):   Palliativebilateral lumbar facet block+ bilateral sacroiliac joint block # 6  Palliativecervical epidural steroid injection  Palliativebilateral cervical facet block Palliativeleft L4-5 lumbar epiduralsteroid injection  Palliativeleft  caudal epidural steroid injection  Palliativeleft S1 selective nerve root block Palliativebilateral sacroiliac joint block Palliativeright trochanteric bursa injection   Provider-requested follow-up: Return for RFA (fluoro + sedation): (R) L-FCT + (R) SI RFA.  Future Appointments  Date Time Provider Department Center  09/09/2017 10:30 AM King, Crystal M, NP [facility] None   Primary Care Physician: Khan, Fozia M, MD Location: [facility] Note by: Francisco A Naveira, MD Date: 07/09/2017; Time: 2:16 PM 

## 2017-07-09 NOTE — Addendum Note (Signed)
Addended by: Milinda Pointer A on: 07/09/2017 02:13 PM   Modules accepted: Orders

## 2017-07-09 NOTE — Patient Instructions (Addendum)
____________________________________________________________________________________________  Preparing for Procedure with Sedation Instructions: . Oral Intake: Do not eat or drink anything for at least 8 hours prior to your procedure. . Transportation: Public transportation is not allowed. Bring an adult driver. The driver must be physically present in our waiting room before any procedure can be started. . Physical Assistance: Bring an adult physically capable of assisting you, in the event you need help. This adult should keep you company at home for at least 6 hours after the procedure. . Blood Pressure Medicine: Take your blood pressure medicine with a sip of water the morning of the procedure. . Blood thinners:  . Diabetics on insulin: Notify the staff so that you can be scheduled 1st case in the morning. If your diabetes requires high dose insulin, take only  of your normal insulin dose the morning of the procedure and notify the staff that you have done so. . Preventing infections: Shower with an antibacterial soap the morning of your procedure. . Build-up your immune system: Take 1000 mg of Vitamin C with every meal (3 times a day) the day prior to your procedure. . Antibiotics: Inform the staff if you have a condition or reason that requires you to take antibiotics before dental procedures. . Pregnancy: If you are pregnant, call and cancel the procedure. . Sickness: If you have a cold, fever, or any active infections, call and cancel the procedure. . Arrival: You must be in the facility at least 30 minutes prior to your scheduled procedure. . Children: Do not bring children with you. . Dress appropriately: Bring dark clothing that you would not mind if they get stained. . Valuables: Do not bring any jewelry or valuables. Procedure appointments are reserved for interventional treatments only. . No Prescription Refills. . No medication changes will be discussed during procedure  appointments. . No disability issues will be discussed. ____________________________________________________________________________________________  Radiofrequency Lesioning Radiofrequency lesioning is a procedure that is performed to relieve pain. The procedure is often used for back, neck, or arm pain. Radiofrequency lesioning involves the use of a machine that creates radio waves to make heat. During the procedure, the heat is applied to the nerve that carries the pain signal. The heat damages the nerve and interferes with the pain signal. Pain relief usually starts about 2 weeks after the procedure and lasts for 6 months to 1 year. Tell a health care provider about:  Any allergies you have.  All medicines you are taking, including vitamins, herbs, eye drops, creams, and over-the-counter medicines.  Any problems you or family members have had with anesthetic medicines.  Any blood disorders you have.  Any surgeries you have had.  Any medical conditions you have.  Whether you are pregnant or may be pregnant. What are the risks? Generally, this is a safe procedure. However, problems may occur, including:  Pain or soreness at the injection site.  Infection at the injection site.  Damage to nerves or blood vessels.  What happens before the procedure?  Ask your health care provider about: ? Changing or stopping your regular medicines. This is especially important if you are taking diabetes medicines or blood thinners. ? Taking medicines such as aspirin and ibuprofen. These medicines can thin your blood. Do not take these medicines before your procedure if your health care provider instructs you not to.  Follow instructions from your health care provider about eating or drinking restrictions.  Plan to have someone take you home after the procedure.  If you go home   right after the procedure, plan to have someone with you for 24 hours. What happens during the procedure?  You  will be given one or more of the following: ? A medicine to help you relax (sedative). ? A medicine to numb the area (local anesthetic).  You will be awake during the procedure. You will need to be able to talk with the health care provider during the procedure.  With the help of a type of X-ray (fluoroscopy), the health care provider will insert a radiofrequency needle into the area to be treated.  Next, a wire that carries the radio waves (electrode) will be put through the radiofrequency needle. An electrical pulse will be sent through the electrode to verify the correct nerve. You will feel a tingling sensation, and you may have muscle twitching.  Then, the tissue that is around the needle tip will be heated by an electric current that is passed using the radiofrequency machine. This will numb the nerves.  A bandage (dressing) will be put on the insertion area after the procedure is done. The procedure may vary among health care providers and hospitals. What happens after the procedure?  Your blood pressure, heart rate, breathing rate, and blood oxygen level will be monitored often until the medicines you were given have worn off.  Return to your normal activities as directed by your health care provider. This information is not intended to replace advice given to you by your health care provider. Make sure you discuss any questions you have with your health care provider. Document Released: 02/13/2011 Document Revised: 11/23/2015 Document Reviewed: 07/25/2014 Elsevier Interactive Patient Education  2018 [employer]  

## 2017-08-07 ENCOUNTER — Other Ambulatory Visit: Payer: Self-pay

## 2017-08-07 ENCOUNTER — Encounter: Payer: Self-pay | Admitting: Pain Medicine

## 2017-08-07 ENCOUNTER — Ambulatory Visit (HOSPITAL_BASED_OUTPATIENT_CLINIC_OR_DEPARTMENT_OTHER): Payer: Medicare Other | Admitting: Pain Medicine

## 2017-08-07 ENCOUNTER — Ambulatory Visit
Admission: RE | Admit: 2017-08-07 | Discharge: 2017-08-07 | Disposition: A | Discharge status: Home / Self Care | Payer: Medicare Other | Source: Ambulatory Visit | Attending: Pain Medicine | Admitting: Pain Medicine

## 2017-08-07 VITALS — BP 135/55 | HR 80 | Temp 97.9°F | Resp 13 | Ht 60.0 in | Wt 149.0 lb

## 2017-08-07 DIAGNOSIS — M533 Sacrococcygeal disorders, not elsewhere classified: Secondary | ICD-10-CM | POA: Diagnosis not present

## 2017-08-07 DIAGNOSIS — G8929 Other chronic pain: Secondary | ICD-10-CM | POA: Diagnosis not present

## 2017-08-07 DIAGNOSIS — G8918 Other acute postprocedural pain: Secondary | ICD-10-CM

## 2017-08-07 DIAGNOSIS — M47816 Spondylosis without myelopathy or radiculopathy, lumbar region: Secondary | ICD-10-CM | POA: Insufficient documentation

## 2017-08-07 DIAGNOSIS — M545 Low back pain: Secondary | ICD-10-CM

## 2017-08-07 DIAGNOSIS — M47896 Other spondylosis, lumbar region: Secondary | ICD-10-CM | POA: Diagnosis not present

## 2017-08-07 HISTORY — DX: Other acute postprocedural pain: G89.18

## 2017-08-07 MED ORDER — FENTANYL CITRATE (PF) 100 MCG/2ML IJ SOLN
25.0000 ug | INTRAMUSCULAR | Status: DC | PRN
  Administered 2017-08-07: 100 ug via INTRAVENOUS
  Filled 2017-08-07: qty 2

## 2017-08-07 MED ORDER — MIDAZOLAM HCL 5 MG/5ML IJ SOLN
1.0000 mg | INTRAMUSCULAR | Status: DC | PRN
  Administered 2017-08-07: 3 mg via INTRAVENOUS
  Filled 2017-08-07: qty 5

## 2017-08-07 MED ORDER — TRIAMCINOLONE ACETONIDE 40 MG/ML IJ SUSP
40.0000 mg | Freq: Once | INTRAMUSCULAR | Status: AC
  Administered 2017-08-07: 40 mg
  Filled 2017-08-07: qty 1

## 2017-08-07 MED ORDER — ROPIVACAINE HCL 2 MG/ML IJ SOLN
9.0000 mL | Freq: Once | INTRAMUSCULAR | Status: AC
  Administered 2017-08-07: 10 mL via PERINEURAL
  Filled 2017-08-07: qty 10

## 2017-08-07 MED ORDER — HYDROCODONE-ACETAMINOPHEN 5-325 MG PO TABS: 1.0000 | ORAL_TABLET | Freq: Four times a day (QID) | ORAL | 0 refills | Status: AC | PRN

## 2017-08-07 MED ORDER — LIDOCAINE HCL 2 % IJ SOLN
10.0000 mL | Freq: Once | INTRAMUSCULAR | Status: AC
  Administered 2017-08-07: 400 mg
  Filled 2017-08-07: qty 100

## 2017-08-07 MED ORDER — ROPIVACAINE HCL 2 MG/ML IJ SOLN
9.0000 mL | Freq: Once | INTRAMUSCULAR | Status: AC
  Administered 2017-08-07: 9 mL via PERINEURAL
  Filled 2017-08-07: qty 10

## 2017-08-07 MED ORDER — LACTATED RINGERS IV SOLN
1000.0000 mL | Freq: Once | INTRAVENOUS | Status: AC
  Administered 2017-08-07: 1000 mL via INTRAVENOUS

## 2017-08-07 NOTE — Progress Notes (Signed)
Safety precautions to be maintained throughout the outpatient stay will include: orient to surroundings, keep bed in low position, maintain call bell within reach at all times, provide assistance with transfer out of bed and ambulation.  

## 2017-08-07 NOTE — Progress Notes (Signed)
Patient's Name: Jacqueline Thornton  MRN: [id number]  Referring Provider: Lavera Guise, MD  DOB: 04-08-1951  PCP: Lavera Guise, MD  DOS: 08/07/2017  Note by: Gaspar Cola, MD  Service setting: Ambulatory outpatient  Specialty: Interventional Pain Management  Patient type: Established  Location: [facility] (AMB) Pain Management Facility  Visit type: Interventional Procedure   Primary Reason for Visit: Interventional Pain Management Treatment. CC: Back Pain (low)  Procedure:  Anesthesia, Analgesia, Anxiolysis:  Type: Therapeutic Medial Branch Facet & Sacroiliac joint Radiofrequency Ablation Region: Lumbosacral Level: L2, L3, L4, L5, S1, S2, & S3 Medial Branch Level(s) Laterality: Right-Sided  Type: Local Anesthesia with Moderate (Conscious) Sedation Local Anesthetic: Lidocaine 1% Route: Intravenous (IV) IV Access: Secured Sedation: Meaningful verbal contact was maintained at all times during the procedure  Indication(s): Analgesia and Anxiety   Indications: 1. Lumbar facet syndrome (Bilateral) (R>L)   2. Lumbar facet hypertrophy   3. Chronic low back pain (Secondary source of pain) (Bilateral) (midline to tailbone) (R>L)   4. Chronic sacroiliac joint pain (Bilateral) (L>R)   5. Facet syndrome, lumbar   6. Facet hypertrophy of lumbar region   7. Chronic bilateral low back pain without sciatica   8. Chronic sacroiliac joint pain   9. Acute postoperative pain    Jacqueline Thornton has been dealing with the above chronic pain for longer than three months and has either failed to respond, was unable to tolerate, or simply did not get enough benefit from other more conservative therapies including, but not limited to: 1. Over-the-counter medications 2. Anti-inflammatory medications 3. Muscle relaxants 4. Membrane stabilizers 5. Opioids 6. Physical therapy 7. Modalities (Heat, ice, etc.) 8. Invasive techniques such as nerve blocks. Jacqueline Thornton has attained more than 50% relief of the pain from a  series of diagnostic injections conducted in separate occasions.  Pain Score: Pre-procedure: 4 /10 Post-procedure: 0-No pain/10  Pre-op Assessment:  [name] is a 67 y.o. (year old), female patient, seen today for interventional treatment. She  has a past surgical history that includes Cholecystectomy; Abdominal hysterectomy; Neck surgery (Bilateral); Appendectomy; Neck surgery; Shoulder surgery; Colonoscopy with propofol (N/A, 07/19/2015); and caract surger. Jacqueline Thornton has a current medication list which includes the following prescription(s): acetaminophen, alendronate, cholecalciferol, cyclobenzaprine, duloxetine, enalapril, esomeprazole, fenofibrate, fluticasone, gabapentin, magnesium oxide (antacid), melatonin, oxycodone-acetaminophen, oxycodone-acetaminophen, rosuvastatin, hydrocodone-acetaminophen, and oxycodone-acetaminophen, and the following Facility-Administered Medications: fentanyl and midazolam. Her primarily concern today is the Back Pain (low)  Initial Vital Signs:  Pulse Rate: 80 Temp: 98.1 F (36.7 C) Resp: 16 BP: 118/83 SpO2: 99 %  BMI: Estimated body mass index is 29.1 kg/m as calculated from the following:   Height as of this encounter: 5' (1.524 m).   Weight as of this encounter: 149 lb (67.6 kg).  Risk Assessment: Allergies: Reviewed. She has No Known Allergies.  Allergy Precautions: None required Coagulopathies: Reviewed. None identified.  Blood-thinner therapy: None at this time Active Infection(s): Reviewed. None identified. Jacqueline Thornton is afebrile  Site Confirmation: [name] was asked to confirm the procedure and laterality before marking the site Procedure checklist: Completed Consent: Before the procedure and under the influence of no sedative(s), amnesic(s), or anxiolytics, the patient was informed of the treatment options, risks and possible complications. To fulfill our ethical and legal obligations, as recommended by the American Medical  Association's Code of Ethics, I have informed the patient of my clinical impression; the nature and purpose of the treatment or procedure; the risks, benefits, and possible complications of the  intervention; the alternatives, including doing nothing; the risk(s) and benefit(s) of the alternative treatment(s) or procedure(s); and the risk(s) and benefit(s) of doing nothing. The patient was provided information about the general risks and possible complications associated with the procedure. These may include, but are not limited to: failure to achieve desired goals, infection, bleeding, organ or nerve damage, allergic reactions, paralysis, and death. In addition, the patient was informed of those risks and complications associated to Spine-related procedures, such as failure to decrease pain; infection (i.e.: Meningitis, epidural or intraspinal abscess); bleeding (i.e.: epidural hematoma, subarachnoid hemorrhage, or any other type of intraspinal or peri-dural bleeding); organ or nerve damage (i.e.: Any type of peripheral nerve, nerve root, or spinal cord injury) with subsequent damage to sensory, motor, and/or autonomic systems, resulting in permanent pain, numbness, and/or weakness of one or several areas of the body; allergic reactions; (i.e.: anaphylactic reaction); and/or death. Furthermore, the patient was informed of those risks and complications associated with the medications. These include, but are not limited to: allergic reactions (i.e.: anaphylactic or anaphylactoid reaction(s)); adrenal axis suppression; blood sugar elevation that in diabetics may result in ketoacidosis or comma; water retention that in patients with history of congestive heart failure may result in shortness of breath, pulmonary edema, and decompensation with resultant heart failure; weight gain; swelling or edema; medication-induced neural toxicity; particulate matter embolism and blood vessel occlusion with resultant organ, and/or  nervous system infarction; and/or aseptic necrosis of one or more joints. Finally, the patient was informed that Medicine is not an exact science; therefore, there is also the possibility of unforeseen or unpredictable risks and/or possible complications that may result in a catastrophic outcome. The patient indicated having understood very clearly. We have given the patient no guarantees and we have made no promises. Enough time was given to the patient to ask questions, all of which were answered to the patient's satisfaction. Ms. Oneil has indicated that she wanted to continue with the procedure. Attestation: I, the ordering provider, attest that I have discussed with the patient the benefits, risks, side-effects, alternatives, likelihood of achieving goals, and potential problems during recovery for the procedure that I have provided informed consent. Date: 08/07/2017  Time: N/A  Pre-Procedure Preparation:  Monitoring: As per clinic protocol. Respiration, ETCO2, SpO2, BP, heart rate and rhythm monitor placed and checked for adequate function Safety Precautions: Patient was assessed for positional comfort and pressure points before starting the procedure. Time-out: I initiated and conducted the "Time-out" before starting the procedure, as per protocol. The patient was asked to participate by confirming the accuracy of the "Time Out" information. Verification of the correct person, site, and procedure were performed and confirmed by me, the nursing staff, and the patient. "Time-out" conducted as per Joint Commission's Universal Protocol (UP.01.01.01). "Time-out" Date & Time: 08/07/2017; 1134 hrs.  Description of Procedure Process:   Target Area: For Lumbar Facet blocks, the target is the groove formed by the junction of the transverse process and superior articular process. For the L5 dorsal ramus, the target is the notch between superior articular process and sacral ala. For the Sacral dorsal rami, the  target is the superior and lateral edge of the posterior S1, S2, & S3 Sacral foramen. Approach: Paraspinal approach. Area Prepped: Entire Posterior Lumbosacral Region Prepping solution: Hibiclens (4.0% Chlorhexidine gluconate solution) Safety Precautions: Aspiration looking for blood return was conducted prior to all injections. At no point did we inject any substances, as a needle was being advanced. No attempts were made at  seeking any paresthesias. Safe injection practices and needle disposal techniques used. Medications properly checked for expiration dates. SDV (single dose vial) medications used. Description of the Procedure: Protocol guidelines were followed. The patient was placed in position over the procedure table. The target area was identified and the area prepped in the usual manner. The skin and muscle were infiltrated with local anesthetic. Appropriate amount of time allowed to pass for local anesthetics to take effect. Radiofrequency needles were introduced to the target area using fluoroscopic guidance. Using the NeuroTherm NT1100 Radiofrequency Generator, sensory stimulation using 50 Hz was used to locate & identify the nerve, making sure that the needle was positioned such that there was no sensory stimulation below 0.3 V or above 0.7 V. Stimulation using 2 Hz was used to evaluate the motor component. Care was taken not to lesion any nerves that demonstrated motor stimulation of the lower extremities at an output of less than 2.5 times that of the sensory threshold, or a maximum of 2.0 V. Once satisfactory placement of the needles was achieved, the numbing solution was slowly injected after negative aspiration. After waiting for at least 2 minutes, the ablation was performed at 80 degrees C for 60 seconds, using regular Radiofrequency settings. Once the procedure was completed, the needles were then removed and the area cleansed, making sure to leave some of the prepping solution back to  take advantage of its long term bactericidal properties. Intra-operative Compliance: Compliant Vitals:   08/07/17 1222 08/07/17 1228 08/07/17 1233 08/07/17 1243  BP:  137/61 128/62 (!) 135/55  Pulse:      Resp: 14 17 15 13   Temp: 97.9 F (36.6 C)     TempSrc: Temporal     SpO2: 96% 99% 98% 98%  Weight:      Height:        Start Time: 1135 hrs. End Time: 1212 hrs. Materials & Medications:  Needle(s) Type: Teflon-coated, curved tip, Radiofrequency needle(s) Gauge: 22G Length: 10cm Medication(s): Please see orders for medications and dosing details.  Imaging Guidance (Spinal):  Type of Imaging Technique: Fluoroscopy Guidance (Spinal) Indication(s): Assistance in needle guidance and placement for procedures requiring needle placement in or near specific anatomical locations not easily accessible without such assistance. Exposure Time: Please see nurses notes. Contrast: None used. Fluoroscopic Guidance: I was personally present during the use of fluoroscopy. "Tunnel Vision Technique" used to obtain the best possible view of the target area. Parallax error corrected before commencing the procedure. "Direction-depth-direction" technique used to introduce the needle under continuous pulsed fluoroscopy. Once target was reached, antero-posterior, oblique, and lateral fluoroscopic projection used confirm needle placement in all planes. Images permanently stored in EMR. Interpretation: No contrast injected. I personally interpreted the imaging intraoperatively. Adequate needle placement confirmed in multiple planes. Permanent images saved into the patient's record.  Antibiotic Prophylaxis:   Anti-infectives (From admission, onward)   None     Indication(s): None identified  Post-operative Assessment:  Post-procedure Vital Signs:  Pulse Rate: 80 Temp: 97.9 F (36.6 C) Resp: 13 BP: (!) 135/55 SpO2: 98 %  EBL: None  Complications: No immediate post-treatment complications observed  by team, or reported by patient.  Note: The patient tolerated the entire procedure well. A repeat set of vitals were taken after the procedure and the patient was kept under observation following institutional policy, for this type of procedure. Post-procedural neurological assessment was performed, showing return to baseline, prior to discharge. The patient was provided with post-procedure discharge instructions, including a section on how  to identify potential problems. Should any problems arise concerning this procedure, the patient was given instructions to immediately contact us, at any time, without hesitation. In any case, we plan to contact the patient by telephone for a follow-up status report regarding this interventional procedure.  Comments:  No additional relevant information.  Plan of Care   Imaging Orders     DG C-Arm 1-60 Min-No Report  Procedure Orders     Radiofrequency,Lumbar     Radiofrequency Sacroiliac Joint  Medications ordered for procedure: Meds ordered this encounter  Medications  . midazolam (VERSED) 5 MG/5ML injection 1-2 mg    Make sure Flumazenil is available in the pyxis when using this medication. If oversedation occurs, administer 0.2 mg IV over 15 sec. If after 45 sec no response, administer 0.2 mg again over 1 min; may repeat at 1 min intervals; not to exceed 4 doses (1 mg)  . fentaNYL (SUBLIMAZE) injection 25-50 mcg    Make sure Narcan is available in the pyxis when using this medication. In the event of respiratory depression (RR< 8/min): Titrate NARCAN (naloxone) in increments of 0.1 to 0.2 mg IV at 2-3 minute intervals, until desired degree of reversal.  . lactated ringers infusion 1,000 mL  . lidocaine (XYLOCAINE) 2 % (with pres) injection 200 mg  . ropivacaine (PF) 2 mg/mL (0.2%) (NAROPIN) injection 9 mL  . triamcinolone acetonide (KENALOG-40) injection 40 mg  . ropivacaine (PF) 2 mg/mL (0.2%) (NAROPIN) injection 9 mL  . triamcinolone acetonide  (KENALOG-40) injection 40 mg  . HYDROcodone-acetaminophen (NORCO/VICODIN) 5-325 MG tablet    Sig: Take 1 tablet by mouth every 6 (six) hours as needed for up to 7 days for moderate pain.    Dispense:  28 tablet    Refill:  0    For acute post-operative pain. Not to be refilled. To last 7 days.   Medications administered: We administered midazolam, fentaNYL, lactated ringers, lidocaine, ropivacaine (PF) 2 mg/mL (0.2%), triamcinolone acetonide, ropivacaine (PF) 2 mg/mL (0.2%), and triamcinolone acetonide.  See the medical record for exact dosing, route, and time of administration.  New Prescriptions   HYDROCODONE-ACETAMINOPHEN (NORCO/VICODIN) 5-325 MG TABLET    Take 1 tablet by mouth every 6 (six) hours as needed for up to 7 days for moderate pain.   Disposition: Discharge home  Discharge Date & Time: 08/07/2017; 1256 hrs.   Physician-requested Follow-up: Return for contralateral RFA (2 wks): (L) L-FCT + (L) SI RFA.  Future Appointments  Date Time Provider [place]  09/09/2017 10:30 AM Vevelyn Francois, NP [facility] None  09/16/2017  2:15 PM Milinda Pointer, MD [facility] None   Primary Care Physician: Lavera Guise, MD Location: [facility] Note by: Gaspar Cola, MD Date: 08/07/2017; Time: 1:26 PM  Disclaimer:  Medicine is not an Chief Strategy Officer. The only guarantee in medicine is that nothing is guaranteed. It is important to note that the decision to proceed with this intervention was based on the information collected from the patient. The Data and conclusions were drawn from the patient's questionnaire, the interview, and the physical examination. Because the information was provided in large part by the patient, it cannot be guaranteed that it has not been purposely or unconsciously manipulated. Every effort has been made to obtain as much relevant data as possible for this evaluation. It is important to note that the conclusions that lead to  this procedure are derived in large part from the available data. Always take into account that  the treatment will also be dependent on availability of resources and existing treatment guidelines, considered by other Pain Management Practitioners as being common knowledge and practice, at the time of the intervention. For Medico-Legal purposes, it is also important to point out that variation in procedural techniques and pharmacological choices are the acceptable norm. The indications, contraindications, technique, and results of the above procedure should only be interpreted and judged by a Board-Certified Interventional Pain Specialist with extensive familiarity and expertise in the same exact procedure and technique.

## 2017-08-07 NOTE — Patient Instructions (Signed)
____________________________________________________________________________________________  Post-Procedure instructions Instructions:  Apply ice: Fill a plastic sandwich bag with crushed ice. Cover it with a small towel and apply to injection site. Apply for 15 minutes then remove x 15 minutes. Repeat sequence on day of procedure, until you go to bed. The purpose is to minimize swelling and discomfort after procedure.  Apply heat: Apply heat to procedure site starting the day following the procedure. The purpose is to treat any soreness and discomfort from the procedure.  Food intake: Start with clear liquids (like water) and advance to regular food, as tolerated.   Physical activities: Keep activities to a minimum for the first 8 hours after the procedure.   Driving: If you have received any sedation, you are not allowed to drive for 24 hours after your procedure.  Blood thinner: Restart your blood thinner 6 hours after your procedure. (Only for those taking blood thinners)  Insulin: As soon as you can eat, you may resume your normal dosing schedule. (Only for those taking insulin)  Infection prevention: Keep procedure site clean and dry.  Post-procedure Pain Diary: Extremely important that this be done correctly and accurately. Recorded information will be used to determine the next step in treatment.  Pain evaluated is that of treated area only. Do not include pain from an untreated area.  Complete every hour, on the hour, for the initial 8 hours. Set an alarm to help you do this part accurately.  Do not go to sleep and have it completed later. It will not be accurate.  Follow-up appointment: Keep your follow-up appointment after the procedure. Usually 2 weeks for most procedures. (6 weeks in the case of radiofrequency.) Bring you pain diary.  Expect:  From numbing medicine (AKA: Local Anesthetics): Numbness or decrease in pain.  Onset: Full effect within 15 minutes of  injected.  Duration: It will depend on the type of local anesthetic used. On the average, 1 to 8 hours.   From steroids: Decrease in swelling or inflammation. Once inflammation is improved, relief of the pain will follow.  Onset of benefits: Depends on the amount of swelling present. The more swelling, the longer it will take for the benefits to be seen. In some cases, up to 10 days.  Duration: Steroids will stay in the system x 2 weeks. Duration of benefits will depend on multiple posibilities including persistent irritating factors.  From procedure: Some discomfort is to be expected once the numbing medicine wears off. This should be minimal if ice and heat are applied as instructed. Call if:  You experience numbness and weakness that gets worse with time, as opposed to wearing off.  New onset bowel or bladder incontinence. (Spinal procedures only)  Emergency Numbers:  Durning business hours (Monday - Thursday, 8:00 AM - 4:00 PM) (Friday, 9:00 AM - 12:00 Noon): [phone]  After hours: [phone] ____________________________________________________________________________________________  ____________________________________________________________________________________________  Preparing for Procedure with Sedation Instructions: . Oral Intake: Do not eat or drink anything for at least 8 hours prior to your procedure. . Transportation: Public transportation is not allowed. Bring an adult driver. The driver must be physically present in our waiting room before any procedure can be started. . Physical Assistance: Bring an adult physically capable of assisting you, in the event you need help. This adult should keep you company at home for at least 6 hours after the procedure. . Blood Pressure Medicine: Take your blood pressure medicine with a sip of water the morning of the procedure. . Blood   thinners:  . Diabetics on insulin: Notify the staff so that you can be scheduled  1st case in the morning. If your diabetes requires high dose insulin, take only  of your normal insulin dose the morning of the procedure and notify the staff that you have done so. . Preventing infections: Shower with an antibacterial soap the morning of your procedure. . Build-up your immune system: Take 1000 mg of Vitamin C with every meal (3 times a day) the day prior to your procedure. . Antibiotics: Inform the staff if you have a condition or reason that requires you to take antibiotics before dental procedures. . Pregnancy: If you are pregnant, call and cancel the procedure. . Sickness: If you have a cold, fever, or any active infections, call and cancel the procedure. . Arrival: You must be in the facility at least 30 minutes prior to your scheduled procedure. . Children: Do not bring children with you. . Dress appropriately: Bring dark clothing that you would not mind if they get stained. . Valuables: Do not bring any jewelry or valuables. Procedure appointments are reserved for interventional treatments only. . No Prescription Refills. . No medication changes will be discussed during procedure appointments. . No disability issues will be discussed. ____________________________________________________________________________________________   

## 2017-08-08 ENCOUNTER — Telehealth: Payer: Self-pay

## 2017-08-08 NOTE — Telephone Encounter (Signed)
Ost procedure phone call.  Patient states she is doing good.  

## 2017-09-09 ENCOUNTER — Other Ambulatory Visit: Payer: Self-pay

## 2017-09-09 ENCOUNTER — Ambulatory Visit: Payer: Medicare Other | Attending: Nurse Practitioner | Admitting: Nurse Practitioner

## 2017-09-09 ENCOUNTER — Encounter: Payer: Self-pay | Admitting: Nurse Practitioner

## 2017-09-09 VITALS — BP 124/73 | HR 79 | Temp 98.0°F | Resp 16 | Ht 60.5 in | Wt 148.0 lb

## 2017-09-09 DIAGNOSIS — Z9049 Acquired absence of other specified parts of digestive tract: Secondary | ICD-10-CM | POA: Insufficient documentation

## 2017-09-09 DIAGNOSIS — Z79899 Other long term (current) drug therapy: Secondary | ICD-10-CM | POA: Insufficient documentation

## 2017-09-09 DIAGNOSIS — M533 Sacrococcygeal disorders, not elsewhere classified: Secondary | ICD-10-CM | POA: Insufficient documentation

## 2017-09-09 DIAGNOSIS — Z87891 Personal history of nicotine dependence: Secondary | ICD-10-CM | POA: Insufficient documentation

## 2017-09-09 DIAGNOSIS — Z823 Family history of stroke: Secondary | ICD-10-CM | POA: Diagnosis not present

## 2017-09-09 DIAGNOSIS — Z8249 Family history of ischemic heart disease and other diseases of the circulatory system: Secondary | ICD-10-CM | POA: Diagnosis not present

## 2017-09-09 DIAGNOSIS — M7918 Myalgia, other site: Secondary | ICD-10-CM | POA: Diagnosis not present

## 2017-09-09 DIAGNOSIS — E785 Hyperlipidemia, unspecified: Secondary | ICD-10-CM | POA: Insufficient documentation

## 2017-09-09 DIAGNOSIS — Z90710 Acquired absence of both cervix and uterus: Secondary | ICD-10-CM | POA: Insufficient documentation

## 2017-09-09 DIAGNOSIS — H25812 Combined forms of age-related cataract, left eye: Secondary | ICD-10-CM | POA: Diagnosis not present

## 2017-09-09 DIAGNOSIS — M47816 Spondylosis without myelopathy or radiculopathy, lumbar region: Secondary | ICD-10-CM

## 2017-09-09 DIAGNOSIS — Z981 Arthrodesis status: Secondary | ICD-10-CM | POA: Diagnosis not present

## 2017-09-09 DIAGNOSIS — Z833 Family history of diabetes mellitus: Secondary | ICD-10-CM | POA: Insufficient documentation

## 2017-09-09 DIAGNOSIS — I2510 Atherosclerotic heart disease of native coronary artery without angina pectoris: Secondary | ICD-10-CM | POA: Diagnosis not present

## 2017-09-09 DIAGNOSIS — G8918 Other acute postprocedural pain: Secondary | ICD-10-CM | POA: Diagnosis not present

## 2017-09-09 DIAGNOSIS — Z961 Presence of intraocular lens: Secondary | ICD-10-CM | POA: Diagnosis not present

## 2017-09-09 DIAGNOSIS — Z79891 Long term (current) use of opiate analgesic: Secondary | ICD-10-CM | POA: Diagnosis not present

## 2017-09-09 DIAGNOSIS — M5136 Other intervertebral disc degeneration, lumbar region: Secondary | ICD-10-CM

## 2017-09-09 DIAGNOSIS — Z9889 Other specified postprocedural states: Secondary | ICD-10-CM | POA: Diagnosis not present

## 2017-09-09 DIAGNOSIS — M792 Neuralgia and neuritis, unspecified: Secondary | ICD-10-CM | POA: Diagnosis not present

## 2017-09-09 DIAGNOSIS — M1610 Unilateral primary osteoarthritis, unspecified hip: Secondary | ICD-10-CM | POA: Insufficient documentation

## 2017-09-09 DIAGNOSIS — G894 Chronic pain syndrome: Secondary | ICD-10-CM

## 2017-09-09 DIAGNOSIS — G43909 Migraine, unspecified, not intractable, without status migrainosus: Secondary | ICD-10-CM | POA: Diagnosis not present

## 2017-09-09 MED ORDER — OXYCODONE-ACETAMINOPHEN 7.5-325 MG PO TABS: 1.0000 | ORAL_TABLET | Freq: Every day | ORAL | 0 refills | Status: DC | PRN

## 2017-09-09 MED ORDER — GABAPENTIN 300 MG PO CAPS: 300.0000 mg | ORAL_CAPSULE | Freq: Two times a day (BID) | ORAL | 0 refills | Status: DC

## 2017-09-09 MED ORDER — CYCLOBENZAPRINE HCL 10 MG PO TABS: 10.0000 mg | ORAL_TABLET | Freq: Three times a day (TID) | ORAL | 0 refills | Status: DC | PRN

## 2017-09-09 NOTE — Progress Notes (Signed)
Patient's Name: Jacqueline Thornton  MRN: [id number]  Referring Provider: Lavera Guise, MD  DOB: [birth year]  PCP: Lavera Guise, MD  DOS: 09/09/2017  Note by: Vevelyn Francois NP  Service setting: Ambulatory outpatient  Specialty: Interventional Pain Management  Location: [facility] (AMB) Pain Management Facility    Patient type: Established    Primary Reason(s) for Visit: Encounter for prescription drug management & post-procedure evaluation of chronic illness with mild to moderate exacerbation(Level of risk: moderate) CC: Back Pain (lower)  HPI  Jacqueline Thornton is a 67 y.o. year old, female patient, who comes today for a post-procedure evaluation and medication management. She has Chronic pain syndrome; Gout; Hyperlipidemia; Headache, migraine; Panic attack; Chronic low back pain (Secondary source of pain) (Bilateral) (midline to tailbone) (R>L); Spondylosis of lumbar spine; Lumbar annular disc tear (L4-5); Discogenic low back pain (L3-4 and L4-5); Lumbar facet hypertrophy; Lumbar facet syndrome (Bilateral) (R>L); Chronic neck pain; Cervical spondylosis; Hx of cervical spine surgery; Cervical spinal fusion (C6-7 interbody fusion); Coronary atherosclerosis; Long term current use of opiate analgesic; Encounter for therapeutic drug level monitoring; Uncomplicated opioid dependence ([place]); Opiate use (56.25 MME/Day); CAD in native artery; Anxiety, generalized; Chronic sacroiliac joint pain (Bilateral) (L>R); Chronic lumbar radicular pain (Primary Source of Pain) (Left) (S1 Dermatome); Long term prescription opiate use; Encounter for chronic pain management; Trochanteric bursitis of right hip; Chronic hip pain (Bilateral) (L>R); Neurogenic pain; Myofascial pain; Osteoarthritis of hip (Left); Arthrodesis status; Degenerative disk disease; Myalgia; Trochanteric bursitis of hip (Bilateral) (L>R); Age-related nuclear cataract of left eye; Posterior subcapsular polar age-related cataract of left eye; Pseudophakia of right eye;  Pseudophakia of left eye; and Acute postoperative pain on their problem list. Her primarily concern today is the Back Pain (lower)  Pain Assessment: Location: Lower Back Radiating: hip/buttock down left leg to foot/great toe Onset: More than a month ago Duration: Chronic pain Quality: Aching, Discomfort, Burning, Nagging Severity: 4 /10 (self-reported pain score)  Note: Reported level is compatible with observation.                         When using our objective Pain Scale, levels between 6 and 10/10 are said to belong in an emergency room, as it progressively worsens from a 6/10, described as severely limiting, requiring emergency care not usually available at an outpatient pain management facility. At a 6/10 level, communication becomes difficult and requires great effort. Assistance to reach the emergency department may be required. Facial flushing and profuse sweating along with potentially dangerous increases in heart rate and blood pressure will be evident. Effect on ADL: standing, prolonged walking Timing: Constant Modifying factors: medications, heat, ice  Jacqueline Thornton was last seen on 08/07/2017 for a procedure. During today's appointment we reviewed Jacqueline Thornton's post-procedure results, as well as her outpatient medication regimen. She admits that she had done well with her right lumbar RFA. She is wanting until March 19 for her left lumbar RFA.  She admits that the pain goes off to the side of her leg. She denies any numbness or tingling in the legs. She states that she is not able to sit or stand for long periods.  Further details on both, my assessment(s), as well as the proposed treatment plan, please see below.  Controlled Substance Pharmacotherapy Assessment REMS (Risk Evaluation and Mitigation Strategy)  Analgesic:Oxycodone 7.5/325 one tablet 5 times a day. (56.25 mg/day) MME/day:56.25 mg/day    Ignatius Specking, RN  09/09/2017 11:07 AM  Sign at close encounter Nursing  Pain Medication Assessment:  Safety precautions to be maintained throughout the outpatient stay will include: orient to surroundings, keep bed in low position, maintain call bell within reach at all times, provide assistance with transfer out of bed and ambulation.  Medication Inspection Compliance: Pill count conducted under aseptic conditions, in front of the patient. Neither the pills nor the bottle was removed from the patient's sight at any time. Once count was completed pills were immediately returned to the patient in their original bottle.  Medication: See above Pill/Patch Count: 59 of 150 pills remain Pill/Patch Appearance: Markings consistent with prescribed medication Bottle Appearance: Standard pharmacy container. Clearly labeled. Filled Date: 2 / 23 / 2019 Last Medication intake:  Today   Pharmacokinetics: Liberation and absorption (onset of action): WNL Distribution (time to peak effect): WNL Metabolism and excretion (duration of action): WNL         Pharmacodynamics: Desired effects: Analgesia: Jacqueline Thornton reports >50% benefit. Functional ability: Patient reports that medication allows her to accomplish basic ADLs Clinically meaningful improvement in function (CMIF): Sustained CMIF goals met Perceived effectiveness: Described as relatively effective, allowing for increase in activities of daily living (ADL) Undesirable effects: Side-effects or Adverse reactions: None reported Monitoring: [place] PMP: Online review of the past 39-monthperiod conducted. Compliant with practice rules and regulations Last UDS on record: Summary  Date Value Ref Range Status  03/11/2017 FINAL  Final    Comment:    ==================================================================== TOXASSURE SELECT 13 (MW) ==================================================================== Test                             Result       Flag       Units Drug Present and Declared for Prescription Verification    Oxycodone                      3245         EXPECTED   ng/mg creat   Oxymorphone                    1353         EXPECTED   ng/mg creat   Noroxycodone                   2883         EXPECTED   ng/mg creat   Noroxymorphone                 185          EXPECTED   ng/mg creat    Sources of oxycodone are scheduled prescription medications.    Oxymorphone, noroxycodone, and noroxymorphone are expected    metabolites of oxycodone. Oxymorphone is also available as a    scheduled prescription medication. ==================================================================== Test                      Result    Flag   Units      Ref Range   Creatinine              47               mg/dL      >=20 ==================================================================== Declared Medications:  The flagging and interpretation on this report are based on the  following declared medications.  Unexpected results may arise from  inaccuracies in the declared medications.  **Note:  The testing scope of this panel includes these medications:  Oxycodone (Percocet)  **Note: The testing scope of this panel does not include following  reported medications:  Acetaminophen (Percocet)  Acetaminophen (Tylenol)  Alendronate (Fosamax)  Bromfenac  Cyclobenzaprine (Flexeril)  Duloxetine (Cymbalta)  Enalapril (Vasotec)  Estradiol (Estrace)  Fenofibrate  Gabapentin (Neurontin)  Magnesium Oxide  Ofloxaxin  Omeprazole (Nexium)  Rosuvastatin (Crestor)  Topical  Vitamin D ==================================================================== For clinical consultation, please call [phone]. ====================================================================    UDS interpretation: Compliant          Medication Assessment Form: Reviewed. Patient indicates being compliant with therapy Treatment compliance: Compliant Risk Assessment Profile: Aberrant behavior: See prior evaluations. None observed or detected  today Comorbid factors increasing risk of overdose: See prior notes. No additional risks detected today Risk of substance use disorder (SUD): Low Opioid Risk Tool - 09/09/17 1104      Family History of Substance Abuse   Alcohol  Negative    Illegal Drugs  Negative    Rx Drugs  Negative      Personal History of Substance Abuse   Alcohol  Negative    Illegal Drugs  Negative    Rx Drugs  Negative      Age   Age between 21-45 years   No      History of Preadolescent Sexual Abuse   History of Preadolescent Sexual Abuse  Negative or Female      Psychological Disease   Psychological Disease  Negative    Depression  Negative      Total Score   Opioid Risk Tool Scoring  0    Opioid Risk Interpretation  Low Risk      ORT Scoring interpretation table:  Score <3 = Low Risk for SUD  Score between 4-7 = Moderate Risk for SUD  Score >8 = High Risk for Opioid Abuse   Risk Mitigation Strategies:  Patient Counseling: Covered Patient-Prescriber Agreement (PPA): Present and active  Notification to other healthcare providers: Done  Pharmacologic Plan: No change in therapy, at this time.             Post-Procedure Assessment  08/07/2017 Procedure: right lumbar facet radiofrequency Pre-procedure pain score:  4/10 Post-procedure pain score: 0/10         Influential Factors: BMI: 28.43 kg/m Intra-procedural challenges: None observed.         Assessment challenges: None detected.              Reported side-effects: None.        Post-procedural adverse reactions or complications: None reported         Sedation: Please see nurses note. When no sedatives are used, the analgesic levels obtained are directly associated to the effectiveness of the local anesthetics. However, when sedation is provided, the level of analgesia obtained during the initial 1 hour following the intervention, is believed to be the result of a combination of factors. These factors may include, but are not limited to: 1.  The effectiveness of the local anesthetics used. 2. The effects of the analgesic(s) and/or anxiolytic(s) used. 3. The degree of discomfort experienced by the patient at the time of the procedure. 4. The patients ability and reliability in recalling and recording the events. 5. The presence and influence of possible secondary gains and/or psychosocial factors. Reported result: Relief experienced during the 1st hour after the procedure: 100 % (Ultra-Short Term Relief)            Interpretative annotation: Clinically appropriate  result. Analgesia during this period is likely to be Local Anesthetic and/or IV Sedative (Analgesic/Anxiolytic) related.          Effects of local anesthetic: The analgesic effects attained during this period are directly associated to the localized infiltration of local anesthetics and therefore cary significant diagnostic value as to the etiological location, or anatomical origin, of the pain. Expected duration of relief is directly dependent on the pharmacodynamics of the local anesthetic used. Long-acting (4-6 hours) anesthetics used.  Reported result: Relief during the next 4 to 6 hour after the procedure: 100 % (Short-Term Relief)            Interpretative annotation: Clinically appropriate result. Analgesia during this period is likely to be Local Anesthetic-related.          Long-term benefit: Defined as the period of time past the expected duration of local anesthetics (1 hour for short-acting and 4-6 hours for long-acting). With the possible exception of prolonged sympathetic blockade from the local anesthetics, benefits during this period are typically attributed to, or associated with, other factors such as analgesic sensory neuropraxia, antiinflammatory effects, or beneficial biochemical changes provided by agents other than the local anesthetics.  Reported result: Extended relief following procedure: 80 % (Long-Term Relief)            Interpretative annotation:  Clinically appropriate result. Good relief. No permanent benefit expected. Inflammation plays a part in the etiology to the pain.          Current benefits: Defined as reported results that persistent at this point in time.   Analgesia: >50 %            Function: Jacqueline Thornton reports improvement in function ROM: Jacqueline Thornton reports improvement in ROM Interpretative annotation: Ongoing benefit.    Adequate RF ablation.          Interpretation: Results would suggest adequate radiofrequency ablation.                  Plan:  Please see "Plan of Care" for details.                Laboratory Chemistry  Inflammation Markers (CRP: Acute Phase) (ESR: Chronic Phase) Lab Results  Component Value Date   CRP 4.8 12/05/2016   ESRSEDRATE 2 12/05/2016                         Rheumatology Markers No results found for: Elayne Guerin, [facility]              Renal Function Markers Lab Results  Component Value Date   BUN 8 12/05/2016   CREATININE 0.88 12/05/2016   GFRAA 80 12/05/2016   GFRNONAA 70 12/05/2016                 Hepatic Function Markers Lab Results  Component Value Date   AST 19 12/05/2016   ALT 13 12/05/2016   ALBUMIN 3.7 12/05/2016   ALKPHOS 48 12/05/2016                 Electrolytes Lab Results  Component Value Date   NA 140 12/05/2016   K 4.0 12/05/2016   CL 103 12/05/2016   CALCIUM 9.3 12/05/2016   MG 2.2 12/05/2016                        Neuropathy Markers Lab Results  Component Value Date   VITAMINB12 202 (  L) 12/05/2016                 Bone Pathology Markers Lab Results  Component Value Date   25OHVITD1 50 12/05/2016   25OHVITD2 <1.0 12/05/2016   25OHVITD3 49 12/05/2016                         Coagulation Parameters Lab Results  Component Value Date   INR 1.0 03/02/2014   LABPROT 12.7 03/02/2014   PLT 197 01/26/2012                 Cardiovascular Markers Lab Results  Component Value Date   HGB 14.2 01/26/2012   HCT  41.1 01/26/2012                 CA Markers No results found for: CEA, CA125, LABCA2               Note: Lab results reviewed.  Recent Diagnostic Imaging Results  DG C-Arm 1-60 Min-No Report Fluoroscopy was utilized by the requesting physician.  No radiographic  interpretation.   Complexity Note: Imaging results reviewed. Results shared with Jacqueline Thornton, using Layman's terms.                         Meds   Current Outpatient Medications:  .  acetaminophen (TYLENOL) 325 MG tablet, Take by mouth., Disp: , Rfl:  .  alendronate (FOSAMAX) 35 MG tablet, Take 35 mg by mouth every 7 (seven) days. Take with a full glass of water on an empty stomach., Disp: , Rfl:  .  cholecalciferol (VITAMIN D) 400 UNITS TABS tablet, Take 1,000 Units by mouth daily., Disp: , Rfl:  .  DULoxetine (CYMBALTA) 60 MG capsule, Take 60 mg by mouth daily. , Disp: , Rfl:  .  enalapril (VASOTEC) 5 MG tablet, Take 5 mg by mouth daily., Disp: , Rfl:  .  esomeprazole (NEXIUM) 20 MG capsule, Take 20 mg by mouth daily at 12 noon., Disp: , Rfl:  .  fenofibrate 54 MG tablet, Take by mouth., Disp: , Rfl:  .  fluticasone (FLONASE) 50 MCG/ACT nasal spray, 2 sprays nightly., Disp: , Rfl:  .  Magnesium Oxide, Antacid, 500 MG CAPS, Take by mouth., Disp: , Rfl:  .  Melatonin 3 MG TABS, Take by mouth., Disp: , Rfl:  .  rosuvastatin (CRESTOR) 5 MG tablet, Take 5 mg by mouth every other day. , Disp: , Rfl:  .  [START ON 09/22/2017] cyclobenzaprine (FLEXERIL) 10 MG tablet, Take 1 tablet (10 mg total) by mouth 3 (three) times daily as needed for muscle spasms., Disp: 270 tablet, Rfl: 0 .  [START ON 09/22/2017] gabapentin (NEURONTIN) 300 MG capsule, Take 1 capsule (300 mg total) by mouth 2 (two) times daily., Disp: 180 capsule, Rfl: 0 .  [START ON 11/21/2017] oxyCODONE-acetaminophen (PERCOCET) 7.5-325 MG tablet, Take 1 tablet by mouth 5 (five) times daily as needed for severe pain., Disp: 150 tablet, Rfl: 0 .  [START ON 10/22/2017]  oxyCODONE-acetaminophen (PERCOCET) 7.5-325 MG tablet, Take 1 tablet by mouth 5 (five) times daily as needed for severe pain., Disp: 150 tablet, Rfl: 0 .  [START ON 09/22/2017] oxyCODONE-acetaminophen (PERCOCET) 7.5-325 MG tablet, Take 1 tablet by mouth 5 (five) times daily as needed for severe pain., Disp: 150 tablet, Rfl: 0  ROS  Constitutional: Denies any fever or chills Gastrointestinal: No reported hemesis, hematochezia, vomiting, or acute GI distress Musculoskeletal: Denies any  acute onset joint swelling, redness, loss of ROM, or weakness Neurological: No reported episodes of acute onset apraxia, aphasia, dysarthria, agnosia, amnesia, paralysis, loss of coordination, or loss of consciousness  Allergies  Jacqueline Thornton has No Known Allergies.  [place]  Drug: Jacqueline Thornton  reports that she does not use drugs. Alcohol:  reports that she does not drink alcohol. Tobacco:  reports that she has quit smoking. she has never used smokeless tobacco. Medical:  has a past medical history of Absolute anemia (04/11/2015), Angina pectoris ([place]), Anxiety, Arthritis, Arthropathy of sacroiliac joint (04/11/2015), Asthma, Atypical face pain (04/24/2015), CAD (coronary artery disease), Chronic back pain, Depression, Fibromyalgia, GERD (gastroesophageal reflux disease), 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) (04/11/2015), Hyperlipidemia, Hypertension, Low back pain (04/06/2015), Lumbar radicular pain (04/18/2015), Migraine, Narrowing of intervertebral disc space (04/11/2015), Sacroiliac joint pain (04/11/2015), and Spine disorder. Surgical: Jacqueline Thornton  has a past surgical history that includes Cholecystectomy; Abdominal hysterectomy; Neck surgery (Bilateral); Appendectomy; Neck surgery; Shoulder surgery; Colonoscopy with propofol (N/A, 07/19/2015); and caract surger. Family: family history includes Diabetes in her father and mother; Hypertension in her sister; Stroke in her mother.  Constitutional Exam  General  appearance: Well nourished, well developed, and well hydrated. In no apparent acute distress Vitals:   09/09/17 1058  BP: 124/73  Pulse: 79  Resp: 16  Temp: 98 F (36.7 C)  SpO2: 100%  Weight: 148 lb (67.1 kg)  Height: 5' 0.5" (1.537 m)   BMI Assessment: Estimated body mass index is 28.43 kg/m as calculated from the following:   Height as of this encounter: 5' 0.5" (1.537 m).   Weight as of this encounter: 148 lb (67.1 kg). Psych/Mental status: Alert, oriented x 3 (person, place, & time)       Eyes: PERLA Respiratory: No evidence of acute respiratory distress  Lumbar Spine Area Exam  Skin & Axial Inspection: No masses, redness, or swelling Alignment: Symmetrical Functional ROM: Unrestricted ROM      Stability: No instability detected Muscle Tone/Strength: Functionally intact. No obvious neuro-muscular anomalies detected. Sensory (Neurological): Unimpaired Palpation: Complains of area being tender to palpation       Provocative Tests: Lumbar Hyperextension and rotation test: Positive bilaterally for facet joint pain. Lumbar Lateral bending test: evaluation deferred today       Patrick's Maneuver: evaluation deferred today                    Gait & Posture Assessment  Ambulation: Unassisted Gait: Relatively normal for age and body habitus Posture: WNL   Lower Extremity Exam    Side: Right lower extremity  Side: Left lower extremity  Skin & Extremity Inspection: Skin color, temperature, and hair growth are WNL. No peripheral edema or cyanosis. No masses, redness, swelling, asymmetry, or associated skin lesions. No contractures.  Skin & Extremity Inspection: Skin color, temperature, and hair growth are WNL. No peripheral edema or cyanosis. No masses, redness, swelling, asymmetry, or associated skin lesions. No contractures.  Functional ROM: Unrestricted ROM          Functional ROM: Unrestricted ROM          Muscle Tone/Strength: Functionally intact. No obvious neuro-muscular  anomalies detected.  Muscle Tone/Strength: Functionally intact. No obvious neuro-muscular anomalies detected.  Sensory (Neurological): Unimpaired  Sensory (Neurological): Unimpaired  Palpation: No palpable anomalies  Palpation: No palpable anomalies   Assessment  Primary Diagnosis & Pertinent Problem List: The primary encounter diagnosis was Spondylosis of lumbar spine. Diagnoses of Lumbar facet syndrome (Bilateral) (R>L), Discogenic  low back pain (L3-4 and L4-5), Chronic pain syndrome, Neurogenic pain, Myofascial pain, and Long term current use of opiate analgesic were also pertinent to this visit.  Status Diagnosis  Persistent Persistent Persistent 1. Spondylosis of lumbar spine   2. Lumbar facet syndrome (Bilateral) (R>L)   3. Discogenic low back pain (L3-4 and L4-5)   4. Chronic pain syndrome   5. Neurogenic pain   6. Myofascial pain   7. Long term current use of opiate analgesic     Problems updated and reviewed during this visit: No problems updated. Plan of Care  Pharmacotherapy (Medications Ordered): Meds ordered this encounter  Medications  . oxyCODONE-acetaminophen (PERCOCET) 7.5-325 MG tablet    Sig: Take 1 tablet by mouth 5 (five) times daily as needed for severe pain.    Dispense:  150 tablet    Refill:  0    Do not place this medication, or any other prescription from our practice, on "Automatic Refill". Patient may have prescription filled one day early if pharmacy is closed on scheduled refill date. Do not fill until: 11/21/2017 To last until: 12/21/2017    Order Specific Question:   Supervising Provider    Answer:   Milinda Pointer [phone]  . oxyCODONE-acetaminophen (PERCOCET) 7.5-325 MG tablet    Sig: Take 1 tablet by mouth 5 (five) times daily as needed for severe pain.    Dispense:  150 tablet    Refill:  0    Do not place this medication, or any other prescription from our practice, on "Automatic Refill". Patient may have prescription filled one day early  if pharmacy is closed on scheduled refill date. Do not fill until:10/22/2017 To last until:11/21/2017    Order Specific Question:   Supervising Provider    Answer:   Milinda Pointer [phone]  . oxyCODONE-acetaminophen (PERCOCET) 7.5-325 MG tablet    Sig: Take 1 tablet by mouth 5 (five) times daily as needed for severe pain.    Dispense:  150 tablet    Refill:  0    Do not place this medication, or any other prescription from our practice, on "Automatic Refill". Patient may have prescription filled one day early if pharmacy is closed on scheduled refill date. Do not fill until:09/22/2017 To last until:10/22/2017    Order Specific Question:   Supervising Provider    Answer:   Milinda Pointer [phone]  . cyclobenzaprine (FLEXERIL) 10 MG tablet    Sig: Take 1 tablet (10 mg total) by mouth 3 (three) times daily as needed for muscle spasms.    Dispense:  270 tablet    Refill:  0    Do not place this medication, or any other prescription from our practice, on "Automatic Refill". Patient may have prescription filled one day early if pharmacy is closed on scheduled refill date.    Order Specific Question:   Supervising Provider    Answer:   Milinda Pointer [phone]  . gabapentin (NEURONTIN) 300 MG capsule    Sig: Take 1 capsule (300 mg total) by mouth 2 (two) times daily.    Dispense:  180 capsule    Refill:  0    Do not add this medication to the electronic "Automatic Refill" notification system. Patient may have prescription filled one day early if pharmacy is closed on scheduled refill date.    Order Specific Question:   Supervising Provider    Answer:   Milinda Pointer [[id number]]   New Prescriptions   No medications on file   Medications  administered today: Jacqueline Thornton had no medications administered during this visit. Lab-work, procedure(s), and/or referral(s): Orders Placed This Encounter  Procedures  . ToxASSURE Select 13 (MW), Urine   Imaging and/or  referral(s): None  Interventional therapies: Planned, scheduled, and/or pending: As scheduled   Considering:  Diagnostic caudal epidural steroid injection + diagnostic epidurogram Palliative Right sided lumbar facet radiofrequency ablation + Left S-I Block Diagnostic cervical epidural steroid injection Diagnostic bilateral cervical facet block Possible bilateral cervical facet radiofrequencyablation  Diagnostic bilateral lumbar facet block Possible bilateral lumbar facet radiofrequencyablation  Diagnostic left L4-5 lumbar epidural steroid injection  Diagnostic left S1 selective nerve root block Diagnostic bilateral sacroiliac joint block Possible bilateral sacroiliac joint radiofrequencyablation  Diagnostic right trochanteric bursa injection   Palliative PRN treatment(s):  Palliativecervical epidural steroid injection  Palliativebilateral cervical facet block Palliativebilateral lumbar facet block Palliativeleft L4-5 lumbar epiduralsteroid injection  Palliativeleft caudal epidural steroid injection  Palliativeleft S1 selective nerve root block Palliativebilateral sacroiliac joint block Palliativeright trochanteric bursa injec      Provider-requested follow-up: Return in about 3 months (around 12/10/2017) for MedMgmt with Me Dionisio David), in addition, Appointment As Scheduled.  Future Appointments  Date Time Provider [place]  09/16/2017  2:15 PM Milinda Pointer, MD [facility] None  12/10/2017  1:30 PM Vevelyn Francois, NP [facility] None   Primary Care Physician: Lavera Guise, MD Location: [facility] Note by: Vevelyn Francois NP Date: 09/09/2017; Time: 2:17 PM  Pain Score Disclaimer: We use the NRS-11 scale. This is a self-reported, subjective measurement of pain severity with only modest accuracy. It is used primarily to identify changes within a particular patient. It must be understood that outpatient  pain scales are significantly less accurate that those used for research, where they can be applied under ideal controlled circumstances with minimal exposure to variables. In reality, the score is likely to be a combination of pain intensity and pain affect, where pain affect describes the degree of emotional arousal or changes in action readiness caused by the sensory experience of pain. Factors such as social and work situation, setting, emotional state, anxiety levels, expectation, and prior pain experience may influence pain perception and show large inter-individual differences that may also be affected by time variables.  Patient instructions provided during this appointment: Patient Instructions  ____________________________________________________________________________________________  Medication Rules  Applies to: All patients receiving prescriptions (written or electronic).  Pharmacy of record: Pharmacy where electronic prescriptions will be sent. If written prescriptions are taken to a different pharmacy, please inform the nursing staff. The pharmacy listed in the electronic medical record should be the one where you would like electronic prescriptions to be sent.  Prescription refills: Only during scheduled appointments. Applies to both, written and electronic prescriptions.  NOTE: The following applies primarily to controlled substances (Opioid* Pain Medications).   Patient's responsibilities: 1. Pain Pills: Bring all pain pills to every appointment (except for procedure appointments). 2. Pill Bottles: Bring pills in original pharmacy bottle. Always bring newest bottle. Bring bottle, even if empty. 3. Medication refills: You are responsible for knowing and keeping track of what medications you need refilled. The day before your appointment, write a list of all prescriptions that need to be refilled. Bring that list to your appointment and give it to the admitting nurse.  Prescriptions will be written only during appointments. If you forget a medication, it will not be "Called in", "Faxed", or "electronically sent". You will need to get another appointment to get these prescribed.  4. Prescription Accuracy: You are responsible for carefully inspecting your prescriptions before leaving our office. Have the discharge nurse carefully go over each prescription with you, before taking them home. Make sure that your name is accurately spelled, that your address is correct. Check the name and dose of your medication to make sure it is accurate. Check the number of pills, and the written instructions to make sure they are clear and accurate. Make sure that you are given enough medication to last until your next medication refill appointment. 5. Taking Medication: Take medication as prescribed. Never take more pills than instructed. Never take medication more frequently than prescribed. Taking less pills or less frequently is permitted and encouraged, when it comes to controlled substances (written prescriptions).  6. Inform other Doctors: Always inform, all of your healthcare providers, of all the medications you take. 7. Pain Medication from other Providers: You are not allowed to accept any additional pain medication from any other Doctor or Healthcare provider. There are two exceptions to this rule. (see below) In the event that you require additional pain medication, you are responsible for notifying us, as stated below. 8. Medication Agreement: You are responsible for carefully reading and following our Medication Agreement. This must be signed before receiving any prescriptions from our practice. Safely store a copy of your signed Agreement. Violations to the Agreement will result in no further prescriptions. (Additional copies of our Medication Agreement are available upon request.) 9. Laws, Rules, & Regulations: All patients are expected to follow all Federal and [employer]  [employer], Rules, [employer]. Ignorance of the Laws does not constitute a valid excuse. The use of any illegal substances is prohibited. 10. Adopted CDC guidelines & recommendations: Target dosing levels will be at or below 60 MME/day. Use of benzodiazepines** is not recommended.  Exceptions: There are only two exceptions to the rule of not receiving pain medications from other Healthcare Providers. 1. Exception #1 (Emergencies): In the event of an emergency (i.e.: accident requiring emergency care), you are allowed to receive additional pain medication. However, you are responsible for: As soon as you are able, call our office [phone], at any time of the day or night, and leave a message stating your name, the date and nature of the emergency, and the name and dose of the medication prescribed. In the event that your call is answered by a member of our staff, make sure to document and save the date, time, and the name of the person that took your information.  2. Exception #2 (Planned Surgery): In the event that you are scheduled by another doctor or dentist to have any type of surgery or procedure, you are allowed (for a period no longer than 30 days), to receive additional pain medication, for the acute post-op pain. However, in this case, you are responsible for picking up a copy of our "Post-op Pain Management for Surgeons" handout, and giving it to your surgeon or dentist. This document is available at our office, and does not require an appointment to obtain it. Simply go to our office during business hours (Monday-Thursday from 8:00 AM to 4:00 PM) (Friday 8:00 AM to 12:00 Noon) or if you have a scheduled appointment with [place], prior to your surgery, and ask for it by name. In addition, you will need to provide [place] with your name, name of your surgeon, type of surgery, and date of procedure or surgery.  *Opioid medications include: morphine, codeine, oxycodone, oxymorphone, hydrocodone,  hydromorphone, meperidine, tramadol,  tapentadol, buprenorphine, fentanyl, methadone. **Benzodiazepine medications include: diazepam (Valium), alprazolam (Xanax), clonazepam (Klonopine), lorazepam (Ativan), clorazepate (Tranxene), chlordiazepoxide (Librium), estazolam (Prosom), oxazepam (Serax), temazepam (Restoril), triazolam (Halcion) (Last updated: 08/28/2017) ____________________________________________________________________________________________   Radiofrequency Lesioning Radiofrequency lesioning is a procedure that is performed to relieve pain. The procedure is often used for back, neck, or arm pain. Radiofrequency lesioning involves the use of a machine that creates radio waves to make heat. During the procedure, the heat is applied to the nerve that carries the pain signal. The heat damages the nerve and interferes with the pain signal. Pain relief usually starts about 2 weeks after the procedure and lasts for 6 months to 1 year. Tell a health care provider about:  Any allergies you have.  All medicines you are taking, including vitamins, herbs, eye drops, creams, and over-the-counter medicines.  Any problems you or family members have had with anesthetic medicines.  Any blood disorders you have.  Any surgeries you have had.  Any medical conditions you have.  Whether you are pregnant or may be pregnant. What are the risks? Generally, this is a safe procedure. However, problems may occur, including:  Pain or soreness at the injection site.  Infection at the injection site.  Damage to nerves or blood vessels.  What happens before the procedure?  Ask your health care provider about: ? Changing or stopping your regular medicines. This is especially important if you are taking diabetes medicines or blood thinners. ? Taking medicines such as aspirin and ibuprofen. These medicines can thin your blood. Do not take these medicines before your procedure if your health care  provider instructs you not to.  Follow instructions from your health care provider about eating or drinking restrictions.  Plan to have someone take you home after the procedure.  If you go home right after the procedure, plan to have someone with you for 24 hours. What happens during the procedure?  You will be given one or more of the following: ? A medicine to help you relax (sedative). ? A medicine to numb the area (local anesthetic).  You will be awake during the procedure. You will need to be able to talk with the health care provider during the procedure.  With the help of a type of X-ray (fluoroscopy), the health care provider will insert a radiofrequency needle into the area to be treated.  Next, a wire that carries the radio waves (electrode) will be put through the radiofrequency needle. An electrical pulse will be sent through the electrode to verify the correct nerve. You will feel a tingling sensation, and you may have muscle twitching.  Then, the tissue that is around the needle tip will be heated by an electric current that is passed using the radiofrequency machine. This will numb the nerves.  A bandage (dressing) will be put on the insertion area after the procedure is done. The procedure may vary among health care providers and hospitals. What happens after the procedure?  Your blood pressure, heart rate, breathing rate, and blood oxygen level will be monitored often until the medicines you were given have worn off.  Return to your normal activities as directed by your health care provider. This information is not intended to replace advice given to you by your health care provider. Make sure you discuss any questions you have with your health care provider. Document Released: 02/13/2011 Document Revised: 11/23/2015 Document Reviewed: 07/25/2014 Elsevier Interactive Patient Education  [employer].

## 2017-09-09 NOTE — Patient Instructions (Addendum)
____________________________________________________________________________________________  Medication Rules  Applies to: All patients receiving prescriptions (written or electronic).  Pharmacy of record: Pharmacy where electronic prescriptions will be sent. If written prescriptions are taken to a different pharmacy, please inform the nursing staff. The pharmacy listed in the electronic medical record should be the one where you would like electronic prescriptions to be sent.  Prescription refills: Only during scheduled appointments. Applies to both, written and electronic prescriptions.  NOTE: The following applies primarily to controlled substances (Opioid* Pain Medications).   Patient's responsibilities: 1. Pain Pills: Bring all pain pills to every appointment (except for procedure appointments). 2. Pill Bottles: Bring pills in original pharmacy bottle. Always bring newest bottle. Bring bottle, even if empty. 3. Medication refills: You are responsible for knowing and keeping track of what medications you need refilled. The day before your appointment, write a list of all prescriptions that need to be refilled. Bring that list to your appointment and give it to the admitting nurse. Prescriptions will be written only during appointments. If you forget a medication, it will not be "Called in", "Faxed", or "electronically sent". You will need to get another appointment to get these prescribed. 4. Prescription Accuracy: You are responsible for carefully inspecting your prescriptions before leaving our office. Have the discharge nurse carefully go over each prescription with you, before taking them home. Make sure that your name is accurately spelled, that your address is correct. Check the name and dose of your medication to make sure it is accurate. Check the number of pills, and the written instructions to make sure they are clear and accurate. Make sure that you are given enough medication to last  until your next medication refill appointment. 5. Taking Medication: Take medication as prescribed. Never take more pills than instructed. Never take medication more frequently than prescribed. Taking less pills or less frequently is permitted and encouraged, when it comes to controlled substances (written prescriptions).  6. Inform other Doctors: Always inform, all of your healthcare providers, of all the medications you take. 7. Pain Medication from other Providers: You are not allowed to accept any additional pain medication from any other Doctor or Healthcare provider. There are two exceptions to this rule. (see below) In the event that you require additional pain medication, you are responsible for notifying us, as stated below. 8. Medication Agreement: You are responsible for carefully reading and following our Medication Agreement. This must be signed before receiving any prescriptions from our practice. Safely store a copy of your signed Agreement. Violations to the Agreement will result in no further prescriptions. (Additional copies of our Medication Agreement are available upon request.) 9. Laws, Rules, & Regulations: All patients are expected to follow all Federal and State Laws, Statutes, Rules, & Regulations. Ignorance of the Laws does not constitute a valid excuse. The use of any illegal substances is prohibited. 10. Adopted CDC guidelines & recommendations: Target dosing levels will be at or below 60 MME/day. Use of benzodiazepines** is not recommended.  Exceptions: There are only two exceptions to the rule of not receiving pain medications from other Healthcare Providers. 1. Exception #1 (Emergencies): In the event of an emergency (i.e.: accident requiring emergency care), you are allowed to receive additional pain medication. However, you are responsible for: As soon as you are able, call our office [phone], at any time of the day or night, and leave a message stating your name, the  date and nature of the emergency, and the name and dose of the medication   prescribed. In the event that your call is answered by a member of our staff, make sure to document and save the date, time, and the name of the person that took your information.  2. Exception #2 (Planned Surgery): In the event that you are scheduled by another doctor or dentist to have any type of surgery or procedure, you are allowed (for a period no longer than 30 days), to receive additional pain medication, for the acute post-op pain. However, in this case, you are responsible for picking up a copy of our "Post-op Pain Management for Surgeons" handout, and giving it to your surgeon or dentist. This document is available at our office, and does not require an appointment to obtain it. Simply go to our office during business hours (Monday-Thursday from 8:00 AM to 4:00 PM) (Friday 8:00 AM to 12:00 Noon) or if you have a scheduled appointment with us, prior to your surgery, and ask for it by name. In addition, you will need to provide us with your name, name of your surgeon, type of surgery, and date of procedure or surgery.  *Opioid medications include: morphine, codeine, oxycodone, oxymorphone, hydrocodone, hydromorphone, meperidine, tramadol, tapentadol, buprenorphine, fentanyl, methadone. **Benzodiazepine medications include: diazepam (Valium), alprazolam (Xanax), clonazepam (Klonopine), lorazepam (Ativan), clorazepate (Tranxene), chlordiazepoxide (Librium), estazolam (Prosom), oxazepam (Serax), temazepam (Restoril), triazolam (Halcion) (Last updated: 08/28/2017) ____________________________________________________________________________________________   Radiofrequency Lesioning Radiofrequency lesioning is a procedure that is performed to relieve pain. The procedure is often used for back, neck, or arm pain. Radiofrequency lesioning involves the use of a machine that creates radio waves to make heat. During the procedure, the  heat is applied to the nerve that carries the pain signal. The heat damages the nerve and interferes with the pain signal. Pain relief usually starts about 2 weeks after the procedure and lasts for 6 months to 1 year. Tell a health care provider about:  Any allergies you have.  All medicines you are taking, including vitamins, herbs, eye drops, creams, and over-the-counter medicines.  Any problems you or family members have had with anesthetic medicines.  Any blood disorders you have.  Any surgeries you have had.  Any medical conditions you have.  Whether you are pregnant or may be pregnant. What are the risks? Generally, this is a safe procedure. However, problems may occur, including:  Pain or soreness at the injection site.  Infection at the injection site.  Damage to nerves or blood vessels.  What happens before the procedure?  Ask your health care provider about: ? Changing or stopping your regular medicines. This is especially important if you are taking diabetes medicines or blood thinners. ? Taking medicines such as aspirin and ibuprofen. These medicines can thin your blood. Do not take these medicines before your procedure if your health care provider instructs you not to.  Follow instructions from your health care provider about eating or drinking restrictions.  Plan to have someone take you home after the procedure.  If you go home right after the procedure, plan to have someone with you for 24 hours. What happens during the procedure?  You will be given one or more of the following: ? A medicine to help you relax (sedative). ? A medicine to numb the area (local anesthetic).  You will be awake during the procedure. You will need to be able to talk with the health care provider during the procedure.  With the help of a type of X-ray (fluoroscopy), the health care provider will insert a radiofrequency   needle into the area to be treated.  Next, a wire that  carries the radio waves (electrode) will be put through the radiofrequency needle. An electrical pulse will be sent through the electrode to verify the correct nerve. You will feel a tingling sensation, and you may have muscle twitching.  Then, the tissue that is around the needle tip will be heated by an electric current that is passed using the radiofrequency machine. This will numb the nerves.  A bandage (dressing) will be put on the insertion area after the procedure is done. The procedure may vary among health care providers and hospitals. What happens after the procedure?  Your blood pressure, heart rate, breathing rate, and blood oxygen level will be monitored often until the medicines you were given have worn off.  Return to your normal activities as directed by your health care provider. This information is not intended to replace advice given to you by your health care provider. Make sure you discuss any questions you have with your health care provider. Document Released: 02/13/2011 Document Revised: 11/23/2015 Document Reviewed: 07/25/2014 Elsevier Interactive Patient Education  2018 [employer]  

## 2017-09-09 NOTE — Progress Notes (Signed)
Nursing Pain Medication Assessment:  Safety precautions to be maintained throughout the outpatient stay will include: orient to surroundings, keep bed in low position, maintain call bell within reach at all times, provide assistance with transfer out of bed and ambulation.  Medication Inspection Compliance: Pill count conducted under aseptic conditions, in front of the patient. Neither the pills nor the bottle was removed from the patient's sight at any time. Once count was completed pills were immediately returned to the patient in their original bottle.  Medication: See above Pill/Patch Count: 59 of 150 pills remain Pill/Patch Appearance: Markings consistent with prescribed medication Bottle Appearance: Standard pharmacy container. Clearly labeled. Filled Date: 2 / 23 / 2019 Last Medication intake:  Today

## 2017-09-16 ENCOUNTER — Other Ambulatory Visit: Payer: Self-pay

## 2017-09-16 ENCOUNTER — Encounter: Payer: Self-pay | Admitting: Pain Medicine

## 2017-09-16 ENCOUNTER — Ambulatory Visit
Admission: RE | Admit: 2017-09-16 | Discharge: 2017-09-16 | Disposition: A | Discharge status: Home / Self Care | Payer: Medicare Other | Source: Ambulatory Visit | Attending: Pain Medicine | Admitting: Pain Medicine

## 2017-09-16 ENCOUNTER — Ambulatory Visit (HOSPITAL_BASED_OUTPATIENT_CLINIC_OR_DEPARTMENT_OTHER): Payer: Medicare Other | Admitting: Pain Medicine

## 2017-09-16 VITALS — BP 114/66 | HR 88 | Temp 98.4°F | Resp 12 | Ht 60.0 in | Wt 149.0 lb

## 2017-09-16 DIAGNOSIS — M5388 Other specified dorsopathies, sacral and sacrococcygeal region: Secondary | ICD-10-CM | POA: Insufficient documentation

## 2017-09-16 DIAGNOSIS — M47816 Spondylosis without myelopathy or radiculopathy, lumbar region: Secondary | ICD-10-CM

## 2017-09-16 DIAGNOSIS — Z9049 Acquired absence of other specified parts of digestive tract: Secondary | ICD-10-CM | POA: Diagnosis not present

## 2017-09-16 DIAGNOSIS — M545 Low back pain, unspecified: Secondary | ICD-10-CM

## 2017-09-16 DIAGNOSIS — M47817 Spondylosis without myelopathy or radiculopathy, lumbosacral region: Secondary | ICD-10-CM

## 2017-09-16 DIAGNOSIS — G8929 Other chronic pain: Secondary | ICD-10-CM

## 2017-09-16 DIAGNOSIS — M533 Sacrococcygeal disorders, not elsewhere classified: Secondary | ICD-10-CM

## 2017-09-16 DIAGNOSIS — Z79899 Other long term (current) drug therapy: Secondary | ICD-10-CM | POA: Insufficient documentation

## 2017-09-16 DIAGNOSIS — M47818 Spondylosis without myelopathy or radiculopathy, sacral and sacrococcygeal region: Secondary | ICD-10-CM

## 2017-09-16 DIAGNOSIS — G8918 Other acute postprocedural pain: Secondary | ICD-10-CM

## 2017-09-16 DIAGNOSIS — M47898 Other spondylosis, sacral and sacrococcygeal region: Secondary | ICD-10-CM | POA: Diagnosis not present

## 2017-09-16 MED ORDER — TRIAMCINOLONE ACETONIDE 40 MG/ML IJ SUSP
40.0000 mg | Freq: Once | INTRAMUSCULAR | Status: AC
  Administered 2017-09-16: 40 mg
  Filled 2017-09-16: qty 1

## 2017-09-16 MED ORDER — MIDAZOLAM HCL 5 MG/5ML IJ SOLN
1.0000 mg | INTRAMUSCULAR | Status: AC | PRN
  Administered 2017-09-16: 4 mg via INTRAVENOUS
  Filled 2017-09-16: qty 5

## 2017-09-16 MED ORDER — FENTANYL CITRATE (PF) 100 MCG/2ML IJ SOLN
25.0000 ug | INTRAMUSCULAR | Status: AC | PRN
  Administered 2017-09-16: 100 ug via INTRAVENOUS
  Filled 2017-09-16: qty 2

## 2017-09-16 MED ORDER — ROPIVACAINE HCL 2 MG/ML IJ SOLN
9.0000 mL | Freq: Once | INTRAMUSCULAR | Status: AC
  Administered 2017-09-16: 10 mL via PERINEURAL
  Filled 2017-09-16: qty 10

## 2017-09-16 MED ORDER — LACTATED RINGERS IV SOLN
1000.0000 mL | Freq: Once | INTRAVENOUS | Status: AC
  Administered 2017-09-16: 1000 mL via INTRAVENOUS

## 2017-09-16 MED ORDER — LIDOCAINE HCL 2 % IJ SOLN
20.0000 mL | Freq: Once | INTRAMUSCULAR | Status: AC
  Administered 2017-09-16: 400 mg
  Filled 2017-09-16: qty 20

## 2017-09-16 MED ORDER — HYDROCODONE-ACETAMINOPHEN 5-325 MG PO TABS: 1.0000 | ORAL_TABLET | Freq: Four times a day (QID) | ORAL | 0 refills | Status: DC | PRN

## 2017-09-16 NOTE — Progress Notes (Signed)
Patient's Name: Jacqueline Thornton  MRN: [id number]  Referring Provider: Lavera Guise, MD  DOB: 04-15-1951  PCP: Lavera Guise, MD  DOS: 09/16/2017  Note by: Gaspar Cola, MD  Service setting: Ambulatory outpatient  Specialty: Interventional Pain Management  Patient type: Established  Location: [facility] (AMB) Pain Management Facility  Visit type: Interventional Procedure   Primary Reason for Visit: Interventional Pain Management Treatment. CC: Back Pain (lower)  Procedure:       Anesthesia, Analgesia, Anxiolysis:  Type: Thermal Lumbar Facet, Medial Branch & Sacroiliac joint Radiofrequency Ablation Region: Lumbosacral Level: L2, L3, L4, L5, S1, S2, & S3 Medial Branch Level(s). These levels will denervate the L3-4, L4-5, and the L5-S1 lumbar facet joints, as well as the posterior Sacroiliac Joint innervation. Primary Purpose: Therapeutic Region: Posterolateral Lumbosacral Spine Laterality: Left  Type: Moderate (Conscious) Sedation combined with Local Anesthesia Indication(s): Analgesia and Anxiety Route: Intravenous (IV) IV Access: Secured Sedation: Meaningful verbal contact was maintained at all times during the procedure  Local Anesthetic: Lidocaine 1-2%   Indications: 1. Spondylosis without myelopathy or radiculopathy, lumbosacral region   2. Spondylosis without myelopathy or radiculopathy, sacral and sacrococcygeal region   3. Lumbar facet syndrome (Bilateral) (R>L)   4. Other specified dorsopathies, sacral and sacrococcygeal region   5. Chronic sacroiliac joint pain (Bilateral) (L>R)   6. Chronic low back pain (Secondary source of pain) (Bilateral) (midline to tailbone) (R>L)    Jacqueline Thornton has been dealing with the above chronic pain for longer than three months and has either failed to respond, was unable to tolerate, or simply did not get enough benefit from other more conservative therapies including, but not limited to: 1. Over-the-counter medications 2. Anti-inflammatory  medications 3. Muscle relaxants 4. Membrane stabilizers 5. Opioids 6. Physical therapy 7. Modalities (Heat, ice, etc.) 8. Invasive techniques such as nerve blocks. Jacqueline Thornton has attained more than 50% relief of the pain from a series of diagnostic injections conducted in separate occasions.  Pain Score: Pre-procedure: 8 /10 Post-procedure: 0-No pain/10  Pre-op Assessment:  Jacqueline Thornton is a 67 y.o. (year old), female patient, seen today for interventional treatment. She  has a past surgical history that includes Cholecystectomy; Abdominal hysterectomy; Neck surgery (Bilateral); Appendectomy; Neck surgery; Shoulder surgery; Colonoscopy with propofol (N/A, 07/19/2015); and caract surger. Jacqueline Thornton has a current medication list which includes the following prescription(s): acetaminophen, alendronate, cholecalciferol, cyclobenzaprine, duloxetine, enalapril, esomeprazole, fenofibrate, fluticasone, gabapentin, magnesium oxide (antacid), melatonin, oxycodone-acetaminophen, oxycodone-acetaminophen, oxycodone-acetaminophen, rosuvastatin, and hydrocodone-acetaminophen. Her primarily concern today is the Back Pain (lower)  Initial Vital Signs:  Pulse Rate: 88 Temp: 98.4 F (36.9 C) Resp: 16 BP: 101/68 SpO2: 97 %  BMI: Estimated body mass index is 29.1 kg/m as calculated from the following:   Height as of this encounter: 5' (1.524 m).   Weight as of this encounter: 149 lb (67.6 kg).  Risk Assessment: Allergies: Reviewed. She has No Known Allergies.  Allergy Precautions: None required Coagulopathies: Reviewed. None identified.  Blood-thinner therapy: None at this time Active Infection(s): Reviewed. None identified. Jacqueline Thornton is afebrile  Site Confirmation: Jacqueline Thornton was asked to confirm the procedure and laterality before marking the site Procedure checklist: Completed Consent: Before the procedure and under the influence of no sedative(s), amnesic(s), or anxiolytics, the patient was  informed of the treatment options, risks and possible complications. To fulfill our ethical and legal obligations, as recommended by the American Medical Association's Code of Ethics, I have informed the patient of my clinical impression; the  nature and purpose of the treatment or procedure; the risks, benefits, and possible complications of the intervention; the alternatives, including doing nothing; the risk(s) and benefit(s) of the alternative treatment(s) or procedure(s); and the risk(s) and benefit(s) of doing nothing. The patient was provided information about the general risks and possible complications associated with the procedure. These may include, but are not limited to: failure to achieve desired goals, infection, bleeding, organ or nerve damage, allergic reactions, paralysis, and death. In addition, the patient was informed of those risks and complications associated to Spine-related procedures, such as failure to decrease pain; infection (i.e.: Meningitis, epidural or intraspinal abscess); bleeding (i.e.: epidural hematoma, subarachnoid hemorrhage, or any other type of intraspinal or peri-dural bleeding); organ or nerve damage (i.e.: Any type of peripheral nerve, nerve root, or spinal cord injury) with subsequent damage to sensory, motor, and/or autonomic systems, resulting in permanent pain, numbness, and/or weakness of one or several areas of the body; allergic reactions; (i.e.: anaphylactic reaction); and/or death. Furthermore, the patient was informed of those risks and complications associated with the medications. These include, but are not limited to: allergic reactions (i.e.: anaphylactic or anaphylactoid reaction(s)); adrenal axis suppression; blood sugar elevation that in diabetics may result in ketoacidosis or comma; water retention that in patients with history of congestive heart failure may result in shortness of breath, pulmonary edema, and decompensation with resultant heart  failure; weight gain; swelling or edema; medication-induced neural toxicity; particulate matter embolism and blood vessel occlusion with resultant organ, and/or nervous system infarction; and/or aseptic necrosis of one or more joints. Finally, the patient was informed that Medicine is not an exact science; therefore, there is also the possibility of unforeseen or unpredictable risks and/or possible complications that may result in a catastrophic outcome. The patient indicated having understood very clearly. We have given the patient no guarantees and we have made no promises. Enough time was given to the patient to ask questions, all of which were answered to the patient's satisfaction. Ms. Turi has indicated that she wanted to continue with the procedure. Attestation: I, the ordering provider, attest that I have discussed with the patient the benefits, risks, side-effects, alternatives, likelihood of achieving goals, and potential problems during recovery for the procedure that I have provided informed consent. Date  Time: 09/16/2017  2:19 PM  Pre-Procedure Preparation:  Monitoring: As per clinic protocol. Respiration, ETCO2, SpO2, BP, heart rate and rhythm monitor placed and checked for adequate function Safety Precautions: Patient was assessed for positional comfort and pressure points before starting the procedure. Time-out: I initiated and conducted the "Time-out" before starting the procedure, as per protocol. The patient was asked to participate by confirming the accuracy of the "Time Out" information. Verification of the correct person, site, and procedure were performed and confirmed by me, the nursing staff, and the patient. "Time-out" conducted as per Joint Commission's Universal Protocol (UP.01.01.01). Time: 1517  Description of Procedure:       Position: Prone Laterality: Left Level: L2, L3, L4, L5, S1, S2, & S3 Medial Branch Level(s), at the L3-4, L4-5, and the L5-S1 lumbar facet  joints, as well as the posterior Sacroiliac Joint. Area Prepped: Lumbosacral Prepping solution: ChloraPrep (2% chlorhexidine gluconate and 70% isopropyl alcohol) Safety Precautions: Aspiration looking for blood return was conducted prior to all injections. At no point did we inject any substances, as a needle was being advanced. Before injecting, the patient was told to immediately notify me if she was experiencing any new onset of "ringing in the  ears, or metallic taste in the mouth". No attempts were made at seeking any paresthesias. Safe injection practices and needle disposal techniques used. Medications properly checked for expiration dates. SDV (single dose vial) medications used. After the completion of the procedure, all disposable equipment used was discarded in the proper designated medical waste containers. Local Anesthesia: Protocol guidelines were followed. The patient was positioned over the fluoroscopy table. The area was prepped in the usual manner. The time-out was completed. The target area was identified using fluoroscopy. A 12-in long, straight, sterile hemostat was used with fluoroscopic guidance to locate the targets for each level blocked. Once located, the skin was marked with an approved surgical skin marker. Once all sites were marked, the skin (epidermis, dermis, and hypodermis), as well as deeper tissues (fat, connective tissue and muscle) were infiltrated with a small amount of a short-acting local anesthetic, loaded on a 10cc syringe with a 25G, 1.5-in  Needle. An appropriate amount of time was allowed for local anesthetics to take effect before proceeding to the next step. Local Anesthetic: Lidocaine 2.0% The unused portion of the local anesthetic was discarded in the proper designated containers. Technical explanation of process:  Radiofrequency Ablation (RFA) L2 Medial Branch Nerve RFA: The target area for the L2 medial branch is at the junction of the postero-lateral aspect  of the superior articular process and the superior, posterior, and medial edge of the transverse process of L3. Under fluoroscopic guidance, a Radiofrequency needle was inserted until contact was made with os over the superior postero-lateral aspect of the pedicular shadow (target area). Sensory and motor testing was conducted to properly adjust the position of the needle. Once satisfactory placement of the needle was achieved, the numbing solution was slowly injected after negative aspiration for blood. 2.0 mL of the nerve block solution was injected without difficulty or complication. After waiting for at least 3 minutes, the ablation was performed. Once completed, the needle was removed intact. L3 Medial Branch Nerve RFA: The target area for the L3 medial branch is at the junction of the postero-lateral aspect of the superior articular process and the superior, posterior, and medial edge of the transverse process of L4. Under fluoroscopic guidance, a Radiofrequency needle was inserted until contact was made with os over the superior postero-lateral aspect of the pedicular shadow (target area). Sensory and motor testing was conducted to properly adjust the position of the needle. Once satisfactory placement of the needle was achieved, the numbing solution was slowly injected after negative aspiration for blood. 2.0 mL of the nerve block solution was injected without difficulty or complication. After waiting for at least 3 minutes, the ablation was performed. Once completed, the needle was removed intact. L4 Medial Branch Nerve RFA: The target area for the L4 medial branch is at the junction of the postero-lateral aspect of the superior articular process and the superior, posterior, and medial edge of the transverse process of L5. Under fluoroscopic guidance, a Radiofrequency needle was inserted until contact was made with os over the superior postero-lateral aspect of the pedicular shadow (target area). Sensory  and motor testing was conducted to properly adjust the position of the needle. Once satisfactory placement of the needle was achieved, the numbing solution was slowly injected after negative aspiration for blood. 2.0 mL of the nerve block solution was injected without difficulty or complication. After waiting for at least 3 minutes, the ablation was performed. Once completed, the needle was removed intact. L5 Medial Branch Nerve RFA: The target area  for the L5 medial branch is at the junction of the postero-lateral aspect of the superior articular process of S1 and the superior, posterior, and medial edge of the sacral ala. Under fluoroscopic guidance, a Radiofrequency needle was inserted until contact was made with os over the superior postero-lateral aspect of the pedicular shadow (target area). Sensory and motor testing was conducted to properly adjust the position of the needle. Once satisfactory placement of the needle was achieved, the numbing solution was slowly injected after negative aspiration for blood. 2.0 mL of the nerve block solution was injected without difficulty or complication. After waiting for at least 3 minutes, the ablation was performed. Once completed, the needle was removed intact. S1 Primary Dorsal Rami and Lateral Branch Nerve RFA: The target area for the S1 medial branch is located inferior to the junction of the S1 superior articular process and the L5 inferior articular process, posterior, inferior, and lateral to the 6 o'clock position of the L5-S1 facet joint, just superior to the S1 posterior foramen. Under fluoroscopic guidance, the Radiofrequency needle was advanced until contact was made with os over the Target area. Sensory and motor testing was conducted to properly adjust the position of the needle. Once satisfactory placement of the needle was achieved, the numbing solution was slowly injected after negative aspiration for blood. 2.0 mL of the nerve block solution was  injected without difficulty or complication. After waiting for at least 3 minutes, the ablation was performed. Once completed, the needle was removed intact. S2 Primary Dorsal Rami and Lateral Branch Nerve RFA: The target area for the S2 medial branch is at the posterior superior lateral of the S2 posterior neural foramen. Under fluoroscopic guidance, the Radiofrequency needle was advanced until contact was made with os over the Target area. Sensory and motor testing was conducted to properly adjust the position of the needle. Once satisfactory placement of the needle was achieved, the numbing solution was slowly injected after negative aspiration for blood. 2.0 mL of the nerve block solution was injected without difficulty or complication. After waiting for at least 3 minutes, the ablation was performed. Once completed, the needle was removed intact. S3 Primary Dorsal Rami and Lateral Branch Nerve RFA: The target area for the S3 medial branch is at the posterior superior lateral of the S3 posterior neural foramen. Under fluoroscopic guidance, the Radiofrequency needle was advanced until contact was made with os over the Target area. Sensory and motor testing was conducted to properly adjust the position of the needle. Once satisfactory placement of the needle was achieved, the numbing solution was slowly injected after negative aspiration for blood. 2.0 mL of the nerve block solution was injected without difficulty or complication. After waiting for at least 3 minutes, the ablation was performed. Once completed, the needle was removed intact. Radiofrequency lesioning (ablation):  Radiofrequency Generator: NeuroTherm [id number] Sensory Stimulation Parameters: 50 Hz was used to locate & identify the nerve, making sure that the needle was positioned such that there was no sensory stimulation below 0.3 V or above 0.7 V. Motor Stimulation Parameters: 2 Hz was used to evaluate the motor component. Care was taken not to  lesion any nerves that demonstrated motor stimulation of the lower extremities at an output of less than 2.5 times that of the sensory threshold, or a maximum of 2.0 V. Lesioning Technique Parameters: Standard Radiofrequency settings. (Not bipolar or pulsed.) Temperature Settings: 80 degrees C Lesioning time: 60 seconds Intra-operative Compliance: Compliant Materials & Medications: Needle(s) (Electrode/Cannula) Type: Teflon-coated,  curved tip, Radiofrequency needle(s) Gauge: 22G Length: 10cm Numbing solution: 0.2% PF-Ropivacaine + Triamcinolone (40 mg/mL) diluted to a final concentration of 4 mg of Triamcinolone/mL of Ropivacaine The unused portion of the solution was discarded in the proper designated containers.  Once the entire procedure was completed, the treated area was cleaned, making sure to leave some of the prepping solution back to take advantage of its long term bactericidal properties.    Illustration of the posterior view of the lumbar spine and the posterior neural structures. Laminae of L2 through S1 are labeled. DPRL5, dorsal primary ramus of L5; DPRS1, dorsal primary ramus of S1; DPR3, dorsal primary ramus of L3; FJ, facet (zygapophyseal) joint L3-L4; I, inferior articular process of L4; LB1, lateral branch of dorsal primary ramus of L1; IAB, inferior articular branches from L3 medial branch (supplies L4-L5 facet joint); IBP, intermediate branch plexus; MB3, medial branch of dorsal primary ramus of L3; NR3, third lumbar nerve root; S, superior articular process of L5; SAB, superior articular branches from L4 (supplies L4-5 facet joint also); TP3, transverse process of L3.  Vitals:   09/16/17 1547 09/16/17 1557 09/16/17 1607 09/16/17 1617  BP: 125/68 117/83 114/74 114/66  Pulse:      Resp: (!) 27 16 16 12   Temp:  98.4 F (36.9 C)    TempSrc:  Temporal    SpO2: 97% 95% 100% 100%  Weight:      Height:        Start Time: 1517 hrs. End Time: 1545 hrs.  Imaging Guidance  (Spinal):  Type of Imaging Technique: Fluoroscopy Guidance (Spinal) Indication(s): Assistance in needle guidance and placement for procedures requiring needle placement in or near specific anatomical locations not easily accessible without such assistance. Exposure Time: Please see nurses notes. Contrast: None used. Fluoroscopic Guidance: I was personally present during the use of fluoroscopy. "Tunnel Vision Technique" used to obtain the best possible view of the target area. Parallax error corrected before commencing the procedure. "Direction-depth-direction" technique used to introduce the needle under continuous pulsed fluoroscopy. Once target was reached, antero-posterior, oblique, and lateral fluoroscopic projection used confirm needle placement in all planes. Images permanently stored in EMR. Interpretation: No contrast injected. I personally interpreted the imaging intraoperatively. Adequate needle placement confirmed in multiple planes. Permanent images saved into the patient's record.  Antibiotic Prophylaxis:   Anti-infectives (From admission, onward)   None     Indication(s): None identified  Post-operative Assessment:  Post-procedure Vital Signs:  Pulse Rate: 88 Temp: 98.4 F (36.9 C) Resp: 12 BP: 114/66 SpO2: 100 %  EBL: None  Complications: No immediate post-treatment complications observed by team, or reported by patient.  Note: The patient tolerated the entire procedure well. A repeat set of vitals were taken after the procedure and the patient was kept under observation following institutional policy, for this type of procedure. Post-procedural neurological assessment was performed, showing return to baseline, prior to discharge. The patient was provided with post-procedure discharge instructions, including a section on how to identify potential problems. Should any problems arise concerning this procedure, the patient was given instructions to immediately contact us, at  any time, without hesitation. In any case, we plan to contact the patient by telephone for a follow-up status report regarding this interventional procedure.  Comments:  No additional relevant information.  Plan of Care    Imaging Orders     DG C-Arm 1-60 Min-No Report  Procedure Orders     Radiofrequency,Lumbar     Radiofrequency Sacroiliac Joint  Medications ordered for procedure: Meds ordered this encounter  Medications  . lidocaine (XYLOCAINE) 2 % (with pres) injection 400 mg  . midazolam (VERSED) 5 MG/5ML injection 1-2 mg    Make sure Flumazenil is available in the pyxis when using this medication. If oversedation occurs, administer 0.2 mg IV over 15 sec. If after 45 sec no response, administer 0.2 mg again over 1 min; may repeat at 1 min intervals; not to exceed 4 doses (1 mg)  . fentaNYL (SUBLIMAZE) injection 25-50 mcg    Make sure Narcan is available in the pyxis when using this medication. In the event of respiratory depression (RR< 8/min): Titrate NARCAN (naloxone) in increments of 0.1 to 0.2 mg IV at 2-3 minute intervals, until desired degree of reversal.  . lactated ringers infusion 1,000 mL  . ropivacaine (PF) 2 mg/mL (0.2%) (NAROPIN) injection 9 mL  . triamcinolone acetonide (KENALOG-40) injection 40 mg  . ropivacaine (PF) 2 mg/mL (0.2%) (NAROPIN) injection 9 mL  . triamcinolone acetonide (KENALOG-40) injection 40 mg  . HYDROcodone-acetaminophen (NORCO/VICODIN) 5-325 MG tablet    Sig: Take 1 tablet by mouth every 6 (six) hours as needed for up to 7 days for moderate pain.    Dispense:  28 tablet    Refill:  0    For acute post-operative pain. Not to be refilled. To last 7 days.   Medications administered: We administered lidocaine, midazolam, fentaNYL, lactated ringers, ropivacaine (PF) 2 mg/mL (0.2%), triamcinolone acetonide, ropivacaine (PF) 2 mg/mL (0.2%), and triamcinolone acetonide.  See the medical record for exact dosing, route, and time of  administration.  New Prescriptions   HYDROCODONE-ACETAMINOPHEN (NORCO/VICODIN) 5-325 MG TABLET    Take 1 tablet by mouth every 6 (six) hours as needed for up to 7 days for moderate pain.   Disposition: Discharge home  Discharge Date & Time: 09/16/2017; 1620 hrs.   Physician-requested Follow-up: Return for Post-RFA eval in 6 wks, w/ Dionisio David, NP.  Future Appointments  Date Time Provider [place]  10/28/2017 11:00 AM Vevelyn Francois, NP [facility] None  12/10/2017  1:30 PM Vevelyn Francois, NP [facility] None   Primary Care Physician: Lavera Guise, MD Location: [facility] Note by: Gaspar Cola, MD Date: 09/16/2017; Time: 3:49 PM  Disclaimer:  Medicine is not an Chief Strategy Officer. The only guarantee in medicine is that nothing is guaranteed. It is important to note that the decision to proceed with this intervention was based on the information collected from the patient. The Data and conclusions were drawn from the patient's questionnaire, the interview, and the physical examination. Because the information was provided in large part by the patient, it cannot be guaranteed that it has not been purposely or unconsciously manipulated. Every effort has been made to obtain as much relevant data as possible for this evaluation. It is important to note that the conclusions that lead to this procedure are derived in large part from the available data. Always take into account that the treatment will also be dependent on availability of resources and existing treatment guidelines, considered by other Pain Management Practitioners as being common knowledge and practice, at the time of the intervention. For Medico-Legal purposes, it is also important to point out that variation in procedural techniques and pharmacological choices are the acceptable norm. The indications, contraindications, technique, and results of the above procedure should only be interpreted and  judged by a Board-Certified Interventional Pain Specialist with extensive familiarity and expertise in the same exact  procedure and technique.

## 2017-09-16 NOTE — Patient Instructions (Addendum)
____________________________________________________________________________________________  Post-Procedure Discharge Instructions  Instructions:  Apply ice: Fill a plastic sandwich bag with crushed ice. Cover it with a small towel and apply to injection site. Apply for 15 minutes then remove x 15 minutes. Repeat sequence on day of procedure, until you go to bed. The purpose is to minimize swelling and discomfort after procedure.  Apply heat: Apply heat to procedure site starting the day following the procedure. The purpose is to treat any soreness and discomfort from the procedure.  Food intake: Start with clear liquids (like water) and advance to regular food, as tolerated.   Physical activities: Keep activities to a minimum for the first 8 hours after the procedure.   Driving: If you have received any sedation, you are not allowed to drive for 24 hours after your procedure.  Blood thinner: Restart your blood thinner 6 hours after your procedure. (Only for those taking blood thinners)  Insulin: As soon as you can eat, you may resume your normal dosing schedule. (Only for those taking insulin)  Infection prevention: Keep procedure site clean and dry.  Post-procedure Pain Diary: Extremely important that this be done correctly and accurately. Recorded information will be used to determine the next step in treatment.  Pain evaluated is that of treated area only. Do not include pain from an untreated area.  Complete every hour, on the hour, for the initial 8 hours. Set an alarm to help you do this part accurately.  Do not go to sleep and have it completed later. It will not be accurate.  Follow-up appointment: Keep your follow-up appointment after the procedure. Usually 2 weeks for most procedures. (6 weeks in the case of radiofrequency.) Bring you pain diary.   Expect:  From numbing medicine (AKA: Local Anesthetics): Numbness or decrease in pain.  Onset: Full effect within 15  minutes of injected.  Duration: It will depend on the type of local anesthetic used. On the average, 1 to 8 hours.   From steroids: Decrease in swelling or inflammation. Once inflammation is improved, relief of the pain will follow.  Onset of benefits: Depends on the amount of swelling present. The more swelling, the longer it will take for the benefits to be seen. In some cases, up to 10 days.  Duration: Steroids will stay in the system x 2 weeks. Duration of benefits will depend on multiple posibilities including persistent irritating factors.  From procedure: Some discomfort is to be expected once the numbing medicine wears off. This should be minimal if ice and heat are applied as instructed.  Call if:  You experience numbness and weakness that gets worse with time, as opposed to wearing off.  New onset bowel or bladder incontinence. (This applies to Spinal procedures only)  Emergency Numbers:  Durning business hours (Monday - Thursday, 8:00 AM - 4:00 PM) (Friday, 9:00 AM - 12:00 Noon): [phone]  After hours: [phone] ____________________________________________________________________________________________   Pain Management Discharge Instructions  General Discharge Instructions :  If you need to reach your doctor call: Monday-Friday 8:00 am - 4:00 pm at [phone] or toll free [phone].  After clinic hours [phone] to have operator reach doctor.  Bring all of your medication bottles to all your appointments in the pain clinic.  To cancel or reschedule your appointment with Pain Management please remember to call 24 hours in advance to avoid a fee.  Refer to the educational materials which you have been given on: General Risks, I had my Procedure. Discharge Instructions, Post Sedation.    Post Procedure Instructions:  The drugs you were given will stay in your system until tomorrow, so for the next 24 hours you should not drive, make any legal  decisions or drink any alcoholic beverages.  You may eat anything you prefer, but it is better to start with liquids then soups and crackers, and gradually work up to solid foods.  Please notify your doctor immediately if you have any unusual bleeding, trouble breathing or pain that is not related to your normal pain.  Depending on the type of procedure that was done, some parts of your body may feel week and/or numb.  This usually clears up by tonight or the next day.  Walk with the use of an assistive device or accompanied by an adult for the 24 hours.  You may use ice on the affected area for the first 24 hours.  Put ice in a Ziploc bag and cover with a towel and place against area 15 minutes on 15 minutes off.  You may switch to heat after 24 hours.Radiofrequency Lesioning Radiofrequency lesioning is a procedure that is performed to relieve pain. The procedure is often used for back, neck, or arm pain. Radiofrequency lesioning involves the use of a machine that creates radio waves to make heat. During the procedure, the heat is applied to the nerve that carries the pain signal. The heat damages the nerve and interferes with the pain signal. Pain relief usually starts about 2 weeks after the procedure and lasts for 6 months to 1 year. Tell a health care provider about:  Any allergies you have.  All medicines you are taking, including vitamins, herbs, eye drops, creams, and over-the-counter medicines.  Any problems you or family members have had with anesthetic medicines.  Any blood disorders you have.  Any surgeries you have had.  Any medical conditions you have.  Whether you are pregnant or may be pregnant. What are the risks? Generally, this is a safe procedure. However, problems may occur, including:  Pain or soreness at the injection site.  Infection at the injection site.  Damage to nerves or blood vessels.  What happens before the procedure?  Ask your health care  provider about: ? Changing or stopping your regular medicines. This is especially important if you are taking diabetes medicines or blood thinners. ? Taking medicines such as aspirin and ibuprofen. These medicines can thin your blood. Do not take these medicines before your procedure if your health care provider instructs you not to.  Follow instructions from your health care provider about eating or drinking restrictions.  Plan to have someone take you home after the procedure.  If you go home right after the procedure, plan to have someone with you for 24 hours. What happens during the procedure?  You will be given one or more of the following: ? A medicine to help you relax (sedative). ? A medicine to numb the area (local anesthetic).  You will be awake during the procedure. You will need to be able to talk with the health care provider during the procedure.  With the help of a type of X-ray (fluoroscopy), the health care provider will insert a radiofrequency needle into the area to be treated.  Next, a wire that carries the radio waves (electrode) will be put through the radiofrequency needle. An electrical pulse will be sent through the electrode to verify the correct nerve. You will feel a tingling sensation, and you may have muscle twitching.  Then, the tissue that   is around the needle tip will be heated by an electric current that is passed using the radiofrequency machine. This will numb the nerves.  A bandage (dressing) will be put on the insertion area after the procedure is done. The procedure may vary among health care providers and hospitals. What happens after the procedure?  Your blood pressure, heart rate, breathing rate, and blood oxygen level will be monitored often until the medicines you were given have worn off.  Return to your normal activities as directed by your health care provider. This information is not intended to replace advice given to you by your health  care provider. Make sure you discuss any questions you have with your health care provider. Document Released: 02/13/2011 Document Revised: 11/23/2015 Document Reviewed: 07/25/2014 [employer] Interactive Patient Education  2018 [employer]  

## 2017-09-17 ENCOUNTER — Telehealth: Payer: Self-pay

## 2017-09-17 NOTE — Telephone Encounter (Signed)
No answer. Left message on Am to call if needed. 

## 2017-09-24 ENCOUNTER — Telehealth: Payer: Self-pay | Admitting: *Deleted

## 2017-09-24 NOTE — Telephone Encounter (Signed)
Spoke with patient she is having burning type pain at site of last RF, difficulty walking d/t pain and states she has yeast in her mouth. Denies any loss of bowel or bladder or fever.   States this began a couple of days ago and she has taken all of her breakthrough pain medicine.  Offered patient appt this afternoon with Crystal, patient unable to get her today, will see patient tomorrow at 1315 for evaluation.

## 2017-09-25 ENCOUNTER — Other Ambulatory Visit: Payer: Self-pay

## 2017-09-25 ENCOUNTER — Ambulatory Visit: Payer: Medicare Other | Attending: Nurse Practitioner | Admitting: Nurse Practitioner

## 2017-09-25 ENCOUNTER — Encounter: Payer: Self-pay | Admitting: Nurse Practitioner

## 2017-09-25 VITALS — BP 131/66 | HR 93 | Temp 98.3°F | Resp 18 | Ht 60.5 in | Wt 147.0 lb

## 2017-09-25 DIAGNOSIS — G894 Chronic pain syndrome: Secondary | ICD-10-CM | POA: Diagnosis not present

## 2017-09-25 DIAGNOSIS — M47896 Other spondylosis, lumbar region: Secondary | ICD-10-CM | POA: Diagnosis not present

## 2017-09-25 DIAGNOSIS — Z79899 Other long term (current) drug therapy: Secondary | ICD-10-CM | POA: Insufficient documentation

## 2017-09-25 DIAGNOSIS — K219 Gastro-esophageal reflux disease without esophagitis: Secondary | ICD-10-CM | POA: Diagnosis not present

## 2017-09-25 DIAGNOSIS — Z87891 Personal history of nicotine dependence: Secondary | ICD-10-CM | POA: Insufficient documentation

## 2017-09-25 DIAGNOSIS — F419 Anxiety disorder, unspecified: Secondary | ICD-10-CM | POA: Diagnosis not present

## 2017-09-25 DIAGNOSIS — I10 Essential (primary) hypertension: Secondary | ICD-10-CM | POA: Insufficient documentation

## 2017-09-25 DIAGNOSIS — M47816 Spondylosis without myelopathy or radiculopathy, lumbar region: Secondary | ICD-10-CM

## 2017-09-25 DIAGNOSIS — M47818 Spondylosis without myelopathy or radiculopathy, sacral and sacrococcygeal region: Secondary | ICD-10-CM | POA: Insufficient documentation

## 2017-09-25 DIAGNOSIS — M549 Dorsalgia, unspecified: Secondary | ICD-10-CM | POA: Diagnosis present

## 2017-09-25 DIAGNOSIS — E785 Hyperlipidemia, unspecified: Secondary | ICD-10-CM | POA: Insufficient documentation

## 2017-09-25 DIAGNOSIS — G8918 Other acute postprocedural pain: Secondary | ICD-10-CM

## 2017-09-25 DIAGNOSIS — Z9049 Acquired absence of other specified parts of digestive tract: Secondary | ICD-10-CM | POA: Diagnosis not present

## 2017-09-25 DIAGNOSIS — Z9889 Other specified postprocedural states: Secondary | ICD-10-CM | POA: Insufficient documentation

## 2017-09-25 DIAGNOSIS — F329 Major depressive disorder, single episode, unspecified: Secondary | ICD-10-CM | POA: Insufficient documentation

## 2017-09-25 DIAGNOSIS — Z90710 Acquired absence of both cervix and uterus: Secondary | ICD-10-CM | POA: Insufficient documentation

## 2017-09-25 MED ORDER — HYDROCODONE-ACETAMINOPHEN 5-325 MG PO TABS: 1.0000 | ORAL_TABLET | Freq: Four times a day (QID) | ORAL | 0 refills | Status: AC | PRN

## 2017-09-25 NOTE — Progress Notes (Signed)
Patient's Name: CHRISTIAN TREADWAY  MRN: [id number]  Referring Provider: Lavera Guise, MD  DOB: [birth year]  PCP: Lavera Guise, MD  DOS: 09/25/2017  Note by: Vevelyn Francois NP  Service setting: Ambulatory outpatient  Specialty: Interventional Pain Management  Location: [facility] (AMB) Pain Management Facility    Patient type: Established    Primary Reason(s) for Visit: Evaluation of chronic illnesses with exacerbation, or progression (Level of risk: moderate) CC: Back Pain (burning and tenderness left and low post RFA)  HPI  Ms. Roadcap is a 67 y.o. year old, female patient, who comes today for a follow-up evaluation. She has Chronic pain syndrome; Gout; Hyperlipidemia; Headache, migraine; Panic attack; Chronic low back pain (Secondary source of pain) (Bilateral) (midline to tailbone) (R>L); Spondylosis of lumbar spine; Lumbar annular disc tear (L4-5); Discogenic low back pain (L3-4 and L4-5); Lumbar facet hypertrophy; Lumbar facet syndrome (Bilateral) (R>L); Chronic neck pain; Cervical spondylosis; Hx of cervical spine surgery; Cervical spinal fusion (C6-7 interbody fusion); Coronary atherosclerosis; Long term current use of opiate analgesic; Encounter for therapeutic drug level monitoring; Uncomplicated opioid dependence ([place]); Opiate use (56.25 MME/Day); CAD in native artery; Anxiety, generalized; Chronic sacroiliac joint pain (Bilateral) (L>R); Chronic lumbar radicular pain (Primary Source of Pain) (Left) (S1 Dermatome); Long term prescription opiate use; Encounter for chronic pain management; Trochanteric bursitis of right hip; Chronic hip pain (Bilateral) (L>R); Neurogenic pain; Myofascial pain; Osteoarthritis of hip (Left); Arthrodesis status; Degenerative disk disease; Myalgia; Trochanteric bursitis of hip (Bilateral) (L>R); Age-related nuclear cataract of left eye; Posterior subcapsular polar age-related cataract of left eye; Pseudophakia of right eye; Pseudophakia of left eye; Acute postoperative pain;  Spondylosis without myelopathy or radiculopathy, lumbosacral region; Spondylosis without myelopathy or radiculopathy, sacral and sacrococcygeal region; and Other specified dorsopathies, sacral and sacrococcygeal region on their problem list. Ms. Babin was last seen on 09/09/2017. Her primarily concern today is the Back Pain (burning and tenderness left and low post RFA)  Pain Assessment: Location: Lower, Left Back Onset: In the past 7 days(post procedure tenderness and burning at injection area) Duration: Chronic pain Quality: Burning, Tender Severity: 4 /10 (self-reported pain score)  Note: Reported level is compatible with observation.                          Timing: Constant Modifying factors: medications  Further details on both, my assessment(s), as well as the proposed treatment plan, please see below.  Laboratory Chemistry  Inflammation Markers (CRP: Acute Phase) (ESR: Chronic Phase) Lab Results  Component Value Date   CRP 4.8 12/05/2016   ESRSEDRATE 2 12/05/2016                         Rheumatology Markers No results found for: Elayne Guerin, [facility]                      Renal Function Markers Lab Results  Component Value Date   BUN 8 12/05/2016   CREATININE 0.88 12/05/2016   GFRAA 80 12/05/2016   GFRNONAA 70 12/05/2016                              Hepatic Function Markers Lab Results  Component Value Date   AST 19 12/05/2016   ALT 13 12/05/2016   ALBUMIN 3.7 12/05/2016   ALKPHOS 48 12/05/2016  Electrolytes Lab Results  Component Value Date   NA 140 12/05/2016   K 4.0 12/05/2016   CL 103 12/05/2016   CALCIUM 9.3 12/05/2016   MG 2.2 12/05/2016                        Neuropathy Markers Lab Results  Component Value Date   VITAMINB12 202 (L) 12/05/2016                        Bone Pathology Markers Lab Results  Component Value Date   25OHVITD1 50 12/05/2016   25OHVITD2 <1.0 12/05/2016   25OHVITD3  49 12/05/2016                         Coagulation Parameters Lab Results  Component Value Date   INR 1.0 03/02/2014   LABPROT 12.7 03/02/2014   PLT 197 01/26/2012                        Cardiovascular Markers Lab Results  Component Value Date   HGB 14.2 01/26/2012   HCT 41.1 01/26/2012                         CA Markers No results found for: CEA, CA125, LABCA2                      Note: Lab results reviewed.  Recent Diagnostic Imaging Review  Cervical Imaging: Cervical MR wo contrast:  Results for orders placed in visit on 09/28/98  MR Cervical Spine Wo Contrast   Narrative FINDINGS CLINICAL DATA:   CERVICAL HNP.  PAIN ON RIGHT SIDE OF NECK, LEFT POSTERIOR SCAPULA, AND RIGHT FOREARM. CERVICAL SPINE: VERTEBRAL BODY FORAMINA IS SATISFACTORY.  THE PATIENT HAS UNDERGONE ANTERIOR CERVICAL DISCECTOMY WITH INTERBODY FUSION AUGMENTED WITH ANTERIOR PLATE FIXATION.  OBLIQUE VIEWS SHOW NO SIGNIFICANT FORAMINAL NARROWING.  AP AND ODONTOID VIEWS ARE UNREMARKABLE.  FUSION APPEARS SOLID. IMPRESSION   Cervical MR wo contrast: No results found for this or any previous visit. Cervical MR w/wo contrast:  Results for orders placed during the hospital encounter of 03/03/12  MR Cervical Spine W Wo Contrast   Narrative *RADIOLOGY REPORT*  Clinical Data:  Headache.  Neck pain.  Previous neck surgery.  BUN and creatinine were obtained on site at [place] at [address] Results:  BUN 10 mg/dL,  Creatinine 1.0 mg/dL.  MRI HEAD WITHOUT AND WITH CONTRAST MRI CERVICAL SPINE WITHOUT AND WITH CONTRAST  Technique:  Multiplanar, multiecho pulse sequences of the brain and surrounding structures, and cervical spine, to include the craniocervical junction and cervicothoracic junction, were obtained without and with intravenous contrast.  Contrast: 35m MULTIHANCE GADOBENATE DIMEGLUMINE 529 MG/ML IV SOLN,  Comparison:  Radiography 02/10/2012.  CT 04/16/2011.   MRI 05/02/2009.  MRI HEAD  Findings:  The brain has a normal appearance without evidence of malformation, significant atrophy, hemorrhage, hydrocephalus or extra-axial collection.  No pituitary mass.  No inflammatory sinus disease.  No abnormal contrast enhancement.  No skull base lesion.  IMPRESSION: Normal examination.  No cause of headache identified.  MRI CERVICAL SPINE  Findings: The foramen magnum is widely patent.  C1-2 is normal.  At C2-3, the disc bulges minimally.  No stenosis of the canal or foramina.  I think there are mild facet degenerative changes on the right.  C3-4:  Spondylosis with endplate osteophytes and shallow protrusion of disc material.  Narrowing of the ventral subarachnoid space but no compression of the cord.  Mild facet degeneration bilaterally.  C4 through C7:  Previous fusion.  Anterior plate from E5-I7. Posterior fusion at C5-6.  Fusion appears grossly solid.  Apparent sufficient patency of the canal and foramina.  C7-T1:  Mild bulging of the disc.  Mild facet degeneration.  No stenosis.  Incidental hemangioma in the right to posterior body/pedicle of T1.  No abnormal cord signal.  IMPRESSION: Satisfactory appearance of the fusion segment from C4-C7.  C2-3:  Minimal disc bulge.  Facet degeneration right more than left.  No stenosis.  C3-4:  Endplate osteophytes and shallow protrusion of disc material.  Narrowing of the ventral subarachnoid space but no compression of the cord.  Mild facet degeneration right more than left.  No apparent compressive stenosis.  C7-T1:  Mild facet degeneration.  Mild bulging of the disc.  No compressive stenosis.  Since previous exams, degenerative changes at C2-3, C3-4 and C7-T1 have worsened minimally.   Original Report Authenticated By: Jules Schick, M.D.    Cervical MR w contrast: No results found for this or any previous visit. Cervical CT wo contrast:  Results for orders placed in visit on  11/01/98  CT Cervical Spine Wo Contrast   Narrative FINDINGS CLINICAL DATA:   THE PATIENT HAS RIGHT SHOULDER PAIN, RIGHT HIP, AND LEFT ARM PAIN. MYELOGRAMS: A LUMBAR SPINAL INJECTION WAS PERFORMED BY DR. Vertell Limber WITH [facility].   LUMBAR MYELOGRAM WAS INITIALLY PERFORMED FOLLOWED BY A CERVICAL MYELOGRAM. LUMBAR: LUMBAR MYELOGRAM SHOWS FIVE LUMBAR TYPE VERTEBRAE.  ALIGNMENT IS ANATOMIC.   THE NERVE ROOTS SHOW NORMAL FILLING.  THERE IS NO EXTRADURAL DEFECTS. IMPRESSION NORMAL LUMBAR MYELOGRAM. CERVICAL: CONTRAST WAS SUBSEQUENTLY DUMPED INTO THE CERVICAL REGION.  WE EVALUATED THE THORACIC SPINE DURING THIS PROCEDURE.   WITHIN THE THORACIC SPINE, THE THECAL SAC HAS A NORMAL APPEARANCE.  THERE IS NO EXTRADURAL DEFECTS IDENTIFIED. CERVICAL MYELOGRAM IMAGES WERE SUBSEQUENTLY OBTAINED.   POST-SURGICAL CHANGES SECONDARY TO INTERBODY FUSION AT C6-7 ARE DEMONSTRATED.  THE PATIENT WAS FUSED WITH ANTERIOR SCREW-PLATE FIXATION AND INTERBODY PLUG.   THERE IS NO EXTRADURAL DEFECT AT THE LEVEL OF THE FUSION.  THE CERVICAL MYELOGRAM HAS A NORMAL APPEARANCE WITHOUT NERVE ROOT CUT-OFF. IMPRESSION NORMAL CERVICAL MYELOGRAM. POST-MYELOGRAM CT - LUMBAR SPINE: CT SCAN OF THE LUMBAR SPINE IS PERFORMED FROM L2 TO S1 WITH CONTIGUOUS AXIAL SECTIONS. L2-3:     NORMAL INTERSPACE. L3-4:     NORMAL INTERSPACE. L4-5:     NORMAL INTERSPACE. L5-S1:   NORMAL INTERSPACE. INCIDENTAL NOTE IS MADE OF INCOMPLETE FUSION OF THE POSTERIOR ELEMENTS AT L5. IMPRESSION NORMAL LUMBAR POST-MYELOGRAM CT. POST-MYELOGRAM CT - CERVICAL SPINE: CONTIGUOUS AXIAL SECTIONS EXTEND INTO THE CERVICAL SPINE. C2-3:    NORMAL INTERSPACE. C3-4:    NORMAL INTERSPACE. C4-5:    NORMAL INTERSPACE. C5-6:    NORMAL INTERSPACE. C6-7:    POST-SURGICAL CHANGE IS DEMONSTRATED.   THE BONE PLUG AND HARDWARE APPEAR WELL POSITIONED.  THERE IS NO FORAMINAL STENOSIS.  THERE IS NO MASS EFFECT UPON THE SUBARACHNOID SPACE. C7-T1: NORMAL  INTERSPACE. IMPRESSION FUSION IN CERVICAL SPINE HAS A NORMAL APPEARANCE.   THE EXAMINATION IS OTHERWISE UNREMARKABLE.    Cervical CT w contrast:  Results for orders placed during the hospital encounter of 04/16/11  CT Cervical Spine W Contrast   Narrative *RADIOLOGY REPORT*  Clinical Data:  Neck pain.  History of cervical fusion  CT MYELOGRAPHY CERVICAL  SPINE  Technique:  CT imaging of the cervical spine was performed after intrathecal contrast administration. Multiplanar CT image reconstructions were also generated.  Comparison:  Cervical MRI 05/02/2009  Findings:  ACDF C4-5, C5-6 and  C6-7.  Posterior screw and rod fusion at C5-6 bilaterally.  Normal cervical alignment.  Negative for fracture.  Cervical spinal cord has normal morphology.  C2-3:  Negative  C3-4:  Small central disc protrusion without cord deformity or stenosis.  Foramina are patent.  C4-5:  ACDF.  Solid interbody bony fusion.  No significant stenosis.  C5-6:  ACDF.  Posterior hardware fusion.  No significant stenosis. Solid interbody bone fusion.  C6-7:  Interbody bone graft appears well incorporated.  Anterior plate has been removed from this level.  No significant stenosis. Posterior bone graft material is present.  No cord deformity.  C7-T1:  Negative  IMPRESSION: Small central disc protrusion at C3-4 without spinal stenosis.  Solid fusion at C4-5, C5-6, and C6-7 without significant spinal stenosis.  Original Report Authenticated By: Truett Perna, M.D.    Cervical DG 1 view:  Results for orders placed during the hospital encounter of 06/08/09  DG Cervical Spine 1 View   Narrative Clinical Data: Cervical spondylosis   CERVICAL SPINE - 1 VIEW   Comparison: 09/01/2008   Findings: A single lateral intraoperative fluoroscopic spot image documents again changes of anterior cervical discectomy and fusion C4-C6.  There has been hardware placed in the posterior elements of C5-C6.  The  cervicothoracic junction is not visualized.   IMPRESSION: Instrumented   posterior cervical fusion C5-6.  Provider: Tyson Dense   Cervical DG complete: s visi Results for orders placed in visit on 09/28/98  DG Cervical Spine Complete   Narrative FINDINGS CLINICAL DATA:   CERVICAL HNP.  PAIN ON RIGHT SIDE OF NECK, LEFT POSTERIOR SCAPULA, AND RIGHT FOREARM. CERVICAL SPINE: VERTEBRAL BODY FORAMINA IS SATISFACTORY.  THE PATIENT HAS UNDERGONE ANTERIOR CERVICAL DISCECTOMY WITH INTERBODY FUSION AUGMENTED WITH ANTERIOR PLATE FIXATION.  OBLIQUE VIEWS SHOW NO SIGNIFICANT FORAMINAL NARROWING.  AP AND ODONTOID VIEWS ARE UNREMARKABLE.  FUSION APPEARS SOLID. IMPRESSION   Cervical DG Myelogram views:  Results for orders placed in visit on 11/01/98  DG Myelogram Cervical   Narrative FINDINGS CLINICAL DATA:   THE PATIENT HAS RIGHT SHOULDER PAIN, RIGHT HIP, AND LEFT ARM PAIN. MYELOGRAMS: A LUMBAR SPINAL INJECTION WAS PERFORMED BY DR. Vertell Limber WITH [facility].   LUMBAR MYELOGRAM WAS INITIALLY PERFORMED FOLLOWED BY A CERVICAL MYELOGRAM. LUMBAR: LUMBAR MYELOGRAM SHOWS FIVE LUMBAR TYPE VERTEBRAE.  ALIGNMENT IS ANATOMIC.   THE NERVE ROOTS SHOW NORMAL FILLING.  THERE IS NO EXTRADURAL DEFECTS. IMPRESSION NORMAL LUMBAR MYELOGRAM. CERVICAL: CONTRAST WAS SUBSEQUENTLY DUMPED INTO THE CERVICAL REGION.  WE EVALUATED THE THORACIC SPINE DURING THIS PROCEDURE.   WITHIN THE THORACIC SPINE, THE THECAL SAC HAS A NORMAL APPEARANCE.  THERE IS NO EXTRADURAL DEFECTS IDENTIFIED. CERVICAL MYELOGRAM IMAGES WERE SUBSEQUENTLY OBTAINED.   POST-SURGICAL CHANGES SECONDARY TO INTERBODY FUSION AT C6-7 ARE DEMONSTRATED.  THE PATIENT WAS FUSED WITH ANTERIOR SCREW-PLATE FIXATION AND INTERBODY PLUG.   THERE IS NO EXTRADURAL DEFECT AT THE LEVEL OF THE FUSION.  THE CERVICAL MYELOGRAM HAS A NORMAL APPEARANCE WITHOUT NERVE ROOT CUT-OFF. IMPRESSION NORMAL CERVICAL MYELOGRAM. POST-MYELOGRAM CT - LUMBAR SPINE: CT SCAN OF  THE LUMBAR SPINE IS PERFORMED FROM L2 TO S1 WITH CONTIGUOUS AXIAL SECTIONS. L2-3:     NORMAL INTERSPACE. L3-4:     NORMAL INTERSPACE. L4-5:     NORMAL INTERSPACE. L5-S1:   NORMAL  INTERSPACE. INCIDENTAL NOTE IS MADE OF INCOMPLETE FUSION OF THE POSTERIOR ELEMENTS AT L5. IMPRESSION NORMAL LUMBAR POST-MYELOGRAM CT. POST-MYELOGRAM CT - CERVICAL SPINE: CONTIGUOUS AXIAL SECTIONS EXTEND INTO THE CERVICAL SPINE. C2-3:    NORMAL INTERSPACE. C3-4:    NORMAL INTERSPACE. C4-5:    NORMAL INTERSPACE. C5-6:    NORMAL INTERSPACE. C6-7:    POST-SURGICAL CHANGE IS DEMONSTRATED.   THE BONE PLUG AND HARDWARE APPEAR WELL POSITIONED.  THERE IS NO FORAMINAL STENOSIS.  THERE IS NO MASS EFFECT UPON THE SUBARACHNOID SPACE. C7-T1: NORMAL INTERSPACE. IMPRESSION FUSION IN CERVICAL SPINE HAS A NORMAL APPEARANCE.   THE EXAMINATION IS OTHERWISE UNREMARKABLE.  Lumbosacral Imaging: Lumbar MR wo contrast:  Results for orders placed during the hospital encounter of 02/27/16  MR Lumbar Spine Wo Contrast   Narrative CLINICAL DATA:  No SX; Injured 5 weeks ago lifting a mattress; c/o LBP with life side buttock, hip, and leg burning  EXAM: MRI LUMBAR SPINE WITHOUT CONTRAST  TECHNIQUE: Multiplanar, multisequence MR imaging of the lumbar spine was performed. No intravenous contrast was administered.  COMPARISON:  09/18/2015  FINDINGS: Segmentation:  Lowest open interspace assigned L5-S1, as before.  Alignment:  Normal  Vertebrae:  Normal signal.  No fracture.  Conus medullaris: Extends to the L1 level and appears normal.  Paraspinal and other soft tissues: Unremarkable  Disc levels:  T12-L1:  Unremarkable  L1-2:  Unremarkable  L2-3:  Minimal bulge.  Central canal and foramina patent.  L3-4: Mild circumferential bulge. Central canal and foramina patent.  L4-5: Minimal bulge. Early bilateral facet DJD. Central canal and foramina patent.  L5-S1: Small central bulge. Mild bilateral facet DJD. Central  canal and foramina patent.  IMPRESSION: 1. Negative for fracture or other acute abnormality. 2. Mild disc and facet disease L2-S1 as enumerated above, stable since prior exam. No compressive pathology.   Electronically Signed   By: Lucrezia Europe M.D.   On: 02/27/2016 10:32    Lumbar MR wo contrast: No results found for this or any previous visit. Lumbar MR w/wo contrast: No results found for this or any previous visit. Lumbar MR w contrast: No results found for this or any previous visit. Lumbar CT wo contrast:  Results for orders placed in visit on 11/01/98  CT Lumbar Spine Wo Contrast   Narrative FINDINGS CLINICAL DATA:   THE PATIENT HAS RIGHT SHOULDER PAIN, RIGHT HIP, AND LEFT ARM PAIN. MYELOGRAMS: A LUMBAR SPINAL INJECTION WAS PERFORMED BY DR. Vertell Limber WITH [facility].   LUMBAR MYELOGRAM WAS INITIALLY PERFORMED FOLLOWED BY A CERVICAL MYELOGRAM. LUMBAR: LUMBAR MYELOGRAM SHOWS FIVE LUMBAR TYPE VERTEBRAE.  ALIGNMENT IS ANATOMIC.   THE NERVE ROOTS SHOW NORMAL FILLING.  THERE IS NO EXTRADURAL DEFECTS. IMPRESSION NORMAL LUMBAR MYELOGRAM. CERVICAL: CONTRAST WAS SUBSEQUENTLY DUMPED INTO THE CERVICAL REGION.  WE EVALUATED THE THORACIC SPINE DURING THIS PROCEDURE.   WITHIN THE THORACIC SPINE, THE THECAL SAC HAS A NORMAL APPEARANCE.  THERE IS NO EXTRADURAL DEFECTS IDENTIFIED. CERVICAL MYELOGRAM IMAGES WERE SUBSEQUENTLY OBTAINED.   POST-SURGICAL CHANGES SECONDARY TO INTERBODY FUSION AT C6-7 ARE DEMONSTRATED.  THE PATIENT WAS FUSED WITH ANTERIOR SCREW-PLATE FIXATION AND INTERBODY PLUG.   THERE IS NO EXTRADURAL DEFECT AT THE LEVEL OF THE FUSION.  THE CERVICAL MYELOGRAM HAS A NORMAL APPEARANCE WITHOUT NERVE ROOT CUT-OFF. IMPRESSION NORMAL CERVICAL MYELOGRAM. POST-MYELOGRAM CT - LUMBAR SPINE: CT SCAN OF THE LUMBAR SPINE IS PERFORMED FROM L2 TO S1 WITH CONTIGUOUS AXIAL SECTIONS. L2-3:     NORMAL INTERSPACE. L3-4:     NORMAL INTERSPACE. L4-5:  NORMAL INTERSPACE. L5-S1:   NORMAL  INTERSPACE. INCIDENTAL NOTE IS MADE OF INCOMPLETE FUSION OF THE POSTERIOR ELEMENTS AT L5. IMPRESSION NORMAL LUMBAR POST-MYELOGRAM CT. POST-MYELOGRAM CT - CERVICAL SPINE: CONTIGUOUS AXIAL SECTIONS EXTEND INTO THE CERVICAL SPINE. C2-3:    NORMAL INTERSPACE. C3-4:    NORMAL INTERSPACE. C4-5:    NORMAL INTERSPACE. C5-6:    NORMAL INTERSPACE. C6-7:    POST-SURGICAL CHANGE IS DEMONSTRATED.   THE BONE PLUG AND HARDWARE APPEAR WELL POSITIONED.  THERE IS NO FORAMINAL STENOSIS.  THERE IS NO MASS EFFECT UPON THE SUBARACHNOID SPACE. C7-T1: NORMAL INTERSPACE. IMPRESSION FUSION IN CERVICAL SPINE HAS A NORMAL APPEARANCE.   THE EXAMINATION IS OTHERWISE UNREMARKABLE.   Lumbar CT w contrast:  Results for orders placed during the hospital encounter of 04/16/11  CT Lumbar Spine W Contrast   Narrative *RADIOLOGY REPORT*  Clinical Data:  Back pain  CT MYELOGRAPHY LUMBAR SPINE  Technique:  CT imaging of the lumbar spine was performed after intrathecal contrast administration.  Multiplanar CT image reconstructions were also generated.  Comparison:  Lumbar MRI 01/27/2010  Findings:  Normal lumbar alignment.  Negative for fracture or mass. Conus medullaris is normal and terminates at L1-2.  Atherosclerotic calcification in the aorta without aneurysm.  L1-2:  Negative  L2-3:  Negative  L3-4:  Mild disc bulging without stenosis or disc protrusion.  L4-5:  Mild disc degeneration and disc bulging.  Mild facet degeneration.  Mild narrowing of the canal without significant spinal stenosis.  L5-S1:  Mild disc degeneration and mild facet degeneration without spinal stenosis or neural impingement.  IMPRESSION: Mild lumbar degenerative changes.  Negative for disc protrusion or spinal stenosis.  Original Report Authenticated By: Truett Perna, M.D.   Lumbar DG (Complete) 4+V:  Results for orders placed in visit on 05/26/98  DG Lumbar Spine Complete   Narrative FINDINGS CLINICAL DATA:  RIGHT LOW  BACK PAIN.  PAIN IN BUTTOCKS, RIGHT HIP AND RIGHT LOWER EXTREMITY.  LEFT- SIDED NECK PAIN.  PAIN IN LEFT SHOULDER AND LEFT UPPER EXTREMITY. PLAIN FILM SERIES OF THE CERVICAL SPINE: OPEN MOUTH ODONTOID, AP, LATERAL AND OBLIQUE VIEWS WERE OBTAINED. THERE IS MILD VERTEBRAL BODY OSTEOPHYTIC FORMATION AT C4-5, C5-6 AND C6-7.  THERE IS NO SIGNIFICANT DISC SPACE NARROWING. THERE IS NO SUBLUXATION. THE BONY NEURAL FORAMINA APPEAR WIDELY PATENT. SAGITTAL DIMENSION OF THE CENTRAL CANAL ON THE LATERAL VIEW APPEARS ADEQUATE. MRI OF THE CERVICAL SPINE: MULTIPLANAR AND MULTISEQUENCE IMAGES WERE ACQUIRED ACCORDING TO THE STANDARD PROTOCOL. THE CRANIOVERTEBRAL JUNCTION APPEARS NORMAL. THE SUPERIOR CERVICAL CORD HAS A NORMAL CONTOUR AND SIGNAL INTENSITY. THERE IS MILD POSTERIOR ANNULAR BULGING AT C4-5 AND AT C5-6. THERE IS POSTERIOR ANNULAR BULGING AT C6-7, CENTRALLY AND TO THE RIGHT AND A DISC HERNIATION PARACENTRALLY ON THE LEFT AT C6-7. THIS EXTENDS TOWARDS THE NEURAL FORAMEN BUT DOES NOT APPEAR TO EXTEND FRANKLY INTO THE FORAMEN. THERE IS MILD BONY FORAMINAL STENOSIS. THE C7-T1 LEVEL APPEARS UNREMARKABLE. IMPRESSION   Lumbar DG Myelogram views:  Results for orders placed in visit on 11/01/98  DG Myelogram Lumbar   Narrative FINDINGS CLINICAL DATA:   THE PATIENT HAS RIGHT SHOULDER PAIN, RIGHT HIP, AND LEFT ARM PAIN. MYELOGRAMS: A LUMBAR SPINAL INJECTION WAS PERFORMED BY DR. Vertell Limber WITH [facility].   LUMBAR MYELOGRAM WAS INITIALLY PERFORMED FOLLOWED BY A CERVICAL MYELOGRAM. LUMBAR: LUMBAR MYELOGRAM SHOWS FIVE LUMBAR TYPE VERTEBRAE.  ALIGNMENT IS ANATOMIC.   THE NERVE ROOTS SHOW NORMAL FILLING.  THERE IS NO EXTRADURAL DEFECTS. IMPRESSION NORMAL LUMBAR MYELOGRAM. CERVICAL: CONTRAST WAS SUBSEQUENTLY  DUMPED [place].  WE EVALUATED THE THORACIC SPINE DURING THIS PROCEDURE.   WITHIN THE THORACIC SPINE, THE THECAL SAC HAS A NORMAL APPEARANCE.  THERE IS NO EXTRADURAL DEFECTS  IDENTIFIED. CERVICAL MYELOGRAM IMAGES WERE SUBSEQUENTLY OBTAINED.   POST-SURGICAL CHANGES SECONDARY TO INTERBODY FUSION AT C6-7 ARE DEMONSTRATED.  THE PATIENT WAS FUSED WITH ANTERIOR SCREW-PLATE FIXATION AND INTERBODY PLUG.   THERE IS NO EXTRADURAL DEFECT AT THE LEVEL OF THE FUSION.  THE CERVICAL MYELOGRAM HAS A NORMAL APPEARANCE WITHOUT NERVE ROOT CUT-OFF. IMPRESSION NORMAL CERVICAL MYELOGRAM. POST-MYELOGRAM CT - LUMBAR SPINE: CT SCAN OF THE LUMBAR SPINE IS PERFORMED FROM L2 TO S1 WITH CONTIGUOUS AXIAL SECTIONS. L2-3:     NORMAL INTERSPACE. L3-4:     NORMAL INTERSPACE. L4-5:     NORMAL INTERSPACE. L5-S1:   NORMAL INTERSPACE. INCIDENTAL NOTE IS MADE OF INCOMPLETE FUSION OF THE POSTERIOR ELEMENTS AT L5. IMPRESSION NORMAL LUMBAR POST-MYELOGRAM CT. POST-MYELOGRAM CT - CERVICAL SPINE: CONTIGUOUS AXIAL SECTIONS EXTEND INTO THE CERVICAL SPINE. C2-3:    NORMAL INTERSPACE. C3-4:    NORMAL INTERSPACE. C4-5:    NORMAL INTERSPACE. C5-6:    NORMAL INTERSPACE. C6-7:    POST-SURGICAL CHANGE IS DEMONSTRATED.   THE BONE PLUG AND HARDWARE APPEAR WELL POSITIONED.  THERE IS NO FORAMINAL STENOSIS.  THERE IS NO MASS EFFECT UPON THE SUBARACHNOID SPACE. C7-T1: NORMAL INTERSPACE. IMPRESSION FUSION IN CERVICAL SPINE HAS A NORMAL APPEARANCE.   THE EXAMINATION IS OTHERWISE UNREMARKABLE.    Complexity Note: Imaging results reviewed. Results shared with Ms. Barba, using Layman's terms.                         Meds   Current Outpatient Medications:  .  acetaminophen (TYLENOL) 325 MG tablet, Take by mouth., Disp: , Rfl:  .  alendronate (FOSAMAX) 35 MG tablet, Take 35 mg by mouth every 7 (seven) days. Take with a full glass of water on an empty stomach., Disp: , Rfl:  .  cholecalciferol (VITAMIN D) 400 UNITS TABS tablet, Take 1,000 Units by mouth daily., Disp: , Rfl:  .  cyclobenzaprine (FLEXERIL) 10 MG tablet, Take 1 tablet (10 mg total) by mouth 3 (three) times daily as needed for muscle spasms., Disp: 270  tablet, Rfl: 0 .  DULoxetine (CYMBALTA) 60 MG capsule, Take 60 mg by mouth daily. , Disp: , Rfl:  .  enalapril (VASOTEC) 5 MG tablet, Take 5 mg by mouth daily., Disp: , Rfl:  .  esomeprazole (NEXIUM) 20 MG capsule, Take 20 mg by mouth daily at 12 noon., Disp: , Rfl:  .  fenofibrate 54 MG tablet, Take by mouth., Disp: , Rfl:  .  fluticasone (FLONASE) 50 MCG/ACT nasal spray, 2 sprays nightly., Disp: , Rfl:  .  gabapentin (NEURONTIN) 300 MG capsule, Take 1 capsule (300 mg total) by mouth 2 (two) times daily., Disp: 180 capsule, Rfl: 0 .  Magnesium Oxide, Antacid, 500 MG CAPS, Take by mouth., Disp: , Rfl:  .  Melatonin 3 MG TABS, Take by mouth., Disp: , Rfl:  .  [START ON 11/21/2017] oxyCODONE-acetaminophen (PERCOCET) 7.5-325 MG tablet, Take 1 tablet by mouth 5 (five) times daily as needed for severe pain., Disp: 150 tablet, Rfl: 0 .  [START ON 10/22/2017] oxyCODONE-acetaminophen (PERCOCET) 7.5-325 MG tablet, Take 1 tablet by mouth 5 (five) times daily as needed for severe pain., Disp: 150 tablet, Rfl: 0 .  oxyCODONE-acetaminophen (PERCOCET) 7.5-325 MG tablet, Take 1 tablet by mouth 5 (five) times daily as  needed for severe pain., Disp: 150 tablet, Rfl: 0 .  rosuvastatin (CRESTOR) 5 MG tablet, Take 5 mg by mouth every other day. , Disp: , Rfl:  .  HYDROcodone-acetaminophen (NORCO/VICODIN) 5-325 MG tablet, Take 1 tablet by mouth every 6 (six) hours as needed for up to 7 days for moderate pain., Disp: 28 tablet, Rfl: 0  ROS  Constitutional: Denies any fever or chills Gastrointestinal: No reported hemesis, hematochezia, vomiting, or acute GI distress Musculoskeletal: Denies any acute onset joint swelling, redness, loss of ROM, or weakness Neurological: No reported episodes of acute onset apraxia, aphasia, dysarthria, agnosia, amnesia, paralysis, loss of coordination, or loss of consciousness  Allergies  Ms. Kernes has No Known Allergies.  [place]  Drug: Ms. Vey  reports that she does not use  drugs. Alcohol:  reports that she does not drink alcohol. Tobacco:  reports that she has quit smoking. She has never used smokeless tobacco. 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 Surgical: Ms. Sinor  has a past surgical history that includes Cholecystectomy; Abdominal hysterectomy; Neck surgery (Bilateral); Appendectomy; Neck surgery; Shoulder surgery; Colonoscopy with propofol (N/A, 07/19/2015); and caract surger. Family: family history includes Diabetes in her father and mother; Hypertension in her sister; Stroke in her mother.  Constitutional Exam  General appearance: Well nourished, well developed, and well hydrated. In no apparent acute distress Vitals:   09/25/17 1347  BP: 131/66  Pulse: 93  Resp: 18  Temp: 98.3 F (36.8 C)  TempSrc: Oral  SpO2: 99%  Weight: 147 lb (66.7 kg)  Height: 5' 0.5" (1.537 m)   BMI Assessment: Estimated body mass index is 28.24 kg/m as calculated from the following:   Height as of this encounter: 5' 0.5" (1.537 m).   Weight as of this encounter: 147 lb (66.7 kg). Psych/Mental status: Alert, oriented x 3 (person, place, & time)       Eyes: PERLA Respiratory: No evidence of acute respiratory distress  Cervical Spine Area Exam  Skin & Axial Inspection: No masses, redness, edema, swelling, or associated skin lesions Alignment: Symmetrical Functional ROM: Unrestricted ROM      Stability: No instability detected Muscle  Tone/Strength: Functionally intact. No obvious neuro-muscular anomalies detected. Sensory (Neurological): Unimpaired Palpation: No palpable anomalies              Upper Extremity (UE) Exam    Side: Right upper extremity  Side: Left upper extremity  Skin & Extremity Inspection: Skin color, temperature, and hair growth are WNL. No peripheral edema or cyanosis. No masses, redness, swelling, asymmetry, or associated skin lesions. No contractures.  Skin & Extremity Inspection: Skin color, temperature, and hair growth are WNL. No peripheral edema or cyanosis. No masses, redness, swelling, asymmetry, or associated skin lesions. No contractures.  Functional ROM: Unrestricted ROM          Functional ROM: Unrestricted ROM          Muscle Tone/Strength: Functionally intact. No obvious neuro-muscular anomalies detected.  Muscle Tone/Strength: Functionally intact. No obvious neuro-muscular anomalies detected.  Sensory (Neurological): Unimpaired          Sensory (Neurological): Unimpaired  Palpation: No palpable anomalies              Palpation: No palpable anomalies              Specialized Test(s): Deferred         Specialized Test(s): Deferred          Thoracic Spine Area Exam  Skin & Axial Inspection: No masses, redness, or swelling Alignment: Symmetrical Functional ROM: Unrestricted ROM Stability: No instability detected Muscle Tone/Strength: Functionally intact. No obvious neuro-muscular anomalies detected. Sensory (Neurological): Unimpaired Muscle strength & Tone: No palpable anomalies  Lumbar Spine Area Exam  Skin & Axial Inspection: No masses, redness, or swelling Alignment: Symmetrical Functional ROM: Unrestricted ROM      Stability: No instability detected Muscle Tone/Strength: Functionally intact. No obvious neuro-muscular anomalies detected. Sensory (Neurological): Unimpaired Palpation: Complains of area being tender to palpation       Provocative Tests: Lumbar Hyperextension  and rotation test: Unable to perform due to pain. Lumbar Lateral bending test: Unable to perform due to pain. Patrick's Maneuver: Unable to perform                    Gait & Posture Assessment  Ambulation: Unassisted Gait: Relatively normal for age and body habitus Posture: WNL   Lower Extremity Exam    Side: Right lower extremity  Side: Left lower extremity  Skin & Extremity Inspection: Skin color, temperature, and hair growth are WNL. No peripheral edema or cyanosis. No masses, redness, swelling, asymmetry, or associated skin lesions. No contractures.  Skin & Extremity Inspection: Skin color, temperature, and hair growth are WNL. No peripheral edema or cyanosis. No masses, redness, swelling, asymmetry, or associated skin lesions. No contractures.  Functional ROM: Unrestricted ROM          Functional ROM: Unrestricted ROM          Muscle Tone/Strength: Functionally intact. No obvious neuro-muscular anomalies detected.  Muscle Tone/Strength: Functionally intact. No obvious neuro-muscular anomalies detected.  Sensory (Neurological): Unimpaired  Sensory (Neurological): Unimpaired  Palpation: No palpable anomalies  Palpation: No palpable anomalies   Assessment  Primary Diagnosis & Pertinent Problem List: The primary encounter diagnosis was Acute postoperative pain. Diagnoses of Lumbar facet hypertrophy, Spondylosis without myelopathy or radiculopathy, sacral and sacrococcygeal region, and Chronic pain syndrome were also pertinent to this visit.  Status Diagnosis  Worsening Not responding Not responding 1. Acute postoperative pain   2. Lumbar facet hypertrophy   3. Spondylosis without myelopathy or radiculopathy, sacral and sacrococcygeal region   4. Chronic pain syndrome     Problems updated and reviewed during this visit: No problems updated. Plan of Care  Pharmacotherapy (Medications Ordered): Meds ordered this encounter  Medications  . HYDROcodone-acetaminophen (NORCO/VICODIN)  5-325 MG tablet    Sig: Take 1 tablet by mouth every 6 (six) hours as needed for up to 7 days for moderate pain.    Dispense:  28 tablet    Refill:  0    For acute post-operative pain. Not to be refilled. To last 7 days.    Order Specific Question:   Supervising Provider    Answer:   Milinda Pointer [[id number]]   New Prescriptions   No medications on file   Medications administered today: Korinne L. Esh had no medications administered during this visit. Lab-work, procedure(s), and/or referral(s): No orders of the defined types were placed in this encounter.  Imaging and/or referral(s): None  Provider-requested follow-up: Return for Appointment As Scheduled.  Future Appointments  Date Time Provider [place]  10/28/2017 11:00 AM Vevelyn Francois, NP [facility] None  12/10/2017  1:30 PM Vevelyn Francois, NP [facility] None   Primary Care Physician: Lavera Guise, MD Location: [facility] Note by: Vevelyn Francois NP Date: 09/25/2017; Time: 2:58 PM  Pain Score Disclaimer: We use the NRS-11 scale. This is a self-reported, subjective measurement of pain severity with only modest accuracy. It is used primarily to identify changes within a particular patient. It must be understood that outpatient pain scales are significantly less accurate that those used for research, where they can be applied under ideal controlled circumstances with minimal exposure to variables. In reality, the score is likely to be a combination of pain intensity and pain affect, where pain affect describes the degree of emotional arousal or changes in action readiness caused by the sensory experience of pain. Factors such as social and work situation, setting, emotional state, anxiety levels, expectation, and prior pain experience may influence pain perception and show large inter-individual differences that may also be affected by time variables.  Patient instructions provided during  this appointment: Patient Instructions  ____________________________________________________________________________________________  Medication Rules  Applies to: All patients receiving prescriptions (written or electronic).  Pharmacy of record: Pharmacy where electronic prescriptions will be sent. If written prescriptions are taken to a different pharmacy, please inform the nursing staff. The pharmacy listed in the electronic medical record should be the one where you would like electronic prescriptions to be sent.  Prescription refills: Only during scheduled appointments. Applies to both, written and electronic prescriptions.  NOTE: The following applies primarily to controlled substances (Opioid* Pain Medications).   Patient's responsibilities: 1. Pain Pills: Bring all pain pills to every appointment (except for procedure appointments). 2. Pill Bottles: Bring pills in original pharmacy bottle. Always bring newest bottle. Bring bottle, even if empty. 3. Medication refills: You are responsible for knowing and keeping track of what medications you need refilled. The day before your appointment, write a list of all prescriptions that need to be refilled. Bring that list to your appointment and give it to the admitting nurse. Prescriptions will be written only during appointments. If you forget a medication, it will not be "Called in", "Faxed", or "electronically sent". You will need to get another appointment to get these prescribed. 4. Prescription Accuracy: You are responsible for carefully inspecting your prescriptions before leaving our office. Have the discharge nurse carefully go over each prescription with you, before taking them home. Make sure that your name is accurately spelled, that your address is correct. Check the name and dose of your medication to make sure it is accurate. Check the number of pills, and the written instructions to make sure they are clear and accurate. Make sure  that you are given enough medication to last until your next medication refill appointment. 5. Taking Medication: Take medication as prescribed. Never take more pills than instructed. Never take medication more frequently than prescribed. Taking less pills or less frequently is permitted and encouraged, when it comes to controlled substances (written prescriptions).  6. Inform other Doctors: Always inform, all of your healthcare providers, of all the medications you take. 7. Pain Medication from other Providers: You are not allowed to accept any additional pain medication from any other Doctor or Healthcare provider. There are two exceptions to this rule. (see below) In the event that you require additional pain medication, you are responsible for notifying us, as stated below. 8. Medication Agreement: You  are responsible for carefully reading and following our Medication Agreement. This must be signed before receiving any prescriptions from our practice. Safely store a copy of your signed Agreement. Violations to the Agreement will result in no further prescriptions. (Additional copies of our Medication Agreement are available upon request.) 9. Laws, Rules, & Regulations: All patients are expected to follow all Federal and [employer], Rules, [employer]. Ignorance of the Laws does not constitute a valid excuse. The use of any illegal substances is prohibited. 10. Adopted CDC guidelines & recommendations: Target dosing levels will be at or below 60 MME/day. Use of benzodiazepines** is not recommended.  Exceptions: There are only two exceptions to the rule of not receiving pain medications from other Healthcare Providers. 1. Exception #1 (Emergencies): In the event of an emergency (i.e.: accident requiring emergency care), you are allowed to receive additional pain medication. However, you are responsible for: As soon as you are able, call our office [phone], at any time of the day or  night, and leave a message stating your name, the date and nature of the emergency, and the name and dose of the medication prescribed. In the event that your call is answered by a member of our staff, make sure to document and save the date, time, and the name of the person that took your information.  2. Exception #2 (Planned Surgery): In the event that you are scheduled by another doctor or dentist to have any type of surgery or procedure, you are allowed (for a period no longer than 30 days), to receive additional pain medication, for the acute post-op pain. However, in this case, you are responsible for picking up a copy of our "Post-op Pain Management for Surgeons" handout, and giving it to your surgeon or dentist. This document is available at our office, and does not require an appointment to obtain it. Simply go to our office during business hours (Monday-Thursday from 8:00 AM to 4:00 PM) (Friday 8:00 AM to 12:00 Noon) or if you have a scheduled appointment with [place], prior to your surgery, and ask for it by name. In addition, you will need to provide [place] with your name, name of your surgeon, type of surgery, and date of procedure or surgery.  *Opioid medications include: morphine, codeine, oxycodone, oxymorphone, hydrocodone, hydromorphone, meperidine, tramadol, tapentadol, buprenorphine, fentanyl, methadone. **Benzodiazepine medications include: diazepam (Valium), alprazolam (Xanax), clonazepam (Klonopine), lorazepam (Ativan), clorazepate (Tranxene), chlordiazepoxide (Librium), estazolam (Prosom), oxazepam (Serax), temazepam (Restoril), triazolam (Halcion) (Last updated: 08/28/2017) ____________________________________________________________________________________________

## 2017-09-25 NOTE — Patient Instructions (Signed)
____________________________________________________________________________________________  Medication Rules  Applies to: All patients receiving prescriptions (written or electronic).  Pharmacy of record: Pharmacy where electronic prescriptions will be sent. If written prescriptions are taken to a different pharmacy, please inform the nursing staff. The pharmacy listed in the electronic medical record should be the one where you would like electronic prescriptions to be sent.  Prescription refills: Only during scheduled appointments. Applies to both, written and electronic prescriptions.  NOTE: The following applies primarily to controlled substances (Opioid* Pain Medications).   Patient's responsibilities: 1. Pain Pills: Bring all pain pills to every appointment (except for procedure appointments). 2. Pill Bottles: Bring pills in original pharmacy bottle. Always bring newest bottle. Bring bottle, even if empty. 3. Medication refills: You are responsible for knowing and keeping track of what medications you need refilled. The day before your appointment, write a list of all prescriptions that need to be refilled. Bring that list to your appointment and give it to the admitting nurse. Prescriptions will be written only during appointments. If you forget a medication, it will not be "Called in", "Faxed", or "electronically sent". You will need to get another appointment to get these prescribed. 4. Prescription Accuracy: You are responsible for carefully inspecting your prescriptions before leaving our office. Have the discharge nurse carefully go over each prescription with you, before taking them home. Make sure that your name is accurately spelled, that your address is correct. Check the name and dose of your medication to make sure it is accurate. Check the number of pills, and the written instructions to make sure they are clear and accurate. Make sure that you are given enough medication to last  until your next medication refill appointment. 5. Taking Medication: Take medication as prescribed. Never take more pills than instructed. Never take medication more frequently than prescribed. Taking less pills or less frequently is permitted and encouraged, when it comes to controlled substances (written prescriptions).  6. Inform other Doctors: Always inform, all of your healthcare providers, of all the medications you take. 7. Pain Medication from other Providers: You are not allowed to accept any additional pain medication from any other Doctor or Healthcare provider. There are two exceptions to this rule. (see below) In the event that you require additional pain medication, you are responsible for notifying us, as stated below. 8. Medication Agreement: You are responsible for carefully reading and following our Medication Agreement. This must be signed before receiving any prescriptions from our practice. Safely store a copy of your signed Agreement. Violations to the Agreement will result in no further prescriptions. (Additional copies of our Medication Agreement are available upon request.) 9. Laws, Rules, & Regulations: All patients are expected to follow all Federal and State Laws, Statutes, Rules, & Regulations. Ignorance of the Laws does not constitute a valid excuse. The use of any illegal substances is prohibited. 10. Adopted CDC guidelines & recommendations: Target dosing levels will be at or below 60 MME/day. Use of benzodiazepines** is not recommended.  Exceptions: There are only two exceptions to the rule of not receiving pain medications from other Healthcare Providers. 1. Exception #1 (Emergencies): In the event of an emergency (i.e.: accident requiring emergency care), you are allowed to receive additional pain medication. However, you are responsible for: As soon as you are able, call our office [phone], at any time of the day or night, and leave a message stating your name, the  date and nature of the emergency, and the name and dose of the medication   prescribed. In the event that your call is answered by a member of our staff, make sure to document and save the date, time, and the name of the person that took your information.  2. Exception #2 (Planned Surgery): In the event that you are scheduled by another doctor or dentist to have any type of surgery or procedure, you are allowed (for a period no longer than 30 days), to receive additional pain medication, for the acute post-op pain. However, in this case, you are responsible for picking up a copy of our "Post-op Pain Management for Surgeons" handout, and giving it to your surgeon or dentist. This document is available at our office, and does not require an appointment to obtain it. Simply go to our office during business hours (Monday-Thursday from 8:00 AM to 4:00 PM) (Friday 8:00 AM to 12:00 Noon) or if you have a scheduled appointment with us, prior to your surgery, and ask for it by name. In addition, you will need to provide us with your name, name of your surgeon, type of surgery, and date of procedure or surgery.  *Opioid medications include: morphine, codeine, oxycodone, oxymorphone, hydrocodone, hydromorphone, meperidine, tramadol, tapentadol, buprenorphine, fentanyl, methadone. **Benzodiazepine medications include: diazepam (Valium), alprazolam (Xanax), clonazepam (Klonopine), lorazepam (Ativan), clorazepate (Tranxene), chlordiazepoxide (Librium), estazolam (Prosom), oxazepam (Serax), temazepam (Restoril), triazolam (Halcion) (Last updated: 08/28/2017) ____________________________________________________________________________________________    

## 2017-09-25 NOTE — Progress Notes (Signed)
Safety precautions to be maintained throughout the outpatient stay will include: orient to surroundings, keep bed in low position, maintain call bell within reach at all times, provide assistance with transfer out of bed and ambulation.  

## 2017-10-08 DIAGNOSIS — M810 Age-related osteoporosis without current pathological fracture: Secondary | ICD-10-CM | POA: Insufficient documentation

## 2017-10-28 ENCOUNTER — Ambulatory Visit: Payer: Medicare Other | Admitting: Nurse Practitioner

## 2017-11-05 ENCOUNTER — Other Ambulatory Visit: Payer: Self-pay

## 2017-11-05 ENCOUNTER — Ambulatory Visit: Payer: Medicare Other | Attending: Nurse Practitioner | Admitting: Nurse Practitioner

## 2017-11-05 ENCOUNTER — Encounter: Payer: Self-pay | Admitting: Nurse Practitioner

## 2017-11-05 VITALS — BP 123/61 | HR 61 | Temp 97.7°F | Resp 16 | Ht 60.0 in | Wt 150.0 lb

## 2017-11-05 DIAGNOSIS — I10 Essential (primary) hypertension: Secondary | ICD-10-CM | POA: Diagnosis not present

## 2017-11-05 DIAGNOSIS — M1610 Unilateral primary osteoarthritis, unspecified hip: Secondary | ICD-10-CM | POA: Insufficient documentation

## 2017-11-05 DIAGNOSIS — M7061 Trochanteric bursitis, right hip: Secondary | ICD-10-CM | POA: Insufficient documentation

## 2017-11-05 DIAGNOSIS — G894 Chronic pain syndrome: Secondary | ICD-10-CM | POA: Diagnosis not present

## 2017-11-05 DIAGNOSIS — M47816 Spondylosis without myelopathy or radiculopathy, lumbar region: Secondary | ICD-10-CM

## 2017-11-05 DIAGNOSIS — E785 Hyperlipidemia, unspecified: Secondary | ICD-10-CM | POA: Diagnosis not present

## 2017-11-05 DIAGNOSIS — G43909 Migraine, unspecified, not intractable, without status migrainosus: Secondary | ICD-10-CM | POA: Diagnosis not present

## 2017-11-05 DIAGNOSIS — H25812 Combined forms of age-related cataract, left eye: Secondary | ICD-10-CM | POA: Insufficient documentation

## 2017-11-05 DIAGNOSIS — I2510 Atherosclerotic heart disease of native coronary artery without angina pectoris: Secondary | ICD-10-CM | POA: Insufficient documentation

## 2017-11-05 DIAGNOSIS — G8929 Other chronic pain: Secondary | ICD-10-CM

## 2017-11-05 DIAGNOSIS — M7062 Trochanteric bursitis, left hip: Secondary | ICD-10-CM | POA: Insufficient documentation

## 2017-11-05 DIAGNOSIS — F1120 Opioid dependence, uncomplicated: Secondary | ICD-10-CM | POA: Insufficient documentation

## 2017-11-05 DIAGNOSIS — M47812 Spondylosis without myelopathy or radiculopathy, cervical region: Secondary | ICD-10-CM | POA: Diagnosis not present

## 2017-11-05 DIAGNOSIS — M7918 Myalgia, other site: Secondary | ICD-10-CM | POA: Diagnosis not present

## 2017-11-05 DIAGNOSIS — G8918 Other acute postprocedural pain: Secondary | ICD-10-CM | POA: Diagnosis not present

## 2017-11-05 DIAGNOSIS — Z961 Presence of intraocular lens: Secondary | ICD-10-CM | POA: Insufficient documentation

## 2017-11-05 DIAGNOSIS — Z981 Arthrodesis status: Secondary | ICD-10-CM | POA: Diagnosis not present

## 2017-11-05 DIAGNOSIS — Z79891 Long term (current) use of opiate analgesic: Secondary | ICD-10-CM | POA: Diagnosis not present

## 2017-11-05 DIAGNOSIS — M545 Low back pain: Secondary | ICD-10-CM | POA: Diagnosis present

## 2017-11-05 DIAGNOSIS — K219 Gastro-esophageal reflux disease without esophagitis: Secondary | ICD-10-CM | POA: Diagnosis not present

## 2017-11-05 DIAGNOSIS — M533 Sacrococcygeal disorders, not elsewhere classified: Secondary | ICD-10-CM | POA: Insufficient documentation

## 2017-11-05 DIAGNOSIS — Z79899 Other long term (current) drug therapy: Secondary | ICD-10-CM | POA: Diagnosis not present

## 2017-11-05 DIAGNOSIS — M810 Age-related osteoporosis without current pathological fracture: Secondary | ICD-10-CM | POA: Insufficient documentation

## 2017-11-05 NOTE — Progress Notes (Signed)
Patient's Name: Jacqueline Thornton  MRN: [id number]  Referring Provider: Lavera Guise, MD  DOB: [birth year]  PCP: Lavera Guise, MD  DOS: 11/05/2017  Note by: Vevelyn Francois NP  Service setting: Ambulatory outpatient  Specialty: Interventional Pain Management  Location: [facility] (AMB) Pain Management Facility    Patient type: Established    Primary Reason(s) for Visit: Encounter for prescription drug management & post-procedure evaluation of chronic illness with mild to moderate exacerbation(Level of risk: moderate) CC: Back Pain (lower bilaterally)  HPI  Jacqueline Thornton is a 67 y.o. year old, female patient, who comes today for a post-procedure evaluation and medication management. She has Chronic pain syndrome; Gout; Hyperlipidemia; Headache, migraine; Panic attack; Chronic low back pain (Secondary source of pain) (Bilateral) (midline to tailbone) (R>L); Spondylosis of lumbar spine; Lumbar annular disc tear (L4-5); Discogenic low back pain (L3-4 and L4-5); Lumbar facet hypertrophy; Lumbar facet syndrome (Bilateral) (R>L); Chronic neck pain; Cervical spondylosis; Hx of cervical spine surgery; Cervical spinal fusion (C6-7 interbody fusion); Coronary atherosclerosis; Long term current use of opiate analgesic; Encounter for therapeutic drug level monitoring; Uncomplicated opioid dependence ([place]); Opiate use (56.25 MME/Day); CAD in native artery; Anxiety, generalized; Chronic sacroiliac joint pain (Bilateral) (L>R); Chronic lumbar radicular pain (Primary Source of Pain) (Left) (S1 Dermatome); Long term prescription opiate use; Encounter for chronic pain management; Trochanteric bursitis of right hip; Chronic hip pain (Bilateral) (L>R); Neurogenic pain; Myofascial pain; Osteoarthritis of hip (Left); Arthrodesis status; Degenerative disk disease; Myalgia; Trochanteric bursitis of hip (Bilateral) (L>R); Age-related nuclear cataract of left eye; Posterior subcapsular polar age-related cataract of left eye; Pseudophakia of  right eye; Pseudophakia of left eye; Acute postoperative pain; Spondylosis without myelopathy or radiculopathy, lumbosacral region; Spondylosis without myelopathy or radiculopathy, sacral and sacrococcygeal region; Other specified dorsopathies, sacral and sacrococcygeal region; and Age-related osteoporosis without current pathological fracture on their problem list. Her primarily concern today is the Back Pain (lower bilaterally)  Pain Assessment: Location: Lower, Right, Left Back Radiating: radiates through left hip and buttock Onset: More than a month ago Duration: Chronic pain Quality: Burning, Numbness, Tender, Aching(deep aches are constant) Severity: 4 /10 (subjective, self-reported pain score)  Note: Reported level is compatible with observation.                          Effect on ADL: difficult to keep up with housekeeping  Timing: Constant Modifying factors: medications BP: 123/61  HR: 61  Jacqueline Thornton was last seen on 10/28/2017 for a procedure. During today's appointment we reviewed Ms. Gin's post-procedure results, as well as her outpatient medication regimen. She admits that she is having some numbness at the hip. She states that the left side she continues to have low back pian. sHe admits that hse also has hip pain that goes up in her back and down into her hip slightly. She admits that in the past it did wonderfully.   Further details on both, my assessment(s), as well as the proposed treatment plan, please see below.  Post-Procedure Assessment  3/192019 Procedure: Left lumbar facet RFA Pre-procedure pain score:  8/10 Post-procedure pain score: 0/10         Influential Factors: BMI: 29.29 kg/m Intra-procedural challenges: None observed.         Assessment challenges: None detected.              Reported side-effects: None.        Post-procedural adverse reactions or complications: None reported  Sedation: Please see nurses note. When no sedatives are used, the  analgesic levels obtained are directly associated to the effectiveness of the local anesthetics. However, when sedation is provided, the level of analgesia obtained during the initial 1 hour following the intervention, is believed to be the result of a combination of factors. These factors may include, but are not limited to: 1. The effectiveness of the local anesthetics used. 2. The effects of the analgesic(s) and/or anxiolytic(s) used. 3. The degree of discomfort experienced by the patient at the time of the procedure. 4. The patients ability and reliability in recalling and recording the events. 5. The presence and influence of possible secondary gains and/or psychosocial factors. Reported result: Relief experienced during the 1st hour after the procedure: 100 % (Ultra-Short Term Relief)            Interpretative annotation: Clinically appropriate result. Analgesia during this period is likely to be Local Anesthetic and/or IV Sedative (Analgesic/Anxiolytic) related.          Effects of local anesthetic: The analgesic effects attained during this period are directly associated to the localized infiltration of local anesthetics and therefore cary significant diagnostic value as to the etiological location, or anatomical origin, of the pain. Expected duration of relief is directly dependent on the pharmacodynamics of the local anesthetic used. Long-acting (4-6 hours) anesthetics used.  Reported result: Relief during the next 4 to 6 hour after the procedure: 80 % (Short-Term Relief)            Interpretative annotation: Clinically appropriate result. Analgesia during this period is likely to be Local Anesthetic-related.          Long-term benefit: Defined as the period of time past the expected duration of local anesthetics (1 hour for short-acting and 4-6 hours for long-acting). With the possible exception of prolonged sympathetic blockade from the local anesthetics, benefits during this period are  typically attributed to, or associated with, other factors such as analgesic sensory neuropraxia, antiinflammatory effects, or beneficial biochemical changes provided by agents other than the local anesthetics.  Reported result: Extended relief following procedure: 50 %(Pt states relief on right side is 100% better; left side is 50% better.) (Long-Term Relief)            Interpretative annotation: Clinically appropriate result. Good relief. No permanent benefit expected. Inflammation plays a part in the etiology to the pain.          Current benefits: Defined as reported results that persistent at this point in time.   Analgesia: 50 %            Function: Somewhat improved ROM: Somewhat improved Interpretative annotation: Recurrence of symptoms. No permanent benefit expected. Adequate RF ablation.          Interpretation: Results would suggest adequate radiofrequency ablation.                  Plan:  Please see "Plan of Care" for details.                Laboratory Chemistry  Inflammation Markers (CRP: Acute Phase) (ESR: Chronic Phase) Lab Results  Component Value Date   CRP 4.8 12/05/2016   ESRSEDRATE 2 12/05/2016                         Rheumatology Markers No results found for: RF, ANA, LABURIC, URICUR, LYMEIGGIGMAB, LYMEABIGMQN  Renal Function Markers Lab Results  Component Value Date   BUN 8 12/05/2016   CREATININE 0.88 12/05/2016   GFRAA 80 12/05/2016   GFRNONAA 70 12/05/2016                              Hepatic Function Markers Lab Results  Component Value Date   AST 19 12/05/2016   ALT 13 12/05/2016   ALBUMIN 3.7 12/05/2016   ALKPHOS 48 12/05/2016                        Electrolytes Lab Results  Component Value Date   NA 140 12/05/2016   K 4.0 12/05/2016   CL 103 12/05/2016   CALCIUM 9.3 12/05/2016   MG 2.2 12/05/2016                        Neuropathy Markers Lab Results  Component Value Date   VITAMINB12 202 (L) 12/05/2016                         Bone Pathology Markers Lab Results  Component Value Date   25OHVITD1 50 12/05/2016   25OHVITD2 <1.0 12/05/2016   25OHVITD3 49 12/05/2016                         Coagulation Parameters Lab Results  Component Value Date   INR 1.0 03/02/2014   LABPROT 12.7 03/02/2014   PLT 197 01/26/2012                        Cardiovascular Markers Lab Results  Component Value Date   HGB 14.2 01/26/2012   HCT 41.1 01/26/2012                         CA Markers No results found for: CEA, CA125, LABCA2                      Note: Lab results reviewed.  Recent Diagnostic Imaging Results  DG C-Arm 1-60 Min-No Report Fluoroscopy was utilized by the requesting physician.  No radiographic  interpretation.   Complexity Note: Imaging results reviewed. Results shared with Ms. Regal, using Layman's terms.                         Meds   Current Outpatient Medications:  .  acetaminophen (TYLENOL) 325 MG tablet, Take by mouth., Disp: , Rfl:  .  alendronate (FOSAMAX) 35 MG tablet, Take 35 mg by mouth every 7 (seven) days. Take with a full glass of water on an empty stomach., Disp: , Rfl:  .  cholecalciferol (VITAMIN D) 400 UNITS TABS tablet, Take 1,000 Units by mouth daily., Disp: , Rfl:  .  cyclobenzaprine (FLEXERIL) 10 MG tablet, Take 1 tablet (10 mg total) by mouth 3 (three) times daily as needed for muscle spasms., Disp: 270 tablet, Rfl: 0 .  DULoxetine (CYMBALTA) 60 MG capsule, Take 60 mg by mouth daily. , Disp: , Rfl:  .  enalapril (VASOTEC) 5 MG tablet, Take 5 mg by mouth daily., Disp: , Rfl:  .  esomeprazole (NEXIUM) 20 MG capsule, Take 20 mg by mouth daily at 12 noon., Disp: , Rfl:  .  fluticasone (FLONASE) 50 MCG/ACT nasal spray, 2 sprays  nightly., Disp: , Rfl:  .  gabapentin (NEURONTIN) 300 MG capsule, Take 1 capsule (300 mg total) by mouth 2 (two) times daily., Disp: 180 capsule, Rfl: 0 .  Magnesium Oxide, Antacid, 500 MG CAPS, Take by mouth., Disp: , Rfl:  .  Melatonin 3  MG TABS, Take by mouth., Disp: , Rfl:  .  [START ON 11/21/2017] oxyCODONE-acetaminophen (PERCOCET) 7.5-325 MG tablet, Take 1 tablet by mouth 5 (five) times daily as needed for severe pain., Disp: 150 tablet, Rfl: 0 .  oxyCODONE-acetaminophen (PERCOCET) 7.5-325 MG tablet, Take 1 tablet by mouth 5 (five) times daily as needed for severe pain., Disp: 150 tablet, Rfl: 0 .  rosuvastatin (CRESTOR) 5 MG tablet, Take 5 mg by mouth every other day. , Disp: , Rfl:  .  oxyCODONE-acetaminophen (PERCOCET) 7.5-325 MG tablet, Take 1 tablet by mouth 5 (five) times daily as needed for severe pain., Disp: 150 tablet, Rfl: 0  ROS  Constitutional: Denies any fever or chills Gastrointestinal: No reported hemesis, hematochezia, vomiting, or acute GI distress Musculoskeletal: Denies any acute onset joint swelling, redness, loss of ROM, or weakness Neurological: No reported episodes of acute onset apraxia, aphasia, dysarthria, agnosia, amnesia, paralysis, loss of coordination, or loss of consciousness  Allergies  Ms. Smoker has No Known Allergies.  [place]  Drug: Ms. Deskins  reports that she does not use drugs. Alcohol:  reports that she does not drink alcohol. Tobacco:  reports that she has quit smoking. She has never used smokeless tobacco. Medical:  has a past medical history of Absolute anemia (04/11/2015), Angina pectoris ([place]), Anxiety, Arthritis, Arthropathy of sacroiliac joint (04/11/2015), Asthma, Atypical face pain (04/24/2015), CAD (coronary artery disease), Chronic back pain, Depression, Fibromyalgia, GERD (gastroesophageal reflux disease), 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) (04/11/2015), Hyperlipidemia, Hypertension, Low back pain (04/06/2015), Lumbar radicular pain (04/18/2015), Migraine, Narrowing of intervertebral disc space (04/11/2015), Sacroiliac joint pain (04/11/2015), and Spine disorder. Surgical: Ms. Dorval  has a past surgical history that includes Cholecystectomy; Abdominal hysterectomy; Neck  surgery (Bilateral); Appendectomy; Neck surgery; Shoulder surgery; Colonoscopy with propofol (N/A, 07/19/2015); and caract surger. Family: family history includes Diabetes in her father and mother; Hypertension in her sister; Stroke in her mother.  Constitutional Exam  General appearance: Well nourished, well developed, and well hydrated. In no apparent acute distress Vitals:   11/05/17 1056  BP: 123/61  Pulse: 61  Resp: 16  Temp: 97.7 F (36.5 C)  TempSrc: Oral  SpO2: 100%  Weight: 150 lb (68 kg)  Height: 5' (1.524 m)  Psych/Mental status: Alert, oriented x 3 (person, place, & time)       Eyes: PERLA Respiratory: No evidence of acute respiratory distress   Lumbar Spine Area Exam  Skin & Axial Inspection: No masses, redness, or swelling Alignment: Symmetrical Functional ROM: Unrestricted ROM       Stability: No instability detected Muscle Tone/Strength: Functionally intact. No obvious neuro-muscular anomalies detected. Sensory (Neurological): Unimpaired Palpation: Complains of area being tender to palpation       Provocative Tests: Lumbar Hyperextension and rotation test: evaluation deferred today       Lumbar Lateral bending test: evaluation deferred today       Patrick's Maneuver: evaluation deferred today                    Gait & Posture Assessment  Ambulation: Unassisted Gait: Relatively normal for age and body habitus Posture: WNL   Lower Extremity Exam    Side: Right lower extremity  Side: Left lower  extremity  Stability: No instability observed          Stability: No instability observed          Skin & Extremity Inspection: Skin color, temperature, and hair growth are WNL. No peripheral edema or cyanosis. No masses, redness, swelling, asymmetry, or associated skin lesions. No contractures.  Skin & Extremity Inspection: Skin color, temperature, and hair growth are WNL. No peripheral edema or cyanosis. No masses, redness, swelling, asymmetry, or associated skin  lesions. No contractures.  Functional ROM: Unrestricted ROM                  Functional ROM: Guarding                  Muscle Tone/Strength: Functionally intact. No obvious neuro-muscular anomalies detected.  Muscle Tone/Strength: Functionally intact. No obvious neuro-muscular anomalies detected.  Sensory (Neurological): Unimpaired  Sensory (Neurological): Unimpaired  Palpation: No palpable anomalies  Palpation: Complains of area being tender to palpation   Assessment  Primary Diagnosis & Pertinent Problem List: The primary encounter diagnosis was Spondylosis of lumbar spine. Diagnoses of Lumbar facet syndrome (Bilateral) (R>L), Chronic sacroiliac joint pain (Bilateral) (L>R), and Chronic pain syndrome were also pertinent to this visit.  Status Diagnosis  Persistent Persistent Controlled 1. Spondylosis of lumbar spine   2. Lumbar facet syndrome (Bilateral) (R>L)   3. Chronic sacroiliac joint pain (Bilateral) (L>R)   4. Chronic pain syndrome     Problems updated and reviewed during this visit: Problem  Age-Related Osteoporosis Without Current Pathological Fracture   Plan of Care  Pharmacotherapy (Medications Ordered): No orders of the defined types were placed in this encounter.  New Prescriptions   No medications on file   Medications administered today: Brittanya L. Rod had no medications administered during this visit. Lab-work, procedure(s), and/or referral(s): No orders of the defined types were placed in this encounter.  Imaging and/or referral(s): None  Interventional therapies: Planned, scheduled, and/or pending: Declined trigger point injection to left. She will wait and call if needed.    Considering:  Diagnostic caudal epidural steroid injection + diagnostic epidurogram Palliative Right sided lumbar facet radiofrequency ablation + Left S-I Block Diagnostic cervical epidural steroid injection Diagnostic bilateral cervical facet block Possible bilateral  cervical facet radiofrequencyablation  Diagnostic bilateral lumbar facet block Possible bilateral lumbar facet radiofrequencyablation  Diagnostic left L4-5 lumbar epidural steroid injection  Diagnostic left S1 selective nerve root block Diagnostic bilateral sacroiliac joint block Possible bilateral sacroiliac joint radiofrequencyablation  Diagnostic right trochanteric bursa injection   Palliative PRN treatment(s):  Palliativecervical epidural steroid injection  Palliativebilateral cervical facet block Palliativebilateral lumbar facet block Palliativeleft L4-5 lumbar epiduralsteroid injection  Palliativeleft caudal epidural steroid injection  Palliativeleft S1 selective nerve root block Palliativebilateral sacroiliac joint block Palliativeright trochanteric bursa injec      Provider-requested follow-up: Return for Appointment As Scheduled.  Future Appointments  Date Time Provider [place]  12/10/2017  1:30 PM Vevelyn Francois, NP [facility] None   Primary Care Physician: Lavera Guise, MD Location: [facility] Note by: Vevelyn Francois NP Date: 11/05/2017; Time: 11:53 AM  Pain Score Disclaimer: We use the NRS-11 scale. This is a self-reported, subjective measurement of pain severity with only modest accuracy. It is used primarily to identify changes within a particular patient. It must be understood that outpatient pain scales are significantly less accurate that those used for research, where they can be applied under ideal controlled circumstances with minimal exposure to variables. In  reality, the score is likely to be a combination of pain intensity and pain affect, where pain affect describes the degree of emotional arousal or changes in action readiness caused by the sensory experience of pain. Factors such as social and work situation, setting, emotional state, anxiety levels, expectation, and prior pain experience may  influence pain perception and show large inter-individual differences that may also be affected by time variables.  Patient instructions provided during this appointment: Patient Instructions   ____________________________________________________________________________________________  Pain Scale  Introduction: The pain score used by this practice is the Verbal Numerical Rating Scale (VNRS-11). This is an 11-point scale. It is for adults and children 10 years or older. There are significant differences in how the pain score is reported, used, and applied. Forget everything you learned in the past and learn this scoring system.  General Information: The scale should reflect your current level of pain. Unless you are specifically asked for the level of your worst pain, or your average pain. If you are asked for one of these two, then it should be understood that it is over the past 24 hours.  Basic Activities of Daily Living (ADL): Personal hygiene, dressing, eating, transferring, and using restroom.  Instructions: Most patients tend to report their level of pain as a combination of two factors, their physical pain and their psychosocial pain. This last one is also known as "suffering" and it is reflection of how physical pain affects you socially and psychologically. From now on, report them separately. From this point on, when asked to report your pain level, report only your physical pain. Use the following table for reference.  Pain Clinic Pain Levels (0-5/10)  Pain Level Score  Description  No Pain 0   Mild pain 1 Nagging, annoying, but does not interfere with basic activities of daily living (ADL). Patients are able to eat, bathe, get dressed, toileting (being able to get on and off the toilet and perform personal hygiene functions), transfer (move in and out of bed or a chair without assistance), and maintain continence (able to control bladder and bowel functions). Blood pressure and heart  rate are unaffected. A normal heart rate for a healthy adult ranges from 60 to 100 bpm (beats per minute).   Mild to moderate pain 2 Noticeable and distracting. Impossible to hide from other people. More frequent flare-ups. Still possible to adapt and function close to normal. It can be very annoying and may have occasional stronger flare-ups. With discipline, patients may get used to it and adapt.   Moderate pain 3 Interferes significantly with activities of daily living (ADL). It becomes difficult to feed, bathe, get dressed, get on and off the toilet or to perform personal hygiene functions. Difficult to get in and out of bed or a chair without assistance. Very distracting. With effort, it can be ignored when deeply involved in activities.   Moderately severe pain 4 Impossible to ignore for more than a few minutes. With effort, patients may still be able to manage work or participate in some social activities. Very difficult to concentrate. Signs of autonomic nervous system discharge are evident: dilated pupils (mydriasis); mild sweating (diaphoresis); sleep interference. Heart rate becomes elevated (>115 bpm). Diastolic blood pressure (lower number) rises above 100 mmHg. Patients find relief in laying down and not moving.   Severe pain 5 Intense and extremely unpleasant. Associated with frowning face and frequent crying. Pain overwhelms the senses.  Ability to do any activity or maintain social relationships becomes significantly  limited. Conversation becomes difficult. Pacing back and forth is common, as getting into a comfortable position is nearly impossible. Pain wakes you up from deep sleep. Physical signs will be obvious: pupillary dilation; increased sweating; goosebumps; brisk reflexes; cold, clammy hands and feet; nausea, vomiting or dry heaves; loss of appetite; significant sleep disturbance with inability to fall asleep or to remain asleep. When persistent, significant weight loss is observed  due to the complete loss of appetite and sleep deprivation.  Blood pressure and heart rate becomes significantly elevated. Caution: If elevated blood pressure triggers a pounding headache associated with blurred vision, then the patient should immediately seek attention at an urgent or emergency care unit, as these may be signs of an impending stroke.    Emergency Department Pain Levels (6-10/10)  Emergency Room Pain 6 Severely limiting. Requires emergency care and should not be seen or managed at an outpatient pain management facility. Communication becomes difficult and requires great effort. Assistance to reach the emergency department may be required. Facial flushing and profuse sweating along with potentially dangerous increases in heart rate and blood pressure will be evident.   Distressing pain 7 Self-care is very difficult. Assistance is required to transport, or use restroom. Assistance to reach the emergency department will be required. Tasks requiring coordination, such as bathing and getting dressed become very difficult.   Disabling pain 8 Self-care is no longer possible. At this level, pain is disabling. The individual is unable to do even the most "basic" activities such as walking, eating, bathing, dressing, transferring to a bed, or toileting. Fine motor skills are lost. It is difficult to think clearly.   Incapacitating pain 9 Pain becomes incapacitating. Thought processing is no longer possible. Difficult to remember your own name. Control of movement and coordination are lost.   The worst pain imaginable 10 At this level, most patients pass out from pain. When this level is reached, collapse of the autonomic nervous system occurs, leading to a sudden drop in blood pressure and heart rate. This in turn results in a temporary and dramatic drop in blood flow to the brain, leading to a loss of consciousness. Fainting is one of the body's self defense mechanisms. Passing out puts the  brain in a calmed state and causes it to shut down for a while, in order to begin the healing process.    Summary: 1. Refer to this scale when providing [place] with your pain level. 2. Be accurate and careful when reporting your pain level. This will help with your care. 3. Over-reporting your pain level will lead to loss of credibility. 4. Even a level of 1/10 means that there is pain and will be treated at our facility. 5. High, inaccurate reporting will be documented as "Symptom Exaggeration", leading to loss of credibility and suspicions of possible secondary gains such as obtaining more narcotics, or wanting to appear disabled, for fraudulent reasons. 6. Only pain levels of 5 or below will be seen at our facility. 7. Pain levels of 6 and above will be sent to the Emergency Department and the appointment cancelled. ____________________________________________________________________________________________    BMI Assessment: Estimated body mass index is 29.29 kg/m as calculated from the following:   Height as of this encounter: 5' (1.524 m).   Weight as of this encounter: 150 lb (68 kg).  BMI interpretation table: BMI level Category Range association with higher incidence of chronic pain  <18 kg/m2 Underweight   18.5-24.9 kg/m2 Ideal body weight   25-29.9 kg/m2 Overweight  Increased incidence by 20%  30-34.9 kg/m2 Obese (Class I) Increased incidence by 68%  35-39.9 kg/m2 Severe obesity (Class II) Increased incidence by 136%  >40 kg/m2 Extreme obesity (Class III) Increased incidence by 254%   Patient's current BMI Ideal Body weight  Body mass index is 29.29 kg/m. Ideal body weight: 45.5 kg (100 lb 4.9 oz) Adjusted ideal body weight: 54.5 kg (120 lb 3 oz)   BMI Readings from Last 4 Encounters:  11/05/17 29.29 kg/m  09/25/17 28.24 kg/m  09/16/17 29.10 kg/m  09/09/17 28.43 kg/m   Wt Readings from Last 4 Encounters:  11/05/17 150 lb (68 kg)  09/25/17 147 lb (66.7 kg)   09/16/17 149 lb (67.6 kg)  09/09/17 148 lb (67.1 kg)

## 2017-11-05 NOTE — Progress Notes (Signed)
Safety precautions to be maintained throughout the outpatient stay will include: orient to surroundings, keep bed in low position, maintain call bell within reach at all times, provide assistance with transfer out of bed and ambulation.  

## 2017-11-11 ENCOUNTER — Telehealth: Payer: Self-pay | Admitting: Pain Medicine

## 2017-11-11 NOTE — Telephone Encounter (Signed)
Patient brought in order for [employer], she is unable to sit that long. Would like to have paper filled out asking for release of jury duty. Please call patient when she can pick up paper

## 2017-11-11 NOTE — Telephone Encounter (Signed)
Patient informed per voicemail that she can pick up the form any time.

## 2017-12-10 ENCOUNTER — Ambulatory Visit (HOSPITAL_BASED_OUTPATIENT_CLINIC_OR_DEPARTMENT_OTHER): Payer: Medicare Other | Admitting: Nurse Practitioner

## 2017-12-10 ENCOUNTER — Ambulatory Visit
Admission: RE | Admit: 2017-12-10 | Discharge: 2017-12-10 | Disposition: A | Discharge status: Home / Self Care | Payer: Medicare Other | Source: Ambulatory Visit | Attending: Nurse Practitioner | Admitting: Nurse Practitioner

## 2017-12-10 ENCOUNTER — Other Ambulatory Visit: Payer: Self-pay

## 2017-12-10 ENCOUNTER — Encounter: Payer: Self-pay | Admitting: Nurse Practitioner

## 2017-12-10 VITALS — BP 116/75 | HR 86 | Temp 98.2°F | Resp 16 | Ht 60.5 in | Wt 149.0 lb

## 2017-12-10 DIAGNOSIS — Z961 Presence of intraocular lens: Secondary | ICD-10-CM | POA: Insufficient documentation

## 2017-12-10 DIAGNOSIS — F419 Anxiety disorder, unspecified: Secondary | ICD-10-CM | POA: Insufficient documentation

## 2017-12-10 DIAGNOSIS — I10 Essential (primary) hypertension: Secondary | ICD-10-CM | POA: Diagnosis not present

## 2017-12-10 DIAGNOSIS — G43909 Migraine, unspecified, not intractable, without status migrainosus: Secondary | ICD-10-CM | POA: Diagnosis not present

## 2017-12-10 DIAGNOSIS — G8929 Other chronic pain: Secondary | ICD-10-CM | POA: Diagnosis present

## 2017-12-10 DIAGNOSIS — M899 Disorder of bone, unspecified: Secondary | ICD-10-CM | POA: Insufficient documentation

## 2017-12-10 DIAGNOSIS — I2510 Atherosclerotic heart disease of native coronary artery without angina pectoris: Secondary | ICD-10-CM | POA: Insufficient documentation

## 2017-12-10 DIAGNOSIS — M5416 Radiculopathy, lumbar region: Secondary | ICD-10-CM | POA: Diagnosis not present

## 2017-12-10 DIAGNOSIS — E785 Hyperlipidemia, unspecified: Secondary | ICD-10-CM | POA: Insufficient documentation

## 2017-12-10 DIAGNOSIS — Z8249 Family history of ischemic heart disease and other diseases of the circulatory system: Secondary | ICD-10-CM | POA: Insufficient documentation

## 2017-12-10 DIAGNOSIS — J45909 Unspecified asthma, uncomplicated: Secondary | ICD-10-CM | POA: Diagnosis not present

## 2017-12-10 DIAGNOSIS — K219 Gastro-esophageal reflux disease without esophagitis: Secondary | ICD-10-CM | POA: Diagnosis not present

## 2017-12-10 DIAGNOSIS — Z981 Arthrodesis status: Secondary | ICD-10-CM | POA: Diagnosis not present

## 2017-12-10 DIAGNOSIS — M47816 Spondylosis without myelopathy or radiculopathy, lumbar region: Secondary | ICD-10-CM | POA: Diagnosis not present

## 2017-12-10 DIAGNOSIS — M47812 Spondylosis without myelopathy or radiculopathy, cervical region: Secondary | ICD-10-CM | POA: Insufficient documentation

## 2017-12-10 DIAGNOSIS — Z87891 Personal history of nicotine dependence: Secondary | ICD-10-CM | POA: Diagnosis not present

## 2017-12-10 DIAGNOSIS — M4322 Fusion of spine, cervical region: Secondary | ICD-10-CM | POA: Diagnosis not present

## 2017-12-10 DIAGNOSIS — M533 Sacrococcygeal disorders, not elsewhere classified: Secondary | ICD-10-CM

## 2017-12-10 DIAGNOSIS — Z9889 Other specified postprocedural states: Secondary | ICD-10-CM | POA: Insufficient documentation

## 2017-12-10 DIAGNOSIS — Z90710 Acquired absence of both cervix and uterus: Secondary | ICD-10-CM | POA: Insufficient documentation

## 2017-12-10 DIAGNOSIS — Z9049 Acquired absence of other specified parts of digestive tract: Secondary | ICD-10-CM | POA: Insufficient documentation

## 2017-12-10 DIAGNOSIS — G894 Chronic pain syndrome: Secondary | ICD-10-CM

## 2017-12-10 DIAGNOSIS — M797 Fibromyalgia: Secondary | ICD-10-CM | POA: Insufficient documentation

## 2017-12-10 DIAGNOSIS — G8918 Other acute postprocedural pain: Secondary | ICD-10-CM | POA: Diagnosis not present

## 2017-12-10 DIAGNOSIS — Z79891 Long term (current) use of opiate analgesic: Secondary | ICD-10-CM

## 2017-12-10 DIAGNOSIS — Z833 Family history of diabetes mellitus: Secondary | ICD-10-CM | POA: Insufficient documentation

## 2017-12-10 DIAGNOSIS — Z79899 Other long term (current) drug therapy: Secondary | ICD-10-CM | POA: Diagnosis not present

## 2017-12-10 DIAGNOSIS — D649 Anemia, unspecified: Secondary | ICD-10-CM | POA: Insufficient documentation

## 2017-12-10 DIAGNOSIS — M1610 Unilateral primary osteoarthritis, unspecified hip: Secondary | ICD-10-CM | POA: Diagnosis not present

## 2017-12-10 DIAGNOSIS — F329 Major depressive disorder, single episode, unspecified: Secondary | ICD-10-CM | POA: Diagnosis not present

## 2017-12-10 DIAGNOSIS — H25812 Combined forms of age-related cataract, left eye: Secondary | ICD-10-CM | POA: Insufficient documentation

## 2017-12-10 DIAGNOSIS — M7918 Myalgia, other site: Secondary | ICD-10-CM | POA: Diagnosis not present

## 2017-12-10 DIAGNOSIS — M792 Neuralgia and neuritis, unspecified: Secondary | ICD-10-CM

## 2017-12-10 DIAGNOSIS — Z823 Family history of stroke: Secondary | ICD-10-CM | POA: Insufficient documentation

## 2017-12-10 MED ORDER — OXYCODONE-ACETAMINOPHEN 7.5-325 MG PO TABS: 1.0000 | ORAL_TABLET | Freq: Every day | ORAL | 0 refills | Status: DC | PRN

## 2017-12-10 MED ORDER — CYCLOBENZAPRINE HCL 10 MG PO TABS: 10.0000 mg | ORAL_TABLET | Freq: Three times a day (TID) | ORAL | 0 refills | Status: DC | PRN

## 2017-12-10 MED ORDER — GABAPENTIN 300 MG PO CAPS: 300.0000 mg | ORAL_CAPSULE | Freq: Two times a day (BID) | ORAL | 0 refills | Status: DC

## 2017-12-10 NOTE — Progress Notes (Signed)
Nursing Pain Medication Assessment:  Safety precautions to be maintained throughout the outpatient stay will include: orient to surroundings, keep bed in low position, maintain call bell within reach at all times, provide assistance with transfer out of bed and ambulation.  Medication Inspection Compliance: Pill count conducted under aseptic conditions, in front of the patient. Neither the pills nor the bottle was removed from the patient's sight at any time. Once count was completed pills were immediately returned to the patient in their original bottle.  Medication: Oxycodone/APAP Pill/Patch Count: 50 of 150 pills remain Pill/Patch Appearance: Markings consistent with prescribed medication Bottle Appearance: Standard pharmacy container. Clearly labeled. Filled Date: 5 / 24 / 2019 Last Medication intake:  Today

## 2017-12-10 NOTE — Patient Instructions (Signed)
____________________________________________________________________________________________  Medication Rules  Applies to: All patients receiving prescriptions (written or electronic).  Pharmacy of record: Pharmacy where electronic prescriptions will be sent. If written prescriptions are taken to a different pharmacy, please inform the nursing staff. The pharmacy listed in the electronic medical record should be the one where you would like electronic prescriptions to be sent.  Prescription refills: Only during scheduled appointments. Applies to both, written and electronic prescriptions.  NOTE: The following applies primarily to controlled substances (Opioid* Pain Medications).   Patient's responsibilities: 1. Pain Pills: Bring all pain pills to every appointment (except for procedure appointments). 2. Pill Bottles: Bring pills in original pharmacy bottle. Always bring newest bottle. Bring bottle, even if empty. 3. Medication refills: You are responsible for knowing and keeping track of what medications you need refilled. The day before your appointment, write a list of all prescriptions that need to be refilled. Bring that list to your appointment and give it to the admitting nurse. Prescriptions will be written only during appointments. If you forget a medication, it will not be "Called in", "Faxed", or "electronically sent". You will need to get another appointment to get these prescribed. 4. Prescription Accuracy: You are responsible for carefully inspecting your prescriptions before leaving our office. Have the discharge nurse carefully go over each prescription with you, before taking them home. Make sure that your name is accurately spelled, that your address is correct. Check the name and dose of your medication to make sure it is accurate. Check the number of pills, and the written instructions to make sure they are clear and accurate. Make sure that you are given enough medication to last  until your next medication refill appointment. 5. Taking Medication: Take medication as prescribed. Never take more pills than instructed. Never take medication more frequently than prescribed. Taking less pills or less frequently is permitted and encouraged, when it comes to controlled substances (written prescriptions).  6. Inform other Doctors: Always inform, all of your healthcare providers, of all the medications you take. 7. Pain Medication from other Providers: You are not allowed to accept any additional pain medication from any other Doctor or Healthcare provider. There are two exceptions to this rule. (see below) In the event that you require additional pain medication, you are responsible for notifying us, as stated below. 8. Medication Agreement: You are responsible for carefully reading and following our Medication Agreement. This must be signed before receiving any prescriptions from our practice. Safely store a copy of your signed Agreement. Violations to the Agreement will result in no further prescriptions. (Additional copies of our Medication Agreement are available upon request.) 9. Laws, Rules, & Regulations: All patients are expected to follow all Federal and State Laws, Statutes, Rules, & Regulations. Ignorance of the Laws does not constitute a valid excuse. The use of any illegal substances is prohibited. 10. Adopted CDC guidelines & recommendations: Target dosing levels will be at or below 60 MME/day. Use of benzodiazepines** is not recommended.  Exceptions: There are only two exceptions to the rule of not receiving pain medications from other Healthcare Providers. 1. Exception #1 (Emergencies): In the event of an emergency (i.e.: accident requiring emergency care), you are allowed to receive additional pain medication. However, you are responsible for: As soon as you are able, call our office [phone], at any time of the day or night, and leave a message stating your name, the  date and nature of the emergency, and the name and dose of the medication   prescribed. In the event that your call is answered by a member of our staff, make sure to document and save the date, time, and the name of the person that took your information.  2. Exception #2 (Planned Surgery): In the event that you are scheduled by another doctor or dentist to have any type of surgery or procedure, you are allowed (for a period no longer than 30 days), to receive additional pain medication, for the acute post-op pain. However, in this case, you are responsible for picking up a copy of our "Post-op Pain Management for Surgeons" handout, and giving it to your surgeon or dentist. This document is available at our office, and does not require an appointment to obtain it. Simply go to our office during business hours (Monday-Thursday from 8:00 AM to 4:00 PM) (Friday 8:00 AM to 12:00 Noon) or if you have a scheduled appointment with us, prior to your surgery, and ask for it by name. In addition, you will need to provide us with your name, name of your surgeon, type of surgery, and date of procedure or surgery.  *Opioid medications include: morphine, codeine, oxycodone, oxymorphone, hydrocodone, hydromorphone, meperidine, tramadol, tapentadol, buprenorphine, fentanyl, methadone. **Benzodiazepine medications include: diazepam (Valium), alprazolam (Xanax), clonazepam (Klonopine), lorazepam (Ativan), clorazepate (Tranxene), chlordiazepoxide (Librium), estazolam (Prosom), oxazepam (Serax), temazepam (Restoril), triazolam (Halcion) (Last updated: 08/28/2017) ____________________________________________________________________________________________    

## 2017-12-10 NOTE — Progress Notes (Signed)
Patient's Name: Jacqueline Thornton  MRN: [id number]  Referring Provider: Lavera Guise, MD  DOB: July 16, 1950  PCP: Lavera Guise, MD  DOS: 12/10/2017  Note by: Vevelyn Francois NP  Service setting: Ambulatory outpatient  Specialty: Interventional Pain Management  Location: [facility] (AMB) Pain Management Facility    Patient type: Established    Primary Reason(s) for Visit: Encounter for prescription drug management. (Level of risk: moderate)  CC: Back Pain (lower)  HPI  [name] is a 67 y.o. year old, female patient, who comes today for a medication management evaluation. She has Chronic pain syndrome; Gout; Hyperlipidemia; Headache, migraine; Panic attack; Chronic low back pain (Secondary source of pain) (Bilateral) (midline to tailbone) (R>L); Spondylosis of lumbar spine; Lumbar annular disc tear (L4-5); Discogenic low back pain (L3-4 and L4-5); Lumbar facet hypertrophy; Lumbar facet syndrome (Bilateral) (R>L); Chronic neck pain; Cervical spondylosis; Hx of cervical spine surgery; Cervical spinal fusion (C6-7 interbody fusion); Coronary atherosclerosis; Long term current use of opiate analgesic; Encounter for therapeutic drug level monitoring; Uncomplicated opioid dependence ([place]); Opiate use (56.25 MME/Day); CAD in native artery; Anxiety, generalized; Chronic sacroiliac joint pain (Bilateral) (L>R); Chronic lumbar radicular pain (Primary Source of Pain) (Left) (S1 Dermatome); Long term prescription opiate use; Encounter for chronic pain management; Trochanteric bursitis of right hip; Chronic hip pain (Bilateral) (L>R); Neurogenic pain; Myofascial pain; Osteoarthritis of hip (Left); Arthrodesis status; Degenerative disk disease; Myalgia; Trochanteric bursitis of hip (Bilateral) (L>R); Age-related nuclear cataract of left eye; Posterior subcapsular polar age-related cataract of left eye; Pseudophakia of right eye; Pseudophakia of left eye; Acute postoperative pain; Spondylosis without myelopathy or  radiculopathy, lumbosacral region; Spondylosis without myelopathy or radiculopathy, sacral and sacrococcygeal region; Other specified dorsopathies, sacral and sacrococcygeal region; and Age-related osteoporosis without current pathological fracture on their problem list. Her primarily concern today is the Back Pain (lower)  Pain Assessment: Location: Lower, Right, Left Back Radiating: radiates through left hip and buttock to calf and left side of left foot Onset: More than a month ago Duration: Chronic pain Quality: Burning, Tender, Constant, Numbness, Aching Severity: 4 /10 (subjective, self-reported pain score)  Note: Reported level is compatible with observation.                          Effect on ADL: difficult to keep up with housekeeping; pain is getting worse since last visit Timing: Constant Modifying factors: medications BP: 116/75  HR: 86  [name] was last scheduled for an appointment on 11/05/2017 for medication management. During today's appointment we reviewed [name]'s chronic pain status, as well as her outpatient medication regimen. She admits that her left side continues to be painful. She admits that she did take a long ride to [place]. She denies any numbness or tingling. She states that she is having problems voiding. She admits that she has hesitation and dribbling with urination. She denies any bladder problems.   The patient  reports that she does not use drugs. Her body mass index is 28.62 kg/m.  Further details on both, my assessment(s), as well as the proposed treatment plan, please see below.  Controlled Substance Pharmacotherapy Assessment REMS (Risk Evaluation and Mitigation Strategy)  Analgesic:Oxycodone 7.5/325 one tablet 5 times a day. (56.25 mg/day) MME/day:56.25 mg/day     Rise Patience, RN  12/10/2017  1:47 PM  Sign at close encounter Nursing Pain Medication Assessment:  Safety precautions to be maintained throughout the outpatient stay will  include: orient to  surroundings, keep bed in low position, maintain call bell within reach at all times, provide assistance with transfer out of bed and ambulation.  Medication Inspection Compliance: Pill count conducted under aseptic conditions, in front of the patient. Neither the pills nor the bottle was removed from the patient's sight at any time. Once count was completed pills were immediately returned to the patient in their original bottle.  Medication: Oxycodone/APAP Pill/Patch Count: 50 of 150 pills remain Pill/Patch Appearance: Markings consistent with prescribed medication Bottle Appearance: Standard pharmacy container. Clearly labeled. Filled Date: 5 / 24 / 2019 Last Medication intake:  Today   Pharmacokinetics: Liberation and absorption (onset of action): WNL Distribution (time to peak effect): WNL Metabolism and excretion (duration of action): WNL         Pharmacodynamics: Desired effects: Analgesia: [name] reports >50% benefit. Functional ability: Patient reports that medication allows her to accomplish basic ADLs Clinically meaningful improvement in function (CMIF): Sustained CMIF goals met Perceived effectiveness: Described as relatively effective, allowing for increase in activities of daily living (ADL) Undesirable effects: Side-effects or Adverse reactions: None reported Monitoring: [place] PMP: Online review of the past 15-monthperiod conducted. Compliant with practice rules and regulations Last UDS on record: Summary  Date Value Ref Range Status  03/11/2017 FINAL  Final    Comment:    ==================================================================== TOXASSURE SELECT 13 (MW) ==================================================================== Test                             Result       Flag       Units Drug Present and Declared for Prescription Verification   Oxycodone                      3245         EXPECTED   ng/mg creat   Oxymorphone                     1353         EXPECTED   ng/mg creat   Noroxycodone                   2883         EXPECTED   ng/mg creat   Noroxymorphone                 185          EXPECTED   ng/mg creat    Sources of oxycodone are scheduled prescription medications.    Oxymorphone, noroxycodone, and noroxymorphone are expected    metabolites of oxycodone. Oxymorphone is also available as a    scheduled prescription medication. ==================================================================== Test                      Result    Flag   Units      Ref Range   Creatinine              47               mg/dL      >=20 ==================================================================== Declared Medications:  The flagging and interpretation on this report are based on the  following declared medications.  Unexpected results may arise from  inaccuracies in the declared medications.  **Note: The testing scope of this panel includes these medications:  Oxycodone (Percocet)  **Note: The testing scope of this panel does not include following  reported medications:  Acetaminophen (Percocet)  Acetaminophen (Tylenol)  Alendronate (Fosamax)  Bromfenac  Cyclobenzaprine (Flexeril)  Duloxetine (Cymbalta)  Enalapril (Vasotec)  Estradiol (Estrace)  Fenofibrate  Gabapentin (Neurontin)  Magnesium Oxide  Ofloxaxin  Omeprazole (Nexium)  Rosuvastatin (Crestor)  Topical  Vitamin D ==================================================================== For clinical consultation, please call [phone]. ====================================================================    UDS interpretation: Compliant          Medication Assessment Form: Reviewed. Patient indicates being compliant with therapy Treatment compliance: Compliant Risk Assessment Profile: Aberrant behavior: See prior evaluations. None observed or detected today Comorbid factors increasing risk of overdose: See prior notes. No additional risks detected  today Risk of substance use disorder (SUD): Low Opioid Risk Tool - 12/10/17 1344      Family History of Substance Abuse   Alcohol  Negative    Illegal Drugs  Negative    Rx Drugs  Negative      Personal History of Substance Abuse   Alcohol  Negative    Illegal Drugs  Negative    Rx Drugs  Negative      Age   Age between 27-45 years   No      History of Preadolescent Sexual Abuse   History of Preadolescent Sexual Abuse  Negative or Female      Psychological Disease   Psychological Disease  Negative    Depression  Negative      Total Score   Opioid Risk Tool Scoring  0    Opioid Risk Interpretation  Low Risk      ORT Scoring interpretation table:  Score <3 = Low Risk for SUD  Score between 4-7 = Moderate Risk for SUD  Score >8 = High Risk for Opioid Abuse   Risk Mitigation Strategies:  Patient Counseling: Covered Patient-Prescriber Agreement (PPA): Present and active  Notification to other healthcare providers: Done  Pharmacologic Plan: No change in therapy, at this time.             Laboratory Chemistry  Inflammation Markers (CRP: Acute Phase) (ESR: Chronic Phase) Lab Results  Component Value Date   CRP 4.8 12/05/2016   ESRSEDRATE 2 12/05/2016                         Rheumatology Markers No results found for: RF, ANA, LABURIC, URICUR, LYMEIGGIGMAB, LYMEABIGMQN, HLAB27                      Renal Function Markers Lab Results  Component Value Date   BUN 8 12/05/2016   CREATININE 0.88 12/05/2016   BCR 9 (L) 12/05/2016   GFRAA 80 12/05/2016   GFRNONAA 70 12/05/2016                             Hepatic Function Markers Lab Results  Component Value Date   AST 19 12/05/2016   ALT 13 12/05/2016   ALBUMIN 3.7 12/05/2016   ALKPHOS 48 12/05/2016                        Electrolytes Lab Results  Component Value Date   NA 140 12/05/2016   K 4.0 12/05/2016   CL 103 12/05/2016   CALCIUM 9.3 12/05/2016   MG 2.2 12/05/2016                        Neuropathy  Markers Lab Results  Component Value Date   VITAMINB12 202 (L) 12/05/2016                        Bone Pathology Markers Lab Results  Component Value Date   25OHVITD1 50 12/05/2016   25OHVITD2 <1.0 12/05/2016   25OHVITD3 49 12/05/2016                         Coagulation Parameters Lab Results  Component Value Date   INR 1.0 03/02/2014   LABPROT 12.7 03/02/2014   PLT 197 01/26/2012                        Cardiovascular Markers Lab Results  Component Value Date   HGB 14.2 01/26/2012   HCT 41.1 01/26/2012                         CA Markers No results found for: CEA, CA125, LABCA2                      Note: Lab results reviewed.  Recent Diagnostic Imaging Results  DG C-Arm 1-60 Min-No Report Fluoroscopy was utilized by the requesting physician.  No radiographic  interpretation.   Complexity Note: Imaging results reviewed. Results shared with Ms. Crum, using Layman's terms.                         Meds   Current Outpatient Medications:  .  acetaminophen (TYLENOL) 325 MG tablet, Take by mouth., Disp: , Rfl:  .  cholecalciferol (VITAMIN D) 400 UNITS TABS tablet, Take 1,000 Units by mouth daily., Disp: , Rfl:  .  cyclobenzaprine (FLEXERIL) 10 MG tablet, Take 1 tablet (10 mg total) by mouth 3 (three) times daily as needed for muscle spasms., Disp: 270 tablet, Rfl: 0 .  DULoxetine (CYMBALTA) 60 MG capsule, Take 60 mg by mouth daily. , Disp: , Rfl:  .  enalapril (VASOTEC) 5 MG tablet, Take 5 mg by mouth daily., Disp: , Rfl:  .  esomeprazole (NEXIUM) 20 MG capsule, Take 20 mg by mouth daily at 12 noon., Disp: , Rfl:  .  fluticasone (FLONASE) 50 MCG/ACT nasal spray, 2 sprays nightly., Disp: , Rfl:  .  gabapentin (NEURONTIN) 300 MG capsule, Take 1 capsule (300 mg total) by mouth 2 (two) times daily., Disp: 180 capsule, Rfl: 0 .  Magnesium Oxide, Antacid, 500 MG CAPS, Take by mouth., Disp: , Rfl:  .  Melatonin 3 MG TABS, Take by mouth., Disp: , Rfl:  .  [START ON 01/20/2018]  oxyCODONE-acetaminophen (PERCOCET) 7.5-325 MG tablet, Take 1 tablet by mouth 5 (five) times daily as needed for severe pain., Disp: 150 tablet, Rfl: 0 .  rosuvastatin (CRESTOR) 5 MG tablet, Take 5 mg by mouth every other day. , Disp: , Rfl:  .  [START ON 02/19/2018] oxyCODONE-acetaminophen (PERCOCET) 7.5-325 MG tablet, Take 1 tablet by mouth 5 (five) times daily as needed for severe pain., Disp: 150 tablet, Rfl: 0 .  [START ON 12/21/2017] oxyCODONE-acetaminophen (PERCOCET) 7.5-325 MG tablet, Take 1 tablet by mouth 5 (five) times daily as needed for severe pain., Disp: 150 tablet, Rfl: 0  ROS  Constitutional: Denies any fever or chills Gastrointestinal: No reported hemesis, hematochezia, vomiting, or acute GI distress Musculoskeletal: Denies any acute onset joint swelling, redness, loss of ROM, or weakness Neurological: No reported episodes of  acute onset apraxia, aphasia, dysarthria, agnosia, amnesia, paralysis, loss of coordination, or loss of consciousness  Allergies  Ms. Tackitt has No Known Allergies.  [place]  Drug: Ms. Grawe  reports that she does not use drugs. Alcohol:  reports that she does not drink alcohol. Tobacco:  reports that she has quit smoking. She has never used smokeless tobacco. Medical:  has a past medical history of Absolute anemia (04/11/2015), Angina pectoris ([place]), Anxiety, Arthritis, Arthropathy of sacroiliac joint (04/11/2015), Asthma, Atypical face pain (04/24/2015), CAD (coronary artery disease), Chronic back pain, Depression, Fibromyalgia, GERD (gastroesophageal reflux disease), 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) (04/11/2015), Hyperlipidemia, Hypertension, Low back pain (04/06/2015), Lumbar radicular pain (04/18/2015), Migraine, Narrowing of intervertebral disc space (04/11/2015), Sacroiliac joint pain (04/11/2015), and Spine disorder. Surgical: Ms. Bettcher  has a past surgical history that includes Cholecystectomy; Abdominal hysterectomy; Neck surgery (Bilateral);  Appendectomy; Neck surgery; Shoulder surgery; Colonoscopy with propofol (N/A, 07/19/2015); and caract surger. Family: family history includes Diabetes in her father and mother; Hypertension in her sister; Stroke in her mother.  Constitutional Exam  General appearance: Well nourished, well developed, and well hydrated. In no apparent acute distress Vitals:   12/10/17 1338  BP: 116/75  Pulse: 86  Resp: 16  Temp: 98.2 F (36.8 C)  TempSrc: Oral  SpO2: 100%  Weight: 149 lb (67.6 kg)  Height: 5' 0.5" (1.537 m)  Psych/Mental status: Alert, oriented x 3 (person, place, & time)       Eyes: PERLA Respiratory: No evidence of acute respiratory distress  Lumbar Spine Area Exam  Skin & Axial Inspection: No masses, redness, or swelling Alignment: Symmetrical Functional ROM: Unrestricted ROM       Stability: No instability detected Muscle Tone/Strength: Functionally intact. No obvious neuro-muscular anomalies detected. Sensory (Neurological): Unimpaired Palpation: Complains of area being tender to palpation       Provocative Tests: Lumbar Hyperextension/rotation test: deferred today       Lumbar quadrant test (Kemp's test): deferred today       Lumbar Lateral bending test: deferred today       Patrick's Maneuver: deferred today                   FABER test: deferred today       Thigh-thrust test: deferred today       S-I compression test: deferred today       S-I distraction test: deferred today        Gait & Posture Assessment  Ambulation: Unassisted Gait: Relatively normal for age and body habitus Posture: WNL   Lower Extremity Exam    Side: Right lower extremity  Side: Left lower extremity  Stability: No instability observed          Stability: No instability observed          Skin & Extremity Inspection: Skin color, temperature, and hair growth are WNL. No peripheral edema or cyanosis. No masses, redness, swelling, asymmetry, or associated skin lesions. No contractures.  Skin &  Extremity Inspection: Skin color, temperature, and hair growth are WNL. No peripheral edema or cyanosis. No masses, redness, swelling, asymmetry, or associated skin lesions. No contractures.  Functional ROM: Unrestricted ROM                  Functional ROM: Unrestricted ROM                  Muscle Tone/Strength: Functionally intact. No obvious neuro-muscular anomalies detected.  Muscle Tone/Strength: Functionally intact. No obvious neuro-muscular  anomalies detected.  Sensory (Neurological): Unimpaired  Sensory (Neurological): Unimpaired  Palpation: No palpable anomalies  Palpation: No palpable anomalies   Assessment  Primary Diagnosis & Pertinent Problem List: The primary encounter diagnosis was Chronic lumbar radicular pain (Primary Source of Pain) (Left) (S1 Dermatome). Diagnoses of Chronic sacroiliac joint pain (Bilateral) (L>R), Spondylosis of lumbar spine, Cervical spondylosis, Myofascial pain, Chronic pain syndrome, Neurogenic pain, and Long term prescription opiate use were also pertinent to this visit.  Status Diagnosis  Worsening Worsening Worsening 1. Chronic lumbar radicular pain (Primary Source of Pain) (Left) (S1 Dermatome)   2. Chronic sacroiliac joint pain (Bilateral) (L>R)   3. Spondylosis of lumbar spine   4. Cervical spondylosis   5. Myofascial pain   6. Chronic pain syndrome   7. Neurogenic pain   8. Long term prescription opiate use     Problems updated and reviewed during this visit: No problems updated. Plan of Care  Pharmacotherapy (Medications Ordered): Meds ordered this encounter  Medications  . oxyCODONE-acetaminophen (PERCOCET) 7.5-325 MG tablet    Sig: Take 1 tablet by mouth 5 (five) times daily as needed for severe pain.    Dispense:  150 tablet    Refill:  0    Do not place this medication, or any other prescription from our practice, on "Automatic Refill". Patient may have prescription filled one day early if pharmacy is closed on scheduled refill  date. Do not fill until:02/19/2018 To last until:03/21/2018    Order Specific Question:   Supervising Provider    Answer:   Milinda Pointer [phone]  . oxyCODONE-acetaminophen (PERCOCET) 7.5-325 MG tablet    Sig: Take 1 tablet by mouth 5 (five) times daily as needed for severe pain.    Dispense:  150 tablet    Refill:  0    Do not place this medication, or any other prescription from our practice, on "Automatic Refill". Patient may have prescription filled one day early if pharmacy is closed on scheduled refill date. Do not fill until:01/20/2018 To last until: 02/19/2018    Order Specific Question:   Supervising Provider    Answer:   Milinda Pointer [phone]  . oxyCODONE-acetaminophen (PERCOCET) 7.5-325 MG tablet    Sig: Take 1 tablet by mouth 5 (five) times daily as needed for severe pain.    Dispense:  150 tablet    Refill:  0    Do not place this medication, or any other prescription from our practice, on "Automatic Refill". Patient may have prescription filled one day early if pharmacy is closed on scheduled refill date. Do not fill until:12/21/2017 To last until:01/20/2018    Order Specific Question:   Supervising Provider    Answer:   Milinda Pointer [phone]  . cyclobenzaprine (FLEXERIL) 10 MG tablet    Sig: Take 1 tablet (10 mg total) by mouth 3 (three) times daily as needed for muscle spasms.    Dispense:  270 tablet    Refill:  0    Do not place this medication, or any other prescription from our practice, on "Automatic Refill". Patient may have prescription filled one day early if pharmacy is closed on scheduled refill date.    Order Specific Question:   Supervising Provider    Answer:   Milinda Pointer [phone]  . gabapentin (NEURONTIN) 300 MG capsule    Sig: Take 1 capsule (300 mg total) by mouth 2 (two) times daily.    Dispense:  180 capsule    Refill:  0    Do  not add this medication to the electronic "Automatic Refill" notification system. Patient may have  prescription filled one day early if pharmacy is closed on scheduled refill date.    Order Specific Question:   Supervising Provider    Answer:   Milinda Pointer [[id number]]   New Prescriptions   No medications on file   Medications administered today: Kendyl L. Enwright had no medications administered during this visit. Lab-work, procedure(s), and/or referral(s): Orders Placed This Encounter  Procedures  . MR LUMBAR SPINE WO CONTRAST  . MR SACRUM SI JOINTS WO CONTRAST  . DG Si Joints  . ToxASSURE Select 13 (MW), Urine   Imaging and/or referral(s): MR LUMBAR SPINE WO CONTRAST MR SACRUM SI JOINTS WO CONTRAST  Interventional therapies: Planned, scheduled, and/or pending: Not at this time.    Considering:  Diagnostic caudal epidural steroid injection + diagnostic epidurogram Palliative Right sided lumbar facet radiofrequency ablation + Left S-I Block Diagnostic cervical epidural steroid injection Diagnostic bilateral cervical facet block Possible bilateral cervical facet radiofrequencyablation  Diagnostic bilateral lumbar facet block Possible bilateral lumbar facet radiofrequencyablation  Diagnostic left L4-5 lumbar epidural steroid injection  Diagnostic left S1 selective nerve root block Diagnostic bilateral sacroiliac joint block Possible bilateral sacroiliac joint radiofrequencyablation  Diagnostic right trochanteric bursa injection   Palliative PRN treatment(s):  Palliativecervical epidural steroid injection  Palliativebilateral cervical facet block Palliativebilateral lumbar facet block Palliativeleft L4-5 lumbar epiduralsteroid injection  Palliativeleft caudal epidural steroid injection  Palliativeleft S1 selective nerve root block Palliativebilateral sacroiliac joint block Palliativeright trochanteric bursa injec      Provider-requested follow-up: Return in about 3 months (around 03/12/2018) for MedMgmt with Me Dionisio David), F/U  eval follow MRI poss change need in treatment, MRI.  No future appointments. Primary Care Physician: Lavera Guise, MD Location: [facility] Note by: Vevelyn Francois NP Date: 12/10/2017; Time: 3:57 PM  Pain Score Disclaimer: We use the NRS-11 scale. This is a self-reported, subjective measurement of pain severity with only modest accuracy. It is used primarily to identify changes within a particular patient. It must be understood that outpatient pain scales are significantly less accurate that those used for research, where they can be applied under ideal controlled circumstances with minimal exposure to variables. In reality, the score is likely to be a combination of pain intensity and pain affect, where pain affect describes the degree of emotional arousal or changes in action readiness caused by the sensory experience of pain. Factors such as social and work situation, setting, emotional state, anxiety levels, expectation, and prior pain experience may influence pain perception and show large inter-individual differences that may also be affected by time variables.  Patient instructions provided during this appointment: Patient Instructions  ____________________________________________________________________________________________  Medication Rules  Applies to: All patients receiving prescriptions (written or electronic).  Pharmacy of record: Pharmacy where electronic prescriptions will be sent. If written prescriptions are taken to a different pharmacy, please inform the nursing staff. The pharmacy listed in the electronic medical record should be the one where you would like electronic prescriptions to be sent.  Prescription refills: Only during scheduled appointments. Applies to both, written and electronic prescriptions.  NOTE: The following applies primarily to controlled substances (Opioid* Pain Medications).   Patient's responsibilities: 1. Pain  Pills: Bring all pain pills to every appointment (except for procedure appointments). 2. Pill Bottles: Bring pills in original pharmacy bottle. Always bring newest bottle. Bring bottle, even if empty. 3. Medication refills: You are responsible for knowing and keeping  track of what medications you need refilled. The day before your appointment, write a list of all prescriptions that need to be refilled. Bring that list to your appointment and give it to the admitting nurse. Prescriptions will be written only during appointments. If you forget a medication, it will not be "Called in", "Faxed", or "electronically sent". You will need to get another appointment to get these prescribed. 4. Prescription Accuracy: You are responsible for carefully inspecting your prescriptions before leaving our office. Have the discharge nurse carefully go over each prescription with you, before taking them home. Make sure that your name is accurately spelled, that your address is correct. Check the name and dose of your medication to make sure it is accurate. Check the number of pills, and the written instructions to make sure they are clear and accurate. Make sure that you are given enough medication to last until your next medication refill appointment. 5. Taking Medication: Take medication as prescribed. Never take more pills than instructed. Never take medication more frequently than prescribed. Taking less pills or less frequently is permitted and encouraged, when it comes to controlled substances (written prescriptions).  6. Inform other Doctors: Always inform, all of your healthcare providers, of all the medications you take. 7. Pain Medication from other Providers: You are not allowed to accept any additional pain medication from any other Doctor or Healthcare provider. There are two exceptions to this rule. (see below) In the event that you require additional pain medication, you are responsible for notifying us, as stated  below. 8. Medication Agreement: You are responsible for carefully reading and following our Medication Agreement. This must be signed before receiving any prescriptions from our practice. Safely store a copy of your signed Agreement. Violations to the Agreement will result in no further prescriptions. (Additional copies of our Medication Agreement are available upon request.) 9. Laws, Rules, & Regulations: All patients are expected to follow all Federal and [employer], [employer], Rules, [employer]. Ignorance of the Laws does not constitute a valid excuse. The use of any illegal substances is prohibited. 10. Adopted CDC guidelines & recommendations: Target dosing levels will be at or below 60 MME/day. Use of benzodiazepines** is not recommended.  Exceptions: There are only two exceptions to the rule of not receiving pain medications from other Healthcare Providers. 1. Exception #1 (Emergencies): In the event of an emergency (i.e.: accident requiring emergency care), you are allowed to receive additional pain medication. However, you are responsible for: As soon as you are able, call our office [phone], at any time of the day or night, and leave a message stating your name, the date and nature of the emergency, and the name and dose of the medication prescribed. In the event that your call is answered by a member of our staff, make sure to document and save the date, time, and the name of the person that took your information.  2. Exception #2 (Planned Surgery): In the event that you are scheduled by another doctor or dentist to have any type of surgery or procedure, you are allowed (for a period no longer than 30 days), to receive additional pain medication, for the acute post-op pain. However, in this case, you are responsible for picking up a copy of our "Post-op Pain Management for Surgeons" handout, and giving it to your surgeon or dentist. This document is available at our office, and does not  require an appointment to obtain it. Simply go to our office during business  hours (Monday-Thursday from 8:00 AM to 4:00 PM) (Friday 8:00 AM to 12:00 Noon) or if you have a scheduled appointment with [place], prior to your surgery, and ask for it by name. In addition, you will need to provide [place] with your name, name of your surgeon, type of surgery, and date of procedure or surgery.  *Opioid medications include: morphine, codeine, oxycodone, oxymorphone, hydrocodone, hydromorphone, meperidine, tramadol, tapentadol, buprenorphine, fentanyl, methadone. **Benzodiazepine medications include: diazepam (Valium), alprazolam (Xanax), clonazepam (Klonopine), lorazepam (Ativan), clorazepate (Tranxene), chlordiazepoxide (Librium), estazolam (Prosom), oxazepam (Serax), temazepam (Restoril), triazolam (Halcion) (Last updated: 08/28/2017) ____________________________________________________________________________________________

## 2017-12-11 ENCOUNTER — Encounter: Payer: Self-pay | Admitting: Nurse Practitioner

## 2017-12-11 ENCOUNTER — Other Ambulatory Visit: Payer: Self-pay | Admitting: Nurse Practitioner

## 2017-12-11 DIAGNOSIS — M898X5 Other specified disorders of bone, thigh: Secondary | ICD-10-CM | POA: Insufficient documentation

## 2017-12-11 NOTE — Progress Notes (Signed)
Results were reviewed and found to be: abnormal  Further testing may be useful  Review would suggest no procedures needed at this time

## 2017-12-17 LAB — TOXASSURE SELECT 13 (MW), URINE

## 2017-12-26 ENCOUNTER — Ambulatory Visit
Admission: RE | Admit: 2017-12-26 | Discharge: 2017-12-26 | Disposition: A | Discharge status: Home / Self Care | Payer: Medicare Other | Source: Ambulatory Visit | Attending: Nurse Practitioner | Admitting: Nurse Practitioner

## 2017-12-26 DIAGNOSIS — R600 Localized edema: Secondary | ICD-10-CM | POA: Insufficient documentation

## 2017-12-26 DIAGNOSIS — G8929 Other chronic pain: Secondary | ICD-10-CM

## 2017-12-26 DIAGNOSIS — M5127 Other intervertebral disc displacement, lumbosacral region: Secondary | ICD-10-CM | POA: Insufficient documentation

## 2017-12-26 DIAGNOSIS — N9489 Other specified conditions associated with female genital organs and menstrual cycle: Secondary | ICD-10-CM | POA: Insufficient documentation

## 2017-12-26 DIAGNOSIS — M533 Sacrococcygeal disorders, not elsewhere classified: Principal | ICD-10-CM

## 2017-12-26 DIAGNOSIS — M4687 Other specified inflammatory spondylopathies, lumbosacral region: Secondary | ICD-10-CM | POA: Insufficient documentation

## 2017-12-26 DIAGNOSIS — M47812 Spondylosis without myelopathy or radiculopathy, cervical region: Secondary | ICD-10-CM | POA: Diagnosis not present

## 2017-12-26 DIAGNOSIS — M4686 Other specified inflammatory spondylopathies, lumbar region: Secondary | ICD-10-CM | POA: Diagnosis not present

## 2017-12-30 ENCOUNTER — Telehealth: Payer: Self-pay | Admitting: Nurse Practitioner

## 2017-12-30 ENCOUNTER — Encounter: Payer: Self-pay | Admitting: Nurse Practitioner

## 2017-12-30 NOTE — Telephone Encounter (Signed)
Patient would like to get results of MRI she had done Friday

## 2017-12-30 NOTE — Progress Notes (Signed)
RE: Abnormal Xrays  Received: 2 weeks ago  Message Contents  Lavera Guise, MD  Vevelyn Francois, NP  Cc: Kerman Passey, will set her up for f/u   Previous Messages    ----- Message -----  From: Vevelyn Francois, NP  Sent: 12/11/2017  1:11 PM  To: Lavera Guise, MD  Subject: Abnormal Xrays                  Good afternoon Dr Humphrey Rolls ,   Jacqueline Thornton had images of her SI joint for continued pain not responding to interventional therapy. 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 Consider further nonemergent  evaluation as an outpatient with nuclear medicine bone scan.    I have notified Jacqueline Thornton and made her aware that she will need to have a bone scan completed. I informed her that it was non-emergent.  I wanted to know if you wanted to order this.  I will be out of the office for 2 weeks. I don't mind ordering this but I don't want the results sitting around .  If you would like me to order, do you feel a full body scan would be more appropriate given her history of the breast cancer.  She indicated that she does not have an oncologist.

## 2017-12-30 NOTE — Telephone Encounter (Signed)
Patient instructed to look in [facility] for results.

## 2017-12-30 NOTE — Telephone Encounter (Signed)
Per Jacqueline Thornton patient called and instructed to call Dr. Humphrey Rolls and follow up on the bone scan order and MRI results. Jacqueline Thornton sent a message via staff message on [facility] to Dr. Humphrey Rolls- about two weeks ago re: need for bone scan order. Patient states she will call and check the status. Told her to call us back for any further issues.

## 2017-12-31 ENCOUNTER — Telehealth: Payer: Self-pay | Admitting: *Deleted

## 2017-12-31 NOTE — Telephone Encounter (Signed)
Called patient and let her know that the notes from [place] and the results of her latest imaging were faxed to [facility] and the staff was made aware. Instructed patient to follow up this afternoon to make sure physician got the notes. Patient understands.

## 2018-01-06 ENCOUNTER — Other Ambulatory Visit: Payer: Self-pay | Admitting: Internal Medicine

## 2018-01-06 DIAGNOSIS — R1909 Other intra-abdominal and pelvic swelling, mass and lump: Secondary | ICD-10-CM

## 2018-01-06 DIAGNOSIS — M899 Disorder of bone, unspecified: Secondary | ICD-10-CM

## 2018-01-14 ENCOUNTER — Encounter: Admission: RE | Admit: 2018-01-14 | Payer: Medicare Other | Source: Ambulatory Visit

## 2018-01-14 ENCOUNTER — Ambulatory Visit
Admission: RE | Admit: 2018-01-14 | Discharge: 2018-01-14 | Disposition: A | Discharge status: Home / Self Care | Payer: Medicare Other | Source: Ambulatory Visit | Attending: Internal Medicine | Admitting: Internal Medicine

## 2018-01-14 ENCOUNTER — Encounter
Admission: RE | Admit: 2018-01-14 | Discharge: 2018-01-14 | Disposition: A | Discharge status: Home / Self Care | Payer: Medicare Other | Source: Ambulatory Visit | Attending: Internal Medicine | Admitting: Internal Medicine

## 2018-01-14 DIAGNOSIS — M899 Disorder of bone, unspecified: Secondary | ICD-10-CM | POA: Diagnosis not present

## 2018-01-14 DIAGNOSIS — R1909 Other intra-abdominal and pelvic swelling, mass and lump: Secondary | ICD-10-CM | POA: Diagnosis present

## 2018-01-14 MED ORDER — TECHNETIUM TC 99M MEDRONATE IV KIT
22.8900 | PACK | Freq: Once | INTRAVENOUS | Status: AC | PRN
  Administered 2018-01-14: 22.89 via INTRAVENOUS

## 2018-01-23 ENCOUNTER — Telehealth: Payer: Self-pay

## 2018-01-23 NOTE — Telephone Encounter (Signed)
Pt left message that she's in pain and needs procedure. I dont see order to schedule procedure.

## 2018-01-26 NOTE — Telephone Encounter (Signed)
Patient states that she is having pain in her lower back on both sides and denies radiation into legs.  States she would like the Facet injection.  From symptoms given to me, the Facet injection would be appropriate.  Please schedule for Bilateral Lumbar Facet injection with moderate sedation.  There is a standing order for this. Thank you.  Pre procedure instructions have been given.  Please call patient with appointment.

## 2018-01-28 NOTE — Telephone Encounter (Signed)
Scheduled patient for 02-03-18 for lumbar facet w/ sedation/

## 2018-02-03 ENCOUNTER — Ambulatory Visit
Admission: RE | Admit: 2018-02-03 | Discharge: 2018-02-03 | Disposition: A | Discharge status: Home / Self Care | Payer: Medicare Other | Source: Ambulatory Visit | Attending: Pain Medicine | Admitting: Pain Medicine

## 2018-02-03 ENCOUNTER — Encounter: Payer: Self-pay | Admitting: Pain Medicine

## 2018-02-03 ENCOUNTER — Other Ambulatory Visit: Payer: Self-pay

## 2018-02-03 ENCOUNTER — Ambulatory Visit (HOSPITAL_BASED_OUTPATIENT_CLINIC_OR_DEPARTMENT_OTHER): Payer: Medicare Other | Admitting: Pain Medicine

## 2018-02-03 VITALS — BP 130/71 | HR 98 | Temp 98.6°F | Resp 19 | Ht 60.0 in | Wt 149.0 lb

## 2018-02-03 DIAGNOSIS — F419 Anxiety disorder, unspecified: Secondary | ICD-10-CM | POA: Diagnosis not present

## 2018-02-03 DIAGNOSIS — G8929 Other chronic pain: Secondary | ICD-10-CM

## 2018-02-03 DIAGNOSIS — Z7951 Long term (current) use of inhaled steroids: Secondary | ICD-10-CM | POA: Diagnosis not present

## 2018-02-03 DIAGNOSIS — M47817 Spondylosis without myelopathy or radiculopathy, lumbosacral region: Secondary | ICD-10-CM | POA: Insufficient documentation

## 2018-02-03 DIAGNOSIS — Z9049 Acquired absence of other specified parts of digestive tract: Secondary | ICD-10-CM | POA: Insufficient documentation

## 2018-02-03 DIAGNOSIS — M545 Low back pain: Secondary | ICD-10-CM | POA: Diagnosis present

## 2018-02-03 DIAGNOSIS — Z79899 Other long term (current) drug therapy: Secondary | ICD-10-CM | POA: Insufficient documentation

## 2018-02-03 DIAGNOSIS — Z90710 Acquired absence of both cervix and uterus: Secondary | ICD-10-CM | POA: Insufficient documentation

## 2018-02-03 DIAGNOSIS — Z79891 Long term (current) use of opiate analgesic: Secondary | ICD-10-CM | POA: Diagnosis not present

## 2018-02-03 DIAGNOSIS — M47816 Spondylosis without myelopathy or radiculopathy, lumbar region: Secondary | ICD-10-CM

## 2018-02-03 MED ORDER — FENTANYL CITRATE (PF) 100 MCG/2ML IJ SOLN
25.0000 ug | INTRAMUSCULAR | Status: DC | PRN
  Administered 2018-02-03: 100 ug via INTRAVENOUS
  Filled 2018-02-03: qty 2

## 2018-02-03 MED ORDER — LACTATED RINGERS IV SOLN
1000.0000 mL | Freq: Once | INTRAVENOUS | Status: AC
  Administered 2018-02-03: 1000 mL via INTRAVENOUS

## 2018-02-03 MED ORDER — LIDOCAINE HCL 2 % IJ SOLN
20.0000 mL | Freq: Once | INTRAMUSCULAR | Status: AC
  Administered 2018-02-03: 400 mg
  Filled 2018-02-03: qty 20

## 2018-02-03 MED ORDER — ROPIVACAINE HCL 2 MG/ML IJ SOLN
18.0000 mL | Freq: Once | INTRAMUSCULAR | Status: AC
  Administered 2018-02-03: 10 mL via PERINEURAL
  Filled 2018-02-03: qty 20

## 2018-02-03 MED ORDER — TRIAMCINOLONE ACETONIDE 40 MG/ML IJ SUSP
80.0000 mg | Freq: Once | INTRAMUSCULAR | Status: AC
  Administered 2018-02-03: 40 mg
  Filled 2018-02-03: qty 2

## 2018-02-03 MED ORDER — MIDAZOLAM HCL 5 MG/5ML IJ SOLN
1.0000 mg | INTRAMUSCULAR | Status: DC | PRN
  Administered 2018-02-03: 3 mg via INTRAVENOUS
  Filled 2018-02-03: qty 5

## 2018-02-03 NOTE — Patient Instructions (Signed)

## 2018-02-03 NOTE — Progress Notes (Signed)
Patient's Name: Jacqueline Thornton  MRN: [id number]  Referring Provider: Lavera Guise, MD  DOB: [birth year]  PCP: Lavera Guise, MD  DOS: 02/03/2018  Note by: Gaspar Cola, MD  Service setting: Ambulatory outpatient  Specialty: Interventional Pain Management  Patient type: Established  Location: [facility] (AMB) Pain Management Facility  Visit type: Interventional Procedure   Primary Reason for Visit: Interventional Pain Management Treatment. CC: Back Pain (right, lower)  Procedure:          Anesthesia, Analgesia, Anxiolysis:  Type: Lumbar Facet, Medial Branch Block(s) #1  Primary Purpose: Diagnostic Region: Posterolateral Lumbosacral Spine Level: L2, L3, L4, L5, & S1 Medial Branch Level(s). Injecting these levels blocks the L3-4, L4-5, and L5-S1 lumbar facet joints. Laterality: Bilateral  Type: Moderate (Conscious) Sedation combined with Local Anesthesia Indication(s): Analgesia and Anxiety Route: Intravenous (IV) IV Access: Secured Sedation: Meaningful verbal contact was maintained at all times during the procedure  Local Anesthetic: Lidocaine 1-2%   Indications: 1. Spondylosis without myelopathy or radiculopathy, lumbosacral region   2. Lumbar facet syndrome (Bilateral) (R>L)   3. Lumbar facet hypertrophy   4. Chronic low back pain (Secondary source of pain) (Bilateral) (midline to tailbone) (R>L)    Pain Score: Pre-procedure: 2 /10 Post-procedure: 2 /10  Pre-op Assessment:  Jacqueline Thornton is a 67 y.o. (year old), female patient, seen today for interventional treatment. She  has a past surgical history that includes Cholecystectomy; Abdominal hysterectomy; Neck surgery (Bilateral); Appendectomy; Neck surgery; Shoulder surgery; Colonoscopy with propofol (N/A, 07/19/2015); and caract surger. Jacqueline Thornton has a current medication list which includes the following prescription(s): acetaminophen, cholecalciferol, cyclobenzaprine, duloxetine, enalapril, esomeprazole, fluticasone, gabapentin,  magnesium oxide (antacid), melatonin, oxycodone-acetaminophen, oxycodone-acetaminophen, rosuvastatin, and oxycodone-acetaminophen, and the following Facility-Administered Medications: fentanyl, lactated ringers, and midazolam. Her primarily concern today is the Back Pain (right, lower)  Initial Vital Signs:  Pulse/HCG Rate: 98ECG Heart Rate: 87 Temp: 98.6 F (37 C) Resp: 18 BP: (!) 145/68 SpO2: 100 %  BMI: Estimated body mass index is 29.1 kg/m as calculated from the following:   Height as of this encounter: 5' (1.524 m).   Weight as of this encounter: 149 lb (67.6 kg).  Risk Assessment: Allergies: Reviewed. She has No Known Allergies.  Allergy Precautions: None required Coagulopathies: Reviewed. None identified.  Blood-thinner therapy: None at this time Active Infection(s): Reviewed. None identified. Jacqueline Thornton is afebrile  Site Confirmation: Jacqueline Thornton was asked to confirm the procedure and laterality before marking the site Procedure checklist: Completed Consent: Before the procedure and under the influence of no sedative(s), amnesic(s), or anxiolytics, the patient was informed of the treatment options, risks and possible complications. To fulfill our ethical and legal obligations, as recommended by the American Medical Association's Code of Ethics, I have informed the patient of my clinical impression; the nature and purpose of the treatment or procedure; the risks, benefits, and possible complications of the intervention; the alternatives, including doing nothing; the risk(s) and benefit(s) of the alternative treatment(s) or procedure(s); and the risk(s) and benefit(s) of doing nothing. The patient was provided information about the general risks and possible complications associated with the procedure. These may include, but are not limited to: failure to achieve desired goals, infection, bleeding, organ or nerve damage, allergic reactions, paralysis, and death. In addition, the  patient was informed of those risks and complications associated to Spine-related procedures, such as failure to decrease pain; infection (i.e.: Meningitis, epidural or intraspinal abscess); bleeding (i.e.: epidural hematoma, subarachnoid hemorrhage, or any other type  of intraspinal or peri-dural bleeding); organ or nerve damage (i.e.: Any type of peripheral nerve, nerve root, or spinal cord injury) with subsequent damage to sensory, motor, and/or autonomic systems, resulting in permanent pain, numbness, and/or weakness of one or several areas of the body; allergic reactions; (i.e.: anaphylactic reaction); and/or death. Furthermore, the patient was informed of those risks and complications associated with the medications. These include, but are not limited to: allergic reactions (i.e.: anaphylactic or anaphylactoid reaction(s)); adrenal axis suppression; blood sugar elevation that in diabetics may result in ketoacidosis or comma; water retention that in patients with history of congestive heart failure may result in shortness of breath, pulmonary edema, and decompensation with resultant heart failure; weight gain; swelling or edema; medication-induced neural toxicity; particulate matter embolism and blood vessel occlusion with resultant organ, and/or nervous system infarction; and/or aseptic necrosis of one or more joints. Finally, the patient was informed that Medicine is not an exact science; therefore, there is also the possibility of unforeseen or unpredictable risks and/or possible complications that may result in a catastrophic outcome. The patient indicated having understood very clearly. We have given the patient no guarantees and we have made no promises. Enough time was given to the patient to ask questions, all of which were answered to the patient's satisfaction. Jacqueline Thornton has indicated that she wanted to continue with the procedure. Attestation: I, the ordering provider, attest that I have  discussed with the patient the benefits, risks, side-effects, alternatives, likelihood of achieving goals, and potential problems during recovery for the procedure that I have provided informed consent. Date  Time: 02/03/2018  8:13 AM  Pre-Procedure Preparation:  Monitoring: As per clinic protocol. Respiration, ETCO2, SpO2, BP, heart rate and rhythm monitor placed and checked for adequate function Safety Precautions: Patient was assessed for positional comfort and pressure points before starting the procedure. Time-out: I initiated and conducted the "Time-out" before starting the procedure, as per protocol. The patient was asked to participate by confirming the accuracy of the "Time Out" information. Verification of the correct person, site, and procedure were performed and confirmed by me, the nursing staff, and the patient. "Time-out" conducted as per Joint Commission's Universal Protocol (UP.01.01.01). Time: 6568  Description of Procedure:          Position: Prone Laterality: Bilateral. The procedure was performed in identical fashion on both sides. Levels:  L2, L3, L4, L5, & S1 Medial Branch Level(s) Area Prepped: Posterior Lumbosacral Region Prepping solution: ChloraPrep (2% chlorhexidine gluconate and 70% isopropyl alcohol) Safety Precautions: Aspiration looking for blood return was conducted prior to all injections. At no point did we inject any substances, as a needle was being advanced. Before injecting, the patient was told to immediately notify me if she was experiencing any new onset of "ringing in the ears, or metallic taste in the mouth". No attempts were made at seeking any paresthesias. Safe injection practices and needle disposal techniques used. Medications properly checked for expiration dates. SDV (single dose vial) medications used. After the completion of the procedure, all disposable equipment used was discarded in the proper designated medical waste containers. Local  Anesthesia: Protocol guidelines were followed. The patient was positioned over the fluoroscopy table. The area was prepped in the usual manner. The time-out was completed. The target area was identified using fluoroscopy. A 12-in long, straight, sterile hemostat was used with fluoroscopic guidance to locate the targets for each level blocked. Once located, the skin was marked with an approved surgical skin marker. Once all sites  were marked, the skin (epidermis, dermis, and hypodermis), as well as deeper tissues (fat, connective tissue and muscle) were infiltrated with a small amount of a short-acting local anesthetic, loaded on a 10cc syringe with a 25G, 1.5-in  Needle. An appropriate amount of time was allowed for local anesthetics to take effect before proceeding to the next step. Local Anesthetic: Lidocaine 2.0% The unused portion of the local anesthetic was discarded in the proper designated containers. Technical explanation of process:  L2 Medial Branch Nerve Block (MBB): The target area for the L2 medial branch is at the junction of the postero-lateral aspect of the superior articular process and the superior, posterior, and medial edge of the transverse process of L3. Under fluoroscopic guidance, a Quincke needle was inserted until contact was made with os over the superior postero-lateral aspect of the pedicular shadow (target area). After negative aspiration for blood, 0.5 mL of the nerve block solution was injected without difficulty or complication. The needle was removed intact. L3 Medial Branch Nerve Block (MBB): The target area for the L3 medial branch is at the junction of the postero-lateral aspect of the superior articular process and the superior, posterior, and medial edge of the transverse process of L4. Under fluoroscopic guidance, a Quincke needle was inserted until contact was made with os over the superior postero-lateral aspect of the pedicular shadow (target area). After negative  aspiration for blood, 0.5 mL of the nerve block solution was injected without difficulty or complication. The needle was removed intact. L4 Medial Branch Nerve Block (MBB): The target area for the L4 medial branch is at the junction of the postero-lateral aspect of the superior articular process and the superior, posterior, and medial edge of the transverse process of L5. Under fluoroscopic guidance, a Quincke needle was inserted until contact was made with os over the superior postero-lateral aspect of the pedicular shadow (target area). After negative aspiration for blood, 0.5 mL of the nerve block solution was injected without difficulty or complication. The needle was removed intact. L5 Medial Branch Nerve Block (MBB): The target area for the L5 medial branch is at the junction of the postero-lateral aspect of the superior articular process and the superior, posterior, and medial edge of the sacral ala. Under fluoroscopic guidance, a Quincke needle was inserted until contact was made with os over the superior postero-lateral aspect of the pedicular shadow (target area). After negative aspiration for blood, 0.5 mL of the nerve block solution was injected without difficulty or complication. The needle was removed intact. S1 Medial Branch Nerve Block (MBB): The target area for the S1 medial branch is at the posterior and inferior 6 o'clock position of the L5-S1 facet joint. Under fluoroscopic guidance, the Quincke needle inserted for the L5 MBB was redirected until contact was made with os over the inferior and postero aspect of the sacrum, at the 6 o' clock position under the L5-S1 facet joint (Target area). After negative aspiration for blood, 0.5 mL of the nerve block solution was injected without difficulty or complication. The needle was removed intact. Procedural Needles: 22-gauge, 3.5-inch, Quincke needles used for all levels. Nerve block solution: 0.2% PF-Ropivacaine + Triamcinolone (40 mg/mL) diluted  to a final concentration of 4 mg of Triamcinolone/mL of Ropivacaine The unused portion of the solution was discarded in the proper designated containers.  Once the entire procedure was completed, the treated area was cleaned, making sure to leave some of the prepping solution back to take advantage of its long term bactericidal  properties.   Illustration of the posterior view of the lumbar spine and the posterior neural structures. Laminae of L2 through S1 are labeled. DPRL5, dorsal primary ramus of L5; DPRS1, dorsal primary ramus of S1; DPR3, dorsal primary ramus of L3; FJ, facet (zygapophyseal) joint L3-L4; I, inferior articular process of L4; LB1, lateral branch of dorsal primary ramus of L1; IAB, inferior articular branches from L3 medial branch (supplies L4-L5 facet joint); IBP, intermediate branch plexus; MB3, medial branch of dorsal primary ramus of L3; NR3, third lumbar nerve root; S, superior articular process of L5; SAB, superior articular branches from L4 (supplies L4-5 facet joint also); TP3, transverse process of L3.  Vitals:   02/03/18 0905 02/03/18 0909 02/03/18 0920 02/03/18 0930  BP: 127/80 132/69 123/74 130/71  Pulse:      Resp: 15 10 18 19   Temp:  (!) 97.5 F (36.4 C)  98.6 F (37 C)  TempSrc:  Temporal  Tympanic  SpO2: 100% 95% 100% 100%  Weight:      Height:        Start Time: 0852 hrs. End Time: 0904 hrs.  Imaging Guidance (Spinal):          Type of Imaging Technique: Fluoroscopy Guidance (Spinal) Indication(s): Assistance in needle guidance and placement for procedures requiring needle placement in or near specific anatomical locations not easily accessible without such assistance. Exposure Time: Please see nurses notes. Contrast: None used. Fluoroscopic Guidance: I was personally present during the use of fluoroscopy. "Tunnel Vision Technique" used to obtain the best possible view of the target area. Parallax error corrected before commencing the procedure.  "Direction-depth-direction" technique used to introduce the needle under continuous pulsed fluoroscopy. Once target was reached, antero-posterior, oblique, and lateral fluoroscopic projection used confirm needle placement in all planes. Images permanently stored in EMR. Interpretation: No contrast injected. I personally interpreted the imaging intraoperatively. Adequate needle placement confirmed in multiple planes. Permanent images saved into the patient's record.  Antibiotic Prophylaxis:   Anti-infectives (From admission, onward)   None     Indication(s): None identified  Post-operative Assessment:  Post-procedure Vital Signs:  Pulse/HCG Rate: 9879 Temp: 98.6 F (37 C) Resp: 19 BP: 130/71 SpO2: 100 %  EBL: None  Complications: No immediate post-treatment complications observed by team, or reported by patient.  Note: The patient tolerated the entire procedure well. A repeat set of vitals were taken after the procedure and the patient was kept under observation following institutional policy, for this type of procedure. Post-procedural neurological assessment was performed, showing return to baseline, prior to discharge. The patient was provided with post-procedure discharge instructions, including a section on how to identify potential problems. Should any problems arise concerning this procedure, the patient was given instructions to immediately contact us, at any time, without hesitation. In any case, we plan to contact the patient by telephone for a follow-up status report regarding this interventional procedure.  Comments:  No additional relevant information.  Plan of Care    Imaging Orders     DG C-Arm 1-60 Min-No Report  Procedure Orders     LUMBAR FACET(MEDIAL BRANCH NERVE BLOCK) MBNB  Medications ordered for procedure: Meds ordered this encounter  Medications  . lidocaine (XYLOCAINE) 2 % (with pres) injection 400 mg  . midazolam (VERSED) 5 MG/5ML injection 1-2 mg     Make sure Flumazenil is available in the pyxis when using this medication. If oversedation occurs, administer 0.2 mg IV over 15 sec. If after 45 sec no response, administer 0.2  mg again over 1 min; may repeat at 1 min intervals; not to exceed 4 doses (1 mg)  . fentaNYL (SUBLIMAZE) injection 25-50 mcg    Make sure Narcan is available in the pyxis when using this medication. In the event of respiratory depression (RR< 8/min): Titrate NARCAN (naloxone) in increments of 0.1 to 0.2 mg IV at 2-3 minute intervals, until desired degree of reversal.  . lactated ringers infusion 1,000 mL  . ropivacaine (PF) 2 mg/mL (0.2%) (NAROPIN) injection 18 mL  . triamcinolone acetonide (KENALOG-40) injection 80 mg   Medications administered: We administered lidocaine, midazolam, fentaNYL, lactated ringers, ropivacaine (PF) 2 mg/mL (0.2%), and triamcinolone acetonide.  See the medical record for exact dosing, route, and time of administration.  New Prescriptions   No medications on file   Disposition: Discharge home  Discharge Date & Time: 02/03/2018; 1036 hrs.   Physician-requested Follow-up: Return for post-procedure eval (2 wks), w/ Dr. Dossie Arbour.  Future Appointments  Date Time Provider [place]  02/25/2018 11:15 AM Milinda Pointer, MD [facility] None  03/10/2018  1:30 PM Vevelyn Francois, NP [facility] None   Primary Care Physician: Lavera Guise, MD Location: [facility] Facility Note by: Gaspar Cola, MD Date: 02/03/2018; Time: 9:39 AM  Disclaimer:  Medicine is not an exact science. The only guarantee in medicine is that nothing is guaranteed. It is important to note that the decision to proceed with this intervention was based on the information collected from the patient. The Data and conclusions were drawn from the patient's questionnaire, the interview, and the physical examination. Because the information was provided in large part by the patient, it cannot be  guaranteed that it has not been purposely or unconsciously manipulated. Every effort has been made to obtain as much relevant data as possible for this evaluation. It is important to note that the conclusions that lead to this procedure are derived in large part from the available data. Always take into account that the treatment will also be dependent on availability of resources and existing treatment guidelines, considered by other Pain Management Practitioners as being common knowledge and practice, at the time of the intervention. For Medico-Legal purposes, it is also important to point out that variation in procedural techniques and pharmacological choices are the acceptable norm. The indications, contraindications, technique, and results of the above procedure should only be interpreted and judged by a Board-Certified Interventional Pain Specialist with extensive familiarity and expertise in the same exact procedure and technique.

## 2018-02-03 NOTE — Progress Notes (Signed)
Safety precautions to be maintained throughout the outpatient stay will include: orient to surroundings, keep bed in low position, maintain call bell within reach at all times, provide assistance with transfer out of bed and ambulation.  

## 2018-02-04 ENCOUNTER — Telehealth: Payer: Self-pay

## 2018-02-04 NOTE — Telephone Encounter (Signed)
Post procedue phone call.   Patient states she is doing great.

## 2018-02-10 ENCOUNTER — Other Ambulatory Visit: Payer: Self-pay | Admitting: Internal Medicine

## 2018-02-10 DIAGNOSIS — Z1231 Encounter for screening mammogram for malignant neoplasm of breast: Secondary | ICD-10-CM

## 2018-02-17 ENCOUNTER — Encounter: Payer: Self-pay | Admitting: Obstetrics and Gynecology

## 2018-02-17 ENCOUNTER — Ambulatory Visit (INDEPENDENT_AMBULATORY_CARE_PROVIDER_SITE_OTHER): Payer: Medicare Other | Admitting: Obstetrics and Gynecology

## 2018-02-17 VITALS — BP 110/60 | HR 100 | Ht 60.0 in | Wt 142.0 lb

## 2018-02-17 DIAGNOSIS — M4696 Unspecified inflammatory spondylopathy, lumbar region: Secondary | ICD-10-CM | POA: Insufficient documentation

## 2018-02-17 DIAGNOSIS — N83202 Unspecified ovarian cyst, left side: Secondary | ICD-10-CM | POA: Diagnosis not present

## 2018-02-17 NOTE — Progress Notes (Signed)
Obstetrics & Gynecology Office Visit   Chief Complaint  Patient presents with  . Ovarian Cyst    pt does have abdominal pain but it states it comes and goes  Referral from [facility], Dr. Lamonte Sakai for left ovarian cyst  History of Present Illness: 67 y.o. [phone] female seen in referral at the request of Dr. Lamonte Sakai, from [facility] for a finding of a left ovarian cyst. A pelvic ultrasound on 01/14/18 showed a left ovarian cyst that measured 2.7 cm with a septated cystic lesion. There were no reported vascular elements within the septation or elsewhere on Doppler interrogation. The septations measure less than 3 mm in thickness.  The right ovary could not be visualized. The uterus is surgically absent. The patient was undergoing an MRI for back pain in June.  The cyst was noted on this MRI and further characterization was undertaken with ultrasound. The patient had an MRI one year ago and though it was not noted in the report, the radiologist mentions a 2.2 cm cyst on the left ovary in a prior study. The patient notes pain on both sides of her lower abdomen. The right side is worse than the left.  The pain occurs 7-8 times per day.  She describes the pain as having a knife stuck in her.  She rates the pain as 5/10.  The pain does not radiate.  No alleviating factors. No aggravating factors.  She denies associated symptoms.  She denies bowel symptoms. She sometimes has bladder pains, but she states that this is different. Denies hematochezia, hematuria.  She has constipation because she takes percocet for her lower back.  The pain has been present for 10 years and she notes that it is getting worse.  She had a hysterectomy in 1979 due to a fibroid and painful periods.  She had total abdominal hysterectomy.    Past Medical History:  Diagnosis Date  . Absolute anemia 04/11/2015  . Angina pectoris ([place])   . Anxiety   . Arthritis   . 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 Atypical face pain 04/24/2015  . CAD (coronary artery disease)   . Chronic back pain   . Depression   . Fibromyalgia   . GERD (gastroesophageal reflux disease)   . 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) 04/11/2015  . H/O: hysterectomy 1979  . Hyperlipidemia   . Hypertension   . Low back pain 04/06/2015  . Lumbar radicular pain 04/18/2015  . Migraine   . Narrowing of intervertebral disc space 04/11/2015  . Sacroiliac joint pain 04/11/2015  . Spine disorder     Past Surgical History:  Procedure Laterality Date  . ABDOMINAL HYSTERECTOMY    . APPENDECTOMY    . caract surger     02/24/17  . CHOLECYSTECTOMY    . COLONOSCOPY WITH PROPOFOL N/A 07/19/2015   Procedure: COLONOSCOPY WITH PROPOFOL;  Surgeon: Manya Silvas, MD;  Location: [facility] ENDOSCOPY;  Service: Endoscopy;  Laterality: N/A;  . NECK SURGERY    . SHOULDER SURGERY     Gynecologic History: No LMP recorded. Patient has had a hysterectomy.  Obstetric History: [id number]  Family History  Problem Relation Age of Onset  . Diabetes Father   . Hypertension Sister   . Diabetes Mother   . Stroke Mother   . Colon cancer Mother   . Colon cancer Maternal Grandmother   . Ovarian cancer Neg Hx   . Breast cancer Neg Hx     Social  History   Socioeconomic History  . Marital status: Married    Spouse name: Not on file  . Number of children: Not on file  . Years of education: Not on file  . Highest education level: Not on file  Occupational History  . Not on file  Social Needs  . Financial resource strain: Not hard at all  . Food insecurity:    Worry: Patient refused    Inability: Patient refused  . Transportation needs:    Medical: Patient refused    Non-medical: Patient refused  Tobacco Use  . Smoking status: Former Research scientist (life sciences)  . Smokeless tobacco: Never Used  Substance and Sexual Activity  . Alcohol use: No    Alcohol/week: 0.0 standard drinks  . Drug use: No  . Sexual activity: Not Currently    Lifestyle  . Physical activity:    Days per week: Patient refused    Minutes per session: Patient refused  . Stress: Not at all  Relationships  . Social connections:    Talks on phone: Patient refused    Gets together: Patient refused    Attends religious service: Patient refused    Active member of club or organization: Patient refused    Attends meetings of clubs or organizations: Patient refused    Relationship status: Patient refused  . Intimate partner violence:    Fear of current or ex partner: Patient refused    Emotionally abused: Patient refused    Physically abused: Patient refused    Forced sexual activity: Patient refused  Other Topics Concern  . Not on file  Social History Narrative  . Not on file   Allergies: No Known Allergies  Prior to Admission medications   Medication Sig Start Date End Date Taking? Authorizing Provider  acetaminophen (TYLENOL) 325 MG tablet Take by mouth.   Yes [provider]  Cholecalciferol (VITAMIN D3) 50000 units CAPS Take 1 capsule by mouth once a week. 02/11/18  Yes [provider]  cyclobenzaprine (FLEXERIL) 10 MG tablet Take 1 tablet (10 mg total) by mouth 3 (three) times daily as needed for muscle spasms. 12/10/17 03/10/18 Yes Vevelyn Francois, NP  DULoxetine (CYMBALTA) 60 MG capsule Take 60 mg by mouth daily.  05/23/15  Yes [provider]  enalapril (VASOTEC) 5 MG tablet Take 5 mg by mouth daily.   Yes [provider]  esomeprazole (NEXIUM) 20 MG capsule Take 20 mg by mouth daily at 12 noon.   Yes [provider]  fluticasone (FLONASE) 50 MCG/ACT nasal spray 2 sprays nightly. 09/26/16  Yes [provider]  gabapentin (NEURONTIN) 300 MG capsule Take 1 capsule (300 mg total) by mouth 2 (two) times daily. 12/10/17 03/10/18 Yes Vevelyn Francois, NP  Magnesium Oxide, Antacid, 500 MG CAPS Take by mouth.   Yes [provider]  Melatonin 3 MG TABS Take by mouth.   Yes [provider]  oxyCODONE-acetaminophen (PERCOCET) 7.5-325 MG tablet Take 1 tablet by mouth 5 (five) times daily as needed for severe pain. 02/19/18 03/21/18 Yes King, Diona Foley, NP  rosuvastatin (CRESTOR) 5 MG tablet Take 5 mg by mouth every other day.  04/23/16  Yes [provider]  nystatin (MYCOSTATIN) 100000 UNIT/ML suspension TAKE 5 ML BY MOUTH THREE TIMES DAILY FOR 10 DAYS 02/09/18   [provider]  oxyCODONE-acetaminophen (PERCOCET) 7.5-325 MG tablet Take 1 tablet by mouth 5 (five) times daily as needed for severe pain. Patient not taking: Reported on 02/17/2018 01/20/18 02/19/18  Edison Pace,  Crystal M, NP  oxyCODONE-acetaminophen (PERCOCET) 7.5-325 MG tablet Take 1 tablet by mouth 5 (five) times daily as needed for severe pain. 12/21/17 01/20/18  Vevelyn Francois, NP    Review of Systems  Constitutional: Negative.   HENT: Negative.   Eyes: Negative.   Respiratory: Negative.   Cardiovascular: Negative.   Gastrointestinal: Positive for abdominal pain (see HPI) and constipation. Negative for blood in stool, diarrhea, heartburn, melena, nausea and vomiting.  Genitourinary: Negative.   Musculoskeletal: Negative.   Skin: Negative.   Neurological: Negative.   Psychiatric/Behavioral: Negative.      Physical Exam BP 110/60 (BP Location: Left Arm, Patient Position: Sitting)   Pulse 100   Ht 5' (1.524 m)   Wt 142 lb (64.4 kg)   SpO2 96%   BMI 27.73 kg/m  No LMP recorded. Patient has had a hysterectomy. Physical Exam  Constitutional: She is oriented to person, place, and time. She appears well-developed and well-nourished. No distress.  HENT:  Head: Normocephalic and atraumatic.  Eyes: Conjunctivae are normal. No scleral icterus.  Cardiovascular: Normal rate and regular rhythm.  Pulmonary/Chest: Effort normal and breath sounds normal. No stridor. No respiratory distress. She has no wheezes. She has no rales.  Abdominal: Soft. Bowel sounds are normal. She exhibits no distension  and no mass. There is tenderness (mild bilateral lower quadrant tenderness). There is no rebound and no guarding. No hernia (negative for inguinal hernia).  Musculoskeletal: Normal range of motion. She exhibits no edema.  Neurological: She is alert and oriented to person, place, and time. No cranial nerve deficit.  Skin: Skin is warm and dry. No erythema.  Psychiatric: She has a normal mood and affect. Her behavior is normal. Judgment normal.    Female chaperone present for pelvic and breast  portions of the physical exam  Assessment: 66 y.o. G11P2002 female here for  1. Left ovarian cyst      Plan: Problem List Items Addressed This Visit      Genitourinary   Left ovarian cyst - Primary   Relevant Orders   Ovarian Malignancy Risk-ROMA     Discussed evaluation modalities. Overall the cyst has mostly benign characteristics. However, it does appear to have grown slightly and remained present for over a year.  I discussed that I could not give her a definitive diagnosis without removing the ovary surgically.  She greatly desires to have both ovaries removed, if possible.  She has had multiple abdominal surgeries. So, we discussed that there may be a high amount of scar tissue, increasing the risk of the surgery.  Additionally, I discussed that if I did not believe I could safely remove her ovary, if her scar tissue level is high, then I would abandon the surgery and potentially attempt the surgery at a later time with gyn oncology.  She will need surgical clearance prior to proceeding, given her medical history.  Ordered ROMA today and if abnormal, will consult gynecologic oncology prior to performing surgery.  30 minutes spent in face to face discussion with > 50% spent in counseling,management, and coordination of care of her left ovarian cyst.   Prentice Docker, MD 02/17/2018 2:23 PM     CC: Perrin Maltese, [place] [place], [address] [address]

## 2018-02-17 NOTE — H&P (View-Only) (Signed)
Obstetrics & Gynecology Office Visit   Chief Complaint  Patient presents with  . Ovarian Cyst    pt does have abdominal pain but it states it comes and goes  Referral from [facility], Dr. Lamonte Sakai for left ovarian cyst  History of Present Illness: 67 y.o. [phone] female seen in referral at the request of Dr. Lamonte Sakai, from [facility] for a finding of a left ovarian cyst. A pelvic ultrasound on 01/14/18 showed a left ovarian cyst that measured 2.7 cm with a septated cystic lesion. There were no reported vascular elements within the septation or elsewhere on Doppler interrogation. The septations measure less than 3 mm in thickness.  The right ovary could not be visualized. The uterus is surgically absent. The patient was undergoing an MRI for back pain in June.  The cyst was noted on this MRI and further characterization was undertaken with ultrasound. The patient had an MRI one year ago and though it was not noted in the report, the radiologist mentions a 2.2 cm cyst on the left ovary in a prior study. The patient notes pain on both sides of her lower abdomen. The right side is worse than the left.  The pain occurs 7-8 times per day.  She describes the pain as having a knife stuck in her.  She rates the pain as 5/10.  The pain does not radiate.  No alleviating factors. No aggravating factors.  She denies associated symptoms.  She denies bowel symptoms. She sometimes has bladder pains, but she states that this is different. Denies hematochezia, hematuria.  She has constipation because she takes percocet for her lower back.  The pain has been present for 10 years and she notes that it is getting worse.  She had a hysterectomy in 1979 due to a fibroid and painful periods.  She had total abdominal hysterectomy.    Past Medical History:  Diagnosis Date  . Absolute anemia 04/11/2015  . Angina pectoris ([place])   . Anxiety   . Arthritis   . 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 Atypical face pain 04/24/2015  . CAD (coronary artery disease)   . Chronic back pain   . Depression   . Fibromyalgia   . GERD (gastroesophageal reflux disease)   . 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) 04/11/2015  . H/O: hysterectomy 1979  . Hyperlipidemia   . Hypertension   . Low back pain 04/06/2015  . Lumbar radicular pain 04/18/2015  . Migraine   . Narrowing of intervertebral disc space 04/11/2015  . Sacroiliac joint pain 04/11/2015  . Spine disorder     Past Surgical History:  Procedure Laterality Date  . ABDOMINAL HYSTERECTOMY    . APPENDECTOMY    . caract surger     02/24/17  . CHOLECYSTECTOMY    . COLONOSCOPY WITH PROPOFOL N/A 07/19/2015   Procedure: COLONOSCOPY WITH PROPOFOL;  Surgeon: Manya Silvas, MD;  Location: [facility] ENDOSCOPY;  Service: Endoscopy;  Laterality: N/A;  . NECK SURGERY    . SHOULDER SURGERY     Gynecologic History: No LMP recorded. Patient has had a hysterectomy.  Obstetric History: [id number]  Family History  Problem Relation Age of Onset  . Diabetes Father   . Hypertension Sister   . Diabetes Mother   . Stroke Mother   . Colon cancer Mother   . Colon cancer Maternal Grandmother   . Ovarian cancer Neg Hx   . Breast cancer Neg Hx     Social  History   Socioeconomic History  . Marital status: Married    Spouse name: Not on file  . Number of children: Not on file  . Years of education: Not on file  . Highest education level: Not on file  Occupational History  . Not on file  Social Needs  . Financial resource strain: Not hard at all  . Food insecurity:    Worry: Patient refused    Inability: Patient refused  . Transportation needs:    Medical: Patient refused    Non-medical: Patient refused  Tobacco Use  . Smoking status: Former Research scientist (life sciences)  . Smokeless tobacco: Never Used  Substance and Sexual Activity  . Alcohol use: No    Alcohol/week: 0.0 standard drinks  . Drug use: No  . Sexual activity: Not Currently    Lifestyle  . Physical activity:    Days per week: Patient refused    Minutes per session: Patient refused  . Stress: Not at all  Relationships  . Social connections:    Talks on phone: Patient refused    Gets together: Patient refused    Attends religious service: Patient refused    Active member of club or organization: Patient refused    Attends meetings of clubs or organizations: Patient refused    Relationship status: Patient refused  . Intimate partner violence:    Fear of current or ex partner: Patient refused    Emotionally abused: Patient refused    Physically abused: Patient refused    Forced sexual activity: Patient refused  Other Topics Concern  . Not on file  Social History Narrative  . Not on file   Allergies: No Known Allergies  Prior to Admission medications   Medication Sig Start Date End Date Taking? Authorizing Provider  acetaminophen (TYLENOL) 325 MG tablet Take by mouth.   Yes [provider]  Cholecalciferol (VITAMIN D3) 50000 units CAPS Take 1 capsule by mouth once a week. 02/11/18  Yes [provider]  cyclobenzaprine (FLEXERIL) 10 MG tablet Take 1 tablet (10 mg total) by mouth 3 (three) times daily as needed for muscle spasms. 12/10/17 03/10/18 Yes Vevelyn Francois, NP  DULoxetine (CYMBALTA) 60 MG capsule Take 60 mg by mouth daily.  05/23/15  Yes [provider]  enalapril (VASOTEC) 5 MG tablet Take 5 mg by mouth daily.   Yes [provider]  esomeprazole (NEXIUM) 20 MG capsule Take 20 mg by mouth daily at 12 noon.   Yes [provider]  fluticasone (FLONASE) 50 MCG/ACT nasal spray 2 sprays nightly. 09/26/16  Yes [provider]  gabapentin (NEURONTIN) 300 MG capsule Take 1 capsule (300 mg total) by mouth 2 (two) times daily. 12/10/17 03/10/18 Yes Vevelyn Francois, NP  Magnesium Oxide, Antacid, 500 MG CAPS Take by mouth.   Yes [provider]  Melatonin 3 MG TABS Take by mouth.   Yes [provider]  oxyCODONE-acetaminophen (PERCOCET) 7.5-325 MG tablet Take 1 tablet by mouth 5 (five) times daily as needed for severe pain. 02/19/18 03/21/18 Yes King, Diona Foley, NP  rosuvastatin (CRESTOR) 5 MG tablet Take 5 mg by mouth every other day.  04/23/16  Yes [provider]  nystatin (MYCOSTATIN) 100000 UNIT/ML suspension TAKE 5 ML BY MOUTH THREE TIMES DAILY FOR 10 DAYS 02/09/18   [provider]  oxyCODONE-acetaminophen (PERCOCET) 7.5-325 MG tablet Take 1 tablet by mouth 5 (five) times daily as needed for severe pain. Patient not taking: Reported on 02/17/2018 01/20/18 02/19/18  Edison Pace,  Crystal M, NP  oxyCODONE-acetaminophen (PERCOCET) 7.5-325 MG tablet Take 1 tablet by mouth 5 (five) times daily as needed for severe pain. 12/21/17 01/20/18  Vevelyn Francois, NP    Review of Systems  Constitutional: Negative.   HENT: Negative.   Eyes: Negative.   Respiratory: Negative.   Cardiovascular: Negative.   Gastrointestinal: Positive for abdominal pain (see HPI) and constipation. Negative for blood in stool, diarrhea, heartburn, melena, nausea and vomiting.  Genitourinary: Negative.   Musculoskeletal: Negative.   Skin: Negative.   Neurological: Negative.   Psychiatric/Behavioral: Negative.      Physical Exam BP 110/60 (BP Location: Left Arm, Patient Position: Sitting)   Pulse 100   Ht 5' (1.524 m)   Wt 142 lb (64.4 kg)   SpO2 96%   BMI 27.73 kg/m  No LMP recorded. Patient has had a hysterectomy. Physical Exam  Constitutional: She is oriented to person, place, and time. She appears well-developed and well-nourished. No distress.  HENT:  Head: Normocephalic and atraumatic.  Eyes: Conjunctivae are normal. No scleral icterus.  Cardiovascular: Normal rate and regular rhythm.  Pulmonary/Chest: Effort normal and breath sounds normal. No stridor. No respiratory distress. She has no wheezes. She has no rales.  Abdominal: Soft. Bowel sounds are normal. She exhibits no distension  and no mass. There is tenderness (mild bilateral lower quadrant tenderness). There is no rebound and no guarding. No hernia (negative for inguinal hernia).  Musculoskeletal: Normal range of motion. She exhibits no edema.  Neurological: She is alert and oriented to person, place, and time. No cranial nerve deficit.  Skin: Skin is warm and dry. No erythema.  Psychiatric: She has a normal mood and affect. Her behavior is normal. Judgment normal.    Female chaperone present for pelvic and breast  portions of the physical exam  Assessment: 67 y.o. G32P2002 female here for  1. Left ovarian cyst      Plan: Problem List Items Addressed This Visit      Genitourinary   Left ovarian cyst - Primary   Relevant Orders   Ovarian Malignancy Risk-ROMA     Discussed evaluation modalities. Overall the cyst has mostly benign characteristics. However, it does appear to have grown slightly and remained present for over a year.  I discussed that I could not give her a definitive diagnosis without removing the ovary surgically.  She greatly desires to have both ovaries removed, if possible.  She has had multiple abdominal surgeries. So, we discussed that there may be a high amount of scar tissue, increasing the risk of the surgery.  Additionally, I discussed that if I did not believe I could safely remove her ovary, if her scar tissue level is high, then I would abandon the surgery and potentially attempt the surgery at a later time with gyn oncology.  She will need surgical clearance prior to proceeding, given her medical history.  Ordered ROMA today and if abnormal, will consult gynecologic oncology prior to performing surgery.  30 minutes spent in face to face discussion with > 50% spent in counseling,management, and coordination of care of her left ovarian cyst.   Prentice Docker, MD 02/17/2018 2:23 PM     CC: Perrin Maltese, [place], [place] [address]

## 2018-02-18 ENCOUNTER — Telehealth: Payer: Self-pay | Admitting: Obstetrics and Gynecology

## 2018-02-18 LAB — OVARIAN MALIGNANCY RISK-ROMA
Cancer Antigen (CA) 125: 6.7 U/mL (ref 0.0–38.1)
HE4: 74.7 pmol/L (ref 0.0–96.5)
Postmenopausal ROMA: 0.99
Premenopausal ROMA: 1.66 — ABNORMAL HIGH

## 2018-02-18 LAB — PREMENOPAUSAL INTERP: HIGH

## 2018-02-18 LAB — POSTMENOPAUSAL INTERP: LOW

## 2018-02-18 NOTE — Telephone Encounter (Signed)
Patient is aware of H&P at [facility] on Monday, 02/23/18 @ 1:50pm w / Dr. Glennon Mac, Pre-admit Testing to be scheduled, and OR on 03/03/18. Patient is aware she may receive calls from the [facility]. Patient confirmed Medicare and no secondary insurance. Patient is aware Dr. Glennon Mac requested medical clearance, and patient will call Dr. Humphrey Rolls for an appointment. Ext given.

## 2018-02-18 NOTE — Telephone Encounter (Signed)
-----   Message from Will Bonnet, MD sent at 02/17/2018  2:25 PM EDT ----- Regarding: Schedule surgery Surgery Booking Request Patient Full Name:  AMARYLIS ROVITO  MRN: [id number]  DOB: 03-26-1951  Surgeon: Prentice Docker, MD  Requested Surgery Date and Time: Laparoscopic bilateral salpingo-oophorectomy Primary Diagnosis AND Code: complex left ovarian cyst Secondary Diagnosis and Code:  Surgical Procedure: laparoscopic bilateral salpingo-oophorectomy L&D Notification: No Admission Status: same day surgery Length of Surgery: 60 minutes Special Case Needs: none H&P: TBD (date) Phone Interview???: no Interpreter: Language:  Medical Clearance: yes (Please have Dr. Humphrey Rolls medically clear patient for procedure) Special Scheduling Instructions: request second MD assist

## 2018-02-23 ENCOUNTER — Encounter: Payer: Medicare Other | Admitting: Obstetrics and Gynecology

## 2018-02-23 ENCOUNTER — Inpatient Hospital Stay: Admission: RE | Admit: 2018-02-23 | Payer: Medicare Other | Source: Ambulatory Visit

## 2018-02-24 NOTE — Progress Notes (Signed)
Patient's Name: Jacqueline Thornton  MRN: [id number]  Referring Provider: Lavera Guise, MD  DOB: [birth year]  PCP: Lavera Guise, MD  DOS: 02/25/2018  Note by: Gaspar Cola, MD  Service setting: Ambulatory outpatient  Specialty: Interventional Pain Management  Location: [facility] (AMB) Pain Management Facility    Patient type: Established   Primary Reason(s) for Visit: Encounter for post-procedure evaluation of chronic illness with mild to moderate exacerbation CC: Back Pain (lower right)  HPI  [name] is a 67 y.o. year old, female patient, who comes today for a post-procedure evaluation. She has Chronic pain syndrome; Anemia; Gout; Hyperlipidemia; Headache, migraine; Panic attack; Chronic low back pain (Secondary source of pain) (Bilateral) (midline to tailbone) (R>L); Spondylosis of lumbar spine; Lumbar annular disc tear (L4-5); Discogenic low back pain (L3-4 and L4-5); Lumbar facet hypertrophy; Lumbar facet syndrome (Bilateral) (R>L); Chronic neck pain; Cervical spondylosis; Hx of cervical spine surgery; Cervical spinal fusion (C6-7 interbody fusion); Coronary atherosclerosis; Long term current use of opiate analgesic; Encounter for therapeutic drug level monitoring; Uncomplicated opioid dependence ([place]); Opiate use (56.25 MME/Day); CAD in native artery; Anxiety, generalized; Chronic sacroiliac joint pain (Bilateral) (L>R); Chronic lumbar radicular pain (Primary Source of Pain) (Left) (S1 Dermatome); Long term prescription opiate use; Encounter for chronic pain management; Trochanteric bursitis of right hip; Chronic hip pain (Bilateral) (L>R); Neurogenic pain; Myofascial pain; Osteoarthritis of hip (Left); Arthrodesis status; Degenerative disk disease; Myalgia; Trochanteric bursitis of hip (Bilateral) (L>R); Age-related nuclear cataract of left eye; Posterior subcapsular polar age-related cataract of left eye; Pseudophakia of right eye; Pseudophakia of left eye; Spondylosis without myelopathy or  radiculopathy, lumbosacral region; Spondylosis without myelopathy or radiculopathy, sacral and sacrococcygeal region; Other specified dorsopathies, sacral and sacrococcygeal region; Age-related osteoporosis without current pathological fracture; Bone island of right femur; Inflammatory spondylopathy of lumbar region [facility]); Left ovarian cyst; Vitamin B12 deficiency anemia; Vitamin B12 deficiency; Trigger point with back pain (Right); and Fibromyalgia on their problem list. Her primarily concern today is the Back Pain (lower right)  Pain Assessment: Location: Lower, Right Back Radiating: denies  Onset: More than a month ago Duration: Chronic pain Quality: Constant, Discomfort(like having a knife in her back) Severity: 2 /10 (subjective, self-reported pain score)  Note: Reported level is compatible with observation.                         When using our objective Pain Scale, levels between 6 and 10/10 are said to belong in an emergency room, as it progressively worsens from a 6/10, described as severely limiting, requiring emergency care not usually available at an outpatient pain management facility. At a 6/10 level, communication becomes difficult and requires great effort. Assistance to reach the emergency department may be required. Facial flushing and profuse sweating along with potentially dangerous increases in heart rate and blood pressure will be evident. Effect on ADL: increased activity after procedure created issue with trigger point area on the right  Timing: Constant Modifying factors: tylenol, heat and ice  BP: 124/61  HR: 85  [name] comes in today for post-procedure evaluation after the treatment done on 02/04/2018.  Further details on both, my assessment(s), as well as the proposed treatment plan, please see below.  Post-Procedure Assessment  02/03/2018 Procedure: Diagnostic bilateral lumbar facet block #1under fluroscopic guidance and IV sedation Pre-procedure pain score:   2/10 Post-procedure pain score: 2/10 No relief Influential Factors: BMI: 26.70 kg/m Intra-procedural challenges: None observed.  Assessment challenges: None detected.              Reported side-effects: None.        Post-procedural adverse reactions or complications: None reported         Sedation: Sedation provided. When no sedatives are used, the analgesic levels obtained are directly associated to the effectiveness of the local anesthetics. However, when sedation is provided, the level of analgesia obtained during the initial 1 hour following the intervention, is believed to be the result of a combination of factors. These factors may include, but are not limited to: 1. The effectiveness of the local anesthetics used. 2. The effects of the analgesic(s) and/or anxiolytic(s) used. 3. The degree of discomfort experienced by the patient at the time of the procedure. 4. The patients ability and reliability in recalling and recording the events. 5. The presence and influence of possible secondary gains and/or psychosocial factors. Reported result: Relief experienced during the 1st hour after the procedure: 100 % (Ultra-Short Term Relief) [name] has indicated area to have been numb during this time. Interpretative annotation: Clinically appropriate result. Analgesia during this period is likely to be Local Anesthetic and/or IV Sedative (Analgesic/Anxiolytic) related.          Effects of local anesthetic: The analgesic effects attained during this period are directly associated to the localized infiltration of local anesthetics and therefore cary significant diagnostic value as to the etiological location, or anatomical origin, of the pain. Expected duration of relief is directly dependent on the pharmacodynamics of the local anesthetic used. Long-acting (4-6 hours) anesthetics used.  Reported result: Relief during the next 4 to 6 hour after the procedure: 90 % (Short-Term Relief)             Interpretative annotation: Clinically appropriate result. Analgesia during this period is likely to be Local Anesthetic-related.          Long-term benefit: Defined as the period of time past the expected duration of local anesthetics (1 hour for short-acting and 4-6 hours for long-acting). With the possible exception of prolonged sympathetic blockade from the local anesthetics, benefits during this period are typically attributed to, or associated with, other factors such as analgesic sensory neuropraxia, antiinflammatory effects, or beneficial biochemical changes provided by agents other than the local anesthetics.  Reported result: Extended relief following procedure: 70 %(states leg pain is better but center of back continues to have pain. ) (Long-Term Relief)            Interpretative annotation: Clinically possible results. Good relief. No permanent benefit expected. Inflammation plays a part in the etiology to the pain.          Current benefits: Defined as reported results that persistent at this point in time.   Analgesia: <75 % Ms. Boyers reports improvement of axial symptoms. Function: Ms. Witters reports improvement in function ROM: Ms. Ficek reports improvement in ROM Interpretative annotation: Ongoing benefit. Therapeutic success. Effective palliative intervention.          Interpretation: Results would suggest a successful diagnostic intervention.                  Plan:  Set up procedure as a PRN palliative treatment option for this patient.                Laboratory Chemistry  Inflammation Markers (CRP: Acute Phase) (ESR: Chronic Phase) Lab Results  Component Value Date   CRP 4.8 12/05/2016   ESRSEDRATE 2 12/05/2016  Renal Markers Lab Results  Component Value Date   BUN 8 12/05/2016   CREATININE 0.88 12/05/2016   BCR 9 (L) 12/05/2016   GFRAA 80 12/05/2016   GFRNONAA 70 12/05/2016                             Hepatic Markers Lab Results   Component Value Date   AST 19 12/05/2016   ALT 13 12/05/2016   ALBUMIN 3.7 12/05/2016                        Neuropathy Markers Lab Results  Component Value Date   VITAMINB12 202 (L) 12/05/2016                        Hematology Parameters Lab Results  Component Value Date   INR 1.0 03/02/2014   LABPROT 12.7 03/02/2014   PLT 197 01/26/2012   HGB 14.2 01/26/2012   HCT 41.1 01/26/2012                        CV Markers No results found for: BNP, CKTOTAL, CKMB, TROPONINI                       Note: Lab results reviewed.  Recent Imaging Results   Results for orders placed in visit on 02/03/18  DG C-Arm 1-60 Min-No Report   Narrative Fluoroscopy was utilized by the requesting physician.  No radiographic  interpretation.    Interpretation Report: Fluoroscopy was used during the procedure to assist with needle guidance. The images were interpreted intraoperatively by the requesting physician.  Meds   Current Outpatient Medications:  .  Cholecalciferol (VITAMIN D3) 50000 units CAPS, Take 50,000 Units by mouth every Wednesday. , Disp: , Rfl: 3 .  cyclobenzaprine (FLEXERIL) 10 MG tablet, Take 1 tablet (10 mg total) by mouth 3 (three) times daily as needed for muscle spasms., Disp: 270 tablet, Rfl: 0 .  DULoxetine (CYMBALTA) 60 MG capsule, Take 60 mg by mouth at bedtime. , Disp: , Rfl:  .  enalapril (VASOTEC) 5 MG tablet, Take 5 mg by mouth daily., Disp: , Rfl:  .  esomeprazole (NEXIUM) 20 MG capsule, Take 20 mg by mouth daily at 12 noon., Disp: , Rfl:  .  estradiol (ESTRACE) 0.5 MG tablet, Take 0.5 mg by mouth daily., Disp: , Rfl:  .  gabapentin (NEURONTIN) 300 MG capsule, Take 1 capsule (300 mg total) by mouth 2 (two) times daily., Disp: 180 capsule, Rfl: 0 .  Melatonin 3 MG TABS, Take 3 mg by mouth at bedtime as needed (for sleep). , Disp: , Rfl:  .  oxyCODONE-acetaminophen (PERCOCET) 7.5-325 MG tablet, Take 1 tablet by mouth 5 (five) times daily as needed for severe pain.,  Disp: 150 tablet, Rfl: 0 .  rosuvastatin (CRESTOR) 5 MG tablet, Take 5 mg by mouth daily. , Disp: , Rfl:  .  Cyanocobalamin (VITAMIN B-12) 5000 MCG SUBL, Place 1 tablet (5,000 mcg total) under the tongue daily., Disp: 180 tablet, Rfl: 1 .  oxyCODONE-acetaminophen (PERCOCET) 7.5-325 MG tablet, Take 1 tablet by mouth 5 (five) times daily as needed for severe pain., Disp: 150 tablet, Rfl: 0 .  oxyCODONE-acetaminophen (PERCOCET) 7.5-325 MG tablet, Take 1 tablet by mouth 5 (five) times daily as needed for severe pain., Disp: 150 tablet, Rfl: 0  ROS  Constitutional: Denies any  fever or chills Gastrointestinal: No reported hemesis, hematochezia, vomiting, or acute GI distress Musculoskeletal: Denies any acute onset joint swelling, redness, loss of ROM, or weakness Neurological: No reported episodes of acute onset apraxia, aphasia, dysarthria, agnosia, amnesia, paralysis, loss of coordination, or loss of consciousness  Allergies  Ms. Apgar has No Known Allergies.  [place]  Drug: Ms. Bottoms  reports that she does not use drugs. Alcohol:  reports that she does not drink alcohol. Tobacco:  reports that she has quit smoking. She has never used smokeless tobacco. Medical:  has a past medical history of Absolute anemia (04/11/2015), Acute postoperative pain (08/07/2017), Angina pectoris ([place]), Anxiety, Arthritis, Arthropathy of sacroiliac joint (04/11/2015), Atypical face pain (04/24/2015), CAD (coronary artery disease), Chronic back pain, Depression, Fibromyalgia, GERD (gastroesophageal reflux disease), 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) (04/11/2015), H/O: hysterectomy (1979), Hyperlipidemia, Hypertension, Low back pain (04/06/2015), Lumbar radicular pain (04/18/2015), Migraine, Narrowing of intervertebral disc space (04/11/2015), Sacroiliac joint pain (04/11/2015), and Spine disorder. Surgical: Ms. Gladhill  has a past surgical history that includes Cholecystectomy; Abdominal hysterectomy; Appendectomy;  Neck surgery; Shoulder surgery; Colonoscopy with propofol (N/A, 07/19/2015); and caract surger. Family: family history includes Colon cancer in her maternal grandmother and mother; Diabetes in her father and mother; Hypertension in her sister; Stroke in her mother.  Constitutional Exam  General appearance: Well nourished, well developed, and well hydrated. In no apparent acute distress Vitals:   02/25/18 1109  BP: 124/61  Pulse: 85  Resp: 16  Temp: 98.7 F (37.1 C)  TempSrc: Oral  SpO2: 100%  Weight: 146 lb (66.2 kg)  Height: _0  (1.575 m)   BMI Assessment: Estimated body mass index is 26.7 kg/m as calculated from the following:   Height as of this encounter: _1  (1.575 m).   Weight as of this encounter: 146 lb (66.2 kg).  BMI interpretation table: BMI level Category Range association with higher incidence of chronic pain  <18 kg/m2 Underweight   18.5-24.9 kg/m2 Ideal body weight   25-29.9 kg/m2 Overweight Increased incidence by 20%  30-34.9 kg/m2 Obese (Class I) Increased incidence by 68%  35-39.9 kg/m2 Severe obesity (Class II) Increased incidence by 136%  >40 kg/m2 Extreme obesity (Class III) Increased incidence by 254%   Patient's current BMI Ideal Body weight  Body mass index is 26.7 kg/m. Ideal body weight: 50.1 kg (110 lb 7.2 oz) Adjusted ideal body weight: 56.5 kg (124 lb 10.7 oz)   BMI Readings from Last 4 Encounters:  02/25/18 26.70 kg/m  02/17/18 27.73 kg/m  02/03/18 29.10 kg/m  12/10/17 28.62 kg/m   Wt Readings from Last 4 Encounters:  02/25/18 146 lb (66.2 kg)  02/17/18 142 lb (64.4 kg)  02/03/18 149 lb (67.6 kg)  12/10/17 149 lb (67.6 kg)  Psych/Mental status: Alert, oriented x 3 (person, place, & time)       Eyes: PERLA Respiratory: No evidence of acute respiratory distress  Cervical Spine Area Exam  Skin & Axial Inspection: No masses, redness, edema, swelling, or associated skin lesions Alignment: Symmetrical Functional ROM: Unrestricted  ROM      Stability: No instability detected Muscle Tone/Strength: Functionally intact. No obvious neuro-muscular anomalies detected. Sensory (Neurological): Unimpaired Palpation: No palpable anomalies              Upper Extremity (UE) Exam    Side: Right upper extremity  Side: Left upper extremity  Skin & Extremity Inspection: Skin color, temperature, and hair growth are WNL. No peripheral edema or cyanosis. No masses, redness, swelling,  asymmetry, or associated skin lesions. No contractures.  Skin & Extremity Inspection: Skin color, temperature, and hair growth are WNL. No peripheral edema or cyanosis. No masses, redness, swelling, asymmetry, or associated skin lesions. No contractures.  Functional ROM: Unrestricted ROM          Functional ROM: Unrestricted ROM          Muscle Tone/Strength: Functionally intact. No obvious neuro-muscular anomalies detected.  Muscle Tone/Strength: Functionally intact. No obvious neuro-muscular anomalies detected.  Sensory (Neurological): Unimpaired          Sensory (Neurological): Unimpaired          Palpation: No palpable anomalies              Palpation: No palpable anomalies              Provocative Test(s):  Phalen's test: deferred Tinel's test: deferred Apley's scratch test (touch opposite shoulder):  Action 1 (Across chest): deferred Action 2 (Overhead): deferred Action 3 (LB reach): deferred   Provocative Test(s):  Phalen's test: deferred Tinel's test: deferred Apley's scratch test (touch opposite shoulder):  Action 1 (Across chest): deferred Action 2 (Overhead): deferred Action 3 (LB reach): deferred    Thoracic Spine Area Exam  Skin & Axial Inspection: No masses, redness, or swelling Alignment: Symmetrical Functional ROM: Unrestricted ROM Stability: No instability detected Muscle Tone/Strength: Functionally intact. No obvious neuro-muscular anomalies detected. Sensory (Neurological): Unimpaired Muscle strength & Tone: No palpable  anomalies  Lumbar Spine Area Exam  Skin & Axial Inspection: No masses, redness, or swelling Alignment: Symmetrical Functional ROM: Improved after treatment       Stability: No instability detected Muscle Tone/Strength: Functionally intact. No obvious neuro-muscular anomalies detected. Sensory (Neurological): Unimpaired Palpation: No palpable anomalies       Provocative Tests: Hyperextension/rotation test: deferred today       Lumbar quadrant test (Kemp's test): deferred today       Lateral bending test: deferred today       Patrick's Maneuver: deferred today                   FABER test: deferred today                   S-I anterior distraction/compression test: deferred today         S-I lateral compression test: deferred today         S-I Thigh-thrust test: deferred today         S-I Gaenslen's test: deferred today          Gait & Posture Assessment  Ambulation: Unassisted Gait: Relatively normal for age and body habitus Posture: WNL   Lower Extremity Exam    Side: Right lower extremity  Side: Left lower extremity  Stability: No instability observed          Stability: No instability observed          Skin & Extremity Inspection: Skin color, temperature, and hair growth are WNL. No peripheral edema or cyanosis. No masses, redness, swelling, asymmetry, or associated skin lesions. No contractures.  Skin & Extremity Inspection: Skin color, temperature, and hair growth are WNL. No peripheral edema or cyanosis. No masses, redness, swelling, asymmetry, or associated skin lesions. No contractures.  Functional ROM: Unrestricted ROM                  Functional ROM: Unrestricted ROM  Muscle Tone/Strength: Functionally intact. No obvious neuro-muscular anomalies detected.  Muscle Tone/Strength: Functionally intact. No obvious neuro-muscular anomalies detected.  Sensory (Neurological): Unimpaired  Sensory (Neurological): Unimpaired  Palpation: No palpable anomalies   Palpation: No palpable anomalies   Assessment  Primary Diagnosis & Pertinent Problem List: The primary encounter diagnosis was Chronic low back pain (Secondary source of pain) (Bilateral) (midline to tailbone) (R>L). Diagnoses of Chronic lumbar radicular pain (Primary Source of Pain) (Left) (S1 Dermatome), Chronic hip pain (Bilateral) (L>R), Fibromyalgia, Trigger point with back pain (Right), Anemia due to vitamin B12 deficiency, unspecified B12 deficiency type, and Vitamin B12 deficiency were also pertinent to this visit.  Status Diagnosis  Improved Improved Improved 1. Chronic low back pain (Secondary source of pain) (Bilateral) (midline to tailbone) (R>L)   2. Chronic lumbar radicular pain (Primary Source of Pain) (Left) (S1 Dermatome)   3. Chronic hip pain (Bilateral) (L>R)   4. Fibromyalgia   5. Trigger point with back pain (Right)   6. Anemia due to vitamin B12 deficiency, unspecified B12 deficiency type   7. Vitamin B12 deficiency     Problems updated and reviewed during this visit: Problem  Trigger point with back pain (Right)  Fibromyalgia  Inflammatory Spondylopathy of Lumbar Region ([facility])  Bone Island of Right Femur  Age-Related Osteoporosis Without Current Pathological Fracture  Vitamin B12 Deficiency Anemia  Vitamin B12 Deficiency  Left Ovarian Cyst  Pseudophakia of Left Eye  Anemia  Acute Postoperative Pain (Resolved)   Plan of Care  Pharmacotherapy (Medications Ordered): Meds ordered this encounter  Medications  . Cyanocobalamin (VITAMIN B-12) 5000 MCG SUBL    Sig: Place 1 tablet (5,000 mcg total) under the tongue daily.    Dispense:  180 tablet    Refill:  1    Do not place medication on "Automatic Refill". Fill one day early if pharmacy is closed on scheduled refill date.   Medications administered today: Santos L. Oltmann had no medications administered during this visit.   Procedure Orders     TRIGGER POINT INJECTION Lab Orders  No laboratory test(s)  ordered today   Imaging Orders  No imaging studies ordered today   Referral Orders  No referral(s) requested today    Interventional management options: Planned, scheduled, and/or pending:   Therapeutic right lower back TPI, no fluoro or IV sedation   Considering:   Diagnostic caudal epidural steroid injection + diagnostic epidurogram Palliative Right sided lumbar facet radiofrequency ablation + Left S-I Block Diagnostic cervical epidural steroid injection Diagnostic bilateral cervical facet block Possible bilateral cervical facet radiofrequencyablation  Diagnostic bilateral lumbar facet block Possible bilateral lumbar facet radiofrequencyablation  Diagnostic left L4-5 lumbar epidural steroid injection  Diagnostic left S1 selective nerve root block Diagnostic bilateral sacroiliac joint block Possible bilateral sacroiliac joint radiofrequencyablation  Diagnostic right trochanteric bursa injection   Palliative PRN treatment(s):   Palliativebilateral lumbar facet block+ bilateral sacroiliac joint block # 6  Palliativecervical epidural steroid injection  Palliativebilateral cervical facet block Palliativeleft L4-5 lumbar epiduralsteroid injection  Palliativeleft caudal epidural steroid injection  Palliativeleft S1 selective nerve root block Palliativebilateral sacroiliac joint block Palliativeright trochanteric bursa injection   Provider-requested follow-up: Return for Med-Mgmt, w/ Dionisio David, NP.  Future Appointments  Date Time Provider [place]  03/10/2018  1:30 PM Vevelyn Francois, NP [facility] None  03/24/2018 10:00 AM [facility]   Primary Care Physician: Lavera Guise, MD Location: [facility] Note by: Gaspar Cola, MD Date: 02/25/2018; Time: 7:23 PM

## 2018-02-25 ENCOUNTER — Ambulatory Visit: Payer: Medicare Other | Attending: Pain Medicine | Admitting: Pain Medicine

## 2018-02-25 ENCOUNTER — Telehealth: Payer: Self-pay | Admitting: Obstetrics and Gynecology

## 2018-02-25 ENCOUNTER — Encounter: Payer: Self-pay | Admitting: Pain Medicine

## 2018-02-25 VITALS — BP 124/61 | HR 85 | Temp 98.7°F | Resp 16 | Ht 62.0 in | Wt 146.0 lb

## 2018-02-25 DIAGNOSIS — M7061 Trochanteric bursitis, right hip: Secondary | ICD-10-CM | POA: Insufficient documentation

## 2018-02-25 DIAGNOSIS — G894 Chronic pain syndrome: Secondary | ICD-10-CM | POA: Diagnosis not present

## 2018-02-25 DIAGNOSIS — F411 Generalized anxiety disorder: Secondary | ICD-10-CM | POA: Insufficient documentation

## 2018-02-25 DIAGNOSIS — M533 Sacrococcygeal disorders, not elsewhere classified: Secondary | ICD-10-CM | POA: Insufficient documentation

## 2018-02-25 DIAGNOSIS — M4726 Other spondylosis with radiculopathy, lumbar region: Secondary | ICD-10-CM | POA: Diagnosis not present

## 2018-02-25 DIAGNOSIS — M25551 Pain in right hip: Secondary | ICD-10-CM

## 2018-02-25 DIAGNOSIS — D649 Anemia, unspecified: Secondary | ICD-10-CM | POA: Diagnosis not present

## 2018-02-25 DIAGNOSIS — E785 Hyperlipidemia, unspecified: Secondary | ICD-10-CM | POA: Diagnosis not present

## 2018-02-25 DIAGNOSIS — M5416 Radiculopathy, lumbar region: Secondary | ICD-10-CM | POA: Diagnosis not present

## 2018-02-25 DIAGNOSIS — Z823 Family history of stroke: Secondary | ICD-10-CM | POA: Insufficient documentation

## 2018-02-25 DIAGNOSIS — Z87891 Personal history of nicotine dependence: Secondary | ICD-10-CM | POA: Diagnosis not present

## 2018-02-25 DIAGNOSIS — M8938 Hypertrophy of bone, other site: Secondary | ICD-10-CM | POA: Insufficient documentation

## 2018-02-25 DIAGNOSIS — I10 Essential (primary) hypertension: Secondary | ICD-10-CM | POA: Insufficient documentation

## 2018-02-25 DIAGNOSIS — Z961 Presence of intraocular lens: Secondary | ICD-10-CM | POA: Insufficient documentation

## 2018-02-25 DIAGNOSIS — Z833 Family history of diabetes mellitus: Secondary | ICD-10-CM | POA: Insufficient documentation

## 2018-02-25 DIAGNOSIS — M1612 Unilateral primary osteoarthritis, left hip: Secondary | ICD-10-CM | POA: Insufficient documentation

## 2018-02-25 DIAGNOSIS — Z79899 Other long term (current) drug therapy: Secondary | ICD-10-CM | POA: Insufficient documentation

## 2018-02-25 DIAGNOSIS — M797 Fibromyalgia: Secondary | ICD-10-CM | POA: Diagnosis not present

## 2018-02-25 DIAGNOSIS — M545 Low back pain, unspecified: Secondary | ICD-10-CM

## 2018-02-25 DIAGNOSIS — M109 Gout, unspecified: Secondary | ICD-10-CM | POA: Insufficient documentation

## 2018-02-25 DIAGNOSIS — M47897 Other spondylosis, lumbosacral region: Secondary | ICD-10-CM | POA: Diagnosis not present

## 2018-02-25 DIAGNOSIS — Z5181 Encounter for therapeutic drug level monitoring: Secondary | ICD-10-CM | POA: Insufficient documentation

## 2018-02-25 DIAGNOSIS — M549 Dorsalgia, unspecified: Secondary | ICD-10-CM | POA: Insufficient documentation

## 2018-02-25 DIAGNOSIS — G43909 Migraine, unspecified, not intractable, without status migrainosus: Secondary | ICD-10-CM | POA: Insufficient documentation

## 2018-02-25 DIAGNOSIS — Z8249 Family history of ischemic heart disease and other diseases of the circulatory system: Secondary | ICD-10-CM | POA: Insufficient documentation

## 2018-02-25 DIAGNOSIS — K219 Gastro-esophageal reflux disease without esophagitis: Secondary | ICD-10-CM | POA: Insufficient documentation

## 2018-02-25 DIAGNOSIS — M4686 Other specified inflammatory spondylopathies, lumbar region: Secondary | ICD-10-CM | POA: Insufficient documentation

## 2018-02-25 DIAGNOSIS — E538 Deficiency of other specified B group vitamins: Secondary | ICD-10-CM

## 2018-02-25 DIAGNOSIS — D519 Vitamin B12 deficiency anemia, unspecified: Secondary | ICD-10-CM | POA: Diagnosis not present

## 2018-02-25 DIAGNOSIS — N83202 Unspecified ovarian cyst, left side: Secondary | ICD-10-CM | POA: Insufficient documentation

## 2018-02-25 DIAGNOSIS — M810 Age-related osteoporosis without current pathological fracture: Secondary | ICD-10-CM | POA: Diagnosis not present

## 2018-02-25 DIAGNOSIS — M5388 Other specified dorsopathies, sacral and sacrococcygeal region: Secondary | ICD-10-CM | POA: Insufficient documentation

## 2018-02-25 DIAGNOSIS — Z9049 Acquired absence of other specified parts of digestive tract: Secondary | ICD-10-CM | POA: Insufficient documentation

## 2018-02-25 DIAGNOSIS — H25812 Combined forms of age-related cataract, left eye: Secondary | ICD-10-CM | POA: Insufficient documentation

## 2018-02-25 DIAGNOSIS — Z79891 Long term (current) use of opiate analgesic: Secondary | ICD-10-CM | POA: Insufficient documentation

## 2018-02-25 DIAGNOSIS — M47892 Other spondylosis, cervical region: Secondary | ICD-10-CM | POA: Insufficient documentation

## 2018-02-25 DIAGNOSIS — Z9841 Cataract extraction status, right eye: Secondary | ICD-10-CM | POA: Insufficient documentation

## 2018-02-25 DIAGNOSIS — Z981 Arthrodesis status: Secondary | ICD-10-CM | POA: Diagnosis not present

## 2018-02-25 DIAGNOSIS — G8929 Other chronic pain: Secondary | ICD-10-CM

## 2018-02-25 DIAGNOSIS — F329 Major depressive disorder, single episode, unspecified: Secondary | ICD-10-CM | POA: Diagnosis not present

## 2018-02-25 DIAGNOSIS — M25552 Pain in left hip: Secondary | ICD-10-CM

## 2018-02-25 DIAGNOSIS — Z90710 Acquired absence of both cervix and uterus: Secondary | ICD-10-CM | POA: Insufficient documentation

## 2018-02-25 DIAGNOSIS — I2510 Atherosclerotic heart disease of native coronary artery without angina pectoris: Secondary | ICD-10-CM | POA: Insufficient documentation

## 2018-02-25 MED ORDER — VITAMIN B-12 5000 MCG SL SUBL: 5000.0000 ug | SUBLINGUAL_TABLET | Freq: Every day | SUBLINGUAL | 1 refills | Status: DC

## 2018-02-25 NOTE — Progress Notes (Signed)
Safety precautions to be maintained throughout the outpatient stay will include: orient to surroundings, keep bed in low position, maintain call bell within reach at all times, provide assistance with transfer out of bed and ambulation.  

## 2018-02-25 NOTE — Telephone Encounter (Signed)
Patient missed her H&P 8/26 with [facility] and would like to get this rescheduled.  Aware Jacqueline Thornton not back in office until Friday.

## 2018-02-27 ENCOUNTER — Other Ambulatory Visit: Payer: Self-pay

## 2018-02-27 ENCOUNTER — Encounter
Admission: RE | Admit: 2018-02-27 | Discharge: 2018-02-27 | Disposition: A | Discharge status: Home / Self Care | Payer: Medicare Other | Source: Ambulatory Visit | Attending: Obstetrics and Gynecology | Admitting: Obstetrics and Gynecology

## 2018-02-27 DIAGNOSIS — I10 Essential (primary) hypertension: Secondary | ICD-10-CM | POA: Insufficient documentation

## 2018-02-27 DIAGNOSIS — I2510 Atherosclerotic heart disease of native coronary artery without angina pectoris: Secondary | ICD-10-CM | POA: Diagnosis not present

## 2018-02-27 DIAGNOSIS — Z0183 Encounter for blood typing: Secondary | ICD-10-CM | POA: Diagnosis not present

## 2018-02-27 DIAGNOSIS — Z01812 Encounter for preprocedural laboratory examination: Secondary | ICD-10-CM | POA: Diagnosis not present

## 2018-02-27 HISTORY — DX: Cardiac murmur, unspecified: R01.1

## 2018-02-27 LAB — TYPE AND SCREEN
ABO/RH(D): A POS
Antibody Screen: NEGATIVE

## 2018-02-27 LAB — CBC
HCT: 37.7 % (ref 35.0–47.0)
Hemoglobin: 12.9 g/dL (ref 12.0–16.0)
MCH: 33.1 pg (ref 26.0–34.0)
MCHC: 34.1 g/dL (ref 32.0–36.0)
MCV: 96.9 fL (ref 80.0–100.0)
Platelets: 183 10*3/uL (ref 150–440)
RBC: 3.89 MIL/uL (ref 3.80–5.20)
RDW: 14.3 % (ref 11.5–14.5)
WBC: 5.6 10*3/uL (ref 3.6–11.0)

## 2018-02-27 LAB — COMPREHENSIVE METABOLIC PANEL
ALT: 18 U/L (ref 0–44)
AST: 17 U/L (ref 15–41)
Albumin: 3.9 g/dL (ref 3.5–5.0)
Alkaline Phosphatase: 38 U/L (ref 38–126)
Anion gap: 7 (ref 5–15)
BUN: 15 mg/dL (ref 8–23)
CO2: 32 mmol/L (ref 22–32)
Calcium: 9.5 mg/dL (ref 8.9–10.3)
Chloride: 103 mmol/L (ref 98–111)
Creatinine, Ser: 0.76 mg/dL (ref 0.44–1.00)
GFR calc Af Amer: >60 mL/min (ref >60)
GFR calc non Af Amer: >60 mL/min (ref >60)
Glucose, Bld: 74 mg/dL (ref 70–99)
Potassium: 3.7 mmol/L (ref 3.5–5.1)
Sodium: 142 mmol/L (ref 135–145)
Total Bilirubin: 0.7 mg/dL (ref 0.3–1.2)
Total Protein: 6.3 g/dL — ABNORMAL LOW (ref 6.5–8.1)

## 2018-02-27 NOTE — Patient Instructions (Addendum)
  Your procedure is scheduled on: Tuesday March 03, 2018 @ 12:00 noon Report to Same Day Surgery 2nd floor medical mall ([facility]tr[facility]-take elevator on left to 2nd floor.  Check in with surgery information desk.)  Remember: Instructions that are not followed completely may result in serious medical risk, up to and including death, or upon the discretion of your surgeon and anesthesiologist your surgery may need to be rescheduled.    _x___ 1. Do not eat food (including mints, candies, chewing gum) after midnight the night before your procedure. You may drink clear liquids up to 2 hours before you are scheduled to arrive at the hospital for your procedure.  Do not drink clear liquids within 2 hours of your scheduled arrival to the hospital.  Clear liquids include  --Water or Apple juice without pulp  --Clear carbohydrate beverage such as Gatorade  --Black Coffee or Clear Tea (No milk, no creamers, do not add anything to the coffee or tea)    __x__ 2. No Alcohol for 24 hours before or after surgery.   __x__ 3. No Smoking or e-cigarettes for 24 prior to surgery.  Do not use any chewable tobacco products for at least 6 hour prior to surgery   __x__ 4. Notify your doctor if there is any change in your medical condition (cold, fever, infections).   __x__ 5. On the morning of surgery brush your teeth with toothpaste and water.  You may rinse your mouth with mouth wash if you wish.  Do not swallow any toothpaste or mouthwash.  Please read over the following fact sheets that you were given:   [facility] Preparing for Surgery and or MRSA Information    __x__ Use CHG Soap or sage wipes as directed on instruction sheet    Do not wear jewelry, make-up, hairpins, clips or nail polish.  Do not wear lotions, powders, deodorant, or perfumes.   Do not shave below the face/neck 48 hours prior to surgery.   Do not bring valuables to the hospital.    [facility] is not responsible for any  belongings or valuables.               Contacts, dentures or bridgework may not be worn into surgery.  For patients discharged on the day of surgery, you will NOT be permitted to drive yourself home.   _x___ Take anti-hypertensive listed below, cardiac, seizure, asthma, anti-reflux and psychiatric medicines. These include:  1. Esomeprazole/Nexium  2. Gabapentin/Neurontin  3. Percocet as needed for pain   _x___ Follow recommendations from Cardiologist, Pulmonologist or PCP regarding stopping Aspirin, Coumadin, Plavix ,Eliquis, Effient, or Pradaxa, and Pletal.  _x___ Stop Anti-inflammatories such as Advil, Aleve, Ibuprofen, Motrin, Naproxen, Naprosyn, Goodies powders or aspirin products. OK to take Tylenol and Celebrex.   _x___ NOW: Stop supplements until after surgery.  But may continue Vitamin D, Vitamin B, and multivitamin.

## 2018-02-27 NOTE — Telephone Encounter (Signed)
Patient's Pre-admit Testing appointment has been rescheduled for today @ 12:30pm. Dr. Glennon Mac will get H&P and consents on Tuesday, provided patient's medical clearance has been rec'd. I have left a message at Dr. Marella Bile office, and the patient said she is sure she was cleared, and intends to stop at Dr. Marella Bile office today.

## 2018-03-03 ENCOUNTER — Other Ambulatory Visit: Payer: Self-pay

## 2018-03-03 ENCOUNTER — Encounter
Admission: RE | Disposition: A | Discharge status: Home / Self Care | Payer: Self-pay | Source: Ambulatory Visit | Attending: Obstetrics and Gynecology

## 2018-03-03 ENCOUNTER — Encounter: Payer: Self-pay | Admitting: Emergency Medicine

## 2018-03-03 ENCOUNTER — Ambulatory Visit: Payer: Medicare Other

## 2018-03-03 ENCOUNTER — Ambulatory Visit
Admission: RE | Admit: 2018-03-03 | Discharge: 2018-03-03 | Disposition: A | Discharge status: Home / Self Care | Payer: Medicare Other | Source: Ambulatory Visit | Attending: Obstetrics and Gynecology | Admitting: Obstetrics and Gynecology

## 2018-03-03 DIAGNOSIS — E785 Hyperlipidemia, unspecified: Secondary | ICD-10-CM | POA: Insufficient documentation

## 2018-03-03 DIAGNOSIS — Z981 Arthrodesis status: Secondary | ICD-10-CM | POA: Insufficient documentation

## 2018-03-03 DIAGNOSIS — N83311 Acquired atrophy of right ovary: Secondary | ICD-10-CM | POA: Insufficient documentation

## 2018-03-03 DIAGNOSIS — N83202 Unspecified ovarian cyst, left side: Secondary | ICD-10-CM | POA: Insufficient documentation

## 2018-03-03 DIAGNOSIS — I10 Essential (primary) hypertension: Secondary | ICD-10-CM | POA: Insufficient documentation

## 2018-03-03 DIAGNOSIS — Z79891 Long term (current) use of opiate analgesic: Secondary | ICD-10-CM | POA: Diagnosis not present

## 2018-03-03 DIAGNOSIS — F329 Major depressive disorder, single episode, unspecified: Secondary | ICD-10-CM | POA: Insufficient documentation

## 2018-03-03 DIAGNOSIS — F419 Anxiety disorder, unspecified: Secondary | ICD-10-CM | POA: Diagnosis not present

## 2018-03-03 DIAGNOSIS — M549 Dorsalgia, unspecified: Secondary | ICD-10-CM | POA: Diagnosis not present

## 2018-03-03 DIAGNOSIS — M1990 Unspecified osteoarthritis, unspecified site: Secondary | ICD-10-CM | POA: Insufficient documentation

## 2018-03-03 DIAGNOSIS — Z90710 Acquired absence of both cervix and uterus: Secondary | ICD-10-CM | POA: Insufficient documentation

## 2018-03-03 DIAGNOSIS — N83312 Acquired atrophy of left ovary: Secondary | ICD-10-CM | POA: Diagnosis not present

## 2018-03-03 DIAGNOSIS — I2510 Atherosclerotic heart disease of native coronary artery without angina pectoris: Secondary | ICD-10-CM | POA: Insufficient documentation

## 2018-03-03 DIAGNOSIS — Z87891 Personal history of nicotine dependence: Secondary | ICD-10-CM | POA: Insufficient documentation

## 2018-03-03 DIAGNOSIS — G8929 Other chronic pain: Secondary | ICD-10-CM | POA: Insufficient documentation

## 2018-03-03 DIAGNOSIS — M797 Fibromyalgia: Secondary | ICD-10-CM | POA: Diagnosis not present

## 2018-03-03 DIAGNOSIS — Z79899 Other long term (current) drug therapy: Secondary | ICD-10-CM | POA: Diagnosis not present

## 2018-03-03 DIAGNOSIS — K219 Gastro-esophageal reflux disease without esophagitis: Secondary | ICD-10-CM | POA: Insufficient documentation

## 2018-03-03 DIAGNOSIS — R011 Cardiac murmur, unspecified: Secondary | ICD-10-CM | POA: Insufficient documentation

## 2018-03-03 DIAGNOSIS — N83292 Other ovarian cyst, left side: Secondary | ICD-10-CM | POA: Diagnosis not present

## 2018-03-03 HISTORY — PX: LAPAROSCOPIC SALPINGO OOPHERECTOMY: SHX5927

## 2018-03-03 HISTORY — PX: CYSTOSCOPY: SHX5120

## 2018-03-03 LAB — ABO/RH: ABO/RH(D): A POS

## 2018-03-03 SURGERY — SALPINGO-OOPHORECTOMY, LAPAROSCOPIC
Anesthesia: General | Laterality: Bilateral

## 2018-03-03 MED ORDER — DEXAMETHASONE SODIUM PHOSPHATE 10 MG/ML IJ SOLN
INTRAMUSCULAR | Status: DC | PRN
  Administered 2018-03-03: 10 mg via INTRAVENOUS

## 2018-03-03 MED ORDER — ROCURONIUM BROMIDE 100 MG/10ML IV SOLN
INTRAVENOUS | Status: DC | PRN
  Administered 2018-03-03: 50 mg via INTRAVENOUS

## 2018-03-03 MED ORDER — MIDAZOLAM HCL 2 MG/2ML IJ SOLN
INTRAMUSCULAR | Status: DC | PRN
  Administered 2018-03-03: 2 mg via INTRAVENOUS

## 2018-03-03 MED ORDER — ENOXAPARIN SODIUM 40 MG/0.4ML ~~LOC~~ SOLN
40.0000 mg | SUBCUTANEOUS | Status: AC
  Administered 2018-03-03: 40 mg via SUBCUTANEOUS
  Filled 2018-03-03: qty 0.4

## 2018-03-03 MED ORDER — MIDAZOLAM HCL 2 MG/2ML IJ SOLN
INTRAMUSCULAR | Status: AC
  Filled 2018-03-03: qty 2

## 2018-03-03 MED ORDER — FENTANYL CITRATE (PF) 100 MCG/2ML IJ SOLN
INTRAMUSCULAR | Status: AC
  Administered 2018-03-03: 25 ug via INTRAVENOUS
  Filled 2018-03-03: qty 2

## 2018-03-03 MED ORDER — ONDANSETRON HCL 4 MG/2ML IJ SOLN
INTRAMUSCULAR | Status: DC | PRN
  Administered 2018-03-03: 4 mg via INTRAVENOUS

## 2018-03-03 MED ORDER — OXYCODONE HCL 5 MG PO TABS: 5.0000 mg | ORAL_TABLET | Freq: Four times a day (QID) | ORAL | 0 refills | Status: DC | PRN

## 2018-03-03 MED ORDER — FENTANYL CITRATE (PF) 100 MCG/2ML IJ SOLN
INTRAMUSCULAR | Status: DC | PRN
  Administered 2018-03-03: 25 ug via INTRAVENOUS
  Administered 2018-03-03 (×2): 50 ug via INTRAVENOUS
  Administered 2018-03-03: 25 ug via INTRAVENOUS

## 2018-03-03 MED ORDER — DEXAMETHASONE SODIUM PHOSPHATE 10 MG/ML IJ SOLN
INTRAMUSCULAR | Status: AC
  Filled 2018-03-03: qty 1

## 2018-03-03 MED ORDER — LIDOCAINE HCL (PF) 2 % IJ SOLN
INTRAMUSCULAR | Status: AC
  Filled 2018-03-03: qty 10

## 2018-03-03 MED ORDER — BUPIVACAINE HCL (PF) 0.5 % IJ SOLN
INTRAMUSCULAR | Status: DC | PRN
  Administered 2018-03-03: 8 mL

## 2018-03-03 MED ORDER — ONDANSETRON HCL 4 MG/2ML IJ SOLN
INTRAMUSCULAR | Status: AC
  Filled 2018-03-03: qty 2

## 2018-03-03 MED ORDER — ACETAMINOPHEN 10 MG/ML IV SOLN
INTRAVENOUS | Status: DC | PRN
  Administered 2018-03-03: 1000 mg via INTRAVENOUS

## 2018-03-03 MED ORDER — ACETAMINOPHEN NICU IV SYRINGE 10 MG/ML
INTRAVENOUS | Status: AC
  Filled 2018-03-03: qty 1

## 2018-03-03 MED ORDER — PROPOFOL 10 MG/ML IV BOLUS
INTRAVENOUS | Status: DC | PRN
  Administered 2018-03-03: 90 mg via INTRAVENOUS

## 2018-03-03 MED ORDER — BUPIVACAINE HCL (PF) 0.5 % IJ SOLN
INTRAMUSCULAR | Status: AC
  Filled 2018-03-03: qty 30

## 2018-03-03 MED ORDER — SUGAMMADEX SODIUM 200 MG/2ML IV SOLN
INTRAVENOUS | Status: DC | PRN
  Administered 2018-03-03: 200 mg via INTRAVENOUS

## 2018-03-03 MED ORDER — OXYCODONE HCL 5 MG PO TABS
5.0000 mg | ORAL_TABLET | Freq: Once | ORAL | Status: AC
  Administered 2018-03-03: 5 mg via ORAL

## 2018-03-03 MED ORDER — ONDANSETRON HCL 4 MG/2ML IJ SOLN: 4.0000 mg | Freq: Once | INTRAMUSCULAR | Status: DC | PRN

## 2018-03-03 MED ORDER — FENTANYL CITRATE (PF) 100 MCG/2ML IJ SOLN
INTRAMUSCULAR | Status: AC
  Filled 2018-03-03: qty 2

## 2018-03-03 MED ORDER — KETOROLAC TROMETHAMINE 30 MG/ML IJ SOLN
INTRAMUSCULAR | Status: AC
  Filled 2018-03-03: qty 1

## 2018-03-03 MED ORDER — KETOROLAC TROMETHAMINE 30 MG/ML IJ SOLN
INTRAMUSCULAR | Status: DC | PRN
  Administered 2018-03-03: 30 mg via INTRAVENOUS

## 2018-03-03 MED ORDER — PROPOFOL 10 MG/ML IV BOLUS
INTRAVENOUS | Status: AC
  Filled 2018-03-03: qty 20

## 2018-03-03 MED ORDER — ROCURONIUM BROMIDE 50 MG/5ML IV SOLN
INTRAVENOUS | Status: AC
  Filled 2018-03-03: qty 1

## 2018-03-03 MED ORDER — LACTATED RINGERS IV SOLN
INTRAVENOUS | Status: DC
  Administered 2018-03-03: 12:00:00 via INTRAVENOUS

## 2018-03-03 MED ORDER — SUGAMMADEX SODIUM 200 MG/2ML IV SOLN
INTRAVENOUS | Status: AC
  Filled 2018-03-03: qty 2

## 2018-03-03 MED ORDER — PHENYLEPHRINE HCL 10 MG/ML IJ SOLN
INTRAMUSCULAR | Status: DC | PRN
  Administered 2018-03-03: 100 ug via INTRAVENOUS

## 2018-03-03 MED ORDER — LIDOCAINE HCL (CARDIAC) PF 100 MG/5ML IV SOSY
PREFILLED_SYRINGE | INTRAVENOUS | Status: DC | PRN
  Administered 2018-03-03: 50 mg via INTRAVENOUS

## 2018-03-03 MED ORDER — OXYCODONE HCL 5 MG PO TABS
ORAL_TABLET | ORAL | Status: AC
  Filled 2018-03-03: qty 1

## 2018-03-03 MED ORDER — IBUPROFEN 600 MG PO TABS: 600.0000 mg | ORAL_TABLET | Freq: Four times a day (QID) | ORAL | 0 refills | Status: DC | PRN

## 2018-03-03 MED ORDER — LACTATED RINGERS IV SOLN: INTRAVENOUS | Status: DC

## 2018-03-03 MED ORDER — FENTANYL CITRATE (PF) 100 MCG/2ML IJ SOLN
25.0000 ug | INTRAMUSCULAR | Status: AC | PRN
  Administered 2018-03-03 (×6): 25 ug via INTRAVENOUS

## 2018-03-03 MED ORDER — EPHEDRINE SULFATE 50 MG/ML IJ SOLN
INTRAMUSCULAR | Status: DC | PRN
  Administered 2018-03-03: 5 mg via INTRAVENOUS

## 2018-03-03 SURGICAL SUPPLY — 48 items
ADH SKN CLS APL DERMABOND .7 (GAUZE/BANDAGES/DRESSINGS) ×2
ANCHOR TIS RET SYS 235ML (MISCELLANEOUS) ×1 IMPLANT
BAG TISS RTRVL C235 10X14 (MISCELLANEOUS) ×2
BAG URINE DRAINAGE (UROLOGICAL SUPPLIES) ×3 IMPLANT
BLADE SURG SZ11 CARB STEEL (BLADE) ×3 IMPLANT
CANISTER SUCT 1200ML W/VALVE (MISCELLANEOUS) ×3 IMPLANT
CATH FOL 2WAY LX 16X5 (CATHETERS) ×3 IMPLANT
CHLORAPREP W/TINT 26ML (MISCELLANEOUS) ×3 IMPLANT
DERMABOND ADVANCED (GAUZE/BANDAGES/DRESSINGS) ×1
DERMABOND ADVANCED .7 DNX12 (GAUZE/BANDAGES/DRESSINGS) ×2 IMPLANT
DRAPE CAMERA CLOSED 9X96 (DRAPES) ×1 IMPLANT
DRAPE LEGGINS SURG 28X43 STRL (DRAPES) IMPLANT
DRAPE SHEET LG 3/4 BI-LAMINATE (DRAPES) ×3 IMPLANT
DRAPE UNDER BUTTOCK W/FLU (DRAPES) ×3 IMPLANT
GLOVE BIO SURGEON STRL SZ7 (GLOVE) ×6 IMPLANT
GLOVE BIOGEL PI IND STRL 7.5 (GLOVE) ×2 IMPLANT
GLOVE BIOGEL PI INDICATOR 7.5 (GLOVE) ×4
GOWN STRL REUS W/ TWL LRG LVL3 (GOWN DISPOSABLE) ×6 IMPLANT
GOWN STRL REUS W/TWL LRG LVL3 (GOWN DISPOSABLE) ×9
GRASPER SUT TROCAR 14GX15 (MISCELLANEOUS) ×3 IMPLANT
IRRIGATION STRYKERFLOW (MISCELLANEOUS) IMPLANT
IRRIGATOR STRYKERFLOW (MISCELLANEOUS)
IV LACTATED RINGERS 1000ML (IV SOLUTION) ×3 IMPLANT
KIT PINK PAD W/HEAD ARE REST (MISCELLANEOUS) ×3
KIT PINK PAD W/HEAD ARM REST (MISCELLANEOUS) ×2 IMPLANT
KIT TURNOVER CYSTO (KITS) ×3 IMPLANT
LABEL OR SOLS (LABEL) ×3 IMPLANT
LIGASURE VESSEL 5MM BLUNT TIP (ELECTROSURGICAL) ×3 IMPLANT
MANIPULATOR UTERINE 4.5 ZUMI (MISCELLANEOUS) IMPLANT
NEEDLE HYPO 22GX1.5 SAFETY (NEEDLE) ×3 IMPLANT
NS IRRIG 500ML POUR BTL (IV SOLUTION) ×3 IMPLANT
PACK LAP CHOLECYSTECTOMY (MISCELLANEOUS) ×3 IMPLANT
PAD OB MATERNITY 4.3X12.25 (PERSONAL CARE ITEMS) ×3 IMPLANT
PAD PREP 24X41 OB/GYN DISP (PERSONAL CARE ITEMS) ×3 IMPLANT
SCISSORS METZENBAUM CVD 33 (INSTRUMENTS) ×1 IMPLANT
SLEEVE ENDOPATH XCEL 5M (ENDOMECHANICALS) ×3 IMPLANT
SOL PREP PVP 2OZ (MISCELLANEOUS) ×3
SOLUTION PREP PVP 2OZ (MISCELLANEOUS) ×2 IMPLANT
SURGILUBE 2OZ TUBE FLIPTOP (MISCELLANEOUS) ×3 IMPLANT
SUT MNCRL 4-0 (SUTURE)
SUT MNCRL 4-0 27XMFL (SUTURE)
SUT VIC AB 0 CT1 36 (SUTURE) ×3 IMPLANT
SUT VIC AB 2-0 UR6 27 (SUTURE) ×3 IMPLANT
SUTURE MNCRL 4-0 27XMF (SUTURE) ×2 IMPLANT
SYR 50ML LL SCALE MARK (SYRINGE) ×3 IMPLANT
TROCAR ENDO BLADELESS 11MM (ENDOMECHANICALS) ×3 IMPLANT
TROCAR XCEL NON-BLD 5MMX100MML (ENDOMECHANICALS) ×3 IMPLANT
TUBING INSUFFLATION (TUBING) ×3 IMPLANT

## 2018-03-03 NOTE — Anesthesia Post-op Follow-up Note (Signed)
Anesthesia QCDR form completed.        

## 2018-03-03 NOTE — Interval H&P Note (Signed)
History and Physical Interval Note:  03/03/2018 12:46 PM  Jacqueline Thornton  has presented today for surgery, with the diagnosis of complex left ovarian cyst  The various methods of treatment have been discussed with the patient and family. After consideration of risks, benefits and other options for treatment, the patient has consented to  Procedure(s): LAPAROSCOPIC SALPINGO OOPHORECTOMY (Bilateral) as a surgical intervention .  The patient's history has been reviewed, patient examined, no change in status, stable for surgery.  I have reviewed the patient's chart and labs.  Questions were answered to the patient's satisfaction.  The patient has been given cardiac clearance from her PCP.  She has no complaints today. She denies chest pain, trouble breathing, new edema. The consent form was reviewed in detail and signed. She understands that I may terminate the procedure any time, if she has adhesive disease too significant to complete the surgery.   Patient examined by me (heart, lungs, abdomen). No changes to prior exam.   Prentice Docker, MD, [place], [facility] 03/03/2018 12:47 PM

## 2018-03-03 NOTE — Op Note (Signed)
Operative Note    Pre-Op Diagnosis: persistent complex left ovarian cyst  Post-Op Diagnosis: persistent complex left ovarian cyst  Procedures:  1. Laparoscopic bilateral salpingo-oophorectomy 2. cystoscopy  Primary Surgeon: Prentice Docker, MD   Assistant Surgeon: Barnett Applebaum, MD: No other capable assistant available, in surgery requiring high level assistant.  EBL: 10 mL   IVF: 700 mL crystalloid  Urine output: 100 mL clear urine  Specimens:  1) left fallopian tube and ovary 2) right fallopian tube and ovary  Drains: none  Complications: None   Disposition: PACU   Condition: Stable   Findings:  1) moderate adhesive disease of omentum to pelvic sidewalls and bladder 2) no evidence of damage to bladder wall and definite efflux of urine from bilateral ureteral orifices on cystoscopy 3) 3 cm left ovarian cyst.  Left ovary adherent to left pelvic sidewall and omentum 4) normal appearing right ovary.  Right ovary adherent to right pelvic sidewall and omentum  Procedure Summary:  The patient was taken to the operating room where general anesthesia was administered and found to be adequate. She was placed in the dorsal supine lithotomy position in [place] stirrups and prepped and draped in usual sterile fashion. After a timeout was called an indwelling catheter was placed in her bladder.   Attention was turned to the abdomen where after injection of local anesthetic, a 5 mm infraumbilical incision was made with the scalpel. Entry into the abdomen was obtained via Optiview trocar technique (a blunt entry technique with camera visualization through the obturator upon entry). Verification of entry into the abdomen was obtained using opening pressures. The abdomen was insufflated with CO2. The camera was introduced through the trocar with verification of atraumatic entry.  A right lower quadrant 11 mm port was placed under direct intra-abdominal camera visualization without  difficulty.  Similarly, a left lower quadrant 5 mm port was placed under direct intra-abdominal camera visualization without difficulty.  Attention was turned to the pelvis with the above-noted findings appreciated.  The omentum was carefully dissected from the left pelvic sidewall until the left ovary and cyst were noted.  The retroperitoneal space was entered just lateral to the infundibulopelvic ligament.  The space was developed inferiorly to the level of the ovary.  Using this technique the ureter, which could not be visualized, was avoided and the ovary was dissected out with the peritoneum.  The infundibulopelvic ligament was skeletonized.  The omentum was removed from the ovary.  Using the LigaSure device the infundibulopelvic ligament was clamped, cauterized, and cut with hemostasis noted after complete transection.  The left ovary and fallopian tube were removed in an Endo Catch bag through the right lower quadrant port.  The same procedure was carried out on the right side.  Adhesions were removed of the omentum for better visualization.  The retroperitoneal space was entered lateral to the infundibulopelvic ligament.  The space was developed parallel to the route of the ovary inferiorly and the ovary was removed with the peritoneum after removing any omental adhesions.  The infundibulopelvic ligament was skeletonized and transected using the LigaSure in a similar fashion to the left side.  The right fallopian tube and ovary were removed through the right lower quadrant port intact.  The pedicles were inspected and found to be hemostatic.  Attention was turned to cystoscopy.  This was performed due to inability to visualize the ureters and the proximity of the ovaries to the ureters.  The 70 degree cystoscope was introduced through the urethra after  removal of the Foley.  The bladder was distended using sterile isotonic fluid.  The bladder was found to be intact and the efflux was noted through the  bilateral ureteral orifices.  The camera was removed and the Foley was replaced.  The camera was reintroduced into the abdomen and the pedicle lines remain hemostatic.  The right lower quadrant trocar was removed and the fascia was reapproximated using a single 0 Vicryl interrupted stitch using the fascial closure device.  The abdomen was emptied of CO2 with the help of 5 deep breaths from anesthesia.  The remaining trochars were removed without difficulty.  The right lower quadrant port skin was closed in a subcuticular fashion with 4-0 Monocryl.  The umbilical incision was similarly closed with 4-0 Monocryl.  Both incision sites were covered with surgical skin glue.  The left lower quadrant port site skin was closed using surgical skin glue.  The catheter was removed without difficulty.  The patient tolerated the procedure well.  Sponge, lap, needle, and instrument counts were correct x 2.  VTE prophylaxis: SCDs and preoperative lovenox 40 mg subcutaneously. Antibiotic prophylaxis: none indicated and none given. She was awakened in the operating room and was taken to the PACU in stable condition.   Prentice Docker, MD 03/03/2018 2:59 PM

## 2018-03-03 NOTE — Anesthesia Procedure Notes (Signed)
Procedure Name: Intubation Date/Time: 03/03/2018 1:30 PM Performed by: Eben Burow, CRNA Pre-anesthesia Checklist: Patient identified, Emergency Drugs available, Suction available, Patient being monitored and Timeout performed Patient Re-evaluated:Patient Re-evaluated prior to induction Oxygen Delivery Method: Circle system utilized Preoxygenation: Pre-oxygenation with 100% oxygen Induction Type: IV induction Ventilation: Mask ventilation without difficulty Laryngoscope Size: Miller and 2 Grade View: Grade I Tube type: Oral Tube size: 7.0 mm Number of attempts: 1 Airway Equipment and Method: Stylet and LTA kit utilized Placement Confirmation: ETT inserted through vocal cords under direct vision,  positive ETCO2 and breath sounds checked- equal and bilateral Secured at: 20 cm Tube secured with: Tape Dental Injury: Teeth and Oropharynx as per pre-operative assessment

## 2018-03-03 NOTE — Discharge Instructions (Signed)

## 2018-03-03 NOTE — Anesthesia Preprocedure Evaluation (Signed)
Anesthesia Evaluation  Patient identified by MRN, date of birth, ID band Patient awake    Reviewed: Allergy & Precautions, H&P , NPO status , Patient's Chart, lab work & pertinent test results, reviewed documented beta blocker date and time   Airway Mallampati: II   Neck ROM: full    Dental  (+) Upper Dentures Hx of pootr dentition:   Pulmonary neg pulmonary ROS, asthma , former smoker,    Pulmonary exam normal        Cardiovascular Exercise Tolerance: Poor hypertension, + angina + CAD  negative cardio ROS Normal cardiovascular exam+ Valvular Problems/Murmurs      Neuro/Psych  Headaches, PSYCHIATRIC DISORDERS Anxiety Depression  Neuromuscular disease negative neurological ROS  negative psych ROS   GI/Hepatic negative GI ROS, Neg liver ROS, GERD  ,  Endo/Other  negative endocrine ROS  Renal/GU negative Renal ROS     Musculoskeletal  (+) Arthritis , Fibromyalgia -  Abdominal Normal abdominal exam  (+)   Peds  Hematology negative hematology ROS (+) anemia ,   Anesthesia Other Findings Past Medical History:   Anxiety                                                      Arthritis                                                    Depression                                                   GERD (gastroesophageal reflux disease)                       Hyperlipidemia                                               Asthma                                                       Migraine                                                     Chronic back pain                                            CAD (coronary artery disease)  Angina pectoris ([place])                                        Spine disorder                                               Fibromyalgia                                                 Hypertension                                                 Arthropathy of sacroiliac  joint                 04/11/2015   Low back pain                                   04/06/2015    Lumbar radicular pain                           04/18/2015   Narrowing of intervertebral disc space          04/11/2015   Sacroiliac joint pain                           04/11/2015   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)         04/11/2015 Past Surgical History:   CHOLECYSTECTOMY                                               ABDOMINAL HYSTERECTOMY                                        NECK SURGERY                                    Bilateral              APPENDECTOMY                                                  NECK SURGERY                                                  SHOULDER SURGERY  BMI    Body Mass Index   27.64 kg/m 2     Reproductive/Obstetrics                             Anesthesia Physical  Anesthesia Plan  ASA: III  Anesthesia Plan: General   Post-op Pain Management:    Induction: Intravenous  PONV Risk Score and Plan:   Airway Management Planned: Oral ETT  Additional Equipment:   Intra-op Plan:   Post-operative Plan: Extubation in OR  Informed Consent: I have reviewed the patients History and Physical, chart, labs and discussed the procedure including the risks, benefits and alternatives for the proposed anesthesia with the patient or authorized representative who has indicated his/her understanding and acceptance.   Dental Advisory Given  Plan Discussed with: CRNA  Anesthesia Plan Comments:         Anesthesia Quick Evaluation

## 2018-03-03 NOTE — Transfer of Care (Signed)
Immediate Anesthesia Transfer of Care Note  Patient: Jacqueline Thornton  Procedure(s) Performed: LAPAROSCOPIC SALPINGO OOPHORECTOMY (Bilateral ) CYSTOSCOPY  Patient Location: PACU  Anesthesia Type:General  Level of Consciousness: awake, alert , oriented and patient cooperative  Airway & Oxygen Therapy: Patient Spontanous Breathing and Patient connected to face mask oxygen  Post-op Assessment: Report given to RN and Post -op Vital signs reviewed and stable  Post vital signs: Reviewed and stable  Last Vitals:  Vitals Value Taken Time  BP 127/59 03/03/2018  3:10 PM  Temp 36.7 C 03/03/2018  3:10 PM  Pulse 107 03/03/2018  3:10 PM  Resp 13 03/03/2018  3:10 PM  SpO2 100 % 03/03/2018  3:10 PM  Vitals shown include unvalidated device data.  Last Pain:  Vitals:   03/03/18 1211  TempSrc: Oral  PainSc: 1          Complications: No apparent anesthesia complications

## 2018-03-04 ENCOUNTER — Encounter: Payer: Self-pay | Admitting: Obstetrics and Gynecology

## 2018-03-05 LAB — SURGICAL PATHOLOGY

## 2018-03-06 ENCOUNTER — Telehealth: Payer: Self-pay

## 2018-03-06 NOTE — Telephone Encounter (Signed)
Pt had surgery 9/3 c SDJ. Has no pain on left side but the right side is killing her - hurts to turn over.  Has 6 pain pills left and would like a refill since weekend is coming.  [phone]

## 2018-03-08 ENCOUNTER — Emergency Department
Admission: EM | Admit: 2018-03-08 | Discharge: 2018-03-09 | Disposition: A | Discharge status: Home / Self Care | Payer: Medicare Other | Attending: Emergency Medicine | Admitting: Emergency Medicine

## 2018-03-08 ENCOUNTER — Emergency Department: Payer: Medicare Other

## 2018-03-08 ENCOUNTER — Encounter: Payer: Self-pay | Admitting: Radiology

## 2018-03-08 ENCOUNTER — Other Ambulatory Visit: Payer: Self-pay

## 2018-03-08 DIAGNOSIS — F419 Anxiety disorder, unspecified: Secondary | ICD-10-CM | POA: Diagnosis not present

## 2018-03-08 DIAGNOSIS — Z87891 Personal history of nicotine dependence: Secondary | ICD-10-CM | POA: Diagnosis not present

## 2018-03-08 DIAGNOSIS — I2510 Atherosclerotic heart disease of native coronary artery without angina pectoris: Secondary | ICD-10-CM | POA: Diagnosis not present

## 2018-03-08 DIAGNOSIS — I10 Essential (primary) hypertension: Secondary | ICD-10-CM | POA: Insufficient documentation

## 2018-03-08 DIAGNOSIS — G8918 Other acute postprocedural pain: Secondary | ICD-10-CM | POA: Insufficient documentation

## 2018-03-08 DIAGNOSIS — Z9049 Acquired absence of other specified parts of digestive tract: Secondary | ICD-10-CM | POA: Insufficient documentation

## 2018-03-08 DIAGNOSIS — F329 Major depressive disorder, single episode, unspecified: Secondary | ICD-10-CM | POA: Insufficient documentation

## 2018-03-08 DIAGNOSIS — R102 Pelvic and perineal pain: Secondary | ICD-10-CM | POA: Insufficient documentation

## 2018-03-08 DIAGNOSIS — R1031 Right lower quadrant pain: Secondary | ICD-10-CM | POA: Diagnosis present

## 2018-03-08 DIAGNOSIS — F1120 Opioid dependence, uncomplicated: Secondary | ICD-10-CM | POA: Insufficient documentation

## 2018-03-08 LAB — URINALYSIS, ROUTINE W REFLEX MICROSCOPIC
Bilirubin Urine: NEGATIVE
Glucose, UA: NEGATIVE mg/dL
Hgb urine dipstick: NEGATIVE
Ketones, ur: NEGATIVE mg/dL
Leukocytes, UA: NEGATIVE
Nitrite: NEGATIVE
Protein, ur: NEGATIVE mg/dL
Specific Gravity, Urine: 1.021 (ref 1.005–1.030)
pH: 5 (ref 5.0–8.0)

## 2018-03-08 LAB — CBC
HCT: 37.2 % (ref 35.0–47.0)
Hemoglobin: 12.7 g/dL (ref 12.0–16.0)
MCH: 33 pg (ref 26.0–34.0)
MCHC: 34.1 g/dL (ref 32.0–36.0)
MCV: 96.9 fL (ref 80.0–100.0)
Platelets: 210 10*3/uL (ref 150–440)
RBC: 3.84 MIL/uL (ref 3.80–5.20)
RDW: 14.9 % — ABNORMAL HIGH (ref 11.5–14.5)
WBC: 5.5 10*3/uL (ref 3.6–11.0)

## 2018-03-08 LAB — COMPREHENSIVE METABOLIC PANEL
ALT: 14 U/L (ref 0–44)
AST: 14 U/L — ABNORMAL LOW (ref 15–41)
Albumin: 3.3 g/dL — ABNORMAL LOW (ref 3.5–5.0)
Alkaline Phosphatase: 41 U/L (ref 38–126)
Anion gap: 5 (ref 5–15)
BUN: 15 mg/dL (ref 8–23)
CO2: 29 mmol/L (ref 22–32)
Calcium: 8.4 mg/dL — ABNORMAL LOW (ref 8.9–10.3)
Chloride: 104 mmol/L (ref 98–111)
Creatinine, Ser: 0.97 mg/dL (ref 0.44–1.00)
GFR calc Af Amer: >60 mL/min (ref >60)
GFR calc non Af Amer: 60 mL/min — ABNORMAL LOW (ref >60)
Glucose, Bld: 171 mg/dL — ABNORMAL HIGH (ref 70–99)
Potassium: 3.9 mmol/L (ref 3.5–5.1)
Sodium: 138 mmol/L (ref 135–145)
Total Bilirubin: 0.5 mg/dL (ref 0.3–1.2)
Total Protein: 5.5 g/dL — ABNORMAL LOW (ref 6.5–8.1)

## 2018-03-08 MED ORDER — IOPAMIDOL (ISOVUE-300) INJECTION 61%
100.0000 mL | Freq: Once | INTRAVENOUS | Status: AC | PRN
  Administered 2018-03-08: 100 mL via INTRAVENOUS

## 2018-03-08 MED ORDER — MORPHINE SULFATE (PF) 4 MG/ML IV SOLN
4.0000 mg | Freq: Once | INTRAVENOUS | Status: AC
  Administered 2018-03-08: 4 mg via INTRAVENOUS
  Filled 2018-03-08: qty 1

## 2018-03-08 NOTE — ED Triage Notes (Signed)
Patient had bilateral oopherectomy by Dr. Glennon Mac. Two days after the surgery started with pain. Incision is healing well without drainage noted. Is concerned she has something else going on. Has not had a fever at home and denies N/V/D

## 2018-03-08 NOTE — ED Notes (Signed)
Pt up to toilet in room to collect urine specimen; pt needed assistance moving from laying to standing due to pain in right lower quadrant; then was able to ambulate unassisted to toilet to void;

## 2018-03-08 NOTE — ED Provider Notes (Signed)
[facility] Emergency Department Provider Note       Time seen: ----------------------------------------- 10:40 PM on 03/08/2018 -----------------------------------------   I have reviewed the triage vital signs and the nursing notes.  HISTORY   Chief Complaint Post-op Problem    HPI Jacqueline Thornton is a 67 y.o. female with a history of anxiety, coronary artery disease, depression, fibromyalgia, hyperlipidemia who presents to the ED for abdominal pain.  Patient states she had a bilateral laparoscopic oophorectomy 5 days ago due to large cysts.  Patient states pain does not seem to be improving, she is having some right lower quadrant abdominal pain and some tenderness around the right side incision.  She denies fevers or chills, denies urinary symptoms, chest pain or cough.  Past Medical History:  Diagnosis Date  . Absolute anemia 04/11/2015  . Acute postoperative pain 08/07/2017  . Angina pectoris ([place])   . Anxiety   . Arthritis   . 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 Atypical face pain 04/24/2015  . CAD (coronary artery disease)   . Chronic back pain   . Depression   . Fibromyalgia   . GERD (gastroesophageal reflux disease)   . 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) 04/11/2015  . H/O: hysterectomy 1979  . Heart murmur   . Hyperlipidemia   . Hypertension   . Low back pain 04/06/2015  . Lumbar radicular pain 04/18/2015  . Migraine   . Narrowing of intervertebral disc space 04/11/2015  . Sacroiliac joint pain 04/11/2015  . Spine disorder     Patient Active Problem List   Diagnosis Date Noted  . Vitamin B12 deficiency anemia 02/25/2018  . Vitamin B12 deficiency 02/25/2018  . Trigger point with back pain (Right) 02/25/2018  . Fibromyalgia 02/25/2018  . 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 Left ovarian cyst 02/17/2018  . Bone island of right femur 12/11/2017  . Age-related osteoporosis without current  pathological fracture 10/08/2017  . Spondylosis without myelopathy or radiculopathy, lumbosacral region 09/16/2017  . Spondylosis without myelopathy or radiculopathy, sacral and sacrococcygeal region 09/16/2017  . Other specified dorsopathies, sacral and sacrococcygeal region 09/16/2017  . Pseudophakia of left eye 02/24/2017  . Age-related nuclear cataract of left eye 12/24/2016  . Posterior subcapsular polar age-related cataract of left eye 12/24/2016  . Pseudophakia of right eye 12/23/2016  . Trochanteric bursitis of hip (Bilateral) (L>R) 12/05/2016  . Degenerative disk disease 12/04/2016  . Osteoarthritis of hip (Left) 01/23/2016  . Neurogenic pain 09/27/2015  . Myofascial pain 09/27/2015  . Trochanteric bursitis of right hip 08/23/2015  . Chronic hip pain (Bilateral) (L>R) 08/23/2015  . Chronic sacroiliac joint pain (Bilateral) (L>R) 07/06/2015  . Chronic lumbar radicular pain (Primary Source of Pain) (Left) (S1 Dermatome) 07/06/2015  . Long term prescription opiate use 07/06/2015  . Encounter for chronic pain management 07/06/2015  . Long term current use of opiate analgesic 04/18/2015  . Encounter for therapeutic drug level monitoring 04/18/2015  . Uncomplicated opioid dependence ([place]) 04/18/2015  . Opiate use (56.25 MME/Day) 04/18/2015  . Myalgia 04/18/2015  . Anemia 04/11/2015  . Gout 04/11/2015  . Hyperlipidemia 04/11/2015  . Headache, migraine 04/11/2015  . Panic attack 04/11/2015  . Chronic low back pain (Secondary source of pain) (Bilateral) (midline to tailbone) (R>L) 04/11/2015  . Spondylosis of lumbar spine 04/11/2015  . Lumbar annular disc tear (L4-5) 04/11/2015  . Discogenic low back pain (L3-4 and L4-5) 04/11/2015  . Lumbar facet hypertrophy 04/11/2015  . Lumbar facet syndrome (Bilateral) (  R>L) 04/11/2015  . Chronic neck pain 04/11/2015  . Cervical spondylosis 04/11/2015  . Hx of cervical spine surgery 04/11/2015  . Cervical spinal fusion (C6-7 interbody  fusion) 04/11/2015    Class: History of  . Coronary atherosclerosis 04/11/2015  . CAD in native artery 04/11/2015  . Arthrodesis status 04/11/2015  . Chronic pain syndrome 04/06/2015  . Anxiety, generalized 01/15/2014    Past Surgical History:  Procedure Laterality Date  . ABDOMINAL HYSTERECTOMY    . APPENDECTOMY    . caract surger     02/24/17  . CHOLECYSTECTOMY    . COLONOSCOPY WITH PROPOFOL N/A 07/19/2015   Procedure: COLONOSCOPY WITH PROPOFOL;  Surgeon: Manya Silvas, MD;  Location: [facility] ENDOSCOPY;  Service: Endoscopy;  Laterality: N/A;  . CYSTOSCOPY  03/03/2018   Procedure: CYSTOSCOPY;  Surgeon: Will Bonnet, MD;  Location: [facility] ORS;  Service: Gynecology;;  . LAPAROSCOPIC SALPINGO OOPHERECTOMY Bilateral 03/03/2018   Procedure: LAPAROSCOPIC SALPINGO OOPHORECTOMY;  Surgeon: Will Bonnet, MD;  Location: [facility] ORS;  Service: Gynecology;  Laterality: Bilateral;  . NECK SURGERY    . SHOULDER SURGERY      Allergies Patient has no known allergies.  Social History Social History   Tobacco Use  . Smoking status: Former Research scientist (life sciences)  . Smokeless tobacco: Never Used  Substance Use Topics  . Alcohol use: No    Alcohol/week: 0.0 standard drinks  . Drug use: No   Review of Systems Constitutional: Negative for fever. Cardiovascular: Negative for chest pain. Respiratory: Negative for shortness of breath. Gastrointestinal: Positive for abdominal pain, negative for vomiting, and positive for constipation Genitourinary: Negative for dysuria. Musculoskeletal: Negative for back pain. Skin: Negative for rash. Neurological: Negative for headaches, focal weakness or numbness.  All systems negative/normal/unremarkable except as stated in the HPI  ____________________________________________   PHYSICAL EXAM:  VITAL SIGNS: ED Triage Vitals  Enc Vitals Group     BP 03/08/18 2114 99/78     Pulse Rate 03/08/18 2114 96     Resp 03/08/18 2114 18     Temp 03/08/18 2114 (!) 97.5 F  (36.4 C)     Temp Source 03/08/18 2114 Oral     SpO2 03/08/18 2114 99 %     Weight 03/08/18 2116 143 lb (64.9 kg)     Height 03/08/18 2116 5' 0.5" (1.537 m)     Head Circumference --      Peak Flow --      Pain Score 03/08/18 2122 7     Pain Loc --      Pain Edu? --      Excl. in [place]? --    Constitutional: Alert and oriented. Well appearing and in no distress. Eyes: Conjunctivae are normal. Normal extraocular movements. ENT   Head: Normocephalic and atraumatic.   Nose: No congestion/rhinnorhea.   Mouth/Throat: Mucous membranes are moist.   Neck: No stridor. Cardiovascular: Normal rate, regular rhythm. No murmurs, rubs, or gallops. Respiratory: Normal respiratory effort without tachypnea nor retractions. Breath sounds are clear and equal bilaterally. No wheezes/rales/rhonchi. Gastrointestinal: Right lower quadrant tenderness and right sided pelvic tenderness.  No rebound or guarding.  Normal bowel sounds. Musculoskeletal: Nontender with normal range of motion in extremities. No lower extremity tenderness nor edema. Neurologic:  Normal speech and language. No gross focal neurologic deficits are appreciated.  Skin: Incision sites in the periumbilical and right lower quadrant area have some ecchymosis but no redness or swelling.   Psychiatric: Mood and affect are normal. Speech and behavior are normal.  ____________________________________________  ED COURSE:  As part of my medical decision making, I reviewed the following data within the [place] History obtained from family if available, nursing notes, old chart and ekg, as well as notes from prior ED visits. Patient presented for abdominal pain postoperatively, we will assess with labs and imaging as indicated at this time.   Procedures ____________________________________________   LABS (pertinent positives/negatives)  Labs Reviewed  CBC - Abnormal; Notable for the following components:       Result Value   RDW 14.9 (*)    All other components within normal limits  COMPREHENSIVE METABOLIC PANEL - Abnormal; Notable for the following components:   Glucose, Bld 171 (*)    Calcium 8.4 (*)    Total Protein 5.5 (*)    Albumin 3.3 (*)    AST 14 (*)    GFR calc non Af Amer 60 (*)    All other components within normal limits  URINALYSIS, ROUTINE W REFLEX MICROSCOPIC - Abnormal; Notable for the following components:   Color, Urine YELLOW (*)    APPearance HAZY (*)    All other components within normal limits    RADIOLOGY Images were viewed by me  CT the abdomen pelvis with contrast Is pending at this time ____________________________________________  DIFFERENTIAL DIAGNOSIS   Constipation, UTI, pyelonephritis, renal colic, appendicitis, postop hematoma, abscess  FINAL ASSESSMENT AND PLAN  Abdominal pain   Plan: The patient had presented for postoperative abdominal pain. Patient's labs were reassuring. Patient's imaging is pending.  I suspect constipation.   Laurence Aly, MD   Note: This note was generated in part or whole with voice recognition software. Voice recognition is usually quite accurate but there are transcription errors that can and very often do occur. I apologize for any typographical errors that were not detected and corrected.     Earleen Newport, MD 03/08/18 [phone]

## 2018-03-08 NOTE — ED Notes (Signed)
Dr. Williams in to see pt.

## 2018-03-08 NOTE — ED Notes (Signed)
Patient transported to CT via wheelchair

## 2018-03-08 NOTE — ED Notes (Signed)
Pt back from CT; understands nothing to eat or drink until results are back

## 2018-03-08 NOTE — ED Notes (Signed)
Pt says she had a bilateral laparoscopic oophorectomy on 03/03/18 here at this facility due to large ovarian cyst; pt says since surgery she's had constant pain daily that does not seem to be improving; pt c/o right lower quadrant pain and tenderness around incision site; incisions all well approximated; pt denies drainage from any sites; denies fever; denies urinary s/s; states no bowel movement since prior to her surgery but taking stool softener daily; voiding normal; pt says she takes percocet daily for chronic back pain and was prescribed oxycodone after surgery for breakthrough pain; she is out of her breakthrough pain medication

## 2018-03-09 ENCOUNTER — Telehealth: Payer: Self-pay

## 2018-03-09 MED ORDER — OXYCODONE HCL 5 MG PO TABS: 5.0000 mg | ORAL_TABLET | Freq: Four times a day (QID) | ORAL | 0 refills | Status: DC | PRN

## 2018-03-09 NOTE — ED Notes (Signed)
Pt resting quietly; waiting for MD followup

## 2018-03-09 NOTE — Telephone Encounter (Signed)
Trying to call pt this AM, she called after hours nurse last night with post op pain . Was advised to go to ER. Calling to check and follow up with pt. Please let me know when she calls back and make sure she has f/u appt with SDJ as directed at ER

## 2018-03-09 NOTE — ED Notes (Signed)
Answered call bell; pt on iPad, resting in bed; HOB adjusted per request; rates pain 2/10

## 2018-03-09 NOTE — ED Notes (Signed)
Dr Owens Shark in to follow up

## 2018-03-09 NOTE — ED Provider Notes (Signed)
I assumed care of the patient from Dr. Jimmye Norman at 11:30 PM. CT scan of the abdomen and pelvis revealed no acute intra-abdominal or pelvic pathology. Patient informed me that she has ran out of her oxycodone 5 mg tablets. Patient prescribed 15 tablets of oxycodone. Patient scheduled to follow-up with Dr. Prentice Docker in 2 days   Gregor Hams, MD 03/09/18 [phone]

## 2018-03-10 ENCOUNTER — Ambulatory Visit: Payer: Medicare Other | Attending: Nurse Practitioner | Admitting: Nurse Practitioner

## 2018-03-10 ENCOUNTER — Other Ambulatory Visit: Payer: Self-pay

## 2018-03-10 ENCOUNTER — Encounter: Payer: Self-pay | Admitting: Nurse Practitioner

## 2018-03-10 ENCOUNTER — Encounter: Payer: Self-pay | Admitting: Obstetrics and Gynecology

## 2018-03-10 ENCOUNTER — Ambulatory Visit (INDEPENDENT_AMBULATORY_CARE_PROVIDER_SITE_OTHER): Payer: Medicare Other | Admitting: Obstetrics and Gynecology

## 2018-03-10 VITALS — BP 107/59 | HR 89 | Temp 98.1°F | Resp 97 | Ht 60.5 in | Wt 143.0 lb

## 2018-03-10 VITALS — BP 122/74 | Ht 60.0 in | Wt 143.0 lb

## 2018-03-10 DIAGNOSIS — M47816 Spondylosis without myelopathy or radiculopathy, lumbar region: Secondary | ICD-10-CM | POA: Diagnosis not present

## 2018-03-10 DIAGNOSIS — M797 Fibromyalgia: Secondary | ICD-10-CM | POA: Diagnosis not present

## 2018-03-10 DIAGNOSIS — M1612 Unilateral primary osteoarthritis, left hip: Secondary | ICD-10-CM | POA: Diagnosis not present

## 2018-03-10 DIAGNOSIS — Z5181 Encounter for therapeutic drug level monitoring: Secondary | ICD-10-CM | POA: Insufficient documentation

## 2018-03-10 DIAGNOSIS — G894 Chronic pain syndrome: Secondary | ICD-10-CM | POA: Insufficient documentation

## 2018-03-10 DIAGNOSIS — Z79891 Long term (current) use of opiate analgesic: Secondary | ICD-10-CM | POA: Insufficient documentation

## 2018-03-10 DIAGNOSIS — M47812 Spondylosis without myelopathy or radiculopathy, cervical region: Secondary | ICD-10-CM | POA: Diagnosis not present

## 2018-03-10 DIAGNOSIS — Z09 Encounter for follow-up examination after completed treatment for conditions other than malignant neoplasm: Secondary | ICD-10-CM

## 2018-03-10 DIAGNOSIS — M7061 Trochanteric bursitis, right hip: Secondary | ICD-10-CM | POA: Insufficient documentation

## 2018-03-10 DIAGNOSIS — M47897 Other spondylosis, lumbosacral region: Secondary | ICD-10-CM | POA: Diagnosis not present

## 2018-03-10 DIAGNOSIS — M792 Neuralgia and neuritis, unspecified: Secondary | ICD-10-CM

## 2018-03-10 DIAGNOSIS — F410 Panic disorder [episodic paroxysmal anxiety] without agoraphobia: Secondary | ICD-10-CM | POA: Insufficient documentation

## 2018-03-10 DIAGNOSIS — M488X6 Other specified spondylopathies, lumbar region: Secondary | ICD-10-CM | POA: Insufficient documentation

## 2018-03-10 DIAGNOSIS — Z87891 Personal history of nicotine dependence: Secondary | ICD-10-CM | POA: Insufficient documentation

## 2018-03-10 DIAGNOSIS — M545 Low back pain: Secondary | ICD-10-CM | POA: Diagnosis not present

## 2018-03-10 DIAGNOSIS — D519 Vitamin B12 deficiency anemia, unspecified: Secondary | ICD-10-CM | POA: Diagnosis not present

## 2018-03-10 DIAGNOSIS — I10 Essential (primary) hypertension: Secondary | ICD-10-CM | POA: Insufficient documentation

## 2018-03-10 DIAGNOSIS — M810 Age-related osteoporosis without current pathological fracture: Secondary | ICD-10-CM | POA: Diagnosis not present

## 2018-03-10 DIAGNOSIS — M5388 Other specified dorsopathies, sacral and sacrococcygeal region: Secondary | ICD-10-CM | POA: Diagnosis not present

## 2018-03-10 DIAGNOSIS — M47898 Other spondylosis, sacral and sacrococcygeal region: Secondary | ICD-10-CM | POA: Diagnosis not present

## 2018-03-10 DIAGNOSIS — K219 Gastro-esophageal reflux disease without esophagitis: Secondary | ICD-10-CM | POA: Insufficient documentation

## 2018-03-10 DIAGNOSIS — I2510 Atherosclerotic heart disease of native coronary artery without angina pectoris: Secondary | ICD-10-CM | POA: Insufficient documentation

## 2018-03-10 DIAGNOSIS — F329 Major depressive disorder, single episode, unspecified: Secondary | ICD-10-CM | POA: Diagnosis not present

## 2018-03-10 DIAGNOSIS — M7918 Myalgia, other site: Secondary | ICD-10-CM

## 2018-03-10 DIAGNOSIS — Z9049 Acquired absence of other specified parts of digestive tract: Secondary | ICD-10-CM | POA: Insufficient documentation

## 2018-03-10 DIAGNOSIS — Z961 Presence of intraocular lens: Secondary | ICD-10-CM | POA: Insufficient documentation

## 2018-03-10 DIAGNOSIS — M533 Sacrococcygeal disorders, not elsewhere classified: Secondary | ICD-10-CM | POA: Insufficient documentation

## 2018-03-10 DIAGNOSIS — Z8249 Family history of ischemic heart disease and other diseases of the circulatory system: Secondary | ICD-10-CM | POA: Insufficient documentation

## 2018-03-10 DIAGNOSIS — N83202 Unspecified ovarian cyst, left side: Secondary | ICD-10-CM | POA: Insufficient documentation

## 2018-03-10 DIAGNOSIS — Z791 Long term (current) use of non-steroidal anti-inflammatories (NSAID): Secondary | ICD-10-CM | POA: Insufficient documentation

## 2018-03-10 DIAGNOSIS — G43909 Migraine, unspecified, not intractable, without status migrainosus: Secondary | ICD-10-CM | POA: Insufficient documentation

## 2018-03-10 DIAGNOSIS — M4726 Other spondylosis with radiculopathy, lumbar region: Secondary | ICD-10-CM | POA: Insufficient documentation

## 2018-03-10 DIAGNOSIS — D649 Anemia, unspecified: Secondary | ICD-10-CM | POA: Insufficient documentation

## 2018-03-10 DIAGNOSIS — Z79899 Other long term (current) drug therapy: Secondary | ICD-10-CM | POA: Insufficient documentation

## 2018-03-10 DIAGNOSIS — G629 Polyneuropathy, unspecified: Secondary | ICD-10-CM | POA: Insufficient documentation

## 2018-03-10 DIAGNOSIS — E785 Hyperlipidemia, unspecified: Secondary | ICD-10-CM | POA: Insufficient documentation

## 2018-03-10 DIAGNOSIS — Z981 Arthrodesis status: Secondary | ICD-10-CM | POA: Diagnosis not present

## 2018-03-10 DIAGNOSIS — F411 Generalized anxiety disorder: Secondary | ICD-10-CM | POA: Diagnosis not present

## 2018-03-10 MED ORDER — CYCLOBENZAPRINE HCL 10 MG PO TABS: 10.0000 mg | ORAL_TABLET | Freq: Three times a day (TID) | ORAL | 0 refills | Status: DC | PRN

## 2018-03-10 MED ORDER — OXYCODONE-ACETAMINOPHEN 7.5-325 MG PO TABS: 1.0000 | ORAL_TABLET | Freq: Every day | ORAL | 0 refills | Status: DC | PRN

## 2018-03-10 MED ORDER — GABAPENTIN 300 MG PO CAPS: 300.0000 mg | ORAL_CAPSULE | Freq: Two times a day (BID) | ORAL | 0 refills | Status: DC

## 2018-03-10 NOTE — Progress Notes (Signed)
Nursing Pain Medication Assessment:  Safety precautions to be maintained throughout the outpatient stay will include: orient to surroundings, keep bed in low position, maintain call bell within reach at all times, provide assistance with transfer out of bed and ambulation.  Medication Inspection Compliance: Pill count conducted under aseptic conditions, in front of the patient. Neither the pills nor the bottle was removed from the patient's sight at any time. Once count was completed pills were immediately returned to the patient in their original bottle.  Medication #1: Oxycodone/APAP Pill/Patch Count: 46 of 150 pills remain Pill/Patch Appearance: Markings consistent with prescribed medication Bottle Appearance: Standard pharmacy container. Clearly labeled. Filled Date: 8 / 22 / 2019 Last Medication intake:  Today  Medication #2: oxycodone 5mg  Pill/Patch Count: 12 of 15 pills remain Pill/Patch Appearance: Markings consistent with prescribed medication Bottle Appearance: Standard pharmacy container. Clearly labeled. Filled Date: 68 / 9 / 2018 Last Medication intake:  Today

## 2018-03-10 NOTE — Progress Notes (Signed)
   Postoperative Follow-up Patient presents post op from laparoscopic bilateral salpingo-oophorectomy 1 week ago for adnexal mass.  Subjective: The patient has been to the ER two times since her surgery with no abnormal findings noted. Patient reports marked improvement in her preop symptoms. Eating a regular diet without difficulty. she continue to have burning skin pain.    Activity: increasing slowly.  Objective: Vitals:   03/10/18 0954  BP: 122/74   Vital Signs: BP 122/74   Ht 5' (1.524 m)   Wt 143 lb (64.9 kg)   BMI 27.93 kg/m  Constitutional: Well nourished, well developed female in no acute distress.  HEENT: normal Skin: Warm and dry.  Extremity: no e/c/t  Abdomen: Soft, non-tender, normal bowel sounds; no bruits, organomegaly or masses. clean, dry, intact and no erythema, induration, warmth, and tenderness. resolving ecchymoses around left and right lower quadrant incision sites.  Assessment: 67 y.o. s/p laparoscopic bilateral salpingo-oophorectomy progressing well  Plan: Patient has done well after surgery with no apparent complications.  I have discussed the post-operative course to date, and the expected progress moving forward.  The patient understands what complications to be concerned about.  I will see the patient in routine follow up, or sooner if needed.    Activity plan: increase activity slowly over the next 6 weeks.  I reiterated my recommendation to discontinue estrogen supplementation given age and comorbid conditions. I will, however, leave this with her PCP.  Follow up, PRN. All pathology benign.   Prentice Docker, MD 03/10/2018, 10:00 AM   CC: Perrin Maltese, [address] [place], [place] [id number]

## 2018-03-10 NOTE — Progress Notes (Signed)
Patient's Name: Jacqueline Thornton  MRN: [id number]  Referring Provider: Lavera Guise, MD  DOB: May 14, 1951  PCP: Lavera Guise, MD  DOS: 03/10/2018  Note by: Vevelyn Francois NP  Service setting: Ambulatory outpatient  Specialty: Interventional Pain Management  Location: [facility] (AMB) Pain Management Facility    Patient type: Established    Primary Reason(s) for Visit: Encounter for prescription drug management. (Level of risk: moderate)  CC: Back Pain (lower)  HPI  Jacqueline Thornton is a 67 y.o. year old, female patient, who comes today for a medication management evaluation. She has Chronic pain syndrome; Anemia; Gout; Hyperlipidemia; Headache, migraine; Panic attack; Chronic low back pain (Secondary source of pain) (Bilateral) (midline to tailbone) (R>L); Spondylosis of lumbar spine; Lumbar annular disc tear (L4-5); Discogenic low back pain (L3-4 and L4-5); Lumbar facet hypertrophy; Lumbar facet syndrome (Bilateral) (R>L); Chronic neck pain; Cervical spondylosis; Hx of cervical spine surgery; Cervical spinal fusion (C6-7 interbody fusion); Coronary atherosclerosis; Long term current use of opiate analgesic; Encounter for therapeutic drug level monitoring; Uncomplicated opioid dependence ([place]); Opiate use (56.25 MME/Day); CAD in native artery; Anxiety, generalized; Chronic sacroiliac joint pain (Bilateral) (L>R); Chronic lumbar radicular pain (Primary Source of Pain) (Left) (S1 Dermatome); Long term prescription opiate use; Encounter for chronic pain management; Trochanteric bursitis of right hip; Chronic hip pain (Bilateral) (L>R); Neurogenic pain; Myofascial pain; Osteoarthritis of hip (Left); Arthrodesis status; Degenerative disk disease; Myalgia; Trochanteric bursitis of hip (Bilateral) (L>R); Age-related nuclear cataract of left eye; Posterior subcapsular polar age-related cataract of left eye; Pseudophakia of right eye; Pseudophakia of left eye; Spondylosis without myelopathy or radiculopathy, lumbosacral  region; Spondylosis without myelopathy or radiculopathy, sacral and sacrococcygeal region; Other specified dorsopathies, sacral and sacrococcygeal region; Age-related osteoporosis without current pathological fracture; Bone island of right femur; 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 Her primarily concern today is the Back Pain (lower)  Pain Assessment: Location: Lower, Left Back Radiating: not radiating today Onset: More than a month ago Duration: Chronic pain Quality: Aching, Constant, Stabbing, Pressure Severity: 3 /10 (subjective, self-reported pain score)  Note: Reported level is compatible with observation.                          Effect on ADL: "I dont want to do anything" Timing: Constant Modifying factors: heat, ice, medications BP: (!) 107/59  HR: 89  Ms. Kowalke was last scheduled for an appointment on 12/30/2017 for medication management. During today's appointment we reviewed Jacqueline Thornton's chronic pain status, as well as her outpatient medication regimen.  The patient  reports that she does not use drugs. Her body mass index is 27.47 kg/m.  Further details on both, my assessment(s), as well as the proposed treatment plan, please see below.  Controlled Substance Pharmacotherapy Assessment REMS (Risk Evaluation and Mitigation Strategy)  Analgesic:Oxycodone 7.5/325 one tablet 5 times a day. (56.25 mg/day) MME/day:56.25 mg/day Ignatius Specking, RN  03/10/2018  2:38 PM  Sign at close encounter Nursing Pain Medication Assessment:  Safety precautions to be maintained throughout the outpatient stay will include: orient to surroundings, keep bed in low position, maintain call bell within reach at all times, provide assistance with transfer out of bed and ambulation.  Medication Inspection Compliance: Pill count  conducted under aseptic conditions, in front of the patient. Neither the pills nor the bottle was removed  from the patient's sight at any time. Once count was completed pills were immediately returned to the patient in their original bottle.  Medication #1: Oxycodone/APAP Pill/Patch Count: 46 of 150 pills remain Pill/Patch Appearance: Markings consistent with prescribed medication Bottle Appearance: Standard pharmacy container. Clearly labeled. Filled Date: 8 / 22 / 2019 Last Medication intake:  Today  Medication #2: oxycodone 52m Pill/Patch Count: 12 of 15 pills remain Pill/Patch Appearance: Markings consistent with prescribed medication Bottle Appearance: Standard pharmacy container. Clearly labeled. Filled Date: 942/ 9 / 2018 Last Medication intake:  Today   Pharmacokinetics: Liberation and absorption (onset of action): WNL Distribution (time to peak effect): WNL Metabolism and excretion (duration of action): WNL         Pharmacodynamics: Desired effects: Analgesia: Ms. CWestbyreports >50% benefit. Functional ability: Patient reports that medication allows her to accomplish basic ADLs Clinically meaningful improvement in function (CMIF): Sustained CMIF goals met Perceived effectiveness: Described as relatively effective, allowing for increase in activities of daily living (ADL) Undesirable effects: Side-effects or Adverse reactions: Constipation she admits that her normal pattern is once per week. Monitoring: [place] PMP: Online review of the past 194-montheriod conducted. Compliant with practice rules and regulations Last UDS on record: Summary  Date Value Ref Range Status  12/10/2017 FINAL  Final    Comment:    ==================================================================== TOXASSURE SELECT 13 (MW) ==================================================================== Test                             Result       Flag       Units Drug Present and Declared for Prescription  Verification   Oxycodone                      4393         EXPECTED   ng/mg creat   Oxymorphone                    1844         EXPECTED   ng/mg creat   Noroxycodone                   4180         EXPECTED   ng/mg creat   Noroxymorphone                 376          EXPECTED   ng/mg creat    Sources of oxycodone are scheduled prescription medications.    Oxymorphone, noroxycodone, and noroxymorphone are expected    metabolites of oxycodone. Oxymorphone is also available as a    scheduled prescription medication. ==================================================================== Test                      Result    Flag   Units      Ref Range   Creatinine              59               mg/dL      >=20 ==================================================================== Declared Medications:  The flagging and interpretation on this report are based on the  following declared medications.  Unexpected results may arise from  inaccuracies in the declared medications.  **Note: The testing scope of this panel includes these medications:  Oxycodone (Percocet)  **Note: The testing scope of this panel does not  include following  reported medications:  Acetaminophen (Percocet)  Acetaminophen (Tylenol)  Cyclobenzaprine  Duloxetine  Enalapril  Fluticasone (Flonase)  Gabapentin  Magnesium Oxide  Melatonin  Omeprazole (Nexium)  Rosuvastatin (Crestor)  Vitamin D ==================================================================== For clinical consultation, please call [phone]. ====================================================================    UDS interpretation: Compliant          Medication Assessment Form: Reviewed. Patient indicates being compliant with therapy Treatment compliance: Compliant Risk Assessment Profile: Aberrant behavior: See prior evaluations. None observed or detected today Comorbid factors increasing risk of overdose: See prior notes. No additional risks  detected today Opioid risk tool (ORT) (Total Score):   Personal History of Substance Abuse (SUD-Substance use disorder):  Alcohol:    Illegal Drugs:    Rx Drugs:    ORT Risk Level calculation:   Risk of substance use disorder (SUD): Low  ORT Scoring interpretation table:  Score <3 = Low Risk for SUD  Score between 4-7 = Moderate Risk for SUD  Score >8 = High Risk for Opioid Abuse   Risk Mitigation Strategies:  Patient Counseling: Covered Patient-Prescriber Agreement (PPA): Present and active  Notification to other healthcare providers: Done  Pharmacologic Plan: No change in therapy, at this time.             Laboratory Chemistry  Inflammation Markers (CRP: Acute Phase) (ESR: Chronic Phase) Lab Results  Component Value Date   CRP 4.8 12/05/2016   ESRSEDRATE 2 12/05/2016                         Rheumatology Markers No results found for: RF, ANA, LABURIC, URICUR, LYMEIGGIGMAB, LYMEABIGMQN, HLAB27                      Renal Function Markers Lab Results  Component Value Date   BUN 15 03/08/2018   CREATININE 0.97 03/08/2018   BCR 9 (L) 12/05/2016   GFRAA >60 03/08/2018   GFRNONAA 60 (L) 03/08/2018                             Hepatic Function Markers Lab Results  Component Value Date   AST 14 (L) 03/08/2018   ALT 14 03/08/2018   ALBUMIN 3.3 (L) 03/08/2018   ALKPHOS 41 03/08/2018                        Electrolytes Lab Results  Component Value Date   NA 138 03/08/2018   K 3.9 03/08/2018   CL 104 03/08/2018   CALCIUM 8.4 (L) 03/08/2018   MG 2.2 12/05/2016                        Neuropathy Markers Lab Results  Component Value Date   VITAMINB12 202 (L) 12/05/2016                        CNS Tests No results found for: SDES, GRAMSTAIN, CULT, COLORCSF, APPEARCSF, RBCCOUNTCSF, WBCCSF, POLYSCSF, LYMPHSCSF, EOSCSF, PROTEINCSF, GLUCCSF, JCVIRUS, CSFOLI, IGGCSF, IGGALB, IGGIND                      Bone Pathology Markers Lab Results  Component Value Date    25OHVITD1 50 12/05/2016   25OHVITD2 <1.0 12/05/2016   25OHVITD3 49 12/05/2016  Coagulation Parameters Lab Results  Component Value Date   INR 1.0 03/02/2014   LABPROT 12.7 03/02/2014   PLT 210 03/08/2018                        Cardiovascular Markers Lab Results  Component Value Date   HGB 12.7 03/08/2018   HCT 37.2 03/08/2018                         CA Markers No results found for: CEA, CA125, LABCA2                      Note: Lab results reviewed.  Recent Diagnostic Imaging Results  CT ABDOMEN PELVIS W CONTRAST CLINICAL DATA:  67 year old female with continued RIGHT abdominal and pelvic pain following oophorectomy on 03/03/2018.  EXAM: CT ABDOMEN AND PELVIS WITH CONTRAST  TECHNIQUE: Multidetector CT imaging of the abdomen and pelvis was performed using the standard protocol following bolus administration of intravenous contrast.  CONTRAST:  151m ISOVUE-300 IOPAMIDOL (ISOVUE-300) INJECTION 61%  COMPARISON:  04/16/2011 lumbar spine CT  FINDINGS: Lower chest: No acute abnormality.  Hepatobiliary: The liver is unremarkable except for a few small cysts. Patient is status post cholecystectomy.  Pancreas: Atrophic pancreas noted without other significant abnormality  Spleen: Unremarkable  Adrenals/Urinary Tract: The kidneys, adrenal glands and bladder are unremarkable. No evidence of ureteral or bladder injury.  Stomach/Bowel: Stomach is within normal limits. No evidence of bowel wall thickening, distention, or inflammatory changes.  Vascular/Lymphatic: Aortic atherosclerosis. No enlarged abdominal or pelvic lymph nodes.  Reproductive: Status post hysterectomy. No adnexal masses.  Other: No abscess, ascites, pneumoperitoneum or hematoma. No significant subcutaneous/abdominal wall abnormalities identified.  Musculoskeletal: No acute or suspicious abnormalities. L5-S1 degenerative disc disease/spondylosis again  noted.  IMPRESSION: 1. No acute abnormality.  No ascites, abscess or hematoma. 2.  Aortic Atherosclerosis (ICD10-I70.0).  Electronically Signed   By: JMargarette CanadaM.D.   On: 03/08/2018 23:41  Complexity Note: Imaging results reviewed. Results shared with Ms. Hetz, using Layman's terms.                         Meds   Current Outpatient Medications:  .  Cholecalciferol (VITAMIN D3) 50000 units CAPS, Take 50,000 Units by mouth every Wednesday. , Disp: , Rfl: 3 .  Cyanocobalamin (VITAMIN B-12) 5000 MCG SUBL, Place 1 tablet (5,000 mcg total) under the tongue daily., Disp: 180 tablet, Rfl: 1 .  DULoxetine (CYMBALTA) 60 MG capsule, Take 60 mg by mouth at bedtime. , Disp: , Rfl:  .  enalapril (VASOTEC) 5 MG tablet, Take 5 mg by mouth daily., Disp: , Rfl:  .  esomeprazole (NEXIUM) 20 MG capsule, Take 20 mg by mouth daily at 12 noon., Disp: , Rfl:  .  ibuprofen (ADVIL,MOTRIN) 600 MG tablet, Take 1 tablet (600 mg total) by mouth every 6 (six) hours as needed for mild pain or cramping., Disp: 30 tablet, Rfl: 0 .  Melatonin 3 MG TABS, Take 3 mg by mouth at bedtime as needed (for sleep). , Disp: , Rfl:  .  oxyCODONE (ROXICODONE) 5 MG immediate release tablet, Take 1 tablet (5 mg total) by mouth every 6 (six) hours as needed for breakthrough pain., Disp: 20 tablet, Rfl: 0 .  oxyCODONE (ROXICODONE) 5 MG immediate release tablet, Take 1 tablet (5 mg total) by mouth every 6 (six) hours as needed for severe pain., Disp:  15 tablet, Rfl: 0 .  rosuvastatin (CRESTOR) 5 MG tablet, Take 5 mg by mouth daily. , Disp: , Rfl:  .  [START ON 03/21/2018] cyclobenzaprine (FLEXERIL) 10 MG tablet, Take 1 tablet (10 mg total) by mouth 3 (three) times daily as needed for muscle spasms., Disp: 270 tablet, Rfl: 0 .  [START ON 03/21/2018] gabapentin (NEURONTIN) 300 MG capsule, Take 1 capsule (300 mg total) by mouth 2 (two) times daily., Disp: 180 capsule, Rfl: 0 .  [START ON 05/22/2018] oxyCODONE-acetaminophen (PERCOCET)  7.5-325 MG tablet, Take 1 tablet by mouth 5 (five) times daily as needed for severe pain., Disp: 150 tablet, Rfl: 0 .  [START ON 04/20/2018] oxyCODONE-acetaminophen (PERCOCET) 7.5-325 MG tablet, Take 1 tablet by mouth 5 (five) times daily as needed for severe pain., Disp: 150 tablet, Rfl: 0 .  [START ON 03/21/2018] oxyCODONE-acetaminophen (PERCOCET) 7.5-325 MG tablet, Take 1 tablet by mouth 5 (five) times daily as needed for severe pain., Disp: 150 tablet, Rfl: 0  ROS  Constitutional: Denies any fever or chills Gastrointestinal: No reported hemesis, hematochezia, vomiting, or acute GI distress Musculoskeletal: Denies any acute onset joint swelling, redness, loss of ROM, or weakness Neurological: No reported episodes of acute onset apraxia, aphasia, dysarthria, agnosia, amnesia, paralysis, loss of coordination, or loss of consciousness  Allergies  Ms. Cloer has No Known Allergies.  [place]  Drug: Ms. Szafran  reports that she does not use drugs. Alcohol:  reports that she does not drink alcohol. Tobacco:  reports that she has quit smoking. She has never used smokeless tobacco. Medical:  has a past medical history of Absolute anemia (04/11/2015), Acute postoperative pain (08/07/2017), Angina pectoris ([place]), Anxiety, Arthritis, Arthropathy of sacroiliac joint (04/11/2015), Atypical face pain (04/24/2015), CAD (coronary artery disease), Chronic back pain, Depression, Fibromyalgia, GERD (gastroesophageal reflux disease), 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) (04/11/2015), H/O: hysterectomy (1979), Heart murmur, Hyperlipidemia, Hypertension, Low back pain (04/06/2015), Lumbar radicular pain (04/18/2015), Migraine, Narrowing of intervertebral disc space (04/11/2015), Sacroiliac joint pain (04/11/2015), and Spine disorder. Surgical: Ms. Steward  has a past surgical history that includes Cholecystectomy; Abdominal hysterectomy; Appendectomy; Neck surgery; Shoulder surgery; Colonoscopy with propofol (N/A,  07/19/2015); caract surger; Laparoscopic salpingo oophorectomy (Bilateral, 03/03/2018); and Cystoscopy (03/03/2018). Family: family history includes Colon cancer in her maternal grandmother and mother; Diabetes in her father and mother; Hypertension in her sister; Stroke in her mother.  Constitutional Exam  General appearance: Well nourished, well developed, and well hydrated. In no apparent acute distress Vitals:   03/10/18 1421  BP: (!) 107/59  Pulse: 89  Resp: (!) 97  Temp: 98.1 F (36.7 C)  Weight: 143 lb (64.9 kg)  Height: 5' 0.5" (1.537 m)   BMI Assessment: Estimated body mass index is 27.47 kg/m as calculated from the following:   Height as of this encounter: 5' 0.5" (1.537 m).   Weight as of this encounter: 143 lb (64.9 kg). Psych/Mental status: Alert, oriented x 3 (person, place, & time)       Eyes: PERLA Respiratory: No evidence of acute respiratory distress  Cervical Spine Area Exam  Skin & Axial Inspection: Well healed scar from previous spine surgery detected Alignment: Symmetrical Functional ROM: Unrestricted ROM      Stability: No instability detected Muscle Tone/Strength: Functionally intact. No obvious neuro-muscular anomalies detected. Sensory (Neurological): Unimpaired Palpation: No palpable anomalies              Upper Extremity (UE) Exam    Side: Right upper extremity  Side: Left upper extremity  Skin &  Extremity Inspection: Skin color, temperature, and hair growth are WNL. No peripheral edema or cyanosis. No masses, redness, swelling, asymmetry, or associated skin lesions. No contractures.  Skin & Extremity Inspection: Skin color, temperature, and hair growth are WNL. No peripheral edema or cyanosis. No masses, redness, swelling, asymmetry, or associated skin lesions. No contractures.  Functional ROM: Unrestricted ROM          Functional ROM: Unrestricted ROM          Muscle Tone/Strength: Functionally intact. No obvious neuro-muscular anomalies detected.  Muscle  Tone/Strength: Functionally intact. No obvious neuro-muscular anomalies detected.  Sensory (Neurological): Unimpaired          Sensory (Neurological): Unimpaired          Palpation: No palpable anomalies              Palpation: No palpable anomalies              Provocative Test(s):  Phalen's test: deferred Tinel's test: deferred Apley's scratch test (touch opposite shoulder):  Action 1 (Across chest): deferred Action 2 (Overhead): deferred Action 3 (LB reach): deferred   Provocative Test(s):  Phalen's test: deferred Tinel's test: deferred Apley's scratch test (touch opposite shoulder):  Action 1 (Across chest): deferred Action 2 (Overhead): deferred Action 3 (LB reach): deferred    Thoracic Spine Area Exam  Skin & Axial Inspection: No masses, redness, or swelling Alignment: Symmetrical Functional ROM: Unrestricted ROM Stability: No instability detected Muscle Tone/Strength: Functionally intact. No obvious neuro-muscular anomalies detected. Sensory (Neurological): Unimpaired Muscle strength & Tone: No palpable anomalies  Lumbar Spine Area Exam  Skin & Axial Inspection: No masses, redness, or swelling Alignment: Symmetrical Functional ROM: Unrestricted ROM       Stability: No instability detected Muscle Tone/Strength: Functionally intact. No obvious neuro-muscular anomalies detected. Sensory (Neurological): Unimpaired Palpation: Non-tender         Gait & Posture Assessment  Ambulation: Unassisted Gait: Relatively normal for age and body habitus Posture: WNL   Lower Extremity Exam    Side: Right lower extremity  Side: Left lower extremity  Stability: No instability observed          Stability: No instability observed          Skin & Extremity Inspection: Skin color, temperature, and hair growth are WNL. No peripheral edema or cyanosis. No masses, redness, swelling, asymmetry, or associated skin lesions. No contractures.  Skin & Extremity Inspection: Skin color,  temperature, and hair growth are WNL. No peripheral edema or cyanosis. No masses, redness, swelling, asymmetry, or associated skin lesions. No contractures.  Functional ROM: Unrestricted ROM                  Functional ROM: Unrestricted ROM                  Muscle Tone/Strength: Functionally intact. No obvious neuro-muscular anomalies detected.  Muscle Tone/Strength: Functionally intact. No obvious neuro-muscular anomalies detected.  Sensory (Neurological): Unimpaired  Sensory (Neurological): Unimpaired  Palpation: No palpable anomalies  Palpation: No palpable anomalies   Assessment  Primary Diagnosis & Pertinent Problem List: The primary encounter diagnosis was Spondylosis of lumbar spine. Diagnoses of Cervical spondylosis, Myofascial pain, Neurogenic pain, and Chronic pain syndrome were also pertinent to this visit.  Status Diagnosis  Controlled Controlled Controlled 1. Spondylosis of lumbar spine   2. Cervical spondylosis   3. Myofascial pain   4. Neurogenic pain   5. Chronic pain syndrome     Problems updated and reviewed during  this visit: No problems updated. Plan of Care  Pharmacotherapy (Medications Ordered): Meds ordered this encounter  Medications  . cyclobenzaprine (FLEXERIL) 10 MG tablet    Sig: Take 1 tablet (10 mg total) by mouth 3 (three) times daily as needed for muscle spasms.    Dispense:  270 tablet    Refill:  0    Do not place this medication, or any other prescription from our practice, on "Automatic Refill". Patient may have prescription filled one day early if pharmacy is closed on scheduled refill date.    Order Specific Question:   Supervising Provider    Answer:   Milinda Pointer [phone]  . gabapentin (NEURONTIN) 300 MG capsule    Sig: Take 1 capsule (300 mg total) by mouth 2 (two) times daily.    Dispense:  180 capsule    Refill:  0    Do not add this medication to the electronic "Automatic Refill" notification system. Patient may have  prescription filled one day early if pharmacy is closed on scheduled refill date.    Order Specific Question:   Supervising Provider    Answer:   Milinda Pointer [phone]  . oxyCODONE-acetaminophen (PERCOCET) 7.5-325 MG tablet    Sig: Take 1 tablet by mouth 5 (five) times daily as needed for severe pain.    Dispense:  150 tablet    Refill:  0    Do not place this medicationon "Automatic Refill". Do not fill until:05/22/2018 To last until:06/21/2018    Order Specific Question:   Supervising Provider    Answer:   Milinda Pointer [phone]  . oxyCODONE-acetaminophen (PERCOCET) 7.5-325 MG tablet    Sig: Take 1 tablet by mouth 5 (five) times daily as needed for severe pain.    Dispense:  150 tablet    Refill:  0    Do not place this medication on "Automatic Refill". Do not fill until:04/20/2018 To last until:05/22/2018    Order Specific Question:   Supervising Provider    Answer:   Milinda Pointer [phone]  . DISCONTD: oxyCODONE-acetaminophen (PERCOCET) 7.5-325 MG tablet    Sig: Take 1 tablet by mouth 5 (five) times daily as needed for severe pain.    Dispense:  150 tablet    Refill:  0    Do not place this medication on "Automatic Refill". Do not fill until:03/21/2018 To last until:04/20/2018    Order Specific Question:   Supervising Provider    Answer:   Milinda Pointer [phone]  . oxyCODONE-acetaminophen (PERCOCET) 7.5-325 MG tablet    Sig: Take 1 tablet by mouth 5 (five) times daily as needed for severe pain.    Dispense:  150 tablet    Refill:  0    Do not place this medication on "Automatic Refill". Do not fill until:03/21/2018 To last until:04/20/2018    Order Specific Question:   Supervising Provider    Answer:   Milinda Pointer [[id number]]   New Prescriptions   No medications on file   Medications administered today: Kathlean L. Mcquilkin had no medications administered during this visit. Lab-work, procedure(s), and/or referral(s): No orders of the defined types were  placed in this encounter.  Imaging and/or referral(s): None  Interventional therapies: Planned, scheduled, and/or pending: Not at this time.   Encourage patient to use MiraLAX daily until she feels like she is having good regular bowel movements and then she can start using it every other day.   Considering:  Diagnostic caudal epidural steroid injection + diagnostic epidurogram Palliative Right sided  lumbar facet radiofrequency ablation + Left S-I Block Diagnostic cervical epidural steroid injection Diagnostic bilateral cervical facet block Possible bilateral cervical facet radiofrequencyablation  Diagnostic bilateral lumbar facet block Possible bilateral lumbar facet radiofrequencyablation  Diagnostic left L4-5 lumbar epidural steroid injection  Diagnostic left S1 selective nerve root block Diagnostic bilateral sacroiliac joint block Possible bilateral sacroiliac joint radiofrequencyablation  Diagnostic right trochanteric bursa injection   Palliative PRN treatment(s):  Palliativecervical epidural steroid injection  Palliativebilateral cervical facet block Palliativebilateral lumbar facet block Palliativeleft L4-5 lumbar epiduralsteroid injection  Palliativeleft caudal epidural steroid injection  Palliativeleft S1 selective nerve root block Palliativebilateral sacroiliac joint block Palliativeright trochanteric bursa injec      Provider-requested follow-up: Return in about 3 months (around 06/09/2018) for MedMgmt with Me Donella Stade [facility]).  Future Appointments  Date Time Provider [place]  03/24/2018 10:00 AM [facility]  06/09/2018 12:45 PM Vevelyn Francois, NP [facility] None   Primary Care Physician: Lavera Guise, MD Location: [facility] Note by: Vevelyn Francois NP Date: 03/10/2018; Time: 3:52 PM  Pain Score Disclaimer: We use the NRS-11 scale. This is a self-reported, subjective  measurement of pain severity with only modest accuracy. It is used primarily to identify changes within a particular patient. It must be understood that outpatient pain scales are significantly less accurate that those used for research, where they can be applied under ideal controlled circumstances with minimal exposure to variables. In reality, the score is likely to be a combination of pain intensity and pain affect, where pain affect describes the degree of emotional arousal or changes in action readiness caused by the sensory experience of pain. Factors such as social and work situation, setting, emotional state, anxiety levels, expectation, and prior pain experience may influence pain perception and show large inter-individual differences that may also be affected by time variables.  Patient instructions provided during this appointment: Patient Instructions  3 Rx for percocet to last until 06/23/2018 and Rx for flexeril to last until 06/19/2018 and Rx for gabapentin have been escribed to your pharmacy.___________________________________________________________________________________________  Medication Rules  Applies to: All patients receiving prescriptions (written or electronic).  Pharmacy of record: Pharmacy where electronic prescriptions will be sent. If written prescriptions are taken to a different pharmacy, please inform the nursing staff. The pharmacy listed in the electronic medical record should be the one where you would like electronic prescriptions to be sent.  Prescription refills: Only during scheduled appointments. Applies to both, written and electronic prescriptions.  NOTE: The following applies primarily to controlled substances (Opioid* Pain Medications).   Patient's responsibilities: 1. Pain Pills: Bring all pain pills to every appointment (except for procedure appointments). 2. Pill Bottles: Bring pills in original pharmacy bottle. Always bring newest bottle. Bring  bottle, even if empty. 3. Medication refills: You are responsible for knowing and keeping track of what medications you need refilled. The day before your appointment, write a list of all prescriptions that need to be refilled. Bring that list to your appointment and give it to the admitting nurse. Prescriptions will be written only during appointments. If you forget a medication, it will not be "Called in", "Faxed", or "electronically sent". You will need to get another appointment to get these prescribed. 4. Prescription Accuracy: You are responsible for carefully inspecting your prescriptions before leaving our office. Have the discharge nurse carefully go over each prescription with you, before taking them home. Make sure that your name is accurately spelled, that your address is correct.  Check the name and dose of your medication to make sure it is accurate. Check the number of pills, and the written instructions to make sure they are clear and accurate. Make sure that you are given enough medication to last until your next medication refill appointment. 5. Taking Medication: Take medication as prescribed. Never take more pills than instructed. Never take medication more frequently than prescribed. Taking less pills or less frequently is permitted and encouraged, when it comes to controlled substances (written prescriptions).  6. Inform other Doctors: Always inform, all of your healthcare providers, of all the medications you take. 7. Pain Medication from other Providers: You are not allowed to accept any additional pain medication from any other Doctor or Healthcare provider. There are two exceptions to this rule. (see below) In the event that you require additional pain medication, you are responsible for notifying us, as stated below. 8. Medication Agreement: You are responsible for carefully reading and following our Medication Agreement. This must be signed before receiving any prescriptions from our  practice. Safely store a copy of your signed Agreement. Violations to the Agreement will result in no further prescriptions. (Additional copies of our Medication Agreement are available upon request.) 9. Laws, Rules, & Regulations: All patients are expected to follow all Federal and [employer], [employer], Rules, [employer]. Ignorance of the Laws does not constitute a valid excuse. The use of any illegal substances is prohibited. 10. Adopted CDC guidelines & recommendations: Target dosing levels will be at or below 60 MME/day. Use of benzodiazepines** is not recommended.  Exceptions: There are only two exceptions to the rule of not receiving pain medications from other Healthcare Providers. 1. Exception #1 (Emergencies): In the event of an emergency (i.e.: accident requiring emergency care), you are allowed to receive additional pain medication. However, you are responsible for: As soon as you are able, call our office [phone], at any time of the day or night, and leave a message stating your name, the date and nature of the emergency, and the name and dose of the medication prescribed. In the event that your call is answered by a member of our staff, make sure to document and save the date, time, and the name of the person that took your information.  2. Exception #2 (Planned Surgery): In the event that you are scheduled by another doctor or dentist to have any type of surgery or procedure, you are allowed (for a period no longer than 30 days), to receive additional pain medication, for the acute post-op pain. However, in this case, you are responsible for picking up a copy of our "Post-op Pain Management for Surgeons" handout, and giving it to your surgeon or dentist. This document is available at our office, and does not require an appointment to obtain it. Simply go to our office during business hours (Monday-Thursday from 8:00 AM to 4:00 PM) (Friday 8:00 AM to 12:00 Noon) or if you have a  scheduled appointment with [place], prior to your surgery, and ask for it by name. In addition, you will need to provide [place] with your name, name of your surgeon, type of surgery, and date of procedure or surgery.  *Opioid medications include: morphine, codeine, oxycodone, oxymorphone, hydrocodone, hydromorphone, meperidine, tramadol, tapentadol, buprenorphine, fentanyl, methadone. **Benzodiazepine medications include: diazepam (Valium), alprazolam (Xanax), clonazepam (Klonopine), lorazepam (Ativan), clorazepate (Tranxene), chlordiazepoxide (Librium), estazolam (Prosom), oxazepam (Serax), temazepam (Restoril), triazolam (Halcion) (Last updated: 08/28/2017) ____________________________________________________________________________________________

## 2018-03-10 NOTE — Patient Instructions (Addendum)
3 Rx for percocet to last until 06/23/2018 and Rx for flexeril to last until 06/19/2018 and Rx for gabapentin have been escribed to your pharmacy.___________________________________________________________________________________________  Medication Rules  Applies to: All patients receiving prescriptions (written or electronic).  Pharmacy of record: Pharmacy where electronic prescriptions will be sent. If written prescriptions are taken to a different pharmacy, please inform the nursing staff. The pharmacy listed in the electronic medical record should be the one where you would like electronic prescriptions to be sent.  Prescription refills: Only during scheduled appointments. Applies to both, written and electronic prescriptions.  NOTE: The following applies primarily to controlled substances (Opioid* Pain Medications).   Patient's responsibilities: 1. Pain Pills: Bring all pain pills to every appointment (except for procedure appointments). 2. Pill Bottles: Bring pills in original pharmacy bottle. Always bring newest bottle. Bring bottle, even if empty. 3. Medication refills: You are responsible for knowing and keeping track of what medications you need refilled. The day before your appointment, write a list of all prescriptions that need to be refilled. Bring that list to your appointment and give it to the admitting nurse. Prescriptions will be written only during appointments. If you forget a medication, it will not be "Called in", "Faxed", or "electronically sent". You will need to get another appointment to get these prescribed. 4. Prescription Accuracy: You are responsible for carefully inspecting your prescriptions before leaving our office. Have the discharge nurse carefully go over each prescription with you, before taking them home. Make sure that your name is accurately spelled, that your address is correct. Check the name and dose of your medication to make sure it is accurate. Check  the number of pills, and the written instructions to make sure they are clear and accurate. Make sure that you are given enough medication to last until your next medication refill appointment. 5. Taking Medication: Take medication as prescribed. Never take more pills than instructed. Never take medication more frequently than prescribed. Taking less pills or less frequently is permitted and encouraged, when it comes to controlled substances (written prescriptions).  6. Inform other Doctors: Always inform, all of your healthcare providers, of all the medications you take. 7. Pain Medication from other Providers: You are not allowed to accept any additional pain medication from any other Doctor or Healthcare provider. There are two exceptions to this rule. (see below) In the event that you require additional pain medication, you are responsible for notifying us, as stated below. 8. Medication Agreement: You are responsible for carefully reading and following our Medication Agreement. This must be signed before receiving any prescriptions from our practice. Safely store a copy of your signed Agreement. Violations to the Agreement will result in no further prescriptions. (Additional copies of our Medication Agreement are available upon request.) 9. Laws, Rules, & Regulations: All patients are expected to follow all Federal and [employer], [employer], Rules, [employer]. Ignorance of the Laws does not constitute a valid excuse. The use of any illegal substances is prohibited. 10. Adopted CDC guidelines & recommendations: Target dosing levels will be at or below 60 MME/day. Use of benzodiazepines** is not recommended.  Exceptions: There are only two exceptions to the rule of not receiving pain medications from other Healthcare Providers. 1. Exception #1 (Emergencies): In the event of an emergency (i.e.: accident requiring emergency care), you are allowed to receive additional pain medication. However, you are  responsible for: As soon as you are able, call our office [phone], at any time of the  day or night, and leave a message stating your name, the date and nature of the emergency, and the name and dose of the medication prescribed. In the event that your call is answered by a member of our staff, make sure to document and save the date, time, and the name of the person that took your information.  2. Exception #2 (Planned Surgery): In the event that you are scheduled by another doctor or dentist to have any type of surgery or procedure, you are allowed (for a period no longer than 30 days), to receive additional pain medication, for the acute post-op pain. However, in this case, you are responsible for picking up a copy of our "Post-op Pain Management for Surgeons" handout, and giving it to your surgeon or dentist. This document is available at our office, and does not require an appointment to obtain it. Simply go to our office during business hours (Monday-Thursday from 8:00 AM to 4:00 PM) (Friday 8:00 AM to 12:00 Noon) or if you have a scheduled appointment with [place], prior to your surgery, and ask for it by name. In addition, you will need to provide [place] with your name, name of your surgeon, type of surgery, and date of procedure or surgery.  *Opioid medications include: morphine, codeine, oxycodone, oxymorphone, hydrocodone, hydromorphone, meperidine, tramadol, tapentadol, buprenorphine, fentanyl, methadone. **Benzodiazepine medications include: diazepam (Valium), alprazolam (Xanax), clonazepam (Klonopine), lorazepam (Ativan), clorazepate (Tranxene), chlordiazepoxide (Librium), estazolam (Prosom), oxazepam (Serax), temazepam (Restoril), triazolam (Halcion) (Last updated: 08/28/2017) ____________________________________________________________________________________________

## 2018-03-11 NOTE — Anesthesia Postprocedure Evaluation (Signed)
Anesthesia Post Note  Patient: Jacqueline Thornton  Procedure(s) Performed: LAPAROSCOPIC SALPINGO OOPHORECTOMY (Bilateral ) CYSTOSCOPY  Patient location during evaluation: PACU Anesthesia Type: General Level of consciousness: awake and alert and oriented Pain management: pain level controlled Vital Signs Assessment: post-procedure vital signs reviewed and stable Respiratory status: spontaneous breathing Cardiovascular status: blood pressure returned to baseline Anesthetic complications: no     Last Vitals:  Vitals:   03/03/18 1600 03/03/18 1614  BP: (!) 110/43 (!) 106/53  Pulse: (!) 101 96  Resp: 16 16  Temp: 36.7 C   SpO2: 96% 100%    Last Pain:  Vitals:   03/04/18 0808  TempSrc:   PainSc: 2                  Ariel Dimitri

## 2018-05-08 ENCOUNTER — Ambulatory Visit
Admission: RE | Admit: 2018-05-08 | Discharge: 2018-05-08 | Disposition: A | Discharge status: Home / Self Care | Payer: Medicare Other | Source: Ambulatory Visit | Attending: Internal Medicine | Admitting: Internal Medicine

## 2018-05-08 DIAGNOSIS — Z1231 Encounter for screening mammogram for malignant neoplasm of breast: Secondary | ICD-10-CM

## 2018-05-14 ENCOUNTER — Ambulatory Visit
Admission: RE | Admit: 2018-05-14 | Discharge: 2018-05-14 | Disposition: A | Discharge status: Home / Self Care | Payer: Medicare Other | Source: Ambulatory Visit | Attending: Pain Medicine | Admitting: Pain Medicine

## 2018-05-14 ENCOUNTER — Encounter: Payer: Self-pay | Admitting: Pain Medicine

## 2018-05-14 ENCOUNTER — Ambulatory Visit (HOSPITAL_BASED_OUTPATIENT_CLINIC_OR_DEPARTMENT_OTHER): Payer: Medicare Other | Admitting: Pain Medicine

## 2018-05-14 ENCOUNTER — Other Ambulatory Visit: Payer: Self-pay

## 2018-05-14 VITALS — BP 101/86 | HR 86 | Temp 98.2°F | Resp 16 | Ht 60.0 in | Wt 146.0 lb

## 2018-05-14 DIAGNOSIS — Z791 Long term (current) use of non-steroidal anti-inflammatories (NSAID): Secondary | ICD-10-CM | POA: Diagnosis not present

## 2018-05-14 DIAGNOSIS — M47817 Spondylosis without myelopathy or radiculopathy, lumbosacral region: Secondary | ICD-10-CM | POA: Diagnosis not present

## 2018-05-14 DIAGNOSIS — Z79891 Long term (current) use of opiate analgesic: Secondary | ICD-10-CM | POA: Diagnosis not present

## 2018-05-14 DIAGNOSIS — M5136 Other intervertebral disc degeneration, lumbar region: Secondary | ICD-10-CM | POA: Insufficient documentation

## 2018-05-14 DIAGNOSIS — M47816 Spondylosis without myelopathy or radiculopathy, lumbar region: Secondary | ICD-10-CM | POA: Insufficient documentation

## 2018-05-14 DIAGNOSIS — M8938 Hypertrophy of bone, other site: Secondary | ICD-10-CM | POA: Diagnosis not present

## 2018-05-14 DIAGNOSIS — G8929 Other chronic pain: Secondary | ICD-10-CM | POA: Diagnosis not present

## 2018-05-14 DIAGNOSIS — Z9049 Acquired absence of other specified parts of digestive tract: Secondary | ICD-10-CM | POA: Insufficient documentation

## 2018-05-14 DIAGNOSIS — M545 Low back pain: Secondary | ICD-10-CM | POA: Diagnosis present

## 2018-05-14 DIAGNOSIS — M47897 Other spondylosis, lumbosacral region: Secondary | ICD-10-CM | POA: Diagnosis not present

## 2018-05-14 DIAGNOSIS — Z79899 Other long term (current) drug therapy: Secondary | ICD-10-CM | POA: Diagnosis not present

## 2018-05-14 DIAGNOSIS — M5030 Other cervical disc degeneration, unspecified cervical region: Secondary | ICD-10-CM | POA: Insufficient documentation

## 2018-05-14 MED ORDER — TRIAMCINOLONE ACETONIDE 40 MG/ML IJ SUSP
80.0000 mg | Freq: Once | INTRAMUSCULAR | Status: AC
  Administered 2018-05-14: 80 mg
  Filled 2018-05-14: qty 2

## 2018-05-14 MED ORDER — MIDAZOLAM HCL 5 MG/5ML IJ SOLN
1.0000 mg | INTRAMUSCULAR | Status: DC | PRN
  Administered 2018-05-14: 4 mg via INTRAVENOUS
  Filled 2018-05-14: qty 5

## 2018-05-14 MED ORDER — LACTATED RINGERS IV SOLN
1000.0000 mL | Freq: Once | INTRAVENOUS | Status: AC
  Administered 2018-05-14: 1000 mL via INTRAVENOUS

## 2018-05-14 MED ORDER — ROPIVACAINE HCL 2 MG/ML IJ SOLN
18.0000 mL | Freq: Once | INTRAMUSCULAR | Status: AC
  Administered 2018-05-14: 18 mL via PERINEURAL
  Filled 2018-05-14: qty 20

## 2018-05-14 MED ORDER — LIDOCAINE HCL 2 % IJ SOLN
20.0000 mL | Freq: Once | INTRAMUSCULAR | Status: AC
  Administered 2018-05-14: 400 mg
  Filled 2018-05-14: qty 40

## 2018-05-14 MED ORDER — FENTANYL CITRATE (PF) 100 MCG/2ML IJ SOLN
25.0000 ug | INTRAMUSCULAR | Status: DC | PRN
  Administered 2018-05-14: 100 ug via INTRAVENOUS
  Filled 2018-05-14: qty 2

## 2018-05-14 NOTE — Patient Instructions (Signed)

## 2018-05-14 NOTE — Progress Notes (Signed)
Patient's Name: Jacqueline Thornton  MRN: [id number]  Referring Provider: Lavera Guise, MD  DOB: Dec 16, 1950  PCP: Lavera Guise, MD  DOS: 05/14/2018  Note by: Gaspar Cola, MD  Service setting: Ambulatory outpatient  Specialty: Interventional Pain Management  Patient type: Established  Location: [facility] (AMB) Pain Management Facility  Visit type: Interventional Procedure   Primary Reason for Visit: Interventional Pain Management Treatment. CC: Back Pain  Procedure:          Anesthesia, Analgesia, Anxiolysis:  Type: Lumbar Facet, Medial Branch Block(s) #2  Primary Purpose: Diagnostic Region: Posterolateral Lumbosacral Spine Level: L2, L3, L4, L5, & S1 Medial Branch Level(s). Injecting these levels blocks the L3-4, L4-5, and L5-S1 lumbar facet joints. Laterality: Bilateral  Type: Moderate (Conscious) Sedation combined with Local Anesthesia Indication(s): Analgesia and Anxiety Route: Intravenous (IV) IV Access: Secured Sedation: Meaningful verbal contact was maintained at all times during the procedure  Local Anesthetic: Lidocaine 1-2%  Position: Prone   Indications: 1. Spondylosis without myelopathy or radiculopathy, lumbosacral region   2. Lumbar facet syndrome (Bilateral) (R>L)   3. Lumbar facet hypertrophy   4. Spondylosis of lumbar spine   5. DDD (degenerative disc disease), lumbar   6. Chronic low back pain (Secondary source of pain) (Bilateral) (midline to tailbone) (R>L)    Pain Score: Pre-procedure: 4 /10 Post-procedure: 0-No pain/10  Pre-op Assessment:  Jacqueline Thornton is a 67 y.o. (year old), female patient, seen today for interventional treatment. She  has a past surgical history that includes Cholecystectomy; Abdominal hysterectomy; Appendectomy; Neck surgery; Shoulder surgery; Colonoscopy with propofol (N/A, 07/19/2015); caract surger; Laparoscopic salpingo oophorectomy (Bilateral, 03/03/2018); and Cystoscopy (03/03/2018). Jacqueline Thornton has a current medication list which  includes the following prescription(s): vitamin d3, vitamin b-12, cyclobenzaprine, duloxetine, enalapril, esomeprazole, gabapentin, ibuprofen, melatonin, oxycodone, oxycodone, oxycodone-acetaminophen, oxycodone-acetaminophen, rosuvastatin, and oxycodone-acetaminophen, and the following Facility-Administered Medications: fentanyl and midazolam. Her primarily concern today is the Back Pain  Initial Vital Signs:  Pulse/HCG Rate: 86ECG Heart Rate: 75 Temp: 98.3 F (36.8 C) Resp: 16 BP: 132/77 SpO2: 98 %  BMI: Estimated body mass index is 28.51 kg/m as calculated from the following:   Height as of this encounter: 5' (1.524 m).   Weight as of this encounter: 146 lb (66.2 kg).  Risk Assessment: Allergies: Reviewed. She has No Known Allergies.  Allergy Precautions: None required Coagulopathies: Reviewed. None identified.  Blood-thinner therapy: None at this time Active Infection(s): Reviewed. None identified. Jacqueline Thornton is afebrile  Site Confirmation: Jacqueline Thornton was asked to confirm the procedure and laterality before marking the site Procedure checklist: Completed Consent: Before the procedure and under the influence of no sedative(s), amnesic(s), or anxiolytics, the patient was informed of the treatment options, risks and possible complications. To fulfill our ethical and legal obligations, as recommended by the American Medical Association's Code of Ethics, I have informed the patient of my clinical impression; the nature and purpose of the treatment or procedure; the risks, benefits, and possible complications of the intervention; the alternatives, including doing nothing; the risk(s) and benefit(s) of the alternative treatment(s) or procedure(s); and the risk(s) and benefit(s) of doing nothing. The patient was provided information about the general risks and possible complications associated with the procedure. These may include, but are not limited to: failure to achieve desired goals,  infection, bleeding, organ or nerve damage, allergic reactions, paralysis, and death. In addition, the patient was informed of those risks and complications associated to Spine-related procedures, such as failure to decrease pain; infection (  i.e.: Meningitis, epidural or intraspinal abscess); bleeding (i.e.: epidural hematoma, subarachnoid hemorrhage, or any other type of intraspinal or peri-dural bleeding); organ or nerve damage (i.e.: Any type of peripheral nerve, nerve root, or spinal cord injury) with subsequent damage to sensory, motor, and/or autonomic systems, resulting in permanent pain, numbness, and/or weakness of one or several areas of the body; allergic reactions; (i.e.: anaphylactic reaction); and/or death. Furthermore, the patient was informed of those risks and complications associated with the medications. These include, but are not limited to: allergic reactions (i.e.: anaphylactic or anaphylactoid reaction(s)); adrenal axis suppression; blood sugar elevation that in diabetics may result in ketoacidosis or comma; water retention that in patients with history of congestive heart failure may result in shortness of breath, pulmonary edema, and decompensation with resultant heart failure; weight gain; swelling or edema; medication-induced neural toxicity; particulate matter embolism and blood vessel occlusion with resultant organ, and/or nervous system infarction; and/or aseptic necrosis of one or more joints. Finally, the patient was informed that Medicine is not an exact science; therefore, there is also the possibility of unforeseen or unpredictable risks and/or possible complications that may result in a catastrophic outcome. The patient indicated having understood very clearly. We have given the patient no guarantees and we have made no promises. Enough time was given to the patient to ask questions, all of which were answered to the patient's satisfaction. Jacqueline Thornton has indicated that she  wanted to continue with the procedure. Attestation: I, the ordering provider, attest that I have discussed with the patient the benefits, risks, side-effects, alternatives, likelihood of achieving goals, and potential problems during recovery for the procedure that I have provided informed consent. Date  Time: 05/14/2018  8:58 AM  Pre-Procedure Preparation:  Monitoring: As per clinic protocol. Respiration, ETCO2, SpO2, BP, heart rate and rhythm monitor placed and checked for adequate function Safety Precautions: Patient was assessed for positional comfort and pressure points before starting the procedure. Time-out: I initiated and conducted the "Time-out" before starting the procedure, as per protocol. The patient was asked to participate by confirming the accuracy of the "Time Out" information. Verification of the correct person, site, and procedure were performed and confirmed by me, the nursing staff, and the patient. "Time-out" conducted as per Joint Commission's Universal Protocol (UP.01.01.01). Time: 1014  Description of Procedure:          Laterality: Bilateral. The procedure was performed in identical fashion on both sides. Levels:  L2, L3, L4, L5, & S1 Medial Branch Level(s) Area Prepped: Posterior Lumbosacral Region Prepping solution: ChloraPrep (2% chlorhexidine gluconate and 70% isopropyl alcohol) Safety Precautions: Aspiration looking for blood return was conducted prior to all injections. At no point did we inject any substances, as a needle was being advanced. Before injecting, the patient was told to immediately notify me if she was experiencing any new onset of "ringing in the ears, or metallic taste in the mouth". No attempts were made at seeking any paresthesias. Safe injection practices and needle disposal techniques used. Medications properly checked for expiration dates. SDV (single dose vial) medications used. After the completion of the procedure, all disposable equipment used  was discarded in the proper designated medical waste containers. Local Anesthesia: Protocol guidelines were followed. The patient was positioned over the fluoroscopy table. The area was prepped in the usual manner. The time-out was completed. The target area was identified using fluoroscopy. A 12-in long, straight, sterile hemostat was used with fluoroscopic guidance to locate the targets for each level blocked. Once  located, the skin was marked with an approved surgical skin marker. Once all sites were marked, the skin (epidermis, dermis, and hypodermis), as well as deeper tissues (fat, connective tissue and muscle) were infiltrated with a small amount of a short-acting local anesthetic, loaded on a 10cc syringe with a 25G, 1.5-in  Needle. An appropriate amount of time was allowed for local anesthetics to take effect before proceeding to the next step. Local Anesthetic: Lidocaine 2.0% The unused portion of the local anesthetic was discarded in the proper designated containers. Technical explanation of process:  L2 Medial Branch Nerve Block (MBB): The target area for the L2 medial branch is at the junction of the postero-lateral aspect of the superior articular process and the superior, posterior, and medial edge of the transverse process of L3. Under fluoroscopic guidance, a Quincke needle was inserted until contact was made with os over the superior postero-lateral aspect of the pedicular shadow (target area). After negative aspiration for blood, 0.5 mL of the nerve block solution was injected without difficulty or complication. The needle was removed intact. L3 Medial Branch Nerve Block (MBB): The target area for the L3 medial branch is at the junction of the postero-lateral aspect of the superior articular process and the superior, posterior, and medial edge of the transverse process of L4. Under fluoroscopic guidance, a Quincke needle was inserted until contact was made with os over the superior  postero-lateral aspect of the pedicular shadow (target area). After negative aspiration for blood, 0.5 mL of the nerve block solution was injected without difficulty or complication. The needle was removed intact. L4 Medial Branch Nerve Block (MBB): The target area for the L4 medial branch is at the junction of the postero-lateral aspect of the superior articular process and the superior, posterior, and medial edge of the transverse process of L5. Under fluoroscopic guidance, a Quincke needle was inserted until contact was made with os over the superior postero-lateral aspect of the pedicular shadow (target area). After negative aspiration for blood, 0.5 mL of the nerve block solution was injected without difficulty or complication. The needle was removed intact. L5 Medial Branch Nerve Block (MBB): The target area for the L5 medial branch is at the junction of the postero-lateral aspect of the superior articular process and the superior, posterior, and medial edge of the sacral ala. Under fluoroscopic guidance, a Quincke needle was inserted until contact was made with os over the superior postero-lateral aspect of the pedicular shadow (target area). After negative aspiration for blood, 0.5 mL of the nerve block solution was injected without difficulty or complication. The needle was removed intact. S1 Medial Branch Nerve Block (MBB): The target area for the S1 medial branch is at the posterior and inferior 6 o'clock position of the L5-S1 facet joint. Under fluoroscopic guidance, the Quincke needle inserted for the L5 MBB was redirected until contact was made with os over the inferior and postero aspect of the sacrum, at the 6 o' clock position under the L5-S1 facet joint (Target area). After negative aspiration for blood, 0.5 mL of the nerve block solution was injected without difficulty or complication. The needle was removed intact. Procedural Needles: 22-gauge, 3.5-inch, Quincke needles used for all  levels. Nerve block solution: 0.2% PF-Ropivacaine + Triamcinolone (40 mg/mL) diluted to a final concentration of 4 mg of Triamcinolone/mL of Ropivacaine The unused portion of the solution was discarded in the proper designated containers.  Once the entire procedure was completed, the treated area was cleaned, making sure to leave  some of the prepping solution back to take advantage of its long term bactericidal properties.   Illustration of the posterior view of the lumbar spine and the posterior neural structures. Laminae of L2 through S1 are labeled. DPRL5, dorsal primary ramus of L5; DPRS1, dorsal primary ramus of S1; DPR3, dorsal primary ramus of L3; FJ, facet (zygapophyseal) joint L3-L4; I, inferior articular process of L4; LB1, lateral branch of dorsal primary ramus of L1; IAB, inferior articular branches from L3 medial branch (supplies L4-L5 facet joint); IBP, intermediate branch plexus; MB3, medial branch of dorsal primary ramus of L3; NR3, third lumbar nerve root; S, superior articular process of L5; SAB, superior articular branches from L4 (supplies L4-5 facet joint also); TP3, transverse process of L3.  Vitals:   05/14/18 1023 05/14/18 1033 05/14/18 1043 05/14/18 1054  BP: 129/74 132/67 99/87 101/86  Pulse:      Resp: 11 16 17 16   Temp:  98.1 F (36.7 C)  98.2 F (36.8 C)  TempSrc:      SpO2: 93% 96% 100% 100%  Weight:      Height:         Start Time: 1014 hrs. End Time: 1022 hrs.  Imaging Guidance (Spinal):          Type of Imaging Technique: Fluoroscopy Guidance (Spinal) Indication(s): Assistance in needle guidance and placement for procedures requiring needle placement in or near specific anatomical locations not easily accessible without such assistance. Exposure Time: Please see nurses notes. Contrast: None used. Fluoroscopic Guidance: I was personally present during the use of fluoroscopy. "Tunnel Vision Technique" used to obtain the best possible view of the target  area. Parallax error corrected before commencing the procedure. "Direction-depth-direction" technique used to introduce the needle under continuous pulsed fluoroscopy. Once target was reached, antero-posterior, oblique, and lateral fluoroscopic projection used confirm needle placement in all planes. Images permanently stored in EMR. Interpretation: No contrast injected. I personally interpreted the imaging intraoperatively. Adequate needle placement confirmed in multiple planes. Permanent images saved into the patient's record.  Antibiotic Prophylaxis:   Anti-infectives (From admission, onward)   None     Indication(s): None identified  Post-operative Assessment:  Post-procedure Vital Signs:  Pulse/HCG Rate: 8684 Temp: 98.2 F (36.8 C) Resp: 16 BP: 101/86 SpO2: 100 %  EBL: None  Complications: No immediate post-treatment complications observed by team, or reported by patient.  Note: The patient tolerated the entire procedure well. A repeat set of vitals were taken after the procedure and the patient was kept under observation following institutional policy, for this type of procedure. Post-procedural neurological assessment was performed, showing return to baseline, prior to discharge. The patient was provided with post-procedure discharge instructions, including a section on how to identify potential problems. Should any problems arise concerning this procedure, the patient was given instructions to immediately contact us, at any time, without hesitation. In any case, we plan to contact the patient by telephone for a follow-up status report regarding this interventional procedure.  Comments:  No additional relevant information.  Plan of Care    Imaging Orders     DG C-Arm 1-60 Min-No Report  Procedure Orders     LUMBAR FACET(MEDIAL BRANCH NERVE BLOCK) MBNB  Medications ordered for procedure: Meds ordered this encounter  Medications  . lidocaine (XYLOCAINE) 2 % (with pres)  injection 400 mg  . midazolam (VERSED) 5 MG/5ML injection 1-2 mg    Make sure Flumazenil is available in the pyxis when using this medication. If oversedation occurs, administer  0.2 mg IV over 15 sec. If after 45 sec no response, administer 0.2 mg again over 1 min; may repeat at 1 min intervals; not to exceed 4 doses (1 mg)  . fentaNYL (SUBLIMAZE) injection 25-50 mcg    Make sure Narcan is available in the pyxis when using this medication. In the event of respiratory depression (RR< 8/min): Titrate NARCAN (naloxone) in increments of 0.1 to 0.2 mg IV at 2-3 minute intervals, until desired degree of reversal.  . lactated ringers infusion 1,000 mL  . ropivacaine (PF) 2 mg/mL (0.2%) (NAROPIN) injection 18 mL  . triamcinolone acetonide (KENALOG-40) injection 80 mg   Medications administered: We administered lidocaine, midazolam, fentaNYL, lactated ringers, ropivacaine (PF) 2 mg/mL (0.2%), and triamcinolone acetonide.  See the medical record for exact dosing, route, and time of administration.  Disposition: Discharge home  Discharge Date & Time: 05/14/2018; 1055 hrs.   Physician-requested Follow-up: Return for post-procedure eval (2 wks), w/ Dionisio David, NP.  Future Appointments  Date Time Provider [place]  06/09/2018 12:45 PM Vevelyn Francois, NP [facility] None   Primary Care Physician: Lavera Guise, MD Location: [facility] Outpatient Pain Management Facility Note by: Gaspar Cola, MD Date: 05/14/2018; Time: 11:52 AM  Disclaimer:  Medicine is not an exact science. The only guarantee in medicine is that nothing is guaranteed. It is important to note that the decision to proceed with this intervention was based on the information collected from the patient. The Data and conclusions were drawn from the patient's questionnaire, the interview, and the physical examination. Because the information was provided in large part by the patient, it cannot be guaranteed that it has not been  purposely or unconsciously manipulated. Every effort has been made to obtain as much relevant data as possible for this evaluation. It is important to note that the conclusions that lead to this procedure are derived in large part from the available data. Always take into account that the treatment will also be dependent on availability of resources and existing treatment guidelines, considered by other Pain Management Practitioners as being common knowledge and practice, at the time of the intervention. For Medico-Legal purposes, it is also important to point out that variation in procedural techniques and pharmacological choices are the acceptable norm. The indications, contraindications, technique, and results of the above procedure should only be interpreted and judged by a Board-Certified Interventional Pain Specialist with extensive familiarity and expertise in the same exact procedure and technique.

## 2018-05-15 ENCOUNTER — Telehealth: Payer: Self-pay

## 2018-05-15 NOTE — Telephone Encounter (Signed)
Post procedure phone call.  LM 

## 2018-05-21 ENCOUNTER — Telehealth: Payer: Self-pay

## 2018-05-21 NOTE — Telephone Encounter (Signed)
Pt called and states her percocet was filled on oct 21st and now she's trying to fill it today but they are telling her it's the 22nd, she is out of med's. Pharmacy told her to call and get ok to fill 1day early. Pt say;s Rx say's refill is the 21st. Please call pt.

## 2018-05-21 NOTE — Telephone Encounter (Signed)
Date on the prescription was entered incorrectly. Pharmacy notified, the Rx can be filled today. Jacqueline Thornton notified.

## 2018-05-26 NOTE — Telephone Encounter (Signed)
done

## 2018-06-09 ENCOUNTER — Other Ambulatory Visit: Payer: Self-pay

## 2018-06-09 ENCOUNTER — Ambulatory Visit: Payer: Medicare Other | Attending: Nurse Practitioner | Admitting: Nurse Practitioner

## 2018-06-09 ENCOUNTER — Encounter: Payer: Self-pay | Admitting: Nurse Practitioner

## 2018-06-09 VITALS — BP 125/69 | HR 91 | Temp 98.0°F | Resp 16 | Ht 60.5 in | Wt 142.0 lb

## 2018-06-09 DIAGNOSIS — E785 Hyperlipidemia, unspecified: Secondary | ICD-10-CM | POA: Diagnosis not present

## 2018-06-09 DIAGNOSIS — Z87891 Personal history of nicotine dependence: Secondary | ICD-10-CM | POA: Diagnosis not present

## 2018-06-09 DIAGNOSIS — I2510 Atherosclerotic heart disease of native coronary artery without angina pectoris: Secondary | ICD-10-CM | POA: Diagnosis not present

## 2018-06-09 DIAGNOSIS — Z5181 Encounter for therapeutic drug level monitoring: Secondary | ICD-10-CM | POA: Insufficient documentation

## 2018-06-09 DIAGNOSIS — M5416 Radiculopathy, lumbar region: Secondary | ICD-10-CM

## 2018-06-09 DIAGNOSIS — Z9049 Acquired absence of other specified parts of digestive tract: Secondary | ICD-10-CM | POA: Insufficient documentation

## 2018-06-09 DIAGNOSIS — Z79891 Long term (current) use of opiate analgesic: Secondary | ICD-10-CM | POA: Diagnosis not present

## 2018-06-09 DIAGNOSIS — M7918 Myalgia, other site: Secondary | ICD-10-CM | POA: Diagnosis not present

## 2018-06-09 DIAGNOSIS — Z90710 Acquired absence of both cervix and uterus: Secondary | ICD-10-CM | POA: Diagnosis not present

## 2018-06-09 DIAGNOSIS — M47816 Spondylosis without myelopathy or radiculopathy, lumbar region: Secondary | ICD-10-CM | POA: Insufficient documentation

## 2018-06-09 DIAGNOSIS — Z9889 Other specified postprocedural states: Secondary | ICD-10-CM | POA: Diagnosis not present

## 2018-06-09 DIAGNOSIS — M797 Fibromyalgia: Secondary | ICD-10-CM | POA: Diagnosis not present

## 2018-06-09 DIAGNOSIS — I10 Essential (primary) hypertension: Secondary | ICD-10-CM | POA: Diagnosis not present

## 2018-06-09 DIAGNOSIS — Z79899 Other long term (current) drug therapy: Secondary | ICD-10-CM | POA: Insufficient documentation

## 2018-06-09 DIAGNOSIS — G894 Chronic pain syndrome: Secondary | ICD-10-CM | POA: Diagnosis not present

## 2018-06-09 DIAGNOSIS — M792 Neuralgia and neuritis, unspecified: Secondary | ICD-10-CM

## 2018-06-09 DIAGNOSIS — G8929 Other chronic pain: Secondary | ICD-10-CM

## 2018-06-09 MED ORDER — OXYCODONE-ACETAMINOPHEN 7.5-325 MG PO TABS: 1.0000 | ORAL_TABLET | Freq: Every day | ORAL | 0 refills | Status: DC | PRN

## 2018-06-09 MED ORDER — CYCLOBENZAPRINE HCL 10 MG PO TABS: 10.0000 mg | ORAL_TABLET | Freq: Three times a day (TID) | ORAL | 0 refills | Status: DC | PRN

## 2018-06-09 MED ORDER — GABAPENTIN 300 MG PO CAPS: 300.0000 mg | ORAL_CAPSULE | Freq: Two times a day (BID) | ORAL | 0 refills | Status: DC

## 2018-06-09 NOTE — Progress Notes (Signed)
Patient's Name: Jacqueline Thornton  MRN: [id number]  Referring Provider: Lavera Guise, MD  DOB: 1950/07/11  PCP: Perrin Maltese, MD  DOS: 06/09/2018  Note by: Vevelyn Francois NP  Service setting: Ambulatory outpatient  Specialty: Interventional Pain Management  Location: [facility] (AMB) Pain Management Facility    Patient type: Established    Primary Reason(s) for Visit: Encounter for prescription drug management & post-procedure evaluation of chronic illness with mild to moderate exacerbation(Level of risk: moderate) CC: Back Pain (lower)  HPI  Jacqueline Thornton is a 67 y.o. year old, female patient, who comes today for a post-procedure evaluation and medication management. She has Chronic pain syndrome; Anemia; Gout; Hyperlipidemia; Headache, migraine; Panic attack; Chronic low back pain (Secondary source of pain) (Bilateral) (midline to tailbone) (R>L); Spondylosis of lumbar spine; Lumbar annular disc tear (L4-5); Discogenic low back pain (L3-4 and L4-5); Lumbar facet hypertrophy; Lumbar facet syndrome (Bilateral) (R>L); Chronic neck pain; Cervical spondylosis; Hx of cervical spine surgery; Cervical spinal fusion (C6-7 interbody fusion); Coronary atherosclerosis; Long term current use of opiate analgesic; Encounter for therapeutic drug level monitoring; Uncomplicated opioid dependence ([place]); Opiate use (56.25 MME/Day); CAD in native artery; Anxiety, generalized; Chronic sacroiliac joint pain (Bilateral) (L>R); Chronic lumbar radicular pain (Primary Source of Pain) (Left) (S1 Dermatome); Long term prescription opiate use; Encounter for chronic pain management; Trochanteric bursitis of right hip; Chronic hip pain (Bilateral) (L>R); Neurogenic pain; Myofascial pain; Osteoarthritis of hip (Left); Arthrodesis status; DDD (degenerative disc disease), lumbar; Myalgia; Trochanteric bursitis of hip (Bilateral) (L>R); Age-related nuclear cataract of left eye; Posterior subcapsular polar age-related cataract of left eye;  Pseudophakia of right eye; Pseudophakia of left eye; Spondylosis without myelopathy or radiculopathy, lumbosacral region; Spondylosis without myelopathy or radiculopathy, sacral and sacrococcygeal region; Other specified dorsopathies, sacral and sacrococcygeal region; Age-related osteoporosis without current pathological fracture; Bone island of right femur; Inflammatory spondylopathy of lumbar region [facility]); Left ovarian cyst; Vitamin B12 deficiency anemia; Vitamin B12 deficiency; Trigger point with back pain (Right); Fibromyalgia; and DDD (degenerative disc disease), cervical on their problem list. Her primarily concern today is the Back Pain (lower)  Pain Assessment: Location: Lower, Right, Left Back Radiating: hips/buttocks  down side of leg Onset: More than a month ago Duration: Chronic pain Quality: Aching, Burning Severity: 2 /10 (subjective, self-reported pain score)  Note: Reported level is compatible with observation.                          Effect on ADL: prolonged walking, standing, standing Timing: Constant Modifying factors: rest, heat, ice BP: 125/69  HR: 91  Jacqueline Thornton was last seen on 05/21/2018 for a procedure. During today's appointment we reviewed Jacqueline Thornton's post-procedure results, as well as her outpatient medication regimen.  Further details on both, my assessment(s), as well as the proposed treatment plan, please see below.  Controlled Substance Pharmacotherapy Assessment REMS (Risk Evaluation and Mitigation Strategy)  Analgesic:Oxycodone 7.5/325 one tablet 5 times a day. (56.25 mg/day) MME/day:56.25 mg/day Ignatius Specking, RN  06/09/2018  1:32 PM  Sign at close encounter Nursing Pain Medication Assessment:  Safety precautions to be maintained throughout the outpatient stay will include: orient to surroundings, keep bed in low position, maintain call bell within reach at all times, provide assistance with transfer out of bed and ambulation.  Medication  Inspection Compliance: Pill count conducted under aseptic conditions, in front of the patient. Neither the pills nor the bottle was removed from the patient's sight at  any time. Once count was completed pills were immediately returned to the patient in their original bottle.  Medication: Hydrocodone/APAP Pill/Patch Count: 50.5 out of 90 Pill/Patch Appearance: Markings consistent with prescribed medication Bottle Appearance: Standard pharmacy container. Clearly labeled. Filled Date: 70 / 21 / 2019 Last Medication intake:  Today   Pharmacokinetics: Liberation and absorption (onset of action): WNL Distribution (time to peak effect): WNL Metabolism and excretion (duration of action): WNL         Pharmacodynamics: Desired effects: Analgesia: Jacqueline Thornton reports >50% benefit. Functional ability: Patient reports that medication allows her to accomplish basic ADLs Clinically meaningful improvement in function (CMIF): Sustained CMIF goals met Perceived effectiveness: Described as relatively effective, allowing for increase in activities of daily living (ADL) Undesirable effects: Side-effects or Adverse reactions: None reported Monitoring: [place] PMP: Online review of the past 72-monthperiod conducted. Compliant with practice rules and regulations Last UDS on record: Summary  Date Value Ref Range Status  12/10/2017 FINAL  Final    Comment:    ==================================================================== TOXASSURE SELECT 13 (MW) ==================================================================== Test                             Result       Flag       Units Drug Present and Declared for Prescription Verification   Oxycodone                      4393         EXPECTED   ng/mg creat   Oxymorphone                    1844         EXPECTED   ng/mg creat   Noroxycodone                   4180         EXPECTED   ng/mg creat   Noroxymorphone                 376          EXPECTED   ng/mg creat     Sources of oxycodone are scheduled prescription medications.    Oxymorphone, noroxycodone, and noroxymorphone are expected    metabolites of oxycodone. Oxymorphone is also available as a    scheduled prescription medication. ==================================================================== Test                      Result    Flag   Units      Ref Range   Creatinine              59               mg/dL      >=20 ==================================================================== Declared Medications:  The flagging and interpretation on this report are based on the  following declared medications.  Unexpected results may arise from  inaccuracies in the declared medications.  **Note: The testing scope of this panel includes these medications:  Oxycodone (Percocet)  **Note: The testing scope of this panel does not include following  reported medications:  Acetaminophen (Percocet)  Acetaminophen (Tylenol)  Cyclobenzaprine  Duloxetine  Enalapril  Fluticasone (Flonase)  Gabapentin  Magnesium Oxide  Melatonin  Omeprazole (Nexium)  Rosuvastatin (Crestor)  Vitamin D ==================================================================== For clinical consultation, please call ([phone] ====================================================================    UDS interpretation: Compliant  Medication Assessment Form: Reviewed. Patient indicates being compliant with therapy Treatment compliance: Compliant Risk Assessment Profile: Aberrant behavior: CBD gummies used Comorbid factors increasing risk of overdose: See prior notes. No additional risks detected today Opioid risk tool (ORT) (Total Score): 0 Personal History of Substance Abuse (SUD-Substance use disorder):  Alcohol: Negative  Illegal Drugs: Negative  Rx Drugs: Negative  ORT Risk Level calculation: Low Risk Risk of substance use disorder (SUD): Low Opioid Risk Tool - 06/09/18 1300      Family History of  Substance Abuse   Alcohol  Negative    Illegal Drugs  Negative    Rx Drugs  Negative      Personal History of Substance Abuse   Alcohol  Negative    Illegal Drugs  Negative    Rx Drugs  Negative      Total Score   Opioid Risk Tool Scoring  0    Opioid Risk Interpretation  Low Risk      ORT Scoring interpretation table:  Score <3 = Low Risk for SUD  Score between 4-7 = Moderate Risk for SUD  Score >8 = High Risk for Opioid Abuse   Risk Mitigation Strategies:  Patient Counseling: Covered Patient-Prescriber Agreement (PPA): Present and active  Notification to other healthcare providers: Done  Pharmacologic Plan: No change in therapy, at this time.             Post-Procedure Assessment  05/14/2018 Procedure: Bilateral lumbar facet nerve block Pre-procedure pain score:  4/10 Post-procedure pain score: 0/10         Influential Factors: BMI: 27.28 kg/m Intra-procedural challenges: None observed.         Assessment challenges: None detected.              Reported side-effects: None.        Post-procedural adverse reactions or complications: None reported         Sedation: Please see nurses note. When no sedatives are used, the analgesic levels obtained are directly associated to the effectiveness of the local anesthetics. However, when sedation is provided, the level of analgesia obtained during the initial 1 hour following the intervention, is believed to be the result of a combination of factors. These factors may include, but are not limited to: 1. The effectiveness of the local anesthetics used. 2. The effects of the analgesic(s) and/or anxiolytic(s) used. 3. The degree of discomfort experienced by the patient at the time of the procedure. 4. The patients ability and reliability in recalling and recording the events. 5. The presence and influence of possible secondary gains and/or psychosocial factors. Reported result: Relief experienced during the 1st hour after the  procedure: 100 % (Ultra-Short Term Relief)            Interpretative annotation: Clinically appropriate result. Analgesia during this period is likely to be Local Anesthetic and/or IV Sedative (Analgesic/Anxiolytic) related.          Effects of local anesthetic: The analgesic effects attained during this period are directly associated to the localized infiltration of local anesthetics and therefore cary significant diagnostic value as to the etiological location, or anatomical origin, of the pain. Expected duration of relief is directly dependent on the pharmacodynamics of the local anesthetic used. Long-acting (4-6 hours) anesthetics used.  Reported result: Relief during the next 4 to 6 hour after the procedure: 100 % (Short-Term Relief)            Interpretative annotation: Clinically appropriate result. Analgesia during this period  is likely to be Local Anesthetic-related.          Long-term benefit: Defined as the period of time past the expected duration of local anesthetics (1 hour for short-acting and 4-6 hours for long-acting). With the possible exception of prolonged sympathetic blockade from the local anesthetics, benefits during this period are typically attributed to, or associated with, other factors such as analgesic sensory neuropraxia, antiinflammatory effects, or beneficial biochemical changes provided by agents other than the local anesthetics.  Reported result: Extended relief following procedure: 50 %(3 weeks) (Long-Term Relief)            Interpretative annotation: Clinically possible results. Good relief. No permanent benefit expected. Inflammation plays a part in the etiology to the pain.          Current benefits: Defined as reported results that persistent at this point in time.   Analgesia: 50 %            Function: Ms. Lebo reports improvement in function ROM: Ms. Parfitt reports improvement in ROM Interpretative annotation: Recurrence of symptoms.                 Interpretation: Results would suggest a successful intervention.                  Plan:  Please see "Plan of Care" for details.                Laboratory Chemistry  Inflammation Markers (CRP: Acute Phase) (ESR: Chronic Phase) Lab Results  Component Value Date   CRP 4.8 12/05/2016   ESRSEDRATE 2 12/05/2016                         Rheumatology Markers No results found for: RF, ANA, LABURIC, URICUR, LYMEIGGIGMAB, LYMEABIGMQN, HLAB27                      Renal Function Markers Lab Results  Component Value Date   BUN 15 03/08/2018   CREATININE 0.97 03/08/2018   BCR 9 (L) 12/05/2016   GFRAA >60 03/08/2018   GFRNONAA 60 (L) 03/08/2018                             Hepatic Function Markers Lab Results  Component Value Date   AST 14 (L) 03/08/2018   ALT 14 03/08/2018   ALBUMIN 3.3 (L) 03/08/2018   ALKPHOS 41 03/08/2018                        Electrolytes Lab Results  Component Value Date   NA 138 03/08/2018   K 3.9 03/08/2018   CL 104 03/08/2018   CALCIUM 8.4 (L) 03/08/2018   MG 2.2 12/05/2016                        Neuropathy Markers Lab Results  Component Value Date   VITAMINB12 202 (L) 12/05/2016                        CNS Tests No results found for: COLORCSF, APPEARCSF, RBCCOUNTCSF, WBCCSF, POLYSCSF, LYMPHSCSF, EOSCSF, PROTEINCSF, GLUCCSF, JCVIRUS, CSFOLI, IGGCSF                      Bone Pathology Markers Lab Results  Component Value Date   25OHVITD1 50 12/05/2016   25OHVITD2 <  1.0 12/05/2016   25OHVITD3 49 12/05/2016                         Coagulation Parameters Lab Results  Component Value Date   INR 1.0 03/02/2014   LABPROT 12.7 03/02/2014   PLT 210 03/08/2018                        Cardiovascular Markers Lab Results  Component Value Date   HGB 12.7 03/08/2018   HCT 37.2 03/08/2018                         CA Markers No results found for: CEA, CA125, LABCA2                      Note: Lab results reviewed.  Recent Diagnostic Imaging  Results  DG C-Arm 1-60 Min-No Report Fluoroscopy was utilized by the requesting physician.  No radiographic  interpretation.   Complexity Note: Imaging results reviewed. Results shared with Ms. Daher, using Layman's terms.                         Meds   Current Outpatient Medications:  .  Cholecalciferol (VITAMIN D3) 50000 units CAPS, Take 50,000 Units by mouth every Wednesday. , Disp: , Rfl: 3 .  Cyanocobalamin (VITAMIN B-12) 5000 MCG SUBL, Place 1 tablet (5,000 mcg total) under the tongue daily., Disp: 180 tablet, Rfl: 1 .  [START ON 06/21/2018] cyclobenzaprine (FLEXERIL) 10 MG tablet, Take 1 tablet (10 mg total) by mouth 3 (three) times daily as needed for muscle spasms., Disp: 270 tablet, Rfl: 0 .  DULoxetine (CYMBALTA) 60 MG capsule, Take 60 mg by mouth at bedtime. , Disp: , Rfl:  .  enalapril (VASOTEC) 5 MG tablet, Take 5 mg by mouth daily., Disp: , Rfl:  .  esomeprazole (NEXIUM) 20 MG capsule, Take 20 mg by mouth daily at 12 noon., Disp: , Rfl:  .  [START ON 06/21/2018] gabapentin (NEURONTIN) 300 MG capsule, Take 1 capsule (300 mg total) by mouth 2 (two) times daily., Disp: 180 capsule, Rfl: 0 .  ibuprofen (ADVIL,MOTRIN) 600 MG tablet, Take 1 tablet (600 mg total) by mouth every 6 (six) hours as needed for mild pain or cramping., Disp: 30 tablet, Rfl: 0 .  Lido-Menthol-Methyl Sal-Camph (CBD KINGS EX), Apply topically., Disp: , Rfl:  .  Melatonin 3 MG TABS, Take 3 mg by mouth at bedtime as needed (for sleep). , Disp: , Rfl:  .  oxyCODONE (ROXICODONE) 5 MG immediate release tablet, Take 1 tablet (5 mg total) by mouth every 6 (six) hours as needed for breakthrough pain., Disp: 20 tablet, Rfl: 0 .  oxyCODONE (ROXICODONE) 5 MG immediate release tablet, Take 1 tablet (5 mg total) by mouth every 6 (six) hours as needed for severe pain., Disp: 15 tablet, Rfl: 0 .  [START ON 08/23/2018] oxyCODONE-acetaminophen (PERCOCET) 7.5-325 MG tablet, Take 1 tablet by mouth 5 (five) times daily as needed  for severe pain., Disp: 150 tablet, Rfl: 0 .  rosuvastatin (CRESTOR) 5 MG tablet, Take 5 mg by mouth daily. , Disp: , Rfl:  .  [START ON 07/22/2018] oxyCODONE-acetaminophen (PERCOCET) 7.5-325 MG tablet, Take 1 tablet by mouth 5 (five) times daily as needed for severe pain., Disp: 150 tablet, Rfl: 0 .  [START ON 06/20/2018] oxyCODONE-acetaminophen (PERCOCET) 7.5-325 MG tablet, Take 1 tablet by  mouth 5 (five) times daily as needed for severe pain., Disp: 150 tablet, Rfl: 0  ROS  Constitutional: Denies any fever or chills Gastrointestinal: No reported hemesis, hematochezia, vomiting, or acute GI distress Musculoskeletal: Denies any acute onset joint swelling, redness, loss of ROM, or weakness Neurological: No reported episodes of acute onset apraxia, aphasia, dysarthria, agnosia, amnesia, paralysis, loss of coordination, or loss of consciousness  Allergies  Ms. Pringle has No Known Allergies.  [place]  Drug: Ms. Brodrick  reports that she does not use drugs. Alcohol:  reports that she does not drink alcohol. Tobacco:  reports that she has quit smoking. She has never used smokeless tobacco. Medical:  has a past medical history of Absolute anemia (04/11/2015), Acute postoperative pain (08/07/2017), Angina pectoris ([place]), Anxiety, Arthritis, Arthropathy of sacroiliac joint (04/11/2015), Atypical face pain (04/24/2015), CAD (coronary artery disease), Chronic back pain, Depression, Fibromyalgia, GERD (gastroesophageal reflux disease), 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) (04/11/2015), H/O: hysterectomy (1979), Heart murmur, Hyperlipidemia, Hypertension, Low back pain (04/06/2015), Lumbar radicular pain (04/18/2015), Migraine, Narrowing of intervertebral disc space (04/11/2015), Sacroiliac joint pain (04/11/2015), and Spine disorder. Surgical: Ms. Moten  has a past surgical history that includes Cholecystectomy; Abdominal hysterectomy; Appendectomy; Neck surgery; Shoulder surgery; Colonoscopy with propofol  (N/A, 07/19/2015); caract surger; Laparoscopic salpingo oophorectomy (Bilateral, 03/03/2018); and Cystoscopy (03/03/2018). Family: family history includes Colon cancer in her maternal grandmother and mother; Diabetes in her father and mother; Hypertension in her sister; Stroke in her mother.  Constitutional Exam  General appearance: Well nourished, well developed, and well hydrated. In no apparent acute distress Vitals:   06/09/18 1253  BP: 125/69  Pulse: 91  Resp: 16  Temp: 98 F (36.7 C)  SpO2: 98%  Weight: 142 lb (64.4 kg)  Height: 5' 0.5" (1.537 m)  Psych/Mental status: Alert, oriented x 3 (person, place, & time)       Eyes: PERLA Respiratory: No evidence of acute respiratory distress  Lumbar Spine Area Exam  Skin & Axial Inspection: No masses, redness, or swelling Alignment: Symmetrical Functional ROM: Unrestricted ROM       Stability: No instability detected Muscle Tone/Strength: Functionally intact. No obvious neuro-muscular anomalies detected. Sensory (Neurological): Unimpaired Palpation: Tender         Gait & Posture Assessment  Ambulation: Unassisted Gait: Relatively normal for age and body habitus Posture: WNL   Lower Extremity Exam    Side: Right lower extremity  Side: Left lower extremity  Stability: No instability observed          Stability: No instability observed          Skin & Extremity Inspection: Skin color, temperature, and hair growth are WNL. No peripheral edema or cyanosis. No masses, redness, swelling, asymmetry, or associated skin lesions. No contractures.  Skin & Extremity Inspection: Skin color, temperature, and hair growth are WNL. No peripheral edema or cyanosis. No masses, redness, swelling, asymmetry, or associated skin lesions. No contractures.  Functional ROM: Unrestricted ROM                  Functional ROM: Unrestricted ROM                  Muscle Tone/Strength: Functionally intact. No obvious neuro-muscular anomalies detected.  Muscle  Tone/Strength: Functionally intact. No obvious neuro-muscular anomalies detected.  Sensory (Neurological): Unimpaired        Sensory (Neurological): Unimpaired            Palpation: No palpable anomalies  Palpation: No palpable anomalies  Assessment  Primary Diagnosis & Pertinent Problem List: The primary encounter diagnosis was Spondylosis of lumbar spine. Diagnoses of Chronic lumbar radicular pain (Primary Source of Pain) (Left) (S1 Dermatome), Myofascial pain, Neurogenic pain, Chronic pain syndrome, and Long term prescription opiate use were also pertinent to this visit.  Status Diagnosis  Controlled Controlled Controlled 1. Spondylosis of lumbar spine   2. Chronic lumbar radicular pain (Primary Source of Pain) (Left) (S1 Dermatome)   3. Myofascial pain   4. Neurogenic pain   5. Chronic pain syndrome   6. Long term prescription opiate use     Problems updated and reviewed during this visit: No problems updated. Plan of Care  Pharmacotherapy (Medications Ordered): Meds ordered this encounter  Medications  . cyclobenzaprine (FLEXERIL) 10 MG tablet    Sig: Take 1 tablet (10 mg total) by mouth 3 (three) times daily as needed for muscle spasms.    Dispense:  270 tablet    Refill:  0    Do not place this medication, or any other prescription from our practice, on "Automatic Refill". Patient may have prescription filled one day early if pharmacy is closed on scheduled refill date.    Order Specific Question:   Supervising Provider    Answer:   Milinda Pointer [phone]  . gabapentin (NEURONTIN) 300 MG capsule    Sig: Take 1 capsule (300 mg total) by mouth 2 (two) times daily.    Dispense:  180 capsule    Refill:  0    Do not add this medication to the electronic "Automatic Refill" notification system. Patient may have prescription filled one day early if pharmacy is closed on scheduled refill date.    Order Specific Question:   Supervising Provider    Answer:   Milinda Pointer  [phone]  . oxyCODONE-acetaminophen (PERCOCET) 7.5-325 MG tablet    Sig: Take 1 tablet by mouth 5 (five) times daily as needed for severe pain.    Dispense:  150 tablet    Refill:  0    Do not place this medication, or any other prescription from our practice, on "Automatic Refill". Patient may have prescription filled one day early if pharmacy is closed on scheduled refill date.    Order Specific Question:   Supervising Provider    Answer:   Milinda Pointer [phone]  . oxyCODONE-acetaminophen (PERCOCET) 7.5-325 MG tablet    Sig: Take 1 tablet by mouth 5 (five) times daily as needed for severe pain.    Dispense:  150 tablet    Refill:  0    Do not place this medication, or any other prescription from our practice, on "Automatic Refill". Patient may have prescription filled one day early if pharmacy is closed on scheduled refill date.    Order Specific Question:   Supervising Provider    Answer:   Milinda Pointer [phone]  . oxyCODONE-acetaminophen (PERCOCET) 7.5-325 MG tablet    Sig: Take 1 tablet by mouth 5 (five) times daily as needed for severe pain.    Dispense:  150 tablet    Refill:  0    Do not place this medication, or any other prescription from our practice, on "Automatic Refill". Patient may have prescription filled one day early if pharmacy is closed on scheduled refill date.    Order Specific Question:   Supervising Provider    Answer:   Milinda Pointer [[id number]]   New Prescriptions   No medications on file   Medications administered today: Soledad L. Kidd had no  medications administered during this visit. Lab-work, procedure(s), and/or referral(s): Orders Placed This Encounter  Procedures  . ToxASSURE Select 13 (MW), Urine   Imaging and/or referral(s): None  Interventional therapies: Planned, scheduled, and/or pending: Not at this time.    Considering:  Diagnostic caudal epidural steroid injection + diagnostic epidurogram Palliative Right sided  lumbar facet radiofrequency ablation + Left S-I Block Diagnostic cervical epidural steroid injection Diagnostic bilateral cervical facet block Possible bilateral cervical facet radiofrequencyablation  Diagnostic bilateral lumbar facet block Possible bilateral lumbar facet radiofrequencyablation  Diagnostic left L4-5 lumbar epidural steroid injection  Diagnostic left S1 selective nerve root block Diagnostic bilateral sacroiliac joint block Possible bilateral sacroiliac joint radiofrequencyablation  Diagnostic right trochanteric bursa injection   Palliative PRN treatment(s):  Palliativecervical epidural steroid injection  Palliativebilateral cervical facet block Palliativebilateral lumbar facet block Palliativeleft L4-5 lumbar epiduralsteroid injection  Palliativeleft caudal epidural steroid injection  Palliativeleft S1 selective nerve root block Palliativebilateral sacroiliac joint block Palliativeright trochanteric bursa injec    Provider-requested follow-up: Return in about 3 months (around 09/08/2018) for MedMgmt.  Future Appointments  Date Time Provider [place]  09/09/2018  1:30 PM Vevelyn Francois, NP [facility] None   Primary Care Physician: Perrin Maltese, MD Location: [facility] Outpatient Pain Management Facility Note by: Vevelyn Francois NP Date: 06/09/2018; Time: 1:53 PM  Pain Score Disclaimer: We use the NRS-11 scale. This is a self-reported, subjective measurement of pain severity with only modest accuracy. It is used primarily to identify changes within a particular patient. It must be understood that outpatient pain scales are significantly less accurate that those used for research, where they can be applied under ideal controlled circumstances with minimal exposure to variables. In reality, the score is likely to be a combination of pain intensity and pain affect, where pain affect describes the degree of emotional arousal or changes in  action readiness caused by the sensory experience of pain. Factors such as social and work situation, setting, emotional state, anxiety levels, expectation, and prior pain experience may influence pain perception and show large inter-individual differences that may also be affected by time variables.  Patient instructions provided during this appointment: Patient Instructions   ____________________________________________________________________________________________  Medication Rules  Purpose: To inform patients, and their family members, of our rules and regulations.  Applies to: All patients receiving prescriptions (written or electronic).  Pharmacy of record: Pharmacy where electronic prescriptions will be sent. If written prescriptions are taken to a different pharmacy, please inform the nursing staff. The pharmacy listed in the electronic medical record should be the one where you would like electronic prescriptions to be sent.  Electronic prescriptions: In compliance with the [place] (STOP) Act of 2017 (Session Lanny Cramp [phone]), effective July 01, 2018, all controlled substances must be electronically prescribed. Calling prescriptions to the pharmacy will cease to exist.  Prescription refills: Only during scheduled appointments. Applies to all prescriptions.  NOTE: The following applies primarily to controlled substances (Opioid* Pain Medications).   Patient's responsibilities: 1. Pain Pills: Bring all pain pills to every appointment (except for procedure appointments). 2. Pill Bottles: Bring pills in original pharmacy bottle. Always bring the newest bottle. Bring bottle, even if empty. 3. Medication refills: You are responsible for knowing and keeping track of what medications you take and those you need refilled. The day before your appointment: write a list of all prescriptions that need to be refilled. The day of the appointment:  give the list to the admitting nurse. Prescriptions will  be written only during appointments. If you forget a medication: it will not be "Called in", "Faxed", or "electronically sent". You will need to get another appointment to get these prescribed. No early refills. Do not call asking to have your prescription filled early. 4. Prescription Accuracy: You are responsible for carefully inspecting your prescriptions before leaving our office. Have the discharge nurse carefully go over each prescription with you, before taking them home. Make sure that your name is accurately spelled, that your address is correct. Check the name and dose of your medication to make sure it is accurate. Check the number of pills, and the written instructions to make sure they are clear and accurate. Make sure that you are given enough medication to last until your next medication refill appointment. 5. Taking Medication: Take medication as prescribed. When it comes to controlled substances, taking less pills or less frequently than prescribed is permitted and encouraged. Never take more pills than instructed. Never take medication more frequently than prescribed.  6. Inform other Doctors: Always inform, all of your healthcare providers, of all the medications you take. 7. Pain Medication from other Providers: You are not allowed to accept any additional pain medication from any other Doctor or Healthcare provider. There are two exceptions to this rule. (see below) In the event that you require additional pain medication, you are responsible for notifying us, as stated below. 8. Medication Agreement: You are responsible for carefully reading and following our Medication Agreement. This must be signed before receiving any prescriptions from our practice. Safely store a copy of your signed Agreement. Violations to the Agreement will result in no further prescriptions. (Additional copies of our Medication Agreement are available  upon request.) 9. Laws, Rules, & Regulations: All patients are expected to follow all Federal and [employer], Rules, [employer]. Ignorance of the Laws does not constitute a valid excuse. The use of any illegal substances is prohibited. 10. Adopted CDC guidelines & recommendations: Target dosing levels will be at or below 60 MME/day. Use of benzodiazepines** is not recommended.  Exceptions: There are only two exceptions to the rule of not receiving pain medications from other Healthcare Providers. 1. Exception #1 (Emergencies): In the event of an emergency (i.e.: accident requiring emergency care), you are allowed to receive additional pain medication. However, you are responsible for: As soon as you are able, call our office [phone], at any time of the day or night, and leave a message stating your name, the date and nature of the emergency, and the name and dose of the medication prescribed. In the event that your call is answered by a member of our staff, make sure to document and save the date, time, and the name of the person that took your information.  2. Exception #2 (Planned Surgery): In the event that you are scheduled by another doctor or dentist to have any type of surgery or procedure, you are allowed (for a period no longer than 30 days), to receive additional pain medication, for the acute post-op pain. However, in this case, you are responsible for picking up a copy of our "Post-op Pain Management for Surgeons" handout, and giving it to your surgeon or dentist. This document is available at our office, and does not require an appointment to obtain it. Simply go to our office during business hours (Monday-Thursday from 8:00 AM to 4:00 PM) (Friday 8:00 AM to 12:00 Noon) or if you have a scheduled appointment with [place], prior to your  surgery, and ask for it by name. In addition, you will need to provide [place] with your name, name of your surgeon, type of surgery, and date of  procedure or surgery.  *Opioid medications include: morphine, codeine, oxycodone, oxymorphone, hydrocodone, hydromorphone, meperidine, tramadol, tapentadol, buprenorphine, fentanyl, methadone. **Benzodiazepine medications include: diazepam (Valium), alprazolam (Xanax), clonazepam (Klonopine), lorazepam (Ativan), clorazepate (Tranxene), chlordiazepoxide (Librium), estazolam (Prosom), oxazepam (Serax), temazepam (Restoril), triazolam (Halcion) (Last updated: 08/28/2017) ____________________________________________________________________________________________   ____________________________________________________________________________________________  CANNABIDIOL (AKA: CBD Oil or Pills)  Applies to: All patients receiving prescriptions of controlled substances (written and/or electronic).  General Information: Cannabidiol (CBD) was discovered in 35. It is one of some 113 identified cannabinoids in cannabis (Marijuana) plants, accounting for up to 40% of the plant's extract. As of 2018, preliminary clinical research on cannabidiol included studies of anxiety, cognition, movement disorders, and pain.  Cannabidiol is consummed in multiple ways, including inhalation of cannabis smoke or vapor, as an aerosol spray into the cheek, and by mouth. It may be supplied as CBD oil containing CBD as the active ingredient (no added tetrahydrocannabinol (THC) or terpenes), a full-plant CBD-dominant hemp extract oil, capsules, dried cannabis, or as a liquid solution. CBD is thought not have the same psychoactivity as THC, and may affect the actions of THC. Studies suggest that CBD may interact with different biological targets, including cannabinoid receptors and other neurotransmitter receptors. As of 2018 the mechanism of action for its biological effects has not been determined.  In the [place], cannabidiol has a limited approval by the Food and Drug Administration (FDA) for treatment of only two types  of epilepsy disorders. The side effects of long-term use of the drug include somnolence, decreased appetite, diarrhea, fatigue, malaise, weakness, sleeping problems, and others.  CBD remains a Schedule I drug prohibited for any use.  Legality: Some manufacturers ship CBD products nationally, an illegal action which the FDA has not enforced in 2018, with CBD remaining the subject of an FDA investigational new drug evaluation, and is not considered legal as a dietary supplement or food ingredient as of December 2018. Federal illegality has made it difficult historically to conduct research on CBD. CBD is openly sold in head shops and health food stores in some states where such sales have not been explicitly legalized.  Warning: Because it is not FDA approved for general use or treatment of pain, it is not required to undergo the same manufacturing controls as prescription drugs.  This means that the available cannabidiol (CBD) may be contaminated with THC.  If this is the case, it will trigger a positive urine drug screen (UDS) test for cannabinoids (Marijuana).  Because a positive UDS for illicit substances is a violation of our medication agreement, your opioid analgesics (pain medicine) may be permanently discontinued. (Last update: 09/18/2017) ____________________________________________________________________________________________   BMI Assessment: Estimated body mass index is 27.28 kg/m as calculated from the following:   Height as of this encounter: 5' 0.5" (1.537 m).   Weight as of this encounter: 142 lb (64.4 kg).  BMI interpretation table: BMI level Category Range association with higher incidence of chronic pain  <18 kg/m2 Underweight   18.5-24.9 kg/m2 Ideal body weight   25-29.9 kg/m2 Overweight Increased incidence by 20%  30-34.9 kg/m2 Obese (Class I) Increased incidence by 68%  35-39.9 kg/m2 Severe obesity (Class II) Increased incidence by 136%  >40 kg/m2 Extreme obesity (Class  III) Increased incidence by 254%   Patient's current BMI Ideal Body weight  Body mass index is 27.28 kg/m.  Ideal body weight: 46.6 kg (102 lb 13.5 oz) Adjusted ideal body weight: 53.8 kg (118 lb 8.1 oz)   BMI Readings from Last 4 Encounters:  06/09/18 27.28 kg/m  05/14/18 28.51 kg/m  03/10/18 27.47 kg/m  03/10/18 27.93 kg/m   Wt Readings from Last 4 Encounters:  06/09/18 142 lb (64.4 kg)  05/14/18 146 lb (66.2 kg)  03/10/18 143 lb (64.9 kg)  03/10/18 143 lb (64.9 kg)

## 2018-06-09 NOTE — Progress Notes (Signed)
Nursing Pain Medication Assessment:  Safety precautions to be maintained throughout the outpatient stay will include: orient to surroundings, keep bed in low position, maintain call bell within reach at all times, provide assistance with transfer out of bed and ambulation.  Medication Inspection Compliance: Pill count conducted under aseptic conditions, in front of the patient. Neither the pills nor the bottle was removed from the patient's sight at any time. Once count was completed pills were immediately returned to the patient in their original bottle.  Medication: Hydrocodone/APAP Pill/Patch Count: 50.5 out of 90 Pill/Patch Appearance: Markings consistent with prescribed medication Bottle Appearance: Standard pharmacy container. Clearly labeled. Filled Date: 52 / 21 / 2019 Last Medication intake:  Today

## 2018-06-09 NOTE — Patient Instructions (Addendum)
____________________________________________________________________________________________  Medication Rules  Purpose: To inform patients, and their family members, of our rules and regulations.  Applies to: All patients receiving prescriptions (written or electronic).  Pharmacy of record: Pharmacy where electronic prescriptions will be sent. If written prescriptions are taken to a different pharmacy, please inform the nursing staff. The pharmacy listed in the electronic medical record should be the one where you would like electronic prescriptions to be sent.  Electronic prescriptions: In compliance with the [place] Strengthen Opioid Misuse Prevention (STOP) Act of 2017 (Session Law 2017-74/H243), effective July 01, 2018, all controlled substances must be electronically prescribed. Calling prescriptions to the pharmacy will cease to exist.  Prescription refills: Only during scheduled appointments. Applies to all prescriptions.  NOTE: The following applies primarily to controlled substances (Opioid* Pain Medications).   Patient's responsibilities: 1. Pain Pills: Bring all pain pills to every appointment (except for procedure appointments). 2. Pill Bottles: Bring pills in original pharmacy bottle. Always bring the newest bottle. Bring bottle, even if empty. 3. Medication refills: You are responsible for knowing and keeping track of what medications you take and those you need refilled. The day before your appointment: write a list of all prescriptions that need to be refilled. The day of the appointment: give the list to the admitting nurse. Prescriptions will be written only during appointments. If you forget a medication: it will not be "Called in", "Faxed", or "electronically sent". You will need to get another appointment to get these prescribed. No early refills. Do not call asking to have your prescription filled early. 4. Prescription Accuracy: You are responsible for  carefully inspecting your prescriptions before leaving our office. Have the discharge nurse carefully go over each prescription with you, before taking them home. Make sure that your name is accurately spelled, that your address is correct. Check the name and dose of your medication to make sure it is accurate. Check the number of pills, and the written instructions to make sure they are clear and accurate. Make sure that you are given enough medication to last until your next medication refill appointment. 5. Taking Medication: Take medication as prescribed. When it comes to controlled substances, taking less pills or less frequently than prescribed is permitted and encouraged. Never take more pills than instructed. Never take medication more frequently than prescribed.  6. Inform other Doctors: Always inform, all of your healthcare providers, of all the medications you take. 7. Pain Medication from other Providers: You are not allowed to accept any additional pain medication from any other Doctor or Healthcare provider. There are two exceptions to this rule. (see below) In the event that you require additional pain medication, you are responsible for notifying us, as stated below. 8. Medication Agreement: You are responsible for carefully reading and following our Medication Agreement. This must be signed before receiving any prescriptions from our practice. Safely store a copy of your signed Agreement. Violations to the Agreement will result in no further prescriptions. (Additional copies of our Medication Agreement are available upon request.) 9. Laws, Rules, & Regulations: All patients are expected to follow all Federal and State Laws, Statutes, Rules, & Regulations. Ignorance of the Laws does not constitute a valid excuse. The use of any illegal substances is prohibited. 10. Adopted CDC guidelines & recommendations: Target dosing levels will be at or below 60 MME/day. Use of benzodiazepines** is not  recommended.  Exceptions: There are only two exceptions to the rule of not receiving pain medications from other Healthcare Providers. 1.   Exception #1 (Emergencies): In the event of an emergency (i.e.: accident requiring emergency care), you are allowed to receive additional pain medication. However, you are responsible for: As soon as you are able, call our office [phone], at any time of the day or night, and leave a message stating your name, the date and nature of the emergency, and the name and dose of the medication prescribed. In the event that your call is answered by a member of our staff, make sure to document and save the date, time, and the name of the person that took your information.  2. Exception #2 (Planned Surgery): In the event that you are scheduled by another doctor or dentist to have any type of surgery or procedure, you are allowed (for a period no longer than 30 days), to receive additional pain medication, for the acute post-op pain. However, in this case, you are responsible for picking up a copy of our "Post-op Pain Management for Surgeons" handout, and giving it to your surgeon or dentist. This document is available at our office, and does not require an appointment to obtain it. Simply go to our office during business hours (Monday-Thursday from 8:00 AM to 4:00 PM) (Friday 8:00 AM to 12:00 Noon) or if you have a scheduled appointment with [place], prior to your surgery, and ask for it by name. In addition, you will need to provide [place] with your name, name of your surgeon, type of surgery, and date of procedure or surgery.  *Opioid medications include: morphine, codeine, oxycodone, oxymorphone, hydrocodone, hydromorphone, meperidine, tramadol, tapentadol, buprenorphine, fentanyl, methadone. **Benzodiazepine medications include: diazepam (Valium), alprazolam (Xanax), clonazepam (Klonopine), lorazepam (Ativan), clorazepate (Tranxene), chlordiazepoxide (Librium), estazolam (Prosom),  oxazepam (Serax), temazepam (Restoril), triazolam (Halcion) (Last updated: 08/28/2017) ____________________________________________________________________________________________   ____________________________________________________________________________________________  CANNABIDIOL (AKA: CBD Oil or Pills)  Applies to: All patients receiving prescriptions of controlled substances (written and/or electronic).  General Information: Cannabidiol (CBD) was discovered in 37. It is one of some 113 identified cannabinoids in cannabis (Marijuana) plants, accounting for up to 40% of the plant's extract. As of 2018, preliminary clinical research on cannabidiol included studies of anxiety, cognition, movement disorders, and pain.  Cannabidiol is consummed in multiple ways, including inhalation of cannabis smoke or vapor, as an aerosol spray into the cheek, and by mouth. It may be supplied as CBD oil containing CBD as the active ingredient (no added tetrahydrocannabinol (THC) or terpenes), a full-plant CBD-dominant hemp extract oil, capsules, dried cannabis, or as a liquid solution. CBD is thought not have the same psychoactivity as THC, and may affect the actions of THC. Studies suggest that CBD may interact with different biological targets, including cannabinoid receptors and other neurotransmitter receptors. As of 2018 the mechanism of action for its biological effects has not been determined.  In the [place], cannabidiol has a limited approval by the Food and Drug Administration (FDA) for treatment of only two types of epilepsy disorders. The side effects of long-term use of the drug include somnolence, decreased appetite, diarrhea, fatigue, malaise, weakness, sleeping problems, and others.  CBD remains a Schedule I drug prohibited for any use.  Legality: Some manufacturers ship CBD products nationally, an illegal action which the FDA has not enforced in 2018, with CBD remaining the subject of  an FDA investigational new drug evaluation, and is not considered legal as a dietary supplement or food ingredient as of December 2018. Federal illegality has made it difficult historically to conduct research on CBD. CBD is openly sold in  head shops and health food stores in some states where such sales have not been explicitly legalized.  Warning: Because it is not FDA approved for general use or treatment of pain, it is not required to undergo the same manufacturing controls as prescription drugs.  This means that the available cannabidiol (CBD) may be contaminated with THC.  If this is the case, it will trigger a positive urine drug screen (UDS) test for cannabinoids (Marijuana).  Because a positive UDS for illicit substances is a violation of our medication agreement, your opioid analgesics (pain medicine) may be permanently discontinued. (Last update: 09/18/2017) ____________________________________________________________________________________________   BMI Assessment: Estimated body mass index is 27.28 kg/m as calculated from the following:   Height as of this encounter: 5' 0.5" (1.537 m).   Weight as of this encounter: 142 lb (64.4 kg).  BMI interpretation table: BMI level Category Range association with higher incidence of chronic pain  <18 kg/m2 Underweight   18.5-24.9 kg/m2 Ideal body weight   25-29.9 kg/m2 Overweight Increased incidence by 20%  30-34.9 kg/m2 Obese (Class I) Increased incidence by 68%  35-39.9 kg/m2 Severe obesity (Class II) Increased incidence by 136%  >40 kg/m2 Extreme obesity (Class III) Increased incidence by 254%   Patient's current BMI Ideal Body weight  Body mass index is 27.28 kg/m. Ideal body weight: 46.6 kg (102 lb 13.5 oz) Adjusted ideal body weight: 53.8 kg (118 lb 8.1 oz)   BMI Readings from Last 4 Encounters:  06/09/18 27.28 kg/m  05/14/18 28.51 kg/m  03/10/18 27.47 kg/m  03/10/18 27.93 kg/m   Wt Readings from Last 4 Encounters:   06/09/18 142 lb (64.4 kg)  05/14/18 146 lb (66.2 kg)  03/10/18 143 lb (64.9 kg)  03/10/18 143 lb (64.9 kg)

## 2018-06-14 LAB — TOXASSURE SELECT 13 (MW), URINE

## 2018-08-24 ENCOUNTER — Ambulatory Visit: Payer: Medicare Other | Admitting: Nurse Practitioner

## 2018-08-26 DIAGNOSIS — M4317 Spondylolisthesis, lumbosacral region: Secondary | ICD-10-CM | POA: Insufficient documentation

## 2018-08-27 ENCOUNTER — Ambulatory Visit: Payer: Medicare Other | Admitting: Pain Medicine

## 2018-09-02 ENCOUNTER — Other Ambulatory Visit: Payer: Self-pay | Admitting: *Deleted

## 2018-09-02 ENCOUNTER — Other Ambulatory Visit: Payer: Self-pay | Admitting: Neurosurgery

## 2018-09-04 ENCOUNTER — Encounter: Payer: Self-pay | Admitting: Surgery

## 2018-09-08 ENCOUNTER — Telehealth: Payer: Self-pay | Admitting: Surgery

## 2018-09-08 NOTE — Telephone Encounter (Signed)
Called patient advised her of her appt on 09/28/18 at 12:30 pm and to bring ALL films related to this surgery to this appt. Will mail patient paperwork.

## 2018-09-09 ENCOUNTER — Other Ambulatory Visit: Payer: Self-pay

## 2018-09-09 ENCOUNTER — Ambulatory Visit: Payer: Medicare Other | Attending: Nurse Practitioner | Admitting: Nurse Practitioner

## 2018-09-09 ENCOUNTER — Encounter: Payer: Self-pay | Admitting: Nurse Practitioner

## 2018-09-09 VITALS — BP 108/62 | HR 88 | Temp 98.0°F | Resp 16 | Ht 62.5 in | Wt 144.0 lb

## 2018-09-09 DIAGNOSIS — G894 Chronic pain syndrome: Secondary | ICD-10-CM

## 2018-09-09 DIAGNOSIS — M47812 Spondylosis without myelopathy or radiculopathy, cervical region: Secondary | ICD-10-CM

## 2018-09-09 DIAGNOSIS — M792 Neuralgia and neuritis, unspecified: Secondary | ICD-10-CM | POA: Diagnosis not present

## 2018-09-09 DIAGNOSIS — M47816 Spondylosis without myelopathy or radiculopathy, lumbar region: Secondary | ICD-10-CM | POA: Diagnosis not present

## 2018-09-09 DIAGNOSIS — M7918 Myalgia, other site: Secondary | ICD-10-CM | POA: Diagnosis not present

## 2018-09-09 MED ORDER — GABAPENTIN 300 MG PO CAPS: 300.0000 mg | ORAL_CAPSULE | Freq: Two times a day (BID) | ORAL | 0 refills | Status: DC

## 2018-09-09 MED ORDER — OXYCODONE-ACETAMINOPHEN 7.5-325 MG PO TABS: 1.0000 | ORAL_TABLET | Freq: Every day | ORAL | 0 refills | Status: DC | PRN

## 2018-09-09 MED ORDER — CYCLOBENZAPRINE HCL 10 MG PO TABS: 10.0000 mg | ORAL_TABLET | Freq: Three times a day (TID) | ORAL | 0 refills | Status: DC | PRN

## 2018-09-09 NOTE — Progress Notes (Signed)
Patient's Name: Jacqueline Thornton  MRN: [id number]  Referring Provider: Perrin Maltese, MD  DOB: 1950/10/09  PCP: Perrin Maltese, MD  DOS: 09/09/2018  Note by: Dionisio David, NP  Service setting: Ambulatory outpatient  Specialty: Interventional Pain Management  Location: [facility] (AMB) Pain Management Facility    Patient type: Established   HPI  Reason for Visit: [name] is a 68 y.o. year old, female patient, who comes today with a chief complaint of Back Pain (lumbar left is worse ) Last Appointment: Her last appointment at our practice was on 08/24/2018. I last saw her on 08/24/2018.  Pain Assessment: Today, [name] describes the severity of the Chronic pain as a 4 /10. She indicates the location/referral of the pain to be Back Lower, Left, Right/into the buttocks bilaterally and left is the worst . Onset was: More than a month ago. The quality of pain is described as Burning, Aching, Tender, Sore, Discomfort, Constant. Temporal description, or timing of pain is: Constant. Possible modifying factors: pain medications help take the edge off. takes ibuprofen along with pain medication. Jacqueline Thornton's  height is 5' 2.5" (1.588 m) and weight is 144 lb (65.3 kg). Her oral temperature is 98 F (36.7 C). Her blood pressure is 108/62 and her pulse is 88. Her respiration is 16 and oxygen saturation is 98%.  She is having increased pain in her left back . She feels like there is a burning pain. She is scheduled to have have back surgery with Dr Jacqueline Thornton. She admits that he did her other neck surgeries.  Controlled Substance Pharmacotherapy Assessment REMS (Risk Evaluation and Mitigation Strategy)  Analgesic:Oxycodone 7.5/325 one tablet 5 times a day. (56.25 mg/day) MME/day:56.25 mg/day Janett Billow, RN  09/09/2018  1:32 PM  Sign when Signing Visit Nursing Pain Medication Assessment:  Safety precautions to be maintained throughout the outpatient stay will include: orient to surroundings, keep  bed in low position, maintain call bell within reach at all times, provide assistance with transfer out of bed and ambulation.  Medication Inspection Compliance: Pill count conducted under aseptic conditions, in front of the patient. Neither the pills nor the bottle was removed from the patient's sight at any time. Once count was completed pills were immediately returned to the patient in their original bottle.  Medication: Oxycodone/APAP Pill/Patch Count: 56 of 150 pills remain Pill/Patch Appearance: Markings consistent with prescribed medication Bottle Appearance: Standard pharmacy container. Clearly labeled. Filled Date: 02 / 23 / 2020 Last Medication intake:  Today   Pharmacokinetics: Liberation and absorption (onset of action): WNL Distribution (time to peak effect): WNL Metabolism and excretion (duration of action): WNL         Pharmacodynamics: Desired effects: Analgesia: [name] reports >50% benefit. Functional ability: Patient reports that medication allows her to accomplish basic ADLs Clinically meaningful improvement in function (CMIF): Sustained CMIF goals met Perceived effectiveness: Described as relatively effective, allowing for increase in activities of daily living (ADL) Undesirable effects: Side-effects or Adverse reactions: None reported Monitoring: [place] PMP: Online review of the past 46-monthperiod conducted. Compliant with practice rules and regulations Last UDS on record: Summary  Date Value Ref Range Status  06/09/2018 FINAL  Final    Comment:    ==================================================================== TOXASSURE SSELECT 65(MW) ==================================================================== Test                             Result  Flag       Units Drug Present and Declared for Prescription Verification   Oxycodone                      3546         EXPECTED   ng/mg creat   Oxymorphone                    1252         EXPECTED   ng/mg  creat   Noroxycodone                   5124         EXPECTED   ng/mg creat   Noroxymorphone                 405          EXPECTED   ng/mg creat    Sources of oxycodone are scheduled prescription medications.    Oxymorphone, noroxycodone, and noroxymorphone are expected    metabolites of oxycodone. Oxymorphone is also available as a    scheduled prescription medication. ==================================================================== Test                      Result    Flag   Units      Ref Range   Creatinine              85               mg/dL      >=20 ==================================================================== Declared Medications:  The flagging and interpretation on this report are based on the  following declared medications.  Unexpected results may arise from  inaccuracies in the declared medications.  **Note: The testing scope of this panel includes these medications:  Oxycodone  Oxycodone (Oxycodone Acetaminophen)  **Note: The testing scope of this panel does not include following  reported medications:  Acetaminophen (Oxycodone Acetaminophen)  Cholecalciferol  Cyanocobalamin  Cyclobenzaprine  Duloxetine  Enalapril  Esomeprazole  Gabapentin  Ibuprofen (Advil)  Melatonin  Rosuvastatin  Topical ==================================================================== For clinical consultation, please call [phone]. ====================================================================    UDS interpretation: Compliant          Medication Assessment Form: Reviewed. Patient indicates being compliant with therapy Treatment compliance: Compliant Risk Assessment Profile: Aberrant behavior: See initial evaluations. None observed or detected today Comorbid factors increasing risk of overdose: See initial evaluation. No additional risks detected today Opioid risk tool (ORT):  Opioid Risk  09/09/2018  Alcohol 0  Illegal Drugs 0  Rx Drugs 0  Alcohol 0  Illegal  Drugs 0  Rx Drugs 0  Age between 16-45 years  0  History of Preadolescent Sexual Abuse -  Psychological Disease 2  ADD Negative  OCD Negative  Bipolar Negative  Depression 1  Opioid Risk Tool Scoring 3  Opioid Risk Interpretation Low Risk    ORT Scoring interpretation table:  Score <3 = Low Risk for SUD  Score between 4-7 = Moderate Risk for SUD  Score >8 = High Risk for Opioid Abuse   Risk of substance use disorder (SUD): Low  Risk Mitigation Strategies:  Patient Counseling: Covered Patient-Prescriber Agreement (PPA): Present and active  Notification to other healthcare providers: Done  Pharmacologic Plan: No change in therapy, at this time.             ROS  Constitutional: Denies any fever or chills Gastrointestinal: No reported hemesis, hematochezia, vomiting, or  acute GI distress Musculoskeletal: Denies any acute onset joint swelling, redness, loss of ROM, or weakness Neurological: No reported episodes of acute onset apraxia, aphasia, dysarthria, agnosia, amnesia, paralysis, loss of coordination, or loss of consciousness  Medication Review  DULoxetine, Melatonin, Vitamin B-12, Vitamin D3, amoxicillin-clavulanate, busPIRone, cyclobenzaprine, enalapril, esomeprazole, estradiol, gabapentin, ibuprofen, oxyCODONE, oxyCODONE-acetaminophen, and rosuvastatin  History Review  Allergy: [name] has No Known Allergies. Drug: [name]  reports no history of drug use. Alcohol:  reports no history of alcohol use. Tobacco:  reports that she quit smoking about 15 years ago. She has never used smokeless tobacco. Social: [name]  reports that she quit smoking about 15 years ago. She has never used smokeless tobacco. She reports that she does not drink alcohol or use drugs. 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 Surgical: [name]  has a past surgical history that includes Cholecystectomy; Abdominal hysterectomy; Neck surgery; Shoulder surgery; Colonoscopy with propofol (N/A, 07/19/2015); caract surger; Laparoscopic salpingo oophorectomy (Bilateral, 03/03/2018); Cystoscopy (03/03/2018); Tonsillectomy; Appendectomy; and Tumor removal. Family: family history includes Colon cancer in her maternal grandmother and mother; Diabetes in her father and mother; Hypertension in her sister; Stroke in her mother. Problem List: [name] has Chronic pain syndrome; Chronic low back pain (Secondary source of pain) (Bilateral) (midline to tailbone) (R>L); Spondylosis of lumbar spine; Lumbar annular disc tear (L4-5); Discogenic low back pain (L3-4 and L4-5); Lumbar facet hypertrophy; Lumbar facet syndrome (Bilateral) (R>L); Chronic neck pain; Cervical spondylosis; Cervical spinal fusion (C6-7 interbody fusion); Chronic sacroiliac joint pain (Bilateral) (L>R); Chronic lumbar radicular pain (Primary Source of Pain) (Left) (S1 Dermatome); Trochanteric bursitis of right hip; Chronic hip pain (Bilateral) (L>R); Neurogenic pain; Myofascial pain; Osteoarthritis of hip (Left); Trochanteric bursitis of hip (Bilateral) (L>R); and Bone island of right femur on their pertinent problem list.  Lab Review  Kidney Function Lab Results  Component Value Date   BUN 15 03/08/2018   CREATININE 0.97 03/08/2018   BCR 9 (L) 12/05/2016   GFRAA >60 03/08/2018   GFRNONAA 60 (L) 03/08/2018  Liver Function Lab  Results  Component Value Date   AST 14 (L) 03/08/2018   ALT 14 03/08/2018   ALBUMIN 3.3 (L) 03/08/2018  Note: Above Lab results reviewed.  Imaging Review  DG C-Arm 1-60 Min-No Report Fluoroscopy was utilized by the requesting physician.  No radiographic  interpretation.  Note: Reviewed        Physical Exam  General appearance: Well nourished, well developed, and well hydrated. In no apparent acute distress Mental status: Alert, oriented x 3 (person, place, & time)       Respiratory: No evidence of acute respiratory distress Eyes: PERLA Vitals: BP 108/62 (BP Location: Left Arm, Patient Position: Sitting, Cuff Size: Normal)   Pulse 88   Temp 98 F (36.7 C) (Oral)   Resp 16   Ht 5' 2.5" (1.588 m)   Wt 144 lb (65.3 kg)   SpO2 98%   BMI 25.92 kg/m  BMI: Estimated body mass index is 25.92 kg/m as calculated from the following:   Height as  of this encounter: 5' 2.5" (1.588 m).   Weight as of this encounter: 144 lb (65.3 kg). Ideal: Ideal body weight: 51.3 kg (112 lb 15.8 oz) Adjusted ideal body weight: 56.9 kg (125 lb 6.3 oz) Lumbar Spine Area Exam  Skin & Axial Inspection: No masses, redness, or swelling Alignment: Symmetrical Functional ROM: Unrestricted ROM       Stability: No instability detected Muscle Tone/Strength: Functionally intact. No obvious neuro-muscular anomalies detected. Sensory (Neurological): Unimpaired Palpation: Complains of area being tender to palpation       Provocative Tests: Hyperextension/rotation test: Positive bilaterally for facet joint pain. Lumbar quadrant test (Kemp's test): deferred today       Lateral bending test: deferred today       Patrick's Maneuver: deferred today                    Gait & Posture Assessment  Ambulation: Unassisted Gait: Relatively normal for age and body habitus Posture: WNL  Lower Extremity Exam    Side: Right lower extremity  Side: Left lower extremity  Stability: No instability observed          Stability: No  instability observed          Skin & Extremity Inspection: Skin color, temperature, and hair growth are WNL. No peripheral edema or cyanosis. No masses, redness, swelling, asymmetry, or associated skin lesions. No contractures.  Skin & Extremity Inspection: Skin color, temperature, and hair growth are WNL. No peripheral edema or cyanosis. No masses, redness, swelling, asymmetry, or associated skin lesions. No contractures.  Functional ROM: Unrestricted ROM                  Functional ROM: Unrestricted ROM                  Muscle Tone/Strength: Functionally intact. No obvious neuro-muscular anomalies detected.  Muscle Tone/Strength: TEFL teacher (Neurological): Referred pain pattern        Sensory (Neurological): Neuropathic pain pattern        DTR: Patellar: deferred today Achilles: deferred today Plantar: deferred today  DTR: Patellar: deferred today Achilles: deferred today Plantar: deferred today  Palpation: No palpable anomalies  Palpation: No palpable anomalies   Assessment   Status Diagnosis  Controlled Controlled Controlled 1. Spondylosis of lumbar spine   2. Cervical spondylosis   3. Myofascial pain   4. Neurogenic pain   5. Chronic pain syndrome      Updated Problems: No problems updated.  Plan of Care  Pharmacotherapy (Medications Ordered): Meds ordered this encounter  Medications  . cyclobenzaprine (FLEXERIL) 10 MG tablet    Sig: Take 1 tablet (10 mg total) by mouth 3 (three) times daily as needed for muscle spasms.    Dispense:  270 tablet    Refill:  0    Do not place this medication, or any other prescription from our practice, on "Automatic Refill". Patient may have prescription filled one day early if pharmacy is closed on scheduled refill date.    Order Specific Question:   Supervising Provider    Answer:   Milinda Pointer [phone]  . gabapentin (NEURONTIN) 300 MG capsule    Sig: Take 1 capsule (300 mg total) by mouth 2 (two) times daily.     Dispense:  180 capsule    Refill:  0    Do not add this medication to the electronic "Automatic Refill" notification system. Patient may have prescription filled one day early if pharmacy is closed  on scheduled refill date.    Order Specific Question:   Supervising Provider    Answer:   Milinda Pointer [phone]  . oxyCODONE-acetaminophen (PERCOCET) 7.5-325 MG tablet    Sig: Take 1 tablet by mouth 5 (five) times daily as needed for severe pain.    Dispense:  150 tablet    Refill:  0    Do not place this medication, or any other prescription from our practice, on "Automatic Refill". Patient may have prescription filled one day early if pharmacy is closed on scheduled refill date.    Order Specific Question:   Supervising Provider    Answer:   Milinda Pointer [phone]  . oxyCODONE-acetaminophen (PERCOCET) 7.5-325 MG tablet    Sig: Take 1 tablet by mouth 5 (five) times daily as needed for up to 30 days for severe pain.    Dispense:  150 tablet    Refill:  0    Do not place this medication, or any other prescription from our practice, on "Automatic Refill". Patient may have prescription filled one day early if pharmacy is closed on scheduled refill date.    Order Specific Question:   Supervising Provider    Answer:   Milinda Pointer [phone]  . oxyCODONE-acetaminophen (PERCOCET) 7.5-325 MG tablet    Sig: Take 1 tablet by mouth 5 (five) times daily as needed for up to 30 days for severe pain.    Dispense:  150 tablet    Refill:  0    Do not place this medication, or any other prescription from our practice, on "Automatic Refill". Patient may have prescription filled one day early if pharmacy is closed on scheduled refill date.    Order Specific Question:   Supervising Provider    Answer:   Milinda Pointer [phone]   Administered today: Cindra Eves. Nazario had no medications administered during this visit.  Orders:  No orders of the defined types were placed in this  encounter.  Follow-up plan:   Return in about 3 months (around 12/10/2018) for MedMgmt. Not at this time   Interventional options: Considering:  Diagnostic caudal epidural steroid injection + diagnostic epidurogram Palliative Right sided lumbar facet radiofrequency ablation + Left S-I Block Diagnostic cervical epidural steroid injection Diagnostic bilateral cervical facet block Possible bilateral cervical facet radiofrequencyablation  Diagnostic bilateral lumbar facet block Possible bilateral lumbar facet radiofrequencyablation  Diagnostic left L4-5 lumbar epidural steroid injection  Diagnostic left S1 selective nerve root block Diagnostic bilateral sacroiliac joint block Possible bilateral sacroiliac joint radiofrequencyablation  Diagnostic right trochanteric bursa injection   Palliative PRN treatment(s):  Palliativecervical epidural steroid injection  Palliativebilateral cervical facet block Palliativebilateral lumbar facet block Palliativeleft L4-5 lumbar epiduralsteroid injection  Palliativeleft caudal epidural steroid injection  Palliativeleft S1 selective nerve root block Palliativebilateral sacroiliac joint block Palliativeright trochanteric bursa injec    Note by: Dionisio David, NP Date: 09/09/2018; Time: 3:29 PM

## 2018-09-09 NOTE — Patient Instructions (Addendum)
____________________________________________________________________________________________  Medication Rules  Purpose: To inform patients, and their family members, of our rules and regulations.  Applies to: All patients receiving prescriptions (written or electronic).  Pharmacy of record: Pharmacy where electronic prescriptions will be sent. If written prescriptions are taken to a different pharmacy, please inform the nursing staff. The pharmacy listed in the electronic medical record should be the one where you would like electronic prescriptions to be sent.  Electronic prescriptions: In compliance with the [place] Strengthen Opioid Misuse Prevention (STOP) Act of 2017 (Session Law 2017-74/H243), effective July 01, 2018, all controlled substances must be electronically prescribed. Calling prescriptions to the pharmacy will cease to exist.  Prescription refills: Only during scheduled appointments. Applies to all prescriptions.  NOTE: The following applies primarily to controlled substances (Opioid* Pain Medications).   Patient's responsibilities: 1. Pain Pills: Bring all pain pills to every appointment (except for procedure appointments). 2. Pill Bottles: Bring pills in original pharmacy bottle. Always bring the newest bottle. Bring bottle, even if empty. 3. Medication refills: You are responsible for knowing and keeping track of what medications you take and those you need refilled. The day before your appointment: write a list of all prescriptions that need to be refilled. The day of the appointment: give the list to the admitting nurse. Prescriptions will be written only during appointments. No prescriptions will be written on procedure days. If you forget a medication: it will not be "Called in", "Faxed", or "electronically sent". You will need to get another appointment to get these prescribed. No early refills. Do not call asking to have your prescription filled  early. 4. Prescription Accuracy: You are responsible for carefully inspecting your prescriptions before leaving our office. Have the discharge nurse carefully go over each prescription with you, before taking them home. Make sure that your name is accurately spelled, that your address is correct. Check the name and dose of your medication to make sure it is accurate. Check the number of pills, and the written instructions to make sure they are clear and accurate. Make sure that you are given enough medication to last until your next medication refill appointment. 5. Taking Medication: Take medication as prescribed. When it comes to controlled substances, taking less pills or less frequently than prescribed is permitted and encouraged. Never take more pills than instructed. Never take medication more frequently than prescribed.  6. Inform other Doctors: Always inform, all of your healthcare providers, of all the medications you take. 7. Pain Medication from other Providers: You are not allowed to accept any additional pain medication from any other Doctor or Healthcare provider. There are two exceptions to this rule. (see below) In the event that you require additional pain medication, you are responsible for notifying us, as stated below. 8. Medication Agreement: You are responsible for carefully reading and following our Medication Agreement. This must be signed before receiving any prescriptions from our practice. Safely store a copy of your signed Agreement. Violations to the Agreement will result in no further prescriptions. (Additional copies of our Medication Agreement are available upon request.) 9. Laws, Rules, & Regulations: All patients are expected to follow all Federal and State Laws, Statutes, Rules, & Regulations. Ignorance of the Laws does not constitute a valid excuse. The use of any illegal substances is prohibited. 10. Adopted CDC guidelines & recommendations: Target dosing levels will be  at or below 60 MME/day. Use of benzodiazepines** is not recommended.  Exceptions: There are only two exceptions to the rule of not   receiving pain medications from other Healthcare Providers. 1. Exception #1 (Emergencies): In the event of an emergency (i.e.: accident requiring emergency care), you are allowed to receive additional pain medication. However, you are responsible for: As soon as you are able, call our office [phone], at any time of the day or night, and leave a message stating your name, the date and nature of the emergency, and the name and dose of the medication prescribed. In the event that your call is answered by a member of our staff, make sure to document and save the date, time, and the name of the person that took your information.  2. Exception #2 (Planned Surgery): In the event that you are scheduled by another doctor or dentist to have any type of surgery or procedure, you are allowed (for a period no longer than 30 days), to receive additional pain medication, for the acute post-op pain. However, in this case, you are responsible for picking up a copy of our "Post-op Pain Management for Surgeons" handout, and giving it to your surgeon or dentist. This document is available at our office, and does not require an appointment to obtain it. Simply go to our office during business hours (Monday-Thursday from 8:00 AM to 4:00 PM) (Friday 8:00 AM to 12:00 Noon) or if you have a scheduled appointment with us, prior to your surgery, and ask for it by name. In addition, you will need to provide us with your name, name of your surgeon, type of surgery, and date of procedure or surgery.  *Opioid medications include: morphine, codeine, oxycodone, oxymorphone, hydrocodone, hydromorphone, meperidine, tramadol, tapentadol, buprenorphine, fentanyl, methadone. **Benzodiazepine medications include: diazepam (Valium), alprazolam (Xanax), clonazepam (Klonopine), lorazepam (Ativan), clorazepate  (Tranxene), chlordiazepoxide (Librium), estazolam (Prosom), oxazepam (Serax), temazepam (Restoril), triazolam (Halcion) (Last updated: 08/28/2017) ____________________________________________________________________________________________    

## 2018-09-09 NOTE — Progress Notes (Signed)
Nursing Pain Medication Assessment:  Safety precautions to be maintained throughout the outpatient stay will include: orient to surroundings, keep bed in low position, maintain call bell within reach at all times, provide assistance with transfer out of bed and ambulation.  Medication Inspection Compliance: Pill count conducted under aseptic conditions, in front of the patient. Neither the pills nor the bottle was removed from the patient's sight at any time. Once count was completed pills were immediately returned to the patient in their original bottle.  Medication: Oxycodone/APAP Pill/Patch Count: 56 of 150 pills remain Pill/Patch Appearance: Markings consistent with prescribed medication Bottle Appearance: Standard pharmacy container. Clearly labeled. Filled Date: 02 / 23 / 2020 Last Medication intake:  Today

## 2018-09-23 ENCOUNTER — Other Ambulatory Visit: Payer: Self-pay | Admitting: *Deleted

## 2018-09-28 ENCOUNTER — Ambulatory Visit: Payer: Medicare Other | Admitting: Surgery

## 2018-10-16 ENCOUNTER — Inpatient Hospital Stay: Admit: 2018-10-16 | Payer: Medicare Other | Admitting: Neurosurgery

## 2018-10-16 SURGERY — ANTERIOR LUMBAR FUSION 1 LEVEL
Anesthesia: General

## 2018-11-09 ENCOUNTER — Telehealth: Payer: Self-pay | Admitting: Pain Medicine

## 2018-11-09 NOTE — Telephone Encounter (Signed)
Pt called and stated that at her last visit with Crystal she only gave her 2 months supply of her medication and she made a 3 month appt. She wants to know if she was supposed to make a 2 month appt or if Crystal accidentally didn't send in enough medication.

## 2018-11-09 NOTE — Telephone Encounter (Signed)
Called to let patient know that there were 3 rx's sent in for her one for march, April and May which would last until 12/23/18.  Patient verbalizes u/o information.

## 2018-11-11 ENCOUNTER — Other Ambulatory Visit: Payer: Self-pay | Admitting: *Deleted

## 2018-11-11 ENCOUNTER — Other Ambulatory Visit: Payer: Self-pay | Admitting: Neurosurgery

## 2018-11-16 ENCOUNTER — Ambulatory Visit: Payer: Medicare Other | Admitting: Surgery

## 2018-11-30 ENCOUNTER — Ambulatory Visit: Payer: Medicare Other | Admitting: Surgery

## 2018-12-01 ENCOUNTER — Encounter: Payer: Self-pay | Admitting: Family

## 2018-12-04 NOTE — H&P (Signed)
Patient ID:   [id number] Patient: Jacqueline Thornton  Date of Birth: 1950-12-24 Visit Type: Office Visit   Date: 08/26/2018 11:15 AM Provider: Marchia Meiers. Vertell Limber MD   This 68 year old female presents for back and neck pain.  HISTORY OF PRESENT ILLNESS:  1.  back and neck pain  Patient returns to review her MRI. She reports unchanged low back, bilateral buttocks, and bilateral left greater than right leg pain. Patient notes that previous injections provided relief of symptoms for approximately 3 months.  08/18/18 L-spine MRI without contrast 1. Stable from 12/26/17 2. 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 There is right articular recess effacement at this level without definite right S1 compression.         Medical/Surgical/Interim History Reviewed, no change.  Last detailed document date:08/05/2018.     Family History:  Reviewed, no changes.  Last detailed document date:08/05/2018.   Social History: Reviewed, no changes. Last detailed document date: 08/05/2018.    MEDICATIONS: (added, continued or stopped this visit) Started Medication Directions Instruction Stopped   buspirone 10 mg tablet take 1 tablet by oral route 2 times every day     Cymbalta 60 mg capsule,delayed release take 1 capsule by oral route  every day     ESTROGEN  ORAL      gabapentin 300 mg capsule take 1 capsule by oral route 3 times every day     methocarbamol 750 mg tablet take 1 tablet by oral route  every 8 hours     oxycodone-acetaminophen 5 mg-325 mg tablet take 1 tablet by oral route 5 times daily as directed       ALLERGIES: Ingredient Reaction Medication Name Comment  NO KNOWN ALLERGIES     No known allergies.    PHYSICAL EXAM:   Vitals Date Temp F BP Pulse Ht In Wt Lb BMI BSA Pain Score  08/26/2018  132/93 106 60.5 140 26.89  7/10      IMPRESSION:   L-spine MRI without contrast reveals focal disc  degeneration at L5-S1 with a left eccentric herniation near but not deforming the S1 nerve root. Schmorl's node also noted. These findings are stable compared to 12/26/17. Discussed injections - patient previously noted significant relief with injections. However, patient notes that she has been receiving injections for years and does not wish to receive anymore. Patient wished to discuss surgical intervention. Patient notes that she has osteoporosis per previous bone densitometry (however, she notes that this was finding was from her wrist). Requesting record of previous bone densitometry. Recommended surgical intervention - L5-S1 ALIF recommended. Informed patient that a vascular surgeon would be present. Patient will follow-up after surgery.  PLAN:  1. Nurse education given 2. LSO brace fitted 3. L5-S1 ALIF to be scheduled 4. Follow-up after surgery  Orders: Diagnostic Procedures: Assessment Procedure  M54.16 Lumbar Spine- AP/Lat  Miscellaneous: Assessment   M43.17 [place] Lo Sag Rigid Panel Quick   Assessment/Plan   # Detail Type Description   1. Assessment Radiculopathy, lumbar region (M54.16).       2. Assessment Spondylolisthesis of lumbosacral region (M43.17).   Plan Orders Aspen Lo Sag Rigid Panel Quick.                     Provider:  Marchia Meiers. Vertell Limber MD  08/27/2018 04:12 PM Dictation edited by: Mirian Mo    CC Providers: [place]  [facility] [address],  [place]  [id number]-   Robert Sypher  [facility] [address] [place], [place] [address]-               Electronically signed by Marchia Meiers Vertell Limber MD on 08/29/2018 12:45 PM  Patient ID:   [id number] Patient: Jacqueline Thornton  Date of Birth: January 25, 1951 Visit Type: Office Visit   Date: 08/05/2018 01:30 PM Provider: Marchia Meiers. Vertell Limber MD   This 68 year old female presents for lower back and neck pain.  HISTORY OF  PRESENT ILLNESS:  1.  lower back and neck pain  Patient returns reporting low back, bilateral buttock, and left greater than right leg pain over the last 2 months.  She notes significant increase in symptoms over the last 2 weeks, with standing greater than 10 minutes.   Epidural injections late 2018 offered little lasting relief  MRI 12/26/2017 on [facility]  Percocet 5/325 q.i.d. Gabapentin ?mg b.i.d. Robaxin 500 mg t.i.d.  X-ray on [facility]  The patient describes that her pain is very severe and that it is twice as bad as it was before.  She says the pain is in the same location.  She has left sciatic notch discomfort which radiates into her left foot.  She appears to have full strength on confrontational testing of both lower extremities and reflexes are symmetric.  She has positive seated straight leg raise on the left.  She also has some bursitis over her left hip.  She had epidural injections in 2018 which she said did not give her any significant lasting relief.  Lumbar radiographs show normal appearing hips with decreased disc height at the L5-S1 level.          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  Family History:  (Detailed) Relationship Family Member Name Deceased Age at Death Condition Onset Age Cause of Death  Father    Diabetes mellitus  N  Mother  Y  Myocardial  infarction  Y  Mother  Y  Cancer, colon  Y  Mother  Y      Mother  Y  Stroke  Y     Social History:  (Detailed) Tobacco use reviewed. Preferred language is Unknown.   Tobacco use status: Ex-smoker. Smoking status: Former smoker.  SMOKING STATUS Type Smoking Status Usage Per Day Years Used Total Pack Years  Cigarette Former smoker      Former smoker      CESSATION Type Date Quit Longest Tobacco Free Cessation Method Relapse Reason  Cigarette 07/02/2003 15 Years Unassisted    TOBACCO/VAPING EXPOSURE No passive vaping exposure. No passive smoke exposure.   HOME ENVIRONMENT/SAFETY The patient is at risk for falls. The patient has fallen 1 times in the last year.  The fall(s) resulted  in injury. Details: Slipped and fell landed on the R side.     MEDICATIONS: (added, continued or stopped this visit) Started Medication Directions Instruction Stopped   buspirone 10 mg tablet take 1 tablet by oral route 2 times every day     Cymbalta 60 mg capsule,delayed release take 1 capsule by oral route  every day     ESTROGEN  ORAL      gabapentin 300 mg capsule take 1 capsule by oral route 3 times every day     methocarbamol 750 mg tablet take 1 tablet by oral route  every 8 hours     oxycodone-acetaminophen 5 mg-325 mg tablet take 1 tablet by oral route 5 times daily as directed       ALLERGIES: Ingredient Reaction Medication Name Comment  NO KNOWN ALLERGIES     No known allergies.    PHYSICAL EXAM:   Vitals Date Temp F BP Pulse Ht In Wt Lb BMI BSA Pain Score  08/05/2018  122/77 87 60.5 145 27.85  5/10      IMPRESSION:   In light of the patient's worsening and significant back and left greater than right lower extremity pain, I have recommended proceeding with repeat imaging of lumbar spine which will consist of lumbar MRI without gadolinium.  The patient will come back to see me after that has been performed.  PLAN:  Follow-up after lumbar  imaging.  Orders: Diagnostic Procedures: Assessment Procedure  M54.16 Lumbar Spine- AP/Lat/Flex/Ex  M54.16 MRI Spine/lumb W/o Contrast  Instruction(s)/Education: Assessment Instruction  I10 Hypertension education  Z68.27 Lifestyle education regarding diet   Completed Orders (this encounter) Order Details Reason Side Interpretation Result Initial Treatment Date Region  Lumbar Spine- AP/Lat/Flex/Ex      08/05/2018 All Levels to All Levels   Assessment/Plan   # Detail Type Description   1. Assessment Low back pain, unspecified back pain laterality, with sciatica presence unspecified (M54.5).       2. Assessment DDD (degenerative disc disease), lumbosacral (M51.37).       3. 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       4. Assessment Lumbar radiculopathy (M54.16).       5. Assessment Essential (primary) hypertension (I10).       6. Assessment Body mass index (BMI) 27.0-27.9, adult (D74.12).   Plan Orders Today's instructions / counseling include(s) Lifestyle education regarding diet. Clinical information/comments: Patient encouraged to eat a well balance diet.         Pain Management Plan Pain Scale: 5/10. Method: Numeric Pain Intensity Scale. Location: back and neck. Onset: 05/27/2018. Duration: varies. Quality: discomforting. Pain management follow-up plan of care: Patient taking medication as prescribed.  Fall Risk Plan The patient has fallen 1 times in the last year. The fall(s) resulted in injury. Details: Slipped and fell landed on the R side. Falls risk follow-up plan of care: Assisted devices: Patient taking medication as prescribed.              Provider:  Marchia Meiers. Vertell Limber MD  08/08/2018 04:00 PM Dictation edited by: Marchia Meiers. Vertell Limber    CC Providers: [facility] [address] [place], [place] [address]-               Electronically signed by Marchia Meiers  Vertell Limber MD on 08/08/2018 04:00 PM

## 2018-12-07 ENCOUNTER — Other Ambulatory Visit: Payer: Self-pay

## 2018-12-07 ENCOUNTER — Encounter: Payer: Self-pay | Admitting: Surgery

## 2018-12-07 ENCOUNTER — Other Ambulatory Visit (HOSPITAL_COMMUNITY)
Admission: RE | Admit: 2018-12-07 | Discharge: 2018-12-07 | Disposition: A | Discharge status: Home / Self Care | Payer: Medicare Other | Source: Ambulatory Visit | Attending: Neurosurgery | Admitting: Neurosurgery

## 2018-12-07 ENCOUNTER — Ambulatory Visit (INDEPENDENT_AMBULATORY_CARE_PROVIDER_SITE_OTHER): Payer: Medicare Other | Admitting: Surgery

## 2018-12-07 VITALS — BP 94/62 | HR 92 | Temp 99.1°F | Ht 60.5 in | Wt 147.8 lb

## 2018-12-07 DIAGNOSIS — Z1159 Encounter for screening for other viral diseases: Secondary | ICD-10-CM | POA: Insufficient documentation

## 2018-12-07 DIAGNOSIS — M479 Spondylosis, unspecified: Secondary | ICD-10-CM | POA: Diagnosis not present

## 2018-12-07 NOTE — H&P (View-Only) (Signed)
[facility]  Patient name: Jacqueline Thornton MRN: [id number] DOB: May 02, 1951 Sex: female   REQUESTING PROVIDER:    Dr. Vertell Limber   REASON FOR CONSULT:    Anterior exposure  HISTORY OF PRESENT ILLNESS:   Jacqueline Thornton is a 68 y.o. female, who is referred today for discussions regarding anterior exposure for L5-S1 instrumentation.  Dr. Vertell Limber has evaluated the patient and feels this is the best approach to address her degenerative back disease.  She has had multiple abdominal surgeries including an open cholecystectomy as well as bladder tacking and hysterectomy.  She is a former smoker.  PAST MEDICAL HISTORY    Past Medical History:  Diagnosis Date  . Absolute anemia 04/11/2015  . Acute postoperative pain 08/07/2017  . Angina pectoris ([place])   . Anxiety   . Arthritis   . 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 Atypical face pain 04/24/2015  . Back pain   . CAD (coronary artery disease)   . Chronic back pain   . Depression   . Fibromyalgia   . GERD (gastroesophageal reflux disease)   . 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) 04/11/2015  . H/O: hysterectomy 1979  . Heart murmur   . Hyperlipidemia   . Hypertension   . Low back pain 04/06/2015  . Lumbar radicular pain 04/18/2015  . Migraine   . Narrowing of intervertebral disc space 04/11/2015  . Sacroiliac joint pain 04/11/2015  . Spine disorder      FAMILY HISTORY   Family History  Problem Relation Age of Onset  . Diabetes Father   . Hypertension Sister   . Diabetes Mother   . Stroke Mother   . Colon cancer Mother   . Colon cancer Maternal Grandmother   . Ovarian cancer Neg Hx   . Breast cancer Neg Hx     SOCIAL HISTORY:   Social History   Socioeconomic History  . Marital status: Married    Spouse name: Not on file  . Number of children: Not on file  . Years of education: Not on file  . Highest education level: Not on file   Occupational History  . Not on file  Social Needs  . Financial resource strain: Not hard at all  . Food insecurity:    Worry: Patient refused    Inability: Patient refused  . Transportation needs:    Medical: Patient refused    Non-medical: Patient refused  Tobacco Use  . Smoking status: Former Smoker    Last attempt to quit: 07/02/2003    Years since quitting: 15.4  . Smokeless tobacco: Never Used  Substance and Sexual Activity  . Alcohol use: No    Alcohol/week: 0.0 standard drinks  . Drug use: No  . Sexual activity: Not Currently  Lifestyle  . Physical activity:    Days per week: Patient refused    Minutes per session: Patient refused  . Stress: Not at all  Relationships  . Social connections:    Talks on phone: Patient refused    Gets together: Patient refused    Attends religious service: Patient refused    Active member of club or organization: Patient refused    Attends meetings of clubs or organizations: Patient refused    Relationship status: Patient refused  . Intimate partner violence:    Fear of current or ex partner: Patient refused    Emotionally abused: Patient refused    Physically abused: Patient refused    Forced sexual activity:  Patient refused  Other Topics Concern  . Not on file  Social History Narrative  . Not on file    ALLERGIES:    No Known Allergies  CURRENT MEDICATIONS:    Current Outpatient Medications  Medication Sig Dispense Refill  . busPIRone (BUSPAR) 7.5 MG tablet Take 7.5 mg by mouth 2 (two) times daily.    . Cholecalciferol (VITAMIN D3) 50000 units CAPS Take 50,000 Units by mouth every Wednesday.   3  . Cyanocobalamin (VITAMIN B-12) 5000 MCG SUBL Place 1 tablet (5,000 mcg total) under the tongue daily. 180 tablet 1  . cyclobenzaprine (FLEXERIL) 10 MG tablet Take 1 tablet (10 mg total) by mouth 3 (three) times daily as needed for muscle spasms. 270 tablet 0  . DULoxetine (CYMBALTA) 60 MG capsule Take 60 mg by mouth at bedtime.      . enalapril (VASOTEC) 5 MG tablet Take 5 mg by mouth daily.    Marland Kitchen esomeprazole (NEXIUM) 20 MG capsule Take 20 mg by mouth daily at 12 noon.    Marland Kitchen estradiol (ESTRACE) 0.5 MG tablet Take 0.5 mg by mouth daily.    Marland Kitchen gabapentin (NEURONTIN) 300 MG capsule Take 1 capsule (300 mg total) by mouth 2 (two) times daily. 180 capsule 0  . ibuprofen (ADVIL,MOTRIN) 600 MG tablet Take 1 tablet (600 mg total) by mouth every 6 (six) hours as needed for mild pain or cramping. 30 tablet 0  . Melatonin 3 MG TABS Take 3 mg by mouth at bedtime as needed (for sleep).     Marland Kitchen oxyCODONE (ROXICODONE) 5 MG immediate release tablet Take 1 tablet (5 mg total) by mouth every 6 (six) hours as needed for breakthrough pain. 20 tablet 0  . oxyCODONE (ROXICODONE) 5 MG immediate release tablet Take 1 tablet (5 mg total) by mouth every 6 (six) hours as needed for severe pain. 15 tablet 0  . oxyCODONE-acetaminophen (PERCOCET) 7.5-325 MG tablet Take 1 tablet by mouth 5 (five) times daily as needed for severe pain. 150 tablet 0  . rosuvastatin (CRESTOR) 5 MG tablet Take 5 mg by mouth daily.     Marland Kitchen oxyCODONE-acetaminophen (PERCOCET) 7.5-325 MG tablet Take 1 tablet by mouth 5 (five) times daily as needed for up to 30 days for severe pain. 150 tablet 0  . oxyCODONE-acetaminophen (PERCOCET) 7.5-325 MG tablet Take 1 tablet by mouth 5 (five) times daily as needed for up to 30 days for severe pain. 150 tablet 0   No current facility-administered medications for this visit.     REVIEW OF SYSTEMS:   [X]  denotes positive finding, [ ]  denotes negative finding Cardiac  Comments:  Chest pain or chest pressure:    Shortness of breath upon exertion:    Short of breath when lying flat:    Irregular heart rhythm:        Vascular    Pain in calf, thigh, or hip brought on by ambulation:    Pain in feet at night that wakes you up from your sleep:     Blood clot in your veins:    Leg swelling:         Pulmonary    Oxygen at home:    Productive  cough:     Wheezing:         Neurologic    Sudden weakness in arms or legs:     Sudden numbness in arms or legs:     Sudden onset of difficulty speaking or slurred speech:    Temporary  loss of vision in one eye:     Problems with dizziness:         Gastrointestinal    Blood in stool:      Vomited blood:         Genitourinary    Burning when urinating:     Blood in urine:        Psychiatric    Major depression:         Hematologic    Bleeding problems:    Problems with blood clotting too easily:        Skin    Rashes or ulcers:        Constitutional    Fever or chills:     PHYSICAL EXAM:   Vitals:   12/07/18 1318 12/07/18 1322  BP:  94/62  Pulse:  92  Temp:  99.1 F (37.3 C)  TempSrc:  Temporal  SpO2:  94%  Weight: 67 kg 67 kg  Height: 5' 0.5" (1.537 m) 5' 0.5" (1.537 m)    GENERAL: The patient is a well-nourished female, in no acute distress. The vital signs are documented above. CARDIAC: There is a regular rate and rhythm.  VASCULAR: Palpable pedal pulses. PULMONARY: Nonlabored respirations ABDOMEN: Soft and non-tender with normal pitched bowel sounds.  MUSCULOSKELETAL: There are no major deformities or cyanosis. NEUROLOGIC: No focal weakness or paresthesias are detected. SKIN: There are no ulcers or rashes noted. PSYCHIATRIC: The patient has a normal affect.  STUDIES:   I have reviewed a old CT scan which does show calcification within her infrarenal abdominal aorta.  ASSESSMENT and PLAN   Degenerative back disease: I discussed proceeding with anterior exposure of the L5-S1 disc space.  I discussed the risk of injury to the iliac artery and vein as well as the ureter.  I also discussed the risk of bleeding.  All of her questions were answered.   Leia Alf, MD, FACS [facility] [phone] Pager [phone]

## 2018-12-07 NOTE — Progress Notes (Signed)
[facility] [place]  Patient name: Jacqueline Thornton MRN: [id number] DOB: [birth year] Sex: female   REQUESTING PROVIDER:    Dr. Vertell Limber   REASON FOR CONSULT:    Anterior exposure  HISTORY OF PRESENT ILLNESS:   Jacqueline Thornton is a 69 y.o. female, who is referred today for discussions regarding anterior exposure for L5-S1 instrumentation.  Dr. Vertell Limber has evaluated the patient and feels this is the best approach to address her degenerative back disease.  She has had multiple abdominal surgeries including an open cholecystectomy as well as bladder tacking and hysterectomy.  She is a former smoker.  PAST MEDICAL HISTORY    Past Medical History:  Diagnosis Date  . Absolute anemia 04/11/2015  . Acute postoperative pain 08/07/2017  . Angina pectoris ([place])   . Anxiety   . Arthritis   . 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 Atypical face pain 04/24/2015  . Back pain   . CAD (coronary artery disease)   . Chronic back pain   . Depression   . Fibromyalgia   . GERD (gastroesophageal reflux disease)   . 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) 04/11/2015  . H/O: hysterectomy 1979  . Heart murmur   . Hyperlipidemia   . Hypertension   . Low back pain 04/06/2015  . Lumbar radicular pain 04/18/2015  . Migraine   . Narrowing of intervertebral disc space 04/11/2015  . Sacroiliac joint pain 04/11/2015  . Spine disorder      FAMILY HISTORY   Family History  Problem Relation Age of Onset  . Diabetes Father   . Hypertension Sister   . Diabetes Mother   . Stroke Mother   . Colon cancer Mother   . Colon cancer Maternal Grandmother   . Ovarian cancer Neg Hx   . Breast cancer Neg Hx     SOCIAL HISTORY:   Social History   Socioeconomic History  . Marital status: Married    Spouse name: Not on file  . Number of children: Not on file  . Years of education: Not on file  . Highest education level: Not on file   Occupational History  . Not on file  Social Needs  . Financial resource strain: Not hard at all  . Food insecurity:    Worry: Patient refused    Inability: Patient refused  . Transportation needs:    Medical: Patient refused    Non-medical: Patient refused  Tobacco Use  . Smoking status: Former Smoker    Last attempt to quit: 07/02/2003    Years since quitting: 15.4  . Smokeless tobacco: Never Used  Substance and Sexual Activity  . Alcohol use: No    Alcohol/week: 0.0 standard drinks  . Drug use: No  . Sexual activity: Not Currently  Lifestyle  . Physical activity:    Days per week: Patient refused    Minutes per session: Patient refused  . Stress: Not at all  Relationships  . Social connections:    Talks on phone: Patient refused    Gets together: Patient refused    Attends religious service: Patient refused    Active member of club or organization: Patient refused    Attends meetings of clubs or organizations: Patient refused    Relationship status: Patient refused  . Intimate partner violence:    Fear of current or ex partner: Patient refused    Emotionally abused: Patient refused    Physically abused: Patient refused    Forced sexual activity:  Patient refused  Other Topics Concern  . Not on file  Social History Narrative  . Not on file    ALLERGIES:    No Known Allergies  CURRENT MEDICATIONS:    Current Outpatient Medications  Medication Sig Dispense Refill  . busPIRone (BUSPAR) 7.5 MG tablet Take 7.5 mg by mouth 2 (two) times daily.    . Cholecalciferol (VITAMIN D3) 50000 units CAPS Take 50,000 Units by mouth every Wednesday.   3  . Cyanocobalamin (VITAMIN B-12) 5000 MCG SUBL Place 1 tablet (5,000 mcg total) under the tongue daily. 180 tablet 1  . cyclobenzaprine (FLEXERIL) 10 MG tablet Take 1 tablet (10 mg total) by mouth 3 (three) times daily as needed for muscle spasms. 270 tablet 0  . DULoxetine (CYMBALTA) 60 MG capsule Take 60 mg by mouth at bedtime.      . enalapril (VASOTEC) 5 MG tablet Take 5 mg by mouth daily.    Marland Kitchen esomeprazole (NEXIUM) 20 MG capsule Take 20 mg by mouth daily at 12 noon.    Marland Kitchen estradiol (ESTRACE) 0.5 MG tablet Take 0.5 mg by mouth daily.    Marland Kitchen gabapentin (NEURONTIN) 300 MG capsule Take 1 capsule (300 mg total) by mouth 2 (two) times daily. 180 capsule 0  . ibuprofen (ADVIL,MOTRIN) 600 MG tablet Take 1 tablet (600 mg total) by mouth every 6 (six) hours as needed for mild pain or cramping. 30 tablet 0  . Melatonin 3 MG TABS Take 3 mg by mouth at bedtime as needed (for sleep).     Marland Kitchen oxyCODONE (ROXICODONE) 5 MG immediate release tablet Take 1 tablet (5 mg total) by mouth every 6 (six) hours as needed for breakthrough pain. 20 tablet 0  . oxyCODONE (ROXICODONE) 5 MG immediate release tablet Take 1 tablet (5 mg total) by mouth every 6 (six) hours as needed for severe pain. 15 tablet 0  . oxyCODONE-acetaminophen (PERCOCET) 7.5-325 MG tablet Take 1 tablet by mouth 5 (five) times daily as needed for severe pain. 150 tablet 0  . rosuvastatin (CRESTOR) 5 MG tablet Take 5 mg by mouth daily.     Marland Kitchen oxyCODONE-acetaminophen (PERCOCET) 7.5-325 MG tablet Take 1 tablet by mouth 5 (five) times daily as needed for up to 30 days for severe pain. 150 tablet 0  . oxyCODONE-acetaminophen (PERCOCET) 7.5-325 MG tablet Take 1 tablet by mouth 5 (five) times daily as needed for up to 30 days for severe pain. 150 tablet 0   No current facility-administered medications for this visit.     REVIEW OF SYSTEMS:   [X]  denotes positive finding, [ ]  denotes negative finding Cardiac  Comments:  Chest pain or chest pressure:    Shortness of breath upon exertion:    Short of breath when lying flat:    Irregular heart rhythm:        Vascular    Pain in calf, thigh, or hip brought on by ambulation:    Pain in feet at night that wakes you up from your sleep:     Blood clot in your veins:    Leg swelling:         Pulmonary    Oxygen at home:    Productive  cough:     Wheezing:         Neurologic    Sudden weakness in arms or legs:     Sudden numbness in arms or legs:     Sudden onset of difficulty speaking or slurred speech:    Temporary  loss of vision in one eye:     Problems with dizziness:         Gastrointestinal    Blood in stool:      Vomited blood:         Genitourinary    Burning when urinating:     Blood in urine:        Psychiatric    Major depression:         Hematologic    Bleeding problems:    Problems with blood clotting too easily:        Skin    Rashes or ulcers:        Constitutional    Fever or chills:     PHYSICAL EXAM:   Vitals:   12/07/18 1318 12/07/18 1322  BP:  94/62  Pulse:  92  Temp:  99.1 F (37.3 C)  TempSrc:  Temporal  SpO2:  94%  Weight: 67 kg 67 kg  Height: 5' 0.5" (1.537 m) 5' 0.5" (1.537 m)    GENERAL: The patient is a well-nourished female, in no acute distress. The vital signs are documented above. CARDIAC: There is a regular rate and rhythm.  VASCULAR: Palpable pedal pulses. PULMONARY: Nonlabored respirations ABDOMEN: Soft and non-tender with normal pitched bowel sounds.  MUSCULOSKELETAL: There are no major deformities or cyanosis. NEUROLOGIC: No focal weakness or paresthesias are detected. SKIN: There are no ulcers or rashes noted. PSYCHIATRIC: The patient has a normal affect.  STUDIES:   I have reviewed a old CT scan which does show calcification within her infrarenal abdominal aorta.  ASSESSMENT and PLAN   Degenerative back disease: I discussed proceeding with anterior exposure of the L5-S1 disc space.  I discussed the risk of injury to the iliac artery and vein as well as the ureter.  I also discussed the risk of bleeding.  All of her questions were answered.   Leia Alf, MD, FACS [facility] [phone] Pager [phone]

## 2018-12-08 ENCOUNTER — Encounter: Payer: Self-pay | Admitting: Pain Medicine

## 2018-12-08 ENCOUNTER — Telehealth: Payer: Self-pay

## 2018-12-08 NOTE — Telephone Encounter (Signed)
Attempted to call patient for virtual visit questions, left message to call office.

## 2018-12-09 ENCOUNTER — Ambulatory Visit: Payer: Medicare Other | Attending: Nurse Practitioner | Admitting: Pain Medicine

## 2018-12-09 ENCOUNTER — Other Ambulatory Visit: Payer: Self-pay

## 2018-12-09 ENCOUNTER — Encounter: Payer: Medicare Other | Admitting: Nurse Practitioner

## 2018-12-09 DIAGNOSIS — M5416 Radiculopathy, lumbar region: Secondary | ICD-10-CM | POA: Diagnosis not present

## 2018-12-09 DIAGNOSIS — G8929 Other chronic pain: Secondary | ICD-10-CM

## 2018-12-09 DIAGNOSIS — M797 Fibromyalgia: Secondary | ICD-10-CM | POA: Diagnosis not present

## 2018-12-09 DIAGNOSIS — E538 Deficiency of other specified B group vitamins: Secondary | ICD-10-CM

## 2018-12-09 DIAGNOSIS — M545 Low back pain, unspecified: Secondary | ICD-10-CM

## 2018-12-09 DIAGNOSIS — M7918 Myalgia, other site: Secondary | ICD-10-CM

## 2018-12-09 DIAGNOSIS — M792 Neuralgia and neuritis, unspecified: Secondary | ICD-10-CM

## 2018-12-09 DIAGNOSIS — G894 Chronic pain syndrome: Secondary | ICD-10-CM | POA: Diagnosis not present

## 2018-12-09 DIAGNOSIS — D519 Vitamin B12 deficiency anemia, unspecified: Secondary | ICD-10-CM

## 2018-12-09 LAB — NOVEL CORONAVIRUS, NAA (HOSP ORDER, SEND-OUT TO REF LAB; TAT 18-24 HRS): SARS-CoV-2, NAA: NOT DETECTED

## 2018-12-09 MED ORDER — OXYCODONE-ACETAMINOPHEN 7.5-325 MG PO TABS: 1.0000 | ORAL_TABLET | Freq: Every day | ORAL | 0 refills | Status: DC

## 2018-12-09 MED ORDER — CYCLOBENZAPRINE HCL 10 MG PO TABS: 10.0000 mg | ORAL_TABLET | Freq: Three times a day (TID) | ORAL | 0 refills | Status: DC | PRN

## 2018-12-09 MED ORDER — GABAPENTIN 300 MG PO CAPS: 300.0000 mg | ORAL_CAPSULE | Freq: Two times a day (BID) | ORAL | 0 refills | Status: DC

## 2018-12-09 MED ORDER — VITAMIN B-12 5000 MCG SL SUBL: 5000.0000 ug | SUBLINGUAL_TABLET | Freq: Every day | SUBLINGUAL | 0 refills | Status: DC

## 2018-12-09 NOTE — Progress Notes (Signed)
Pain Management Virtual Encounter Note - Virtual Visit via Telephone Telehealth (real-time audio visits between healthcare provider and patient).   Patient's Phone No. & Preferred Pharmacy:  [phone] (home); There is no such number on file (mobile).; (Preferred) [phone] [email or web address]  [place] (N), [place] [address] [place] ([place]) [place] [address] Phone: [phone] Fax: [phone]    Pre-screening note:  Our staff contacted Jacqueline Thornton and offered her an "in person", "face-to-face" appointment versus a telephone encounter. She indicated preferring the telephone encounter, at this time.   Reason for Virtual Visit: COVID-19*  Social distancing based on CDC and AMA recommendations.   I contacted Jacqueline Thornton on 12/09/2018 via telephone.      I clearly identified myself as Gaspar Cola, MD. I verified that I was speaking with the correct person using two identifiers (Name: Jacqueline Thornton, and date of birth: [birth year]).  Advanced Informed Consent I sought verbal advanced consent from Jacqueline Thornton for virtual visit interactions. I informed Jacqueline Thornton of possible security and privacy concerns, risks, and limitations associated with providing "not-in-person" medical evaluation and management services. I also informed Jacqueline Thornton of the availability of "in-person" appointments. Finally, I informed her that there would be a charge for the virtual visit and that she could be  personally, fully or partially, financially responsible for it. Jacqueline Thornton expressed understanding and agreed to proceed.   Historic Elements   Jacqueline Thornton is a 68 y.o. year old, female patient evaluated today after her last encounter by our practice on 12/08/2018. Ms. Shawn  has a past medical history of Absolute anemia (04/11/2015), Acute postoperative pain (08/07/2017), Angina pectoris ([place]), Anxiety, Arthritis,  Arthropathy of sacroiliac joint (04/11/2015), Atypical face pain (04/24/2015), Back pain, CAD (coronary artery disease), Chronic back pain, Depression, Fibromyalgia, GERD (gastroesophageal reflux disease), 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) (04/11/2015), H/O: hysterectomy (1979), Heart murmur, Hyperlipidemia, Hypertension, Low back pain (04/06/2015), Lumbar radicular pain (04/18/2015), Migraine, Narrowing of intervertebral disc space (04/11/2015), Sacroiliac joint pain (04/11/2015), and Spine disorder. She also  has a past surgical history that includes Cholecystectomy; Abdominal hysterectomy; Neck surgery; Shoulder surgery; Colonoscopy with propofol (N/A, 07/19/2015); caract surger; Laparoscopic salpingo oophorectomy (Bilateral, 03/03/2018); Cystoscopy (03/03/2018); Tonsillectomy; Appendectomy; and Tumor removal. Jacqueline Thornton has a current medication list which includes the following prescription(s): vitamin d3, vitamin b-12, cyclobenzaprine, duloxetine, enalapril, esomeprazole, estradiol, gabapentin, hydrochlorothiazide, melatonin, meloxicam, oxycodone-acetaminophen, oxycodone-acetaminophen, oxycodone-acetaminophen, and rosuvastatin. She  reports that she quit smoking about 15 years ago. She has never used smokeless tobacco. She reports that she does not drink alcohol or use drugs. Jacqueline Thornton has No Known Allergies.   HPI  Today, she is being contacted for medication management.  The patient indicates doing well on her medications and not needing any changes at this time.  However, she has been having a lot of problems with her back pain and leg pain where her legs have gone numb.  She indicated that she had some x-rays done of her back which showed significant deterioration and loss of disc height.  According to her, based on those x-rays Dr. Vertell Limber decided to do surgery on April 17.  However, due to the COVID restrictions, this was close follow-up.  She indicates that they put her back on the schedule for this  Friday.  Jacqueline Thornton indicates that she was told she would have some cages implanted in her lumbar spine and she wanted to know if that would help  her pain.  She voiced that her worst pain is in the lower back but she is having numbness in both lower extremities to the point that she is having difficulty ambulating.  I informed the patient that back surgeries are usually better for leg problems, but there is always the possibility that by placing a cage device this may increase the distance between the vertebral bodies relieving some of the pressure on the facet joints and also opening up the neural foramina so asked to decrease both of those problems.  I also told the patient to make sure that she talk to Dr. Vertell Limber about it so that as a Psychologist, sport and exercise he can answer all of her questions.  Pharmacotherapy Assessment  Analgesic: Oxycodone 7.5/325 one tablet 5 times a day. (56.25 mg/day) MME/day:56.25 mg/day  Monitoring: Pharmacotherapy: No side-effects or adverse reactions reported. [place] PMP: PDMP reviewed during this encounter.       Compliance: No problems identified. Effectiveness: Clinically acceptable. Plan: Refer to "POC".  Pertinent Labs   SAFETY SCREENING Profile Lab Results  Component Value Date   SARSCOV2NAA NOT DETECTED 12/07/2018   COVIDSOURCE NASOPHARYNGEAL 12/07/2018   Renal Function Lab Results  Component Value Date   BUN 15 03/08/2018   CREATININE 0.97 03/08/2018   BCR 9 (L) 12/05/2016   GFRAA >60 03/08/2018   GFRNONAA 60 (L) 03/08/2018   Hepatic Function Lab Results  Component Value Date   AST 14 (L) 03/08/2018   ALT 14 03/08/2018   ALBUMIN 3.3 (L) 03/08/2018   UDS Summary  Date Value Ref Range Status  06/09/2018 FINAL  Final    Comment:    ==================================================================== TOXASSURE SELECT 13 (MW) ==================================================================== Test                             Result       Flag        Units Drug Present and Declared for Prescription Verification   Oxycodone                      3546         EXPECTED   ng/mg creat   Oxymorphone                    1252         EXPECTED   ng/mg creat   Noroxycodone                   5124         EXPECTED   ng/mg creat   Noroxymorphone                 405          EXPECTED   ng/mg creat    Sources of oxycodone are scheduled prescription medications.    Oxymorphone, noroxycodone, and noroxymorphone are expected    metabolites of oxycodone. Oxymorphone is also available as a    scheduled prescription medication. ==================================================================== Test                      Result    Flag   Units      Ref Range   Creatinine              85               mg/dL      >=20 ==================================================================== Declared Medications:  The  flagging and interpretation on this report are based on the  following declared medications.  Unexpected results may arise from  inaccuracies in the declared medications.  **Note: The testing scope of this panel includes these medications:  Oxycodone  Oxycodone (Oxycodone Acetaminophen)  **Note: The testing scope of this panel does not include following  reported medications:  Acetaminophen (Oxycodone Acetaminophen)  Cholecalciferol  Cyanocobalamin  Cyclobenzaprine  Duloxetine  Enalapril  Esomeprazole  Gabapentin  Ibuprofen (Advil)  Melatonin  Rosuvastatin  Topical ==================================================================== For clinical consultation, please call [phone]. ====================================================================    Note: Above Lab results reviewed.  Recent imaging  DG C-Arm 1-60 Min-No Report Fluoroscopy was utilized by the requesting physician.  No radiographic  interpretation.   Assessment  The primary encounter diagnosis was Chronic pain syndrome. Diagnoses of Chronic lumbar radicular  pain (Primary Area of Pain) (Left) (S1 Dermatome), Chronic low back pain (Secondary area of Pain) (Bilateral) (midline to tailbone) (R>L), Fibromyalgia, Myofascial pain, Neurogenic pain, Anemia due to vitamin B12 deficiency, unspecified B12 deficiency type, and Vitamin B12 deficiency were also pertinent to this visit.  Plan of Care  I have discontinued Jacqueline Thornton ibuprofen and oxyCODONE. I have also changed her oxyCODONE-acetaminophen. Additionally, I am having her start on oxyCODONE-acetaminophen and oxyCODONE-acetaminophen. Lastly, I am having her maintain her esomeprazole, enalapril, DULoxetine, rosuvastatin, Melatonin, Vitamin D3, estradiol, hydrochlorothiazide, meloxicam, cyclobenzaprine, gabapentin, and Vitamin B-12.  Pharmacotherapy (Medications Ordered): Meds ordered this encounter  Medications  . oxyCODONE-acetaminophen (PERCOCET) 7.5-325 MG tablet    Sig: Take 1 tablet by mouth 5 (five) times daily for 30 days. Must last 30 days    Dispense:  150 tablet    Refill:  0    Chronic Pain: STOP Act - Not applicable. Fill 1 day early if closed on scheduled refill date. Do not fill until: 12/23/2018. To last until: 01/22/2019. Instruct to avoid benzodiazepines within 8 hours of opioid.  . cyclobenzaprine (FLEXERIL) 10 MG tablet    Sig: Take 1 tablet (10 mg total) by mouth 3 (three) times daily as needed for muscle spasms.    Dispense:  276 tablet    Refill:  0    Fill one day early if pharmacy is closed on scheduled refill date. May substitute for generic if available.  . gabapentin (NEURONTIN) 300 MG capsule    Sig: Take 1 capsule (300 mg total) by mouth 2 (two) times daily.    Dispense:  184 capsule    Refill:  0    Fill one day early if pharmacy is closed on scheduled refill date. May substitute for generic if available.  . Cyanocobalamin (VITAMIN B-12) 5000 MCG SUBL    Sig: Place 1 tablet (5,000 mcg total) under the tongue daily.    Dispense:  90 tablet    Refill:  0    Fill  one day early if pharmacy is closed on scheduled refill date. May substitute for generic if available.  Marland Kitchen oxyCODONE-acetaminophen (PERCOCET) 7.5-325 MG tablet    Sig: Take 1 tablet by mouth 5 (five) times daily for 30 days. Must last 30 days    Dispense:  150 tablet    Refill:  0    Chronic Pain: STOP Act - Not applicable. Fill 1 day early if closed on scheduled refill date. Do not fill until: 01/22/2019. To last until: 02/21/2019. Instruct to avoid benzodiazepines within 8 hours of opioid.  Marland Kitchen oxyCODONE-acetaminophen (PERCOCET) 7.5-325 MG tablet    Sig: Take 1 tablet by mouth 5 (five) times  daily for 30 days. Must last 30 days    Dispense:  150 tablet    Refill:  0    Chronic Pain: STOP Act - Not applicable. Fill 1 day early if closed on scheduled refill date. Do not fill until: 02/21/2019. To last until: 03/23/2019. Instruct to avoid benzodiazepines within 8 hours of opioid.   Orders:  No orders of the defined types were placed in this encounter.  Follow-up plan:   Return in about 3 months (around 03/22/2019) for (Virtual), E/M, (Med-Mgmt).    I discussed the assessment and treatment plan with the patient. The patient was provided an opportunity to ask questions and all were answered. The patient agreed with the plan and demonstrated an understanding of the instructions.  Patient advised to call back or seek an in-person evaluation if the symptoms or condition worsens.  Total duration of non-face-to-face encounter: 18 minutes.  Note by: Gaspar Cola, MD Date: 12/09/2018; Time: 12:53 PM  Note: This dictation was prepared with Dragon dictation. Any transcriptional errors that may result from this process are unintentional.  Disclaimer:  * Given the special circumstances of the COVID-19 pandemic, the federal government has announced that the Office for Civil Rights (OCR) will exercise its enforcement discretion and will not impose penalties on physicians using telehealth in the event of  noncompliance with regulatory requirements under the [place] (HIPAA) in connection with the good faith provision of telehealth during the JSCBI-37 national public health emergency. ([place])

## 2018-12-09 NOTE — Progress Notes (Addendum)
[place] (N), [place] [address] [place]) [place] [address] Phone: [phone] Fax: [phone]      Your procedure is scheduled on Friday, 12/11/18.  Report to [facility] Main Entrance "A" at 5:30 A.M., and check in at the Admitting office.  Call this number if you have problems the morning of surgery:  [phone]  Call [phone] if you have any questions prior to your surgery date Monday-Friday 8am-4pm    Remember:  Do not eat or drink after midnight Thursday.    Take these medicines the morning of surgery with A SIP OF WATER  cyclobenzaprine (FLEXERIL) if needed esomeprazole (NEXIUM) estradiol (ESTRACE)  gabapentin (NEURONTIN)  oxyCODONE-acetaminophen if needed rosuvastatin (CRESTOR)  STOP now taking any Aspirin (unless otherwise instructed by your surgeon), Aleve, Naproxen, Ibuprofen, Motrin, Advil, Goody's, BC's, all herbal medications, fish oil, and all vitamins.    The Morning of Surgery  Do not wear jewelry, make-up or nail polish.  Do not wear lotions, powders, or perfumes or deodorant  Do not shave 48 hours prior to surgery.    Do not bring valuables to the hospital.  [facility] is not responsible for any belongings or valuables.  Contacts, glasses, hearing aids, dentures or bridgework may not be worn into surgery.    For patients admitted to the hospital, discharge time will be determined by your treatment team.  Patients discharged the day of surgery will not be allowed to drive home.    Special instructions:   [facility]- Preparing For Surgery  Before surgery, you can play an important role. Because skin is not sterile, your skin needs to be as free of germs as possible. You can reduce the number of germs on your skin by washing with CHG (chlorahexidine gluconate) Soap before surgery.  CHG is an antiseptic cleaner which kills germs and bonds with the skin to continue killing germs  even after washing.    Oral Hygiene is also important to reduce your risk of infection.  Remember - BRUSH YOUR TEETH THE MORNING OF SURGERY WITH YOUR REGULAR TOOTHPASTE  Please do not use if you have an allergy to CHG or antibacterial soaps. If your skin becomes reddened/irritated stop using the CHG.  Do not shave (including legs and underarms) for at least 48 hours prior to first CHG shower. It is OK to shave your face.  Please follow these instructions carefully.   1. Shower the [employer] BEFORE SURGERY (Thurs) and the MORNING OF SURGERY (Fri) with CHG Soap.   2. If you chose to wash your hair, wash your hair first as usual with your normal shampoo.  3. After you shampoo, rinse your hair and body thoroughly to remove the shampoo.  4. Use CHG as you would any other liquid soap. You can apply CHG directly to the skin and wash gently with a scrungie or a clean washcloth.   5. Apply the CHG Soap to your body ONLY FROM THE NECK DOWN.  Do not use on open wounds or open sores. Avoid contact with your eyes, ears, mouth and genitals (private parts). Wash Face and genitals (private parts)  with your normal soap.   6. Wash thoroughly, paying special attention to the area where your surgery will be performed.  7. Thoroughly rinse your body with warm water from the neck down.  8. DO NOT shower/wash with your normal soap after using and rinsing off the CHG Soap.  9. Pat yourself dry  with a CLEAN TOWEL.  10. Wear CLEAN PAJAMAS to bed the night before surgery, wear comfortable clothes the morning of surgery  11. Place CLEAN SHEETS on your bed the night of your first shower and DO NOT SLEEP WITH PETS.   Day of Surgery:  Do not apply any deodorants/lotions.  Please wear clean clothes to the hospital/surgery center.   Remember to brush your teeth WITH YOUR REGULAR TOOTHPASTE.   Please read over the following fact sheets that you were given.

## 2018-12-10 ENCOUNTER — Encounter (HOSPITAL_COMMUNITY)
Admission: RE | Admit: 2018-12-10 | Discharge: 2018-12-10 | Disposition: A | Discharge status: Home / Self Care | Payer: Medicare Other | Source: Ambulatory Visit | Attending: Nephrology | Admitting: Nephrology

## 2018-12-10 ENCOUNTER — Other Ambulatory Visit: Payer: Self-pay

## 2018-12-10 ENCOUNTER — Other Ambulatory Visit (HOSPITAL_COMMUNITY): Payer: Medicare Other

## 2018-12-10 ENCOUNTER — Encounter (HOSPITAL_COMMUNITY): Payer: Self-pay

## 2018-12-10 HISTORY — DX: Malignant (primary) neoplasm, unspecified: C80.1

## 2018-12-10 LAB — BASIC METABOLIC PANEL
Anion gap: 8 (ref 5–15)
BUN: 7 mg/dL — ABNORMAL LOW (ref 8–23)
CO2: 27 mmol/L (ref 22–32)
Calcium: 9 mg/dL (ref 8.9–10.3)
Chloride: 104 mmol/L (ref 98–111)
Creatinine, Ser: 1 mg/dL (ref 0.44–1.00)
GFR calc Af Amer: >60 mL/min (ref >60)
GFR calc non Af Amer: 59 mL/min — ABNORMAL LOW (ref >60)
Glucose, Bld: 110 mg/dL — ABNORMAL HIGH (ref 70–99)
Potassium: 3.8 mmol/L (ref 3.5–5.1)
Sodium: 139 mmol/L (ref 135–145)

## 2018-12-10 LAB — TYPE AND SCREEN
ABO/RH(D): A POS
Antibody Screen: NEGATIVE

## 2018-12-10 LAB — CBC
HCT: 36.4 % (ref 36.0–46.0)
Hemoglobin: 11.7 g/dL — ABNORMAL LOW (ref 12.0–15.0)
MCH: 32.4 pg (ref 26.0–34.0)
MCHC: 32.1 g/dL (ref 30.0–36.0)
MCV: 100.8 fL — ABNORMAL HIGH (ref 80.0–100.0)
Platelets: 207 10*3/uL (ref 150–400)
RBC: 3.61 MIL/uL — ABNORMAL LOW (ref 3.87–5.11)
RDW: 12.6 % (ref 11.5–15.5)
WBC: 5.6 10*3/uL (ref 4.0–10.5)
nRBC: 0 % (ref 0.0–0.2)

## 2018-12-10 LAB — ABO/RH: ABO/RH(D): A POS

## 2018-12-10 LAB — SURGICAL PCR SCREEN
MRSA, PCR: NEGATIVE
Staphylococcus aureus: NEGATIVE

## 2018-12-10 NOTE — Progress Notes (Signed)
Call to Dr. Smiley Houseman- [facility]- cardiac & requested that the last OV note from both doctors be sent along with the last EKG & last ECHO be sent to [phone].

## 2018-12-10 NOTE — Anesthesia Preprocedure Evaluation (Addendum)
Anesthesia Evaluation  Patient identified by MRN, date of birth, ID band Patient awake    Reviewed: Allergy & Precautions, NPO status , Patient's Chart, lab work & pertinent test results  Airway Mallampati: II  TM Distance: >3 FB Neck ROM: Full    Dental  (+) Edentulous Upper, Edentulous Lower   Pulmonary former smoker,    breath sounds clear to auscultation       Cardiovascular hypertension,  Rhythm:Regular Rate:Normal     Neuro/Psych    GI/Hepatic   Endo/Other    Renal/GU      Musculoskeletal   Abdominal   Peds  Hematology   Anesthesia Other Findings   Reproductive/Obstetrics                            Anesthesia Physical Anesthesia Plan  ASA: III  Anesthesia Plan: General   Post-op Pain Management:    Induction: Intravenous  PONV Risk Score and Plan: Ondansetron  Airway Management Planned: Oral ETT  Additional Equipment: Arterial line  Intra-op Plan:   Post-operative Plan: Extubation in OR  Informed Consent: I have reviewed the patients History and Physical, chart, labs and discussed the procedure including the risks, benefits and alternatives for the proposed anesthesia with the patient or authorized representative who has indicated his/her understanding and acceptance.       Plan Discussed with: CRNA and Anesthesiologist  Anesthesia Plan Comments: (Followed by cardiology Dr. Neoma Laming for mild AR and mild-mod MR. She was seen on 08/31/18 for preop clearance. Per note "echo 02/25/2018 showed mild aortic and mild to moderate mitral regurgitation.  Cleared for back surgery, follow-up 6 months or sooner if any changes."  TTE 02/25/18 (copy on pt chart): Mildly dilated left atrium with left ventricle, right atrium and ventricle, and aorta appear normal in size.   Normal LV systolic function.  EF 72%.   Normal wall motion.   Mild left ventricular hypertrophy with grade 1  diastolic dysfunction.   Mild pulmonary regurgitation.   Mild tricuspid regurgitation.   Normal pulmonary artery pressure.   Mild to moderate mitral regurgitation.   Fibro-calcified aortic valve with mild aortic regurgitation.   No pericardial effusion.)       Anesthesia Quick Evaluation

## 2018-12-10 NOTE — Progress Notes (Signed)
PCP - [place], [place]  Cardiologist - [place], [place]   Chest x-ray -  EKG - 08/2018 Stress Test -  ECHO - 01/2018 Cardiac Cath -   Sleep Study - n/a CPAP -   Fasting Blood Sugar - n/a Checks Blood Sugar _____ times a day  Blood Thinner Instructions:n/a Aspirin Instructions:  Anesthesia review: yes- sending   Patient denies shortness of breath, fever, cough and chest pain at PAT appointment   Patient verbalized understanding of instructions that were given to them at the PAT appointment. Patient was also instructed that they will need to review over the PAT instructions again at home before surgery.    Addendum: Spoke with Jeneen Rinks, PA for anesth., will have this chart reviewed

## 2018-12-11 ENCOUNTER — Encounter (HOSPITAL_COMMUNITY): Payer: Self-pay | Admitting: Certified Registered"

## 2018-12-11 ENCOUNTER — Inpatient Hospital Stay (HOSPITAL_COMMUNITY): Payer: Medicare Other

## 2018-12-11 ENCOUNTER — Encounter (HOSPITAL_COMMUNITY)
Admission: RE | Disposition: A | Discharge status: Home / Self Care | Payer: Self-pay | Source: Home / Self Care | Attending: Neurosurgery

## 2018-12-11 ENCOUNTER — Inpatient Hospital Stay (HOSPITAL_COMMUNITY): Payer: Medicare Other | Admitting: Anesthesiology

## 2018-12-11 ENCOUNTER — Inpatient Hospital Stay (HOSPITAL_COMMUNITY): Payer: Medicare Other | Admitting: Physician Assistant

## 2018-12-11 ENCOUNTER — Inpatient Hospital Stay (HOSPITAL_COMMUNITY)
Admission: RE | Admit: 2018-12-11 | Discharge: 2018-12-12 | DRG: 460 | Disposition: A | Discharge status: Home Health | Payer: Medicare Other | Attending: Neurosurgery | Admitting: Neurosurgery

## 2018-12-11 DIAGNOSIS — F410 Panic disorder [episodic paroxysmal anxiety] without agoraphobia: Secondary | ICD-10-CM | POA: Diagnosis present

## 2018-12-11 DIAGNOSIS — Z87891 Personal history of nicotine dependence: Secondary | ICD-10-CM

## 2018-12-11 DIAGNOSIS — Z79899 Other long term (current) drug therapy: Secondary | ICD-10-CM | POA: Diagnosis not present

## 2018-12-11 DIAGNOSIS — M797 Fibromyalgia: Secondary | ICD-10-CM | POA: Diagnosis present

## 2018-12-11 DIAGNOSIS — M5117 Intervertebral disc disorders with radiculopathy, lumbosacral region: Principal | ICD-10-CM | POA: Diagnosis present

## 2018-12-11 DIAGNOSIS — Z419 Encounter for procedure for purposes other than remedying health state, unspecified: Secondary | ICD-10-CM

## 2018-12-11 DIAGNOSIS — M545 Low back pain: Secondary | ICD-10-CM | POA: Diagnosis present

## 2018-12-11 DIAGNOSIS — F329 Major depressive disorder, single episode, unspecified: Secondary | ICD-10-CM | POA: Diagnosis present

## 2018-12-11 DIAGNOSIS — I2510 Atherosclerotic heart disease of native coronary artery without angina pectoris: Secondary | ICD-10-CM | POA: Diagnosis present

## 2018-12-11 DIAGNOSIS — I10 Essential (primary) hypertension: Secondary | ICD-10-CM | POA: Diagnosis present

## 2018-12-11 DIAGNOSIS — M4316 Spondylolisthesis, lumbar region: Secondary | ICD-10-CM | POA: Diagnosis present

## 2018-12-11 DIAGNOSIS — E785 Hyperlipidemia, unspecified: Secondary | ICD-10-CM | POA: Diagnosis present

## 2018-12-11 DIAGNOSIS — M4807 Spinal stenosis, lumbosacral region: Secondary | ICD-10-CM | POA: Diagnosis present

## 2018-12-11 DIAGNOSIS — M4317 Spondylolisthesis, lumbosacral region: Secondary | ICD-10-CM | POA: Diagnosis present

## 2018-12-11 DIAGNOSIS — M5137 Other intervertebral disc degeneration, lumbosacral region: Secondary | ICD-10-CM

## 2018-12-11 DIAGNOSIS — K219 Gastro-esophageal reflux disease without esophagitis: Secondary | ICD-10-CM | POA: Diagnosis present

## 2018-12-11 HISTORY — PX: ANTERIOR LUMBAR FUSION: SHX1170

## 2018-12-11 HISTORY — PX: ABDOMINAL EXPOSURE: SHX5708

## 2018-12-11 SURGERY — ANTERIOR LUMBAR FUSION 1 LEVEL
Anesthesia: General

## 2018-12-11 MED ORDER — DEXAMETHASONE SODIUM PHOSPHATE 10 MG/ML IJ SOLN
INTRAMUSCULAR | Status: AC
  Filled 2018-12-11: qty 1

## 2018-12-11 MED ORDER — FENTANYL CITRATE (PF) 250 MCG/5ML IJ SOLN
INTRAMUSCULAR | Status: AC
  Filled 2018-12-11: qty 5

## 2018-12-11 MED ORDER — MENTHOL 3 MG MT LOZG: 1.0000 | LOZENGE | OROMUCOSAL | Status: DC | PRN

## 2018-12-11 MED ORDER — ESTRADIOL 1 MG PO TABS
0.5000 mg | ORAL_TABLET | Freq: Every day | ORAL | Status: DC
  Administered 2018-12-11: 0.5 mg via ORAL
  Filled 2018-12-11: qty 0.5

## 2018-12-11 MED ORDER — ROCURONIUM BROMIDE 50 MG/5ML IV SOSY
PREFILLED_SYRINGE | INTRAVENOUS | Status: DC | PRN
  Administered 2018-12-11: 50 mg via INTRAVENOUS

## 2018-12-11 MED ORDER — PHENYLEPHRINE 40 MCG/ML (10ML) SYRINGE FOR IV PUSH (FOR BLOOD PRESSURE SUPPORT)
PREFILLED_SYRINGE | INTRAVENOUS | Status: AC
  Filled 2018-12-11: qty 10

## 2018-12-11 MED ORDER — ACETAMINOPHEN 650 MG RE SUPP: 650.0000 mg | RECTAL | Status: DC | PRN

## 2018-12-11 MED ORDER — METHOCARBAMOL 1000 MG/10ML IJ SOLN
500.0000 mg | Freq: Four times a day (QID) | INTRAVENOUS | Status: DC | PRN
  Filled 2018-12-11: qty 5

## 2018-12-11 MED ORDER — ENALAPRIL MALEATE 5 MG PO TABS
5.0000 mg | ORAL_TABLET | Freq: Every day | ORAL | Status: DC
  Administered 2018-12-12: 5 mg via ORAL
  Filled 2018-12-11: qty 1

## 2018-12-11 MED ORDER — ONDANSETRON HCL 4 MG/2ML IJ SOLN: 4.0000 mg | Freq: Once | INTRAMUSCULAR | Status: DC | PRN

## 2018-12-11 MED ORDER — SUCCINYLCHOLINE CHLORIDE 200 MG/10ML IV SOSY
PREFILLED_SYRINGE | INTRAVENOUS | Status: AC
  Filled 2018-12-11: qty 10

## 2018-12-11 MED ORDER — ROSUVASTATIN CALCIUM 5 MG PO TABS
5.0000 mg | ORAL_TABLET | Freq: Every day | ORAL | Status: DC
  Administered 2018-12-11: 22:00:00 5 mg via ORAL
  Filled 2018-12-11: qty 1

## 2018-12-11 MED ORDER — OXYCODONE HCL 5 MG/5ML PO SOLN: 5.0000 mg | Freq: Once | ORAL | Status: DC | PRN

## 2018-12-11 MED ORDER — ONDANSETRON HCL 4 MG/2ML IJ SOLN
INTRAMUSCULAR | Status: DC | PRN
  Administered 2018-12-11: 4 mg via INTRAVENOUS

## 2018-12-11 MED ORDER — FENTANYL CITRATE (PF) 100 MCG/2ML IJ SOLN
25.0000 ug | INTRAMUSCULAR | Status: DC | PRN
  Administered 2018-12-11 (×2): 25 ug via INTRAVENOUS

## 2018-12-11 MED ORDER — VITAMIN D (ERGOCALCIFEROL) 1.25 MG (50000 UNIT) PO CAPS: 50000.0000 [IU] | ORAL_CAPSULE | ORAL | Status: DC

## 2018-12-11 MED ORDER — GABAPENTIN 300 MG PO CAPS
300.0000 mg | ORAL_CAPSULE | Freq: Two times a day (BID) | ORAL | Status: DC
  Administered 2018-12-11 – 2018-12-12 (×2): 300 mg via ORAL
  Filled 2018-12-11 (×2): qty 1

## 2018-12-11 MED ORDER — KCL IN DEXTROSE-NACL 20-5-0.45 MEQ/L-%-% IV SOLN: INTRAVENOUS | Status: DC

## 2018-12-11 MED ORDER — HEMOSTATIC AGENTS (NO CHARGE) OPTIME
TOPICAL | Status: DC | PRN
  Administered 2018-12-11: 1 via TOPICAL

## 2018-12-11 MED ORDER — PROPOFOL 10 MG/ML IV BOLUS
INTRAVENOUS | Status: DC | PRN
  Administered 2018-12-11: 120 mg via INTRAVENOUS

## 2018-12-11 MED ORDER — OXYCODONE HCL 5 MG PO TABS
2.5000 mg | ORAL_TABLET | Freq: Every day | ORAL | Status: DC
  Administered 2018-12-11: 2.5 mg via ORAL
  Filled 2018-12-11: qty 1

## 2018-12-11 MED ORDER — SUGAMMADEX SODIUM 200 MG/2ML IV SOLN
INTRAVENOUS | Status: DC | PRN
  Administered 2018-12-11: 200 mg via INTRAVENOUS

## 2018-12-11 MED ORDER — THROMBIN 5000 UNITS EX SOLR
CUTANEOUS | Status: AC
  Filled 2018-12-11: qty 5000

## 2018-12-11 MED ORDER — PHENYLEPHRINE 40 MCG/ML (10ML) SYRINGE FOR IV PUSH (FOR BLOOD PRESSURE SUPPORT)
PREFILLED_SYRINGE | INTRAVENOUS | Status: DC | PRN
  Administered 2018-12-11: 120 ug via INTRAVENOUS
  Administered 2018-12-11: 80 ug via INTRAVENOUS

## 2018-12-11 MED ORDER — SUCCINYLCHOLINE CHLORIDE 200 MG/10ML IV SOSY
PREFILLED_SYRINGE | INTRAVENOUS | Status: DC | PRN
  Administered 2018-12-11: 120 mg via INTRAVENOUS

## 2018-12-11 MED ORDER — ALUM & MAG HYDROXIDE-SIMETH 200-200-20 MG/5ML PO SUSP: 30.0000 mL | Freq: Four times a day (QID) | ORAL | Status: DC | PRN

## 2018-12-11 MED ORDER — ZOLPIDEM TARTRATE 5 MG PO TABS: 5.0000 mg | ORAL_TABLET | Freq: Every evening | ORAL | Status: DC | PRN

## 2018-12-11 MED ORDER — DOCUSATE SODIUM 100 MG PO CAPS
100.0000 mg | ORAL_CAPSULE | Freq: Two times a day (BID) | ORAL | Status: DC
  Administered 2018-12-11 – 2018-12-12 (×2): 100 mg via ORAL
  Filled 2018-12-11 (×2): qty 1

## 2018-12-11 MED ORDER — ONDANSETRON HCL 4 MG/2ML IJ SOLN
INTRAMUSCULAR | Status: AC
  Filled 2018-12-11: qty 2

## 2018-12-11 MED ORDER — CYCLOBENZAPRINE HCL 10 MG PO TABS
10.0000 mg | ORAL_TABLET | Freq: Three times a day (TID) | ORAL | Status: DC | PRN
  Administered 2018-12-11: 16:00:00 10 mg via ORAL
  Filled 2018-12-11: qty 1

## 2018-12-11 MED ORDER — SODIUM CHLORIDE 0.9 % IV SOLN: 250.0000 mL | INTRAVENOUS | Status: DC

## 2018-12-11 MED ORDER — ONDANSETRON HCL 4 MG/2ML IJ SOLN: 4.0000 mg | Freq: Four times a day (QID) | INTRAMUSCULAR | Status: DC | PRN

## 2018-12-11 MED ORDER — ROCURONIUM BROMIDE 10 MG/ML (PF) SYRINGE
PREFILLED_SYRINGE | INTRAVENOUS | Status: AC
  Filled 2018-12-11: qty 10

## 2018-12-11 MED ORDER — LACTATED RINGERS IV SOLN
INTRAVENOUS | Status: DC | PRN
  Administered 2018-12-11: 08:00:00 via INTRAVENOUS

## 2018-12-11 MED ORDER — MIDAZOLAM HCL 5 MG/5ML IJ SOLN
INTRAMUSCULAR | Status: DC | PRN
  Administered 2018-12-11: 2 mg via INTRAVENOUS

## 2018-12-11 MED ORDER — CEFAZOLIN SODIUM-DEXTROSE 2-4 GM/100ML-% IV SOLN
2.0000 g | Freq: Three times a day (TID) | INTRAVENOUS | Status: AC
  Administered 2018-12-11 (×2): 2 g via INTRAVENOUS
  Filled 2018-12-11 (×2): qty 100

## 2018-12-11 MED ORDER — LACTATED RINGERS IV SOLN
INTRAVENOUS | Status: DC | PRN
  Administered 2018-12-11: 07:00:00 via INTRAVENOUS

## 2018-12-11 MED ORDER — LIDOCAINE-EPINEPHRINE 1 %-1:100000 IJ SOLN
INTRAMUSCULAR | Status: AC
  Filled 2018-12-11: qty 1

## 2018-12-11 MED ORDER — KETAMINE HCL 10 MG/ML IJ SOLN
INTRAMUSCULAR | Status: DC | PRN
  Administered 2018-12-11: 20 mg via INTRAVENOUS

## 2018-12-11 MED ORDER — CEFAZOLIN SODIUM-DEXTROSE 2-4 GM/100ML-% IV SOLN
2.0000 g | INTRAVENOUS | Status: AC
  Administered 2018-12-11: 2 g via INTRAVENOUS
  Filled 2018-12-11: qty 100

## 2018-12-11 MED ORDER — PANTOPRAZOLE SODIUM 40 MG PO TBEC
40.0000 mg | DELAYED_RELEASE_TABLET | Freq: Every day | ORAL | Status: DC
  Administered 2018-12-12: 10:00:00 40 mg via ORAL
  Filled 2018-12-11: qty 1

## 2018-12-11 MED ORDER — SODIUM CHLORIDE 0.9% FLUSH
3.0000 mL | Freq: Two times a day (BID) | INTRAVENOUS | Status: DC
  Administered 2018-12-11 (×2): 3 mL via INTRAVENOUS

## 2018-12-11 MED ORDER — HYDROCODONE-ACETAMINOPHEN 5-325 MG PO TABS
1.0000 | ORAL_TABLET | ORAL | Status: DC | PRN
  Filled 2018-12-11: qty 1

## 2018-12-11 MED ORDER — FLEET ENEMA 7-19 GM/118ML RE ENEM: 1.0000 | ENEMA | Freq: Once | RECTAL | Status: DC | PRN

## 2018-12-11 MED ORDER — MIDAZOLAM HCL 2 MG/2ML IJ SOLN
INTRAMUSCULAR | Status: AC
  Filled 2018-12-11: qty 2

## 2018-12-11 MED ORDER — SODIUM CHLORIDE 0.9% FLUSH: 3.0000 mL | INTRAVENOUS | Status: DC | PRN

## 2018-12-11 MED ORDER — KETAMINE HCL 50 MG/5ML IJ SOSY
PREFILLED_SYRINGE | INTRAMUSCULAR | Status: AC
  Filled 2018-12-11: qty 5

## 2018-12-11 MED ORDER — POLYETHYLENE GLYCOL 3350 17 G PO PACK: 17.0000 g | PACK | Freq: Every day | ORAL | Status: DC | PRN

## 2018-12-11 MED ORDER — THROMBIN 5000 UNITS EX SOLR
OROMUCOSAL | Status: DC | PRN
  Administered 2018-12-11: 5 mL via TOPICAL

## 2018-12-11 MED ORDER — HYDROCHLOROTHIAZIDE 25 MG PO TABS
12.5000 mg | ORAL_TABLET | Freq: Every day | ORAL | Status: DC
  Administered 2018-12-12: 12.5 mg via ORAL
  Filled 2018-12-11: qty 1

## 2018-12-11 MED ORDER — MELATONIN 3 MG PO TABS
3.0000 mg | ORAL_TABLET | Freq: Every evening | ORAL | Status: DC | PRN
  Administered 2018-12-11: 23:00:00 3 mg via ORAL
  Filled 2018-12-11 (×2): qty 1

## 2018-12-11 MED ORDER — CHLORHEXIDINE GLUCONATE CLOTH 2 % EX PADS: 6.0000 | MEDICATED_PAD | Freq: Once | CUTANEOUS | Status: DC

## 2018-12-11 MED ORDER — OXYCODONE-ACETAMINOPHEN 5-325 MG PO TABS
1.0000 | ORAL_TABLET | Freq: Every day | ORAL | Status: DC
  Administered 2018-12-11: 1 via ORAL
  Filled 2018-12-11: qty 1

## 2018-12-11 MED ORDER — ACETAMINOPHEN 325 MG PO TABS: 650.0000 mg | ORAL_TABLET | ORAL | Status: DC | PRN

## 2018-12-11 MED ORDER — DEXAMETHASONE SODIUM PHOSPHATE 10 MG/ML IJ SOLN
INTRAMUSCULAR | Status: DC | PRN
  Administered 2018-12-11: 10 mg via INTRAVENOUS

## 2018-12-11 MED ORDER — OXYCODONE HCL 5 MG PO TABS: 5.0000 mg | ORAL_TABLET | Freq: Once | ORAL | Status: DC | PRN

## 2018-12-11 MED ORDER — LIDOCAINE 2% (20 MG/ML) 5 ML SYRINGE
INTRAMUSCULAR | Status: DC | PRN
  Administered 2018-12-11: 40 mg via INTRAVENOUS

## 2018-12-11 MED ORDER — VITAMIN B-12 1000 MCG PO TABS
5000.0000 ug | ORAL_TABLET | Freq: Every day | ORAL | Status: DC
  Administered 2018-12-12: 5000 ug via ORAL
  Filled 2018-12-11: qty 5

## 2018-12-11 MED ORDER — DULOXETINE HCL 30 MG PO CPEP
60.0000 mg | ORAL_CAPSULE | Freq: Every day | ORAL | Status: DC
  Administered 2018-12-11: 60 mg via ORAL
  Filled 2018-12-11: qty 2

## 2018-12-11 MED ORDER — PHENOL 1.4 % MT LIQD: 1.0000 | OROMUCOSAL | Status: DC | PRN

## 2018-12-11 MED ORDER — 0.9 % SODIUM CHLORIDE (POUR BTL) OPTIME
TOPICAL | Status: DC | PRN
  Administered 2018-12-11: 1000 mL

## 2018-12-11 MED ORDER — PROPOFOL 10 MG/ML IV BOLUS
INTRAVENOUS | Status: AC
  Filled 2018-12-11: qty 20

## 2018-12-11 MED ORDER — OXYCODONE-ACETAMINOPHEN 7.5-325 MG PO TABS: 1.0000 | ORAL_TABLET | Freq: Every day | ORAL | Status: DC

## 2018-12-11 MED ORDER — BUPIVACAINE HCL (PF) 0.5 % IJ SOLN
INTRAMUSCULAR | Status: AC
  Filled 2018-12-11: qty 30

## 2018-12-11 MED ORDER — LIDOCAINE 2% (20 MG/ML) 5 ML SYRINGE
INTRAMUSCULAR | Status: AC
  Filled 2018-12-11: qty 5

## 2018-12-11 MED ORDER — FENTANYL CITRATE (PF) 100 MCG/2ML IJ SOLN
INTRAMUSCULAR | Status: DC | PRN
  Administered 2018-12-11 (×2): 75 ug via INTRAVENOUS
  Administered 2018-12-11: 50 ug via INTRAVENOUS

## 2018-12-11 MED ORDER — MORPHINE SULFATE (PF) 2 MG/ML IV SOLN
2.0000 mg | INTRAVENOUS | Status: DC | PRN
  Administered 2018-12-11 – 2018-12-12 (×3): 2 mg via INTRAVENOUS
  Filled 2018-12-11 (×3): qty 1

## 2018-12-11 MED ORDER — HYDROCODONE-ACETAMINOPHEN 5-325 MG PO TABS
2.0000 | ORAL_TABLET | ORAL | Status: DC | PRN
  Administered 2018-12-11: 2 via ORAL
  Filled 2018-12-11: qty 2

## 2018-12-11 MED ORDER — OXYCODONE-ACETAMINOPHEN 5-325 MG PO TABS
1.0000 | ORAL_TABLET | ORAL | Status: DC | PRN
  Administered 2018-12-11 – 2018-12-12 (×4): 2 via ORAL
  Filled 2018-12-11 (×4): qty 2

## 2018-12-11 MED ORDER — BISACODYL 10 MG RE SUPP: 10.0000 mg | Freq: Every day | RECTAL | Status: DC | PRN

## 2018-12-11 MED ORDER — METHOCARBAMOL 500 MG PO TABS
500.0000 mg | ORAL_TABLET | Freq: Four times a day (QID) | ORAL | Status: DC | PRN
  Administered 2018-12-11 – 2018-12-12 (×3): 500 mg via ORAL
  Filled 2018-12-11 (×3): qty 1

## 2018-12-11 MED ORDER — SODIUM CHLORIDE 0.9 % IV SOLN
INTRAVENOUS | Status: DC | PRN
  Administered 2018-12-11: 10 ug/min via INTRAVENOUS

## 2018-12-11 MED ORDER — FENTANYL CITRATE (PF) 100 MCG/2ML IJ SOLN
INTRAMUSCULAR | Status: AC
  Filled 2018-12-11: qty 2

## 2018-12-11 MED ORDER — ONDANSETRON HCL 4 MG PO TABS: 4.0000 mg | ORAL_TABLET | Freq: Four times a day (QID) | ORAL | Status: DC | PRN

## 2018-12-11 SURGICAL SUPPLY — 100 items
ADH SKN CLS APL DERMABOND .7 (GAUZE/BANDAGES/DRESSINGS) ×1
APPLIER CLIP 11 MED OPEN (CLIP) ×2
APR CLP MED 11 20 MLT OPN (CLIP) ×1
BASKET BONE COLLECTION (BASKET) IMPLANT
BOLT BASE TI 5X17.5 VARIABLE (Bolt) ×2 IMPLANT
BOLT BASE TI 5X20 VARIABLE (Bolt) ×1 IMPLANT
BUR BARREL STRAIGHT FLUTE 4.0 (BURR) ×1 IMPLANT
CANISTER SUCT 3000ML PPV (MISCELLANEOUS) ×2 IMPLANT
CLIP APPLIE 11 MED OPEN (CLIP) ×2 IMPLANT
CLIP VESOCCLUDE MED 24/CT (CLIP) ×1 IMPLANT
CLIP VESOCCLUDE SM WIDE 24/CT (CLIP) ×1 IMPLANT
COVER BACK TABLE 60X90IN (DRAPES) ×3 IMPLANT
COVER WAND RF STERILE (DRAPES) ×3 IMPLANT
DECANTER SPIKE VIAL GLASS SM (MISCELLANEOUS) ×2 IMPLANT
DERMABOND ADVANCED (GAUZE/BANDAGES/DRESSINGS) ×1
DERMABOND ADVANCED .7 DNX12 (GAUZE/BANDAGES/DRESSINGS) ×2 IMPLANT
DRAPE C-ARM 42X72 X-RAY (DRAPES) ×2 IMPLANT
DRAPE C-ARMOR (DRAPES) ×2 IMPLANT
DRAPE INCISE IOBAN 66X45 STRL (DRAPES) ×2 IMPLANT
DRAPE LAPAROTOMY 100X72X124 (DRAPES) ×2 IMPLANT
DRSG OPSITE POSTOP 4X8 (GAUZE/BANDAGES/DRESSINGS) ×1 IMPLANT
DURAPREP 26ML APPLICATOR (WOUND CARE) ×2 IMPLANT
ELECT BLADE 4.0 EZ CLEAN MEGAD (MISCELLANEOUS)
ELECT BLADE 6.5 EXT (BLADE) ×1 IMPLANT
ELECT REM PT RETURN 9FT ADLT (ELECTROSURGICAL) ×2
ELECTRODE BLDE 4.0 EZ CLN MEGD (MISCELLANEOUS) IMPLANT
ELECTRODE REM PT RTRN 9FT ADLT (ELECTROSURGICAL) ×1 IMPLANT
GAUZE 4X4 16PLY RFD (DISPOSABLE) IMPLANT
GAUZE SPONGE 4X4 12PLY STRL (GAUZE/BANDAGES/DRESSINGS) IMPLANT
GLOVE BIO SURGEON STRL SZ8 (GLOVE) ×2 IMPLANT
GLOVE BIOGEL PI IND STRL 7.5 (GLOVE) ×1 IMPLANT
GLOVE BIOGEL PI IND STRL 8 (GLOVE) ×1 IMPLANT
GLOVE BIOGEL PI IND STRL 8.5 (GLOVE) ×1 IMPLANT
GLOVE BIOGEL PI INDICATOR 7.5 (GLOVE) ×4
GLOVE BIOGEL PI INDICATOR 8 (GLOVE) ×1
GLOVE BIOGEL PI INDICATOR 8.5 (GLOVE) ×1
GLOVE ECLIPSE 7.5 STRL STRAW (GLOVE) ×2 IMPLANT
GLOVE ECLIPSE 8.0 STRL XLNG CF (GLOVE) ×2 IMPLANT
GLOVE EXAM NITRILE XL STR (GLOVE) IMPLANT
GLOVE SURG SS PI 7.5 STRL IVOR (GLOVE) ×2 IMPLANT
GOWN STRL REUS W/ TWL LRG LVL3 (GOWN DISPOSABLE) ×2 IMPLANT
GOWN STRL REUS W/ TWL XL LVL3 (GOWN DISPOSABLE) ×2 IMPLANT
GOWN STRL REUS W/TWL 2XL LVL3 (GOWN DISPOSABLE) ×3 IMPLANT
GOWN STRL REUS W/TWL LRG LVL3 (GOWN DISPOSABLE) ×2
GOWN STRL REUS W/TWL XL LVL3 (GOWN DISPOSABLE) ×2
HEMOSTAT POWDER SURGIFOAM 1G (HEMOSTASIS) ×1 IMPLANT
IMPL BASE TI 8X34X24MM 15DG (Cage) IMPLANT
IMPLANT BASE TI 8X34X24MM 15DG (Cage) ×2 IMPLANT
INSERT FOGARTY 61MM (MISCELLANEOUS) IMPLANT
INSERT FOGARTY SM (MISCELLANEOUS) IMPLANT
KIT BASIN OR (CUSTOM PROCEDURE TRAY) ×2 IMPLANT
KIT INFUSE XX SMALL 0.7CC (Orthopedic Implant) ×1 IMPLANT
KIT TURNOVER KIT B (KITS) ×4 IMPLANT
LOOP VESSEL MAXI BLUE (MISCELLANEOUS) ×1 IMPLANT
LOOP VESSEL MINI RED (MISCELLANEOUS) ×1 IMPLANT
NDL HYPO 25X1 1.5 SAFETY (NEEDLE) IMPLANT
NDL SPNL 18GX3.5 QUINCKE PK (NEEDLE) ×1 IMPLANT
NEEDLE HYPO 25X1 1.5 SAFETY (NEEDLE) ×2 IMPLANT
NEEDLE SPNL 18GX3.5 QUINCKE PK (NEEDLE) IMPLANT
NS IRRIG 1000ML POUR BTL (IV SOLUTION) ×2 IMPLANT
PACK LAMINECTOMY NEURO (CUSTOM PROCEDURE TRAY) ×2 IMPLANT
PAD ARMBOARD 7.5X6 YLW CONV (MISCELLANEOUS) ×4 IMPLANT
PUTTY BONE ATTRAX 10CC STRIP (Putty) ×1 IMPLANT
SPONGE INTESTINAL PEANUT (DISPOSABLE) ×1 IMPLANT
SPONGE LAP 18X18 RF (DISPOSABLE) ×2 IMPLANT
SPONGE LAP 4X18 RFD (DISPOSABLE) IMPLANT
SPONGE SURGIFOAM ABS GEL 100 (HEMOSTASIS) IMPLANT
SPONGE SURGIFOAM ABS GEL SZ50 (HEMOSTASIS) ×2 IMPLANT
STAPLER VISISTAT 35W (STAPLE) IMPLANT
SUT MNCRL AB 4-0 PS2 18 (SUTURE) ×1 IMPLANT
SUT PDS AB 1 CTX 36 (SUTURE) ×3 IMPLANT
SUT PROLENE 4 0 RB 1 (SUTURE)
SUT PROLENE 4-0 RB1 .5 CRCL 36 (SUTURE) ×4 IMPLANT
SUT PROLENE 5 0 CC1 (SUTURE) IMPLANT
SUT PROLENE 6 0 C 1 30 (SUTURE) ×1 IMPLANT
SUT PROLENE 6 0 CC (SUTURE) IMPLANT
SUT SILK 0 TIES 10X30 (SUTURE) ×1 IMPLANT
SUT SILK 2 0 TIES 10X30 (SUTURE) ×2 IMPLANT
SUT SILK 2 0 TIES 17X18 (SUTURE) ×2
SUT SILK 2 0SH CR/8 30 (SUTURE) IMPLANT
SUT SILK 2-0 18XBRD TIE BLK (SUTURE) ×1 IMPLANT
SUT SILK 3 0 TIES 10X30 (SUTURE) ×2 IMPLANT
SUT SILK 3 0SH CR/8 30 (SUTURE) IMPLANT
SUT VIC AB 0 CT1 27 (SUTURE)
SUT VIC AB 0 CT1 27XBRD ANBCTR (SUTURE) ×1 IMPLANT
SUT VIC AB 1 CT1 18XBRD ANBCTR (SUTURE) IMPLANT
SUT VIC AB 1 CT1 8-18 (SUTURE)
SUT VIC AB 2-0 CT1 18 (SUTURE) ×2 IMPLANT
SUT VIC AB 2-0 CT1 27 (SUTURE)
SUT VIC AB 2-0 CT1 36 (SUTURE) ×2 IMPLANT
SUT VIC AB 2-0 CT1 TAPERPNT 27 (SUTURE) ×1 IMPLANT
SUT VIC AB 2-0 CTX 36 (SUTURE) IMPLANT
SUT VIC AB 3-0 SH 27 (SUTURE)
SUT VIC AB 3-0 SH 27X BRD (SUTURE) ×2 IMPLANT
SUT VIC AB 3-0 SH 8-18 (SUTURE) ×1 IMPLANT
SUT VICRYL 4-0 PS2 18IN ABS (SUTURE) IMPLANT
TOWEL GREEN STERILE (TOWEL DISPOSABLE) ×4 IMPLANT
TOWEL GREEN STERILE FF (TOWEL DISPOSABLE) ×2 IMPLANT
TRAY FOLEY MTR SLVR 16FR STAT (SET/KITS/TRAYS/PACK) ×2 IMPLANT
WATER STERILE IRR 1000ML POUR (IV SOLUTION) ×2 IMPLANT

## 2018-12-11 NOTE — Progress Notes (Signed)
assessment done at0700

## 2018-12-11 NOTE — Op Note (Signed)
    Patient name: DEYNA CARBON MRN: [id number] DOB: [birth year] Sex: female  12/11/2018 Pre-operative Diagnosis: Degenerative Back disease Post-operative diagnosis:  Same Surgeon:  Annamarie Major Co -Surgeon:  Dierdre Harness, MD Procedure:   Anterior exposure L5-S1  Anesthesia: General Blood Loss:  minimal Specimens:  none  Findings:  Normal anatomy, moderate amount of scar tissue from prior surgery  Indications: Anterior exposure for L5-S1 instrumentation was recommended by Dr. Vertell Limber.  I have been asked to provide anterior exposure.  Procedure:  The patient was identified in the holding area and taken to [place] [facility]  The patient was then placed supine on the table. general anesthesia was administered.  The patient was prepped and draped in the usual sterile fashion.  A time out was called and antibiotics were administered.  Fluoroscopy was used to identify the appropriate level of the skin incision.  A left lower quadrant transverse incision was made.  Cautery is used about subtenons tissue down to the abdominal wall fascia.  Fascia was opened with cautery.  Subfascial flaps were raised.  It was difficult to identify the midline and so I entered the retroperitoneum laterally.  The retroperitoneum had a fair amount of scar tissue from her prior surgery.  I first identified the iliac artery and vein and traced these superiorly.  Identified the ureter and reflected it laterally.  I then encountered the L5-S1 disc space.  This was bluntly dissected free.  The median sacral vessels were ligated between 2-0 silk ties.  I then set up the retractor system and placed 160 blade on the right lateral side of the spine and a 160 blade inferiorly.  I then proceeded with further mobilization superiorly and on the left lateral side to protect the vein.  Ultimately I was able to get a 160 retractor on the left lateral side and a 140 retractor on the superior side.  A needle was then placed in the midline and  fluoroscopy was used to confirm that we are at the appropriate location.  At this point.Dr. Vertell Limber came in and performed this portion of the procedure.  Please see his detailed operative note.  There were no complications.   Disposition: To PACU stable.   Theotis Burrow, M.D., [facility] [place] Office: [phone] Pager:  [phone]

## 2018-12-11 NOTE — Op Note (Signed)
12/11/2018  10:18 AM  PATIENT:  Jacqueline Thornton  68 y.o. female  PRE-OPERATIVE DIAGNOSIS:  Spondylolisthesis, Lumbosacral region, lumbar foraminal stenosis, DDD, radiculopathy, lumbago L 5 S  1 level  POST-OPERATIVE DIAGNOSIS:  Spondylolisthesis, Lumbosacral region, lumbar foraminal stenosis, DDD, radiculopathy, lumbago L 5 S  1 level   PROCEDURE:  Procedure(s): Lumbar five Sacral one Anterior lumbar interbody fusion (N/A) ABDOMINAL EXPOSURE (N/A) with titanium cage, allograft, screw fixation  SURGEON:  Surgeon(s) and Role: Panel 1:    Erline Levine, MD - Primary Panel 2:    * Serafina Mitchell, MD - Primary  PHYSICIAN ASSISTANT:   ASSISTANTS: Poteat, RN   ANESTHESIA:   general  EBL:  50 mL   BLOOD ADMINISTERED:none  DRAINS: none   LOCAL MEDICATIONS USED:  MARCAINE    and LIDOCAINE   SPECIMEN:  No Specimen  DISPOSITION OF SPECIMEN:  N/A  COUNTS:  YES  TOURNIQUET:  * No tourniquets in log *  DICTATION:  INDICATIONS:  [name] is 68 year old female with chronic and intractable back and bilateral lower extremity pain with marked disc degeneration and spondylosis with spondylolisthesis at the L 5 S 1 level.   It was elected to take her to surgery for anterior lumbar decompression and fusion at the L 5 S1 level.  PROCEDURE:  Doctor Trula Slade performed exposure and his portion of the procedure will be dictated separately.  Upon exposing the L 5 S1 level, a localizing X ray was obtained with the C arm.  I then incised the anterior annulus and performed a thorough discectomy.  The endplates were cleared of disc and cartilagenous material and a thorough discectomy was performed with decompression of the ventral annulus and disc material.  After trial, a 15 degree lordotic 8 x 34 x 24 mm Nuvasive Base titanium spacer was selected, packed with extra extra small BMP and Attrax allograft material.  The implant was tamped into position and positioning was confirmed with C arm.  Interfixated screws were placed (2 at S 1; 5.0 x 17.5 mm) and one at L 5 (5.0 x 20 mm).  Retractors were removed.  Fascia was closed with 1 PDS running stitch, skin edges closed with 2-0 and 3-0 vicryl sutures.  Wound was dressed with a sterile occlusive dressing.  Patient was extubated in the OR and taken to recovery having tolerated her surgery well.  Counts were correct.   PLAN OF CARE: Admit to inpatient   PATIENT DISPOSITION:  PACU - hemodynamically stable.   Delay start of Pharmacological VTE agent (>24hrs) due to surgical blood loss or risk of bleeding: yes

## 2018-12-11 NOTE — Interval H&P Note (Signed)
History and Physical Interval Note:  12/11/2018 7:30 AM  Jacqueline Thornton  has presented today for surgery, with the diagnosis of Spondylolisthesis, Lumbosacral region.  The various methods of treatment have been discussed with the patient and family. After consideration of risks, benefits and other options for treatment, the patient has consented to  Procedure(s) with comments: Lumbar 5 Sacral 1 Anterior lumbar interbody fusion (N/A) - Lumbar 5 Sacral 1 Anterior lumbar interbody fusion ABDOMINAL EXPOSURE (N/A) as a surgical intervention.  The patient's history has been reviewed, patient examined, no change in status, stable for surgery.  I have reviewed the patient's chart and labs.  Questions were answered to the patient's satisfaction.     Annamarie Major

## 2018-12-11 NOTE — Anesthesia Procedure Notes (Signed)
Procedure Name: Intubation Date/Time: 12/11/2018 7:46 AM Performed by: Orlie Dakin, CRNA Pre-anesthesia Checklist: Patient identified, Emergency Drugs available, Suction available and Patient being monitored Patient Re-evaluated:Patient Re-evaluated prior to induction Oxygen Delivery Method: Circle system utilized Preoxygenation: Pre-oxygenation with 100% oxygen Induction Type: IV induction and Rapid sequence Laryngoscope Size: Miller and 3 Grade View: Grade I Tube type: Oral Tube size: 7.0 mm Number of attempts: 1 Airway Equipment and Method: Stylet Placement Confirmation: ETT inserted through vocal cords under direct vision,  positive ETCO2 and breath sounds checked- equal and bilateral Secured at: 22 cm Tube secured with: Tape Dental Injury: Teeth and Oropharynx as per pre-operative assessment  Comments: Noted FROM of neck in Short-Stay.  RSI due to Covid-19 pandemic concerns.  Minimal neck extension with DL.  Noted dental "post implants" upper front.

## 2018-12-11 NOTE — Care Management (Signed)
Case manager received call from patient's son Jacqueline Thornton, He requests that he be notified when patient discharge plan is made and to contact him to pick up patient. His number is [phone]. Per [name], his dad is a bit forgetful so he needs the information. CM informed him that bedside RN will be given this information. Patient has all necessary DME at the home. Case manager will continue to follow.    Ricki Miller, RN BSN Case Manager [phone]

## 2018-12-11 NOTE — Transfer of Care (Signed)
Immediate Anesthesia Transfer of Care Note  Patient: Jacqueline Thornton  Procedure(s) Performed: Lumbar five Sacral one Anterior lumbar interbody fusion (N/A ) ABDOMINAL EXPOSURE (N/A )  Patient Location: PACU  Anesthesia Type:General  Level of Consciousness: awake and patient cooperative  Airway & Oxygen Therapy: Patient Spontanous Breathing and Patient connected to nasal cannula oxygen  Post-op Assessment: Report given to RN and Post -op Vital signs reviewed and stable  Post vital signs: Reviewed and stable  Last Vitals:  Vitals Value Taken Time  BP 136/66 12/11/18 1023  Temp    Pulse 102 12/11/18 1024  Resp 15 12/11/18 1024  SpO2 100 % 12/11/18 1024  Vitals shown include unvalidated device data.  Last Pain:  Vitals:   12/11/18 0555  TempSrc: Oral  PainSc: 3          Complications: No apparent anesthesia complications

## 2018-12-11 NOTE — Brief Op Note (Signed)
12/11/2018  10:18 AM  PATIENT:  Jacqueline Thornton  68 y.o. female  PRE-OPERATIVE DIAGNOSIS:  Spondylolisthesis, Lumbosacral region, lumbar foraminal stenosis, DDD, radiculopathy, lumbago L 5 S  1 level  POST-OPERATIVE DIAGNOSIS:  Spondylolisthesis, Lumbosacral region, lumbar foraminal stenosis, DDD, radiculopathy, lumbago L 5 S  1 level   PROCEDURE:  Procedure(s): Lumbar five Sacral one Anterior lumbar interbody fusion (N/A) ABDOMINAL EXPOSURE (N/A) with titanium cage, allograft, screw fixation  SURGEON:  Surgeon(s) and Role: Panel 1:    Erline Levine, MD - Primary Panel 2:    * Serafina Mitchell, MD - Primary  PHYSICIAN ASSISTANT:   ASSISTANTS: Poteat, RN   ANESTHESIA:   general  EBL:  50 mL   BLOOD ADMINISTERED:none  DRAINS: none   LOCAL MEDICATIONS USED:  MARCAINE    and LIDOCAINE   SPECIMEN:  No Specimen  DISPOSITION OF SPECIMEN:  N/A  COUNTS:  YES  TOURNIQUET:  * No tourniquets in log *  DICTATION:  INDICATIONS:  [name] is 68 year old female with chronic and intractable back and bilateral lower extremity pain with marked disc degeneration and spondylosis with spondylolisthesis at the L 5 S 1 level.   It was elected to take her to surgery for anterior lumbar decompression and fusion at the L 5 S1 level.  PROCEDURE:  Doctor Trula Slade performed exposure and his portion of the procedure will be dictated separately.  Upon exposing the L 5 S1 level, a localizing X ray was obtained with the C arm.  I then incised the anterior annulus and performed a thorough discectomy.  The endplates were cleared of disc and cartilagenous material and a thorough discectomy was performed with decompression of the ventral annulus and disc material.  After trial, a 15 degree lordotic 8 x 34 x 24 mm Nuvasive Base titanium spacer was selected, packed with extra extra small BMP and Attrax allograft material.  The implant was tamped into position and positioning was confirmed with C arm.  Interfixated screws were placed (2 at S 1; 5.0 x 17.5 mm) and one at L 5 (5.0 x 20 mm).  Retractors were removed.  Fascia was closed with 1 PDS running stitch, skin edges closed with 2-0 and 3-0 vicryl sutures.  Wound was dressed with a sterile occlusive dressing.  Patient was extubated in the OR and taken to recovery having tolerated her surgery well.  Counts were correct.   PLAN OF CARE: Admit to inpatient   PATIENT DISPOSITION:  PACU - hemodynamically stable.   Delay start of Pharmacological VTE agent (>24hrs) due to surgical blood loss or risk of bleeding: yes

## 2018-12-11 NOTE — Anesthesia Postprocedure Evaluation (Signed)
Anesthesia Post Note  Patient: Jacqueline Thornton  Procedure(s) Performed: Lumbar five Sacral one Anterior lumbar interbody fusion (N/A ) ABDOMINAL EXPOSURE (N/A )     Patient location during evaluation: PACU Anesthesia Type: General Level of consciousness: awake and alert Pain management: pain level controlled Vital Signs Assessment: post-procedure vital signs reviewed and stable Respiratory status: spontaneous breathing, nonlabored ventilation, respiratory function stable and patient connected to nasal cannula oxygen Cardiovascular status: blood pressure returned to baseline and stable Postop Assessment: no apparent nausea or vomiting Anesthetic complications: no    Last Vitals:  Vitals:   12/11/18 1150 12/11/18 1633  BP: 115/76 (!) 115/55  Pulse: (!) 103 97  Resp:  18  Temp: 36.8 C 36.6 C  SpO2: 96% 97%    Last Pain:  Vitals:   12/11/18 1633  TempSrc: Oral  PainSc:                  Stevens Magwood COKER

## 2018-12-11 NOTE — Interval H&P Note (Signed)
History and Physical Interval Note:  12/11/2018 7:36 AM  Jacqueline Thornton  has presented today for surgery, with the diagnosis of Spondylolisthesis, Lumbosacral region.  The various methods of treatment have been discussed with the patient and family. After consideration of risks, benefits and other options for treatment, the patient has consented to  Procedure(s) with comments: Lumbar 5 Sacral 1 Anterior lumbar interbody fusion (N/A) - Lumbar 5 Sacral 1 Anterior lumbar interbody fusion ABDOMINAL EXPOSURE (N/A) as a surgical intervention.  The patient's history has been reviewed, patient examined, no change in status, stable for surgery.  I have reviewed the patient's chart and labs.  Questions were answered to the patient's satisfaction.     [name]

## 2018-12-11 NOTE — Progress Notes (Signed)
Awake, alert, conversant.  Full strength bilateral lower extremities.  Patient is doing well.

## 2018-12-11 NOTE — Progress Notes (Signed)
Patient ID: Jacqueline Thornton, female   DOB: [birth year], 68 y.o.   MRN: [id number] Alert, conversant. Reports no leg pain as before surgery, noting mild left thigh numbness. Lumbar pain as expected. Reassured. Mobilize in LSO.

## 2018-12-12 MED ORDER — METHOCARBAMOL 500 MG PO TABS: 500.0000 mg | ORAL_TABLET | Freq: Four times a day (QID) | ORAL | 1 refills | Status: DC | PRN

## 2018-12-12 NOTE — Evaluation (Signed)
Physical Therapy Evaluation Patient Details Name: Jacqueline Thornton MRN: [id number] DOB: 25-Feb-1951 Today's Date: 12/12/2018   History of Present Illness  68 yo admitted for L5-S1 ALIF. PMHx: DDD, gout, HLD, panic attacks, anxiety  Clinical Impression  Pt very pleasant and reports increasing LLE pain and weakness for several months. Pt educated for all precautions with handout provided, brace wear and LLE exercises for strengthening and balance. Pt will benefit from RW for home use for stability until LLE strength and endurance improves. Pt with decreased strength and activity tolerance who should be able to return to normal function with education provided for HEP and walking program. Pt aware and agreeable and encouraged to discuss OPPT with MD should recovery be prolonged.     Follow Up Recommendations No PT follow up    Equipment Recommendations  Rolling walker with 5" wheels    Recommendations for Other Services       Precautions / Restrictions Precautions Precautions: Back Precaution Booklet Issued: Yes (comment) Required Braces or Orthoses: Spinal Brace Spinal Brace: Lumbar corset;Applied in sitting position      Mobility  Bed Mobility Overal bed mobility: Modified Independent             General bed mobility comments: pt cued for sequence and able to return demonstrate  Transfers Overall transfer level: Needs assistance   Transfers: Sit to/from Stand Sit to Stand: Min guard         General transfer comment: guarding for safety as pt reports LLE weakness and instability  Ambulation/Gait Ambulation/Gait assistance: Supervision Gait Distance (Feet): 400 Feet Assistive device: Rolling walker (2 wheeled) Gait Pattern/deviations: Step-through pattern;Decreased stance time - left   Gait velocity interpretation: >2.62 ft/sec, indicative of community ambulatory General Gait Details: pt with steady gait with LLE with noted weakness in LLE with reliance on RW for  safety and stability. cues for posture x 2  Stairs Stairs: Yes Stairs assistance: Modified independent (Device/Increase time) Stair Management: Two rails;Alternating pattern;Forwards Number of Stairs: 2 General stair comments: good safety and ability with use of rail  Wheelchair Mobility    Modified Rankin (Stroke Patients Only)       Balance Overall balance assessment: Needs assistance   Sitting balance-Leahy Scale: Good       Standing balance-Leahy Scale: Fair Standing balance comment: able to stand without Assist but needs UE support due to LLE weakness                             Pertinent Vitals/Pain Pain Assessment: 0-10 Pain Score: 3  Pain Location: left leg and incision Pain Descriptors / Indicators: Aching;Sore Pain Intervention(s): Limited activity within patient's tolerance;Repositioned    Home Living Family/patient expects to be discharged to:: Private residence Living Arrangements: Spouse/significant other Available Help at Discharge: Family;Available 24 hours/day Type of Home: House Home Access: Stairs to enter Entrance Stairs-Rails: Psychiatric nurse of Steps: 2 Home Layout: One level Home Equipment: None      Prior Function Level of Independence: Independent               Hand Dominance        Extremity/Trunk Assessment   Upper Extremity Assessment Upper Extremity Assessment: Overall WFL for tasks assessed    Lower Extremity Assessment Lower Extremity Assessment: LLE deficits/detail LLE Deficits / Details: dorsiflexion 2/5, knee extension 3/5, hip flexion 5/5 LLE Sensation: WNL    Cervical / Trunk Assessment Cervical / Trunk Assessment: Other  exceptions(post surgical)  Communication   Communication: No difficulties  Cognition Arousal/Alertness: Awake/alert Behavior During Therapy: WFL for tasks assessed/performed Overall Cognitive Status: Within Functional Limits for tasks assessed                                         General Comments      Exercises General Exercises - Lower Extremity Ankle Circles/Pumps: AROM;Left;5 reps;Seated Long Arc Quad: AROM;Both;10 reps;Seated   Assessment/Plan    PT Assessment Patent does not need any further PT services  PT Problem List         PT Treatment Interventions      PT Goals (Current goals can be found in the Care Plan section)  Acute Rehab PT Goals PT Goal Formulation: All assessment and education complete, DC therapy    Frequency     Barriers to discharge        Co-evaluation               AM-PAC PT "6 Clicks" Mobility  Outcome Measure Help needed turning from your back to your side while in a flat bed without using bedrails?: A Little Help needed moving from lying on your back to sitting on the side of a flat bed without using bedrails?: A Little Help needed moving to and from a bed to a chair (including a wheelchair)?: A Little Help needed standing up from a chair using your arms (e.g., wheelchair or bedside chair)?: A Little Help needed to walk in hospital room?: A Little Help needed climbing 3-5 steps with a railing? : None 6 Click Score: 19    End of Session Equipment Utilized During Treatment: Back brace Activity Tolerance: Patient tolerated treatment well Patient left: in chair;with call bell/phone within reach Nurse Communication: Mobility status PT Visit Diagnosis: Muscle weakness (generalized) (M62.81);Other abnormalities of gait and mobility (R26.89)    Time: 7673-4193 PT Time Calculation (min) (ACUTE ONLY): 26 min   Charges:   PT Evaluation $PT Eval Moderate Complexity: 1 Mod PT Treatments $Gait Training: 8-22 mins        Seylah Wernert Pam Drown, PT Acute Rehabilitation Services Pager: [phone] Office: [place] 12/12/2018, 12:19 PM

## 2018-12-12 NOTE — Discharge Summary (Signed)
Physician Discharge Summary  Patient ID: Jacqueline Thornton MRN: [id number] DOB/AGE: 67-Jul-1952 68 y.o.  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  She was mobilized on POD 1 and was discharged home having progressed nicely.  Consults: None  Significant Diagnostic Studies: None  Treatments: surgery: Patient underwent L 5 S 1 ALIF,  Discharge Exam: Blood pressure 112/69, pulse (!) 101, temperature 98.3 F (36.8 C), temperature source Oral, resp. rate 18, SpO2 93 %. Neurologic: Alert and oriented X 3, normal strength and tone. Normal symmetric reflexes. Normal coordination and gait Wound:CDI  Disposition: Home  Discharge Instructions    Diet - low sodium heart healthy   Complete by: As directed    Increase activity slowly   Complete by: As directed      Allergies as of 12/12/2018   No Known Allergies     Medication List    STOP taking these medications   meloxicam 15 MG tablet Commonly known as: MOBIC     TAKE these medications   cyclobenzaprine 10 MG tablet Commonly known as: FLEXERIL Take 1 tablet (10 mg total) by mouth 3 (three) times daily as needed for muscle spasms.   DULoxetine 60 MG capsule Commonly known as: CYMBALTA Take 60 mg by mouth at bedtime.   enalapril 5 MG tablet Commonly known as: VASOTEC Take 5 mg by mouth daily.   esomeprazole 20 MG capsule Commonly known as: NEXIUM Take 20 mg by mouth at bedtime.   estradiol 0.5 MG tablet Commonly known as: ESTRACE Take 0.5 mg by mouth at bedtime.   gabapentin 300  MG capsule Commonly known as: NEURONTIN Take 1 capsule (300 mg total) by mouth 2 (two) times daily. Start taking on: December 21, 2018   hydrochlorothiazide 12.5 MG tablet Commonly known as: HYDRODIURIL Take 12.5 mg by mouth daily.   Melatonin 3 MG Tabs Take 3 mg by mouth at bedtime as needed (for sleep).   methocarbamol 500 MG tablet Commonly known as: ROBAXIN Take 1 tablet (500 mg total) by mouth every 6 (six) hours as needed for muscle spasms.   oxyCODONE-acetaminophen 7.5-325 MG tablet Commonly known as: Percocet Take 1 tablet by mouth 5 (five) times daily for 30 days. Must last 30 days Start taking on: December 23, 2018   oxyCODONE-acetaminophen 7.5-325 MG tablet Commonly known as: Percocet Take 1 tablet by mouth 5 (five) times daily for 30 days. Must last 30 days Start taking on: January 22, 2019   oxyCODONE-acetaminophen 7.5-325 MG tablet Commonly known as: Percocet Take 1 tablet by mouth 5 (five) times daily for 30 days. Must last 30 days Start taking on: February 21, 2019   rosuvastatin 5 MG tablet Commonly known as: CRESTOR Take 5 mg by mouth at bedtime.   Vitamin B-12 5000 MCG Subl Place 1 tablet (5,000 mcg total) under the tongue daily. Start taking on: December 23, 2018   Vitamin D3 1.25 MG (50000 UT) Caps Take 50,000 Units by mouth every Wednesday.        Signed: [name], MD 12/12/2018, 6:43 AM

## 2018-12-12 NOTE — Progress Notes (Signed)
Discharge instructions/education/AVS/Rx given to patient and verbalized understanding. No drainage, no swelling, no redness noted on incision site. PAin is well controlled by PRN medications. Ambulating well with walker. Voiding well. Eating well with no complaints. Patient awaiting for transport home.

## 2018-12-12 NOTE — Discharge Instructions (Signed)

## 2018-12-12 NOTE — Evaluation (Signed)
Occupational Therapy Evaluation Patient Details Name: Jacqueline Thornton MRN: [id number] DOB: 08-26-1950 Today's Date: 12/12/2018    History of Present Illness 68 yo admitted for L5-S1 ALIF. PMHx: DDD, gout, HLD, panic attacks, anxiety   Clinical Impression   PTA patient independent. Admitted for above and limited by problem list below, including: pain, decreased activity tolerance, and impaired balance. Educated on brace mgmt and wear schedule, precautions, ADL compensatory techniques, safety, recommendations and mobility.  She is able to complete transfers with min guard fading to close supervision using RW, UB ADls with setup assist, LB ADLs with min assist (but reports spouse will assist), and grooming at sink with supervision.  Requires intermittent cueing for spinal precautions during self care tasks.  Patient plans to dc home with family support today, based on performance no further OT needs identified.  Thank you for this referral, OT signing off.     Follow Up Recommendations  No OT follow up;Supervision - Intermittent    Equipment Recommendations  None recommended by OT    Recommendations for Other Services       Precautions / Restrictions Precautions Precautions: Back Precaution Booklet Issued: Yes (comment) Required Braces or Orthoses: Spinal Brace Spinal Brace: Lumbar corset;Applied in sitting position(on upon entry)      Mobility Bed Mobility Overal bed mobility: Modified Independent             General bed mobility comments: OOB upon entry  Transfers Overall transfer level: Needs assistance Equipment used: Rolling walker (2 wheeled) Transfers: Sit to/from Stand Sit to Stand: Min guard;Supervision         General transfer comment: min guard fading to supervision, cueing for hand placement and safety    Balance Overall balance assessment: Needs assistance   Sitting balance-Leahy Scale: Good       Standing balance-Leahy Scale: Fair Standing  balance comment: preference to UE support dynamically                            ADL either performed or assessed with clinical judgement   ADL Overall ADL's : Needs assistance/impaired     Grooming: Supervision/safety;Standing;Cueing for compensatory techniques Grooming Details (indicate cue type and reason): cueing for techniques and back precautions  Upper Body Bathing: Set up;Sitting   Lower Body Bathing: Minimal assistance;Sit to/from stand Lower Body Bathing Details (indicate cue type and reason): reviewed techniques with bathing seated, brace mgmt, and long sponge  Upper Body Dressing : Set up;Sitting   Lower Body Dressing: Minimal assistance;Sit to/from stand;Cueing for back precautions;Cueing for compensatory techniques Lower Body Dressing Details (indicate cue type and reason): supervision sit<>stand, unable to compelte figure 4 techinque and reports spouse can assist, reviewed compensatory techniques and safety Toilet Transfer: Supervision/safety;Ambulation;RW   Toileting- Clothing Manipulation and Hygiene: Supervision/safety;Sit to/from stand   Tub/ Shower Transfer: Walk-in shower;Min guard;Ambulation;Rolling walker;Shower Scientist, research (medical) Details (indicate cue type and reason): reviewed reverse step transfer over threshold with RW to shower chair, min guard for safety and reports spouse can assist  Functional mobility during ADLs: Supervision/safety;Rolling walker General ADL Comments: educated on compensatory techniques, safety, precautions and ADLs      Vision         Perception     Praxis      Pertinent Vitals/Pain Pain Assessment: Faces Pain Score: 3  Faces Pain Scale: Hurts little more Pain Location: left leg and incision Pain Descriptors / Indicators: Aching;Sore Pain Intervention(s): Monitored during session;Limited activity within  patient's tolerance;Repositioned     Hand Dominance     Extremity/Trunk Assessment Upper Extremity  Assessment Upper Extremity Assessment: Overall WFL for tasks assessed   Lower Extremity Assessment Lower Extremity Assessment: Defer to PT evaluation LLE Deficits / Details: dorsiflexion 2/5, knee extension 3/5, hip flexion 5/5 LLE Sensation: WNL   Cervical / Trunk Assessment Cervical / Trunk Assessment: Other exceptions Cervical / Trunk Exceptions: s/p spinal sx   Communication Communication Communication: No difficulties   Cognition Arousal/Alertness: Awake/alert Behavior During Therapy: WFL for tasks assessed/performed Overall Cognitive Status: Within Functional Limits for tasks assessed                                     General Comments       Exercises     Shoulder Instructions      Home Living Family/patient expects to be discharged to:: Private residence Living Arrangements: Spouse/significant other Available Help at Discharge: Family;Available 24 hours/day Type of Home: House Home Access: Stairs to enter [employer] of Steps: 2 Entrance Stairs-Rails: Right;Left Home Layout: One level     Bathroom Shower/Tub: Occupational psychologist: Handicapped height     Home Equipment: Shower seat - built in          Prior Functioning/Environment Level of Independence: Independent                 OT Problem List: Pain;Decreased knowledge of precautions;Decreased knowledge of use of DME or AE;Impaired balance (sitting and/or standing);Decreased activity tolerance      OT Treatment/Interventions:      OT Goals(Current goals can be found in the care plan section) Acute Rehab OT Goals Patient Stated Goal: home today OT Goal Formulation: With patient  OT Frequency:     Barriers to D/C:            Co-evaluation              AM-PAC OT "6 Clicks" Daily Activity     Outcome Measure Help from another person eating meals?: None Help from another person taking care of personal grooming?: None Help from another  person toileting, which includes using toliet, bedpan, or urinal?: None Help from another person bathing (including washing, rinsing, drying)?: A Little Help from another person to put on and taking off regular upper body clothing?: None Help from another person to put on and taking off regular lower body clothing?: A Little 6 Click Score: 22   End of Session Equipment Utilized During Treatment: Rolling walker;Back brace Nurse Communication: Mobility status  Activity Tolerance: Patient tolerated treatment well Patient left: in chair;with call bell/phone within reach  OT Visit Diagnosis: Unsteadiness on feet (R26.81);Pain Pain - part of body: (back- incisional)                Time: 3291-9166 OT Time Calculation (min): 24 min Charges:  OT General Charges $OT Visit: 1 Visit OT Evaluation $OT Eval Low Complexity: 1 Low OT Treatments $Self Care/Home Management : 8-22 mins  Delight Stare, OT Acute Rehabilitation Services Pager [phone] Office [phone]   Delight Stare 12/12/2018, 12:41 PM

## 2018-12-12 NOTE — Progress Notes (Signed)
Subjective: Patient reports doing well  Objective: Vital signs in last 24 hours: Temp:  [97 F (36.1 C)-98.3 F (36.8 C)] 98.3 F (36.8 C) (06/13 0356) Pulse Rate:  [96-109] 101 (06/13 0356) Resp:  [14-18] 18 (06/13 0356) BP: (105-151)/(55-94) 112/69 (06/13 0356) SpO2:  [93 %-100 %] 93 % (06/13 0356)  Intake/Output from previous day: 06/12 0701 - 06/13 0700 In: 1200 [I.V.:1000; IV Piggyback:200] Out: 1260 [Urine:1210; Blood:50] Intake/Output this shift: Total I/O In: 200 [IV Piggyback:200] Out: 200 [Urine:200]  Physical Exam: Full strength in legs.  Dressing CDI.  Numbness improved per patient.  Abdomen soft.  Lab Results: Recent Labs    12/10/18 1432  WBC 5.6  HGB 11.7*  HCT 36.4  PLT 207   BMET Recent Labs    12/10/18 1432  NA 139  K 3.8  CL 104  CO2 27  GLUCOSE 110*  BUN 7*  CREATININE 1.00  CALCIUM 9.0    Studies/Results: Dg Lumbar Spine 2-3 Views  Result Date: 12/11/2018 CLINICAL DATA:  Lumbar fusion EXAM: DG C-ARM 61-120 MIN; LUMBAR SPINE - 2-3 VIEW COMPARISON:  MRI from 08/18/2018 FLUOROSCOPY TIME:  Radiation Exposure Index (as provided by the fluoroscopic device): Not available If the device does not provide the exposure index: Fluoroscopy Time:  41 seconds Number of Acquired Images:  2 FINDINGS: Fusion at L5-S1 is noted with anterior fixation. No acute bony abnormality is noted. IMPRESSION: L5-S1 fusion anteriorly. Electronically Signed   By: Inez Catalina M.D.   On: 12/11/2018 10:24   Dg C-arm 1-60 Min  Result Date: 12/11/2018 CLINICAL DATA:  Lumbar fusion EXAM: DG C-ARM 61-120 MIN; LUMBAR SPINE - 2-3 VIEW COMPARISON:  MRI from 08/18/2018 FLUOROSCOPY TIME:  Radiation Exposure Index (as provided by the fluoroscopic device): Not available If the device does not provide the exposure index: Fluoroscopy Time:  41 seconds Number of Acquired Images:  2 FINDINGS: Fusion at L5-S1 is noted with anterior fixation. No acute bony abnormality is noted. IMPRESSION:  L5-S1 fusion anteriorly. Electronically Signed   By: Inez Catalina M.D.   On: 12/11/2018 10:24   Dg Or Local Abdomen  Result Date: 12/11/2018 CLINICAL DATA:  Status post anterior lumbar fusion EXAM: OR LOCAL ABDOMEN COMPARISON:  None. FINDINGS: Scattered large and small bowel gas is noted. Some air is noted in the left hemipelvis related to the recent surgery. Fusion at L5-S1 is noted anteriorly. No radiopaque foreign body is seen. IMPRESSION: Postsurgical changes without radiopaque foreign body. Critical Value/emergent results were called by telephone at the time of interpretation on 12/11/2018 at 10:10 am to Dr. Erline Levine , who verbally acknowledged these results. Electronically Signed   By: Inez Catalina M.D.   On: 12/11/2018 10:10    Assessment/Plan: Doing well.  Discharge home.    LOS: 1 day    [name], MD 12/12/2018, 6:42 AM

## 2018-12-14 ENCOUNTER — Encounter (HOSPITAL_COMMUNITY): Payer: Self-pay | Admitting: Neurosurgery

## 2018-12-14 MED FILL — Sodium Chloride IV Soln 0.9%: INTRAVENOUS | Qty: 1000 | Status: AC

## 2018-12-14 MED FILL — Heparin Sodium (Porcine) Inj 1000 Unit/ML: INTRAMUSCULAR | Qty: 30 | Status: AC

## 2018-12-17 ENCOUNTER — Telehealth: Payer: Self-pay | Admitting: *Deleted

## 2018-12-17 NOTE — Telephone Encounter (Signed)
Patient advised that no additional scripts will be written by Dr.Naveira. It is not a violation of the Medication Agreement to receive scripts for additional meds for an acute situation.

## 2018-12-25 ENCOUNTER — Emergency Department
Admission: EM | Admit: 2018-12-25 | Discharge: 2018-12-25 | Disposition: A | Discharge status: Home / Self Care | Payer: Medicare Other | Attending: Emergency Medicine | Admitting: Emergency Medicine

## 2018-12-25 ENCOUNTER — Other Ambulatory Visit: Payer: Self-pay

## 2018-12-25 DIAGNOSIS — I10 Essential (primary) hypertension: Secondary | ICD-10-CM | POA: Insufficient documentation

## 2018-12-25 DIAGNOSIS — Z85828 Personal history of other malignant neoplasm of skin: Secondary | ICD-10-CM | POA: Insufficient documentation

## 2018-12-25 DIAGNOSIS — Z79899 Other long term (current) drug therapy: Secondary | ICD-10-CM | POA: Diagnosis not present

## 2018-12-25 DIAGNOSIS — I2510 Atherosclerotic heart disease of native coronary artery without angina pectoris: Secondary | ICD-10-CM | POA: Diagnosis not present

## 2018-12-25 DIAGNOSIS — Z87891 Personal history of nicotine dependence: Secondary | ICD-10-CM | POA: Diagnosis not present

## 2018-12-25 DIAGNOSIS — G8918 Other acute postprocedural pain: Secondary | ICD-10-CM | POA: Insufficient documentation

## 2018-12-25 DIAGNOSIS — M549 Dorsalgia, unspecified: Secondary | ICD-10-CM | POA: Diagnosis present

## 2018-12-25 MED ORDER — METHYLPREDNISOLONE SODIUM SUCC 125 MG IJ SOLR
125.0000 mg | Freq: Once | INTRAMUSCULAR | Status: AC
  Administered 2018-12-25: 125 mg via INTRAMUSCULAR
  Filled 2018-12-25: qty 2

## 2018-12-25 MED ORDER — HYDROMORPHONE HCL 1 MG/ML IJ SOLN
1.0000 mg | Freq: Once | INTRAMUSCULAR | Status: AC
  Administered 2018-12-25: 1 mg via INTRAMUSCULAR
  Filled 2018-12-25: qty 1

## 2018-12-25 MED ORDER — LIDOCAINE 5 % EX PTCH: 1.0000 | MEDICATED_PATCH | Freq: Two times a day (BID) | CUTANEOUS | 0 refills | Status: AC

## 2018-12-25 MED ORDER — LIDOCAINE 5 % EX PTCH
1.0000 | MEDICATED_PATCH | CUTANEOUS | Status: DC
  Administered 2018-12-25: 1 via TRANSDERMAL
  Filled 2018-12-25: qty 1

## 2018-12-25 NOTE — ED Triage Notes (Signed)
Pt reports that she had surgery on her back 6/19 - she now has a sharp/burning pain that runs from lower back down left leg into foot - she started prednisone taper that relieved the pain until last dose - she spoke with PCP yesterday and they told to come to ED

## 2018-12-25 NOTE — ED Provider Notes (Signed)
[facility] Emergency Department Provider Note   ____________________________________________   None    (approximate)  I have reviewed the triage vital signs and the nursing notes.   HISTORY  Chief Complaint Back Pain  Patient rates pain as a 7/10.  Patient's chronic pain is sharp/burning".  Patient denies bladder bowel dysfunction. HPI Jacqueline Thornton is a 68 y.o. female patient presents with postop pain secondary to spinal fusion of the lumbar spine 12/18/2018.  Patient states is a sharp burning pain that runs from her back to the left lower leg.  Patient that she started prednisone taper dose prescribed by her PCP and was feeling better until he ran out of medication.  Patient states she has been unsuccessful in contacting the neurosurgeon to discuss her postop pain.  Patient is scheduled to see neurosurgeon in 3 days.  Patient rates the pain as a 10/10.  Review of pharmaceutical log shows the patient received 150 hydrocodone on 12/09/2018.  Patient states medication is not helping her pain.         Past Medical History:  Diagnosis Date  . Absolute anemia 04/11/2015  . Acute postoperative pain 08/07/2017  . Angina pectoris ([place])   . Anxiety   . Arthritis   . 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 Atypical face pain 04/24/2015  . Back pain   . CAD (coronary artery disease)   . 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 Chronic back pain   . Depression   . Fibromyalgia   . GERD (gastroesophageal reflux disease)   . 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) 04/11/2015  . H/O: hysterectomy 1979  . Heart murmur   . Hyperlipidemia   . Hypertension   . Low back pain 04/06/2015  . Lumbar radicular pain 04/18/2015  . Migraine   . Narrowing of intervertebral disc space 04/11/2015  . Sacroiliac joint pain 04/11/2015  . Spine disorder     Patient Active Problem List   Diagnosis Date Noted  . Spondylolisthesis of lumbar region 12/11/2018  .  DDD (degenerative disc disease), cervical 05/14/2018  . Vitamin B12 deficiency anemia 02/25/2018  . Vitamin B12 deficiency 02/25/2018  . Trigger point with back pain (Right) 02/25/2018  . Fibromyalgia 02/25/2018  . 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 Left ovarian cyst 02/17/2018  . Bone island of right femur 12/11/2017  . Age-related osteoporosis without current pathological fracture 10/08/2017  . Spondylosis without myelopathy or radiculopathy, lumbosacral region 09/16/2017  . Spondylosis without myelopathy or radiculopathy, sacral and sacrococcygeal region 09/16/2017  . Other specified dorsopathies, sacral and sacrococcygeal region 09/16/2017  . Pseudophakia of left eye 02/24/2017  . Age-related nuclear cataract of left eye 12/24/2016  . Posterior subcapsular polar age-related cataract of left eye 12/24/2016  . Pseudophakia of right eye 12/23/2016  . Trochanteric bursitis of hip (Bilateral) (L>R) 12/05/2016  . DDD (degenerative disc disease), lumbar 12/04/2016  . Osteoarthritis of hip (Left) 01/23/2016  . Neurogenic pain 09/27/2015  . Myofascial pain 09/27/2015  . Trochanteric bursitis of right hip 08/23/2015  . Chronic hip pain (Bilateral) (L>R) 08/23/2015  . Chronic sacroiliac joint pain (Bilateral) (L>R) 07/06/2015  . Chronic lumbar radicular pain (Primary Area of Pain) (Left) (S1 Dermatome) 07/06/2015  . Long term prescription opiate use 07/06/2015  . Encounter for chronic pain management 07/06/2015  . Long term current use of opiate analgesic 04/18/2015  . Encounter for therapeutic drug level monitoring 04/18/2015  . Uncomplicated opioid dependence (  [place]) 04/18/2015  . Opiate use (56.25 MME/Day) 04/18/2015  . Myalgia 04/18/2015  . Anemia 04/11/2015  . Gout 04/11/2015  . Hyperlipidemia 04/11/2015  . Headache, migraine 04/11/2015  . Panic attack 04/11/2015  . Chronic low back pain (Secondary area of Pain) (Bilateral) (midline to tailbone) (R>L)  04/11/2015  . Spondylosis of lumbar spine 04/11/2015  . Lumbar annular disc tear (L4-5) 04/11/2015  . Discogenic low back pain (L3-4 and L4-5) 04/11/2015  . Lumbar facet hypertrophy 04/11/2015  . Lumbar facet syndrome (Bilateral) (R>L) 04/11/2015  . Chronic neck pain 04/11/2015  . Cervical spondylosis 04/11/2015  . Hx of cervical spine surgery 04/11/2015  . Cervical spinal fusion (C6-7 interbody fusion) 04/11/2015    Class: History of  . Coronary atherosclerosis 04/11/2015  . CAD in native artery 04/11/2015  . Arthrodesis status 04/11/2015  . Chronic pain syndrome 04/06/2015  . Anxiety, generalized 01/15/2014    Past Surgical History:  Procedure Laterality Date  . ABDOMINAL EXPOSURE N/A 12/11/2018   Procedure: ABDOMINAL EXPOSURE;  Surgeon: Serafina Mitchell, MD;  Location: [facility] OR;  Service: Vascular;  Laterality: N/A;  . ABDOMINAL HYSTERECTOMY    . ANTERIOR LUMBAR FUSION N/A 12/11/2018   Procedure: Lumbar five Sacral one Anterior lumbar interbody fusion;  Surgeon: Erline Levine, MD;  Location: [place];  Service: Neurosurgery;  Laterality: N/A;  . APPENDECTOMY     Age19  . caract surger     02/24/17  . CHOLECYSTECTOMY    . COLONOSCOPY WITH PROPOFOL N/A 07/19/2015   Procedure: COLONOSCOPY WITH PROPOFOL;  Surgeon: Manya Silvas, MD;  Location: [facility] ENDOSCOPY;  Service: Endoscopy;  Laterality: N/A;  . CYSTOSCOPY  03/03/2018   Procedure: CYSTOSCOPY;  Surgeon: Will Bonnet, MD;  Location: [facility] ORS;  Service: Gynecology;;  . LAPAROSCOPIC SALPINGO OOPHERECTOMY Bilateral 03/03/2018   Procedure: LAPAROSCOPIC SALPINGO OOPHORECTOMY;  Surgeon: Will Bonnet, MD;  Location: [facility] ORS;  Service: Gynecology;  Laterality: Bilateral;  . NECK SURGERY     Age 60 and 18.  Marland Kitchen SHOULDER SURGERY    . TONSILLECTOMY     Age 72  . TUMOR REMOVAL     Ovaries    Prior to Admission medications   Medication Sig Start Date End Date Taking? Authorizing Provider  Cholecalciferol (VITAMIN D3) 50000 units  CAPS Take 50,000 Units by mouth every Wednesday.  02/11/18   [provider]  Cyanocobalamin (VITAMIN B-12) 5000 MCG SUBL Place 1 tablet (5,000 mcg total) under the tongue daily. 12/23/18 03/23/19  Milinda Pointer, MD  cyclobenzaprine (FLEXERIL) 10 MG tablet Take 1 tablet (10 mg total) by mouth 3 (three) times daily as needed for muscle spasms. 12/09/18 03/11/19  Milinda Pointer, MD  DULoxetine (CYMBALTA) 60 MG capsule Take 60 mg by mouth at bedtime.  05/23/15   [provider]  enalapril (VASOTEC) 5 MG tablet Take 5 mg by mouth daily.    [provider]  esomeprazole (NEXIUM) 20 MG capsule Take 20 mg by mouth at bedtime.     [provider]  estradiol (ESTRACE) 0.5 MG tablet Take 0.5 mg by mouth at bedtime.  07/30/18   [provider]  gabapentin (NEURONTIN) 300 MG capsule Take 1 capsule (300 mg total) by mouth 2 (two) times daily. 12/21/18 03/23/19  Milinda Pointer, MD  hydrochlorothiazide (HYDRODIURIL) 12.5 MG tablet Take 12.5 mg by mouth daily. 09/24/18   [provider]  lidocaine (LIDODERM) 5 % Place 1 patch onto the skin every 12 (twelve) hours for 5 days. Remove & Discard  patch within 12 hours or as directed by MD 12/25/18 12/30/18  Sable Feil, PA-C  Melatonin 3 MG TABS Take 3 mg by mouth at bedtime as needed (for sleep).     [provider]  methocarbamol (ROBAXIN) 500 MG tablet Take 1 tablet (500 mg total) by mouth every 6 (six) hours as needed for muscle spasms. 12/12/18   Erline Levine, MD  oxyCODONE-acetaminophen (PERCOCET) 7.5-325 MG tablet Take 1 tablet by mouth 5 (five) times daily for 30 days. Must last 30 days 12/23/18 01/22/19  Milinda Pointer, MD  oxyCODONE-acetaminophen (PERCOCET) 7.5-325 MG tablet Take 1 tablet by mouth 5 (five) times daily for 30 days. Must last 30 days 01/22/19 02/21/19  Milinda Pointer, MD  oxyCODONE-acetaminophen (PERCOCET) 7.5-325 MG tablet Take 1 tablet by mouth 5 (five) times daily for 30  days. Must last 30 days 02/21/19 03/23/19  Milinda Pointer, MD  rosuvastatin (CRESTOR) 5 MG tablet Take 5 mg by mouth at bedtime.     [provider]    Allergies Patient has no known allergies.  Family History  Problem Relation Age of Onset  . Diabetes Father   . Hypertension Sister   . Diabetes Mother   . Stroke Mother   . Colon cancer Mother   . Colon cancer Maternal Grandmother   . Ovarian cancer Neg Hx   . Breast cancer Neg Hx     Social History Social History   Tobacco Use  . Smoking status: Former Smoker    Quit date: 07/02/2003    Years since quitting: 15.4  . Smokeless tobacco: Never Used  Substance Use Topics  . Alcohol use: No    Alcohol/week: 0.0 standard drinks  . Drug use: No    Review of Systems  Constitutional: No fever/chills Eyes: No visual changes. ENT: No sore throat. Cardiovascular: Denies chest pain. Respiratory: Denies shortness of breath. Gastrointestinal: No abdominal pain.  No nausea, no vomiting.  No diarrhea.  No constipation. Genitourinary: Negative for dysuria. Musculoskeletal: Positive for back pain. Skin: Negative for rash. Neurological: Patient is a burning sensation traveling down the left leg.   Psychiatric:  Anxiety depression. Endocrine:  Hyperlipidemia hypertension. ____________________________________________   PHYSICAL EXAM:  VITAL SIGNS: ED Triage Vitals  Enc Vitals Group     BP 12/25/18 1419 121/65     Pulse Rate 12/25/18 1419 82     Resp 12/25/18 1419 18     Temp 12/25/18 1419 98.4 F (36.9 C)     Temp Source 12/25/18 1419 Oral     SpO2 12/25/18 1419 100 %     Weight 12/25/18 1420 142 lb (64.4 kg)     Height 12/25/18 1420 5' (1.524 m)     Head Circumference --      Peak Flow --      Pain Score 12/25/18 1426 7     Pain Loc --      Pain Edu? --      Excl. in [place]? --    Constitutional: Alert and oriented. Well appearing and in no acute distress. Hematological/Lymphatic/Immunilogical: No cervical  lymphadenopathy. Cardiovascular: Normal rate, regular rhythm. Grossly normal heart sounds.  Good peripheral circulation. Respiratory: Normal respiratory effort.  No retractions. Lungs CTAB. Gastrointestinal: Deferred secondary to patient wearing a lumbar from her surgery.   Genitourinary: Deferred Musculoskeletal: Examination lumbar spine was deferred secondary to application of a lumbar brace.   Neurologic:  Normal speech and language. No gross focal neurologic deficits are appreciated. No gait instability. Skin:  Skin  is warm, dry and intact. No rash noted. Psychiatric: Mood and affect are normal. Speech and behavior are normal.  ____________________________________________   LABS (all labs ordered are listed, but only abnormal results are displayed)  Labs Reviewed - No data to display ____________________________________________  EKG   ____________________________________________  RADIOLOGY  ED MD interpretation:    Official radiology report(s): No results found.  ____________________________________________   PROCEDURES  Procedure(s) performed (including Critical Care):  Procedures   ____________________________________________   INITIAL IMPRESSION / ASSESSMENT AND PLAN / ED COURSE  As part of my medical decision making, I reviewed the following data within the [place]         Patient presents with postop pain secondary to lumbar fusion 7 days ago.  Patient given Solu-Medrol IM, Dilaudid IM, and application of a Lidoderm patch.  Revealed patient's narcotic history patient will be given Lidoderm patches for discharge.  Advised to continue her narcotic pain medication until she follow-up with neurosurgeon in 3 days.      ____________________________________________   FINAL CLINICAL IMPRESSION(S) / ED DIAGNOSES  Final diagnoses:  Postoperative pain after spinal surgery     ED Discharge Orders         Ordered    lidocaine  (LIDODERM) 5 %  Every 12 hours     12/25/18 1534           Note:  This document was prepared using Dragon voice recognition software and may include unintentional dictation errors.    Sable Feil, PA-C 12/25/18 1534    Earleen Newport, MD 12/25/18 [id number]

## 2018-12-25 NOTE — ED Notes (Signed)
See triage note  States back surgery on 6/19  States she then developed a burning type pain to left buttock which radiated into left leg  Was placed on steroids    And states pain returned 2 days after taking the steroids  Ambulates well

## 2019-02-11 ENCOUNTER — Other Ambulatory Visit: Payer: Self-pay

## 2019-02-11 DIAGNOSIS — Z20822 Contact with and (suspected) exposure to covid-19: Secondary | ICD-10-CM

## 2019-02-13 LAB — NOVEL CORONAVIRUS, NAA: SARS-CoV-2, NAA: NOT DETECTED

## 2019-02-13 LAB — SPECIMEN STATUS REPORT

## 2019-03-05 DIAGNOSIS — I82409 Acute embolism and thrombosis of unspecified deep veins of unspecified lower extremity: Secondary | ICD-10-CM | POA: Insufficient documentation

## 2019-03-16 IMAGING — MR MR SACRUM / SI JOINTS WO CM
8 of 10 series · 35 of 48 positions shown · non-contrast
Comparison: Radiographs of the sacroiliac joints dated 12/10/2017
and lumbar MRI dated 02/27/2016 and lumbar radiographs dated
12/12/2014

CLINICAL DATA: Low back pain and bilateral buttock and hip and leg
pain. Intermittent numbness and tingling. Chronic sacroiliac joint
pain.

EXAM:
MRI LUMBAR SPINE WITHOUT CONTRAST; MRI SACRUM WITHOUT CONTRAST
TECHNIQUE: Multiplanar, multisequence MR imaging of the lumbar spine and sacrum
was performed. No intravenous contrast was administered.

[Series 2: T2 · sagittal · 4.0mm · 0.81mm/px · 2 of 15 slices shown (1 of 2)]
[im 1/15]
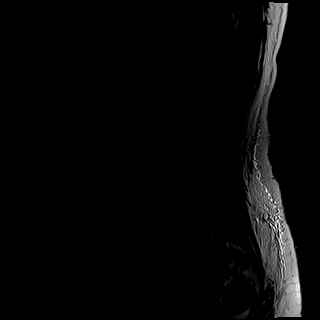
[im 15/15]
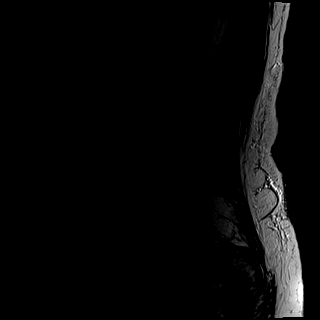

[Series 3: T1 · sagittal · 4.0mm · 0.81mm/px · 3 of 15 slices shown (1 of 4)]
[im 1/15]
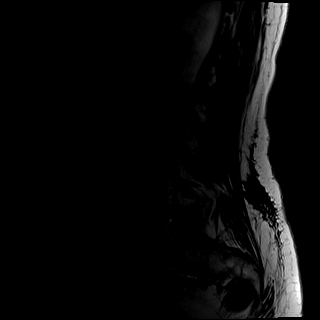
[im 8/15]
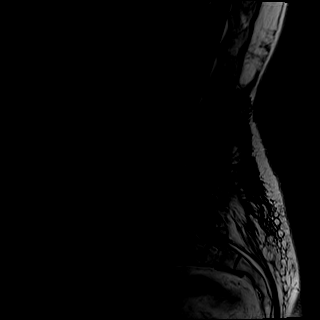
[im 15/15]
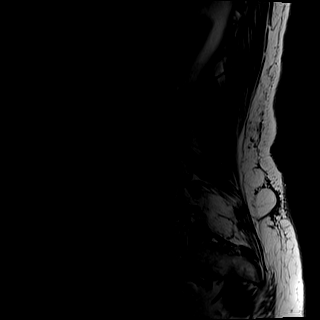

[Series 4: STIR · sagittal · 4.0mm · 0.81mm/px · 3 of 15 slices shown (1 of 2)]
[im 1/15]
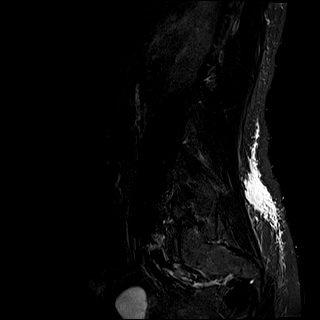
[im 8/15]
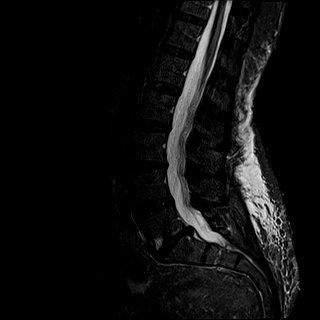
[im 15/15]
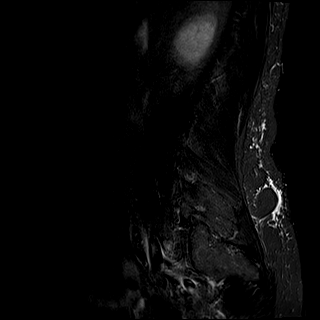

[Series 5: T2 · axial · 4.0mm · 0.78mm/px · z∈[+30,+208]mm · 6 of 30 slices shown (2 of 2)]
[im 1/30]
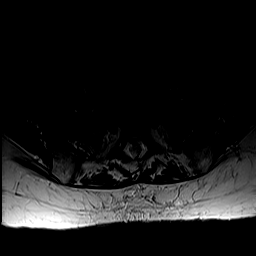
[im 6/30]
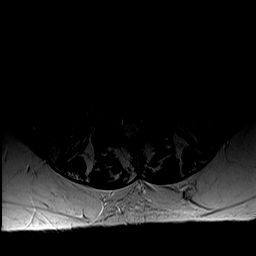
[im 12/30]
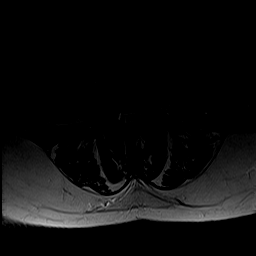
[im 18/30]
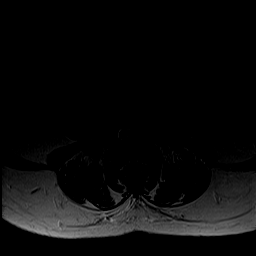
[im 24/30]
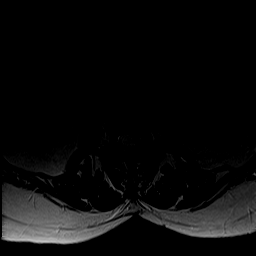
[im 30/30]
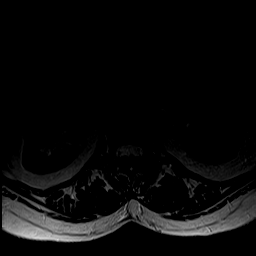

[Series 6: T1 · axial · 4.0mm · 0.39mm/px · z∈[+30,+208]mm · 6 of 30 slices shown (2 of 4)]
[im 1/30]
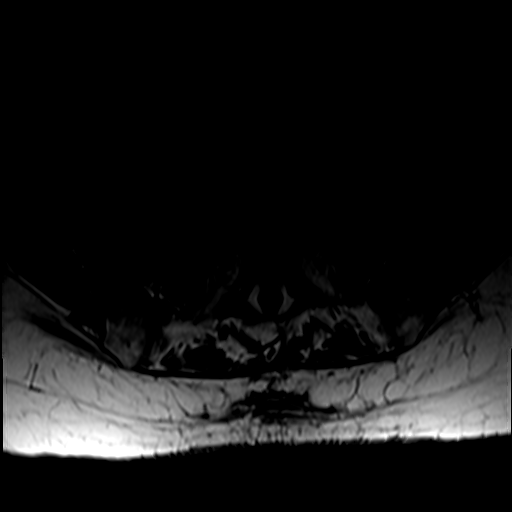
[im 6/30]
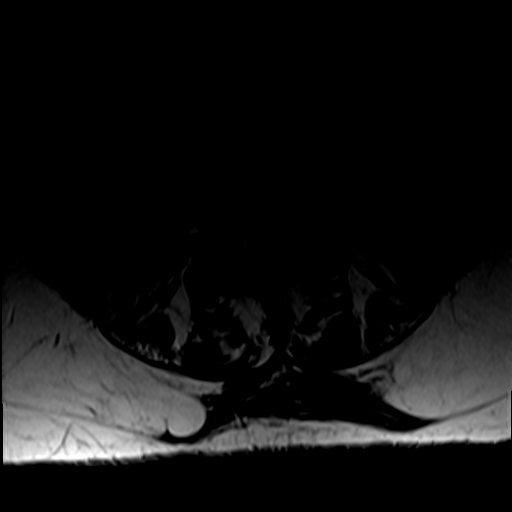
[im 12/30]
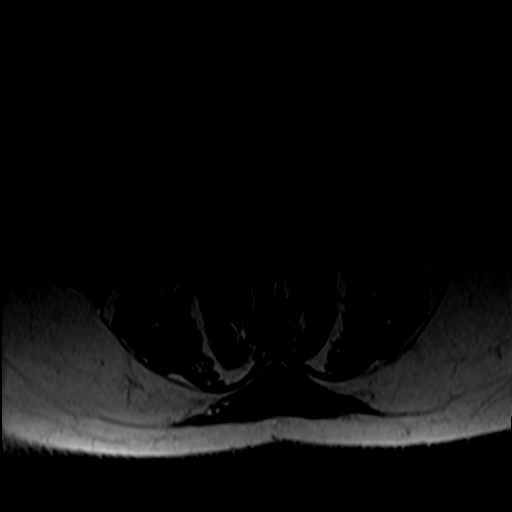
[im 18/30]
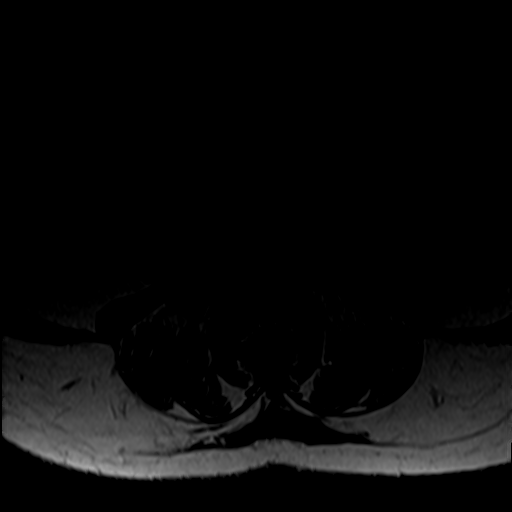
[im 24/30]
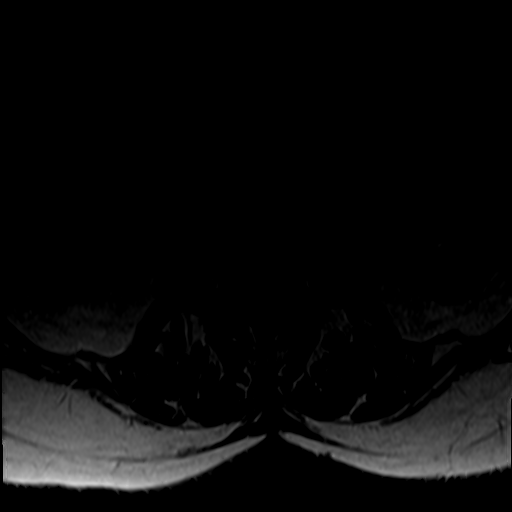
[im 30/30]
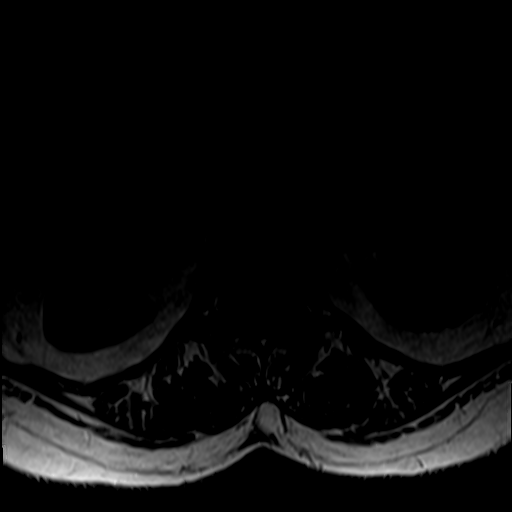

[Series 9: T1 · axial · 5.0mm · 0.66mm/px · z∈[-96,+23]mm · 6 of 28 slices shown (3 of 4)]
[im 1/28]
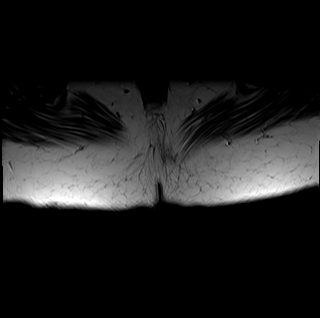
[im 6/28]
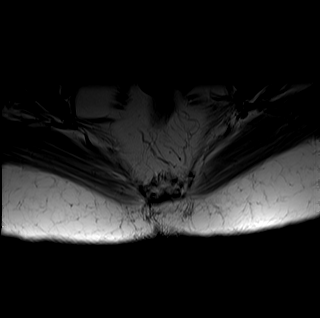
[im 11/28]
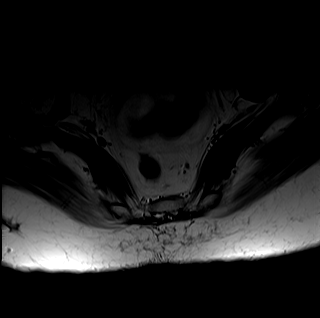
[im 17/28]
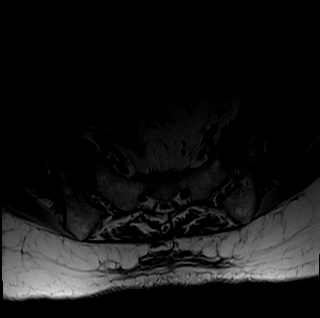
[im 22/28]
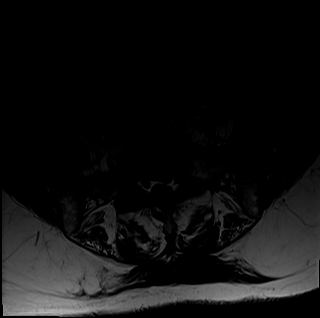
[im 28/28]
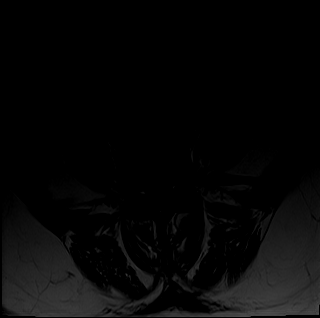

[Series 11: T1 · oblique · 5.0mm · 0.62mm/px · 5 of 24 slices shown (4 of 4)]
[im 1/24]
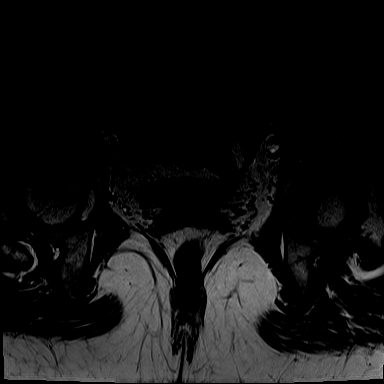
[im 6/24]
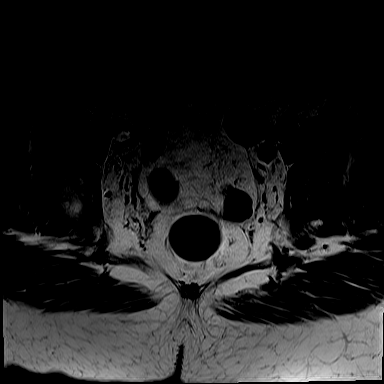
[im 12/24]
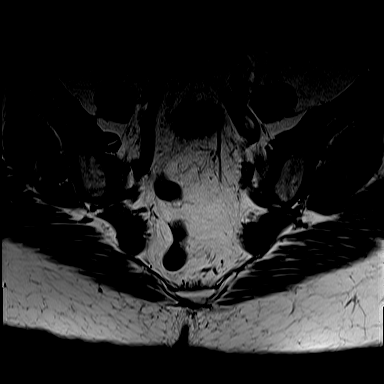
[im 18/24]
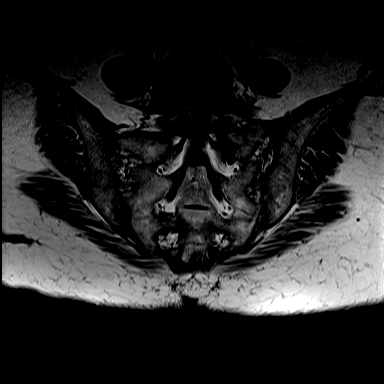
[im 24/24]
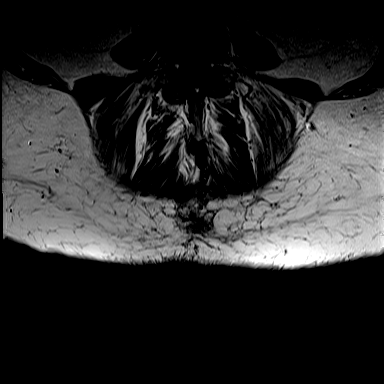

[Series 12: STIR · oblique · 5.0mm · 0.94mm/px · 4 of 24 slices shown (2 of 2)]
[im 1/24]
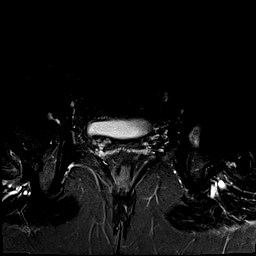
[im 6/24]
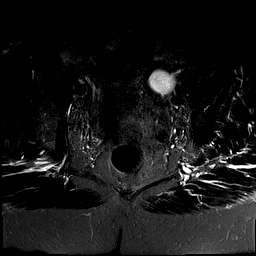
[im 12/24]
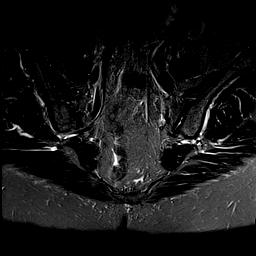
[im 18/24]
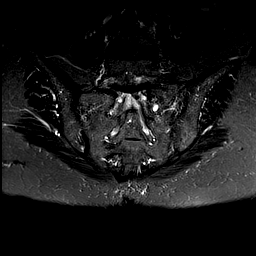

[35 of 48 positions shown; findings below may reference images not displayed]

FINDINGS: MRI OF THE LUMBAR SPINE WITHOUT CONTRAST

Segmentation: Standard.

Alignment:  Physiologic.

Vertebrae: Chronic progressive degenerative changes of the vertebral
endplates at L5-S1.

Conus medullaris and cauda equina: Conus extends to the L1-2 level.
Conus and cauda equina appear normal.

Paraspinal and other soft tissues: 3 cm complex cystic lesion in the
left adnexa, slightly larger than on the prior study, when it
measured 2.2 cm. Otherwise negative.

Disc levels:

L1-2: Negative.

L2-3: Tiny broad-based disc bulge with no neural impingement, new
since the prior study. Is negative.

L3-4: Slight disc space narrowing. Small broad-based disc bulge,
essentially unchanged since the prior study. No neural impingement.

L4-5: No disc bulging or protrusion. Slight hypertrophy of the
ligamentum flavum and facet joints without neural impingement,
unchanged. Small ganglion cysts at the inferior aspects of both
facet joints.

L5-S1: Progressive degenerative disc disease with increased disc
space narrowing, increased degenerative changes of the vertebral
endplates, and a new small broad-based soft disc protrusion with a
small disc extrusion extending inferiorly behind the body of S1
slightly to the left of midline immediately adjacent to the left S1
nerve root sleeve with a mass effect upon the nerve root sleeve.
Slight right facet arthritis, new since the prior study.

MRI OF THE SACRUM WITHOUT CONTRAST

The bones of the sacrum and coccyx appear normal. Sacroiliac joints
are normal.

There is focal edema in the left paraspinal musculature at the L4-5
and L5-S1 levels, best seen on series 14.

Again noted is the disc protrusion and extrusion at L5-S1. Slight
mass effect upon the left S1 nerve root sleeve is visible on image 6
of series 9 and series 14.
IMPRESSION: 1. Small broad-based disc protrusion at L5-S1 with a small disc
extrusion extending inferiorly behind the body of S1 on the left
with a slight mass effect upon the left S1 nerve root sleeve.
2. Slight bilateral facet arthritis at L4-5 and right facet
arthritis at L5-S1.
3. Nonspecific edema in the left paraspinal musculature at L4-5 and
L5-S1, nonspecific.
4. Normal appearing sacrum and coccyx.
5. Enlarging complex left adnexal cyst. Recommend pelvic ultrasound
for further characterization of this probably benign abnormality.
However, because it has enlarged in the 2 year interval and because
of the internal septations, ultrasound characterization is
suggested.

## 2019-03-18 ENCOUNTER — Telehealth: Payer: Self-pay | Admitting: *Deleted

## 2019-03-18 ENCOUNTER — Telehealth: Payer: Self-pay | Admitting: Pain Medicine

## 2019-03-18 ENCOUNTER — Encounter: Payer: Self-pay | Admitting: Pain Medicine

## 2019-03-18 NOTE — Telephone Encounter (Signed)
Called patient for pre appointment assessment. Unable to do this at this time because she is at another appointment.

## 2019-03-18 NOTE — Telephone Encounter (Signed)
Patient returned nurse call for meds update

## 2019-03-21 NOTE — Progress Notes (Signed)
Pain Management Virtual Encounter Note - Virtual Visit via Telephone Telehealth (real-time audio visits between healthcare provider and patient).   Patient's Phone No. & Preferred Pharmacy:  [phone] (home); [phone] (mobile); (Preferred) [phone] [email or web address]  [facility] [place] - [address] [place]) [place] [address] Phone: [phone] Fax: [phone]    Pre-screening note:  Our staff contacted [name] and offered her an "in person", "face-to-face" appointment versus a telephone encounter. She indicated preferring the telephone encounter, at this time.   Reason for Virtual Visit: COVID-19*  Social distancing based on CDC and AMA recommendations.   I contacted Jacqueline Thornton on 03/22/2019 via telephone.      I clearly identified myself as Gaspar Cola, MD. I verified that I was speaking with the correct person using two identifiers (Name: Jacqueline Thornton, and date of birth: Jun 30, 1951).  Advanced Informed Consent I sought verbal advanced consent from Jacqueline Thornton for virtual visit interactions. I informed [name] of possible security and privacy concerns, risks, and limitations associated with providing "not-in-person" medical evaluation and management services. I also informed [name] of the availability of "in-person" appointments. Finally, I informed her that there would be a charge for the virtual visit and that she could be  personally, fully or partially, financially responsible for it. Ms. Picker expressed understanding and agreed to proceed.   Historic Elements   [name] is a 68 y.o. year old, female patient evaluated today after her last encounter by our practice on 03/18/2019. 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 She also  has a past surgical history that includes Cholecystectomy; Abdominal hysterectomy; Neck surgery; Shoulder surgery; Colonoscopy with propofol (N/A, 07/19/2015); caract surger; Laparoscopic salpingo oophorectomy (Bilateral, 03/03/2018); Cystoscopy (03/03/2018); Tonsillectomy; Appendectomy; Tumor removal; Anterior lumbar fusion (N/A, 12/11/2018); and Abdominal exposure (N/A, 12/11/2018). Ms. Werline has a current medication list which includes the following prescription(s): vitamin d3, vitamin b-12, duloxetine, enalapril, esomeprazole, estradiol, gabapentin, hydrochlorothiazide, melatonin, methocarbamol, xarelto, rosuvastatin, cyclobenzaprine, oxycodone-acetaminophen, oxycodone-acetaminophen, and oxycodone-acetaminophen. She  reports that she quit smoking about 15 years ago. She has never used smokeless tobacco. She reports that she does not drink alcohol or use drugs. Ms. Brandell has No Known Allergies.   HPI  Today, she is being contacted for medication management.  The patient indicates doing well with the current medication regimen. No adverse reactions or side effects reported to the medications.  The patient indicates that she was recently started on Xarelto secondary to some blood clots.  The prescribing physician for this is Dr. Devona Konig.  The patient also indicates that she is somewhat disappointed with the surgery that she had on December 11, 2018.  She indicates still having some problems.  The surgery  was done by Dr. Vertell Limber (neurosurgeon).  According to the records, the patient had an L5-S1 anterior interbody fusion.  Pharmacotherapy Assessment  Analgesic: Oxycodone/APAP 7.5/325, 1 tab PO q 5 times a day. (56.25 mg/day of oxycodone) MME/day:56.25 mg/day.   Monitoring: Pharmacotherapy: No side-effects or  adverse reactions reported. [place] PMP: PDMP reviewed during this encounter.       Compliance: No problems identified. Effectiveness: Clinically acceptable. Plan: Refer to "POC".  UDS:  Summary  Date Value Ref Range Status  06/09/2018 FINAL  Final    Comment:    ==================================================================== TOXASSURE SELECT 13 (MW) ==================================================================== Test                             Result       Flag       Units Drug Present and Declared for Prescription Verification   Oxycodone                      3546         EXPECTED   ng/mg creat   Oxymorphone                    1252         EXPECTED   ng/mg creat   Noroxycodone                   5124         EXPECTED   ng/mg creat   Noroxymorphone                 405          EXPECTED   ng/mg creat    Sources of oxycodone are scheduled prescription medications.    Oxymorphone, noroxycodone, and noroxymorphone are expected    metabolites of oxycodone. Oxymorphone is also available as a    scheduled prescription medication. ==================================================================== Test                      Result    Flag   Units      Ref Range   Creatinine              85               mg/dL      >=20 ==================================================================== Declared Medications:  The flagging and interpretation on this report are based on the  following declared medications.  Unexpected results may arise from  inaccuracies in the declared medications.  **Note: The testing scope of this panel includes these medications:  Oxycodone  Oxycodone  (Oxycodone Acetaminophen)  **Note: The testing scope of this panel does not include following  reported medications:  Acetaminophen (Oxycodone Acetaminophen)  Cholecalciferol  Cyanocobalamin  Cyclobenzaprine  Duloxetine  Enalapril  Esomeprazole  Gabapentin  Ibuprofen (Advil)  Melatonin  Rosuvastatin  Topical ==================================================================== For clinical consultation, please call [phone]. ====================================================================    Laboratory Chemistry Profile (12 mo)  Renal: 12/10/2018: BUN 7; Creatinine, Ser 1.00  Lab Results  Component Value Date   GFRAA >60 12/10/2018   GFRNONAA 59 (L) 12/10/2018   Hepatic: No results found for requested labs within last 8760 hours. Lab Results  Component Value Date   AST 14 (L) 03/08/2018   ALT 14 03/08/2018   Other: No results found for requested labs within last 8760 hours. Note: Above Lab results reviewed.  Imaging  Last 90 days:  No results found.  Assessment  Diagnoses of Anemia due to vitamin B12 deficiency, unspecified B12 deficiency type, Vitamin B12 deficiency, Myofascial pain, Neurogenic pain, Chronic pain syndrome, Pharmacologic therapy, Disorder of skeletal system, Problems influencing health status, Chronic anticoagulation (Xarelto), and History of lumbar spinal fusion (Dr. Broadus John  Vertell Limber) (12/11/2018) were pertinent to this visit.  Plan of Care  I have discontinued Alta L. Pursel's oxyCODONE-acetaminophen and oxyCODONE-acetaminophen. I have also changed her oxyCODONE-acetaminophen. Additionally, I am having her start on oxyCODONE-acetaminophen and oxyCODONE-acetaminophen. Lastly, I am having her maintain her esomeprazole, enalapril, DULoxetine, rosuvastatin, Melatonin, Vitamin D3, estradiol, hydrochlorothiazide, methocarbamol, Xarelto, Vitamin B-12, cyclobenzaprine, and gabapentin.  Pharmacotherapy (Medications Ordered): Meds ordered this encounter   Medications  . Cyanocobalamin (VITAMIN B-12) 5000 MCG SUBL    Sig: Place 1 tablet (5,000 mcg total) under the tongue daily.    Dispense:  90 tablet    Refill:  0    Fill one day early if pharmacy is closed on scheduled refill date. May substitute for generic if available.  . cyclobenzaprine (FLEXERIL) 10 MG tablet    Sig: Take 1 tablet (10 mg total) by mouth 3 (three) times daily as needed for muscle spasms.    Dispense:  90 tablet    Refill:  2    Fill one day early if pharmacy is closed on scheduled refill date. May substitute for generic if available.  . gabapentin (NEURONTIN) 300 MG capsule    Sig: Take 1 capsule (300 mg total) by mouth 2 (two) times daily.    Dispense:  60 capsule    Refill:  2    Fill one day early if pharmacy is closed on scheduled refill date. May substitute for generic if available.  Marland Kitchen oxyCODONE-acetaminophen (PERCOCET) 7.5-325 MG tablet    Sig: Take 1 tablet by mouth 5 (five) times daily. Must last 30 days    Dispense:  150 tablet    Refill:  0    Chronic Pain: STOP Act (Not applicable) Fill 1 day early if closed on refill date. Do not fill until: 03/23/2019. To last until: 04/22/2019. Avoid benzodiazepines within 8 hours of opioids  . oxyCODONE-acetaminophen (PERCOCET) 7.5-325 MG tablet    Sig: Take 1 tablet by mouth 5 (five) times daily. Must last 30 days    Dispense:  150 tablet    Refill:  0    Chronic Pain: STOP Act (Not applicable) Fill 1 day early if closed on refill date. Do not fill until: 04/22/2019. To last until: 05/22/2019. Avoid benzodiazepines within 8 hours of opioids  . oxyCODONE-acetaminophen (PERCOCET) 7.5-325 MG tablet    Sig: Take 1 tablet by mouth 5 (five) times daily. Must last 30 days    Dispense:  150 tablet    Refill:  0    Chronic Pain: STOP Act (Not applicable) Fill 1 day early if closed on refill date. Do not fill until: 05/22/2019. To last until: 06/21/2019. Avoid benzodiazepines within 8 hours of opioids   Orders:  Orders  Placed This Encounter  Procedures  . Comp. Metabolic Panel (12)    With GFR. Indications: Chronic Pain Syndrome (G89.4) & Pharmacotherapy [id number])    Order Specific Question:   Has the patient fasted?    Answer:   No    Order Specific Question:   CC Results    Answer:   PCP-NURSE [id number]  . Blood Thinner Instructions to Nursing    If unable to stop, ask if Lovenox-bridge therapy may be possible, and if so, request their assistance in implementing it.    Scheduling Instructions:     Contact the physician (Dr. Devona Konig) prescribing the blood thinner and request clearance to stop it for time period stipulated below.     If approved by prescribing physician, stop Xarelto (Rivaroxaban) x 3  days prior to procedure or surgery.  . Blood Thinner Instructions to Nursing    If the patient requires a Lovenox-bridge therapy, make sure arrangements are made to institute it with the assistance of the PCP.    Standing Status:   Standing    Number of Occurrences:   36    Standing Expiration Date:   09/18/2020    Scheduling Instructions:     Always stop the Xarelto (Rivaroxaban) x 3 days prior to procedure or surgery.   Follow-up plan:   Return in about 3 months (around 06/21/2019) for (VV), (MM).      Interventional management options:  Considering:   NOTE: XARELTO ANTICOAGULATION. (Stop: 3 days  Restart: 6 hrs) Diagnostic caudal epidural steroid injection + diagnostic epidurogram Diagnostic CESI Diagnostic bilateral cervical facet block Possible bilateral cervical facet RFA  Diagnostic bilateral lumbar facet block Possible bilateral lumbar facet RFA  Diagnostic left L4-5 LESI  Diagnostic left S1 SNRB Diagnostic bilateral SI joint block Possible bilateral SI joint RFA  Diagnostic right trochanteric bursa injection   Palliative PRN treatment(s):   Palliativebilateral lumbar facet block #3+bilateral sacroiliac joint block#6 PalliativeCESI  Palliativebilateral cervical facet  block Palliativeleft L4-5 LESI  Palliativeleft caudal ESI  Palliativeleft S1 SNRB Palliativebilateral SI joint block Palliativeright trochanteric bursa injection    Recent Visits No visits were found meeting these conditions.  Showing recent visits within past 90 days and meeting all other requirements   Today's Visits Date Type Provider Dept  03/22/19 Office Visit Milinda Pointer, MD [facility]  Showing today's visits and meeting all other requirements   Future Appointments No visits were found meeting these conditions.  Showing future appointments within next 90 days and meeting all other requirements   I discussed the assessment and treatment plan with the patient. The patient was provided an opportunity to ask questions and all were answered. The patient agreed with the plan and demonstrated an understanding of the instructions.  Patient advised to call back or seek an in-person evaluation if the symptoms or condition worsens.  Total duration of non-face-to-face encounter: 13 minutes.  Note by: Gaspar Cola, MD Date: 03/22/2019; Time: 11:28 AM  Note: This dictation was prepared with Dragon dictation. Any transcriptional errors that may result from this process are unintentional.  Disclaimer:  * Given the special circumstances of the COVID-19 pandemic, the federal government has announced that the Office for Civil Rights (OCR) will exercise its enforcement discretion and will not impose penalties on physicians using telehealth in the event of noncompliance with regulatory requirements under the [place] (HIPAA) in connection with the good faith provision of telehealth during the XX123456 national public health emergency. ([place])

## 2019-03-22 ENCOUNTER — Ambulatory Visit: Payer: Medicare Other | Attending: Pain Medicine | Admitting: Pain Medicine

## 2019-03-22 ENCOUNTER — Other Ambulatory Visit: Payer: Self-pay

## 2019-03-22 DIAGNOSIS — M792 Neuralgia and neuritis, unspecified: Secondary | ICD-10-CM | POA: Diagnosis not present

## 2019-03-22 DIAGNOSIS — E538 Deficiency of other specified B group vitamins: Secondary | ICD-10-CM | POA: Diagnosis not present

## 2019-03-22 DIAGNOSIS — Z7901 Long term (current) use of anticoagulants: Secondary | ICD-10-CM | POA: Insufficient documentation

## 2019-03-22 DIAGNOSIS — M7918 Myalgia, other site: Secondary | ICD-10-CM | POA: Diagnosis not present

## 2019-03-22 DIAGNOSIS — D519 Vitamin B12 deficiency anemia, unspecified: Secondary | ICD-10-CM

## 2019-03-22 DIAGNOSIS — Z789 Other specified health status: Secondary | ICD-10-CM | POA: Insufficient documentation

## 2019-03-22 DIAGNOSIS — Z981 Arthrodesis status: Secondary | ICD-10-CM

## 2019-03-22 DIAGNOSIS — G894 Chronic pain syndrome: Secondary | ICD-10-CM

## 2019-03-22 DIAGNOSIS — Z79899 Other long term (current) drug therapy: Secondary | ICD-10-CM

## 2019-03-22 DIAGNOSIS — M899 Disorder of bone, unspecified: Secondary | ICD-10-CM

## 2019-03-22 MED ORDER — OXYCODONE-ACETAMINOPHEN 7.5-325 MG PO TABS: 1.0000 | ORAL_TABLET | Freq: Every day | ORAL | 0 refills | Status: DC

## 2019-03-22 MED ORDER — CYCLOBENZAPRINE HCL 10 MG PO TABS: 10.0000 mg | ORAL_TABLET | Freq: Three times a day (TID) | ORAL | 2 refills | Status: DC | PRN

## 2019-03-22 MED ORDER — VITAMIN B-12 5000 MCG SL SUBL: 5000.0000 ug | SUBLINGUAL_TABLET | Freq: Every day | SUBLINGUAL | 0 refills | Status: DC

## 2019-03-22 MED ORDER — GABAPENTIN 300 MG PO CAPS: 300.0000 mg | ORAL_CAPSULE | Freq: Two times a day (BID) | ORAL | 2 refills | Status: DC

## 2019-03-22 NOTE — Patient Instructions (Signed)
____________________________________________________________________________________________  Medication Rules  Purpose: To inform patients, and their family members, of our rules and regulations.  Applies to: All patients receiving prescriptions (written or electronic).  Pharmacy of record: Pharmacy where electronic prescriptions will be sent. If written prescriptions are taken to a different pharmacy, please inform the nursing staff. The pharmacy listed in the electronic medical record should be the one where you would like electronic prescriptions to be sent.  Electronic prescriptions: In compliance with the [place] Strengthen Opioid Misuse Prevention (STOP) Act of 2017 (Session Law 2017-74/H243), effective July 01, 2018, all controlled substances must be electronically prescribed. Calling prescriptions to the pharmacy will cease to exist.  Prescription refills: Only during scheduled appointments. Applies to all prescriptions.  NOTE: The following applies primarily to controlled substances (Opioid* Pain Medications).   Patient's responsibilities: 1. Pain Pills: Bring all pain pills to every appointment (except for procedure appointments). 2. Pill Bottles: Bring pills in original pharmacy bottle. Always bring the newest bottle. Bring bottle, even if empty. 3. Medication refills: You are responsible for knowing and keeping track of what medications you take and those you need refilled. The day before your appointment: write a list of all prescriptions that need to be refilled. The day of the appointment: give the list to the admitting nurse. Prescriptions will be written only during appointments. No prescriptions will be written on procedure days. If you forget a medication: it will not be "Called in", "Faxed", or "electronically sent". You will need to get another appointment to get these prescribed. No early refills. Do not call asking to have your prescription filled  early. 4. Prescription Accuracy: You are responsible for carefully inspecting your prescriptions before leaving our office. Have the discharge nurse carefully go over each prescription with you, before taking them home. Make sure that your name is accurately spelled, that your address is correct. Check the name and dose of your medication to make sure it is accurate. Check the number of pills, and the written instructions to make sure they are clear and accurate. Make sure that you are given enough medication to last until your next medication refill appointment. 5. Taking Medication: Take medication as prescribed. When it comes to controlled substances, taking less pills or less frequently than prescribed is permitted and encouraged. Never take more pills than instructed. Never take medication more frequently than prescribed.  6. Inform other Doctors: Always inform, all of your healthcare providers, of all the medications you take. 7. Pain Medication from other Providers: You are not allowed to accept any additional pain medication from any other Doctor or Healthcare provider. There are two exceptions to this rule. (see below) In the event that you require additional pain medication, you are responsible for notifying us, as stated below. 8. Medication Agreement: You are responsible for carefully reading and following our Medication Agreement. This must be signed before receiving any prescriptions from our practice. Safely store a copy of your signed Agreement. Violations to the Agreement will result in no further prescriptions. (Additional copies of our Medication Agreement are available upon request.) 9. Laws, Rules, & Regulations: All patients are expected to follow all Federal and State Laws, Statutes, Rules, & Regulations. Ignorance of the Laws does not constitute a valid excuse. The use of any illegal substances is prohibited. 10. Adopted CDC guidelines & recommendations: Target dosing levels will be  at or below 60 MME/day. Use of benzodiazepines** is not recommended.  Exceptions: There are only two exceptions to the rule of not   receiving pain medications from other Healthcare Providers. 1. Exception #1 (Emergencies): In the event of an emergency (i.e.: accident requiring emergency care), you are allowed to receive additional pain medication. However, you are responsible for: As soon as you are able, call our office [phone], at any time of the day or night, and leave a message stating your name, the date and nature of the emergency, and the name and dose of the medication prescribed. In the event that your call is answered by a member of our staff, make sure to document and save the date, time, and the name of the person that took your information.  2. Exception #2 (Planned Surgery): In the event that you are scheduled by another doctor or dentist to have any type of surgery or procedure, you are allowed (for a period no longer than 30 days), to receive additional pain medication, for the acute post-op pain. However, in this case, you are responsible for picking up a copy of our "Post-op Pain Management for Surgeons" handout, and giving it to your surgeon or dentist. This document is available at our office, and does not require an appointment to obtain it. Simply go to our office during business hours (Monday-Thursday from 8:00 AM to 4:00 PM) (Friday 8:00 AM to 12:00 Noon) or if you have a scheduled appointment with us, prior to your surgery, and ask for it by name. In addition, you will need to provide us with your name, name of your surgeon, type of surgery, and date of procedure or surgery.  *Opioid medications include: morphine, codeine, oxycodone, oxymorphone, hydrocodone, hydromorphone, meperidine, tramadol, tapentadol, buprenorphine, fentanyl, methadone. **Benzodiazepine medications include: diazepam (Valium), alprazolam (Xanax), clonazepam (Klonopine), lorazepam (Ativan), clorazepate  (Tranxene), chlordiazepoxide (Librium), estazolam (Prosom), oxazepam (Serax), temazepam (Restoril), triazolam (Halcion) (Last updated: 08/28/2017) ____________________________________________________________________________________________   ____________________________________________________________________________________________  Medication Recommendations and Reminders  Applies to: All patients receiving prescriptions (written and/or electronic).  Medication Rules & Regulations: These rules and regulations exist for your safety and that of others. They are not flexible and neither are we. Dismissing or ignoring them will be considered "non-compliance" with medication therapy, resulting in complete and irreversible termination of such therapy. (See document titled "Medication Rules" for more details.) In all conscience, because of safety reasons, we cannot continue providing a therapy where the patient does not follow instructions.  Pharmacy of record:   Definition: This is the pharmacy where your electronic prescriptions will be sent.   We do not endorse any particular pharmacy.  You are not restricted in your choice of pharmacy.  The pharmacy listed in the electronic medical record should be the one where you want electronic prescriptions to be sent.  If you choose to change pharmacy, simply notify our nursing staff of your choice of new pharmacy.  Recommendations:  Keep all of your pain medications in a safe place, under lock and key, even if you live alone.   After you fill your prescription, take 1 week's worth of pills and put them away in a safe place. You should keep a separate, properly labeled bottle for this purpose. The remainder should be kept in the original bottle. Use this as your primary supply, until it runs out. Once it's gone, then you know that you have 1 week's worth of medicine, and it is time to come in for a prescription refill. If you do this correctly, it  is unlikely that you will ever run out of medicine.  To make sure that the above recommendation works,   it is very important that you make sure your medication refill appointments are scheduled at least 1 week before you run out of medicine. To do this in an effective manner, make sure that you do not leave the office without scheduling your next medication management appointment. Always ask the nursing staff to show you in your prescription , when your medication will be running out. Then arrange for the receptionist to get you a return appointment, at least 7 days before you run out of medicine. Do not wait until you have 1 or 2 pills left, to come in. This is very poor planning and does not take into consideration that we may need to cancel appointments due to bad weather, sickness, or emergencies affecting our staff.  "Partial Fill": If for any reason your pharmacy does not have enough pills/tablets to completely fill or refill your prescription, do not allow for a "partial fill". You will need a separate prescription to fill the remaining amount, which we will not provide. If the reason for the partial fill is your insurance, you will need to talk to the pharmacist about payment alternatives for the remaining tablets, but again, do not accept a partial fill.  Prescription refills and/or changes in medication(s):   Prescription refills, and/or changes in dose or medication, will be conducted only during scheduled medication management appointments. (Applies to both, written and electronic prescriptions.)  No refills on procedure days. No medication will be changed or started on procedure days. No changes, adjustments, and/or refills will be conducted on a procedure day. Doing so will interfere with the diagnostic portion of the procedure.  No phone refills. No medications will be "called into the pharmacy".  No Fax refills.  No weekend refills.  No Holliday refills.  No after hours  refills.  Remember:  Business hours are:  Monday to Thursday 8:00 AM to 4:00 PM Provider's Schedule: Annette Bertelson, MD - Appointments are:  Medication management: Monday and Wednesday 8:00 AM to 4:00 PM Procedure day: Tuesday and Thursday 7:30 AM to 4:00 PM Bilal Lateef, MD - Appointments are:  Medication management: Tuesday and Thursday 8:00 AM to 4:00 PM Procedure day: Monday and Wednesday 7:30 AM to 4:00 PM (Last update: 08/28/2017) ____________________________________________________________________________________________    

## 2019-03-31 DIAGNOSIS — M47817 Spondylosis without myelopathy or radiculopathy, lumbosacral region: Secondary | ICD-10-CM | POA: Insufficient documentation

## 2019-03-31 DIAGNOSIS — I10 Essential (primary) hypertension: Secondary | ICD-10-CM | POA: Insufficient documentation

## 2019-06-16 ENCOUNTER — Encounter: Payer: Self-pay | Admitting: Pain Medicine

## 2019-06-20 NOTE — Progress Notes (Signed)
NOTE: Information contained herein reflects provider's review and annotations entered in association with encounter. Patient information is provided elsewhere in the medical record. Interpretation of information and data should be left to medically trained personnel. Virtual Encounter - Pain Management   Contact & Pharmacy Preferred: [phone] Home: [phone] (home) Mobile: [phone] (mobile) E-mail: [email or web address]  [id number] - [address] ([address]), [place] - [address] [place] [address] Phone: [phone] Fax: [phone]    Pre-screening  Jacqueline Thornton offered "in-person" vs "virtual" encounter. She indicated preferring virtual for this encounter.   Reason COVID-19*  Social distancing based on CDC and AMA recommendations.   I contacted [phone] on 06/21/2019 via telephone.      I clearly identified myself as 07/04/2019, MD. I verified that I was speaking with the correct person using two identifiers (Name: Jacqueline Thornton, and date of birth: [birth year]).  Consent I sought verbal advanced consent from 03/07/1951 for virtual visit interactions. I informed Jacqueline Thornton of possible security and privacy concerns, risks, and limitations associated with providing "not-in-person" medical evaluation and management services. I also informed Jacqueline Thornton of the availability of "in-person" appointments. Finally, I informed her that there would be a charge for the virtual visit and that she could be  personally, fully or partially, financially responsible for it. Jacqueline Thornton expressed understanding and agreed to proceed.   Historic Elements   Jacqueline Thornton is a 68 y.o. year old, female patient evaluated today after her last encounter by our practice on 03/22/2019. 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 She also  has a past surgical history that includes Cholecystectomy; Abdominal hysterectomy; Neck surgery; Shoulder surgery; Colonoscopy with propofol (N/A, 07/19/2015); caract surger; Laparoscopic salpingo oophorectomy (Bilateral, 03/03/2018); Cystoscopy (03/03/2018); Tonsillectomy; Appendectomy; Tumor removal; Anterior lumbar fusion (N/A, 12/11/2018); and Abdominal exposure (N/A, 12/11/2018). Jacqueline Thornton has a current medication list which includes the following prescription(s): vitamin d3, vitamin b-12, duloxetine, enalapril, esomeprazole, estradiol, gabapentin, hydrochlorothiazide, melatonin, methocarbamol, oxycodone-acetaminophen, [START ON 07/21/2019] oxycodone-acetaminophen, [START ON 08/20/2019] oxycodone-acetaminophen, xarelto, rosuvastatin, sertraline, and cyclobenzaprine. She  reports that she quit smoking about 15 years ago. She has never used smokeless tobacco. She reports that she does not drink alcohol or use drugs. Jacqueline Thornton has No Known Allergies.   HPI  Today, she is being contacted for medication management. The patient indicates doing well with the current medication regimen. No adverse reactions or side effects reported to the medications. The patient indicates having initially told the nursing staff that she was not taking the Flexeril, but as it turns out, she was.  She admitted being confused with the generic name.  Today she confirmed to me that she is still  taking the medication.  Pharmacotherapy Assessment  Analgesic: Oxycodone/APAP 7.5/325, 1 tab PO q 5 times a day. (56.25 mg/day of oxycodone) MME/day:56.25 mg/day.   Monitoring: Pharmacotherapy: No side-effects or adverse reactions reported. [place] PMP: PDMP reviewed during this encounter.       Compliance: No problems identified. Effectiveness: Clinically  acceptable. Plan: Refer to "POC".  UDS:  Summary  Date Value Ref Range Status  06/09/2018 FINAL  Final    Comment:    ==================================================================== TOXASSURE SELECT 13 (MW) ==================================================================== Test                             Result       Flag       Units Drug Present and Declared for Prescription Verification   Oxycodone                      3546         EXPECTED   ng/mg creat   Oxymorphone                    1252         EXPECTED   ng/mg creat   Noroxycodone                   5124         EXPECTED   ng/mg creat   Noroxymorphone                 405          EXPECTED   ng/mg creat    Sources of oxycodone are scheduled prescription medications.    Oxymorphone, noroxycodone, and noroxymorphone are expected    metabolites of oxycodone. Oxymorphone is also available as a    scheduled prescription medication. ==================================================================== Test                      Result    Flag   Units      Ref Range   Creatinine              85               mg/dL      >=20 ==================================================================== Declared Medications:  The flagging and interpretation on this report are based on the  following declared medications.  Unexpected results may arise from  inaccuracies in the declared medications.  **Note: The testing scope of this panel includes these medications:  Oxycodone  Oxycodone (Oxycodone Acetaminophen)  **Note: The testing scope of this panel does not include following   reported medications:  Acetaminophen (Oxycodone Acetaminophen)  Cholecalciferol  Cyanocobalamin  Cyclobenzaprine  Duloxetine  Enalapril  Esomeprazole  Gabapentin  Ibuprofen (Advil)  Melatonin  Rosuvastatin  Topical ==================================================================== For clinical consultation, please call [phone]. ====================================================================    Laboratory Chemistry Profile (12 mo)  Renal: 12/10/2018: BUN 7; Creatinine, Ser 1.00  Lab Results  Component Value Date   GFRAA >60 12/10/2018   GFRNONAA 59 (L) 12/10/2018   Hepatic: No results found for requested labs within last 8760 hours. Lab Results  Component Value Date   AST 14 (L) 03/08/2018   ALT 14 03/08/2018   Other: No results found for requested labs within last 8760 hours. Note: Above Lab results reviewed.  Imaging  DG Lumbar Spine 2-3 Views CLINICAL DATA:  Lumbar fusion  EXAM: DG C-ARM 61-120 MIN; LUMBAR SPINE - 2-3 VIEW  COMPARISON:  MRI from 08/18/2018  FLUOROSCOPY TIME:  Radiation Exposure Index (as provided by the fluoroscopic device): Not available  If the device does not provide the exposure index:  Fluoroscopy Time:  41 seconds  Number of Acquired Images:  2  FINDINGS: Fusion at L5-S1 is noted with anterior fixation.  No acute bony abnormality is noted.  IMPRESSION: L5-S1 fusion anteriorly.  Electronically Signed   By: Inez Catalina M.D.   On: 12/11/2018 10:24 DG C-Arm 1-60 Min CLINICAL DATA:  Lumbar fusion  EXAM: DG C-ARM 61-120 MIN; LUMBAR SPINE - 2-3 VIEW  COMPARISON:  MRI from 08/18/2018  FLUOROSCOPY TIME:  Radiation Exposure Index (as provided by the fluoroscopic device): Not available  If the device does not provide the exposure index:  Fluoroscopy Time:  41 seconds  Number of Acquired Images:  2  FINDINGS: Fusion at L5-S1 is noted with anterior fixation. No acute bony abnormality is  noted.  IMPRESSION: L5-S1 fusion anteriorly.  Electronically Signed   By: Inez Catalina M.D.   On: 12/11/2018 10:24 DG OR LOCAL ABDOMEN CLINICAL DATA:  Status post anterior lumbar fusion  EXAM: OR LOCAL ABDOMEN  COMPARISON:  None.  FINDINGS: Scattered large and small bowel gas is noted. Some air is noted in the left hemipelvis related to the recent surgery. Fusion at L5-S1 is noted anteriorly. No radiopaque foreign body is seen.  IMPRESSION: Postsurgical changes without radiopaque foreign body.  Critical Value/emergent results were called by telephone at the time of interpretation on 12/11/2018 at 10:10 am to Dr. Erline Levine , who verbally acknowledged these results.  Electronically Signed   By: Inez Catalina M.D.   On: 12/11/2018 10:10   Assessment  The primary encounter diagnosis was Chronic pain syndrome. Diagnoses of Chronic lumbar radicular pain (Primary Area of Pain) (Left) (S1 Dermatome), Chronic low back pain (Secondary area of Pain) (Bilateral) (midline to tailbone) (R>L), Anemia due to vitamin B12 deficiency, unspecified B12 deficiency type, Vitamin B12 deficiency, Neurogenic pain, and Myofascial pain were also pertinent to this visit.  Plan of Care  Problem-specific:  No problem-specific Assessment & Plan notes found for this encounter.  I am having Joelie L. Thornton start on oxyCODONE-acetaminophen and oxyCODONE-acetaminophen. I am also having her maintain her esomeprazole, enalapril, DULoxetine, rosuvastatin, Melatonin, Vitamin D3, estradiol, hydrochlorothiazide, methocarbamol, Xarelto, sertraline, oxyCODONE-acetaminophen, Vitamin B-12, gabapentin, and cyclobenzaprine.  Pharmacotherapy (Medications Ordered): Meds ordered this encounter  Medications  . oxyCODONE-acetaminophen (PERCOCET) 7.5-325 MG tablet    Sig: Take 1 tablet by mouth 5 (five) times daily. Must last 30 days    Dispense:  150 tablet    Refill:  0    Chronic Pain: STOP Act (Not applicable)  Fill 1 day early if closed on refill date. Do not fill until: 06/21/2019. To last until: 07/21/2019. Avoid benzodiazepines within 8 hours of opioids  . oxyCODONE-acetaminophen (PERCOCET) 7.5-325 MG tablet    Sig: Take 1 tablet by mouth 5 (five) times daily. Must last 30 days    Dispense:  150 tablet    Refill:  0    Chronic Pain: STOP Act (Not applicable) Fill 1 day early if closed on refill date. Do not fill until: 07/21/2019. To last until: 08/20/2019. Avoid benzodiazepines within 8 hours of opioids  . oxyCODONE-acetaminophen (PERCOCET) 7.5-325 MG tablet    Sig: Take 1 tablet by mouth 5 (five) times daily. Must last 30 days    Dispense:  150 tablet    Refill:  0    Chronic Pain: STOP Act (Not applicable) Fill 1 day early if closed on refill date. Do not fill until: 08/20/2019. To last until: 09/19/2019. Avoid benzodiazepines within 8 hours of opioids  . Cyanocobalamin (VITAMIN B-12) 5000 MCG SUBL    Sig: Place 1 tablet (5,000 mcg total) under the tongue daily.    Dispense:  90 tablet    Refill:  3    Fill one day early if pharmacy is closed on scheduled refill date. May substitute for generic if available.  . gabapentin (NEURONTIN) 300 MG capsule    Sig: Take 1 capsule (300 mg total) by mouth 2 (two) times daily.    Dispense:  60 capsule    Refill:  5    Fill one day early if pharmacy is closed on scheduled refill date. May substitute for generic if available.  . cyclobenzaprine (FLEXERIL) 10 MG tablet    Sig: Take 1 tablet (10 mg total) by mouth 3 (three) times daily as needed for muscle spasms.    Dispense:  90 tablet    Refill:  5    Fill one day early if pharmacy is closed on scheduled refill date. May substitute for generic if available.   Orders:  No orders of the defined types were placed in this encounter.  Follow-up plan:   Return in about 3 months (around 09/15/2019) for (VV), (MM).      Interventional management options:  Considering:   NOTE: XARELTO ANTICOAGULATION. (Stop:  3 days  Restart: 6 hrs) Diagnostic caudal epidural steroid injection + diagnostic epidurogram Diagnostic CESI Diagnostic bilateral cervical facet block Possible bilateral cervical facet RFA  Diagnostic bilateral lumbar facet block Possible bilateral lumbar facet RFA  Diagnostic left L4-5 LESI  Diagnostic left S1 SNRB Diagnostic bilateral SI joint block Possible bilateral SI joint RFA  Diagnostic right trochanteric bursa injection   Palliative PRN treatment(s):   Palliativebilateral lumbar facet block #3+bilateral sacroiliac joint block#6 PalliativeCESI  Palliativebilateral cervical facet block Palliativeleft L4-5 LESI  Palliativeleft caudal ESI  Palliativeleft S1 SNRB Palliativebilateral SI joint block Palliativeright trochanteric bursa injection     Recent Visits No visits were found meeting these conditions.  Showing recent visits within past 90 days and meeting all other requirements   Today's Visits Date Type Provider Dept  06/21/19 Telemedicine Milinda Pointer, MD [facility]  Showing today's visits and meeting all other requirements   Future Appointments No visits were found meeting these conditions.  Showing future appointments within next 90 days and meeting all other requirements   I discussed the assessment and treatment plan with the patient. The patient was provided an opportunity to ask questions and all were answered. The patient agreed with the plan and demonstrated an understanding of the instructions.  Patient advised to call back or seek an in-person evaluation if the symptoms or condition worsens.  Total duration of non-face-to-face encounter: 15 minutes.  Note by: Gaspar Cola, MD Date: 06/21/2019; Time: 11:51 AM  Note: This dictation was prepared with Dragon dictation. Any transcriptional errors that may result from this process are unintentional.

## 2019-06-21 ENCOUNTER — Ambulatory Visit: Payer: Medicare Other | Attending: Pain Medicine | Admitting: Pain Medicine

## 2019-06-21 ENCOUNTER — Other Ambulatory Visit: Payer: Self-pay

## 2019-06-21 DIAGNOSIS — M545 Low back pain: Secondary | ICD-10-CM

## 2019-06-21 DIAGNOSIS — M7918 Myalgia, other site: Secondary | ICD-10-CM

## 2019-06-21 DIAGNOSIS — E538 Deficiency of other specified B group vitamins: Secondary | ICD-10-CM

## 2019-06-21 DIAGNOSIS — G894 Chronic pain syndrome: Secondary | ICD-10-CM | POA: Diagnosis not present

## 2019-06-21 DIAGNOSIS — D519 Vitamin B12 deficiency anemia, unspecified: Secondary | ICD-10-CM

## 2019-06-21 DIAGNOSIS — M5416 Radiculopathy, lumbar region: Secondary | ICD-10-CM

## 2019-06-21 DIAGNOSIS — M792 Neuralgia and neuritis, unspecified: Secondary | ICD-10-CM

## 2019-06-21 DIAGNOSIS — G8929 Other chronic pain: Secondary | ICD-10-CM

## 2019-06-21 MED ORDER — VITAMIN B-12 5000 MCG SL SUBL: 5000.0000 ug | SUBLINGUAL_TABLET | Freq: Every day | SUBLINGUAL | 3 refills | Status: DC

## 2019-06-21 MED ORDER — CYCLOBENZAPRINE HCL 10 MG PO TABS: 10.0000 mg | ORAL_TABLET | Freq: Three times a day (TID) | ORAL | 5 refills | Status: DC | PRN

## 2019-06-21 MED ORDER — OXYCODONE-ACETAMINOPHEN 7.5-325 MG PO TABS: 1.0000 | ORAL_TABLET | Freq: Every day | ORAL | 0 refills | Status: DC

## 2019-06-21 MED ORDER — GABAPENTIN 300 MG PO CAPS: 300.0000 mg | ORAL_CAPSULE | Freq: Two times a day (BID) | ORAL | 5 refills | Status: DC

## 2019-09-14 ENCOUNTER — Encounter: Payer: Self-pay | Admitting: Pain Medicine

## 2019-09-14 NOTE — Progress Notes (Signed)
Patient: Jacqueline Thornton  Service Category: E/M  Provider: Gaspar Cola, MD  DOB: [birth year]  DOS: 09/15/2019  Location: Office  MRN: [id number]  Setting: Ambulatory outpatient  Referring Provider: Perrin Maltese, MD  Type: Established Patient  Specialty: Interventional Pain Management  PCP: Perrin Maltese, MD  Location: Remote location  Delivery: TeleHealth     Virtual Encounter - Pain Management PROVIDER NOTE: Information contained herein reflects review and annotations entered in association with encounter. Interpretation of such information and data should be left to medically-trained personnel. Information provided to patient can be located elsewhere in the medical record under "Patient Instructions". Document created using STT-dictation technology, any transcriptional errors that may result from process are unintentional.    Contact & Pharmacy Preferred: [phone] Home: [phone] (home) Mobile: [phone] (mobile) E-mail: [email or web address]  [facility] [address] [place] [address] Phone: [phone] Fax: [phone]   Pre-screening  Jacqueline Thornton offered "in-person" vs "virtual" encounter. She indicated preferring virtual for this encounter.   Reason COVID-19*  Social distancing based on CDC and AMA recommendations.   I contacted Jacqueline Thornton on 09/15/2019 via telephone.      I clearly identified myself as Gaspar Cola, MD. I verified that I was speaking with the correct person using two identifiers (Name: Jacqueline Thornton, and date of birth: June 05, 1951).  Consent I sought verbal advanced consent from Jacqueline Thornton for virtual visit interactions. I informed Jacqueline Thornton of possible security and privacy concerns, risks, and limitations associated with providing "not-in-person" medical evaluation and management services. I also informed Jacqueline Thornton of the availability  of "in-person" appointments. Finally, I informed her that there would be a charge for the virtual visit and that she could be  personally, fully or partially, financially responsible for it. Jacqueline Thornton expressed understanding and agreed to proceed.   Historic Elements   Jacqueline Thornton is a 69 y.o. year old, female patient evaluated today after her last contact with our practice on 03/22/2019. 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 She also  has a past surgical history that includes Cholecystectomy; Abdominal hysterectomy; Neck surgery; Shoulder surgery; Colonoscopy with propofol (N/A, 07/19/2015); caract surger; Laparoscopic salpingo oophorectomy (Bilateral, 03/03/2018); Cystoscopy (03/03/2018); Tonsillectomy; Appendectomy; Tumor removal; Anterior lumbar fusion (N/A, 12/11/2018); and Abdominal exposure (N/A, 12/11/2018). Jacqueline Thornton has a current medication list which includes the following prescription(s): vitamin d3, vitamin b-12, cyclobenzaprine, duloxetine, enalapril, esomeprazole, estradiol, gabapentin, hydrochlorothiazide, melatonin, [START ON 09/19/2019] oxycodone-acetaminophen, [START ON 10/19/2019] oxycodone-acetaminophen, [START ON 11/18/2019] oxycodone-acetaminophen, xarelto, rosuvastatin, and sertraline. She  reports that she quit smoking about 16 years ago. She has never used  smokeless tobacco. She reports that she does not drink alcohol or use drugs. Jacqueline Thornton has No Known Allergies.   HPI  Today, she is being contacted for medication management. The patient indicates doing well with the current medication regimen. No adverse reactions or side effects reported to the medications.   According to the patient, she is doing  okay with her medications, but her back pain seems to be worse.  She recently underwent back surgery around December 11, 2018 with Dr. Erline Levine.  She indicates that she is still experiencing bilateral low back pain with the left being worse than the right.  She is on Xarelto which she had to start around October of last year secondary to a left lower extremity DVT.  Subsequent follow-up ultrasounds have revealed that the blood clot is now gone, but she still has swelling of her left foot.  She is not entirely sure if the pain is from her DVT since she still have pain in the left lower extremity that goes to the top of the foot and the big toe and what seems to be an L5 dermatomal distribution.  She mentions that she is not entirely sure if the toe pain is secondary to the back problem since she has had problems with that toe for many years and she was seen by Dr. Lyndon Code who referred her to the podiatrist who in turn indicated that she had bone-on-bone on that joint and recommended surgery, but she has indicated that she is trying to avoid that.  She was worked up for gout, but she was negative.  She indicates having an appointment with Dr. Vertell Limber this afternoon and they will talk about this low back pain and if there is nothing that he can do, I instructed the patient to give me a call to see if there are some interventional therapies that we may be able to offer her to bring this pain down.  She understood and accepted.   Pharmacotherapy Assessment  Analgesic: Oxycodone/APAP 7.5/325, 1 tab PO q 5 times a day. (56.25 mg/day of  oxycodone) MME/day:56.25 mg/day.   Monitoring: [place] PMP: PDMP reviewed during this encounter.       Pharmacotherapy: No side-effects or adverse reactions reported. Compliance: No problems identified. Effectiveness: Clinically acceptable. Plan: Refer to "POC".  UDS:  Summary  Date Value Ref Range Status  06/09/2018 FINAL  Final    Comment:    ==================================================================== TOXASSURE SELECT 13 (MW) ==================================================================== Test                             Result       Flag       Units Drug Present and Declared for Prescription Verification   Oxycodone                      3546         EXPECTED   ng/mg creat   Oxymorphone                    1252         EXPECTED   ng/mg creat   Noroxycodone                   5124         EXPECTED   ng/mg creat   Noroxymorphone                 405          EXPECTED   ng/mg creat    Sources of oxycodone are scheduled prescription medications.    Oxymorphone, noroxycodone, and noroxymorphone are expected    metabolites of oxycodone. Oxymorphone is also available as a    scheduled prescription medication. ==================================================================== Test  Result    Flag   Units      Ref Range   Creatinine              85               mg/dL      >=20 ==================================================================== Declared Medications:  The flagging and interpretation on this report are based on the  following declared medications.  Unexpected results may arise from  inaccuracies in the declared medications.  **Note: The testing scope of this panel includes these medications:  Oxycodone  Oxycodone (Oxycodone Acetaminophen)  **Note: The testing scope of this panel does not include following  reported medications:  Acetaminophen (Oxycodone Acetaminophen)  Cholecalciferol  Cyanocobalamin  Cyclobenzaprine  Duloxetine   Enalapril  Esomeprazole  Gabapentin  Ibuprofen (Advil)  Melatonin  Rosuvastatin  Topical ==================================================================== For clinical consultation, please call [phone]. ====================================================================    Laboratory Chemistry Profile   Renal Lab Results  Component Value Date   BUN 7 (L) 12/10/2018   CREATININE 1.00 12/10/2018   BCR 9 (L) 12/05/2016   GFRAA >60 12/10/2018   GFRNONAA 59 (L) 12/10/2018    Hepatic Lab Results  Component Value Date   AST 14 (L) 03/08/2018   ALT 14 03/08/2018   ALBUMIN 3.3 (L) 03/08/2018   ALKPHOS 41 03/08/2018    Electrolytes Lab Results  Component Value Date   NA 139 12/10/2018   K 3.8 12/10/2018   CL 104 12/10/2018   CALCIUM 9.0 12/10/2018   MG 2.2 12/05/2016    Bone Lab Results  Component Value Date   25OHVITD1 50 12/05/2016   25OHVITD2 <1.0 12/05/2016   25OHVITD3 49 12/05/2016    Inflammation (CRP: Acute Phase) (ESR: Chronic Phase) Lab Results  Component Value Date   CRP 4.8 12/05/2016   ESRSEDRATE 2 12/05/2016      Note: Above Lab results reviewed.  Imaging  DG Lumbar Spine 2-3 Views CLINICAL DATA:  Lumbar fusion  EXAM: DG C-ARM 61-120 MIN; LUMBAR SPINE - 2-3 VIEW  COMPARISON:  MRI from 08/18/2018  FLUOROSCOPY TIME:  Radiation Exposure Index (as provided by the fluoroscopic device): Not available  If the device does not provide the exposure index:  Fluoroscopy Time:  41 seconds  Number of Acquired Images:  2  FINDINGS: Fusion at L5-S1 is noted with anterior fixation. No acute bony abnormality is noted.  IMPRESSION: L5-S1 fusion anteriorly.  Electronically Signed   By: Inez Catalina M.D.   On: 12/11/2018 10:24 DG C-Arm 1-60 Min CLINICAL DATA:  Lumbar fusion  EXAM: DG C-ARM 61-120 MIN; LUMBAR SPINE - 2-3 VIEW  COMPARISON:  MRI from 08/18/2018  FLUOROSCOPY TIME:  Radiation Exposure Index (as provided by the fluoroscopic  device): Not available  If the device does not provide the exposure index:  Fluoroscopy Time:  41 seconds  Number of Acquired Images:  2  FINDINGS: Fusion at L5-S1 is noted with anterior fixation. No acute bony abnormality is noted.  IMPRESSION: L5-S1 fusion anteriorly.  Electronically Signed   By: Inez Catalina M.D.   On: 12/11/2018 10:24 DG OR LOCAL ABDOMEN CLINICAL DATA:  Status post anterior lumbar fusion  EXAM: OR LOCAL ABDOMEN  COMPARISON:  None.  FINDINGS: Scattered large and small bowel gas is noted. Some air is noted in the left hemipelvis related to the recent surgery. Fusion at L5-S1 is noted anteriorly. No radiopaque foreign body is seen.  IMPRESSION: Postsurgical changes without radiopaque foreign body.  Critical Value/emergent results were called by  telephone at the time of interpretation on 12/11/2018 at 10:10 am to Dr. Erline Levine , who verbally acknowledged these results.  Electronically Signed   By: Inez Catalina M.D.   On: 12/11/2018 10:10  Assessment  The primary encounter diagnosis was Chronic pain syndrome. Diagnoses of Chronic lumbar radicular pain (Primary Area of Pain) (Left) (S1 Dermatome), Chronic low back pain (Secondary area of Pain) (Bilateral) (midline to tailbone) (R>L), History of lumbar spinal fusion (Dr. Erline Levine) (12/11/2018), Failed back surgical syndrome, and Chronic anticoagulation (Xarelto) were also pertinent to this visit.  Plan of Care  Problem-specific:  No problem-specific Assessment & Plan notes found for this encounter.  Jacqueline Thornton has a current medication list which includes the following long-term medication(s): vitamin b-12, cyclobenzaprine, duloxetine, enalapril, esomeprazole, gabapentin, hydrochlorothiazide, [START ON 09/19/2019] oxycodone-acetaminophen, [START ON 10/19/2019] oxycodone-acetaminophen, [START ON 11/18/2019] oxycodone-acetaminophen, xarelto, and sertraline.  Pharmacotherapy (Medications  Ordered): Meds ordered this encounter  Medications  . oxyCODONE-acetaminophen (PERCOCET) 7.5-325 MG tablet    Sig: Take 1 tablet by mouth 5 (five) times daily. Must last 30 days    Dispense:  150 tablet    Refill:  0    Chronic Pain: STOP Act (Not applicable) Fill 1 day early if closed on refill date. Do not fill until: 09/19/2019. To last until: 10/19/2019. Avoid benzodiazepines within 8 hours of opioids  . oxyCODONE-acetaminophen (PERCOCET) 7.5-325 MG tablet    Sig: Take 1 tablet by mouth 5 (five) times daily. Must last 30 days    Dispense:  150 tablet    Refill:  0    Chronic Pain: STOP Act (Not applicable) Fill 1 day early if closed on refill date. Do not fill until: 10/19/2019. To last until: 11/18/2019. Avoid benzodiazepines within 8 hours of opioids  . oxyCODONE-acetaminophen (PERCOCET) 7.5-325 MG tablet    Sig: Take 1 tablet by mouth 5 (five) times daily. Must last 30 days    Dispense:  150 tablet    Refill:  0    Chronic Pain: STOP Act (Not applicable) Fill 1 day early if closed on refill date. Do not fill until: 11/18/2019. To last until: 12/18/2019. Avoid benzodiazepines within 8 hours of opioids   Orders:  Orders Placed This Encounter  Procedures  . Blood Thinner Instructions to Nursing    If unable to stop, ask if Lovenox-bridge therapy may be possible, and if so, request their assistance in implementing it.    Scheduling Instructions:     Contact the physician prescribing the blood thinner and request clearance to stop it for time period stipulated below.     If approved by prescribing physician, stop Xarelto (Rivaroxaban) x 3 days prior to procedure or surgery.  . Blood Thinner Instructions to Nursing    If the patient requires a Lovenox-bridge therapy, make sure arrangements are made to institute it with the assistance of the PCP.    Standing Status:   Standing    Number of Occurrences:   36    Standing Expiration Date:   03/17/2021    Scheduling Instructions:     Always  stop the Xarelto (Rivaroxaban) x 3 days prior to procedure or surgery.   Follow-up plan:   Return in about 13 weeks (around 12/15/2019) for (VV), (MM).      Interventional management options:  Considering:   NOTE: XARELTO ANTICOAGULATION. (Stop: 3 days  Restart: 6 hrs) Diagnostic caudal epidural steroid injection + diagnostic epidurogram Diagnostic CESI Diagnostic bilateral cervical facet block Possible bilateral cervical facet  RFA  Diagnostic bilateral lumbar facet block Possible bilateral lumbar facet RFA  Diagnostic left L4-5 LESI  Diagnostic left S1 SNRB Diagnostic bilateral SI joint block Possible bilateral SI joint RFA  Diagnostic right trochanteric bursa injection   Palliative PRN treatment(s):   Palliativebilateral lumbar facet block #3+bilateral sacroiliac joint block#6 PalliativeCESI  Palliativebilateral cervical facet block Palliativeleft L4-5 LESI  Palliativeleft caudal ESI  Palliativeleft S1 SNRB Palliativebilateral SI joint block Palliativeright trochanteric bursa injection      Recent Visits Date Type Provider Dept  06/21/19 Telemedicine Milinda Pointer, MD [facility]  Showing recent visits within past 90 days and meeting all other requirements   Today's Visits Date Type Provider Dept  09/15/19 Telemedicine Milinda Pointer, MD [facility]  Showing today's visits and meeting all other requirements   Future Appointments No visits were found meeting these conditions.  Showing future appointments within next 90 days and meeting all other requirements   I discussed the assessment and treatment plan with the patient. The patient was provided an opportunity to ask questions and all were answered. The patient agreed with the plan and demonstrated an understanding of the instructions.  Patient advised to call back or seek an in-person evaluation if the symptoms or condition worsens.  Duration of encounter: 20  minutes.  Note by: Gaspar Cola, MD Date: 09/15/2019; Time: 2:01 PM

## 2019-09-15 ENCOUNTER — Other Ambulatory Visit: Payer: Self-pay

## 2019-09-15 ENCOUNTER — Ambulatory Visit: Payer: Medicare Other | Attending: Pain Medicine | Admitting: Pain Medicine

## 2019-09-15 DIAGNOSIS — Z981 Arthrodesis status: Secondary | ICD-10-CM

## 2019-09-15 DIAGNOSIS — G894 Chronic pain syndrome: Secondary | ICD-10-CM | POA: Diagnosis not present

## 2019-09-15 DIAGNOSIS — M5416 Radiculopathy, lumbar region: Secondary | ICD-10-CM

## 2019-09-15 DIAGNOSIS — G8929 Other chronic pain: Secondary | ICD-10-CM

## 2019-09-15 DIAGNOSIS — M545 Low back pain: Secondary | ICD-10-CM

## 2019-09-15 DIAGNOSIS — Z7901 Long term (current) use of anticoagulants: Secondary | ICD-10-CM

## 2019-09-15 DIAGNOSIS — M961 Postlaminectomy syndrome, not elsewhere classified: Secondary | ICD-10-CM

## 2019-09-15 MED ORDER — OXYCODONE-ACETAMINOPHEN 7.5-325 MG PO TABS: 1.0000 | ORAL_TABLET | Freq: Every day | ORAL | 0 refills | Status: DC

## 2019-09-15 NOTE — Patient Instructions (Signed)
____________________________________________________________________________________________  Blood Thinners  IMPORTANT NOTICE:  If you take any of these, make sure to notify the nursing staff.  Failure to do so may result in injury.  Recommended time intervals to stop and restart blood-thinners, before & after invasive procedures  Generic Name Brand Name Stop Time. Must be stopped at least this long before procedures. After procedures, wait at least this long before re-starting.  Abciximab Reopro 15 days 2 hrs  Alteplase Activase 10 days 10 days  Anagrelide Agrylin    Apixaban Eliquis 3 days 6 hrs  Cilostazol Pletal 3 days 5 hrs  Clopidogrel Plavix 7-10 days 2 hrs  Dabigatran Pradaxa 5 days 6 hrs  Dalteparin Fragmin 24 hours 4 hrs  Dipyridamole Aggrenox 11days 2 hrs  Edoxaban Lixiana; Savaysa 3 days 2 hrs  Enoxaparin  Lovenox 24 hours 4 hrs  Eptifibatide Integrillin 8 hours 2 hrs  Fondaparinux  Arixtra 72 hours 12 hrs  Prasugrel Effient 7-10 days 6 hrs  Reteplase Retavase 10 days 10 days  Rivaroxaban Xarelto 3 days 6 hrs  Ticagrelor Brilinta 5-7 days 6 hrs  Ticlopidine Ticlid 10-14 days 2 hrs  Tinzaparin Innohep 24 hours 4 hrs  Tirofiban Aggrastat 8 hours 2 hrs  Warfarin Coumadin 5 days 2 hrs   Other medications with blood-thinning effects  Product indications Generic (Brand) names Note  Cholesterol Lipitor Stop 4 days before procedure  Blood thinner (injectable) Heparin (LMW or LMWH Heparin) Stop 24 hours before procedure  Cancer Ibrutinib (Imbruvica) Stop 7 days before procedure  Malaria/Rheumatoid Hydroxychloroquine (Plaquenil) Stop 11 days before procedure  Thrombolytics  10 days before or after procedures   Over-the-counter (OTC) Products with blood-thinning effects  Product Common names Stop Time  Aspirin > 325 mg Goody Powders, Excedrin, etc. 11 days  Aspirin ? 81 mg  7 days  Fish oil  4 days  Garlic supplements  7 days  Ginkgo biloba  36 hours  Ginseng  24  hours  NSAIDs Ibuprofen, Naprosyn, etc. 3 days  Vitamin E  4 days   ____________________________________________________________________________________________   

## 2019-10-19 ENCOUNTER — Telehealth: Payer: Self-pay | Admitting: Pain Medicine

## 2019-10-19 NOTE — Telephone Encounter (Signed)
[place], they informed me that the script for Percocet to be filled today is ready. Patient notified.

## 2019-10-19 NOTE — Telephone Encounter (Signed)
Patient lvmail stating she called [facility] to get scripts filled and doesn't have script for this month, it goes to next month. Please check on this.

## 2019-11-08 DIAGNOSIS — Z6829 Body mass index (BMI) 29.0-29.9, adult: Secondary | ICD-10-CM | POA: Insufficient documentation

## 2019-11-18 ENCOUNTER — Other Ambulatory Visit: Payer: Self-pay | Admitting: Sports Medicine

## 2019-11-18 DIAGNOSIS — M1711 Unilateral primary osteoarthritis, right knee: Secondary | ICD-10-CM

## 2019-11-18 DIAGNOSIS — G8929 Other chronic pain: Secondary | ICD-10-CM

## 2019-11-18 DIAGNOSIS — M25562 Pain in left knee: Secondary | ICD-10-CM

## 2019-12-04 ENCOUNTER — Other Ambulatory Visit: Payer: Self-pay

## 2019-12-04 ENCOUNTER — Ambulatory Visit
Admission: RE | Admit: 2019-12-04 | Discharge: 2019-12-04 | Disposition: A | Discharge status: Home / Self Care | Payer: Medicare Other | Source: Ambulatory Visit | Attending: Sports Medicine | Admitting: Sports Medicine

## 2019-12-04 DIAGNOSIS — G8929 Other chronic pain: Secondary | ICD-10-CM | POA: Insufficient documentation

## 2019-12-04 DIAGNOSIS — M25562 Pain in left knee: Secondary | ICD-10-CM | POA: Insufficient documentation

## 2019-12-04 DIAGNOSIS — M1711 Unilateral primary osteoarthritis, right knee: Secondary | ICD-10-CM

## 2019-12-04 DIAGNOSIS — M25561 Pain in right knee: Secondary | ICD-10-CM | POA: Insufficient documentation

## 2019-12-14 ENCOUNTER — Telehealth: Payer: Self-pay

## 2019-12-14 ENCOUNTER — Encounter: Payer: Self-pay | Admitting: Pain Medicine

## 2019-12-14 NOTE — Progress Notes (Signed)
Patient: Jacqueline Thornton  Service Category: E/M  Provider: Gaspar Cola, MD  DOB: Dec 10, 1950  DOS: 12/15/2019  Location: Office  MRN: [id number]  Setting: Ambulatory outpatient  Referring Provider: Perrin Maltese, MD  Type: Established Patient  Specialty: Interventional Pain Management  PCP: Perrin Maltese, MD  Location: Remote location  Delivery: TeleHealth     Virtual Encounter - Pain Management PROVIDER NOTE: Information contained herein reflects review and annotations entered in association with encounter. Interpretation of such information and data should be left to medically-trained personnel. Information provided to patient can be located elsewhere in the medical record under "Patient Instructions". Document created using STT-dictation technology, any transcriptional errors that may result from process are unintentional.    Contact & Pharmacy Preferred: [phone] Home: [phone] (home) Mobile: [phone] (mobile) E-mail: [email or web address]  [facility] [place] [facility] [place] [address]) [place] [address] Phone: [phone] Fax: [phone]   Pre-screening  Ms. Shark offered "in-person" vs "virtual" encounter. She indicated preferring virtual for this encounter.   Reason COVID-19*  Social distancing based on CDC and AMA recommendations.   I contacted Jacqueline Thornton on 12/15/2019 via telephone.      I clearly identified myself as Gaspar Cola, MD. I verified that I was speaking with the correct person using two identifiers (Name: [name], and date of birth: September 27, 1950).  Consent I sought verbal advanced consent from Jacqueline Thornton for virtual visit interactions. I informed Ms. Creary of possible security and privacy concerns, risks, and limitations associated with providing "not-in-person" medical evaluation and management services. I also informed Ms. Sparks of the availability  of "in-person" appointments. Finally, I informed her that there would be a charge for the virtual visit and that she could be  personally, fully or partially, financially responsible for it. Ms. Ferryman expressed understanding and agreed to proceed.   Historic Elements   Jacqueline Thornton is a 69 y.o. year old, female patient evaluated today after her last contact with our practice on 12/14/2019. 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 She also  has a past surgical history that includes Cholecystectomy; Abdominal hysterectomy; Neck surgery; Shoulder surgery; Colonoscopy with propofol (N/A, 07/19/2015); caract surger; Laparoscopic salpingo oophorectomy (Bilateral, 03/03/2018); Cystoscopy (03/03/2018); Tonsillectomy; Appendectomy; Tumor removal; Anterior lumbar fusion (N/A, 12/11/2018); and Abdominal exposure (N/A, 12/11/2018). [name] has a current medication list which includes the following prescription(s): vitamin d3, vitamin b-12, [START ON 12/18/2019] cyclobenzaprine, duloxetine, enalapril, esomeprazole, estradiol, [START ON 12/18/2019] gabapentin, hydrochlorothiazide, melatonin, [START ON 12/18/2019] oxycodone-acetaminophen, [START ON 01/17/2020] oxycodone-acetaminophen, [START ON 02/16/2020] oxycodone-acetaminophen, xarelto, rosuvastatin, and sertraline. She  reports that she quit  smoking about 16 years ago. She has never used smokeless tobacco. She reports that she does not drink alcohol and does not use drugs. [name] has No Known Allergies.   HPI  Today, she is being contacted for medication management. The patient indicates doing well with the current medication regimen. No adverse reactions or side effects reported to the medications.  However, the patient indicates that she has been experiencing a lot more pain in the area of the left hip for which she went to the [facility] and Dr. Rosalia Hammers, DO told her that he thought she would need a hip injection and some x-rays.  However she indicates that he did not do either one of them.  He apparently is well aware that she is receiving controlled substances from [place] and that we can also offer her those treatments.  He apparently reminded the patient that she needed to probably come in to see [place] for that hip injection.  I confirmed this for the patient and today I have ordered x-rays of her left hip and her lumbar spine.  She had some x-rays of the lumbar spine in 2020, but they did not look at flexion and extension of the lumbar spine.  Currently she indicates having pain in the left hip and also in the lower back, bilaterally with the left being worse than the right.  She has lower extremity pain that is also bilateral, but more so on the left side.  She indicated that her left lower extremity pain goes down to her calf and into the lateral aspect of the foot and what appears to be an S1 dermatomal distribution.  However, she thinks that all the problems that she is having in her foot are exclusively related to her osteoarthritis.  I reminded the patient that there is always a possibility that she may not only have the osteoarthritis in her foot but she may also be suffering from a radiculitis/radiculopathy affecting that left side.  She indicated and confirmed having pain going all the way down into the big toe,  suggestive of an L5 radiculitis, as well as pain going to the lateral aspect of the foot and little toe, as well as pain in the bottom of the foot.  This particular distribution may be secondary to problems with the S1 nerve root.  She does have some prior back surgeries the last of which was on June 2020.  She indicates still having some issues with that.  Once she completes her x-rays, I instructed her to give me a call so that we could go over those and determine what is going on and what can be done.  Tentatively I have also enter an order for a left hip intra-articular injection and a left-sided caudal epidural steroid injection under fluoroscopic guidance and IV sedation.  The patient indicated that she was recently told to stop her Xarelto, which which she was taking for a DVT.  I asked her if she was taking any baby aspirin as a preventative measure but she indicated that she is not and therefore I have recommended that she begin taking a baby aspirin.  She was concerned that the persistent pain in her calf was still from a remaining blood clot in the leg, but typically patients are not discontinued from their blood thinners until there is complete resolution of the blood clot.  Therefore, this pain in her calf should not be from the blood clot but instead it may be related to her lower back.  Once she gets done with her x-rays, I will have her come in so that we can complete a physical exam and determine if it is the hip, the lower back, the foot, or all 3 of those.  I went over the plan with the patient who understood and accepted.  Pharmacotherapy Assessment  Analgesic: Oxycodone/APAP  7.5/325, 1 tab PO q 5 times a day. (56.25 mg/day of oxycodone) MME/day:56.25 mg/day.   Monitoring: [place] PMP: PDMP reviewed during this encounter.       Pharmacotherapy: No side-effects or adverse reactions reported. Compliance: No problems identified. Effectiveness: Clinically acceptable. Plan: Refer to  "POC".  UDS:  Summary  Date Value Ref Range Status  06/09/2018 FINAL  Final    Comment:    ==================================================================== TOXASSURE SELECT 13 (MW) ==================================================================== Test                             Result       Flag       Units Drug Present and Declared for Prescription Verification   Oxycodone                      3546         EXPECTED   ng/mg creat   Oxymorphone                    1252         EXPECTED   ng/mg creat   Noroxycodone                   5124         EXPECTED   ng/mg creat   Noroxymorphone                 405          EXPECTED   ng/mg creat    Sources of oxycodone are scheduled prescription medications.    Oxymorphone, noroxycodone, and noroxymorphone are expected    metabolites of oxycodone. Oxymorphone is also available as a    scheduled prescription medication. ==================================================================== Test                      Result    Flag   Units      Ref Range   Creatinine              85               mg/dL      >=20 ==================================================================== Declared Medications:  The flagging and interpretation on this report are based on the  following declared medications.  Unexpected results may arise from  inaccuracies in the declared medications.  **Note: The testing scope of this panel includes these medications:  Oxycodone  Oxycodone (Oxycodone Acetaminophen)  **Note: The testing scope of this panel does not include following  reported medications:  Acetaminophen (Oxycodone Acetaminophen)  Cholecalciferol  Cyanocobalamin  Cyclobenzaprine  Duloxetine  Enalapril  Esomeprazole  Gabapentin  Ibuprofen (Advil)  Melatonin  Rosuvastatin  Topical ==================================================================== For clinical consultation, please call [phone]. ====================================================================     Laboratory Chemistry Profile   Renal Lab Results  Component Value Date   BUN 7 (L) 12/10/2018   CREATININE 1.00 12/10/2018   BCR 9 (L) 12/05/2016   GFRAA >60 12/10/2018   GFRNONAA 59 (L) 12/10/2018     Hepatic Lab Results  Component Value Date   AST 14 (L) 03/08/2018   ALT 14 03/08/2018   ALBUMIN 3.3 (L) 03/08/2018   ALKPHOS 41 03/08/2018     Electrolytes Lab Results  Component Value Date   NA 139 12/10/2018   K 3.8 12/10/2018   CL 104 12/10/2018   CALCIUM 9.0 12/10/2018   MG 2.2 12/05/2016  Bone Lab Results  Component Value Date   25OHVITD1 50 12/05/2016   25OHVITD2 <1.0 12/05/2016   25OHVITD3 49 12/05/2016     Inflammation (CRP: Acute Phase) (ESR: Chronic Phase) Lab Results  Component Value Date   CRP 4.8 12/05/2016   ESRSEDRATE 2 12/05/2016       Note: Above Lab results reviewed.   Imaging  MR KNEE RIGHT WO CONTRAST CLINICAL DATA:  Choosing injury 6 weeks ago. Medial pain.  EXAM: MRI OF THE RIGHT KNEE WITHOUT CONTRAST  TECHNIQUE: Multiplanar, multisequence MR imaging of the knee was performed. No intravenous contrast was administered.  COMPARISON:  None.  FINDINGS: MENISCI  Medial meniscus:  Intact.  Lateral meniscus: Complex tear of the anterior horn-body junction of the lateral meniscus.  LIGAMENTS  Cruciates:  Intact ACL and PCL.  Collaterals: Medial collateral ligament is intact. Lateral collateral ligament complex is intact.  CARTILAGE  Patellofemoral: High-grade partial-thickness cartilage loss of the medial patellar facet with subchondral reactive marrow changes.  Medial: Mild partial-thickness cartilage loss of the medial femorotibial compartment.  Lateral:  No chondral defect.  Joint: Small joint effusion. Minimal edema in Hoffa's fat. No plical thickening.  Popliteal Fossa:  No Baker cyst. Intact popliteus tendon.  Extensor  Mechanism: Intact quadriceps tendon. Mild tendinosis of the patellar tendon origin. Intact medial patellar retinaculum. Intact lateral patellar retinaculum. Intact MPFL.  Bones:  No acute osseous abnormality. No aggressive osseous lesion.  Other: No fluid collection or hematoma. Muscles are normal.  IMPRESSION: 1. Complex tear of the anterior horn-body junction of the lateral meniscus. 2. High-grade partial-thickness cartilage loss of the medial patellar facet with subchondral reactive marrow changes. 3. Mild partial-thickness cartilage loss of the medial femorotibial compartment.  Electronically Signed   By: Kathreen Devoid   On: 12/05/2019 20:39  Assessment  The primary encounter diagnosis was Chronic pain syndrome. Diagnoses of Chronic hip pain (Bilateral) (L>R), Primary osteoarthritis of left hip, Chronic low back pain (Secondary area of Pain) (Bilateral) (midline to tailbone) (R>L), Chronic lumbar radicular pain (Primary Area of Pain) (Left) (S1 Dermatome), Failed back surgical syndrome, Pharmacologic therapy, Myofascial pain, Neurogenic pain, Disorder of skeletal system, and Problems influencing health status were also pertinent to this visit.  Plan of Care  Problem-specific:  No problem-specific Assessment & Plan notes found for this encounter.  Ms. KRYSTL WICKWARE has a current medication list which includes the following long-term medication(s): vitamin b-12, [START ON 12/18/2019] cyclobenzaprine, duloxetine, enalapril, esomeprazole, [START ON 12/18/2019] gabapentin, hydrochlorothiazide, [START ON 12/18/2019] oxycodone-acetaminophen, [START ON 01/17/2020] oxycodone-acetaminophen, [START ON 02/16/2020] oxycodone-acetaminophen, xarelto, and sertraline.  Pharmacotherapy (Medications Ordered): Meds ordered this encounter  Medications  . oxyCODONE-acetaminophen (PERCOCET) 7.5-325 MG tablet    Sig: Take 1 tablet by mouth 5 (five) times daily. Must last 30 days    Dispense:  150 tablet     Refill:  0    Chronic Pain: STOP Act (Not applicable) Fill 1 day early if closed on refill date. Do not fill until: 12/18/2019. To last until: 01/17/2020. Avoid benzodiazepines within 8 hours of opioids  . oxyCODONE-acetaminophen (PERCOCET) 7.5-325 MG tablet    Sig: Take 1 tablet by mouth 5 (five) times daily. Must last 30 days    Dispense:  150 tablet    Refill:  0    Chronic Pain: STOP Act (Not applicable) Fill 1 day early if closed on refill date. Do not fill until: 01/17/2020. To last until: 02/16/2020. Avoid benzodiazepines within 8 hours of opioids  . oxyCODONE-acetaminophen (PERCOCET) 7.5-325  MG tablet    Sig: Take 1 tablet by mouth 5 (five) times daily. Must last 30 days    Dispense:  150 tablet    Refill:  0    Chronic Pain: STOP Act (Not applicable) Fill 1 day early if closed on refill date. Do not fill until: 02/16/2020. To last until: 03/17/2020. Avoid benzodiazepines within 8 hours of opioids  . cyclobenzaprine (FLEXERIL) 10 MG tablet    Sig: Take 1 tablet (10 mg total) by mouth 3 (three) times daily as needed for muscle spasms.    Dispense:  90 tablet    Refill:  5    Fill one day early if pharmacy is closed on scheduled refill date. May substitute for generic if available.  . gabapentin (NEURONTIN) 300 MG capsule    Sig: Take 1 capsule (300 mg total) by mouth 2 (two) times daily.    Dispense:  60 capsule    Refill:  5    Fill one day early if pharmacy is closed on scheduled refill date. May substitute for generic if available.   Orders:  Orders Placed This Encounter  Procedures  . HIP INJECTION    Standing Status:   Future    Standing Expiration Date:   03/16/2020    Scheduling Instructions:     Side: Left-sided     Sedation: With Sedation.     Timeframe: As soon as schedule allows  . Caudal Epidural Injection    Standing Status:   Future    Standing Expiration Date:   01/14/2020    Scheduling Instructions:     Laterality: Left-sided     Level(s): Sacrococcygeal canal  (Tailbone area)     Sedation: With Sedation     Scheduling Timeframe: As soon as pre-approved    Order Specific Question:   Where will this procedure be performed?    Answer:   [facility]  . DG HIP UNILAT W OR W/O PELVIS 2-3 VIEWS LEFT    Standing Status:   Future    Standing Expiration Date:   12/14/2020    Scheduling Instructions:     Please describe any evidence of DJD, such as joint narrowing, asymmetry, cysts, or any anomalies in bone density, production, or erosion.    Order Specific Question:   Reason for Exam (SYMPTOM  OR DIAGNOSIS REQUIRED)    Answer:   Left hip pain/arthralgia    Order Specific Question:   Preferred imaging location?    Answer:   [facility]    Order Specific Question:   Call Results- Best Contact Number?    Answer:   [phone] (Pain Clinic facility) (Dr. Dossie Arbour)  . DG Lumbar Spine Complete W/Bend    Patient presents with axial pain with possible radicular component.  In addition to any acute findings, please report on:  1. Facet (Zygapophyseal) joint DJD (Hypertrophy, space narrowing, subchondral sclerosis, and/or osteophyte formation) 2. DDD and/or IVDD (Loss of disc height, desiccation or "Black disc disease") 3. Pars defects 4. Spondylolisthesis, spondylosis, and/or spondyloarthropathies (include Degree/Grade of displacement in mm) 5. Vertebral body Fractures, including age (old, new/acute) 52. Modic Type Changes 7. Demineralization 8. Bone pathology 9. Central, Lateral Recess, and/or Foraminal Stenosis (include AP diameter of stenosis in mm) 10. Surgical changes (hardware type, status, and presence of fibrosis)  NOTE: Please specify level(s) and laterality. If applicable: Please indicate ROM and/or evidence of instability (>77m displacement between flexion and extension views)    Standing Status:   Future  Standing Expiration Date:   03/16/2020    Order Specific Question:   Reason for Exam (SYMPTOM  OR DIAGNOSIS REQUIRED)     Answer:   Low back pain    Order Specific Question:   Preferred imaging location?    Answer:   [facility]    Order Specific Question:   Call Results- Best Contact Number?    Answer:   [phone] ([facility])    Order Specific Question:   Radiology Contrast Protocol - do NOT remove file path    Answer:   [email or web address]  . ToxASSURE Select 13 (MW), Urine    Volume: 30 ml(s). Minimum 3 ml of urine is needed. Document temperature of fresh sample. Indications: Long term (current) use of opiate analgesic (K93.818)    Order Specific Question:   Release to patient    Answer:   Immediate  . Comp. Metabolic Panel (12)    With GFR. Indications: Chronic Pain Syndrome (G89.4) & Pharmacotherapy (E99.371)    Order Specific Question:   Has the patient fasted?    Answer:   No    Order Specific Question:   CC Results    Answer:   IRC-VELFY [[id number]]    Order Specific Question:   Release to patient    Answer:   Immediate  . Magnesium    Indication: Pharmacologic therapy (Z79.899)    Order Specific Question:   CC Results    Answer:   PCP-NURSE [[id number]]    Order Specific Question:   Release to patient    Answer:   Immediate  . Vitamin B12    Indication: Pharmacologic therapy (D78.242).    Order Specific Question:   CC Results    Answer:   PNT-IRWER [[id number]]    Order Specific Question:   Release to patient    Answer:   Immediate  . Sedimentation rate    Indication: Disorder of skeletal system (M89.9)    Order Specific Question:   CC Results    Answer:   PCP-NURSE [[id number]]    Order Specific Question:   Release to patient    Answer:   Immediate  . 25-Hydroxy vitamin D Lcms D2+D3    Indication: Disorder of skeletal system (M89.9).    Order Specific Question:   CC Results    Answer:   KDT-OIZTI [[id number]]    Order Specific Question:   Release to patient    Answer:   Immediate  . C-reactive protein    Indication: Problems influencing  health status (Z78.9)    Order Specific Question:   CC Results    Answer:   PCP-NURSE [[id number]]    Order Specific Question:   Release to patient    Answer:   Immediate   Follow-up plan:   Return in about 13 weeks (around 03/15/2020) for (F2F), (MM), in addition, Procedure (w/ sedation): (L) IA Hip inj. vs. (L) Caudal ESI.      Interventional management options:  Considering:   NOTE: XARELTO ANTICOAGULATION. (Stop: 3 days  Restart: 6 hrs) Diagnostic caudal epidural steroid injection + diagnostic epidurogram Diagnostic CESI Diagnostic bilateral cervical facet block Possible bilateral cervical facet RFA  Diagnostic bilateral lumbar facet block Possible bilateral lumbar facet RFA  Diagnostic left L4-5 LESI  Diagnostic left S1 SNRB Diagnostic bilateral SI joint block Possible bilateral SI joint RFA  Diagnostic right trochanteric bursa injection   Palliative PRN treatment(s):   Palliativebilateral lumbar facet block #3+bilateral sacroiliac joint block#6 PalliativeCESI  Palliativebilateral cervical facet block Palliativeleft L4-5 LESI  Palliativeleft caudal ESI  Palliativeleft S1 SNRB Palliativebilateral SI joint block Palliativeright trochanteric bursa injection       Recent Visits No visits were found meeting these conditions. Showing recent visits within past 90 days and meeting all other requirements Today's Visits Date Type Provider Dept  12/15/19 Telemedicine Milinda Pointer, MD [facility]  Showing today's visits and meeting all other requirements Future Appointments No visits were found meeting these conditions. Showing future appointments within next 90 days and meeting all other requirements  I discussed the assessment and treatment plan with the patient. The patient was provided an opportunity to ask questions and all were answered. The patient agreed with the plan and demonstrated an understanding of the instructions.  Patient  advised to call back or seek an in-person evaluation if the symptoms or condition worsens.  Duration of encounter: 25 minutes.  Note by: Gaspar Cola, MD Date: 12/15/2019; Time: 1:11 PM

## 2019-12-14 NOTE — Telephone Encounter (Signed)
Attempted to call patient.  Call could not be completed at this time.

## 2019-12-15 ENCOUNTER — Telehealth: Payer: Self-pay

## 2019-12-15 ENCOUNTER — Ambulatory Visit: Payer: Medicare Other | Attending: Pain Medicine | Admitting: Pain Medicine

## 2019-12-15 ENCOUNTER — Other Ambulatory Visit: Payer: Self-pay

## 2019-12-15 DIAGNOSIS — G894 Chronic pain syndrome: Secondary | ICD-10-CM | POA: Diagnosis not present

## 2019-12-15 DIAGNOSIS — Z789 Other specified health status: Secondary | ICD-10-CM

## 2019-12-15 DIAGNOSIS — M545 Low back pain: Secondary | ICD-10-CM

## 2019-12-15 DIAGNOSIS — M25551 Pain in right hip: Secondary | ICD-10-CM | POA: Diagnosis not present

## 2019-12-15 DIAGNOSIS — M5416 Radiculopathy, lumbar region: Secondary | ICD-10-CM

## 2019-12-15 DIAGNOSIS — M1612 Unilateral primary osteoarthritis, left hip: Secondary | ICD-10-CM

## 2019-12-15 DIAGNOSIS — M7918 Myalgia, other site: Secondary | ICD-10-CM

## 2019-12-15 DIAGNOSIS — G8929 Other chronic pain: Secondary | ICD-10-CM

## 2019-12-15 DIAGNOSIS — M899 Disorder of bone, unspecified: Secondary | ICD-10-CM

## 2019-12-15 DIAGNOSIS — Z79899 Other long term (current) drug therapy: Secondary | ICD-10-CM

## 2019-12-15 DIAGNOSIS — M25552 Pain in left hip: Secondary | ICD-10-CM

## 2019-12-15 DIAGNOSIS — M961 Postlaminectomy syndrome, not elsewhere classified: Secondary | ICD-10-CM

## 2019-12-15 DIAGNOSIS — M792 Neuralgia and neuritis, unspecified: Secondary | ICD-10-CM

## 2019-12-15 MED ORDER — OXYCODONE-ACETAMINOPHEN 7.5-325 MG PO TABS: 1.0000 | ORAL_TABLET | Freq: Every day | ORAL | 0 refills | Status: DC

## 2019-12-15 MED ORDER — CYCLOBENZAPRINE HCL 10 MG PO TABS: 10.0000 mg | ORAL_TABLET | Freq: Three times a day (TID) | ORAL | 5 refills | Status: DC | PRN

## 2019-12-15 MED ORDER — GABAPENTIN 300 MG PO CAPS: 300.0000 mg | ORAL_CAPSULE | Freq: Two times a day (BID) | ORAL | 5 refills | Status: DC

## 2019-12-15 NOTE — Patient Instructions (Signed)
____________________________________________________________________________________________  Preparing for Procedure with Sedation  Procedure appointments are limited to planned procedures: . No Prescription Refills. . No disability issues will be discussed. . No medication changes will be discussed.  Instructions: . Oral Intake: Do not eat or drink anything for at least 8 hours prior to your procedure. (Exception: Blood Pressure Medication. See below.) . Transportation: Unless otherwise stated by your physician, you may drive yourself after the procedure. . Blood Pressure Medicine: Do not forget to take your blood pressure medicine with a sip of water the morning of the procedure. If your Diastolic (lower reading)is above 100 mmHg, elective cases will be cancelled/rescheduled. . Blood thinners: These will need to be stopped for procedures. Notify our staff if you are taking any blood thinners. Depending on which one you take, there will be specific instructions on how and when to stop it. . Diabetics on insulin: Notify the staff so that you can be scheduled 1st case in the morning. If your diabetes requires high dose insulin, take only  of your normal insulin dose the morning of the procedure and notify the staff that you have done so. . Preventing infections: Shower with an antibacterial soap the morning of your procedure. . Build-up your immune system: Take 1000 mg of Vitamin C with every meal (3 times a day) the day prior to your procedure. . Antibiotics: Inform the staff if you have a condition or reason that requires you to take antibiotics before dental procedures. . Pregnancy: If you are pregnant, call and cancel the procedure. . Sickness: If you have a cold, fever, or any active infections, call and cancel the procedure. . Arrival: You must be in the facility at least 30 minutes prior to your scheduled procedure. . Children: Do not bring children with you. . Dress appropriately:  Bring dark clothing that you would not mind if they get stained. . Valuables: Do not bring any jewelry or valuables.  Reasons to call and reschedule or cancel your procedure: (Following these recommendations will minimize the risk of a serious complication.) . Surgeries: Avoid having procedures within 2 weeks of any surgery. (Avoid for 2 weeks before or after any surgery). . Flu Shots: Avoid having procedures within 2 weeks of a flu shots or . (Avoid for 2 weeks before or after immunizations). . Barium: Avoid having a procedure within 7-10 days after having had a radiological study involving the use of radiological contrast. (Myelograms, Barium swallow or enema study). . Heart attacks: Avoid any elective procedures or surgeries for the initial 6 months after a "Myocardial Infarction" (Heart Attack). . Blood thinners: It is imperative that you stop these medications before procedures. Let us know if you if you take any blood thinner.  . Infection: Avoid procedures during or within two weeks of an infection (including chest colds or gastrointestinal problems). Symptoms associated with infections include: Localized redness, fever, chills, night sweats or profuse sweating, burning sensation when voiding, cough, congestion, stuffiness, runny nose, sore throat, diarrhea, nausea, vomiting, cold or Flu symptoms, recent or current infections. It is specially important if the infection is over the area that we intend to treat. . Heart and lung problems: Symptoms that may suggest an active cardiopulmonary problem include: cough, chest pain, breathing difficulties or shortness of breath, dizziness, ankle swelling, uncontrolled high or unusually low blood pressure, and/or palpitations. If you are experiencing any of these symptoms, cancel your procedure and contact your primary care physician for an evaluation.  Remember:  Regular Business hours are:    Monday to Thursday 8:00 AM to 4:00 PM  Provider's  Schedule: Orbin Mayeux, MD:  Procedure days: Tuesday and Thursday 7:30 AM to 4:00 PM  Bilal Lateef, MD:  Procedure days: Monday and Wednesday 7:30 AM to 4:00 PM ____________________________________________________________________________________________    

## 2019-12-16 ENCOUNTER — Ambulatory Visit
Admission: RE | Admit: 2019-12-16 | Discharge: 2019-12-16 | Disposition: A | Discharge status: Home / Self Care | Payer: Medicare Other | Attending: Pain Medicine | Admitting: Pain Medicine

## 2019-12-16 ENCOUNTER — Ambulatory Visit
Admission: RE | Admit: 2019-12-16 | Discharge: 2019-12-16 | Disposition: A | Discharge status: Home / Self Care | Payer: Medicare Other | Source: Ambulatory Visit | Attending: Pain Medicine | Admitting: Pain Medicine

## 2019-12-16 DIAGNOSIS — G8929 Other chronic pain: Secondary | ICD-10-CM | POA: Diagnosis present

## 2019-12-16 DIAGNOSIS — M5416 Radiculopathy, lumbar region: Secondary | ICD-10-CM | POA: Insufficient documentation

## 2019-12-16 DIAGNOSIS — M1612 Unilateral primary osteoarthritis, left hip: Secondary | ICD-10-CM

## 2019-12-16 DIAGNOSIS — M545 Low back pain, unspecified: Secondary | ICD-10-CM

## 2019-12-16 DIAGNOSIS — M25552 Pain in left hip: Secondary | ICD-10-CM | POA: Diagnosis present

## 2019-12-16 DIAGNOSIS — M25551 Pain in right hip: Secondary | ICD-10-CM | POA: Diagnosis present

## 2019-12-17 ENCOUNTER — Other Ambulatory Visit: Payer: Self-pay | Admitting: *Deleted

## 2019-12-17 DIAGNOSIS — M7989 Other specified soft tissue disorders: Secondary | ICD-10-CM

## 2019-12-21 ENCOUNTER — Ambulatory Visit
Admission: RE | Admit: 2019-12-21 | Discharge: 2019-12-21 | Disposition: A | Discharge status: Home / Self Care | Payer: Medicare Other | Source: Ambulatory Visit | Attending: Pain Medicine | Admitting: Pain Medicine

## 2019-12-21 ENCOUNTER — Encounter: Payer: Self-pay | Admitting: Pain Medicine

## 2019-12-21 ENCOUNTER — Ambulatory Visit (HOSPITAL_BASED_OUTPATIENT_CLINIC_OR_DEPARTMENT_OTHER): Payer: Medicare Other | Admitting: Pain Medicine

## 2019-12-21 ENCOUNTER — Other Ambulatory Visit: Payer: Self-pay

## 2019-12-21 VITALS — BP 98/62 | HR 86 | Temp 97.5°F | Resp 16 | Ht 61.5 in | Wt 150.0 lb

## 2019-12-21 DIAGNOSIS — R7989 Other specified abnormal findings of blood chemistry: Secondary | ICD-10-CM

## 2019-12-21 DIAGNOSIS — M51369 Other intervertebral disc degeneration, lumbar region without mention of lumbar back pain or lower extremity pain: Secondary | ICD-10-CM

## 2019-12-21 DIAGNOSIS — M545 Low back pain, unspecified: Secondary | ICD-10-CM

## 2019-12-21 DIAGNOSIS — Z7901 Long term (current) use of anticoagulants: Secondary | ICD-10-CM | POA: Diagnosis present

## 2019-12-21 DIAGNOSIS — M961 Postlaminectomy syndrome, not elsewhere classified: Secondary | ICD-10-CM

## 2019-12-21 DIAGNOSIS — M25552 Pain in left hip: Secondary | ICD-10-CM | POA: Insufficient documentation

## 2019-12-21 DIAGNOSIS — M5136 Other intervertebral disc degeneration, lumbar region: Secondary | ICD-10-CM | POA: Insufficient documentation

## 2019-12-21 DIAGNOSIS — M25551 Pain in right hip: Secondary | ICD-10-CM

## 2019-12-21 DIAGNOSIS — R748 Abnormal levels of other serum enzymes: Secondary | ICD-10-CM | POA: Insufficient documentation

## 2019-12-21 DIAGNOSIS — G8929 Other chronic pain: Secondary | ICD-10-CM

## 2019-12-21 DIAGNOSIS — R944 Abnormal results of kidney function studies: Secondary | ICD-10-CM | POA: Diagnosis present

## 2019-12-21 DIAGNOSIS — M1612 Unilateral primary osteoarthritis, left hip: Secondary | ICD-10-CM | POA: Insufficient documentation

## 2019-12-21 LAB — TOXASSURE SELECT 13 (MW), URINE

## 2019-12-21 MED ORDER — TRIAMCINOLONE ACETONIDE 40 MG/ML IJ SUSP
40.0000 mg | Freq: Once | INTRAMUSCULAR | Status: AC
  Administered 2019-12-21: 40 mg
  Filled 2019-12-21: qty 1

## 2019-12-21 MED ORDER — METHYLPREDNISOLONE ACETATE 80 MG/ML IJ SUSP
80.0000 mg | Freq: Once | INTRAMUSCULAR | Status: AC
  Administered 2019-12-21: 80 mg via INTRA_ARTICULAR
  Filled 2019-12-21: qty 1

## 2019-12-21 MED ORDER — LACTATED RINGERS IV SOLN
1000.0000 mL | Freq: Once | INTRAVENOUS | Status: AC
  Administered 2019-12-21: 1000 mL via INTRAVENOUS

## 2019-12-21 MED ORDER — MIDAZOLAM HCL 5 MG/5ML IJ SOLN
1.0000 mg | INTRAMUSCULAR | Status: DC | PRN
  Administered 2019-12-21: 2.5 mg via INTRAVENOUS
  Filled 2019-12-21: qty 5

## 2019-12-21 MED ORDER — FENTANYL CITRATE (PF) 100 MCG/2ML IJ SOLN
25.0000 ug | INTRAMUSCULAR | Status: DC | PRN
  Administered 2019-12-21: 50 ug via INTRAVENOUS
  Filled 2019-12-21: qty 2

## 2019-12-21 MED ORDER — IOHEXOL 180 MG/ML  SOLN
10.0000 mL | Freq: Once | INTRAMUSCULAR | Status: AC
  Administered 2019-12-21: 20 mL via EPIDURAL

## 2019-12-21 MED ORDER — ROPIVACAINE HCL 2 MG/ML IJ SOLN
9.0000 mL | Freq: Once | INTRAMUSCULAR | Status: AC
  Administered 2019-12-21: 9 mL via INTRA_ARTICULAR
  Filled 2019-12-21: qty 10

## 2019-12-21 MED ORDER — LIDOCAINE HCL 2 % IJ SOLN
20.0000 mL | Freq: Once | INTRAMUSCULAR | Status: AC
  Administered 2019-12-21: 200 mg

## 2019-12-21 MED ORDER — ROPIVACAINE HCL 2 MG/ML IJ SOLN
2.0000 mL | Freq: Once | INTRAMUSCULAR | Status: AC
  Administered 2019-12-21: 2 mL via EPIDURAL
  Filled 2019-12-21: qty 10

## 2019-12-21 MED ORDER — SODIUM CHLORIDE 0.9% FLUSH
2.0000 mL | Freq: Once | INTRAVENOUS | Status: AC
  Administered 2019-12-21: 2 mL

## 2019-12-21 NOTE — Patient Instructions (Signed)

## 2019-12-21 NOTE — Progress Notes (Signed)
PROVIDER NOTE: Information contained herein reflects review and annotations entered in association with encounter. Interpretation of such information and data should be left to medically-trained personnel. Information provided to patient can be located elsewhere in the medical record under "Patient Instructions". Document created using STT-dictation technology, any transcriptional errors that may result from process are unintentional.    Patient: Jacqueline Thornton  Service Category: Procedure  Provider: Gaspar Cola, MD  DOB: June 07, 1951  DOS: 12/21/2019  Location: [facility]  MRN: [id number]  Setting: Ambulatory - outpatient  Referring Provider: Perrin Maltese, MD  Type: Established Patient  Specialty: Interventional Pain Management  PCP: Perrin Maltese, MD   Primary Reason for Visit: Interventional Pain Management Treatment. CC: Back Pain (lower)  Procedure #1:  Anesthesia, Analgesia, Anxiolysis:  Type: Intra-Articular Hip Injection #1  Primary Purpose: Diagnostic Region: Posterolateral hip joint area. Level: Lower pelvic and hip joint level. Target Area: Superior aspect of the hip joint cavity, going thru the superior portion of the capsular ligament. Approach: Posterolateral approach. Laterality: Left  Type: Moderate (Conscious) Sedation combined with Local Anesthesia Indication(s): Analgesia and Anxiety Route: Intravenous (IV) IV Access: Secured Sedation: Meaningful verbal contact was maintained at all times during the procedure  Local Anesthetic: Lidocaine 1-2%  Position: Lateral Decubitus with bad side up Prepped Area: Entire Posterolateral hip area. DuraPrep (Iodine Povacrylex [0.7% available iodine] and Isopropyl Alcohol, 74% w/w)   Indications: 1. Chronic hip pain (Bilateral) (L>R)   2. Primary osteoarthritis of left hip   3. Chronic anticoagulation (Xarelto)    Pain Score: Pre-procedure: 5 /10 Post-procedure: 0-No pain/10   Pre-op Assessment:  Ms.  Rummell is a 69 y.o. (year old), female patient, seen today for interventional treatment. She  has a past surgical history that includes Cholecystectomy; Abdominal hysterectomy; Neck surgery; Shoulder surgery; Colonoscopy with propofol (N/A, 07/19/2015); caract surger; Laparoscopic salpingo oophorectomy (Bilateral, 03/03/2018); Cystoscopy (03/03/2018); Tonsillectomy; Appendectomy; Tumor removal; Anterior lumbar fusion (N/A, 12/11/2018); and Abdominal exposure (N/A, 12/11/2018). Ms. Folker has a current medication list which includes the following prescription(s): vitamin d3, vitamin b-12, cyclobenzaprine, duloxetine, enalapril, esomeprazole, estradiol, gabapentin, hydrochlorothiazide, melatonin, oxycodone-acetaminophen, [START ON 01/17/2020] oxycodone-acetaminophen, [START ON 02/16/2020] oxycodone-acetaminophen, xarelto, rosuvastatin, and sertraline, and the following Facility-Administered Medications: fentanyl and midazolam. Her primarily concern today is the Back Pain (lower)  Initial Vital Signs:  Pulse/HCG Rate: 86ECG Heart Rate: 76 Temp: (!) 97.2 F (36.2 C) Resp: 17 BP: 99/83 SpO2: 99 %  BMI: Estimated body mass index is 27.88 kg/m as calculated from the following:   Height as of this encounter: 5' 1.5" (1.562 m).   Weight as of this encounter: 150 lb (68 kg).  Risk Assessment: Allergies: Reviewed. She has No Known Allergies.  Allergy Precautions: None required Coagulopathies: Reviewed. None identified.  Blood-thinner therapy: None at this time Active Infection(s): Reviewed. None identified. Ms. Erlich is afebrile  Site Confirmation: Ms. Menn was asked to confirm the procedure and laterality before marking the site Procedure checklist: Completed Consent: Before the procedure and under the influence of no sedative(s), amnesic(s), or anxiolytics, the patient was informed of the treatment options, risks and possible complications. To fulfill our ethical and legal obligations, as recommended by  the American Medical Association's Code of Ethics, I have informed the patient of my clinical impression; the nature and purpose of the treatment or procedure; the risks, benefits, and possible complications of the intervention; the alternatives, including doing nothing; the risk(s) and benefit(s) of the alternative treatment(s) or procedure(s); and the risk(s) and  benefit(s) of doing nothing. The patient was provided information about the general risks and possible complications associated with the procedure. These may include, but are not limited to: failure to achieve desired goals, infection, bleeding, organ or nerve damage, allergic reactions, paralysis, and death. In addition, the patient was informed of those risks and complications associated to the procedure, such as failure to decrease pain; infection; bleeding; organ or nerve damage with subsequent damage to sensory, motor, and/or autonomic systems, resulting in permanent pain, numbness, and/or weakness of one or several areas of the body; allergic reactions; (i.e.: anaphylactic reaction); and/or death. Furthermore, the patient was informed of those risks and complications associated with the medications. These include, but are not limited to: allergic reactions (i.e.: anaphylactic or anaphylactoid reaction(s)); adrenal axis suppression; blood sugar elevation that in diabetics may result in ketoacidosis or comma; water retention that in patients with history of congestive heart failure may result in shortness of breath, pulmonary edema, and decompensation with resultant heart failure; weight gain; swelling or edema; medication-induced neural toxicity; particulate matter embolism and blood vessel occlusion with resultant organ, and/or nervous system infarction; and/or aseptic necrosis of one or more joints. Finally, the patient was informed that Medicine is not an exact science; therefore, there is also the possibility of unforeseen or unpredictable  risks and/or possible complications that may result in a catastrophic outcome. The patient indicated having understood very clearly. We have given the patient no guarantees and we have made no promises. Enough time was given to the patient to ask questions, all of which were answered to the patient's satisfaction. Ms. Arvin has indicated that she wanted to continue with the procedure. Attestation: I, the ordering provider, attest that I have discussed with the patient the benefits, risks, side-effects, alternatives, likelihood of achieving goals, and potential problems during recovery for the procedure that I have provided informed consent. Date  Time: 12/21/2019  9:42 AM  Pre-Procedure Preparation:  Monitoring: As per clinic protocol. Respiration, ETCO2, SpO2, BP, heart rate and rhythm monitor placed and checked for adequate function Safety Precautions: Patient was assessed for positional comfort and pressure points before starting the procedure. Time-out: I initiated and conducted the "Time-out" before starting the procedure, as per protocol. The patient was asked to participate by confirming the accuracy of the "Time Out" information. Verification of the correct person, site, and procedure were performed and confirmed by me, the nursing staff, and the patient. "Time-out" conducted as per Joint Commission's Universal Protocol (UP.01.01.01). Time: 1113  Description of Procedure #1:  Safety Precautions: Aspiration looking for blood return was conducted prior to all injections. At no point did we inject any substances, as a needle was being advanced. No attempts were made at seeking any paresthesias. Safe injection practices and needle disposal techniques used. Medications properly checked for expiration dates. SDV (single dose vial) medications used. Description of the Procedure: Protocol guidelines were followed. The patient was placed in position over the fluoroscopy table. The target area was  identified and the area prepped in the usual manner. Skin & deeper tissues infiltrated with local anesthetic. Appropriate amount of time allowed to pass for local anesthetics to take effect. The procedure needles were then advanced to the target area. Proper needle placement secured. Negative aspiration confirmed. Solution injected in intermittent fashion, asking for systemic symptoms every 0.5cc of injectate. The needles were then removed and the area cleansed, making sure to leave some of the prepping solution back to take advantage of its long term bactericidal properties. Vitals:  12/21/19 1123 12/21/19 1133 12/21/19 1143 12/21/19 1153  BP: 111/63 113/65 128/77 98/62  Pulse:      Resp: 16 17 16 16   Temp:  (!) 97.5 F (36.4 C)  (!) 97.5 F (36.4 C)  TempSrc:  Temporal    SpO2: 100% 100% 100% 100%  Weight:      Height:        Start Time: 1113 hrs. End Time: 1118 hrs. Materials:  Needle(s) Type: Spinal Needle Gauge: 22G Length: 5.0-in Medication(s): Please see orders for medications and dosing details.  Imaging Guidance (Non-Spinal) for procedure #1:  Type of Imaging Technique: Fluoroscopy Guidance (Non-Spinal) Indication(s): Assistance in needle guidance and placement for procedures requiring needle placement in or near specific anatomical locations not easily accessible without such assistance. Exposure Time: Please see nurses notes. Contrast: Before injecting any contrast, we confirmed that the patient did not have an allergy to iodine, shellfish, or radiological contrast. Once satisfactory needle placement was completed at the desired level, radiological contrast was injected. Contrast injected under live fluoroscopy. No contrast complications. See chart for type and volume of contrast used. Fluoroscopic Guidance: I was personally present during the use of fluoroscopy. "Tunnel Vision Technique" used to obtain the best possible view of the target area. Parallax error corrected before  commencing the procedure. "Direction-depth-direction" technique used to introduce the needle under continuous pulsed fluoroscopy. Once target was reached, antero-posterior, oblique, and lateral fluoroscopic projection used confirm needle placement in all planes. Images permanently stored in EMR. Interpretation: I personally interpreted the imaging intraoperatively. Adequate needle placement confirmed in multiple planes. Appropriate spread of contrast into desired area was observed. No evidence of afferent or efferent intravascular uptake. Permanent images saved into the patient's record.  Procedure #2:  Anesthesia, Analgesia, Anxiolysis:  Type: Diagnostic Epidural Steroid Injection          Region: Caudal Level: Sacrococcygeal   Laterality: Midline aiming at the left  Type: Moderate (Conscious) Sedation combined with Local Anesthesia Indication(s): Analgesia and Anxiety Route: Intravenous (IV) IV Access: Secured Sedation: Meaningful verbal contact was maintained at all times during the procedure  Local Anesthetic: Lidocaine 1-2%  Position: Prone   Indications: 1. DDD (degenerative disc disease), lumbar   2. Failed back surgical syndrome   3. Chronic low back pain (Secondary area of Pain) (Bilateral) (midline to tailbone) (R>L)   4. Chronic anticoagulation (Xarelto)     Description of Procedure #2:  Target Area: Caudal Epidural Canal. Approach: Midline approach. Area Prepped: Entire Posterior Sacrococcygeal Region DuraPrep (Iodine Povacrylex [0.7% available iodine] and Isopropyl Alcohol, 74% w/w) Safety Precautions: Aspiration looking for blood return was conducted prior to all injections. At no point did we inject any substances, as a needle was being advanced. No attempts were made at seeking any paresthesias. Safe injection practices and needle disposal techniques used. Medications properly checked for expiration dates. SDV (single dose vial) medications used. Description of the  Procedure: Protocol guidelines were followed. The patient was placed in position over the fluoroscopy table. The target area was identified and the area prepped in the usual manner. Skin & deeper tissues infiltrated with local anesthetic. Appropriate amount of time allowed to pass for local anesthetics to take effect. The procedure needles were then advanced to the target area. Proper needle placement secured. Negative aspiration confirmed. Solution injected in intermittent fashion, asking for systemic symptoms every 0.5cc of injectate. The needles were then removed and the area cleansed, making sure to leave some of the prepping solution back to take advantage  of its long term bactericidal properties. Vitals:   12/21/19 1123 12/21/19 1133 12/21/19 1143 12/21/19 1153  BP: 111/63 113/65 128/77 98/62  Pulse:      Resp: 16 17 16 16   Temp:  (!) 97.5 F (36.4 C)  (!) 97.5 F (36.4 C)  TempSrc:  Temporal    SpO2: 100% 100% 100% 100%  Weight:      Height:        Start Time: 1113 hrs. End Time: 1118 hrs. Materials:  Needle(s) Type: Epidural needle Gauge: 17G Length: 3.5-in Medication(s): Please see orders for medications and dosing details.  Imaging Guidance (Spinal) for procedure #2:  Type of Imaging Technique: Fluoroscopy Guidance (Spinal) Indication(s): Assistance in needle guidance and placement for procedures requiring needle placement in or near specific anatomical locations not easily accessible without such assistance. Exposure Time: Please see nurses notes. Contrast: Before injecting any contrast, we confirmed that the patient did not have an allergy to iodine, shellfish, or radiological contrast. Once satisfactory needle placement was completed at the desired level, radiological contrast was injected. Contrast injected under live fluoroscopy. No contrast complications. See chart for type and volume of contrast used. Fluoroscopic Guidance: I was personally present during the use of  fluoroscopy. "Tunnel Vision Technique" used to obtain the best possible view of the target area. Parallax error corrected before commencing the procedure. "Direction-depth-direction" technique used to introduce the needle under continuous pulsed fluoroscopy. Once target was reached, antero-posterior, oblique, and lateral fluoroscopic projection used confirm needle placement in all planes. Images permanently stored in EMR. Interpretation: I personally interpreted the imaging intraoperatively. Adequate needle placement confirmed in multiple planes. Appropriate spread of contrast into desired area was observed. No evidence of afferent or efferent intravascular uptake. No intrathecal or subarachnoid spread observed. Permanent images saved into the patient's record.  Antibiotic Prophylaxis:   Anti-infectives (From admission, onward)   None     Indication(s): None identified  Post-operative Assessment:  Post-procedure Vital Signs:  Pulse/HCG Rate: 8671 Temp: (!) 97.5 F (36.4 C) Resp: 16 BP: 98/62 SpO2: 100 %  EBL: None  Complications: No immediate post-treatment complications observed by team, or reported by patient.  Note: The patient tolerated the entire procedure well. A repeat set of vitals were taken after the procedure and the patient was kept under observation following institutional policy, for this type of procedure. Post-procedural neurological assessment was performed, showing return to baseline, prior to discharge. The patient was provided with post-procedure discharge instructions, including a section on how to identify potential problems. Should any problems arise concerning this procedure, the patient was given instructions to immediately contact us, at any time, without hesitation. In any case, we plan to contact the patient by telephone for a follow-up status report regarding this interventional procedure.  Comments:  No additional relevant information.  Plan of Care  Orders:   Orders Placed This Encounter  Procedures  . Caudal Epidural Injection    Scheduling Instructions:     Laterality: Midline     Level(s): Sacrococcygeal canal (Tailbone area)     Sedation: Patient's choice     Timeframe: Today    Order Specific Question:   Where will this procedure be performed?    Answer:   [facility]  . HIP INJECTION    Scheduling Instructions:     Side: Left-sided     Sedation: Patient's choice.     Timeframe: Today  . DG PAIN CLINIC C-ARM 1-60 MIN NO REPORT    Intraoperative interpretation by procedural  physician at [facility].    Standing Status:   Standing    Number of Occurrences:   1    Order Specific Question:   Reason for exam:    Answer:   Assistance in needle guidance and placement for procedures requiring needle placement in or near specific anatomical locations not easily accessible without such assistance.  . Ambulatory referral to Nephrology    Referral Priority:   Routine    Referral Type:   Consultation    Referral Reason:   Specialty Services Required    Requested Specialty:   Nephrology    Number of Visits Requested:   1  . Informed Consent Details: Physician/Practitioner Attestation; Transcribe to consent form and obtain patient signature    Nursing Order: Transcribe to consent form and obtain patient signature. Note: Always confirm laterality of pain with Ms. Guillet, before procedure. Procedure: Caudal epidural steroid injection Indication/Reason: Low back pain and lower extremity pain secondary to lumbosacral radiculitis Provider Attestation: I, [place] Dossie Arbour, MD, (Pain Management Specialist), the physician/practitioner, attest that I have discussed with the patient the benefits, risks, side effects, alternatives, likelihood of achieving goals and potential problems during recovery for the procedure that I have provided informed consent.  . Care order/instruction: Please confirm that the patient has stopped the  Xarelto (Rivaroxaban) x 3 days prior to procedure or surgery.    Please confirm that the patient has stopped the Xarelto (Rivaroxaban) x 3 days prior to procedure or surgery.    Standing Status:   Standing    Number of Occurrences:   1  . Informed Consent Details: Physician/Practitioner Attestation; Transcribe to consent form and obtain patient signature    Nursing Order: Transcribe to consent form and obtain patient signature. Note: Always confirm laterality of pain with Ms. Potempa, before procedure. Procedure: Hip injection Indication/Reason: Hip Joint Pain (Arthralgia) Provider Attestation: I, [place] Dossie Arbour, MD, (Pain Management Specialist), the physician/practitioner, attest that I have discussed with the patient the benefits, risks, side effects, alternatives, likelihood of achieving goals and potential problems during recovery for the procedure that I have provided informed consent.  . Provide equipment / supplies at bedside    Equipment required: Single use, disposable, "Block Tray"    Standing Status:   Standing    Number of Occurrences:   1    Order Specific Question:   Specify    Answer:   Block Tray  . Bleeding precautions    Standing Status:   Standing    Number of Occurrences:   1   Chronic Opioid Analgesic:  Oxycodone/APAP 7.5/325, 1 tab PO q 5 times a day. (56.25 mg/day of oxycodone) MME/day:56.25 mg/day.   Medications ordered for procedure: Meds ordered this encounter  Medications  . iohexol (OMNIPAQUE) 180 MG/ML injection 10 mL    Must be Myelogram-compatible. If not available, you may substitute with a water-soluble, non-ionic, hypoallergenic, myelogram-compatible radiological contrast medium.  Marland Kitchen lidocaine (XYLOCAINE) 2 % (with pres) injection 400 mg  . lactated ringers infusion 1,000 mL  . midazolam (VERSED) 5 MG/5ML injection 1-2 mg    Make sure Flumazenil is available in the pyxis when using this medication. If oversedation occurs, administer 0.2 mg IV  over 15 sec. If after 45 sec no response, administer 0.2 mg again over 1 min; may repeat at 1 min intervals; not to exceed 4 doses (1 mg)  . fentaNYL (SUBLIMAZE) injection 25-50 mcg    Make sure Narcan is available in the pyxis when using  this medication. In the event of respiratory depression (RR< 8/min): Titrate NARCAN (naloxone) in increments of 0.1 to 0.2 mg IV at 2-3 minute intervals, until desired degree of reversal.  . sodium chloride flush (NS) 0.9 % injection 2 mL  . ropivacaine (PF) 2 mg/mL (0.2%) (NAROPIN) injection 2 mL  . triamcinolone acetonide (KENALOG-40) injection 40 mg  . ropivacaine (PF) 2 mg/mL (0.2%) (NAROPIN) injection 9 mL  . methylPREDNISolone acetate (DEPO-MEDROL) injection 80 mg   Medications administered: We administered iohexol, lidocaine, lactated ringers, midazolam, fentaNYL, sodium chloride flush, ropivacaine (PF) 2 mg/mL (0.2%), triamcinolone acetonide, ropivacaine (PF) 2 mg/mL (0.2%), and methylPREDNISolone acetate.  See the medical record for exact dosing, route, and time of administration.  Follow-up plan:   Return in about 2 weeks (around 01/04/2020) for (F2F), (PP).       Interventional management options:  Considering:   NOTE: XARELTO ANTICOAGULATION. (Stop: 3 days  Restart: 6 hrs) Diagnostic caudal epidural steroid injection + diagnostic epidurogram Diagnostic CESI Diagnostic bilateral cervical facet block Possible bilateral cervical facet RFA  Diagnostic bilateral lumbar facet block Possible bilateral lumbar facet RFA  Diagnostic left L4-5 LESI  Diagnostic left S1 SNRB Diagnostic bilateral SI joint block Possible bilateral SI joint RFA  Diagnostic right trochanteric bursa injection   Palliative PRN treatment(s):   Palliativebilateral lumbar facet block #3+bilateral sacroiliac joint block#6 PalliativeCESI  Palliativebilateral cervical facet block Palliativeleft L4-5 LESI  Palliativeleft caudal ESI  Palliativeleft S1  SNRB Palliativebilateral SI joint block Palliativeright trochanteric bursa injection    Recent Visits Date Type Provider Dept  12/15/19 Telemedicine Milinda Pointer, MD [facility]  Showing recent visits within past 90 days and meeting all other requirements Today's Visits Date Type Provider Dept  12/21/19 Procedure visit Milinda Pointer, MD [facility]  Showing today's visits and meeting all other requirements Future Appointments Date Type Provider Dept  01/10/20 Appointment Milinda Pointer, MD [facility]  03/15/20 Appointment Milinda Pointer, MD [facility]  Showing future appointments within next 90 days and meeting all other requirements  Disposition: Discharge home  Discharge (Date  Time): 12/21/2019; 1154 hrs.   Primary Care Physician: Perrin Maltese, MD Location: [facility] Note by: Gaspar Cola, MD Date: 12/21/2019; Time: 1:00 PM  Disclaimer:  Medicine is not an Chief Strategy Officer. The only guarantee in medicine is that nothing is guaranteed. It is important to note that the decision to proceed with this intervention was based on the information collected from the patient. The Data and conclusions were drawn from the patient's questionnaire, the interview, and the physical examination. Because the information was provided in large part by the patient, it cannot be guaranteed that it has not been purposely or unconsciously manipulated. Every effort has been made to obtain as much relevant data as possible for this evaluation. It is important to note that the conclusions that lead to this procedure are derived in large part from the available data. Always take into account that the treatment will also be dependent on availability of resources and existing treatment guidelines, considered by other Pain Management Practitioners as being common knowledge and practice, at the time of the  intervention. For Medico-Legal purposes, it is also important to point out that variation in procedural techniques and pharmacological choices are the acceptable norm. The indications, contraindications, technique, and results of the above procedure should only be interpreted and judged by a Board-Certified Interventional Pain Specialist with extensive familiarity and expertise in the same exact procedure  and technique.

## 2019-12-22 ENCOUNTER — Telehealth: Payer: Self-pay

## 2019-12-22 LAB — COMP. METABOLIC PANEL (12)
AST: 15 IU/L (ref 0–40)
Albumin/Globulin Ratio: 1.8 (ref 1.2–2.2)
Albumin: 4 g/dL (ref 3.8–4.8)
Alkaline Phosphatase: 60 IU/L (ref 48–121)
BUN/Creatinine Ratio: 8 — ABNORMAL LOW (ref 12–28)
BUN: 11 mg/dL (ref 8–27)
Bilirubin Total: 0.2 mg/dL (ref 0.0–1.2)
Calcium: 8.8 mg/dL (ref 8.7–10.3)
Chloride: 100 mmol/L (ref 96–106)
Creatinine, Ser: 1.35 mg/dL — ABNORMAL HIGH (ref 0.57–1.00)
GFR calc Af Amer: 47 mL/min/{1.73_m2} — ABNORMAL LOW (ref >59)
GFR calc non Af Amer: 41 mL/min/{1.73_m2} — ABNORMAL LOW (ref >59)
Globulin, Total: 2.2 g/dL (ref 1.5–4.5)
Glucose: 81 mg/dL (ref 65–99)
Potassium: 4.5 mmol/L (ref 3.5–5.2)
Sodium: 140 mmol/L (ref 134–144)
Total Protein: 6.2 g/dL (ref 6.0–8.5)

## 2019-12-22 LAB — SEDIMENTATION RATE: Sed Rate: 6 mm/hr (ref 0–40)

## 2019-12-22 LAB — MAGNESIUM: Magnesium: 2.4 mg/dL — ABNORMAL HIGH (ref 1.6–2.3)

## 2019-12-22 LAB — 25-HYDROXY VITAMIN D LCMS D2+D3
25-Hydroxy, Vitamin D-2: <1 ng/mL
25-Hydroxy, Vitamin D-3: 53 ng/mL
25-Hydroxy, Vitamin D: 53 ng/mL

## 2019-12-22 LAB — C-REACTIVE PROTEIN: CRP: 4 mg/L (ref 0–10)

## 2019-12-22 LAB — VITAMIN B12: Vitamin B-12: 1986 pg/mL — ABNORMAL HIGH (ref 232–1245)

## 2019-12-22 NOTE — Telephone Encounter (Signed)
Denies any needs at this time. Instructed to call if needed. 

## 2019-12-27 ENCOUNTER — Encounter (HOSPITAL_COMMUNITY): Payer: Medicare Other

## 2019-12-27 ENCOUNTER — Ambulatory Visit: Payer: Medicare Other | Admitting: Surgery

## 2020-01-09 NOTE — Progress Notes (Signed)
PROVIDER NOTE: Information contained herein reflects review and annotations entered in association with encounter. Interpretation of such information and data should be left to medically-trained personnel. Information provided to patient can be located elsewhere in the medical record under "Patient Instructions". Document created using STT-dictation technology, any transcriptional errors that may result from process are unintentional.    Patient: Jacqueline Thornton  Service Category: E/M  Provider: Francisco A Naveira, MD  DOB: 06/17/1951  DOS: 01/10/2020  Specialty: Interventional Pain Management  MRN: [id number]  Setting: Ambulatory outpatient  PCP: Khan, Neelam S, MD  Type: Established Patient    Referring Provider: Khan, Neelam S, MD  Location: Office  Delivery: Face-to-face     HPI  Reason for encounter: Jacqueline Thornton, a 69 y.o. year old female, is here today for evaluation and management of her Chronic pain syndrome [G89.4]. Jacqueline Thornton's primary complain today is Back Pain (lower) and Hip Pain (left) Last encounter: Practice (12/22/2019). My last encounter with her was on 12/21/2019. Pertinent problems: Jacqueline Thornton has Chronic pain syndrome; Headache, migraine; Chronic low back pain (Secondary area of Pain) (Bilateral) (midline to tailbone) (R>L); Spondylosis of lumbar spine; Lumbar annular disc tear (L4-5); Discogenic low back pain (L3-4 and L4-5); Lumbar facet hypertrophy; Lumbar facet syndrome (Bilateral) (R>L); Chronic neck pain; Cervical spondylosis; Hx of cervical spine surgery; Cervical spinal fusion (C6-7 interbody fusion); Chronic sacroiliac joint pain (Bilateral) (L>R); Chronic lumbar radicular pain (Primary Area of Pain) (Left) (S1 Dermatome); Trochanteric bursitis of right hip; Chronic hip pain (Bilateral) (L>R); Neurogenic pain; Myofascial pain; Osteoarthritis of hip (Left); Arthrodesis status; DDD (degenerative disc disease), lumbar; Myalgia; Trochanteric bursitis of hip (Bilateral)  (L>R); Spondylosis without myelopathy or radiculopathy, lumbosacral region; Spondylosis without myelopathy or radiculopathy, sacral and sacrococcygeal region; Other specified dorsopathies, sacral and sacrococcygeal region; Age-related osteoporosis without current pathological fracture; Bone island of right femur; Inflammatory spondylopathy of lumbar region (HCC); Trigger point with back pain (Right); Fibromyalgia; DDD (degenerative disc disease), cervical; Spondylolisthesis of lumbar region; History of lumbar spinal fusion (Dr. Joseph Stern) (12/11/2018); and Failed back surgical syndrome on their pertinent problem list. Pain Assessment: Severity of Chronic pain is reported as a 3 /10. Location: Back Lower/denies. Onset: More than a month ago. Quality: Constant, Aching. Timing: Constant. Modifying factor(s): medications. Vitals:  height is 5' 1" (1.549 m) and weight is 150 lb (68 kg). Her temporal temperature is 97.4 F (36.3 C) (abnormal). Her blood pressure is 107/57 (abnormal) and her pulse is 87. Her respiration is 16 and oxygen saturation is 97%.   The patient indicates having done great after the left intra-articular hip joint injection.  The only thing that she has noticed is that now that that pain is gone, she has been able to notice more of the right lumbar facet pain.  Today she is positive for bilateral lumbar facet pain with right being worse than the left.  She would like to have that right sided injected as soon as possible.  Other than that, she refers that she has been able to walk without any problems, which she could not do before the hip injection.  Today I took the time to review the patient's x-rays and lab work.  She still having problems with her kidneys, which she is aware of.  We will let her PCP manage this part.  Sed rate and C-reactive protein came back within normal limits.  Hip and lumbar x-rays do not show any acute surgical pathology.  Post-Procedure Evaluation  Procedure:  Diagnostic   left IA hip injection #1 under fluoroscopic guidance and IV sedation Pre-procedure pain level: 5/10 Post-procedure: 0/10 (100% relief)  Sedation: Sedation provided.  Effectiveness during initial hour after procedure(Ultra-Short Term Relief): 100 %.  Local anesthetic used: Long-acting (4-6 hours) Effectiveness: Defined as any analgesic benefit obtained secondary to the administration of local anesthetics. This carries significant diagnostic value as to the etiological location, or anatomical origin, of the pain. Duration of benefit is expected to coincide with the duration of the local anesthetic used.  Effectiveness during initial 4-6 hours after procedure(Short-Term Relief): 100 %.  Long-term benefit: Defined as any relief past the pharmacologic duration of the local anesthetics.  Effectiveness past the initial 6 hours after procedure(Long-Term Relief): 90 %.  Current benefits: Defined as benefit that persist at this time.   Analgesia:  90% improved Function: Jacqueline Thornton reports improvement in function ROM: Jacqueline Thornton reports improvement in ROM  Pharmacotherapy Assessment   Analgesic: Oxycodone/APAP 7.5/325, 1 tab PO q 5 times a day. (56.25 mg/day of oxycodone) MME/day:56.25 mg/day.   Monitoring: [place] PMP: PDMP reviewed during this encounter.       Pharmacotherapy: No side-effects or adverse reactions reported. Compliance: No problems identified. Effectiveness: Clinically acceptable.  Wheatley, Dena L, RN  01/10/2020  9:46 AM  Sign when Signing Visit Safety precautions to be maintained throughout the outpatient stay will include: orient to surroundings, keep bed in low position, maintain call bell within reach at all times, provide assistance with transfer out of bed and ambulation.     UDS:  Summary  Date Value Ref Range Status  12/16/2019 Note  Final    Comment:    ==================================================================== [employer] [facility]  (MW) ==================================================================== Test                             Result       Flag       Units  Drug Present and Declared for Prescription Verification   Oxycodone                      3206         EXPECTED   ng/mg creat   Oxymorphone                    1043         EXPECTED   ng/mg creat   Noroxycodone                   3386         EXPECTED   ng/mg creat   Noroxymorphone                 175          EXPECTED   ng/mg creat    Sources of oxycodone are scheduled prescription medications.    Oxymorphone, noroxycodone, and noroxymorphone are expected    metabolites of oxycodone. Oxymorphone is also available as a    scheduled prescription medication.  ==================================================================== Test                      Result    Flag   Units      Ref Range   Creatinine              95               mg/dL      >=20 ====================================================================   Declared Medications:  The flagging and interpretation on this report are based on the  following declared medications.  Unexpected results may arise from  inaccuracies in the declared medications.   **Note: The testing scope of this panel includes these medications:   Oxycodone (Percocet)   **Note: The testing scope of this panel does not include the  following reported medications:   Acetaminophen (Percocet)  Cyclobenzaprine (Flexeril)  Duloxetine (Cymbalta)  Enalapril (Vasotec)  Esomeprazole (Nexium)  Estradiol (Estrace)  Gabapentin (Neurontin)  Hydrochlorothiazide (Hydrodiuril)  Melatonin  Rivaroxaban (Xarelto)  Rosuvastatin (Crestor)  Sertraline (Zoloft)  Vitamin B12  Vitamin D3 ==================================================================== For clinical consultation, please call [phone]. ====================================================================      ROS  Constitutional: Denies any fever or  chills Gastrointestinal: No reported hemesis, hematochezia, vomiting, or acute GI distress Musculoskeletal: Denies any acute onset joint swelling, redness, loss of ROM, or weakness Neurological: No reported episodes of acute onset apraxia, aphasia, dysarthria, agnosia, amnesia, paralysis, loss of coordination, or loss of consciousness  Medication Review  DULoxetine, Vitamin B-12, Vitamin D3, cyclobenzaprine, enalapril, esomeprazole, estradiol, gabapentin, hydrochlorothiazide, melatonin, oxyCODONE-acetaminophen, rosuvastatin, and sertraline  History Review  Allergy: Jacqueline Thornton has No Known Allergies. Drug: Jacqueline Thornton  reports no history of drug use. Alcohol:  reports no history of alcohol use. Tobacco:  reports that she quit smoking about 16 years ago. She has never used smokeless tobacco. Social: Jacqueline Thornton  reports that she quit smoking about 16 years ago. She has never used smokeless tobacco. She reports that she does not drink alcohol and does not use drugs. Medical:  has a past medical history of Absolute anemia (04/11/2015), Acute postoperative pain (08/07/2017), Angina pectoris ([place]), Anxiety, Arthritis, Arthropathy of sacroiliac joint (04/11/2015), Atypical face pain (04/24/2015), Back pain, CAD (coronary artery disease), Cancer ([place]), Chronic back pain, Depression, Fibromyalgia, GERD (gastroesophageal reflux disease), 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) (04/11/2015), H/O: hysterectomy (1979), Heart murmur, Hyperlipidemia, Hypertension, Low back pain (04/06/2015), Lumbar radicular pain (04/18/2015), Migraine, Narrowing of intervertebral disc space (04/11/2015), Sacroiliac joint pain (04/11/2015), and Spine disorder. Surgical: Jacqueline Thornton  has a past surgical history that includes Cholecystectomy; Abdominal hysterectomy; Neck surgery; Shoulder surgery; Colonoscopy with propofol (N/A, 07/19/2015); caract surger; Laparoscopic salpingo oophorectomy (Bilateral, 03/03/2018); Cystoscopy (03/03/2018);  Tonsillectomy; Appendectomy; Tumor removal; Anterior lumbar fusion (N/A, 12/11/2018); and Abdominal exposure (N/A, 12/11/2018). Family: family history includes Colon cancer in her maternal grandmother and mother; Diabetes in her father and mother; Hypertension in her sister; Stroke in her mother.  Laboratory Chemistry Profile   Renal Lab Results  Component Value Date   BUN 11 12/16/2019   CREATININE 1.35 (H) 12/16/2019   BCR 8 (L) 12/16/2019   GFRAA 47 (L) 12/16/2019   GFRNONAA 41 (L) 12/16/2019     Hepatic Lab Results  Component Value Date   AST 15 12/16/2019   ALT 14 03/08/2018   ALBUMIN 4.0 12/16/2019   ALKPHOS 60 12/16/2019     Electrolytes Lab Results  Component Value Date   NA 140 12/16/2019   K 4.5 12/16/2019   CL 100 12/16/2019   CALCIUM 8.8 12/16/2019   MG 2.4 (H) 12/16/2019     Bone Lab Results  Component Value Date   25OHVITD1 53 12/16/2019   25OHVITD2 <1.0 12/16/2019   25OHVITD3 53 12/16/2019     Inflammation (CRP: Acute Phase) (ESR: Chronic Phase) Lab Results  Component Value Date   CRP 4 12/16/2019   ESRSEDRATE 6 12/16/2019       Note: Above Lab results reviewed.  Recent Imaging  Review  DG PAIN CLINIC C-ARM 1-60 MIN NO REPORT Fluoro was used, but no Radiologist interpretation will be provided.  Please refer to "NOTES" tab for provider progress note. Note: Reviewed        Physical Exam  General appearance: Well nourished, well developed, and well hydrated. In no apparent acute distress Mental status: Alert, oriented x 3 (person, place, & time)       Respiratory: No evidence of acute respiratory distress Eyes: PERLA Vitals: BP (!) 107/57   Pulse 87   Temp (!) 97.4 F (36.3 C) (Temporal)   Resp 16   Ht 5' 1" (1.549 m)   Wt 150 lb (68 kg)   SpO2 97%   BMI 28.34 kg/m  BMI: Estimated body mass index is 28.34 kg/m as calculated from the following:   Height as of this encounter: 5' 1" (1.549 m).   Weight as of this encounter: 150 lb (68  kg). Ideal: Ideal body weight: 47.8 kg (105 lb 6.1 oz) Adjusted ideal body weight: 55.9 kg (123 lb 3.7 oz)  Assessment   Status Diagnosis  Controlled Controlled Controlled 1. Chronic pain syndrome   2. Chronic low back pain (Secondary area of Pain) (Bilateral) (midline to tailbone) (R>L)   3. Lumbar facet syndrome (Bilateral) (R>L)   4. Chronic hip pain (Bilateral) (L>R)      Updated Problems: Problem  Chronic anticoagulation (Xarelto) (Resolved)    Plan of Care  Problem-specific:  No problem-specific Assessment & Plan notes found for this encounter.  Jacqueline Thornton has a current medication list which includes the following long-term medication(s): vitamin b-12, cyclobenzaprine, duloxetine, enalapril, esomeprazole, gabapentin, hydrochlorothiazide, [START ON 01/17/2020] oxycodone-acetaminophen, [START ON 02/16/2020] oxycodone-acetaminophen, and sertraline.  Pharmacotherapy (Medications Ordered): No orders of the defined types were placed in this encounter.  Orders:  Orders Placed This Encounter  Procedures  . LUMBAR FACET(MEDIAL BRANCH NERVE BLOCK) MBNB    Standing Status:   Future    Standing Expiration Date:   02/10/2020    Scheduling Instructions:     Procedure: Lumbar facet block (AKA.: Lumbosacral medial branch nerve block)     Side: Right-sided     Level: L3-4, L4-5, & L5-S1 Facets (L2, L3, L4, L5, & S1 Medial Branch Nerves)     Sedation: With Sedation.     Timeframe: ASAA    Order Specific Question:   Where will this procedure be performed?    Answer:   [facility]   Follow-up plan:   Return for Procedure (w/ sedation): (R) L-FCT BLK.      Interventional management options:  Considering:   NOTE: XARELTO ANTICOAGULATION. (Stop: 3 days  Restart: 6 hrs) Diagnostic caudal epidural steroid injection + diagnostic epidurogram Diagnostic CESI Diagnostic bilateral cervical facet block Possible bilateral cervical facet RFA  Diagnostic bilateral lumbar  facet block Possible bilateral lumbar facet RFA  Diagnostic left L4-5 LESI  Diagnostic left S1 SNRB Diagnostic bilateral SI joint block Possible bilateral SI joint RFA  Diagnostic right trochanteric bursa injection   Palliative PRN treatment(s):   Palliativebilateral lumbar facet block #3+bilateral sacroiliac joint block#6 PalliativeCESI  Palliativebilateral cervical facet block Palliativeleft L4-5 LESI  Palliativeleft caudal ESI  Palliativeleft S1 SNRB Palliativebilateral SI joint block Palliativeright trochanteric bursa injection     Recent Visits Date Type Provider Dept  12/21/19 Procedure visit Milinda Pointer, MD [facility]  12/15/19 Telemedicine Milinda Pointer, MD [facility]  Showing recent visits within past 90 days and meeting all other requirements Today's  Visits Date Type Provider Dept  01/10/20 Office Visit Milinda Pointer, MD [facility]  Showing today's visits and meeting all other requirements Future Appointments Date Type Provider Dept  01/18/20 Appointment Milinda Pointer, MD [facility]  03/15/20 Appointment Milinda Pointer, MD [facility]  Showing future appointments within next 90 days and meeting all other requirements  I discussed the assessment and treatment plan with the patient. The patient was provided an opportunity to ask questions and all were answered. The patient agreed with the plan and demonstrated an understanding of the instructions.  Patient advised to call back or seek an in-person evaluation if the symptoms or condition worsens.  Duration of encounter: 30 minutes.  Note by: Gaspar Cola, MD Date: 01/10/2020; Time: 10:22 AM

## 2020-01-10 ENCOUNTER — Other Ambulatory Visit: Payer: Self-pay

## 2020-01-10 ENCOUNTER — Ambulatory Visit: Payer: Medicare Other | Attending: Pain Medicine | Admitting: Pain Medicine

## 2020-01-10 ENCOUNTER — Encounter: Payer: Self-pay | Admitting: Pain Medicine

## 2020-01-10 VITALS — BP 107/57 | HR 87 | Temp 97.4°F | Resp 16 | Ht 61.0 in | Wt 150.0 lb

## 2020-01-10 DIAGNOSIS — M47816 Spondylosis without myelopathy or radiculopathy, lumbar region: Secondary | ICD-10-CM | POA: Diagnosis present

## 2020-01-10 DIAGNOSIS — M25552 Pain in left hip: Secondary | ICD-10-CM | POA: Diagnosis present

## 2020-01-10 DIAGNOSIS — M545 Low back pain, unspecified: Secondary | ICD-10-CM

## 2020-01-10 DIAGNOSIS — G894 Chronic pain syndrome: Secondary | ICD-10-CM | POA: Diagnosis present

## 2020-01-10 DIAGNOSIS — M25551 Pain in right hip: Secondary | ICD-10-CM | POA: Diagnosis present

## 2020-01-10 DIAGNOSIS — G8929 Other chronic pain: Secondary | ICD-10-CM | POA: Insufficient documentation

## 2020-01-10 NOTE — Patient Instructions (Signed)
____________________________________________________________________________________________  Preparing for Procedure with Sedation  Procedure appointments are limited to planned procedures: . No Prescription Refills. . No disability issues will be discussed. . No medication changes will be discussed.  Instructions: . Oral Intake: Do not eat or drink anything for at least 8 hours prior to your procedure. (Exception: Blood Pressure Medication. See below.) . Transportation: Unless otherwise stated by your physician, you may drive yourself after the procedure. . Blood Pressure Medicine: Do not forget to take your blood pressure medicine with a sip of water the morning of the procedure. If your Diastolic (lower reading)is above 100 mmHg, elective cases will be cancelled/rescheduled. . Blood thinners: These will need to be stopped for procedures. Notify our staff if you are taking any blood thinners. Depending on which one you take, there will be specific instructions on how and when to stop it. . Diabetics on insulin: Notify the staff so that you can be scheduled 1st case in the morning. If your diabetes requires high dose insulin, take only  of your normal insulin dose the morning of the procedure and notify the staff that you have done so. . Preventing infections: Shower with an antibacterial soap the morning of your procedure. . Build-up your immune system: Take 1000 mg of Vitamin C with every meal (3 times a day) the day prior to your procedure. . Antibiotics: Inform the staff if you have a condition or reason that requires you to take antibiotics before dental procedures. . Pregnancy: If you are pregnant, call and cancel the procedure. . Sickness: If you have a cold, fever, or any active infections, call and cancel the procedure. . Arrival: You must be in the facility at least 30 minutes prior to your scheduled procedure. . Children: Do not bring children with you. . Dress appropriately:  Bring dark clothing that you would not mind if they get stained. . Valuables: Do not bring any jewelry or valuables.  Reasons to call and reschedule or cancel your procedure: (Following these recommendations will minimize the risk of a serious complication.) . Surgeries: Avoid having procedures within 2 weeks of any surgery. (Avoid for 2 weeks before or after any surgery). . Flu Shots: Avoid having procedures within 2 weeks of a flu shots or . (Avoid for 2 weeks before or after immunizations). . Barium: Avoid having a procedure within 7-10 days after having had a radiological study involving the use of radiological contrast. (Myelograms, Barium swallow or enema study). . Heart attacks: Avoid any elective procedures or surgeries for the initial 6 months after a "Myocardial Infarction" (Heart Attack). . Blood thinners: It is imperative that you stop these medications before procedures. Let us know if you if you take any blood thinner.  . Infection: Avoid procedures during or within two weeks of an infection (including chest colds or gastrointestinal problems). Symptoms associated with infections include: Localized redness, fever, chills, night sweats or profuse sweating, burning sensation when voiding, cough, congestion, stuffiness, runny nose, sore throat, diarrhea, nausea, vomiting, cold or Flu symptoms, recent or current infections. It is specially important if the infection is over the area that we intend to treat. . Heart and lung problems: Symptoms that may suggest an active cardiopulmonary problem include: cough, chest pain, breathing difficulties or shortness of breath, dizziness, ankle swelling, uncontrolled high or unusually low blood pressure, and/or palpitations. If you are experiencing any of these symptoms, cancel your procedure and contact your primary care physician for an evaluation.  Remember:  Regular Business hours are:    Monday to Thursday 8:00 AM to 4:00 PM  Provider's  Schedule: Tarea Skillman, MD:  Procedure days: Tuesday and Thursday 7:30 AM to 4:00 PM  Bilal Lateef, MD:  Procedure days: Monday and Wednesday 7:30 AM to 4:00 PM ____________________________________________________________________________________________   ____________________________________________________________________________________________  General Risks and Possible Complications  Patient Responsibilities: It is important that you read this as it is part of your informed consent. It is our duty to inform you of the risks and possible complications associated with treatments offered to you. It is your responsibility as a patient to read this and to ask questions about anything that is not clear or that you believe was not covered in this document.  Patient's Rights: You have the right to refuse treatment. You also have the right to change your mind, even after initially having agreed to have the treatment done. However, under this last option, if you wait until the last second to change your mind, you may be charged for the materials used up to that point.  Introduction: Medicine is not an exact science. Everything in Medicine, including the lack of treatment(s), carries the potential for danger, harm, or loss (which is by definition: Risk). In Medicine, a complication is a secondary problem, condition, or disease that can aggravate an already existing one. All treatments carry the risk of possible complications. The fact that a side effects or complications occurs, does not imply that the treatment was conducted incorrectly. It must be clearly understood that these can happen even when everything is done following the highest safety standards.  No treatment: You can choose not to proceed with the proposed treatment alternative. The "PRO(s)" would include: avoiding the risk of complications associated with the therapy. The "CON(s)" would include: not getting any of the treatment  benefits. These benefits fall under one of three categories: diagnostic; therapeutic; and/or palliative. Diagnostic benefits include: getting information which can ultimately lead to improvement of the disease or symptom(s). Therapeutic benefits are those associated with the successful treatment of the disease. Finally, palliative benefits are those related to the decrease of the primary symptoms, without necessarily curing the condition (example: decreasing the pain from a flare-up of a chronic condition, such as incurable terminal cancer).  General Risks and Complications: These are associated to most interventional treatments. They can occur alone, or in combination. They fall under one of the following six (6) categories: no benefit or worsening of symptoms; bleeding; infection; nerve damage; allergic reactions; and/or death. 1. No benefits or worsening of symptoms: In Medicine there are no guarantees, only probabilities. No healthcare provider can ever guarantee that a medical treatment will work, they can only state the probability that it may. Furthermore, there is always the possibility that the condition may worsen, either directly, or indirectly, as a consequence of the treatment. 2. Bleeding: This is more common if the patient is taking a blood thinner, either prescription or over the counter (example: Goody Powders, Fish oil, Aspirin, Garlic, etc.), or if suffering a condition associated with impaired coagulation (example: Hemophilia, cirrhosis of the liver, low platelet counts, etc.). However, even if you do not have one on these, it can still happen. If you have any of these conditions, or take one of these drugs, make sure to notify your treating physician. 3. Infection: This is more common in patients with a compromised immune system, either due to disease (example: diabetes, cancer, human immunodeficiency virus [HIV], etc.), or due to medications or treatments (example: therapies used to treat  cancer and   rheumatological diseases). However, even if you do not have one on these, it can still happen. If you have any of these conditions, or take one of these drugs, make sure to notify your treating physician. 4. Nerve Damage: This is more common when the treatment is an invasive one, but it can also happen with the use of medications, such as those used in the treatment of cancer. The damage can occur to small secondary nerves, or to large primary ones, such as those in the spinal cord and brain. This damage may be temporary or permanent and it may lead to impairments that can range from temporary numbness to permanent paralysis and/or brain death. 5. Allergic Reactions: Any time a substance or material comes in contact with our body, there is the possibility of an allergic reaction. These can range from a mild skin rash (contact dermatitis) to a severe systemic reaction (anaphylactic reaction), which can result in death. 6. Death: In general, any medical intervention can result in death, most of the time due to an unforeseen complication. ____________________________________________________________________________________________   

## 2020-01-10 NOTE — Progress Notes (Signed)
Safety precautions to be maintained throughout the outpatient stay will include: orient to surroundings, keep bed in low position, maintain call bell within reach at all times, provide assistance with transfer out of bed and ambulation.  

## 2020-01-18 ENCOUNTER — Other Ambulatory Visit: Payer: Self-pay

## 2020-01-18 ENCOUNTER — Ambulatory Visit
Admission: RE | Admit: 2020-01-18 | Discharge: 2020-01-18 | Disposition: A | Discharge status: Home / Self Care | Payer: Medicare Other | Source: Ambulatory Visit | Attending: Pain Medicine | Admitting: Pain Medicine

## 2020-01-18 ENCOUNTER — Ambulatory Visit (HOSPITAL_BASED_OUTPATIENT_CLINIC_OR_DEPARTMENT_OTHER): Payer: Medicare Other | Admitting: Pain Medicine

## 2020-01-18 ENCOUNTER — Encounter: Payer: Self-pay | Admitting: Pain Medicine

## 2020-01-18 VITALS — BP 143/65 | HR 83 | Resp 16 | Ht 60.0 in | Wt 150.0 lb

## 2020-01-18 DIAGNOSIS — M5136 Other intervertebral disc degeneration, lumbar region: Secondary | ICD-10-CM | POA: Insufficient documentation

## 2020-01-18 DIAGNOSIS — M47816 Spondylosis without myelopathy or radiculopathy, lumbar region: Secondary | ICD-10-CM | POA: Insufficient documentation

## 2020-01-18 DIAGNOSIS — G8929 Other chronic pain: Secondary | ICD-10-CM

## 2020-01-18 DIAGNOSIS — M47817 Spondylosis without myelopathy or radiculopathy, lumbosacral region: Secondary | ICD-10-CM | POA: Diagnosis present

## 2020-01-18 DIAGNOSIS — M545 Low back pain: Secondary | ICD-10-CM | POA: Insufficient documentation

## 2020-01-18 MED ORDER — LIDOCAINE HCL 2 % IJ SOLN
20.0000 mL | Freq: Once | INTRAMUSCULAR | Status: AC
  Administered 2020-01-18: 400 mg
  Filled 2020-01-18: qty 20

## 2020-01-18 MED ORDER — ROPIVACAINE HCL 2 MG/ML IJ SOLN
9.0000 mL | Freq: Once | INTRAMUSCULAR | Status: AC
  Administered 2020-01-18: 9 mL via PERINEURAL
  Filled 2020-01-18: qty 10

## 2020-01-18 MED ORDER — MIDAZOLAM HCL 5 MG/5ML IJ SOLN
1.0000 mg | INTRAMUSCULAR | Status: DC | PRN
  Administered 2020-01-18: 2 mg via INTRAVENOUS
  Filled 2020-01-18: qty 5

## 2020-01-18 MED ORDER — LACTATED RINGERS IV SOLN
1000.0000 mL | Freq: Once | INTRAVENOUS | Status: AC
  Administered 2020-01-18: 1000 mL via INTRAVENOUS

## 2020-01-18 MED ORDER — TRIAMCINOLONE ACETONIDE 40 MG/ML IJ SUSP
40.0000 mg | Freq: Once | INTRAMUSCULAR | Status: AC
  Administered 2020-01-18: 40 mg
  Filled 2020-01-18: qty 1

## 2020-01-18 MED ORDER — FENTANYL CITRATE (PF) 100 MCG/2ML IJ SOLN
25.0000 ug | INTRAMUSCULAR | Status: DC | PRN
  Administered 2020-01-18: 50 ug via INTRAVENOUS
  Filled 2020-01-18: qty 2

## 2020-01-18 NOTE — Progress Notes (Addendum)
PROVIDER NOTE: Information contained herein reflects review and annotations entered in association with encounter. Interpretation of such information and data should be left to medically-trained personnel. Information provided to patient can be located elsewhere in the medical record under "Patient Instructions". Document created using STT-dictation technology, any transcriptional errors that may result from process are unintentional.    Patient: Jacqueline Thornton  Service Category: Procedure  Provider: Gaspar Cola, MD  DOB: 02-Sep-1950  DOS: 01/18/2020  Location: [facility]  MRN: [id number]  Setting: Ambulatory - outpatient  Referring Provider: Perrin Maltese, MD  Type: Established Patient  Specialty: Interventional Pain Management  PCP: Perrin Maltese, MD   Primary Reason for Visit: Interventional Pain Management Treatment. CC: Back Pain  Procedure:          Anesthesia, Analgesia, Anxiolysis:  Type: Lumbar Facet, Medial Branch Block(s)          Primary Purpose: Palliative Region: Posterolateral Lumbosacral Spine Level: L2, L3, L4, L5, & S1 Medial Branch Level(s). Injecting these levels blocks the L3-4, L4-5, and L5-S1 lumbar facet joints. Laterality: Right  Type: Moderate (Conscious) Sedation combined with Local Anesthesia Indication(s): Analgesia and Anxiety Route: Intravenous (IV) IV Access: Secured Sedation: Meaningful verbal contact was maintained at all times during the procedure  Local Anesthetic: Lidocaine 1-2%  Position: Prone   Indications: 1. Spondylosis without myelopathy or radiculopathy, lumbosacral region   2. Lumbar facet syndrome (Bilateral) (R>L)   3. Lumbar facet hypertrophy   4. DDD (degenerative disc disease), lumbar   5. Chronic low back pain (Secondary area of Pain) (Bilateral) (midline to tailbone) (R>L)    Pain Score: Pre-procedure: 3 /10 Post-procedure: 2 /10   Pre-op Assessment:  Jacqueline Thornton is a 69 y.o. (year old), female patient,  seen today for interventional treatment. She  has a past surgical history that includes Cholecystectomy; Abdominal hysterectomy; Neck surgery; Shoulder surgery; Colonoscopy with propofol (N/A, 07/19/2015); caract surger; Laparoscopic salpingo oophorectomy (Bilateral, 03/03/2018); Cystoscopy (03/03/2018); Tonsillectomy; Appendectomy; Tumor removal; Anterior lumbar fusion (N/A, 12/11/2018); and Abdominal exposure (N/A, 12/11/2018). Jacqueline Thornton has a current medication list which includes the following prescription(s): vitamin d3, vitamin b-12, cyclobenzaprine, duloxetine, enalapril, esomeprazole, estradiol, gabapentin, melatonin, oxycodone-acetaminophen, [START ON 02/16/2020] oxycodone-acetaminophen, rosuvastatin, sertraline, hydrochlorothiazide, and sertraline, and the following Facility-Administered Medications: fentanyl and midazolam. Her primarily concern today is the Back Pain  Initial Vital Signs:  Pulse/HCG Rate: 83ECG Heart Rate: 75 Temp:   Resp: 18 BP: 114/80 SpO2: (!) 88 %  BMI: Estimated body mass index is 29.29 kg/m as calculated from the following:   Height as of this encounter: 5' (1.524 m).   Weight as of this encounter: 150 lb (68 kg).  Risk Assessment: Allergies: Reviewed. She has No Known Allergies.  Allergy Precautions: None required Coagulopathies: Reviewed. None identified.  Blood-thinner therapy: None at this time Active Infection(s): Reviewed. None identified. Jacqueline Thornton is afebrile  Site Confirmation: Jacqueline Thornton was asked to confirm the procedure and laterality before marking the site Procedure checklist: Completed Consent: Before the procedure and under the influence of no sedative(s), amnesic(s), or anxiolytics, the patient was informed of the treatment options, risks and possible complications. To fulfill our ethical and legal obligations, as recommended by the American Medical Association's Code of Ethics, I have informed the patient of my clinical impression; the nature  and purpose of the treatment or procedure; the risks, benefits, and possible complications of the intervention; the alternatives, including doing nothing; the risk(s) and benefit(s) of the alternative treatment(s) or procedure(s);  and the risk(s) and benefit(s) of doing nothing. The patient was provided information about the general risks and possible complications associated with the procedure. These may include, but are not limited to: failure to achieve desired goals, infection, bleeding, organ or nerve damage, allergic reactions, paralysis, and death. In addition, the patient was informed of those risks and complications associated to Spine-related procedures, such as failure to decrease pain; infection (i.e.: Meningitis, epidural or intraspinal abscess); bleeding (i.e.: epidural hematoma, subarachnoid hemorrhage, or any other type of intraspinal or peri-dural bleeding); organ or nerve damage (i.e.: Any type of peripheral nerve, nerve root, or spinal cord injury) with subsequent damage to sensory, motor, and/or autonomic systems, resulting in permanent pain, numbness, and/or weakness of one or several areas of the body; allergic reactions; (i.e.: anaphylactic reaction); and/or death. Furthermore, the patient was informed of those risks and complications associated with the medications. These include, but are not limited to: allergic reactions (i.e.: anaphylactic or anaphylactoid reaction(s)); adrenal axis suppression; blood sugar elevation that in diabetics may result in ketoacidosis or comma; water retention that in patients with history of congestive heart failure may result in shortness of breath, pulmonary edema, and decompensation with resultant heart failure; weight gain; swelling or edema; medication-induced neural toxicity; particulate matter embolism and blood vessel occlusion with resultant organ, and/or nervous system infarction; and/or aseptic necrosis of one or more joints. Finally, the patient  was informed that Medicine is not an exact science; therefore, there is also the possibility of unforeseen or unpredictable risks and/or possible complications that may result in a catastrophic outcome. The patient indicated having understood very clearly. We have given the patient no guarantees and we have made no promises. Enough time was given to the patient to ask questions, all of which were answered to the patient's satisfaction. Ms. Strickler has indicated that she wanted to continue with the procedure. Attestation: I, the ordering provider, attest that I have discussed with the patient the benefits, risks, side-effects, alternatives, likelihood of achieving goals, and potential problems during recovery for the procedure that I have provided informed consent. Date  Time: 01/18/2020  9:49 AM  Pre-Procedure Preparation:  Monitoring: As per clinic protocol. Respiration, ETCO2, SpO2, BP, heart rate and rhythm monitor placed and checked for adequate function Safety Precautions: Patient was assessed for positional comfort and pressure points before starting the procedure. Time-out: I initiated and conducted the "Time-out" before starting the procedure, as per protocol. The patient was asked to participate by confirming the accuracy of the "Time Out" information. Verification of the correct person, site, and procedure were performed and confirmed by me, the nursing staff, and the patient. "Time-out" conducted as per Joint Commission's Universal Protocol (UP.01.01.01). Time: 1028  Description of Procedure:          Laterality: Right Levels:  L2, L3, L4, L5, & S1 Medial Branch Level(s) Area Prepped: Posterior Lumbosacral Region DuraPrep (Iodine Povacrylex [0.7% available iodine] and Isopropyl Alcohol, 74% w/w) Safety Precautions: Aspiration looking for blood return was conducted prior to all injections. At no point did we inject any substances, as a needle was being advanced. Before injecting, the patient  was told to immediately notify me if she was experiencing any new onset of "ringing in the ears, or metallic taste in the mouth". No attempts were made at seeking any paresthesias. Safe injection practices and needle disposal techniques used. Medications properly checked for expiration dates. SDV (single dose vial) medications used. After the completion of the procedure, all disposable equipment used was  discarded in the proper designated medical waste containers. Local Anesthesia: Protocol guidelines were followed. The patient was positioned over the fluoroscopy table. The area was prepped in the usual manner. The time-out was completed. The target area was identified using fluoroscopy. A 12-in long, straight, sterile hemostat was used with fluoroscopic guidance to locate the targets for each level blocked. Once located, the skin was marked with an approved surgical skin marker. Once all sites were marked, the skin (epidermis, dermis, and hypodermis), as well as deeper tissues (fat, connective tissue and muscle) were infiltrated with a small amount of a short-acting local anesthetic, loaded on a 10cc syringe with a 25G, 1.5-in  Needle. An appropriate amount of time was allowed for local anesthetics to take effect before proceeding to the next step. Local Anesthetic: Lidocaine 2.0% The unused portion of the local anesthetic was discarded in the proper designated containers. Technical explanation of process:  L2 Medial Branch Nerve Block (MBB): The target area for the L2 medial branch is at the junction of the postero-lateral aspect of the superior articular process and the superior, posterior, and medial edge of the transverse process of L3. Under fluoroscopic guidance, a Quincke needle was inserted until contact was made with os over the superior postero-lateral aspect of the pedicular shadow (target area). After negative aspiration for blood, 0.5 mL of the nerve block solution was injected without difficulty  or complication. The needle was removed intact. L3 Medial Branch Nerve Block (MBB): The target area for the L3 medial branch is at the junction of the postero-lateral aspect of the superior articular process and the superior, posterior, and medial edge of the transverse process of L4. Under fluoroscopic guidance, a Quincke needle was inserted until contact was made with os over the superior postero-lateral aspect of the pedicular shadow (target area). After negative aspiration for blood, 0.5 mL of the nerve block solution was injected without difficulty or complication. The needle was removed intact. L4 Medial Branch Nerve Block (MBB): The target area for the L4 medial branch is at the junction of the postero-lateral aspect of the superior articular process and the superior, posterior, and medial edge of the transverse process of L5. Under fluoroscopic guidance, a Quincke needle was inserted until contact was made with os over the superior postero-lateral aspect of the pedicular shadow (target area). After negative aspiration for blood, 0.5 mL of the nerve block solution was injected without difficulty or complication. The needle was removed intact. L5 Medial Branch Nerve Block (MBB): The target area for the L5 medial branch is at the junction of the postero-lateral aspect of the superior articular process and the superior, posterior, and medial edge of the sacral ala. Under fluoroscopic guidance, a Quincke needle was inserted until contact was made with os over the superior postero-lateral aspect of the pedicular shadow (target area). After negative aspiration for blood, 0.5 mL of the nerve block solution was injected without difficulty or complication. The needle was removed intact. S1 Medial Branch Nerve Block (MBB): The target area for the S1 medial branch is at the posterior and inferior 6 o'clock position of the L5-S1 facet joint. Under fluoroscopic guidance, the Quincke needle inserted for the L5 MBB was  redirected until contact was made with os over the inferior and postero aspect of the sacrum, at the 6 o' clock position under the L5-S1 facet joint (Target area). After negative aspiration for blood, 0.5 mL of the nerve block solution was injected without difficulty or complication. The needle was removed  intact.  Nerve block solution: 0.2% PF-Ropivacaine + Triamcinolone (40 mg/mL) diluted to a final concentration of 4 mg of Triamcinolone/mL of Ropivacaine The unused portion of the solution was discarded in the proper designated containers. Procedural Needles: 22-gauge, 3.5-inch, Quincke needles used for all levels.  Once the entire procedure was completed, the treated area was cleaned, making sure to leave some of the prepping solution back to take advantage of its long term bactericidal properties.   Illustration of the posterior view of the lumbar spine and the posterior neural structures. Laminae of L2 through S1 are labeled. DPRL5, dorsal primary ramus of L5; DPRS1, dorsal primary ramus of S1; DPR3, dorsal primary ramus of L3; FJ, facet (zygapophyseal) joint L3-L4; I, inferior articular process of L4; LB1, lateral branch of dorsal primary ramus of L1; IAB, inferior articular branches from L3 medial branch (supplies L4-L5 facet joint); IBP, intermediate branch plexus; MB3, medial branch of dorsal primary ramus of L3; NR3, third lumbar nerve root; S, superior articular process of L5; SAB, superior articular branches from L4 (supplies L4-5 facet joint also); TP3, transverse process of L3.  Vitals:   01/18/20 1033 01/18/20 1043 01/18/20 1053 01/18/20 1105  BP: 110/61 (!) 111/58 110/70 (!) 143/65  Pulse:      Resp: (!) 9 12 15 16   SpO2: 100% 99% 100% 100%  Weight:      Height:         Start Time: 1028 hrs. End Time: 1033 hrs.  Imaging Guidance (Spinal):          Type of Imaging Technique: Fluoroscopy Guidance (Spinal) Indication(s): Assistance in needle guidance and placement for procedures  requiring needle placement in or near specific anatomical locations not easily accessible without such assistance. Exposure Time: Please see nurses notes. Contrast: None used. Fluoroscopic Guidance: I was personally present during the use of fluoroscopy. "Tunnel Vision Technique" used to obtain the best possible view of the target area. Parallax error corrected before commencing the procedure. "Direction-depth-direction" technique used to introduce the needle under continuous pulsed fluoroscopy. Once target was reached, antero-posterior, oblique, and lateral fluoroscopic projection used confirm needle placement in all planes. Images permanently stored in EMR. Interpretation: No contrast injected. I personally interpreted the imaging intraoperatively. Adequate needle placement confirmed in multiple planes. Permanent images saved into the patient's record.  Antibiotic Prophylaxis:   Anti-infectives (From admission, onward)   None     Indication(s): None identified  Post-operative Assessment:  Post-procedure Vital Signs:  Pulse/HCG Rate: 8373 Temp:   Resp: 16 BP: (!) 143/65 SpO2: 100 %  EBL: None  Complications: No immediate post-treatment complications observed by team, or reported by patient.  Note: The patient tolerated the entire procedure well. A repeat set of vitals were taken after the procedure and the patient was kept under observation following institutional policy, for this type of procedure. Post-procedural neurological assessment was performed, showing return to baseline, prior to discharge. The patient was provided with post-procedure discharge instructions, including a section on how to identify potential problems. Should any problems arise concerning this procedure, the patient was given instructions to immediately contact us, at any time, without hesitation. In any case, we plan to contact the patient by telephone for a follow-up status report regarding this interventional  procedure.  Comments:  No additional relevant information.  Plan of Care  Orders:  Orders Placed This Encounter  Procedures  . LUMBAR FACET(MEDIAL BRANCH NERVE BLOCK) MBNB    Scheduling Instructions:     Procedure: Lumbar facet block (AKA.:  Lumbosacral medial branch nerve block)     Side: Right-sided     Level: L3-4, L4-5, & L5-S1 Facets (L2, L3, L4, L5, & S1 Medial Branch Nerves)     Sedation: Patient's choice.     Timeframe: Today    Order Specific Question:   Where will this procedure be performed?    Answer:   [facility]  . [facility] C-ARM 1-60 MIN NO REPORT    Intraoperative interpretation by procedural physician at [facility].    Standing Status:   Standing    Number of Occurrences:   1    Order Specific Question:   Reason for exam:    Answer:   Assistance in needle guidance and placement for procedures requiring needle placement in or near specific anatomical locations not easily accessible without such assistance.  . Informed Consent Details: Physician/Practitioner Attestation; Transcribe to consent form and obtain patient signature    Nursing Order: Transcribe to consent form and obtain patient signature. Note: Always confirm laterality of pain with Ms. Woodle, before procedure. Procedure: Lumbar Facet Block  under fluoroscopic guidance Indication/Reason: Low Back Pain, with our without leg pain, due to Facet Joint Arthralgia (Joint Pain) known as Lumbar Facet Syndrome, secondary to Lumbar, and/or Lumbosacral Spondylosis (Arthritis of the Spine), without myelopathy or radiculopathy (Nerve Damage). Provider Attestation: I, [place] Dossie Arbour, MD, (Pain Management Specialist), the physician/practitioner, attest that I have discussed with the patient the benefits, risks, side effects, alternatives, likelihood of achieving goals and potential problems during recovery for the procedure that I have provided informed consent.  . Provide equipment /  supplies at bedside    Equipment required: Single use, disposable, "Block Tray"    Standing Status:   Standing    Number of Occurrences:   1    Order Specific Question:   Specify    Answer:   Block Tray   Chronic Opioid Analgesic:  Oxycodone/APAP 7.5/325, 1 tab PO q 5 times a day. (56.25 mg/day of oxycodone) MME/day:56.25 mg/day.   Medications ordered for procedure: Meds ordered this encounter  Medications  . lidocaine (XYLOCAINE) 2 % (with pres) injection 400 mg  . lactated ringers infusion 1,000 mL  . midazolam (VERSED) 5 MG/5ML injection 1-2 mg    Make sure Flumazenil is available in the pyxis when using this medication. If oversedation occurs, administer 0.2 mg IV over 15 sec. If after 45 sec no response, administer 0.2 mg again over 1 min; may repeat at 1 min intervals; not to exceed 4 doses (1 mg)  . fentaNYL (SUBLIMAZE) injection 25-50 mcg    Make sure Narcan is available in the pyxis when using this medication. In the event of respiratory depression (RR< 8/min): Titrate NARCAN (naloxone) in increments of 0.1 to 0.2 mg IV at 2-3 minute intervals, until desired degree of reversal.  . ropivacaine (PF) 2 mg/mL (0.2%) (NAROPIN) injection 9 mL  . triamcinolone acetonide (KENALOG-40) injection 40 mg   Medications administered: We administered lidocaine, lactated ringers, midazolam, fentaNYL, ropivacaine (PF) 2 mg/mL (0.2%), and triamcinolone acetonide.  See the medical record for exact dosing, route, and time of administration.  Follow-up plan:   Return in about 2 weeks (around 02/01/2020) for 15-min, VV, PP-e/m (PM on procedure day).       Interventional management options:  Considering:   NOTE: XARELTO ANTICOAGULATION. (Stop: 3 days  Restart: 6 hrs) Diagnostic caudal epidural steroid injection + diagnostic epidurogram Diagnostic CESI Diagnostic bilateral cervical facet block Possible bilateral cervical  facet RFA  Diagnostic bilateral lumbar facet block Possible bilateral  lumbar facet RFA  Diagnostic left L4-5 LESI  Diagnostic left S1 SNRB Diagnostic bilateral SI joint block Possible bilateral SI joint RFA  Diagnostic right trochanteric bursa injection   Palliative PRN treatment(s):   Palliativebilateral lumbar facet block #3+bilateral sacroiliac joint block#6 PalliativeCESI  Palliativebilateral cervical facet block Palliativeleft L4-5 LESI  Palliativeleft caudal ESI  Palliativeleft S1 SNRB Palliativebilateral SI joint block Palliativeright trochanteric bursa injection    Recent Visits Date Type Provider Dept  01/10/20 Office Visit Milinda Pointer, MD [facility]  12/21/19 Procedure visit Milinda Pointer, MD [facility]  12/15/19 Telemedicine Milinda Pointer, MD [facility]  Showing recent visits within past 90 days and meeting all other requirements Today's Visits Date Type Provider Dept  01/18/20 Procedure visit Milinda Pointer, MD [facility]  Showing today's visits and meeting all other requirements Future Appointments Date Type Provider Dept  02/01/20 Appointment Milinda Pointer, MD [facility]  03/15/20 Appointment Milinda Pointer, MD [facility]  Showing future appointments within next 90 days and meeting all other requirements  Disposition: Discharge home  Discharge (Date  Time): 01/18/2020; 1105 hrs.   Primary Care Physician: Perrin Maltese, MD Location: [facility] Note by: Gaspar Cola, MD Date: 01/18/2020; Time: 12:15 PM  Disclaimer:  Medicine is not an Chief Strategy Officer. The only guarantee in medicine is that nothing is guaranteed. It is important to note that the decision to proceed with this intervention was based on the information collected from the patient. The Data and conclusions were drawn from the patient's questionnaire, the interview, and the physical examination. Because the information was  provided in large part by the patient, it cannot be guaranteed that it has not been purposely or unconsciously manipulated. Every effort has been made to obtain as much relevant data as possible for this evaluation. It is important to note that the conclusions that lead to this procedure are derived in large part from the available data. Always take into account that the treatment will also be dependent on availability of resources and existing treatment guidelines, considered by other Pain Management Practitioners as being common knowledge and practice, at the time of the intervention. For Medico-Legal purposes, it is also important to point out that variation in procedural techniques and pharmacological choices are the acceptable norm. The indications, contraindications, technique, and results of the above procedure should only be interpreted and judged by a Board-Certified Interventional Pain Specialist with extensive familiarity and expertise in the same exact procedure and technique.

## 2020-01-18 NOTE — Patient Instructions (Signed)

## 2020-01-18 NOTE — Progress Notes (Signed)
Safety precautions to be maintained throughout the outpatient stay will include: orient to surroundings, keep bed in low position, maintain call bell within reach at all times, provide assistance with transfer out of bed and ambulation.  

## 2020-01-19 ENCOUNTER — Telehealth: Payer: Self-pay

## 2020-01-19 NOTE — Telephone Encounter (Signed)
Post procedure phone call.  Patient states she is doing great 

## 2020-01-31 ENCOUNTER — Telehealth: Payer: Self-pay

## 2020-01-31 NOTE — Telephone Encounter (Signed)
LM for patient to call us back to go over pre virtual appointment questions

## 2020-02-01 ENCOUNTER — Ambulatory Visit: Payer: Medicare Other | Attending: Pain Medicine | Admitting: Pain Medicine

## 2020-02-01 ENCOUNTER — Other Ambulatory Visit: Payer: Self-pay

## 2020-02-01 DIAGNOSIS — M545 Low back pain: Secondary | ICD-10-CM | POA: Diagnosis not present

## 2020-02-01 DIAGNOSIS — M5136 Other intervertebral disc degeneration, lumbar region: Secondary | ICD-10-CM

## 2020-02-01 DIAGNOSIS — M47817 Spondylosis without myelopathy or radiculopathy, lumbosacral region: Secondary | ICD-10-CM

## 2020-02-01 DIAGNOSIS — M47816 Spondylosis without myelopathy or radiculopathy, lumbar region: Secondary | ICD-10-CM

## 2020-02-01 DIAGNOSIS — G8929 Other chronic pain: Secondary | ICD-10-CM

## 2020-02-01 NOTE — Progress Notes (Signed)
Patient: Jacqueline Thornton  Service Category: E/M  Provider: Gaspar Cola, MD  DOB: [birth year]  DOS: 02/01/2020  Location: Office  MRN: [id number]  Setting: Ambulatory outpatient  Referring Provider: Perrin Maltese, MD  Type: Established Patient  Specialty: Interventional Pain Management  PCP: Perrin Maltese, MD  Location: Remote location  Delivery: TeleHealth     Virtual Encounter - Pain Management PROVIDER NOTE: Information contained herein reflects review and annotations entered in association with encounter. Interpretation of such information and data should be left to medically-trained personnel. Information provided to patient can be located elsewhere in the medical record under "Patient Instructions". Document created using STT-dictation technology, any transcriptional errors that may result from process are unintentional.    Contact & Pharmacy Preferred: [phone] Home: [phone] (home) Mobile: [phone] (mobile) E-mail: [email or web address]  [facility] ([facility] [place] ([place]) [place] [address] Phone: [phone] Fax: [phone]   Pre-screening  Jacqueline Thornton offered "in-person" vs "virtual" encounter. She indicated preferring virtual for this encounter.   Reason COVID-19*  Social distancing based on CDC and AMA recommendations.   I contacted [phone] on 02/01/2020 via telephone.      I clearly identified myself as 21/08/2019, MD. I verified that I was speaking with the correct person using two identifiers (Name: Jacqueline Thornton, and date of birth: [birth year]).  Consent I sought verbal advanced consent from 03/07/1951 for virtual visit interactions. I informed Jacqueline Thornton of possible security and privacy concerns, risks, and limitations associated with providing "not-in-person" medical evaluation and management services. I also informed Ms. Ake of the availability  of "in-person" appointments. Finally, I informed her that there would be a charge for the virtual visit and that she could be  personally, fully or partially, financially responsible for it. Jacqueline Thornton expressed understanding and agreed to proceed.   Historic Elements   Jacqueline Thornton is a 69 y.o. year old, female patient evaluated today after her last contact with our practice on 01/31/2020. 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 She also  has a past surgical history that includes Cholecystectomy; Abdominal hysterectomy; Neck surgery; Shoulder surgery; Colonoscopy with propofol (N/A, 07/19/2015); caract surger; Laparoscopic salpingo oophorectomy (Bilateral, 03/03/2018); Cystoscopy (03/03/2018); Tonsillectomy; Appendectomy; Tumor removal; Anterior lumbar fusion (N/A, 12/11/2018); and Abdominal exposure (N/A, 12/11/2018). Jacqueline Thornton has a current medication list which includes the following prescription(s): vitamin d3, vitamin b-12, cyclobenzaprine, duloxetine, enalapril, esomeprazole, estradiol, gabapentin, melatonin, oxycodone-acetaminophen, [START ON 02/16/2020] oxycodone-acetaminophen, rosuvastatin, and sertraline. She  reports that she quit smoking about 16 years ago. She has never used smokeless tobacco. She reports that she does not drink alcohol and does not use drugs. Jacqueline Thornton  has No Known Allergies.   HPI  Today, she is being contacted for a post-procedure assessment.  The patient refers having done extremely well with the procedure.  Of course, she indicates that she is having absolutely no pain on the right side where she had the lumbar facet blocks done.  However the left side and the left hip is giving her some problems.  I asked her if she wanted for [place] to bring her in to do the opposite side and she indicated that she is doing well at this time and does not need anything done.  The patient was reminded to just give [place] a call if she needs this again.  Post-Procedure Evaluation  Procedure (01/18/2020): Palliative right lumbar facet block under fluoroscopic guidance and IV sedation Pre-procedure pain level: 3/10 Post-procedure: 2/10 (< 50% relief)  Sedation: Sedation provided.  Effectiveness during initial hour after procedure(Ultra-Short Term Relief): 100 %.  Local anesthetic used: Long-acting (4-6 hours) Effectiveness: Defined as any analgesic benefit obtained secondary to the administration of local anesthetics. This carries significant diagnostic value as to the etiological location, or anatomical origin, of the pain. Duration of benefit is expected to coincide with the duration of the local anesthetic used.  Effectiveness during initial 4-6 hours after procedure(Short-Term Relief): 100 %.  Long-term benefit: Defined as any relief past the pharmacologic duration of the local anesthetics.  Effectiveness past the initial 6 hours after procedure(Long-Term Relief): 80 %.  Current benefits: Defined as benefit that persist at this time.   Analgesia:  80% improved Function: Jacqueline Thornton reports improvement in function ROM: Jacqueline Thornton reports improvement in ROM  Pharmacotherapy Assessment  Analgesic: Oxycodone/APAP 7.5/325, 1 tab PO q 5 times a day. (56.25 mg/day of oxycodone) MME/day:56.25 mg/day.   Monitoring: [place] PMP: PDMP reviewed during this encounter.        Pharmacotherapy: No side-effects or adverse reactions reported. Compliance: No problems identified. Effectiveness: Clinically acceptable. Plan: Refer to "POC".  UDS:  Summary  Date Value Ref Range Status  12/16/2019 Note  Final    Comment:    ==================================================================== [employer] [facility] 13 (MW) ==================================================================== Test                             Result       Flag       Units  Drug Present and Declared for Prescription Verification   Oxycodone                      3206         EXPECTED   ng/mg creat   Oxymorphone                    1043         EXPECTED   ng/mg creat   Noroxycodone                   3386         EXPECTED   ng/mg creat   Noroxymorphone                 175          EXPECTED   ng/mg creat    Sources of oxycodone are scheduled prescription medications.    Oxymorphone, noroxycodone, and noroxymorphone are expected    metabolites of oxycodone. Oxymorphone is also available as a    scheduled prescription medication.  ==================================================================== Test                      Result    Flag   Units      Ref Range   Creatinine              95  mg/dL      >=20 ==================================================================== Declared Medications:  The flagging and interpretation on this report are based on the  following declared medications.  Unexpected results may arise from  inaccuracies in the declared medications.   **Note: The testing scope of this panel includes these medications:   Oxycodone (Percocet)   **Note: The testing scope of this panel does not include the  following reported medications:   Acetaminophen (Percocet)  Cyclobenzaprine (Flexeril)  Duloxetine (Cymbalta)  Enalapril (Vasotec)  Esomeprazole (Nexium)  Estradiol (Estrace)  Gabapentin (Neurontin)  Hydrochlorothiazide (Hydrodiuril)  Melatonin   Rivaroxaban (Xarelto)  Rosuvastatin (Crestor)  Sertraline (Zoloft)  Vitamin B12  Vitamin D3 ==================================================================== For clinical consultation, please call [phone]. ====================================================================     Laboratory Chemistry Profile   Renal Lab Results  Component Value Date   BUN 11 12/16/2019   CREATININE 1.35 (H) 12/16/2019   BCR 8 (L) 12/16/2019   GFRAA 47 (L) 12/16/2019   GFRNONAA 41 (L) 12/16/2019     Hepatic Lab Results  Component Value Date   AST 15 12/16/2019   ALT 14 03/08/2018   ALBUMIN 4.0 12/16/2019   ALKPHOS 60 12/16/2019     Electrolytes Lab Results  Component Value Date   NA 140 12/16/2019   K 4.5 12/16/2019   CL 100 12/16/2019   CALCIUM 8.8 12/16/2019   MG 2.4 (H) 12/16/2019     Bone Lab Results  Component Value Date   25OHVITD1 53 12/16/2019   25OHVITD2 <1.0 12/16/2019   25OHVITD3 53 12/16/2019     Inflammation (CRP: Acute Phase) (ESR: Chronic Phase) Lab Results  Component Value Date   CRP 4 12/16/2019   ESRSEDRATE 6 12/16/2019       Note: Above Lab results reviewed.   Imaging  DG PAIN CLINIC C-ARM 1-60 MIN NO REPORT Fluoro was used, but no Radiologist interpretation will be provided.  Please refer to "NOTES" tab for provider progress note.  Assessment  The primary encounter diagnosis was Spondylosis without myelopathy or radiculopathy, lumbosacral region. Diagnoses of Lumbar facet syndrome (Bilateral) (R>L), Lumbar facet hypertrophy, DDD (degenerative disc disease), lumbar, and Chronic low back pain (Secondary area of Pain) (Bilateral) (midline to tailbone) (R>L) were also pertinent to this visit.  Plan of Care  Problem-specific:  No problem-specific Assessment & Plan notes found for this encounter.  Jacqueline Thornton has a current medication list which includes the following long-term medication(s): vitamin b-12, cyclobenzaprine, duloxetine,  enalapril, esomeprazole, gabapentin, oxycodone-acetaminophen, [START ON 02/16/2020] oxycodone-acetaminophen, and sertraline.  Pharmacotherapy (Medications Ordered): No orders of the defined types were placed in this encounter.  Orders:  No orders of the defined types were placed in this encounter.  Follow-up plan:   Return for regular appointment.      Interventional management options:  Considering:   NOTE: XARELTO ANTICOAGULATION. (Stop: 3 days  Restart: 6 hrs) Diagnostic caudal epidural steroid injection + diagnostic epidurogram Diagnostic CESI Diagnostic bilateral cervical facet block Possible bilateral cervical facet RFA  Diagnostic bilateral lumbar facet block Possible bilateral lumbar facet RFA  Diagnostic left L4-5 LESI  Diagnostic left S1 SNRB Diagnostic bilateral SI joint block Possible bilateral SI joint RFA  Diagnostic right trochanteric bursa injection   Palliative PRN treatment(s):   Palliativebilateral lumbar facet block #3+bilateral sacroiliac joint block#6 PalliativeCESI  Palliativebilateral cervical facet block Palliativeleft L4-5 LESI  Palliativeleft caudal ESI  Palliativeleft S1 SNRB Palliativebilateral SI joint block Palliativeright trochanteric bursa injection     Recent Visits Date Type Provider Dept  01/18/20 Procedure  visit Milinda Pointer, MD [facility]  01/10/20 Office Visit Milinda Pointer, MD [facility]  12/21/19 Procedure visit Milinda Pointer, MD [facility]  12/15/19 Telemedicine Milinda Pointer, MD [facility]  Showing recent visits within past 90 days and meeting all other requirements Today's Visits Date Type Provider Dept  02/01/20 Telemedicine Milinda Pointer, MD [facility]  Showing today's visits and meeting all other requirements Future Appointments Date Type Provider Dept  03/15/20 Appointment Milinda Pointer, MD [facility]   Showing future appointments within next 90 days and meeting all other requirements  I discussed the assessment and treatment plan with the patient. The patient was provided an opportunity to ask questions and all were answered. The patient agreed with the plan and demonstrated an understanding of the instructions.  Patient advised to call back or seek an in-person evaluation if the symptoms or condition worsens.  Duration of encounter: 12 minutes.  Note by: Gaspar Cola, MD Date: 02/01/2020; Time: 5:52 PM

## 2020-02-04 DIAGNOSIS — M25552 Pain in left hip: Secondary | ICD-10-CM | POA: Insufficient documentation

## 2020-02-04 DIAGNOSIS — G8929 Other chronic pain: Secondary | ICD-10-CM | POA: Insufficient documentation

## 2020-03-07 ENCOUNTER — Other Ambulatory Visit: Payer: Self-pay | Admitting: Internal Medicine

## 2020-03-07 DIAGNOSIS — Z1231 Encounter for screening mammogram for malignant neoplasm of breast: Secondary | ICD-10-CM

## 2020-03-10 ENCOUNTER — Encounter: Payer: Self-pay | Admitting: Student in an Organized Health Care Education/Training Program

## 2020-03-10 ENCOUNTER — Telehealth: Payer: Self-pay | Admitting: *Deleted
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
## 2020-03-11 ENCOUNTER — Emergency Department
Admission: EM | Admit: 2020-03-11 | Discharge: 2020-03-11 | Disposition: A | Discharge status: Home / Self Care | Payer: Medicare Other | Attending: Emergency Medicine | Admitting: Emergency Medicine

## 2020-03-11 ENCOUNTER — Other Ambulatory Visit: Payer: Self-pay

## 2020-03-11 ENCOUNTER — Encounter: Payer: Self-pay | Admitting: Emergency Medicine

## 2020-03-11 DIAGNOSIS — Z79899 Other long term (current) drug therapy: Secondary | ICD-10-CM | POA: Diagnosis not present

## 2020-03-11 DIAGNOSIS — G8929 Other chronic pain: Secondary | ICD-10-CM | POA: Diagnosis not present

## 2020-03-11 DIAGNOSIS — I25119 Atherosclerotic heart disease of native coronary artery with unspecified angina pectoris: Secondary | ICD-10-CM | POA: Diagnosis not present

## 2020-03-11 DIAGNOSIS — Z85828 Personal history of other malignant neoplasm of skin: Secondary | ICD-10-CM | POA: Diagnosis not present

## 2020-03-11 DIAGNOSIS — Z87891 Personal history of nicotine dependence: Secondary | ICD-10-CM | POA: Insufficient documentation

## 2020-03-11 DIAGNOSIS — I10 Essential (primary) hypertension: Secondary | ICD-10-CM | POA: Diagnosis not present

## 2020-03-11 DIAGNOSIS — M545 Low back pain: Secondary | ICD-10-CM | POA: Insufficient documentation

## 2020-03-11 MED ORDER — KETOROLAC TROMETHAMINE 30 MG/ML IJ SOLN
30.0000 mg | Freq: Once | INTRAMUSCULAR | Status: AC
  Administered 2020-03-11: 30 mg via INTRAMUSCULAR
  Filled 2020-03-11: qty 1

## 2020-03-11 NOTE — Discharge Instructions (Signed)
Call your pain clinic on Monday to see if there is a cancellation with Dr. Holley Raring this week.  Continue taking your cyclobenzaprine for muscle spasms as directed by your doctor.  Use heat or ice to your back as needed for discomfort.  An injection of ketorolac was given to you while in the ED.

## 2020-03-11 NOTE — ED Provider Notes (Signed)
[facility] Emergency Department Provider Note    ____________________________________________   First MD Initiated Contact with Patient 03/11/20 1146     (approximate)  I have reviewed the triage vital signs and the nursing notes.   HISTORY  Chief Complaint Back Pain   HPI Jacqueline Thornton is a 69 y.o. female presents to the ED with complaint of low back pain which is chronic.  Patient has a history of back pain and back surgery over a year ago.  Patient reportedly has exacerbation of her pain after pushing a cart that was loaded with heavy groceries.  Patient states that her pain home meds include oxycodone which she does not have at this time.  She denies any urinary symptoms or difficulty walking.  She states that she continues to take her muscle relaxant and other medications.  She rates her pain as 7 out of 10.      Past Medical History:  Diagnosis Date  . Absolute anemia 04/11/2015  . Acute postoperative pain 08/07/2017  . Angina pectoris ([place])   . Anxiety   . Arthritis   . 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 Atypical face pain 04/24/2015  . Back pain   . CAD (coronary artery disease)   . 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 Chronic back pain   . Depression   . Fibromyalgia   . GERD (gastroesophageal reflux disease)   . 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) 04/11/2015  . H/O: hysterectomy 1979  . Heart murmur   . Hyperlipidemia   . Hypertension   . Low back pain 04/06/2015  . Lumbar radicular pain 04/18/2015  . Migraine   . Narrowing of intervertebral disc space 04/11/2015  . Sacroiliac joint pain 04/11/2015  . Spine disorder     Patient Active Problem List   Diagnosis Date Noted  . Decreased glomerular filtration rate (GFR) 12/21/2019  . Elevated vitamin B12 level 12/21/2019  . 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 Pharmacologic therapy 03/22/2019  . Disorder of skeletal system 03/22/2019  . Problems  influencing health status 03/22/2019  . History of lumbar spinal fusion (Dr. Erline Levine) (12/11/2018) 03/22/2019  . Spondylolisthesis of lumbar region 12/11/2018  . DDD (degenerative disc disease), cervical 05/14/2018  . Trigger point with back pain (Right) 02/25/2018  . Fibromyalgia 02/25/2018  . 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 Left ovarian cyst 02/17/2018  . Bone island of right femur 12/11/2017  . Age-related osteoporosis without current pathological fracture 10/08/2017  . Spondylosis without myelopathy or radiculopathy, lumbosacral region 09/16/2017  . Spondylosis without myelopathy or radiculopathy, sacral and sacrococcygeal region 09/16/2017  . Other specified dorsopathies, sacral and sacrococcygeal region 09/16/2017  . Pseudophakia of left eye 02/24/2017  . Age-related nuclear cataract of left eye 12/24/2016  . Posterior subcapsular polar age-related cataract of left eye 12/24/2016  . Pseudophakia of right eye 12/23/2016  . Trochanteric bursitis of hip (Bilateral) (L>R) 12/05/2016  . DDD (degenerative disc disease), lumbar 12/04/2016  . Osteoarthritis of hip (Left) 01/23/2016  . Neurogenic pain 09/27/2015  . Myofascial pain 09/27/2015  . Trochanteric bursitis of right hip 08/23/2015  . Chronic hip pain (Bilateral) (L>R) 08/23/2015  . Chronic sacroiliac joint pain (Bilateral) (L>R) 07/06/2015  . Chronic lumbar radicular pain (Primary Area of Pain) (Left) (S1 Dermatome) 07/06/2015  . Long term prescription opiate use 07/06/2015  . Encounter for chronic pain management 07/06/2015  . Long term current use of opiate  analgesic 04/18/2015  . Encounter for therapeutic drug level monitoring 04/18/2015  . Uncomplicated opioid dependence ([place]) 04/18/2015  . Opiate use (56.25 MME/Day) 04/18/2015  . Myalgia 04/18/2015  . Anemia 04/11/2015  . Gout 04/11/2015  . Hyperlipidemia 04/11/2015  . Headache, migraine 04/11/2015  . Panic attack 04/11/2015  .  Chronic low back pain (Secondary area of Pain) (Bilateral) (midline to tailbone) (R>L) 04/11/2015  . Spondylosis of lumbar spine 04/11/2015  . Lumbar annular disc tear (L4-5) 04/11/2015  . Discogenic low back pain (L3-4 and L4-5) 04/11/2015  . Lumbar facet hypertrophy 04/11/2015  . Lumbar facet syndrome (Bilateral) (R>L) 04/11/2015  . Chronic neck pain 04/11/2015  . Cervical spondylosis 04/11/2015  . Hx of cervical spine surgery 04/11/2015  . Cervical spinal fusion (C6-7 interbody fusion) 04/11/2015    Class: History of  . Coronary atherosclerosis 04/11/2015  . CAD in native artery 04/11/2015  . Arthrodesis status 04/11/2015  . Chronic pain syndrome 04/06/2015  . Anxiety, generalized 01/15/2014    Past Surgical History:  Procedure Laterality Date  . ABDOMINAL EXPOSURE N/A 12/11/2018   Procedure: ABDOMINAL EXPOSURE;  Surgeon: Serafina Mitchell, MD;  Location: [facility] OR;  Service: Vascular;  Laterality: N/A;  . ABDOMINAL HYSTERECTOMY    . ANTERIOR LUMBAR FUSION N/A 12/11/2018   Procedure: Lumbar five Sacral one Anterior lumbar interbody fusion;  Surgeon: Erline Levine, MD;  Location: [place];  Service: Neurosurgery;  Laterality: N/A;  . APPENDECTOMY     Age19  . caract surger     02/24/17  . CHOLECYSTECTOMY    . COLONOSCOPY WITH PROPOFOL N/A 07/19/2015   Procedure: COLONOSCOPY WITH PROPOFOL;  Surgeon: Manya Silvas, MD;  Location[facility];  Service: Endoscopy;  Laterality: N/A;  . CYSTOSCOPY  03/03/2018   Procedure: CYSTOSCOPY;  Surgeon: Will Bonnet, MD;  Location: [facility] ORS;  Service: Gynecology;;  . LAPAROSCOPIC SALPINGO OOPHERECTOMY Bilateral 03/03/2018   Procedure: LAPAROSCOPIC SALPINGO OOPHORECTOMY;  Surgeon: Will Bonnet, MD;  Location: [facility] ORS;  Service: Gynecology;  Laterality: Bilateral;  . NECK SURGERY     Age 32 and 50.  Marland Kitchen SHOULDER SURGERY    . TONSILLECTOMY     Age 30  . TUMOR REMOVAL     Ovaries    Prior to Admission medications   Medication Sig Start  Date End Date Taking? Authorizing Provider  Cholecalciferol (VITAMIN D3) 50000 units CAPS Take 50,000 Units by mouth every Wednesday.  02/11/18   [provider]  Cyanocobalamin (VITAMIN B-12) 5000 MCG SUBL Place 1 tablet (5,000 mcg total) under the tongue daily. 06/21/19 06/20/20  Milinda Pointer, MD  cyclobenzaprine (FLEXERIL) 10 MG tablet Take 1 tablet (10 mg total) by mouth 3 (three) times daily as needed for muscle spasms. 12/18/19 06/15/20  Milinda Pointer, MD  DULoxetine (CYMBALTA) 60 MG capsule Take 60 mg by mouth at bedtime.  05/23/15   [provider]  enalapril (VASOTEC) 5 MG tablet Take 5 mg by mouth daily.    [provider]  esomeprazole (NEXIUM) 20 MG capsule Take 20 mg by mouth at bedtime.     [provider]  estradiol (ESTRACE) 0.5 MG tablet Take 0.5 mg by mouth at bedtime.  07/30/18   [provider]  gabapentin (NEURONTIN) 300 MG capsule Take 1 capsule (300 mg total) by mouth 2 (two) times daily. 12/18/19 06/15/20  Milinda Pointer, MD  Melatonin 3 MG TABS Take 3 mg by mouth at bedtime as needed (for sleep).     [provider]  oxyCODONE-acetaminophen (PERCOCET) 7.5-325 MG tablet Take 1 tablet by mouth 5 (five) times daily. Must last 30 days 01/17/20 02/16/20  Milinda Pointer, MD  oxyCODONE-acetaminophen (PERCOCET) 7.5-325 MG tablet Take 1 tablet by mouth 5 (five) times daily. Must last 30 days 02/16/20 03/17/20  Milinda Pointer, MD  rosuvastatin (CRESTOR) 5 MG tablet Take 5 mg by mouth at bedtime.     [provider]  sertraline (ZOLOFT) 50 MG tablet Take 50 mg by mouth daily.    [provider]    Allergies Patient has no known allergies.  Family History  Problem Relation Age of Onset  . Diabetes Father   . Hypertension Sister   . Diabetes Mother   . Stroke Mother   . Colon cancer Mother   . Colon cancer Maternal Grandmother   . Ovarian cancer Neg Hx   . Breast cancer Neg Hx     Social  History Social History   Tobacco Use  . Smoking status: Former Smoker    Quit date: 07/02/2003    Years since quitting: 16.7  . Smokeless tobacco: Never Used  Vaping Use  . Vaping Use: Never used  Substance Use Topics  . Alcohol use: No    Alcohol/week: 0.0 standard drinks  . Drug use: No    Review of Systems Constitutional: No fever/chills Cardiovascular: Denies chest pain. Respiratory: Denies shortness of breath. Gastrointestinal: No abdominal pain.  No nausea, no vomiting.  No diarrhea.   Genitourinary: Negative for dysuria. Musculoskeletal: Positive for chronic low back pain. Skin: Negative for rash. Neurological: Negative for headaches, focal weakness or numbness.  ____________________________________________   PHYSICAL EXAM:  VITAL SIGNS: ED Triage Vitals  Enc Vitals Group     BP 03/11/20 1134 (!) 144/69     Pulse Rate 03/11/20 1134 90     Resp 03/11/20 1134 18     Temp 03/11/20 1134 98.8 F (37.1 C)     Temp Source 03/11/20 1134 Oral     SpO2 03/11/20 1134 100 %     Weight 03/11/20 1132 150 lb (68 kg)     Height 03/11/20 1132 5' (1.524 m)     Head Circumference --      Peak Flow --      Pain Score 03/11/20 1132 7     Pain Loc --      Pain Edu? --      Excl. in [place]? --     Constitutional: Alert and oriented. Well appearing and in no acute distress.  Patient is lying on stretcher does not appear to be in any acute distress. Eyes: Conjunctivae are normal. PERRL. EOMI. Head: Atraumatic. Nose: No congestion/rhinnorhea. Mouth/Throat: Mucous membranes are moist.  Oropharynx non-erythematous. Neck: No stridor.   Cardiovascular: Normal rate, regular rhythm. Grossly normal heart sounds.  Good peripheral circulation. Respiratory: Normal respiratory effort.  No retractions. Lungs CTAB. Gastrointestinal: Soft and nontender. No distention. Musculoskeletal: Examination of the back there is no tenderness noted on palpation of the thoracic spine however on the lower  lumbar spine at approximately L5-S1 and sacral area there is tenderness bilaterally.  Range of motion is slightly guarded secondary to discomfort.  Good muscle strength bilaterally.  Straight leg raises were approximately 80 degrees bilaterally without any difficulty. Neurologic:  Normal speech and language. No gross focal neurologic deficits are appreciated. No gait instability. Skin:  Skin is warm, dry and intact. No rash noted. Psychiatric: Mood and affect are normal. Speech and behavior are normal.  ____________________________________________   LABS (  all labs ordered are listed, but only abnormal results are displayed)  Labs Reviewed - No data to display ____________________________________________  PROCEDURES  Procedure(s) performed (including Critical Care):  Procedures   ____________________________________________   INITIAL IMPRESSION / ASSESSMENT AND PLAN / ED COURSE  As part of my medical decision making, I reviewed the following data within the electronic MEDICAL RECORD NUMBER Notes from prior ED visits and [place] Controlled Substance Database  69 year old female presents to the ED with complaint of low back pain without symptoms of cauda equina.  Patient states that her back pain is exacerbated by her pushing a cart of heavy groceries.  She denies any urinary symptoms.  Patient has tenderness on palpation of the paravertebral muscles L5-S1 and sacral area.  Straight leg raises were negative.  In looking through patient's chart it appears that she is a patient at the pain clinic which was left out of her initial history.  It shows that patient called the office letting the nurse know that her medication disappeared from her purse.  Patient reports that she has an appointment with Dr. Holley Raring this coming week.  Patient was offered Toradol and instructions to continue taking her Flexeril that she has at home.  Patient agrees to comply and will also call Monday to see if there is been a  cancellation or if she can see the doctor sooner.   ____________________________________________   FINAL CLINICAL IMPRESSION(S) / ED DIAGNOSES  Final diagnoses:  Chronic bilateral low back pain without sciatica     ED Discharge Orders    None      *Please note:  LEANNAH GUSE was evaluated in Emergency Department on 03/11/2020 for the symptoms described in the history of present illness. She was evaluated in the context of the global COVID-19 pandemic, which necessitated consideration that the patient might be at risk for infection with the SARS-CoV-2 virus that causes COVID-19. Institutional protocols and algorithms that pertain to the evaluation of patients at risk for COVID-19 are in a state of rapid change based on information released by regulatory bodies including the CDC and federal and state organizations. These policies and algorithms were followed during the patient's care in the ED.  Some ED evaluations and interventions may be delayed as a result of limited staffing during and the pandemic.*   Note:  This document was prepared using Dragon voice recognition software and may include unintentional dictation errors.    Johnn Hai, PA-C 03/11/20 1446    Lucrezia Starch, MD 03/11/20 [phone]

## 2020-03-11 NOTE — ED Triage Notes (Addendum)
Pt arrived via POV with reports of low back pain L5, S1 per patient, pt has hx of chronic back issues. Pt reports hx of back surgery over 1 year ago.  Pt reports she recently was pushing a cart with a lot of heavy groceries that exacerbated the pain.  Pt takes pain meds at home oxycodone, but states she does not have any left right now. Pt ambulatory in triage without difficulty.

## 2020-03-14 ENCOUNTER — Ambulatory Visit
Payer: Medicare Other | Attending: Pain Medicine | Admitting: Student in an Organized Health Care Education/Training Program

## 2020-03-14 ENCOUNTER — Encounter: Payer: Self-pay | Admitting: Student in an Organized Health Care Education/Training Program

## 2020-03-14 DIAGNOSIS — M47817 Spondylosis without myelopathy or radiculopathy, lumbosacral region: Secondary | ICD-10-CM

## 2020-03-14 NOTE — Progress Notes (Signed)
I attempted to call the patient (multiple times today, on multiple phone numbers listed) however no response. Voicemail left instructing patient to call front desk office at [phone] to reschedule appointment. -Dr Holley Raring

## 2020-03-15 ENCOUNTER — Encounter: Payer: Medicare Other | Admitting: Pain Medicine

## 2020-03-21 ENCOUNTER — Encounter: Payer: Self-pay | Admitting: Student in an Organized Health Care Education/Training Program

## 2020-03-22 ENCOUNTER — Ambulatory Visit
Payer: Medicare Other | Attending: Student in an Organized Health Care Education/Training Program | Admitting: Student in an Organized Health Care Education/Training Program

## 2020-03-22 ENCOUNTER — Other Ambulatory Visit: Payer: Self-pay

## 2020-03-22 ENCOUNTER — Ambulatory Visit: Payer: Medicare Other

## 2020-03-22 ENCOUNTER — Encounter: Payer: Self-pay | Admitting: Student in an Organized Health Care Education/Training Program

## 2020-03-22 DIAGNOSIS — M961 Postlaminectomy syndrome, not elsewhere classified: Secondary | ICD-10-CM

## 2020-03-22 DIAGNOSIS — G894 Chronic pain syndrome: Secondary | ICD-10-CM | POA: Diagnosis not present

## 2020-03-22 DIAGNOSIS — G8929 Other chronic pain: Secondary | ICD-10-CM

## 2020-03-22 DIAGNOSIS — M47816 Spondylosis without myelopathy or radiculopathy, lumbar region: Secondary | ICD-10-CM

## 2020-03-22 DIAGNOSIS — M5136 Other intervertebral disc degeneration, lumbar region: Secondary | ICD-10-CM

## 2020-03-22 DIAGNOSIS — M7918 Myalgia, other site: Secondary | ICD-10-CM

## 2020-03-22 DIAGNOSIS — M47817 Spondylosis without myelopathy or radiculopathy, lumbosacral region: Secondary | ICD-10-CM

## 2020-03-22 DIAGNOSIS — M545 Low back pain: Secondary | ICD-10-CM

## 2020-03-22 MED ORDER — OXYCODONE-ACETAMINOPHEN 7.5-325 MG PO TABS: 1.0000 | ORAL_TABLET | Freq: Every day | ORAL | 0 refills | Status: DC

## 2020-03-22 MED ORDER — CYCLOBENZAPRINE HCL 10 MG PO TABS: 10.0000 mg | ORAL_TABLET | Freq: Three times a day (TID) | ORAL | 5 refills | Status: DC | PRN

## 2020-03-22 NOTE — Progress Notes (Signed)
Patient: Jacqueline Thornton  Service Category: E/M  Provider: Gillis Santa, MD  DOB: 02-Nov-1950  DOS: 03/22/2020  Location: Office  MRN: [id number]  Setting: Ambulatory outpatient  Referring Provider: Perrin Maltese, MD  Type: Established Patient  Specialty: Interventional Pain Management  PCP: Perrin Maltese, MD  Location: Home  Delivery: TeleHealth     Virtual Encounter - Pain Management PROVIDER NOTE: Information contained herein reflects review and annotations entered in association with encounter. Interpretation of such information and data should be left to medically-trained personnel. Information provided to patient can be located elsewhere in the medical record under "Patient Instructions". Document created using STT-dictation technology, any transcriptional errors that may result from process are unintentional.    Contact & Pharmacy Preferred: [phone] Home: [phone] (home) Mobile: [phone] (mobile) E-mail: [email or web address]  [place] (N), [place]) [place] [address] Phone: [phone] Fax: [phone]   Pre-screening  Ms. Desa offered "in-person" vs "virtual" encounter. She indicated preferring virtual for this encounter.   Reason COVID-19*  Social distancing based on CDC and AMA recommendations.   I contacted [name] on 03/22/2020 via video conference.      I clearly identified myself as Gillis Santa, MD. I verified that I was speaking with the correct person using two identifiers (Name: CHANYA CHRISLEY, and date of birth: [birth year]).  Consent I sought verbal advanced consent from [name] for virtual visit interactions. I informed Ms. Birt of possible security and privacy concerns, risks, and limitations associated with providing "not-in-person" medical evaluation and management services. I also informed Ms. Pribyl of the availability of "in-person"  appointments. Finally, I informed her that there would be a charge for the virtual visit and that she could be  personally, fully or partially, financially responsible for it. Ms. Scarfo expressed understanding and agreed to proceed.   Historic Elements   Jacqueline Thornton is a 69 y.o. year old, female patient evaluated today after our last contact on 03/14/2020. Ms. Skerritt  has a past medical history of Absolute anemia (04/11/2015), Acute postoperative pain (08/07/2017), Angina pectoris ([place]), Anxiety, Arthritis, Arthropathy of sacroiliac joint (04/11/2015), Atypical face pain (04/24/2015), Back pain, CAD (coronary artery disease), Cancer ([place]), Chronic back pain, Depression, Fibromyalgia, GERD (gastroesophageal reflux disease), 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) (04/11/2015), H/O: hysterectomy (1979), Heart murmur, Hyperlipidemia, Hypertension, Low back pain (04/06/2015), Lumbar radicular pain (04/18/2015), Migraine, Narrowing of intervertebral disc space (04/11/2015), Sacroiliac joint pain (04/11/2015), and Spine disorder. She also  has a past surgical history that includes Cholecystectomy; Abdominal hysterectomy; Neck surgery; Shoulder surgery; Colonoscopy with propofol (N/A, 07/19/2015); caract surger; Laparoscopic salpingo oophorectomy (Bilateral, 03/03/2018); Cystoscopy (03/03/2018); Tonsillectomy; Appendectomy; Tumor removal; Anterior lumbar fusion (N/A, 12/11/2018); and Abdominal exposure (N/A, 12/11/2018). Ms. Kroboth has a current medication list which includes the following prescription(s): vitamin d3, vitamin b-12, cyclobenzaprine, duloxetine, enalapril, esomeprazole, estradiol, gabapentin, melatonin, rosuvastatin, sertraline, oxycodone-acetaminophen, [START ON 04/21/2020] oxycodone-acetaminophen, and [START ON 05/21/2020] oxycodone-acetaminophen. She  reports that she quit smoking about 16 years ago. She has never used smokeless tobacco. She reports that she does not drink alcohol and does not use  drugs. Ms. Dicenso has No Known Allergies.   HPI  Today, she is being contacted for medication management.  Virtual visit today for medication management.  I informed the patient that I am covering for Dr. Dossie Arbour.  No significant change in her medical history since her last visit with Dr. Dossie Arbour on 02/01/2020 which  was a post procedure evaluation after right lumbar facet block.  Of note she did have an emergency department visit on 03/11/2020 for worsening low back pain.  She has been out of her Percocet for 3 days now.  She continues her Flexeril and Cymbalta as prescribed.  She is also on gabapentin as well and is not noticing any side effects from that.  She is in need of a refill of her Flexeril and Percocet today.  Pharmacotherapy Assessment  Analgesic: 02/16/2020  2   12/15/2019  Oxycodone-Acetaminophn 7.5-325  150.00  30 Fr Nav   [id number]   Wal (4231)   0/0  56.25 MME  Medicare   [place]    Monitoring: [place] PMP: PDMP reviewed during this encounter.       Pharmacotherapy: No side-effects or adverse reactions reported. Compliance: No problems identified. Effectiveness: Clinically acceptable. Plan: Refer to "POC".  UDS:  Summary  Date Value Ref Range Status  12/16/2019 Note  Final    Comment:    ==================================================================== [employer] [facility] [employer] (MW) ==================================================================== Test                             Result       Flag       Units  Drug Present and Declared for Prescription Verification   Oxycodone                      3206         EXPECTED   ng/mg creat   Oxymorphone                    1043         EXPECTED   ng/mg creat   Noroxycodone                   3386         EXPECTED   ng/mg creat   Noroxymorphone                 175          EXPECTED   ng/mg creat    Sources of oxycodone are scheduled prescription medications.    Oxymorphone, noroxycodone, and noroxymorphone are expected    metabolites of  oxycodone. Oxymorphone is also available as a    scheduled prescription medication.  ==================================================================== Test                      Result    Flag   Units      Ref Range   Creatinine              95               mg/dL      >=20 ==================================================================== Declared Medications:  The flagging and interpretation on this report are based on the  following declared medications.  Unexpected results may arise from  inaccuracies in the declared medications.   **Note: The testing scope of this panel includes these medications:   Oxycodone (Percocet)   **Note: The testing scope of this panel does not include the  following reported medications:   Acetaminophen (Percocet)  Cyclobenzaprine (Flexeril)  Duloxetine (Cymbalta)  Enalapril (Vasotec)  Esomeprazole (Nexium)  Estradiol (Estrace)  Gabapentin (Neurontin)  Hydrochlorothiazide (Hydrodiuril)  Melatonin  Rivaroxaban (Xarelto)  Rosuvastatin (Crestor)  Sertraline (Zoloft)  Vitamin B12  Vitamin D3 ==================================================================== For clinical consultation,  please call [phone]. ====================================================================     Laboratory Chemistry Profile   Renal Lab Results  Component Value Date   BUN 11 12/16/2019   CREATININE 1.35 (H) 12/16/2019   BCR 8 (L) 12/16/2019   GFRAA 47 (L) 12/16/2019   GFRNONAA 41 (L) 12/16/2019     Hepatic Lab Results  Component Value Date   AST 15 12/16/2019   ALT 14 03/08/2018   ALBUMIN 4.0 12/16/2019   ALKPHOS 60 12/16/2019     Electrolytes Lab Results  Component Value Date   NA 140 12/16/2019   K 4.5 12/16/2019   CL 100 12/16/2019   CALCIUM 8.8 12/16/2019   MG 2.4 (H) 12/16/2019     Bone Lab Results  Component Value Date   25OHVITD1 53 12/16/2019   25OHVITD2 <1.0 12/16/2019   25OHVITD3 53 12/16/2019     Inflammation  (CRP: Acute Phase) (ESR: Chronic Phase) Lab Results  Component Value Date   CRP 4 12/16/2019   ESRSEDRATE 6 12/16/2019       Note: Above Lab results reviewed.   Assessment  The primary encounter diagnosis was Spondylosis without myelopathy or radiculopathy, lumbosacral region. Diagnoses of Chronic pain syndrome, Myofascial pain, Lumbar facet syndrome (Bilateral) (R>L), Lumbar facet hypertrophy, DDD (degenerative disc disease), lumbar, Chronic low back pain (Secondary area of Pain) (Bilateral) (midline to tailbone) (R>L), and Failed back surgical syndrome were also pertinent to this visit.  Plan of Care  Ms. PHILIS DOKE has a current medication list which includes the following long-term medication(s): vitamin b-12, cyclobenzaprine, duloxetine, enalapril, esomeprazole, gabapentin, sertraline, oxycodone-acetaminophen, [START ON 04/21/2020] oxycodone-acetaminophen, and [START ON 05/21/2020] oxycodone-acetaminophen.  Pharmacotherapy (Medications Ordered): Meds ordered this encounter  Medications  . oxyCODONE-acetaminophen (PERCOCET) 7.5-325 MG tablet    Sig: Take 1 tablet by mouth 5 (five) times daily. Must last 30 days    Dispense:  150 tablet    Refill:  0    Chronic Pain: STOP Act (Not applicable) Fill 1 day early if closed on refill date.Avoid benzodiazepines within 8 hours of opioids  . oxyCODONE-acetaminophen (PERCOCET) 7.5-325 MG tablet    Sig: Take 1 tablet by mouth 5 (five) times daily. Must last 30 days    Dispense:  150 tablet    Refill:  0    Chronic Pain: STOP Act (Not applicable) Fill 1 day early if closed on refill date.Avoid benzodiazepines within 8 hours of opioids  . oxyCODONE-acetaminophen (PERCOCET) 7.5-325 MG tablet    Sig: Take 1 tablet by mouth 5 (five) times daily. Must last 30 days    Dispense:  150 tablet    Refill:  0    Chronic Pain: STOP Act (Not applicable) Fill 1 day early if closed on refill date.Avoid benzodiazepines within 8 hours of opioids  .  cyclobenzaprine (FLEXERIL) 10 MG tablet    Sig: Take 1 tablet (10 mg total) by mouth 3 (three) times daily as needed for muscle spasms.    Dispense:  90 tablet    Refill:  5    Fill one day early if pharmacy is closed on scheduled refill date. May substitute for generic if available.   Follow-up plan:   Return in about 3 months (around 06/21/2020) for Medication Management (Dr Delane Ginger).   Recent Visits Date Type Provider Dept  02/01/20 Telemedicine Milinda Pointer, MD [facility]  01/18/20 Procedure visit Milinda Pointer, MD [facility]  01/10/20 Office Visit Milinda Pointer, MD [facility]  Showing recent visits within past 90 days  and meeting all other requirements Today's Visits Date Type Provider Dept  03/22/20 Telemedicine Gillis Santa, MD [facility]  Showing today's visits and meeting all other requirements Future Appointments No visits were found meeting these conditions. Showing future appointments within next 90 days and meeting all other requirements  I discussed the assessment and treatment plan with the patient. The patient was provided an opportunity to ask questions and all were answered. The patient agreed with the plan and demonstrated an understanding of the instructions.  Patient advised to call back or seek an in-person evaluation if the symptoms or condition worsens.  Duration of encounter: 108mnutes.  Note by: BGillis Santa MD Date: 03/22/2020; Time: 3:50 PM

## 2020-03-31 ENCOUNTER — Other Ambulatory Visit: Payer: Self-pay | Admitting: Internal Medicine

## 2020-03-31 ENCOUNTER — Other Ambulatory Visit: Payer: Self-pay

## 2020-03-31 ENCOUNTER — Ambulatory Visit
Admission: RE | Admit: 2020-03-31 | Discharge: 2020-03-31 | Disposition: A | Discharge status: Home / Self Care | Payer: Medicare Other | Source: Ambulatory Visit | Attending: Internal Medicine | Admitting: Internal Medicine

## 2020-03-31 ENCOUNTER — Other Ambulatory Visit: Payer: Self-pay | Admitting: Surgery

## 2020-03-31 DIAGNOSIS — M79662 Pain in left lower leg: Secondary | ICD-10-CM

## 2020-03-31 DIAGNOSIS — M7989 Other specified soft tissue disorders: Secondary | ICD-10-CM | POA: Insufficient documentation

## 2020-04-24 ENCOUNTER — Encounter (INDEPENDENT_AMBULATORY_CARE_PROVIDER_SITE_OTHER): Payer: Medicare Other | Admitting: Vascular Surgery

## 2020-04-24 DIAGNOSIS — I872 Venous insufficiency (chronic) (peripheral): Secondary | ICD-10-CM | POA: Insufficient documentation

## 2020-04-24 DIAGNOSIS — I890 Lymphedema, not elsewhere classified: Secondary | ICD-10-CM | POA: Insufficient documentation

## 2020-04-24 NOTE — Progress Notes (Deleted)
MRN : [id number]  Jacqueline Thornton is a 69 y.o. (12-25-1950) female who presents with chief complaint of leg swelling.  History of Present Illness:   Patient is seen for evaluation of leg swelling. The patient first noticed the swelling remotely but is now concerned because of a significant increase in the overall edema. The swelling is associated with pain and discoloration. The patient notes that in the morning the legs are significantly improved but they steadily worsened throughout the course of the day. Elevation makes the legs better, dependency makes them much worse.   There is no history of ulcerations associated with the swelling.   The patient denies any recent changes in their medications.  The patient has not been wearing graduated compression.  The patient has no had any past angiography, interventions or vascular surgery.  The patient denies a history of DVT or PE. There is no prior history of phlebitis. There is no history of primary lymphedema.  There is no history of radiation treatment to the groin or pelvis No history of malignancies. No history of trauma or groin or pelvic surgery. No history of foreign travel or parasitic infections area    No outpatient medications have been marked as taking for the 04/24/20 encounter (Appointment) with Delana Meyer, Dolores Lory, MD.    Past Medical History:  Diagnosis Date  . Absolute anemia 04/11/2015  . Acute postoperative pain 08/07/2017  . Angina pectoris ([place])   . Anxiety   . Arthritis   . 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 Atypical face pain 04/24/2015  . Back pain   . CAD (coronary artery disease)   . 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 Chronic back pain   . Depression   . Fibromyalgia   . GERD (gastroesophageal reflux disease)   . 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) 04/11/2015  . H/O: hysterectomy 1979  . Heart murmur   . Hyperlipidemia   . Hypertension   . Low back pain 04/06/2015  .  Lumbar radicular pain 04/18/2015  . Migraine   . Narrowing of intervertebral disc space 04/11/2015  . Sacroiliac joint pain 04/11/2015  . Spine disorder     Past Surgical History:  Procedure Laterality Date  . ABDOMINAL EXPOSURE N/A 12/11/2018   Procedure: ABDOMINAL EXPOSURE;  Surgeon: Serafina Mitchell, MD;  Location: [facility] OR;  Service: Vascular;  Laterality: N/A;  . ABDOMINAL HYSTERECTOMY    . ANTERIOR LUMBAR FUSION N/A 12/11/2018   Procedure: Lumbar five Sacral one Anterior lumbar interbody fusion;  Surgeon: Erline Levine, MD;  Location: [facility];  Service: Neurosurgery;  Laterality: N/A;  . APPENDECTOMY     Age19  . caract surger     02/24/17  . CHOLECYSTECTOMY    . COLONOSCOPY WITH PROPOFOL N/A 07/19/2015   Procedure: COLONOSCOPY WITH PROPOFOL;  Surgeon: Manya Silvas, MD;  Location[facility] ENDOSCOPY;  Service: Endoscopy;  Laterality: N/A;  . CYSTOSCOPY  03/03/2018   Procedure: CYSTOSCOPY;  Surgeon: Will Bonnet, MD;  Location: [facility] ORS;  Service: Gynecology;;  . LAPAROSCOPIC SALPINGO OOPHERECTOMY Bilateral 03/03/2018   Procedure: LAPAROSCOPIC SALPINGO OOPHORECTOMY;  Surgeon: Will Bonnet, MD;  Location: [facility] ORS;  Service: Gynecology;  Laterality: Bilateral;  . NECK SURGERY     Age 34 and 3.  Marland Kitchen SHOULDER SURGERY    . TONSILLECTOMY     Age 22  . TUMOR REMOVAL     [place]    Social History Social History  Tobacco Use  . Smoking status: Former Smoker    Quit date: 07/02/2003    Years since quitting: 16.8  . Smokeless tobacco: Never Used  Vaping Use  . Vaping Use: Never used  Substance Use Topics  . Alcohol use: No    Alcohol/week: 0.0 standard drinks  . Drug use: No    Family History Family History  Problem Relation Age of Onset  . Diabetes Father   . Hypertension Sister   . Diabetes Mother   . Stroke Mother   . Colon cancer Mother   . Colon cancer Maternal Grandmother   . Ovarian cancer Neg Hx   . Breast cancer Neg Hx   No family history of  bleeding/clotting disorders, porphyria or autoimmune disease   No Known Allergies   REVIEW OF SYSTEMS (Negative unless checked)  Constitutional: [] Weight loss  [] Fever  [] Chills Cardiac: [] Chest pain   [] Chest pressure   [] Palpitations   [] Shortness of breath when laying flat   [] Shortness of breath with exertion. Vascular:  [] Pain in legs with walking   [x] Pain in legs at rest  [] History of DVT   [] Phlebitis   [x] Swelling in legs   [] Varicose veins   [] Non-healing ulcers Pulmonary:   [] Uses home oxygen   [] Productive cough   [] Hemoptysis   [] Wheeze  [] COPD   [] Asthma Neurologic:  [] Dizziness   [] Seizures   [] History of stroke   [] History of TIA  [] Aphasia   [] Vissual changes   [] Weakness or numbness in arm   [] Weakness or numbness in leg Musculoskeletal:   [] Joint swelling   [] Joint pain   [] Low back pain Hematologic:  [] Easy bruising  [] Easy bleeding   [] Hypercoagulable state   [] Anemic Gastrointestinal:  [] Diarrhea   [] Vomiting  [] Gastroesophageal reflux/heartburn   [] Difficulty swallowing. Genitourinary:  [] Chronic kidney disease   [] Difficult urination  [] Frequent urination   [] Blood in urine Skin:  [] Rashes   [] Ulcers  Psychological:  [] History of anxiety   []  History of major depression.  Physical Examination  There were no vitals filed for this visit. There is no height or weight on file to calculate BMI. Gen: WD/WN, NAD Head: [place]/AT, No temporalis wasting.  Ear/Nose/Throat: Hearing grossly intact, nares w/o erythema or drainage, poor dentition Eyes: PER, EOMI, sclera nonicteric.  Neck: Supple, no masses.  No bruit or JVD.  Pulmonary:  Good air movement, clear to auscultation bilaterally, no use of accessory muscles.  Cardiac: RRR, normal S1, S2, no Murmurs. Vascular: scattered varicosities present bilaterally.  Mild venous stasis changes to the legs bilaterally.  2+ soft pitting edema Vessel Right Left  Radial Palpable Palpable  PT Palpable Palpable  DP Palpable Palpable    Gastrointestinal: soft, non-distended. No guarding/no peritoneal signs.  Musculoskeletal: M/S 5/5 throughout.  No deformity or atrophy.  Neurologic: CN 2-12 intact. Pain and light touch intact in extremities.  Symmetrical.  Speech is fluent. Motor exam as listed above. Psychiatric: Judgment intact, Mood & affect appropriate for pt's clinical situation. Dermatologic: No rashes or ulcers noted.  No changes consistent with cellulitis. Lymph : No Cervical lymphadenopathy, no lichenification or skin changes of chronic lymphedema.  CBC Lab Results  Component Value Date   WBC 5.6 12/10/2018   HGB 11.7 (L) 12/10/2018   HCT 36.4 12/10/2018   MCV 100.8 (H) 12/10/2018   PLT 207 12/10/2018    BMET    Component Value Date/Time   NA 140 12/16/2019 1607   NA 145 01/26/2012 1452   K 4.5 12/16/2019 1607  K 3.5 01/26/2012 1452   CL 100 12/16/2019 1607   CL 111 (H) 01/26/2012 1452   CO2 27 12/10/2018 1432   CO2 26 01/26/2012 1452   GLUCOSE 81 12/16/2019 1607   GLUCOSE 110 (H) 12/10/2018 1432   GLUCOSE 92 01/26/2012 1452   BUN 11 12/16/2019 1607   BUN 5 (L) 01/26/2012 1452   CREATININE 1.35 (H) 12/16/2019 1607   CREATININE 0.86 01/26/2012 1452   CALCIUM 8.8 12/16/2019 1607   CALCIUM 8.6 01/26/2012 1452   GFRNONAA 41 (L) 12/16/2019 1607   GFRNONAA >60 01/26/2012 1452   GFRAA 47 (L) 12/16/2019 1607   GFRAA >60 01/26/2012 1452   CrCl cannot be calculated (Patient's most recent lab result is older than the maximum 21 days allowed.).  COAG Lab Results  Component Value Date   INR 1.0 03/02/2014    Radiology US Venous Img Lower Unilateral Left (DVT)  Result Date: 03/31/2020 CLINICAL DATA:  Left lower leg pain and swelling. EXAM: LEFT LOWER EXTREMITY VENOUS DOPPLER ULTRASOUND TECHNIQUE: Gray-scale sonography with compression, as well as color and duplex ultrasound, were performed to evaluate the deep venous system(s) from the level of the common femoral vein through the popliteal and  proximal calf veins. COMPARISON:  None. FINDINGS: VENOUS Normal compressibility of the common femoral, superficial femoral, and popliteal veins, as well as the visualized calf veins. Visualized portions of profunda femoral vein and great saphenous vein unremarkable. No filling defects to suggest DVT on grayscale or color Doppler imaging. Doppler waveforms show normal direction of venous flow, normal respiratory plasticity and response to augmentation. Limited views of the contralateral common femoral vein are unremarkable. OTHER None. Limitations: none IMPRESSION: Negative exam. Electronically Signed   By: Inge Rise M.D.   On: 03/31/2020 15:36     Assessment/Plan There are no diagnoses linked to this encounter.   Hortencia Pilar, MD  04/24/2020 8:43 AM

## 2020-05-23 NOTE — Progress Notes (Signed)
Attempted to call patient x2  To review tomorrow visit phone busy.

## 2020-05-24 ENCOUNTER — Encounter: Payer: Self-pay | Admitting: Pain Medicine

## 2020-05-24 ENCOUNTER — Ambulatory Visit: Payer: Medicare Other | Attending: Pain Medicine | Admitting: Pain Medicine

## 2020-05-24 ENCOUNTER — Other Ambulatory Visit: Payer: Self-pay

## 2020-05-24 DIAGNOSIS — M25552 Pain in left hip: Secondary | ICD-10-CM

## 2020-05-24 DIAGNOSIS — M545 Low back pain, unspecified: Secondary | ICD-10-CM

## 2020-05-24 DIAGNOSIS — M47816 Spondylosis without myelopathy or radiculopathy, lumbar region: Secondary | ICD-10-CM | POA: Diagnosis not present

## 2020-05-24 DIAGNOSIS — D519 Vitamin B12 deficiency anemia, unspecified: Secondary | ICD-10-CM

## 2020-05-24 DIAGNOSIS — G894 Chronic pain syndrome: Secondary | ICD-10-CM

## 2020-05-24 DIAGNOSIS — Z79899 Other long term (current) drug therapy: Secondary | ICD-10-CM

## 2020-05-24 DIAGNOSIS — M25551 Pain in right hip: Secondary | ICD-10-CM | POA: Diagnosis not present

## 2020-05-24 DIAGNOSIS — E538 Deficiency of other specified B group vitamins: Secondary | ICD-10-CM

## 2020-05-24 DIAGNOSIS — G8929 Other chronic pain: Secondary | ICD-10-CM

## 2020-05-24 DIAGNOSIS — M792 Neuralgia and neuritis, unspecified: Secondary | ICD-10-CM

## 2020-05-24 DIAGNOSIS — F1120 Opioid dependence, uncomplicated: Secondary | ICD-10-CM

## 2020-05-24 DIAGNOSIS — M961 Postlaminectomy syndrome, not elsewhere classified: Secondary | ICD-10-CM

## 2020-05-24 DIAGNOSIS — M7918 Myalgia, other site: Secondary | ICD-10-CM

## 2020-05-24 MED ORDER — OXYCODONE-ACETAMINOPHEN 7.5-325 MG PO TABS: 1.0000 | ORAL_TABLET | Freq: Every day | ORAL | 0 refills | Status: DC

## 2020-05-24 MED ORDER — GABAPENTIN 300 MG PO CAPS: 300.0000 mg | ORAL_CAPSULE | Freq: Two times a day (BID) | ORAL | 2 refills | Status: DC

## 2020-05-24 MED ORDER — CYCLOBENZAPRINE HCL 10 MG PO TABS: 10.0000 mg | ORAL_TABLET | Freq: Three times a day (TID) | ORAL | 2 refills | Status: DC | PRN

## 2020-05-24 MED ORDER — VITAMIN B-12 5000 MCG SL SUBL: 5000.0000 ug | SUBLINGUAL_TABLET | Freq: Every day | SUBLINGUAL | 2 refills | Status: DC

## 2020-05-24 NOTE — Progress Notes (Signed)
Patient: Jacqueline Thornton  Service Category: E/M  Provider: Gaspar Cola, MD  DOB: 27-Feb-1951  DOS: 05/24/2020  Location: Office  MRN: [id number]  Setting: Ambulatory outpatient  Referring Provider: Perrin Maltese, MD  Type: Established Patient  Specialty: Interventional Pain Management  PCP: Perrin Maltese, MD  Location: Remote location  Delivery: TeleHealth     Virtual Encounter - Pain Management PROVIDER NOTE: Information contained herein reflects review and annotations entered in association with encounter. Interpretation of such information and data should be left to medically-trained personnel. Information provided to patient can be located elsewhere in the medical record under "Patient Instructions". Document created using STT-dictation technology, any transcriptional errors that may result from process are unintentional.    Contact & Pharmacy Preferred: [phone] Home: [phone] (home) Mobile: [phone] (mobile) E-mail: [email or web address]  [facility] ([address]), [place] - [place] ([place]) [place] [address] Phone: [phone] Fax: [phone]   Pre-screening  Jacqueline Thornton offered "in-person" vs "virtual" encounter. She indicated preferring virtual for this encounter.   Reason COVID-19*  Social distancing based on CDC and AMA recommendations.   I contacted Jacqueline Thornton on 05/24/2020 via telephone.      I clearly identified myself as Gaspar Cola, MD. I verified that I was speaking with the correct person using two identifiers (Name: Jacqueline Thornton, and date of birth: April 27, 1951).  Consent I sought verbal advanced consent from Jacqueline Thornton for virtual visit interactions. I informed Jacqueline Thornton of possible security and privacy concerns, risks, and limitations associated with providing "not-in-person" medical evaluation and management services. I also informed Jacqueline Thornton of the  availability of "in-person" appointments. Finally, I informed her that there would be a charge for the virtual visit and that she could be  personally, fully or partially, financially responsible for it. Jacqueline Thornton expressed understanding and agreed to proceed.   Historic Elements   Jacqueline Thornton is a 69 y.o. year old, female patient evaluated today after our last contact on 01/18/2020. 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 She also  has a past surgical history that includes Cholecystectomy; Abdominal hysterectomy; Neck surgery; Shoulder surgery; Colonoscopy with propofol (N/A, 07/19/2015); caract surger; Laparoscopic salpingo oophorectomy (Bilateral, 03/03/2018); Cystoscopy (03/03/2018); Tonsillectomy; Appendectomy; Tumor removal; Anterior lumbar fusion (N/A, 12/11/2018); and Abdominal exposure (N/A, 12/11/2018). Jacqueline Thornton has a current medication list which includes the following prescription(s): nexletol, vitamin d3, duloxetine, enalapril, esomeprazole, estradiol, melatonin, sertraline, vitamin b-12, cyclobenzaprine, gabapentin, [START ON 06/20/2020] oxycodone-acetaminophen, [START ON 07/20/2020] oxycodone-acetaminophen, [START ON 08/19/2020] oxycodone-acetaminophen, and rosuvastatin. She  reports that she quit smoking about 16 years ago. She has never used smokeless tobacco. She  reports that she does not drink alcohol and does not use drugs. Jacqueline Thornton has No Known Allergies.   HPI  Today, she is being contacted for worsening of previously known (established) problem.  The patient indicates doing well with the current medication regimen. No adverse reactions or side effects reported to the medications. PMP & UDS compliant.  The  patient indicates having a flareup of her right-sided low back pain and left hip pain.  The patient was asked if there is felt that it was coming from the facet joints, which we have treated in the past and she indicated that yes it was.  In the case of the hip it seems to be in the area of the trochanteric bursa and today she has requested that we schedule her for a possible right-sided lumbar facet block and left hip injection.  RTCB: 09/18/2020  Nonopioids transferred 05/24/2020: Vitamin B12, Flexeril, and Neurontin.  Pharmacotherapy Assessment  Analgesic: Oxycodone/APAP 7.5/325, 1 tab PO q 5 times a day. (56.25 mg/day of oxycodone) MME/day:56.25 mg/day.   Monitoring: [place] PMP: PDMP reviewed during this encounter.       Pharmacotherapy: No side-effects or adverse reactions reported. Compliance: No problems identified. Effectiveness: Clinically acceptable. Plan: Refer to "POC".  UDS:  Summary  Date Value Ref Range Status  12/16/2019 Note  Final    Comment:    ==================================================================== [employer] (MW) ==================================================================== Test                             Result       Flag       Units  Drug Present and Declared for Prescription Verification   Oxycodone                      3206         EXPECTED   ng/mg creat   Oxymorphone                    1043         EXPECTED   ng/mg creat   Noroxycodone                   3386         EXPECTED   ng/mg creat   Noroxymorphone                 175          EXPECTED   ng/mg creat    Sources of oxycodone  are scheduled prescription medications.    Oxymorphone, noroxycodone, and noroxymorphone are expected    metabolites of oxycodone. Oxymorphone is also available as a    scheduled prescription medication.  ==================================================================== Test                      Result    Flag   Units      Ref Range   Creatinine              95               mg/dL      >=20 ==================================================================== Declared Medications:  The flagging and interpretation on this report are based on the  following declared medications.  Unexpected results may arise from  inaccuracies in the declared medications.   **Note: The testing scope of this panel includes these medications:   Oxycodone (Percocet)   **Note: The testing scope of this panel does not include the  following reported medications:   Acetaminophen (Percocet)  Cyclobenzaprine (Flexeril)  Duloxetine (Cymbalta)  Enalapril (Vasotec)  Esomeprazole (Nexium)  Estradiol (Estrace)  Gabapentin (Neurontin)  Hydrochlorothiazide (Hydrodiuril)  Melatonin  Rivaroxaban (Xarelto)  Rosuvastatin (Crestor)  Sertraline (Zoloft)  Vitamin B12  Vitamin D3 ==================================================================== For clinical consultation, please  call [phone]. ====================================================================     Laboratory Chemistry Profile   Renal Lab Results  Component Value Date   BUN 11 12/16/2019   CREATININE 1.35 (H) 12/16/2019   BCR 8 (L) 12/16/2019   GFRAA 47 (L) 12/16/2019   GFRNONAA 41 (L) 12/16/2019     Hepatic Lab Results  Component Value Date   AST 15 12/16/2019   ALT 14 03/08/2018   ALBUMIN 4.0 12/16/2019   ALKPHOS 60 12/16/2019     Electrolytes Lab Results  Component Value Date   NA 140 12/16/2019   K 4.5 12/16/2019   CL 100 12/16/2019   CALCIUM 8.8 12/16/2019   MG 2.4 (H) 12/16/2019     Bone Lab Results   Component Value Date   25OHVITD1 53 12/16/2019   25OHVITD2 <1.0 12/16/2019   25OHVITD3 53 12/16/2019     Inflammation (CRP: Acute Phase) (ESR: Chronic Phase) Lab Results  Component Value Date   CRP 4 12/16/2019   ESRSEDRATE 6 12/16/2019       Note: Above Lab results reviewed.  Imaging  US Venous Img Lower Unilateral Left (DVT) CLINICAL DATA:  Left lower leg pain and swelling.  EXAM: LEFT LOWER EXTREMITY VENOUS DOPPLER ULTRASOUND  TECHNIQUE: Gray-scale sonography with compression, as well as color and duplex ultrasound, were performed to evaluate the deep venous system(s) from the level of the common femoral vein through the popliteal and proximal calf veins.  COMPARISON:  None.  FINDINGS: VENOUS  Normal compressibility of the common femoral, superficial femoral, and popliteal veins, as well as the visualized calf veins. Visualized portions of profunda femoral vein and great saphenous vein unremarkable. No filling defects to suggest DVT on grayscale or color Doppler imaging. Doppler waveforms show normal direction of venous flow, normal respiratory plasticity and response to augmentation.  Limited views of the contralateral common femoral vein are unremarkable.  OTHER  None.  Limitations: none  IMPRESSION: Negative exam.  Electronically Signed   By: Inge Rise M.D.   On: 03/31/2020 15:36  Assessment  The primary encounter diagnosis was Chronic pain syndrome. Diagnoses of Chronic low back pain (Secondary area of Pain) (Bilateral) (midline to tailbone) (R>L), Lumbar facet syndrome (Bilateral) (R>L), Lumbar facet hypertrophy, Chronic hip pain (Bilateral) (L>R), Failed back surgical syndrome, Neurogenic pain, Myofascial pain, Pharmacologic therapy, Uncomplicated opioid dependence (Felt), Anemia due to vitamin B12 deficiency, unspecified B12 deficiency type, and Vitamin B12 deficiency were also pertinent to this visit.  Plan of Care  Problem-specific:   No problem-specific Assessment & Plan notes found for this encounter.  Jacqueline Thornton has a current medication list which includes the following long-term medication(s): duloxetine, enalapril, esomeprazole, sertraline, vitamin b-12, cyclobenzaprine, gabapentin, [START ON 06/20/2020] oxycodone-acetaminophen, [START ON 07/20/2020] oxycodone-acetaminophen, and [START ON 08/19/2020] oxycodone-acetaminophen.  Pharmacotherapy (Medications Ordered): Meds ordered this encounter  Medications  . Cyanocobalamin (VITAMIN B-12) 5000 MCG SUBL    Sig: Place 1 tablet (5,000 mcg total) under the tongue daily.    Dispense:  30 tablet    Refill:  2    Fill one day early if pharmacy is closed on scheduled refill date. Generic permitted. Do not send renewal requests. Void any older duplicate prescription or refill(s) that may be on file.  . cyclobenzaprine (FLEXERIL) 10 MG tablet    Sig: Take 1 tablet (10 mg total) by mouth 3 (three) times daily as needed for muscle spasms.    Dispense:  90 tablet    Refill:  2    Fill one day  early if pharmacy is closed on scheduled refill date. Generic permitted. Do not send renewal requests. Void any older duplicate prescription or refill(s) that may be on file.  . gabapentin (NEURONTIN) 300 MG capsule    Sig: Take 1 capsule (300 mg total) by mouth 2 (two) times daily.    Dispense:  60 capsule    Refill:  2    Fill one day early if pharmacy is closed on scheduled refill date. Generic permitted. Do not send renewal requests. Void any older duplicate prescription or refill(s) that may be on file.  Marland Kitchen oxyCODONE-acetaminophen (PERCOCET) 7.5-325 MG tablet    Sig: Take 1 tablet by mouth 5 (five) times daily. Must last 30 days    Dispense:  150 tablet    Refill:  0    Chronic Pain: STOP Act (Not applicable) Fill 1 day early if closed on refill date. Avoid benzodiazepines within 8 hours of opioids  . oxyCODONE-acetaminophen (PERCOCET) 7.5-325 MG tablet    Sig: Take 1 tablet  by mouth 5 (five) times daily. Must last 30 days    Dispense:  150 tablet    Refill:  0    Chronic Pain: STOP Act (Not applicable) Fill 1 day early if closed on refill date. Avoid benzodiazepines within 8 hours of opioids  . oxyCODONE-acetaminophen (PERCOCET) 7.5-325 MG tablet    Sig: Take 1 tablet by mouth 5 (five) times daily. Must last 30 days    Dispense:  150 tablet    Refill:  0    Chronic Pain: STOP Act (Not applicable) Fill 1 day early if closed on refill date. Avoid benzodiazepines within 8 hours of opioids   Orders:  Orders Placed This Encounter  Procedures  . LUMBAR FACET(MEDIAL BRANCH NERVE BLOCK) MBNB    Standing Status:   Future    Standing Expiration Date:   06/23/2020    Scheduling Instructions:     Procedure: Lumbar facet block (AKA.: Lumbosacral medial branch nerve block)     Side: Right-sided     Level: L3-4, L4-5, & L5-S1 Facets (L2, L3, L4, L5, & S1 Medial Branch Nerves)     Sedation: Patient's choice.     Timeframe: ASAA    Order Specific Question:   Where will this procedure be performed?    Answer:   [facility]  . HIP INJECTION    Standing Status:   Future    Standing Expiration Date:   08/24/2020    Scheduling Instructions:     Side: Left-sided     Sedation: With Sedation.     Timeframe: As soon as schedule allows   Follow-up plan:   Return for Procedure (w/ sedation): (R) L-FCT BLK + (L) Hip Inj..      Interventional management options:  Considering:   NOTE: XARELTO ANTICOAGULATION. (Stop: 3 days  Restart: 6 hrs) Diagnostic caudal epidural steroid injection + diagnostic epidurogram Diagnostic CESI Diagnostic bilateral cervical facet block Possible bilateral cervical facet RFA  Diagnostic bilateral lumbar facet block Possible bilateral lumbar facet RFA  Diagnostic left L4-5 LESI  Diagnostic left S1 SNRB Diagnostic bilateral SI joint block Possible bilateral SI joint RFA  Diagnostic right trochanteric bursa injection    Palliative PRN treatment(s):   Palliativebilateral lumbar facet block #3+bilateral sacroiliac joint block#6 PalliativeCESI  Palliativebilateral cervical facet block Palliativeleft L4-5 LESI  Palliativeleft caudal ESI  Palliativeleft S1 SNRB Palliativebilateral SI joint block Palliativeright trochanteric bursa injection      Recent Visits Date Type Provider Dept  03/22/20  Telemedicine Gillis Santa, MD [facility]  Showing recent visits within past 90 days and meeting all other requirements Today's Visits Date Type Provider Dept  05/24/20 Telemedicine Milinda Pointer, MD [facility]  Showing today's visits and meeting all other requirements Future Appointments No visits were found meeting these conditions. Showing future appointments within next 90 days and meeting all other requirements  I discussed the assessment and treatment plan with the patient. The patient was provided an opportunity to ask questions and all were answered. The patient agreed with the plan and demonstrated an understanding of the instructions.  Patient advised to call back or seek an in-person evaluation if the symptoms or condition worsens.  Duration of encounter: 15 minutes.  Note by: Gaspar Cola, MD Date: 05/24/2020; Time: 2:43 PM

## 2020-05-24 NOTE — Patient Instructions (Signed)
____________________________________________________________________________________________  Preparing for Procedure with Sedation  Procedure appointments are limited to planned procedures: . No Prescription Refills. . No disability issues will be discussed. . No medication changes will be discussed.  Instructions: . Oral Intake: Do not eat or drink anything for at least 8 hours prior to your procedure. (Exception: Blood Pressure Medication. See below.) . Transportation: Unless otherwise stated by your physician, you may drive yourself after the procedure. . Blood Pressure Medicine: Do not forget to take your blood pressure medicine with a sip of water the morning of the procedure. If your Diastolic (lower reading)is above 100 mmHg, elective cases will be cancelled/rescheduled. . Blood thinners: These will need to be stopped for procedures. Notify our staff if you are taking any blood thinners. Depending on which one you take, there will be specific instructions on how and when to stop it. . Diabetics on insulin: Notify the staff so that you can be scheduled 1st case in the morning. If your diabetes requires high dose insulin, take only  of your normal insulin dose the morning of the procedure and notify the staff that you have done so. . Preventing infections: Shower with an antibacterial soap the morning of your procedure. . Build-up your immune system: Take 1000 mg of Vitamin C with every meal (3 times a day) the day prior to your procedure. . Antibiotics: Inform the staff if you have a condition or reason that requires you to take antibiotics before dental procedures. . Pregnancy: If you are pregnant, call and cancel the procedure. . Sickness: If you have a cold, fever, or any active infections, call and cancel the procedure. . Arrival: You must be in the facility at least 30 minutes prior to your scheduled procedure. . Children: Do not bring children with you. . Dress appropriately:  Bring dark clothing that you would not mind if they get stained. . Valuables: Do not bring any jewelry or valuables.  Reasons to call and reschedule or cancel your procedure: (Following these recommendations will minimize the risk of a serious complication.) . Surgeries: Avoid having procedures within 2 weeks of any surgery. (Avoid for 2 weeks before or after any surgery). . Flu Shots: Avoid having procedures within 2 weeks of a flu shots or . (Avoid for 2 weeks before or after immunizations). . Barium: Avoid having a procedure within 7-10 days after having had a radiological study involving the use of radiological contrast. (Myelograms, Barium swallow or enema study). . Heart attacks: Avoid any elective procedures or surgeries for the initial 6 months after a "Myocardial Infarction" (Heart Attack). . Blood thinners: It is imperative that you stop these medications before procedures. Let us know if you if you take any blood thinner.  . Infection: Avoid procedures during or within two weeks of an infection (including chest colds or gastrointestinal problems). Symptoms associated with infections include: Localized redness, fever, chills, night sweats or profuse sweating, burning sensation when voiding, cough, congestion, stuffiness, runny nose, sore throat, diarrhea, nausea, vomiting, cold or Flu symptoms, recent or current infections. It is specially important if the infection is over the area that we intend to treat. . Heart and lung problems: Symptoms that may suggest an active cardiopulmonary problem include: cough, chest pain, breathing difficulties or shortness of breath, dizziness, ankle swelling, uncontrolled high or unusually low blood pressure, and/or palpitations. If you are experiencing any of these symptoms, cancel your procedure and contact your primary care physician for an evaluation.  Remember:  Regular Business hours are:    Monday to Thursday 8:00 AM to 4:00 PM  Provider's  Schedule: Marvina Danner, MD:  Procedure days: Tuesday and Thursday 7:30 AM to 4:00 PM  Bilal Lateef, MD:  Procedure days: Monday and Wednesday 7:30 AM to 4:00 PM ____________________________________________________________________________________________    

## 2020-06-08 ENCOUNTER — Other Ambulatory Visit: Payer: Self-pay

## 2020-06-08 ENCOUNTER — Ambulatory Visit (INDEPENDENT_AMBULATORY_CARE_PROVIDER_SITE_OTHER): Payer: Medicare Other | Admitting: Vascular Surgery

## 2020-06-08 ENCOUNTER — Encounter (INDEPENDENT_AMBULATORY_CARE_PROVIDER_SITE_OTHER): Payer: Self-pay | Admitting: Vascular Surgery

## 2020-06-08 VITALS — BP 142/74 | HR 74 | Resp 16 | Ht 60.0 in | Wt 150.0 lb

## 2020-06-08 DIAGNOSIS — I872 Venous insufficiency (chronic) (peripheral): Secondary | ICD-10-CM

## 2020-06-08 DIAGNOSIS — M5136 Other intervertebral disc degeneration, lumbar region: Secondary | ICD-10-CM

## 2020-06-08 DIAGNOSIS — I890 Lymphedema, not elsewhere classified: Secondary | ICD-10-CM | POA: Diagnosis not present

## 2020-06-08 DIAGNOSIS — E782 Mixed hyperlipidemia: Secondary | ICD-10-CM

## 2020-06-08 DIAGNOSIS — I25118 Atherosclerotic heart disease of native coronary artery with other forms of angina pectoris: Secondary | ICD-10-CM | POA: Diagnosis not present

## 2020-06-09 ENCOUNTER — Encounter (INDEPENDENT_AMBULATORY_CARE_PROVIDER_SITE_OTHER): Payer: Self-pay | Admitting: Vascular Surgery

## 2020-06-09 NOTE — Patient Instructions (Signed)
Your hypocritical and over the way

## 2020-06-09 NOTE — Progress Notes (Signed)
MRN : [id number]  LIAH MORR is a 69 y.o. (1951/03/21) female who presents with chief complaint of  Chief Complaint  Patient presents with  . New Patient (Initial Visit)    Ref Humphrey Rolls left le swelling  .  History of Present Illness:   Patient is seen for evaluation of leg pain and leg swelling. The patient first noticed the swelling remotely. The swelling is associated with pain and discoloration. The pain and swelling worsens with prolonged dependency and improves with elevation. The pain is unrelated to activity.  The patient notes that in the morning the legs are significantly improved but they steadily worsened throughout the course of the day. The patient also notes a steady worsening of the discoloration in the ankle and shin area.   The patient denies claudication symptoms.  The patient denies symptoms consistent with rest pain.  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  The patient has not had any past angiography, interventions or vascular surgery.  Elevation makes the leg symptoms better, dependency makes them much worse. There is no history of ulcerations. The patient denies any recent changes in medications.  The patient has been wearing graduated compression for several years which is clearly documented in Dr Laurelyn Sickle notes.  She has also been exercising routinely and using over the counter pain medication but to no avail.  The fact that her edema and her symptoms have worsened is what has prompted Dr Laurelyn Sickle referral.  The patient denies a history of DVT or PE. There is no prior history of phlebitis. There is no history of primary lymphedema.  No history of malignancies. No history of trauma or groin or pelvic surgery. There is no history of radiation treatment to the groin or pelvis  The patient denies amaurosis fugax or recent TIA symptoms. There are no recent neurological changes noted. The patient denies recent episodes of angina or  shortness of breath  Current Meds  Medication Sig  . Bempedoic Acid (NEXLETOL) 180 MG TABS Take 1 tablet by mouth daily.  . Cholecalciferol (VITAMIN D3) 50000 units CAPS Take 50,000 Units by mouth every Wednesday.   . Cyanocobalamin (VITAMIN B-12) 5000 MCG SUBL Place 1 tablet (5,000 mcg total) under the tongue daily.  . cyclobenzaprine (FLEXERIL) 10 MG tablet Take 1 tablet (10 mg total) by mouth 3 (three) times daily as needed for muscle spasms.  . DULoxetine (CYMBALTA) 60 MG capsule Take 60 mg by mouth at bedtime.   . enalapril (VASOTEC) 5 MG tablet Take 5 mg by mouth daily.  Marland Kitchen esomeprazole (NEXIUM) 20 MG capsule Take 20 mg by mouth at bedtime.   Marland Kitchen estradiol (ESTRACE) 0.5 MG tablet Take 0.5 mg by mouth at bedtime.   . Fe Fum-FA-B Cmp-C-Zn-Mg-Mn-Cu (HEMOCYTE-PLUS) 106-1 MG TABS Hemocyte-Plus 106-1 MG Oral Tablet QTY: 30 tablet Days: 30 Refills: 6  Written: 09/26/16 Patient Instructions: once a day  . furosemide (LASIX) 20 MG tablet Take 20-40 mg by mouth daily as needed.  . gabapentin (NEURONTIN) 300 MG capsule Take 1 capsule (300 mg total) by mouth 2 (two) times daily.  . Melatonin 3 MG TABS Take 3 mg by mouth at bedtime as needed (for sleep).   . meloxicam (MOBIC) 7.5 MG tablet Meloxicam 7.5 MG Oral Tablet QTY: 60 tablet Days: 30 Refills: 3  Written: 09/04/19 Patient Instructions: take 1 tablet by mouth up to twice daily as needed for pain.  . methocarbamol (ROBAXIN) 750 MG tablet Methocarbamol 750 MG  Oral Tablet QTY: 0 tablet Days: 0 Refills: 0  Written: 12/06/15 Patient Instructions: 1-3 prn qd  . [START ON 06/20/2020] oxyCODONE-acetaminophen (PERCOCET) 7.5-325 MG tablet Take 1 tablet by mouth 5 (five) times daily. Must last 30 days  . [START ON 07/20/2020] oxyCODONE-acetaminophen (PERCOCET) 7.5-325 MG tablet Take 1 tablet by mouth 5 (five) times daily. Must last 30 days  . [START ON 08/19/2020] oxyCODONE-acetaminophen (PERCOCET) 7.5-325 MG tablet Take 1 tablet by mouth 5 (five) times daily.  Must last 30 days  . sertraline (ZOLOFT) 50 MG tablet Take 50 mg by mouth daily.    Past Medical History:  Diagnosis Date  . Absolute anemia 04/11/2015  . Acute postoperative pain 08/07/2017  . Angina pectoris ([place])   . Anxiety   . Arthritis   . 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 Atypical face pain 04/24/2015  . Back pain   . CAD (coronary artery disease)   . 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 Chronic back pain   . Depression   . Fibromyalgia   . GERD (gastroesophageal reflux disease)   . 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) 04/11/2015  . H/O: hysterectomy 1979  . Heart murmur   . Hyperlipidemia   . Hypertension   . Low back pain 04/06/2015  . Lumbar radicular pain 04/18/2015  . Migraine   . Narrowing of intervertebral disc space 04/11/2015  . Sacroiliac joint pain 04/11/2015  . Spine disorder     Past Surgical History:  Procedure Laterality Date  . ABDOMINAL EXPOSURE N/A 12/11/2018   Procedure: ABDOMINAL EXPOSURE;  Surgeon: Serafina Mitchell, MD;  Location: [facility] OR;  Service: Vascular;  Laterality: N/A;  . ABDOMINAL HYSTERECTOMY    . ANTERIOR LUMBAR FUSION N/A 12/11/2018   Procedure: Lumbar five Sacral one Anterior lumbar interbody fusion;  Surgeon: Erline Levine, MD;  Location: [facility];  Service: Neurosurgery;  Laterality: N/A;  . APPENDECTOMY     Age19  . caract surger     02/24/17  . CHOLECYSTECTOMY    . COLONOSCOPY WITH PROPOFOL N/A 07/19/2015   Procedure: COLONOSCOPY WITH PROPOFOL;  Surgeon: Manya Silvas, MD;  Location: [facility] ENDOSCOPY;  Service: Endoscopy;  Laterality: N/A;  . CYSTOSCOPY  03/03/2018   Procedure: CYSTOSCOPY;  Surgeon: Will Bonnet, MD;  Location: [facility] ORS;  Service: Gynecology;;  . LAPAROSCOPIC SALPINGO OOPHERECTOMY Bilateral 03/03/2018   Procedure: LAPAROSCOPIC SALPINGO OOPHORECTOMY;  Surgeon: Will Bonnet, MD;  Location: [facility] ORS;  Service: Gynecology;  Laterality: Bilateral;  . NECK SURGERY     Age 37 and 79.  Marland Kitchen  SHOULDER SURGERY    . TONSILLECTOMY     Age 8  . TUMOR REMOVAL     [place]    Social History Social History   Tobacco Use  . Smoking status: Former Smoker    Quit date: 07/02/2003    Years since quitting: 16.9  . Smokeless tobacco: Never Used  Vaping Use  . Vaping Use: Never used  Substance Use Topics  . Alcohol use: No    Alcohol/week: 0.0 standard drinks  . Drug use: No    Family History Family History  Problem Relation Age of Onset  . Diabetes Father   . Hypertension Sister   . Diabetes Mother   . Stroke Mother   . Colon cancer Mother   . Colon cancer Maternal Grandmother   . Ovarian cancer Neg Hx   . Breast cancer Neg Hx   No family history of bleeding/clotting  disorders, porphyria or autoimmune disease   No Known Allergies   REVIEW OF SYSTEMS (Negative unless checked)  Constitutional: [] Weight loss  [] Fever  [] Chills Cardiac: [] Chest pain   [] Chest pressure   [] Palpitations   [] Shortness of breath when laying flat   [] Shortness of breath with exertion. Vascular:  [] Pain in legs with walking   [x] Pain in legs at rest  [] History of DVT   [] Phlebitis   [x] Swelling in legs   [] Varicose veins   [] Non-healing ulcers Pulmonary:   [] Uses home oxygen   [] Productive cough   [] Hemoptysis   [] Wheeze  [] COPD   [] Asthma Neurologic:  [] Dizziness   [] Seizures   [] History of stroke   [] History of TIA  [] Aphasia   [] Vissual changes   [] Weakness or numbness in arm   [] Weakness or numbness in leg Musculoskeletal:   [] Joint swelling   [x] Joint pain   [x] Low back pain Hematologic:  [] Easy bruising  [] Easy bleeding   [] Hypercoagulable state   [] Anemic Gastrointestinal:  [] Diarrhea   [] Vomiting  [] Gastroesophageal reflux/heartburn   [] Difficulty swallowing. Genitourinary:  [] Chronic kidney disease   [] Difficult urination  [] Frequent urination   [] Blood in urine Skin:  [] Rashes   [] Ulcers  Psychological:  [] History of anxiety   []  History of major depression.  Physical  Examination  Vitals:   06/08/20 1439  BP: (!) 142/74  Pulse: 74  Resp: 16  Weight: 150 lb (68 kg)  Height: 5' (1.524 m)   Body mass index is 29.29 kg/m. Gen: WD/WN, NAD Head: [place]/AT, No temporalis wasting.  Ear/Nose/Throat: Hearing grossly intact, nares w/o erythema or drainage, poor dentition Eyes: PER, EOMI, sclera nonicteric.  Neck: Supple, no masses.  No bruit or JVD.  Pulmonary:  Good air movement, clear to auscultation bilaterally, no use of accessory muscles.  Cardiac: RRR, normal S1, S2, no Murmurs. Vascular: scattered varicosities present bilaterally.  Moderate venous stasis changes to the legs bilaterally.  3+ firm pitting edema Vessel Right Left  Radial Palpable Palpable  PT Palpable Palpable  DP Palpable Palpable  Gastrointestinal: soft, non-distended. No guarding/no peritoneal signs.  Musculoskeletal: M/S 5/5 throughout.  No deformity or atrophy.  Neurologic: CN 2-12 intact. Pain and light touch intact in extremities.  Symmetrical.  Speech is fluent. Motor exam as listed above. Psychiatric: Judgment intact, Mood & affect appropriate for pt's clinical situation. Dermatologic: Moderate venous rashes no ulcers noted.  No changes consistent with cellulitis. Lymph : + lichenification and skin changes of chronic lymphedema.  CBC Lab Results  Component Value Date   WBC 5.6 12/10/2018   HGB 11.7 (L) 12/10/2018   HCT 36.4 12/10/2018   MCV 100.8 (H) 12/10/2018   PLT 207 12/10/2018    BMET    Component Value Date/Time   NA 140 12/16/2019 1607   NA 145 01/26/2012 1452   K 4.5 12/16/2019 1607   K 3.5 01/26/2012 1452   CL 100 12/16/2019 1607   CL 111 (H) 01/26/2012 1452   CO2 27 12/10/2018 1432   CO2 26 01/26/2012 1452   GLUCOSE 81 12/16/2019 1607   GLUCOSE 110 (H) 12/10/2018 1432   GLUCOSE 92 01/26/2012 1452   BUN 11 12/16/2019 1607   BUN 5 (L) 01/26/2012 1452   CREATININE 1.35 (H) 12/16/2019 1607   CREATININE 0.86 01/26/2012 1452   CALCIUM 8.8 12/16/2019  1607   CALCIUM 8.6 01/26/2012 1452   GFRNONAA 41 (L) 12/16/2019 1607   GFRNONAA >60 01/26/2012 1452   GFRAA 47 (L) 12/16/2019 1607   GFRAA >  60 01/26/2012 1452   CrCl cannot be calculated (Patient's most recent lab result is older than the maximum 21 days allowed.).  COAG Lab Results  Component Value Date   INR 1.0 03/02/2014    Radiology No results found.  Outside Studies/Documentation 18 pages of outside documents were reviewed.  They showed The patient has been wearing graduated compression for several years which is clearly documented in Dr Laurelyn Sickle notes.  She has also been exercising routinely and using over the counter pain medication but to no avail.  The fact that her edema and her symptoms have worsened is what has prompted Dr Laurelyn Sickle referral.  Assessment/Plan 1. Lymphedema Recommend:  No surgery or intervention at this point in time.    I have reviewed my previous discussion with the patient regarding swelling and why it causes symptoms.  Patient will continue wearing graduated compression stockings class 1 (20-30 mmHg) on a daily basis. The patient will  beginning wearing the stockings first thing in the morning and removing them in the evening. The patient is instructed specifically not to sleep in the stockings.    In addition, behavioral modification including several periods of elevation of the lower extremities during the day will be continued.  This was reviewed with the patient during the initial visit.  The patient will also continue routine exercise, especially walking on a daily basis as was discussed during the initial visit.    Despite conservative treatments including graduated compression therapy class 1 and behavioral modification including exercise and elevation the patient  has not obtained adequate control of the lymphedema.  The patient still has stage 3 lymphedema and therefore, I believe that a lymph pump should be added to improve the control of the  patient's lymphedema.  Additionally, a lymph pump is warranted because it will reduce the risk of cellulitis and ulceration in the future.  Patient should follow-up in six months    2. Chronic venous insufficiency Recommend:  No surgery or intervention at this point in time.    I have reviewed my previous discussion with the patient regarding swelling and why it causes symptoms.  Patient will continue wearing graduated compression stockings class 1 (20-30 mmHg) on a daily basis. The patient will  beginning wearing the stockings first thing in the morning and removing them in the evening. The patient is instructed specifically not to sleep in the stockings.    In addition, behavioral modification including several periods of elevation of the lower extremities during the day will be continued.  This was reviewed with the patient during the initial visit.  The patient will also continue routine exercise, especially walking on a daily basis as was discussed during the initial visit.    Despite conservative treatments including graduated compression therapy class 1 and behavioral modification including exercise and elevation the patient  has not obtained adequate control of the lymphedema.  The patient still has stage 3 lymphedema and therefore, I believe that a lymph pump should be added to improve the control of the patient's lymphedema.  Additionally, a lymph pump is warranted because it will reduce the risk of cellulitis and ulceration in the future.  Patient should follow-up in six months    3. Atherosclerosis of native coronary artery of native heart with stable angina pectoris (HCC) Continue cardiac and antihypertensive medications as already ordered and reviewed, no changes at this time.  Continue statin as ordered and reviewed, no changes at this time  Nitrates PRN for chest pain  4. Mixed hyperlipidemia Continue statin as ordered and reviewed, no changes at this time   5. DDD  (degenerative disc disease), lumbar Continue NSAID medications as already ordered, these medications have been reviewed and there are no changes at this time.  Continued activity and therapy was stressed.     Hortencia Pilar, MD  06/09/2020 9:33 AM

## 2020-06-11 NOTE — Progress Notes (Addendum)
PROVIDER NOTE: Information contained herein reflects review and annotations entered in association with encounter. Interpretation of such information and data should be left to medically-trained personnel. Information provided to patient can be located elsewhere in the medical record under "Patient Instructions". Document created using STT-dictation technology, any transcriptional errors that may result from process are unintentional.    Patient: Jacqueline Thornton  Service Category: Procedure  Provider: Gaspar Cola, MD  DOB: Mar 18, 1951  DOS: 06/13/2020  Location: [facility]  MRN: [id number]  Setting: Ambulatory - outpatient  Referring Provider: Perrin Maltese, MD  Type: Established Patient  Specialty: Interventional Pain Management  PCP: Perrin Maltese, MD   Primary Reason for Visit: Interventional Pain Management Treatment. CC: Hip Pain (Left hip, right lower back)  Procedure #1:  Anesthesia, Analgesia, Anxiolysis:  Type: Lumbar Facet, Medial Branch Block(s)          Primary Purpose: Palliative Region: Posterolateral Lumbosacral Spine Level: L2, L3, L4, L5, & S1 Medial Branch Level(s). Injecting these levels blocks the L3-4, L4-5, and L5-S1 lumbar facet joints. Laterality: Right  Type: Moderate (Conscious) Sedation combined with Local Anesthesia Indication(s): Analgesia and Anxiety Route: Intravenous (IV) IV Access: Secured Sedation: Meaningful verbal contact was maintained at all times during the procedure  Local Anesthetic: Lidocaine 1-2%  Position: Prone   Indications: 1. Lumbar facet syndrome (Bilateral) (R>L)   2. Spondylosis without myelopathy or radiculopathy, lumbosacral region   3. Lumbar facet hypertrophy   4. DDD (degenerative disc disease), lumbar   5. 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 Spondylolisthesis of lumbar region    Procedure #2:    Type: Intra-Articular Hip Injection #1  Primary Purpose: Diagnostic Region:  Posterolateral hip joint area. Level: Lower pelvic and hip joint level. Target Area: Superior aspect of the hip joint cavity, going thru the superior portion of the capsular ligament. Approach: Posterolateral approach. Laterality: Left  Position: Prone Prepped Area: Entire Posterolateral hip area. DuraPrep (Iodine Povacrylex [0.7% available iodine] and Isopropyl Alcohol, 74% w/w)   Indications: 1. Chronic hip pain (Bilateral) (L>R)   2. Osteoarthritis of hip (Left)   3. 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  She was told that they want to try some injections first.  Currently she is having left-sided cervicalgia, left shoulder pain, left occipital headaches (cervicogenic headaches), no upper extremity pain, numbness, or weakness.  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  She does have an MRI of the cervical spine but it is from 2013.  Pre-op H&P Assessment:  Jacqueline Thornton is a 69 y.o. (year old), female patient, seen today for interventional treatment. She  has a past surgical history that includes Cholecystectomy; Abdominal hysterectomy; Neck surgery; Shoulder surgery; Colonoscopy with propofol (N/A, 07/19/2015); caract surger; Laparoscopic salpingo oophorectomy (Bilateral, 03/03/2018); Cystoscopy (03/03/2018); Tonsillectomy; Appendectomy; Tumor removal; Anterior lumbar fusion (N/A, 12/11/2018); and Abdominal exposure (N/A, 12/11/2018). Jacqueline Thornton has a current medication list which includes the following prescription(s): nexletol, vitamin d3, vitamin b-12, cyclobenzaprine, duloxetine, enalapril, esomeprazole, estradiol, hemocyte-plus, furosemide, gabapentin, melatonin, meloxicam, methocarbamol,  [START ON 06/20/2020] oxycodone-acetaminophen, [START ON 07/20/2020] oxycodone-acetaminophen, [START ON 08/19/2020] oxycodone-acetaminophen, rosuvastatin, and sertraline, and the following Facility-Administered Medications: fentanyl, iohexol, and midazolam. Her primarily concern today  is the Hip Pain (Left hip, right lower back)  Initial Vital Signs:  Pulse/HCG Rate: 80ECG Heart Rate: 69 Temp: (!) 97.3 F (36.3 C) Resp: 18 BP: 118/71 SpO2: 98 %  BMI: Estimated body mass index is 29.29 kg/m as calculated from the following:   Height as of this encounter: 5' (1.524 m).   Weight as of this encounter: 150 lb (68 kg).  Risk Assessment: Allergies: Reviewed. She has No Known Allergies.  Allergy Precautions: None required Coagulopathies: Reviewed. None identified.  Blood-thinner therapy: None at this time Active Infection(s): Reviewed. None identified. Ms. Tessler is afebrile  Site Confirmation: Jacqueline Thornton was asked to confirm the procedure and laterality before marking the site Procedure checklist: Completed Consent: Before the procedure and under the influence of no sedative(s), amnesic(s), or anxiolytics, the patient was informed of the treatment options, risks and possible complications. To fulfill our ethical and legal obligations, as recommended by the American Medical Association's Code of Ethics, I have informed the patient of my clinical impression; the nature and purpose of the treatment or procedure; the risks, benefits, and possible complications of the intervention; the alternatives, including doing nothing; the risk(s) and benefit(s) of the alternative treatment(s) or procedure(s); and the risk(s) and benefit(s) of doing nothing. The patient was provided information about the general risks and possible complications associated with the procedure. These may include, but are not limited to: failure to achieve desired goals, infection, bleeding, organ or nerve damage, allergic reactions,  paralysis, and death. In addition, the patient was informed of those risks and complications associated to Spine-related procedures, such as failure to decrease pain; infection (i.e.: Meningitis, epidural or intraspinal abscess); bleeding (i.e.: epidural hematoma, subarachnoid hemorrhage, or any other type of intraspinal or peri-dural bleeding); organ or nerve damage (i.e.: Any type of peripheral nerve, nerve root, or spinal cord injury) with subsequent damage to sensory, motor, and/or autonomic systems, resulting in permanent pain, numbness, and/or weakness of one or several areas of the body; allergic reactions; (i.e.: anaphylactic reaction); and/or death. Furthermore, the patient was informed of those risks and complications associated with the medications. These include, but are not limited to: allergic reactions (i.e.: anaphylactic or anaphylactoid reaction(s)); adrenal axis suppression; blood sugar elevation that in diabetics may result in ketoacidosis or comma; water retention that in patients with history of congestive heart failure may result in shortness of breath, pulmonary edema, and decompensation with resultant heart failure; weight gain; swelling or edema; medication-induced neural toxicity; particulate matter embolism and blood vessel occlusion with resultant organ, and/or nervous system infarction; and/or aseptic necrosis of one or more joints. Finally, the patient was informed that Medicine is not an exact science; therefore, there is also the possibility of unforeseen or unpredictable risks and/or possible complications that may result in a catastrophic outcome. The patient indicated having understood very clearly. We have given the patient no guarantees and we have made no promises. Enough time was given to the patient to ask questions, all of which were answered to the patient's satisfaction. Ms. Riebe has indicated that she wanted to continue with the procedure. Attestation: I, the  ordering provider, attest that I have discussed with the patient the benefits, risks, side-effects, alternatives, likelihood of achieving goals, and potential problems during recovery for the procedure that I have provided informed consent. Date  Time: 06/13/2020  8:39 AM  Pre-Procedure Preparation:  Monitoring: As per clinic protocol. Respiration, ETCO2, SpO2, BP, heart rate and rhythm monitor placed and checked for adequate function Safety Precautions: Patient  was assessed for positional comfort and pressure points before starting the procedure. Time-out: I initiated and conducted the "Time-out" before starting the procedure, as per protocol. The patient was asked to participate by confirming the accuracy of the "Time Out" information. Verification of the correct person, site, and procedure were performed and confirmed by me, the nursing staff, and the patient. "Time-out" conducted as per Joint Commission's Universal Protocol (UP.01.01.01). Time: 0904  Description of Procedure #1:  Laterality: Right Levels:  L2, L3, L4, L5, & S1 Medial Branch Level(s) Area Prepped: Posterior Lumbosacral Region DuraPrep (Iodine Povacrylex [0.7% available iodine] and Isopropyl Alcohol, 74% w/w) Safety Precautions: Aspiration looking for blood return was conducted prior to all injections. At no point did we inject any substances, as a needle was being advanced. Before injecting, the patient was told to immediately notify me if she was experiencing any new onset of "ringing in the ears, or metallic taste in the mouth". No attempts were made at seeking any paresthesias. Safe injection practices and needle disposal techniques used. Medications properly checked for expiration dates. SDV (single dose vial) medications used. After the completion of the procedure, all disposable equipment used was discarded in the proper designated medical waste containers. Local Anesthesia: Protocol guidelines were followed. The patient was  positioned over the fluoroscopy table. The area was prepped in the usual manner. The time-out was completed. The target area was identified using fluoroscopy. A 12-in long, straight, sterile hemostat was used with fluoroscopic guidance to locate the targets for each level blocked. Once located, the skin was marked with an approved surgical skin marker. Once all sites were marked, the skin (epidermis, dermis, and hypodermis), as well as deeper tissues (fat, connective tissue and muscle) were infiltrated with a small amount of a short-acting local anesthetic, loaded on a 10cc syringe with a 25G, 1.5-in  Needle. An appropriate amount of time was allowed for local anesthetics to take effect before proceeding to the next step. Local Anesthetic: Lidocaine 2.0% The unused portion of the local anesthetic was discarded in the proper designated containers. Technical explanation of process:  L2 Medial Branch Nerve Block (MBB): The target area for the L2 medial branch is at the junction of the postero-lateral aspect of the superior articular process and the superior, posterior, and medial edge of the transverse process of L3. Under fluoroscopic guidance, a Quincke needle was inserted until contact was made with os over the superior postero-lateral aspect of the pedicular shadow (target area). After negative aspiration for blood, 0.5 mL of the nerve block solution was injected without difficulty or complication. The needle was removed intact. L3 Medial Branch Nerve Block (MBB): The target area for the L3 medial branch is at the junction of the postero-lateral aspect of the superior articular process and the superior, posterior, and medial edge of the transverse process of L4. Under fluoroscopic guidance, a Quincke needle was inserted until contact was made with os over the superior postero-lateral aspect of the pedicular shadow (target area). After negative aspiration for blood, 0.5 mL of the nerve block solution was  injected without difficulty or complication. The needle was removed intact. L4 Medial Branch Nerve Block (MBB): The target area for the L4 medial branch is at the junction of the postero-lateral aspect of the superior articular process and the superior, posterior, and medial edge of the transverse process of L5. Under fluoroscopic guidance, a Quincke needle was inserted until contact was made with os over the superior postero-lateral aspect of the pedicular shadow (target  area). After negative aspiration for blood, 0.5 mL of the nerve block solution was injected without difficulty or complication. The needle was removed intact. L5 Medial Branch Nerve Block (MBB): The target area for the L5 medial branch is at the junction of the postero-lateral aspect of the superior articular process and the superior, posterior, and medial edge of the sacral ala. Under fluoroscopic guidance, a Quincke needle was inserted until contact was made with os over the superior postero-lateral aspect of the pedicular shadow (target area). After negative aspiration for blood, 0.5 mL of the nerve block solution was injected without difficulty or complication. The needle was removed intact. S1 Medial Branch Nerve Block (MBB): The target area for the S1 medial branch is at the posterior and inferior 6 o'clock position of the L5-S1 facet joint. Under fluoroscopic guidance, the Quincke needle inserted for the L5 MBB was redirected until contact was made with os over the inferior and postero aspect of the sacrum, at the 6 o' clock position under the L5-S1 facet joint (Target area). After negative aspiration for blood, 0.5 mL of the nerve block solution was injected without difficulty or complication. The needle was removed intact.  Nerve block solution: 0.2% PF-Ropivacaine + Triamcinolone (40 mg/mL) diluted to a final concentration of 4 mg of Triamcinolone/mL of Ropivacaine The unused portion of the solution was discarded in the proper  designated containers. Procedural Needles: 22-gauge, 3.5-inch, Quincke needles used for all levels.  Once the entire procedure was completed, the treated area was cleaned, making sure to leave some of the prepping solution back to take advantage of its long term bactericidal properties.   Illustration of the posterior view of the lumbar spine and the posterior neural structures. Laminae of L2 through S1 are labeled. DPRL5, dorsal primary ramus of L5; DPRS1, dorsal primary ramus of S1; DPR3, dorsal primary ramus of L3; FJ, facet (zygapophyseal) joint L3-L4; I, inferior articular process of L4; LB1, lateral branch of dorsal primary ramus of L1; IAB, inferior articular branches from L3 medial branch (supplies L4-L5 facet joint); IBP, intermediate branch plexus; MB3, medial branch of dorsal primary ramus of L3; NR3, third lumbar nerve root; S, superior articular process of L5; SAB, superior articular branches from L4 (supplies L4-5 facet joint also); TP3, transverse process of L3.  Vitals:   06/13/20 0920 06/13/20 0930 06/13/20 0940 06/13/20 0949  BP: 112/69 123/64 130/61 123/79  Pulse: 72 77    Resp: 13 16 18 16   Temp:  97.8 F (36.6 C)    SpO2: 96% 100% 96% 97%  Weight:      Height:         Start Time: 0905 hrs. End Time: 0917 hrs.  Imaging Guidance (Spinal) for procedure #1:  Type of Imaging Technique: Fluoroscopy Guidance (Spinal) Indication(s): Assistance in needle guidance and placement for procedures requiring needle placement in or near specific anatomical locations not easily accessible without such assistance. Exposure Time: Please see nurses notes. Contrast: None used. Fluoroscopic Guidance: I was personally present during the use of fluoroscopy. "Tunnel Vision Technique" used to obtain the best possible view of the target area. Parallax error corrected before commencing the procedure. "Direction-depth-direction" technique used to introduce the needle under continuous pulsed  fluoroscopy. Once target was reached, antero-posterior, oblique, and lateral fluoroscopic projection used confirm needle placement in all planes. Images permanently stored in EMR. Interpretation: No contrast injected. I personally interpreted the imaging intraoperatively. Adequate needle placement confirmed in multiple planes. Permanent images saved into the patient's record.  Description of Procedure #2:  Safety Precautions: Aspiration looking for blood return was conducted prior to all injections. At no point did we inject any substances, as a needle was being advanced. No attempts were made at seeking any paresthesias. Safe injection practices and needle disposal techniques used. Medications properly checked for expiration dates. SDV (single dose vial) medications used. Description of the Procedure: Protocol guidelines were followed. The patient was placed in position over the fluoroscopy table. The target area was identified and the area prepped in the usual manner. Skin & deeper tissues infiltrated with local anesthetic. Appropriate amount of time allowed to pass for local anesthetics to take effect. The procedure needles were then advanced to the target area. Proper needle placement secured. Negative aspiration confirmed. Solution injected in intermittent fashion, asking for systemic symptoms every 0.5cc of injectate. The needles were then removed and the area cleansed, making sure to leave some of the prepping solution back to take advantage of its long term bactericidal properties. Vitals:   06/13/20 0920 06/13/20 0930 06/13/20 0940 06/13/20 0949  BP: 112/69 123/64 130/61 123/79  Pulse: 72 77    Resp: 13 16 18 16   Temp:  97.8 F (36.6 C)    SpO2: 96% 100% 96% 97%  Weight:      Height:        Start Time: 0905 hrs. End Time: 0917 hrs. Materials:  Needle(s) Type: Spinal Needle Gauge: 22G Length: 5.0-in Medication(s): Please see orders for medications and dosing details.  Imaging  Guidance (Non-Spinal) for procedure #2:  Type of Imaging Technique: Fluoroscopy Guidance (Non-Spinal) Indication(s): Assistance in needle guidance and placement for procedures requiring needle placement in or near specific anatomical locations not easily accessible without such assistance. Exposure Time: Please see nurses notes. Contrast: Before injecting any contrast, we confirmed that the patient did not have an allergy to iodine, shellfish, or radiological contrast. Once satisfactory needle placement was completed at the desired level, radiological contrast was injected. Contrast injected under live fluoroscopy. No contrast complications. See chart for type and volume of contrast used. Fluoroscopic Guidance: I was personally present during the use of fluoroscopy. "Tunnel Vision Technique" used to obtain the best possible view of the target area. Parallax error corrected before commencing the procedure. "Direction-depth-direction" technique used to introduce the needle under continuous pulsed fluoroscopy. Once target was reached, antero-posterior, oblique, and lateral fluoroscopic projection used confirm needle placement in all planes. Images permanently stored in EMR. Interpretation: I personally interpreted the imaging intraoperatively. Adequate needle placement confirmed in multiple planes. Appropriate spread of contrast into desired area was observed. No evidence of afferent or efferent intravascular uptake. Permanent images saved into the patient's record.  Antibiotic Prophylaxis:   Anti-infectives (From admission, onward)   None     Indication(s): None identified  Post-operative Assessment:  Post-procedure Vital Signs:  Pulse/HCG Rate: 7773 Temp: 97.8 F (36.6 C) Resp: 16 BP: 123/79 SpO2: 97 %  EBL: None  Complications: No immediate post-treatment complications observed by team, or reported by patient.  Note: The patient tolerated the entire procedure well. A repeat set of vitals  were taken after the procedure and the patient was kept under observation following institutional policy, for this type of procedure. Post-procedural neurological assessment was performed, showing return to baseline, prior to discharge. The patient was provided with post-procedure discharge instructions, including a section on how to identify potential problems. Should any problems arise concerning this procedure, the patient was given instructions to immediately contact us, at any time, without hesitation.  In any case, we plan to contact the patient by telephone for a follow-up status report regarding this interventional procedure.  Comments:  No additional relevant information.  Plan of Care  Orders:  Orders Placed This Encounter  Procedures  . LUMBAR FACET(MEDIAL BRANCH NERVE BLOCK) MBNB    Scheduling Instructions:     Procedure: Lumbar facet block (AKA.: Lumbosacral medial branch nerve block)     Side: Right-sided     Level: L3-4, L4-5, & L5-S1 Facets (L2, L3, L4, L5, & S1 Medial Branch Nerves)     Sedation: Patient's choice.     Timeframe: Today    Order Specific Question:   Where will this procedure be performed?    Answer:   [facility]  . HIP INJECTION    Scheduling Instructions:     Side: Left-sided     Sedation: Patient's choice.     Timeframe: Today  . DG PAIN CLINIC C-ARM 1-60 MIN NO REPORT    Intraoperative interpretation by procedural physician at [facility].    Standing Status:   Standing    Number of Occurrences:   1    Order Specific Question:   Reason for exam:    Answer:   Assistance in needle guidance and placement for procedures requiring needle placement in or near specific anatomical locations not easily accessible without such assistance.  . CT CERVICAL SPINE WO CONTRAST    Patient presents with axial pain with possible radicular component.  In addition to any acute findings, please report on:  1. Facet (Zygapophyseal) joint DJD  (Hypertrophy, space narrowing, subchondral sclerosis, and/or osteophyte formation) 2. DDD and/or IVDD (Loss of disc height, desiccation or "Black disc disease") 3. Pars defects 4. Spondylolisthesis, spondylosis, and/or spondyloarthropathies (include Degree/Grade of displacement in mm) 5. Vertebral body Fractures, including age (old, new/acute) 33. Modic Type Changes 7. Demineralization 8. Bone pathology 9. Central, Lateral Recess, and/or Foraminal Stenosis (include AP diameter of stenosis in mm) 10. Surgical changes (hardware type, status, and presence of fibrosis) NOTE: Please specify level(s) and laterality.    Standing Status:   Future    Standing Expiration Date:   07/14/2020    Scheduling Instructions:     Imaging must be done as soon as possible. Inform patient that order will expire within 30 days and I will not renew it.    Order Specific Question:   Preferred imaging location?    Answer:   [facility]    Order Specific Question:   Call Results- Best Contact Number?    Answer:   [phone] ([facility])    Order Specific Question:   Radiology Contrast Protocol - do NOT remove file path    Answer:   [email or web address]  . DG Cervical Spine With Flex & Extend    Patient presents with axial pain with possible radicular component.  Please evaluate for any evidence of cervical spine instability. Describe the presence of any spondylolisthesis (Antero- or retrolisthesis). If present, provide displacement "Grade" and measurement in cm. Please describe presence and specific location (Level & Laterality) of any signs of  osteoarthritis, zygapophyseal (Facet) joints DJD (including decreased joint space and/or osteophytosis), DDD, Foraminal narrowing, as well as any sclerosis and/or cyst formation. Please comment on ROM. In addition to any acute findings, please report on:  1. Facet (Zygapophyseal) joint DJD (Hypertrophy, space narrowing, subchondral  sclerosis, and/or osteophyte formation) 2. DDD and/or IVDD (Loss of disc height, desiccation or "Black disc disease") 3. Pars defects 4. Spondylolisthesis, spondylosis,  and/or spondyloarthropathies (include Degree/Grade of displacement in mm) 5. Vertebral body Fractures, including age (old, new/acute) 73. Modic Type Changes 7. Demineralization 8. Bone pathology 9. Central, Lateral Recess, and/or Foraminal Stenosis (include AP diameter of stenosis in mm) 10. Surgical changes (hardware type, status, and presence of fibrosis) NOTE: Please specify level(s) and laterality. If applicable: Please indicate ROM and/or evidence of instability (>30mm displacement between flexion and extension views)    Standing Status:   Future    Standing Expiration Date:   07/14/2020    Scheduling Instructions:     Imaging must be done as soon as possible. Inform patient that order will expire within 30 days and I will not renew it.    Order Specific Question:   Reason for Exam (SYMPTOM  OR DIAGNOSIS REQUIRED)    Answer:   Cervicalgia    Order Specific Question:   Preferred imaging location?    Answer:   [facility]    Order Specific Question:   Call Results- Best Contact Number?    Answer:   [phone] ([facility])    Order Specific Question:   Radiology Contrast Protocol - do NOT remove file path    Answer:   [email or web address]    Order Specific Question:   Release to patient    Answer:   Immediate  . Informed Consent Details: Physician/Practitioner Attestation; Transcribe to consent form and obtain patient signature    Nursing Order: Transcribe to consent form and obtain patient signature. Note: Always confirm laterality of pain with Ms. Prosser, before procedure.    Order Specific Question:   Physician/Practitioner attestation of informed consent for procedure/surgical case    Answer:   I, the physician/practitioner, attest that I have discussed with the  patient the benefits, risks, side effects, alternatives, likelihood of achieving goals and potential problems during recovery for the procedure that I have provided informed consent.    Order Specific Question:   Procedure    Answer:   Lumbar Facet Block  under fluoroscopic guidance    Order Specific Question:   Physician/Practitioner performing the procedure    Answer:   Tinesha Siegrist A. Dossie Arbour MD    Order Specific Question:   Indication/Reason    Answer:   Low Back Pain, with our without leg pain, due to Facet Joint Arthralgia (Joint Pain) Spondylosis (Arthritis of the Spine), without myelopathy or radiculopathy (Nerve Damage).  . Provide equipment / supplies at bedside    "Block Tray" (Disposable  single use) Needle type: SpinalSpinal Amount/quantity: 4 Size: Regular (3.5-inch) Gauge: 22G    Standing Status:   Standing    Number of Occurrences:   1    Order Specific Question:   Specify    Answer:   Block Tray  . Informed Consent Details: Physician/Practitioner Attestation; Transcribe to consent form and obtain patient signature    Nursing Order: Transcribe to consent form and obtain patient signature. Note: Always confirm laterality of pain with Ms. Mchatton, before procedure.    Order Specific Question:   Physician/Practitioner attestation of informed consent for procedure/surgical case    Answer:   I, the physician/practitioner, attest that I have discussed with the patient the benefits, risks, side effects, alternatives, likelihood of achieving goals and potential problems during recovery for the procedure that I have provided informed consent.    Order Specific Question:   Procedure    Answer:   Hip injection    Order Specific Question:   Physician/Practitioner performing the procedure    Answer:  Damien Cisar A. Dossie Arbour, MD    Order Specific Question:   Indication/Reason    Answer:   Hip Joint Pain (Arthralgia)  . Provide equipment / supplies at bedside    "Block Tray" (Disposable   single use) Needle type: SpinalSpinal Amount/quantity: 1 Size: Long (7-inch) Gauge: 22G    Standing Status:   Standing    Number of Occurrences:   1    Order Specific Question:   Specify    Answer:   Block Tray   Chronic Opioid Analgesic:  Oxycodone/APAP 7.5/325, 1 tab PO q 5 times a day. (56.25 mg/day of oxycodone) MME/day:56.25 mg/day.   Medications ordered for procedure: Meds ordered this encounter  Medications  . lactated ringers infusion 1,000 mL  . midazolam (VERSED) 5 MG/5ML injection 1-2 mg    Make sure Flumazenil is available in the pyxis when using this medication. If oversedation occurs, administer 0.2 mg IV over 15 sec. If after 45 sec no response, administer 0.2 mg again over 1 min; may repeat at 1 min intervals; not to exceed 4 doses (1 mg)  . fentaNYL (SUBLIMAZE) injection 25-50 mcg    Make sure Narcan is available in the pyxis when using this medication. In the event of respiratory depression (RR< 8/min): Titrate NARCAN (naloxone) in increments of 0.1 to 0.2 mg IV at 2-3 minute intervals, until desired degree of reversal.  . ropivacaine (PF) 2 mg/mL (0.2%) (NAROPIN) injection 9 mL  . triamcinolone acetonide (KENALOG-40) injection 40 mg  . ropivacaine (PF) 2 mg/mL (0.2%) (NAROPIN) injection 9 mL  . methylPREDNISolone acetate (DEPO-MEDROL) injection 80 mg  . iohexol (OMNIPAQUE) 180 MG/ML injection 10 mL    Must be Myelogram-compatible. If not available, you may substitute with a water-soluble, non-ionic, hypoallergenic, myelogram-compatible radiological contrast medium.   Medications administered: We administered lactated ringers, midazolam, fentaNYL, ropivacaine (PF) 2 mg/mL (0.2%), triamcinolone acetonide, ropivacaine (PF) 2 mg/mL (0.2%), and methylPREDNISolone acetate.  See the medical record for exact dosing, route, and time of administration.  Follow-up plan:   Return in about 2 weeks (around 06/27/2020) for (VIrtual), (PP) Follow-up.       Interventional  management options:  Considering:   Diagnostic caudal epidural steroid injection + diagnostic epidurogram Diagnostic CESI Diagnostic bilateral cervical facet block Possible bilateral cervical facet RFA  Diagnostic bilateral lumbar facet block Possible bilateral lumbar facet RFA  Diagnostic left L4-5 LESI  Diagnostic left S1 SNRB Diagnostic bilateral SI joint block Possible bilateral SI joint RFA  Diagnostic right trochanteric bursa injection   Palliative PRN treatment(s):   Palliativebilateral lumbar facet block #3+bilateral sacroiliac joint block#6 PalliativeCESI  Palliativebilateral cervical facet block Palliativeleft L4-5 LESI  Palliativeleft caudal ESI  Palliativeleft S1 SNRB Palliativebilateral SI joint block Palliativeright trochanteric bursa injection    Recent Visits Date Type Provider Dept  05/24/20 Telemedicine Milinda Pointer, MD [facility]  03/22/20 Telemedicine Gillis Santa, MD [facility]  Showing recent visits within past 90 days and meeting all other requirements Today's Visits Date Type Provider Dept  06/13/20 Procedure visit Milinda Pointer, MD [facility]  Showing today's visits and meeting all other requirements Future Appointments Date Type Provider Dept  06/28/20 Appointment Milinda Pointer, MD [facility]  Showing future appointments within next 90 days and meeting all other requirements  Disposition: Discharge home  Discharge (Date  Time): 06/13/2020; 0950 hrs.   Primary Care Physician: Perrin Maltese, MD Location: [facility] Note by: Gaspar Cola, MD Date: 06/13/2020; Time: 11:07 AM  Disclaimer:  Medicine is not an Chief Strategy Officer. The only guarantee in medicine is that nothing is guaranteed. It is important to note that the decision to proceed with this intervention was based on the information collected from the patient. The Data and  conclusions were drawn from the patient's questionnaire, the interview, and the physical examination. Because the information was provided in large part by the patient, it cannot be guaranteed that it has not been purposely or unconsciously manipulated. Every effort has been made to obtain as much relevant data as possible for this evaluation. It is important to note that the conclusions that lead to this procedure are derived in large part from the available data. Always take into account that the treatment will also be dependent on availability of resources and existing treatment guidelines, considered by other Pain Management Practitioners as being common knowledge and practice, at the time of the intervention. For Medico-Legal purposes, it is also important to point out that variation in procedural techniques and pharmacological choices are the acceptable norm. The indications, contraindications, technique, and results of the above procedure should only be interpreted and judged by a Board-Certified Interventional Pain Specialist with extensive familiarity and expertise in the same exact procedure and technique.

## 2020-06-13 ENCOUNTER — Ambulatory Visit (HOSPITAL_BASED_OUTPATIENT_CLINIC_OR_DEPARTMENT_OTHER): Payer: Medicare Other | Admitting: Pain Medicine

## 2020-06-13 ENCOUNTER — Encounter: Payer: Self-pay | Admitting: Pain Medicine

## 2020-06-13 ENCOUNTER — Ambulatory Visit
Admission: RE | Admit: 2020-06-13 | Discharge: 2020-06-13 | Disposition: A | Discharge status: Home / Self Care | Payer: Medicare Other | Source: Ambulatory Visit | Attending: Pain Medicine | Admitting: Pain Medicine

## 2020-06-13 ENCOUNTER — Other Ambulatory Visit: Payer: Self-pay

## 2020-06-13 VITALS — BP 123/79 | HR 77 | Temp 97.8°F | Resp 16 | Ht 60.0 in | Wt 150.0 lb

## 2020-06-13 DIAGNOSIS — M5136 Other intervertebral disc degeneration, lumbar region: Secondary | ICD-10-CM | POA: Insufficient documentation

## 2020-06-13 DIAGNOSIS — M7061 Trochanteric bursitis, right hip: Secondary | ICD-10-CM | POA: Insufficient documentation

## 2020-06-13 DIAGNOSIS — M5030 Other cervical disc degeneration, unspecified cervical region: Secondary | ICD-10-CM | POA: Diagnosis present

## 2020-06-13 DIAGNOSIS — M25552 Pain in left hip: Secondary | ICD-10-CM

## 2020-06-13 DIAGNOSIS — M4316 Spondylolisthesis, lumbar region: Secondary | ICD-10-CM | POA: Insufficient documentation

## 2020-06-13 DIAGNOSIS — M47812 Spondylosis without myelopathy or radiculopathy, cervical region: Secondary | ICD-10-CM | POA: Diagnosis present

## 2020-06-13 DIAGNOSIS — G8929 Other chronic pain: Secondary | ICD-10-CM

## 2020-06-13 DIAGNOSIS — M7062 Trochanteric bursitis, left hip: Secondary | ICD-10-CM

## 2020-06-13 DIAGNOSIS — G4486 Cervicogenic headache: Secondary | ICD-10-CM | POA: Insufficient documentation

## 2020-06-13 DIAGNOSIS — R519 Headache, unspecified: Secondary | ICD-10-CM | POA: Insufficient documentation

## 2020-06-13 DIAGNOSIS — M47817 Spondylosis without myelopathy or radiculopathy, lumbosacral region: Secondary | ICD-10-CM | POA: Diagnosis present

## 2020-06-13 DIAGNOSIS — M542 Cervicalgia: Secondary | ICD-10-CM

## 2020-06-13 DIAGNOSIS — M1612 Unilateral primary osteoarthritis, left hip: Secondary | ICD-10-CM

## 2020-06-13 DIAGNOSIS — M4696 Unspecified inflammatory spondylopathy, lumbar region: Secondary | ICD-10-CM

## 2020-06-13 DIAGNOSIS — M25551 Pain in right hip: Secondary | ICD-10-CM | POA: Diagnosis present

## 2020-06-13 DIAGNOSIS — Z981 Arthrodesis status: Secondary | ICD-10-CM

## 2020-06-13 DIAGNOSIS — M47816 Spondylosis without myelopathy or radiculopathy, lumbar region: Secondary | ICD-10-CM

## 2020-06-13 DIAGNOSIS — M4036 Flatback syndrome, lumbar region: Secondary | ICD-10-CM

## 2020-06-13 MED ORDER — LACTATED RINGERS IV SOLN
1000.0000 mL | Freq: Once | INTRAVENOUS | Status: AC
  Administered 2020-06-13: 1000 mL via INTRAVENOUS

## 2020-06-13 MED ORDER — METHYLPREDNISOLONE ACETATE 80 MG/ML IJ SUSP
80.0000 mg | Freq: Once | INTRAMUSCULAR | Status: AC
  Administered 2020-06-13: 80 mg via INTRA_ARTICULAR
  Filled 2020-06-13: qty 1

## 2020-06-13 MED ORDER — MIDAZOLAM HCL 5 MG/5ML IJ SOLN
1.0000 mg | INTRAMUSCULAR | Status: DC | PRN
  Administered 2020-06-13: 2 mg via INTRAVENOUS
  Filled 2020-06-13: qty 5

## 2020-06-13 MED ORDER — FENTANYL CITRATE (PF) 100 MCG/2ML IJ SOLN
25.0000 ug | INTRAMUSCULAR | Status: DC | PRN
  Administered 2020-06-13: 50 ug via INTRAVENOUS
  Filled 2020-06-13: qty 2

## 2020-06-13 MED ORDER — TRIAMCINOLONE ACETONIDE 40 MG/ML IJ SUSP
40.0000 mg | Freq: Once | INTRAMUSCULAR | Status: AC
  Administered 2020-06-13: 40 mg
  Filled 2020-06-13: qty 1

## 2020-06-13 MED ORDER — ROPIVACAINE HCL 2 MG/ML IJ SOLN
9.0000 mL | Freq: Once | INTRAMUSCULAR | Status: AC
  Administered 2020-06-13: 9 mL via PERINEURAL
  Filled 2020-06-13: qty 10

## 2020-06-13 MED ORDER — ROPIVACAINE HCL 2 MG/ML IJ SOLN
9.0000 mL | Freq: Once | INTRAMUSCULAR | Status: AC
  Administered 2020-06-13: 9 mL via INTRA_ARTICULAR
  Filled 2020-06-13: qty 10

## 2020-06-13 MED ORDER — IOHEXOL 180 MG/ML  SOLN
10.0000 mL | Freq: Once | INTRAMUSCULAR | Status: AC
  Administered 2020-06-13: 10 mL via INTRA_ARTICULAR

## 2020-06-13 NOTE — Addendum Note (Signed)
Addended by: Milinda Pointer A on: 06/13/2020 11:07 AM   Modules accepted: Orders

## 2020-06-13 NOTE — Patient Instructions (Addendum)
____________________________________________________________________________________________  Post-Procedure Discharge Instructions  Instructions:  Apply ice:   Purpose: This will minimize any swelling and discomfort after procedure.   When: Day of procedure, as soon as you get home.  How: Fill a plastic sandwich bag with crushed ice. Cover it with a small towel and apply to injection site.  How long: (15 min on, 15 min off) Apply for 15 minutes then remove x 15 minutes.  Repeat sequence on day of procedure, until you go to bed.  Apply heat:   Purpose: To treat any soreness and discomfort from the procedure.  When: Starting the next day after the procedure.  How: Apply heat to procedure site starting the day following the procedure.  How long: May continue to repeat daily, until discomfort goes away.  Food intake: Start with clear liquids (like water) and advance to regular food, as tolerated.   Physical activities: Keep activities to a minimum for the first 8 hours after the procedure. After that, then as tolerated.  Driving: If you have received any sedation, be responsible and do not drive. You are not allowed to drive for 24 hours after having sedation.  Blood thinner: (Applies only to those taking blood thinners) You may restart your blood thinner 6 hours after your procedure.  Insulin: (Applies only to Diabetic patients taking insulin) As soon as you can eat, you may resume your normal dosing schedule.  Infection prevention: Keep procedure site clean and dry. Shower daily and clean area with soap and water.  Post-procedure Pain Diary: Extremely important that this be done correctly and accurately. Recorded information will be used to determine the next step in treatment. For the purpose of accuracy, follow these rules:  Evaluate only the area treated. Do not report or include pain from an untreated area. For the purpose of this evaluation, ignore all other areas of pain,  except for the treated area.  After your procedure, avoid taking a long nap and attempting to complete the pain diary after you wake up. Instead, set your alarm clock to go off every hour, on the hour, for the initial 8 hours after the procedure. Document the duration of the numbing medicine, and the relief you are getting from it.  Do not go to sleep and attempt to complete it later. It will not be accurate. If you received sedation, it is likely that you were given a medication that may cause amnesia. Because of this, completing the diary at a later time may cause the information to be inaccurate. This information is needed to plan your care.  Follow-up appointment: Keep your post-procedure follow-up evaluation appointment after the procedure (usually 2 weeks for most procedures, 6 weeks for radiofrequencies). DO NOT FORGET to bring you pain diary with you.   Expect: (What should I expect to see with my procedure?)  From numbing medicine (AKA: Local Anesthetics): Numbness or decrease in pain. You may also experience some weakness, which if present, could last for the duration of the local anesthetic.  Onset: Full effect within 15 minutes of injected.  Duration: It will depend on the type of local anesthetic used. On the average, 1 to 8 hours.   From steroids (Applies only if steroids were used): Decrease in swelling or inflammation. Once inflammation is improved, relief of the pain will follow.  Onset of benefits: Depends on the amount of swelling present. The more swelling, the longer it will take for the benefits to be seen. In some cases, up to 10 days.    Duration: Steroids will stay in the system x 2 weeks. Duration of benefits will depend on multiple posibilities including persistent irritating factors.  Side-effects: If present, they may typically last 2 weeks (the duration of the steroids).  Frequent: Cramps (if they occur, drink Gatorade and take over-the-counter Magnesium 450-500 mg  once to twice a day); water retention with temporary weight gain; increases in blood sugar; decreased immune system response; increased appetite.  Occasional: Facial flushing (red, warm cheeks); mood swings; menstrual changes.  Uncommon: Long-term decrease or suppression of natural hormones; bone thinning. (These are more common with higher doses or more frequent use. This is why we prefer that our patients avoid having any injection therapies in other practices.)   Very Rare: Severe mood changes; psychosis; aseptic necrosis.  From procedure: Some discomfort is to be expected once the numbing medicine wears off. This should be minimal if ice and heat are applied as instructed.  Call if: (When should I call?)  You experience numbness and weakness that gets worse with time, as opposed to wearing off.  New onset bowel or bladder incontinence. (Applies only to procedures done in the spine)  Emergency Numbers:  Durning business hours (Monday - Thursday, 8:00 AM - 4:00 PM) (Friday, 9:00 AM - 12:00 Noon): [phone]  After hours: [phone]  NOTE: If you are having a problem and are unable connect with, or to talk to a provider, then go to your nearest urgent care or emergency department. If the problem is serious and urgent, please call 911. ____________________________________________________________________________________________   ____________________________________________________________________________________________  Virtual Visits   Eligibility In order to be eligible for "Virtual Visit Encounters", you must be available over the phone. It is the patient's responsibility to make sure we have a way to contact you.  We understand how people are reluctant to pickup on "unknown" calls, therefore, we suggest adding our telephone numbers to your list of "CONTACT(s)". This way, you should be able to readily identify our calls when you receive them.  When will this type of  visits be used? The decision will be made on a case by case basis.  At what time will I be called? This is an excellent question. The providers will try to call you during the day, whenever they have spare time. For the purpose of the day's schedule, you are assigned a time for your appointment, however, the call may come anytime during the day. We need you to be available on a moment's notice. If you are unable to do this, then request an "in-person" appointment rather than a "virtual visit".  Can I request my medication visits to be "Virtual"? Yes you may request it, but the decision is entirely up to the healthcare provider. Control substances require specific monitoring that requires Face-to-Face encounters. The number of encounters  and the extent of the monitoring is determined on a case by case basis.  Add a new contact to your smart phone and label it "PAIN CLINIC" Under this contact add the following numbers: Main: [phone] (Official Contact Number) Nurses: [phone] (These are outgoing only calling systems. Do not call this number.) Dr. Dossie Arbour: [phone] or [phone] (Outgoing calls only. Do not call this number.)  ____________________________________________________________________________________________   Pain Management Discharge Instructions  General Discharge Instructions :  If you need to reach your doctor call: Monday-Friday 8:00 am - 4:00 pm at [phone] or toll free [phone].  After clinic hours [phone] to have operator reach doctor.  Bring all  of your medication bottles to all your appointments in the pain clinic.  To cancel or reschedule your appointment with Pain Management please remember to call 24 hours in advance to avoid a fee.  Refer to the educational materials which you have been given on: General Risks, I had my Procedure. Discharge Instructions, Post Sedation.  Post Procedure Instructions:  The drugs you were given  will stay in your system until tomorrow, so for the next 24 hours you should not drive, make any legal decisions or drink any alcoholic beverages.  You may eat anything you prefer, but it is better to start with liquids then soups and crackers, and gradually work up to solid foods.  Please notify your doctor immediately if you have any unusual bleeding, trouble breathing or pain that is not related to your normal pain.  Depending on the type of procedure that was done, some parts of your body may feel week and/or numb.  This usually clears up by tonight or the next day.  Walk with the use of an assistive device or accompanied by an adult for the 24 hours.  You may use ice on the affected area for the first 24 hours.  Put ice in a Ziploc bag and cover with a towel and place against area 15 minutes on 15 minutes off.  You may switch to heat after 24 hours.

## 2020-06-13 NOTE — Progress Notes (Signed)
Safety precautions to be maintained throughout the outpatient stay will include: orient to surroundings, keep bed in low position, maintain call bell within reach at all times, provide assistance with transfer out of bed and ambulation.  

## 2020-06-14 ENCOUNTER — Telehealth: Payer: Self-pay

## 2020-06-14 NOTE — Telephone Encounter (Signed)
Post procedure phone call.  Patient states she is doing well.  

## 2020-06-27 ENCOUNTER — Encounter: Payer: Self-pay | Admitting: Pain Medicine

## 2020-06-27 NOTE — Progress Notes (Signed)
Patient: Jacqueline Thornton  Service Category: E/M  Provider: Gaspar Cola, MD  DOB: [birth year]  DOS: 06/28/2020  Location: Office  MRN: [id number]  Setting: Ambulatory outpatient  Referring Provider: Perrin Maltese, MD  Type: Established Patient  Specialty: Interventional Pain Management  PCP: Perrin Maltese, MD  Location: Remote location  Delivery: TeleHealth     Virtual Encounter - Pain Management PROVIDER NOTE: Information contained herein reflects review and annotations entered in association with encounter. Interpretation of such information and data should be left to medically-trained personnel. Information provided to patient can be located elsewhere in the medical record under "Patient Instructions". Document created using STT-dictation technology, any transcriptional errors that may result from process are unintentional.    Contact & Pharmacy Preferred: [phone] Home: [phone] (home) Mobile: [phone] (mobile) E-mail: [email or web address]  [facility] ([address] [place] [facility] [place] [address]) [place] [address] Phone: [phone] Fax: [phone]   Pre-screening  Jacqueline Thornton offered "in-person" vs "virtual" encounter. She indicated preferring virtual for this encounter.   Reason COVID-19*  Social distancing based on CDC and AMA recommendations.   I contacted Jacqueline Thornton on 06/28/2020 via telephone.      I clearly identified myself as Gaspar Cola, MD. I verified that I was speaking with the correct person using two identifiers (Name: VICTORA Thornton, and date of birth: 1951-01-04).  Consent I sought verbal advanced consent from Jacqueline Thornton for virtual visit interactions. I informed Jacqueline Thornton of possible security and privacy concerns, risks, and limitations associated with providing "not-in-person" medical evaluation and management services. I also informed Jacqueline Thornton of the  availability of "in-person" appointments. Finally, I informed her that there would be a charge for the virtual visit and that she could be  personally, fully or partially, financially responsible for it. Jacqueline Thornton expressed understanding and agreed to proceed.   Historic Elements   Jacqueline Thornton is a 69 y.o. year old, female patient evaluated today after our last contact on 06/13/2020. 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 She also  has a past surgical history that includes Cholecystectomy; Abdominal hysterectomy; Neck surgery; Shoulder surgery; Colonoscopy with propofol (N/A, 07/19/2015); caract surger; Laparoscopic salpingo oophorectomy (Bilateral, 03/03/2018); Cystoscopy (03/03/2018); Tonsillectomy; Appendectomy; Tumor removal; Anterior lumbar fusion (N/A, 12/11/2018); and Abdominal exposure (N/A, 12/11/2018). Jacqueline Thornton has a current medication list which includes the following prescription(s): nexletol, vitamin d3, vitamin b-12, cyclobenzaprine, duloxetine, enalapril, esomeprazole, estradiol, hemocyte-plus, furosemide, gabapentin, melatonin, meloxicam, methocarbamol, oxycodone-acetaminophen, [START ON 07/20/2020] oxycodone-acetaminophen, [START ON 08/19/2020] oxycodone-acetaminophen, rosuvastatin, and sertraline. She  reports that she quit smoking about 17 years ago. She has  never used smokeless tobacco. She reports that she does not drink alcohol and does not use drugs. Jacqueline Thornton has No Known Allergies.   HPI  Today, she is being contacted for a post-procedure assessment.  The patient indicates that after the injection she seemed to have developed a trigger point in the lower back, but this is actually feeling better now.  She  refers that her pain has improved and he has gone down to about a 2/10.  In the case of the hip, she refers having 100% relief of the pain in that area and currently doing really well on improving.  She seems to be regaining range of motion and function of that hip.  Indicates of the right lower back, she refers that the pain is improving but she is now experiencing pain in a different area.  She says that she is certain around the tailbone area and sacral area in the midline.  Today we spoke about possible alternatives to treat this and I have offered the patient a caudal epidural steroid injection which she has indicated she would like to have done.  I will go ahead and schedule that as soon as possible.  RTCB: 09/18/2020 for medication management.   Nonopioids transferred 05/24/2020: Vitamin B12, Flexeril, and Neurontin.  Post-Procedure Evaluation  Procedure (06/13/2020): Palliative right lumbar facet block + diagnostic left IA hip joint injection #1 under fluoroscopic guidance and IV sedation Pre-procedure pain level: 5/10 Post-procedure: 2/10 (> 50% relief)  Sedation: Sedation provided.  Effectiveness during initial hour after procedure(Ultra-Short Term Relief): 100 %.  Local anesthetic used: Long-acting (4-6 hours) Effectiveness: Defined as any analgesic benefit obtained secondary to the administration of local anesthetics. This carries significant diagnostic value as to the etiological location, or anatomical origin, of the pain. Duration of benefit is expected to coincide with the duration of the local anesthetic used.  Effectiveness  during initial 4-6 hours after procedure(Short-Term Relief): 100 %.  Long-term benefit: Defined as any relief past the pharmacologic duration of the local anesthetics.  Effectiveness past the initial 6 hours after procedure(Long-Term Relief): 30 % (last 3 days, pain is back to where it was before procedure, was good as the last one).  Current benefits: Defined as benefit that persist at this time.   Analgesia:  >50% relief.  She refers currently having 100% relief of the left hip pain and more than 50% relief of her right lower back pain.  What seems to be new is pain in the midline over the sacral and coccygeal area. Function: Somewhat improved ROM: Somewhat improved  Pharmacotherapy Assessment  Analgesic: Oxycodone/APAP 7.5/325, 1 tab PO q 5 times a day. (56.25 mg/day of oxycodone) MME/day:56.25 mg/day.   Monitoring: [place] PMP: PDMP reviewed during this encounter.       Pharmacotherapy: No side-effects or adverse reactions reported. Compliance: No problems identified. Effectiveness: Clinically acceptable. Plan: Refer to "POC".  UDS:  Summary  Date Value Ref Range Status  12/16/2019 Note  Final    Comment:    ==================================================================== [employer] [facility] (MW) ==================================================================== Test                             Result       Flag       Units  Drug Present and Declared for Prescription Verification   Oxycodone                      3206         EXPECTED   ng/mg creat   Oxymorphone                    1043         EXPECTED   ng/mg creat   Noroxycodone  3386         EXPECTED   ng/mg creat   Noroxymorphone                 175          EXPECTED   ng/mg creat    Sources of oxycodone are scheduled prescription medications.    Oxymorphone, noroxycodone, and noroxymorphone are expected    metabolites of oxycodone. Oxymorphone is also available as a    scheduled prescription  medication.  ==================================================================== Test                      Result    Flag   Units      Ref Range   Creatinine              95               mg/dL      >=41 ==================================================================== Declared Medications:  The flagging and interpretation on this report are based on the  following declared medications.  Unexpected results may arise from  inaccuracies in the declared medications.   **Note: The testing scope of this panel includes these medications:   Oxycodone (Percocet)   **Note: The testing scope of this panel does not include the  following reported medications:   Acetaminophen (Percocet)  Cyclobenzaprine (Flexeril)  Duloxetine (Cymbalta)  Enalapril (Vasotec)  Esomeprazole (Nexium)  Estradiol (Estrace)  Gabapentin (Neurontin)  Hydrochlorothiazide (Hydrodiuril)  Melatonin  Rivaroxaban (Xarelto)  Rosuvastatin (Crestor)  Sertraline (Zoloft)  Vitamin B12  Vitamin D3 ==================================================================== For clinical consultation, please call [phone]. ====================================================================     Laboratory Chemistry Profile   Renal Lab Results  Component Value Date   BUN 11 12/16/2019   CREATININE 1.35 (H) 12/16/2019   BCR 8 (L) 12/16/2019   GFRAA 47 (L) 12/16/2019   GFRNONAA 41 (L) 12/16/2019     Hepatic Lab Results  Component Value Date   AST 15 12/16/2019   ALT 14 03/08/2018   ALBUMIN 4.0 12/16/2019   ALKPHOS 60 12/16/2019     Electrolytes Lab Results  Component Value Date   NA 140 12/16/2019   K 4.5 12/16/2019   CL 100 12/16/2019   CALCIUM 8.8 12/16/2019   MG 2.4 (H) 12/16/2019     Bone Lab Results  Component Value Date   25OHVITD1 53 12/16/2019   25OHVITD2 <1.0 12/16/2019   25OHVITD3 53 12/16/2019     Inflammation (CRP: Acute Phase) (ESR: Chronic Phase) Lab Results  Component Value  Date   CRP 4 12/16/2019   ESRSEDRATE 6 12/16/2019       Note: Above Lab results reviewed.  Imaging  DG PAIN CLINIC C-ARM 1-60 MIN NO REPORT Fluoro was used, but no Radiologist interpretation will be provided.  Please refer to "NOTES" tab for provider progress note.  Assessment  The primary encounter diagnosis was Chronic pain syndrome. Diagnoses of Chronic hip pain (Bilateral) (L>R), Osteoarthritis of hip (Left), Trochanteric bursitis of hip (Bilateral) (L>R), Lumbar facet syndrome (Bilateral) (R>L), Pharmacologic therapy, Uncomplicated opioid dependence (HCC), DDD (degenerative disc disease), lumbar, and Failed back surgical syndrome were also pertinent to this visit.  Plan of Care  Problem-specific:  No problem-specific Assessment & Plan notes found for this encounter.  Ms. KATHREN SCEARCE has a current medication list which includes the following long-term medication(s): vitamin b-12, cyclobenzaprine, duloxetine, enalapril, esomeprazole, furosemide, gabapentin, oxycodone-acetaminophen, [START ON 07/20/2020] oxycodone-acetaminophen, [START ON 08/19/2020] oxycodone-acetaminophen, and sertraline.  Pharmacotherapy (Medications Ordered): No orders  of the defined types were placed in this encounter.  Orders:  Orders Placed This Encounter  Procedures  . Caudal Epidural Injection    Standing Status:   Future    Standing Expiration Date:   07/29/2020    Scheduling Instructions:     Laterality: Midline     Level(s): Sacrococcygeal canal (Tailbone area)     Sedation: Patient's choice     Scheduling Timeframe: As soon as pre-approved    Order Specific Question:   Where will this procedure be performed?    Answer:   [facility]   Follow-up plan:   Return for Procedure (w/ sedation): (ML) Caudal ESI #1.     Interventional Therapies  Risk  Complexity Considerations:   WNL   Planned  Pending:   Pending further evaluation   Under consideration:   Diagnostic caudal epidural  steroid injection + diagnostic epidurogram Diagnostic CESI Diagnostic bilateral cervical facet block Possible bilateral cervical facet RFA  Diagnostic left L4-5 LESI  Diagnostic left S1 SNRB   Completed:   Palliative left L5-S1 LESI x2 (07/27/2015)  Diagnostic left lumbar facet MBB x6 (05/14/2018)  Palliative right lumbar facet MBB x9 (06/13/2020)  Therapeutic right shoulder joint injection x1 (02/29/2016)  Therapeutic left IA hip injection x3 (06/13/2020)  Diagnostic right SI joint block x3 (06/17/2017)  Diagnostic/therapeutic left SI joint block x4 (06/17/2017)  Palliative bilateral trochanteric bursa injection x1 (12/11/2016)  Therapeutic right lumbar facet MB RFA x1 (08/07/2017)  Therapeutic left lumbar facet MB RFA x1 (09/16/2017)  Therapeutic right SI joint RFA x1 (08/07/2017)  Therapeutic left SI joint RFA x1 (09/16/2017)    Therapeutic  Palliative (PRN) options:   Palliativebilateral lumbar facet block  Palliativebilateral SI joint block Palliativeright trochanteric bursa injection    Recent Visits Date Type Provider Dept  06/13/20 Procedure visit Milinda Pointer, MD [facility]  05/24/20 Telemedicine Milinda Pointer, MD [facility]  Showing recent visits within past 90 days and meeting all other requirements Today's Visits Date Type Provider Dept  06/28/20 Telemedicine Milinda Pointer, MD [facility]  Showing today's visits and meeting all other requirements Future Appointments No visits were found meeting these conditions. Showing future appointments within next 90 days and meeting all other requirements  I discussed the assessment and treatment plan with the patient. The patient was provided an opportunity to ask questions and all were answered. The patient agreed with the plan and demonstrated an understanding of the instructions.  Patient advised to call back or seek an in-person evaluation if the symptoms or condition  worsens.  Duration of encounter: 15 minutes.  Note by: Gaspar Cola, MD Date: 06/28/2020; Time: 4:59 PM

## 2020-06-28 ENCOUNTER — Ambulatory Visit: Payer: Medicare Other | Attending: Pain Medicine | Admitting: Pain Medicine

## 2020-06-28 ENCOUNTER — Other Ambulatory Visit: Payer: Self-pay

## 2020-06-28 DIAGNOSIS — M25551 Pain in right hip: Secondary | ICD-10-CM

## 2020-06-28 DIAGNOSIS — M7062 Trochanteric bursitis, left hip: Secondary | ICD-10-CM

## 2020-06-28 DIAGNOSIS — M7061 Trochanteric bursitis, right hip: Secondary | ICD-10-CM

## 2020-06-28 DIAGNOSIS — M25552 Pain in left hip: Secondary | ICD-10-CM

## 2020-06-28 DIAGNOSIS — G894 Chronic pain syndrome: Secondary | ICD-10-CM | POA: Diagnosis not present

## 2020-06-28 DIAGNOSIS — M961 Postlaminectomy syndrome, not elsewhere classified: Secondary | ICD-10-CM

## 2020-06-28 DIAGNOSIS — M1612 Unilateral primary osteoarthritis, left hip: Secondary | ICD-10-CM | POA: Diagnosis not present

## 2020-06-28 DIAGNOSIS — Z79899 Other long term (current) drug therapy: Secondary | ICD-10-CM

## 2020-06-28 DIAGNOSIS — M47816 Spondylosis without myelopathy or radiculopathy, lumbar region: Secondary | ICD-10-CM

## 2020-06-28 DIAGNOSIS — F1120 Opioid dependence, uncomplicated: Secondary | ICD-10-CM

## 2020-06-28 DIAGNOSIS — M5136 Other intervertebral disc degeneration, lumbar region: Secondary | ICD-10-CM

## 2020-06-28 NOTE — Patient Instructions (Signed)
____________________________________________________________________________________________  Preparing for Procedure with Sedation  Procedure appointments are limited to planned procedures: . No Prescription Refills. . No disability issues will be discussed. . No medication changes will be discussed.  Instructions: . Oral Intake: Do not eat or drink anything for at least 8 hours prior to your procedure. (Exception: Blood Pressure Medication. See below.) . Transportation: Unless otherwise stated by your physician, you may drive yourself after the procedure. . Blood Pressure Medicine: Do not forget to take your blood pressure medicine with a sip of water the morning of the procedure. If your Diastolic (lower reading)is above 100 mmHg, elective cases will be cancelled/rescheduled. . Blood thinners: These will need to be stopped for procedures. Notify our staff if you are taking any blood thinners. Depending on which one you take, there will be specific instructions on how and when to stop it. . Diabetics on insulin: Notify the staff so that you can be scheduled 1st case in the morning. If your diabetes requires high dose insulin, take only  of your normal insulin dose the morning of the procedure and notify the staff that you have done so. . Preventing infections: Shower with an antibacterial soap the morning of your procedure. . Build-up your immune system: Take 1000 mg of Vitamin C with every meal (3 times a day) the day prior to your procedure. . Antibiotics: Inform the staff if you have a condition or reason that requires you to take antibiotics before dental procedures. . Pregnancy: If you are pregnant, call and cancel the procedure. . Sickness: If you have a cold, fever, or any active infections, call and cancel the procedure. . Arrival: You must be in the facility at least 30 minutes prior to your scheduled procedure. . Children: Do not bring children with you. . Dress appropriately:  Bring dark clothing that you would not mind if they get stained. . Valuables: Do not bring any jewelry or valuables.  Reasons to call and reschedule or cancel your procedure: (Following these recommendations will minimize the risk of a serious complication.) . Surgeries: Avoid having procedures within 2 weeks of any surgery. (Avoid for 2 weeks before or after any surgery). . Flu Shots: Avoid having procedures within 2 weeks of a flu shots or . (Avoid for 2 weeks before or after immunizations). . Barium: Avoid having a procedure within 7-10 days after having had a radiological study involving the use of radiological contrast. (Myelograms, Barium swallow or enema study). . Heart attacks: Avoid any elective procedures or surgeries for the initial 6 months after a "Myocardial Infarction" (Heart Attack). . Blood thinners: It is imperative that you stop these medications before procedures. Let us know if you if you take any blood thinner.  . Infection: Avoid procedures during or within two weeks of an infection (including chest colds or gastrointestinal problems). Symptoms associated with infections include: Localized redness, fever, chills, night sweats or profuse sweating, burning sensation when voiding, cough, congestion, stuffiness, runny nose, sore throat, diarrhea, nausea, vomiting, cold or Flu symptoms, recent or current infections. It is specially important if the infection is over the area that we intend to treat. . Heart and lung problems: Symptoms that may suggest an active cardiopulmonary problem include: cough, chest pain, breathing difficulties or shortness of breath, dizziness, ankle swelling, uncontrolled high or unusually low blood pressure, and/or palpitations. If you are experiencing any of these symptoms, cancel your procedure and contact your primary care physician for an evaluation.  Remember:  Regular Business hours are:    Monday to Thursday 8:00 AM to 4:00 PM  Provider's  Schedule: Tallen Schnorr, MD:  Procedure days: Tuesday and Thursday 7:30 AM to 4:00 PM  Bilal Lateef, MD:  Procedure days: Monday and Wednesday 7:30 AM to 4:00 PM ____________________________________________________________________________________________   ____________________________________________________________________________________________  General Risks and Possible Complications  Patient Responsibilities: It is important that you read this as it is part of your informed consent. It is our duty to inform you of the risks and possible complications associated with treatments offered to you. It is your responsibility as a patient to read this and to ask questions about anything that is not clear or that you believe was not covered in this document.  Patient's Rights: You have the right to refuse treatment. You also have the right to change your mind, even after initially having agreed to have the treatment done. However, under this last option, if you wait until the last second to change your mind, you may be charged for the materials used up to that point.  Introduction: Medicine is not an exact science. Everything in Medicine, including the lack of treatment(s), carries the potential for danger, harm, or loss (which is by definition: Risk). In Medicine, a complication is a secondary problem, condition, or disease that can aggravate an already existing one. All treatments carry the risk of possible complications. The fact that a side effects or complications occurs, does not imply that the treatment was conducted incorrectly. It must be clearly understood that these can happen even when everything is done following the highest safety standards.  No treatment: You can choose not to proceed with the proposed treatment alternative. The "PRO(s)" would include: avoiding the risk of complications associated with the therapy. The "CON(s)" would include: not getting any of the treatment  benefits. These benefits fall under one of three categories: diagnostic; therapeutic; and/or palliative. Diagnostic benefits include: getting information which can ultimately lead to improvement of the disease or symptom(s). Therapeutic benefits are those associated with the successful treatment of the disease. Finally, palliative benefits are those related to the decrease of the primary symptoms, without necessarily curing the condition (example: decreasing the pain from a flare-up of a chronic condition, such as incurable terminal cancer).  General Risks and Complications: These are associated to most interventional treatments. They can occur alone, or in combination. They fall under one of the following six (6) categories: no benefit or worsening of symptoms; bleeding; infection; nerve damage; allergic reactions; and/or death. 1. No benefits or worsening of symptoms: In Medicine there are no guarantees, only probabilities. No healthcare provider can ever guarantee that a medical treatment will work, they can only state the probability that it may. Furthermore, there is always the possibility that the condition may worsen, either directly, or indirectly, as a consequence of the treatment. 2. Bleeding: This is more common if the patient is taking a blood thinner, either prescription or over the counter (example: Goody Powders, Fish oil, Aspirin, Garlic, etc.), or if suffering a condition associated with impaired coagulation (example: Hemophilia, cirrhosis of the liver, low platelet counts, etc.). However, even if you do not have one on these, it can still happen. If you have any of these conditions, or take one of these drugs, make sure to notify your treating physician. 3. Infection: This is more common in patients with a compromised immune system, either due to disease (example: diabetes, cancer, human immunodeficiency virus [HIV], etc.), or due to medications or treatments (example: therapies used to treat  cancer and   rheumatological diseases). However, even if you do not have one on these, it can still happen. If you have any of these conditions, or take one of these drugs, make sure to notify your treating physician. 4. Nerve Damage: This is more common when the treatment is an invasive one, but it can also happen with the use of medications, such as those used in the treatment of cancer. The damage can occur to small secondary nerves, or to large primary ones, such as those in the spinal cord and brain. This damage may be temporary or permanent and it may lead to impairments that can range from temporary numbness to permanent paralysis and/or brain death. 5. Allergic Reactions: Any time a substance or material comes in contact with our body, there is the possibility of an allergic reaction. These can range from a mild skin rash (contact dermatitis) to a severe systemic reaction (anaphylactic reaction), which can result in death. 6. Death: In general, any medical intervention can result in death, most of the time due to an unforeseen complication. ____________________________________________________________________________________________   

## 2020-07-05 ENCOUNTER — Other Ambulatory Visit: Payer: Self-pay

## 2020-07-05 ENCOUNTER — Ambulatory Visit
Admission: RE | Admit: 2020-07-05 | Discharge: 2020-07-05 | Disposition: A | Discharge status: Home / Self Care | Payer: Medicare Other | Attending: Pain Medicine | Admitting: Pain Medicine

## 2020-07-05 ENCOUNTER — Ambulatory Visit
Admission: RE | Admit: 2020-07-05 | Discharge: 2020-07-05 | Disposition: A | Discharge status: Home / Self Care | Payer: Medicare Other | Source: Ambulatory Visit | Attending: Pain Medicine | Admitting: Pain Medicine

## 2020-07-05 DIAGNOSIS — M47812 Spondylosis without myelopathy or radiculopathy, cervical region: Secondary | ICD-10-CM | POA: Diagnosis present

## 2020-07-05 DIAGNOSIS — Z981 Arthrodesis status: Secondary | ICD-10-CM | POA: Insufficient documentation

## 2020-07-05 DIAGNOSIS — M542 Cervicalgia: Secondary | ICD-10-CM | POA: Diagnosis not present

## 2020-07-05 DIAGNOSIS — M5030 Other cervical disc degeneration, unspecified cervical region: Secondary | ICD-10-CM | POA: Insufficient documentation

## 2020-07-05 DIAGNOSIS — G4486 Cervicogenic headache: Secondary | ICD-10-CM

## 2020-07-05 DIAGNOSIS — R519 Headache, unspecified: Secondary | ICD-10-CM

## 2020-07-05 DIAGNOSIS — G8929 Other chronic pain: Secondary | ICD-10-CM

## 2020-07-10 NOTE — Progress Notes (Signed)
PROVIDER NOTE: Information contained herein reflects review and annotations entered in association with encounter. Interpretation of such information and data should be left to medically-trained personnel. Information provided to patient can be located elsewhere in the medical record under "Patient Instructions". Document created using STT-dictation technology, any transcriptional errors that may result from process are unintentional.    Patient: Jacqueline Thornton  Service Category: Procedure  Provider: Gaspar Cola, MD  DOB: 12/21/1950  DOS: 07/11/2020  Location: [facility]  MRN: [id number]  Setting: Ambulatory - outpatient  Referring Provider: Perrin Maltese, MD  Type: Established Patient  Specialty: Interventional Pain Management  PCP: Perrin Maltese, MD   Primary Reason for Visit: Interventional Pain Management Treatment. CC: Back Pain  Procedure:          Anesthesia, Analgesia, Anxiolysis:  Type: Therapeutic/Diagnostic Epidural Steroid Injection #1  Region: Caudal Level: Sacrococcygeal   Laterality: Midline       Type: Moderate (Conscious) Sedation combined with Local Anesthesia Indication(s): Analgesia and Anxiety Route: Intravenous (IV) IV Access: Secured Sedation: Meaningful verbal contact was maintained at all times during the procedure  Local Anesthetic: Lidocaine 1-2%  Position: Prone   Indications: 1. DDD (degenerative disc disease), lumbar   2. Failed back surgical syndrome   3. Chronic lumbar radicular pain (Primary Area of Pain) (Left) (S1 Dermatome)   4. Chronic low back pain (Secondary area of Pain) (Bilateral) (midline to tailbone) (R>L)   5. 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 Lumbar annular disc tear (L4-5)   7. Spondylosis of lumbar spine    Pain Score: Pre-procedure: 5 /10 Post-procedure: 0-No pain/10   Pre-op H&P Assessment:  Jacqueline Thornton is a 70 y.o. (year old), female patient, seen today for interventional treatment. She   has a past surgical history that includes Cholecystectomy; Abdominal hysterectomy; Neck surgery; Shoulder surgery; Colonoscopy with propofol (N/A, 07/19/2015); caract surger; Laparoscopic salpingo oophorectomy (Bilateral, 03/03/2018); Cystoscopy (03/03/2018); Tonsillectomy; Appendectomy; Tumor removal; Anterior lumbar fusion (N/A, 12/11/2018); and Abdominal exposure (N/A, 12/11/2018). Jacqueline Thornton has a current medication list which includes the following prescription(s): nexletol, vitamin d3, vitamin b-12, cyclobenzaprine, duloxetine, enalapril, esomeprazole, estradiol, hemocyte-plus, furosemide, gabapentin, melatonin, meloxicam, methocarbamol, oxycodone-acetaminophen, [START ON 07/20/2020] oxycodone-acetaminophen, [START ON 08/19/2020] oxycodone-acetaminophen, rosuvastatin, and sertraline, and the following Facility-Administered Medications: fentanyl and midazolam. Her primarily concern today is the Back Pain  Initial Vital Signs:  Pulse/HCG Rate: 77ECG Heart Rate: 72 Temp: (!) 96.8 F (36 C) Resp: 18 BP: (!) 125/56 SpO2: 99 %  BMI: Estimated body mass index is 28.9 kg/m as calculated from the following:   Height as of this encounter: 5' (1.524 m).   Weight as of this encounter: 148 lb (67.1 kg).  Risk Assessment: Allergies: Reviewed. She has No Known Allergies.  Allergy Precautions: None required Coagulopathies: Reviewed. None identified.  Blood-thinner therapy: None at this time Active Infection(s): Reviewed. None identified. Jacqueline Thornton is afebrile  Site Confirmation: Jacqueline Thornton was asked to confirm the procedure and laterality before marking the site Procedure checklist: Completed Consent: Before the procedure and under the influence of no sedative(s), amnesic(s), or anxiolytics, the patient was informed of the treatment options, risks and possible complications. To fulfill our ethical and legal obligations, as recommended by the American Medical Association's Code of Ethics, I have informed the  patient of my clinical impression; the nature and purpose of the treatment or procedure; the risks, benefits, and possible complications of the intervention; the alternatives, including doing nothing; the risk(s) and  benefit(s) of the alternative treatment(s) or procedure(s); and the risk(s) and benefit(s) of doing nothing. The patient was provided information about the general risks and possible complications associated with the procedure. These may include, but are not limited to: failure to achieve desired goals, infection, bleeding, organ or nerve damage, allergic reactions, paralysis, and death. In addition, the patient was informed of those risks and complications associated to Spine-related procedures, such as failure to decrease pain; infection (i.e.: Meningitis, epidural or intraspinal abscess); bleeding (i.e.: epidural hematoma, subarachnoid hemorrhage, or any other type of intraspinal or peri-dural bleeding); organ or nerve damage (i.e.: Any type of peripheral nerve, nerve root, or spinal cord injury) with subsequent damage to sensory, motor, and/or autonomic systems, resulting in permanent pain, numbness, and/or weakness of one or several areas of the body; allergic reactions; (i.e.: anaphylactic reaction); and/or death. Furthermore, the patient was informed of those risks and complications associated with the medications. These include, but are not limited to: allergic reactions (i.e.: anaphylactic or anaphylactoid reaction(s)); adrenal axis suppression; blood sugar elevation that in diabetics may result in ketoacidosis or comma; water retention that in patients with history of congestive heart failure may result in shortness of breath, pulmonary edema, and decompensation with resultant heart failure; weight gain; swelling or edema; medication-induced neural toxicity; particulate matter embolism and blood vessel occlusion with resultant organ, and/or nervous system infarction; and/or aseptic necrosis  of one or more joints. Finally, the patient was informed that Medicine is not an exact science; therefore, there is also the possibility of unforeseen or unpredictable risks and/or possible complications that may result in a catastrophic outcome. The patient indicated having understood very clearly. We have given the patient no guarantees and we have made no promises. Enough time was given to the patient to ask questions, all of which were answered to the patient's satisfaction. Ms. Paluck has indicated that she wanted to continue with the procedure. Attestation: I, the ordering provider, attest that I have discussed with the patient the benefits, risks, side-effects, alternatives, likelihood of achieving goals, and potential problems during recovery for the procedure that I have provided informed consent. Date   Time: 07/11/2020  9:02 AM  Pre-Procedure Preparation:  Monitoring: As per clinic protocol. Respiration, ETCO2, SpO2, BP, heart rate and rhythm monitor placed and checked for adequate function Safety Precautions: Patient was assessed for positional comfort and pressure points before starting the procedure. Time-out: I initiated and conducted the "Time-out" before starting the procedure, as per protocol. The patient was asked to participate by confirming the accuracy of the "Time Out" information. Verification of the correct person, site, and procedure were performed and confirmed by me, the nursing staff, and the patient. "Time-out" conducted as per Joint Commission's Universal Protocol (UP.01.01.01). Time: 0956  Description of Procedure:          Target Area: Caudal Epidural Canal. Approach: Midline approach. Area Prepped: Entire Posterior Sacrococcygeal Region DuraPrep (Iodine Povacrylex [0.7% available iodine] and Isopropyl Alcohol, 74% w/w) Safety Precautions: Aspiration looking for blood return was conducted prior to all injections. At no point did we inject any substances, as a needle  was being advanced. No attempts were made at seeking any paresthesias. Safe injection practices and needle disposal techniques used. Medications properly checked for expiration dates. SDV (single dose vial) medications used. Description of the Procedure: Protocol guidelines were followed. The patient was placed in position over the fluoroscopy table. The target area was identified and the area prepped in the usual manner. Skin & deeper tissues  infiltrated with local anesthetic. Appropriate amount of time allowed to pass for local anesthetics to take effect. The procedure needles were then advanced to the target area. Proper needle placement secured. Negative aspiration confirmed. Solution injected in intermittent fashion, asking for systemic symptoms every 0.5cc of injectate. The needles were then removed and the area cleansed, making sure to leave some of the prepping solution back to take advantage of its long term bactericidal properties. Vitals:   07/11/20 0959 07/11/20 1003 07/11/20 1013 07/11/20 1023  BP: 123/63 118/89 102/70 110/70  Pulse:      Resp: 15 11 19 14   Temp:   (!) 96.9 F (36.1 C)   SpO2: 98% 100% 98% 100%  Weight:      Height:        Start Time: 0956 hrs. End Time: 1002 hrs. Materials:  Needle(s) Type: Epidural needle Gauge: 17G Length: 3.5-in Medication(s): Please see orders for medications and dosing details.  Imaging Guidance (Spinal):          Type of Imaging Technique: Fluoroscopy Guidance (Spinal) Indication(s): Assistance in needle guidance and placement for procedures requiring needle placement in or near specific anatomical locations not easily accessible without such assistance. Exposure Time: Please see nurses notes. Contrast: Before injecting any contrast, we confirmed that the patient did not have an allergy to iodine, shellfish, or radiological contrast. Once satisfactory needle placement was completed at the desired level, radiological contrast was injected.  Contrast injected under live fluoroscopy. No contrast complications. See chart for type and volume of contrast used. Fluoroscopic Guidance: I was personally present during the use of fluoroscopy. "Tunnel Vision Technique" used to obtain the best possible view of the target area. Parallax error corrected before commencing the procedure. "Direction-depth-direction" technique used to introduce the needle under continuous pulsed fluoroscopy. Once target was reached, antero-posterior, oblique, and lateral fluoroscopic projection used confirm needle placement in all planes. Images permanently stored in EMR.        Interpretation: I personally interpreted the imaging intraoperatively. Adequate needle placement confirmed in multiple planes. Appropriate spread of contrast into desired area was observed. No evidence of afferent or efferent intravascular uptake. No intrathecal or subarachnoid spread observed. Permanent images saved into the patient's record.  Antibiotic Prophylaxis:   Anti-infectives (From admission, onward)   None     Indication(s): None identified  Post-operative Assessment:  Post-procedure Vital Signs:  Pulse/HCG Rate: 7770 Temp: (!) 96.9 F (36.1 C) Resp: 14 BP: 110/70 SpO2: 100 %  EBL: None  Complications: No immediate post-treatment complications observed by team, or reported by patient.  Note: The patient tolerated the entire procedure well. A repeat set of vitals were taken after the procedure and the patient was kept under observation following institutional policy, for this type of procedure. Post-procedural neurological assessment was performed, showing return to baseline, prior to discharge. The patient was provided with post-procedure discharge instructions, including a section on how to identify potential problems. Should any problems arise concerning this procedure, the patient was given instructions to immediately contact us, at any time, without hesitation. In any  case, we plan to contact the patient by telephone for a follow-up status report regarding this interventional procedure.  Comments:  No additional relevant information.  Plan of Care  Orders:  Orders Placed This Encounter  Procedures   Caudal Epidural Injection    Scheduling Instructions:     Laterality: Midline     Level(s): Sacrococcygeal canal (Tailbone area)     Sedation: Patient's choice  Timeframe: Today    Order Specific Question:   Where will this procedure be performed?    Answer:   [facility]   DG PAIN [facility] C-ARM 1-60 MIN NO REPORT    Intraoperative interpretation by procedural physician at [place].    Standing Status:   Standing    Number of Occurrences:   1    Order Specific Question:   Reason for exam:    Answer:   Assistance in needle guidance and placement for procedures requiring needle placement in or near specific anatomical locations not easily accessible without such assistance.   Informed Consent Details: Physician/Practitioner Attestation; Transcribe to consent form and obtain patient signature    Nursing Order: Transcribe to consent form and obtain patient signature. Note: Always confirm laterality of pain with Ms. Guizar, before procedure.    Order Specific Question:   Physician/Practitioner attestation of informed consent for procedure/surgical case    Answer:   I, the physician/practitioner, attest that I have discussed with the patient the benefits, risks, side effects, alternatives, likelihood of achieving goals and potential problems during recovery for the procedure that I have provided informed consent.    Order Specific Question:   Procedure    Answer:   Caudal epidural steroid injection    Order Specific Question:   Physician/Practitioner performing the procedure    Answer:   Alissah Redmon A. Dossie Arbour, MD    Order Specific Question:   Indication/Reason    Answer:   Low back pain and lower extremity pain secondary to  lumbosacral radiculitis   Provide equipment / supplies at bedside    "Epidural Tray" (Disposable   single use) Catheter: NOT required    Standing Status:   Standing    Number of Occurrences:   1    Order Specific Question:   Specify    Answer:   Epidural Tray   Chronic Opioid Analgesic:  Oxycodone/APAP 7.5/325, 1 tab PO q 5 times a day. (56.25 mg/day of oxycodone) MME/day:56.25 mg/day.   Medications ordered for procedure: Meds ordered this encounter  Medications   iohexol (OMNIPAQUE) 180 MG/ML injection 10 mL    Must be Myelogram-compatible. If not available, you may substitute with a water-soluble, non-ionic, hypoallergenic, myelogram-compatible radiological contrast medium.   lidocaine (XYLOCAINE) 2 % (with pres) injection 400 mg   lactated ringers infusion 1,000 mL   midazolam (VERSED) 5 MG/5ML injection 1-2 mg    Make sure Flumazenil is available in the pyxis when using this medication. If oversedation occurs, administer 0.2 mg IV over 15 sec. If after 45 sec no response, administer 0.2 mg again over 1 min; may repeat at 1 min intervals; not to exceed 4 doses (1 mg)   fentaNYL (SUBLIMAZE) injection 25-50 mcg    Make sure Narcan is available in the pyxis when using this medication. In the event of respiratory depression (RR< 8/min): Titrate NARCAN (naloxone) in increments of 0.1 to 0.2 mg IV at 2-3 minute intervals, until desired degree of reversal.   sodium chloride flush (NS) 0.9 % injection 2 mL   ropivacaine (PF) 2 mg/mL (0.2%) (NAROPIN) injection 2 mL   triamcinolone acetonide (KENALOG-40) injection 40 mg   Medications administered: We administered iohexol, lidocaine, lactated ringers, midazolam, fentaNYL, sodium chloride flush, ropivacaine (PF) 2 mg/mL (0.2%), and triamcinolone acetonide.  See the medical record for exact dosing, route, and time of administration.  Follow-up plan:   Return in about 2 weeks (around 07/25/2020) for (F2F), (PP) Follow-up.  Interventional Therapies  Risk   Complexity Considerations:   WNL   Planned   Pending:   Pending further evaluation   Under consideration:   Diagnostic caudal diagnostic epidurogram Diagnostic CESI Diagnostic bilateral cervical facet block Possible bilateral cervical facet RFA  Diagnostic left L4-5 LESI  Diagnostic left S1 SNRB   Completed:   Diagnostic caudal ESI x1 (07/11/2020) Palliative left L5-S1 LESI x2 (07/27/2015)  Diagnostic left lumbar facet MBB x6 (05/14/2018)  Palliative right lumbar facet MBB x9 (06/13/2020)  Therapeutic right shoulder joint injection x1 (02/29/2016)  Therapeutic left IA hip injection x3 (06/13/2020)  Diagnostic right SI joint block x3 (06/17/2017)  Diagnostic/therapeutic left SI joint block x4 (06/17/2017)  Palliative bilateral trochanteric bursa injection x1 (12/11/2016)  Therapeutic right lumbar facet MB RFA x1 (08/07/2017)  Therapeutic left lumbar facet MB RFA x1 (09/16/2017)  Therapeutic right SI joint RFA x1 (08/07/2017)  Therapeutic left SI joint RFA x1 (09/16/2017)    Therapeutic   Palliative (PRN) options:   Palliativebilateral lumbar facet block  Palliativebilateral SI joint block Palliativeright trochanteric bursa injection    Recent Visits Date Type Provider Dept  06/28/20 Telemedicine Milinda Pointer, MD [facility]  06/13/20 Procedure visit Milinda Pointer, MD [facility]  05/24/20 Telemedicine Milinda Pointer, MD [facility]  Showing recent visits within past 90 days and meeting all other requirements Today's Visits Date Type Provider Dept  07/11/20 Procedure visit Milinda Pointer, MD [facility]  Showing today's visits and meeting all other requirements Future Appointments Date Type Provider Dept  07/26/20 Appointment Milinda Pointer, MD [facility]  09/13/20 Appointment Milinda Pointer, MD [facility]  Showing future appointments within next 90  days and meeting all other requirements  Disposition: Discharge home  Discharge (Date   Time): 07/11/2020; 1030 hrs.   Primary Care Physician: Perrin Maltese, MD Location: [facility] Note by: Gaspar Cola, MD Date: 07/11/2020; Time: 11:40 AM  Disclaimer:  Medicine is not an Chief Strategy Officer. The only guarantee in medicine is that nothing is guaranteed. It is important to note that the decision to proceed with this intervention was based on the information collected from the patient. The Data and conclusions were drawn from the patient's questionnaire, the interview, and the physical examination. Because the information was provided in large part by the patient, it cannot be guaranteed that it has not been purposely or unconsciously manipulated. Every effort has been made to obtain as much relevant data as possible for this evaluation. It is important to note that the conclusions that lead to this procedure are derived in large part from the available data. Always take into account that the treatment will also be dependent on availability of resources and existing treatment guidelines, considered by other Pain Management Practitioners as being common knowledge and practice, at the time of the intervention. For Medico-Legal purposes, it is also important to point out that variation in procedural techniques and pharmacological choices are the acceptable norm. The indications, contraindications, technique, and results of the above procedure should only be interpreted and judged by a Board-Certified Interventional Pain Specialist with extensive familiarity and expertise in the same exact procedure and technique.

## 2020-07-10 NOTE — Patient Instructions (Incomplete)

## 2020-07-11 ENCOUNTER — Ambulatory Visit
Admission: RE | Admit: 2020-07-11 | Discharge: 2020-07-11 | Disposition: A | Discharge status: Home / Self Care | Payer: Medicare Other | Source: Ambulatory Visit | Attending: Pain Medicine | Admitting: Pain Medicine

## 2020-07-11 ENCOUNTER — Ambulatory Visit (HOSPITAL_BASED_OUTPATIENT_CLINIC_OR_DEPARTMENT_OTHER): Payer: Medicare Other | Admitting: Pain Medicine

## 2020-07-11 ENCOUNTER — Encounter: Payer: Self-pay | Admitting: Pain Medicine

## 2020-07-11 ENCOUNTER — Other Ambulatory Visit: Payer: Self-pay

## 2020-07-11 VITALS — BP 110/70 | HR 77 | Temp 96.9°F | Resp 14 | Ht 60.0 in | Wt 148.0 lb

## 2020-07-11 DIAGNOSIS — M5136 Other intervertebral disc degeneration, lumbar region with discogenic back pain only: Secondary | ICD-10-CM

## 2020-07-11 DIAGNOSIS — M5416 Radiculopathy, lumbar region: Secondary | ICD-10-CM | POA: Insufficient documentation

## 2020-07-11 DIAGNOSIS — M51369 Other intervertebral disc degeneration, lumbar region without mention of lumbar back pain or lower extremity pain: Secondary | ICD-10-CM

## 2020-07-11 DIAGNOSIS — M47816 Spondylosis without myelopathy or radiculopathy, lumbar region: Secondary | ICD-10-CM

## 2020-07-11 DIAGNOSIS — M961 Postlaminectomy syndrome, not elsewhere classified: Secondary | ICD-10-CM | POA: Diagnosis present

## 2020-07-11 DIAGNOSIS — G8929 Other chronic pain: Secondary | ICD-10-CM | POA: Insufficient documentation

## 2020-07-11 DIAGNOSIS — M545 Low back pain, unspecified: Secondary | ICD-10-CM | POA: Diagnosis present

## 2020-07-11 MED ORDER — IOHEXOL 180 MG/ML  SOLN
INTRAMUSCULAR | Status: AC
  Filled 2020-07-11: qty 20

## 2020-07-11 MED ORDER — MIDAZOLAM HCL 5 MG/5ML IJ SOLN
INTRAMUSCULAR | Status: AC
  Filled 2020-07-11: qty 5

## 2020-07-11 MED ORDER — ROPIVACAINE HCL 2 MG/ML IJ SOLN
2.0000 mL | Freq: Once | INTRAMUSCULAR | Status: AC
  Administered 2020-07-11: 2 mL via EPIDURAL

## 2020-07-11 MED ORDER — MIDAZOLAM HCL 5 MG/5ML IJ SOLN
1.0000 mg | INTRAMUSCULAR | Status: DC | PRN
  Administered 2020-07-11: 1 mg via INTRAVENOUS

## 2020-07-11 MED ORDER — TRIAMCINOLONE ACETONIDE 40 MG/ML IJ SUSP
INTRAMUSCULAR | Status: AC
  Filled 2020-07-11: qty 1

## 2020-07-11 MED ORDER — SODIUM CHLORIDE (PF) 0.9 % IJ SOLN
INTRAMUSCULAR | Status: AC
  Filled 2020-07-11: qty 10

## 2020-07-11 MED ORDER — FENTANYL CITRATE (PF) 100 MCG/2ML IJ SOLN
25.0000 ug | INTRAMUSCULAR | Status: DC | PRN
  Administered 2020-07-11: 50 ug via INTRAVENOUS

## 2020-07-11 MED ORDER — LIDOCAINE HCL 2 % IJ SOLN
20.0000 mL | Freq: Once | INTRAMUSCULAR | Status: AC
  Administered 2020-07-11: 400 mg
  Filled 2020-07-11: qty 20

## 2020-07-11 MED ORDER — TRIAMCINOLONE ACETONIDE 40 MG/ML IJ SUSP
40.0000 mg | Freq: Once | INTRAMUSCULAR | Status: AC
  Administered 2020-07-11: 40 mg

## 2020-07-11 MED ORDER — SODIUM CHLORIDE 0.9% FLUSH
2.0000 mL | Freq: Once | INTRAVENOUS | Status: AC
  Administered 2020-07-11: 2 mL

## 2020-07-11 MED ORDER — ROPIVACAINE HCL 2 MG/ML IJ SOLN
INTRAMUSCULAR | Status: AC
  Filled 2020-07-11: qty 10

## 2020-07-11 MED ORDER — IOHEXOL 180 MG/ML  SOLN
10.0000 mL | Freq: Once | INTRAMUSCULAR | Status: AC
  Administered 2020-07-11: 10 mL via EPIDURAL

## 2020-07-11 MED ORDER — LIDOCAINE HCL (PF) 2 % IJ SOLN
INTRAMUSCULAR | Status: AC
  Filled 2020-07-11: qty 10

## 2020-07-11 MED ORDER — FENTANYL CITRATE (PF) 100 MCG/2ML IJ SOLN
INTRAMUSCULAR | Status: AC
  Filled 2020-07-11: qty 2

## 2020-07-11 MED ORDER — LACTATED RINGERS IV SOLN
1000.0000 mL | Freq: Once | INTRAVENOUS | Status: AC
  Administered 2020-07-11: 1000 mL via INTRAVENOUS

## 2020-07-11 NOTE — Progress Notes (Signed)
Safety precautions to be maintained throughout the outpatient stay will include: orient to surroundings, keep bed in low position, maintain call bell within reach at all times, provide assistance with transfer out of bed and ambulation.  

## 2020-07-12 ENCOUNTER — Telehealth: Payer: Self-pay

## 2020-07-12 NOTE — Telephone Encounter (Signed)
Called patient, denies any needs at this time. She states she is feeling [place].  Instructed to call if needed.

## 2020-07-24 NOTE — Progress Notes (Deleted)
No showed

## 2020-07-26 ENCOUNTER — Ambulatory Visit: Payer: Medicare Other | Admitting: Pain Medicine

## 2020-07-26 DIAGNOSIS — F1120 Opioid dependence, uncomplicated: Secondary | ICD-10-CM

## 2020-07-26 DIAGNOSIS — Z79899 Other long term (current) drug therapy: Secondary | ICD-10-CM

## 2020-07-26 DIAGNOSIS — G894 Chronic pain syndrome: Secondary | ICD-10-CM

## 2020-07-26 DIAGNOSIS — M961 Postlaminectomy syndrome, not elsewhere classified: Secondary | ICD-10-CM

## 2020-07-26 DIAGNOSIS — G8929 Other chronic pain: Secondary | ICD-10-CM

## 2020-07-26 DIAGNOSIS — M25551 Pain in right hip: Secondary | ICD-10-CM

## 2020-08-07 NOTE — Progress Notes (Signed)
PROVIDER NOTE: Information contained herein reflects review and annotations entered in association with encounter. Interpretation of such information and data should be left to medically-trained personnel. Information provided to patient can be located elsewhere in the medical record under "Patient Instructions". Document created using STT-dictation technology, any transcriptional errors that may result from process are unintentional.    Patient: Jacqueline Thornton  Service Category: E/M  Provider: Gaspar Cola, MD  DOB: 1951/06/17  DOS: 08/09/2020  Specialty: Interventional Pain Management  MRN: [id number]  Setting: Ambulatory outpatient  PCP: Perrin Maltese, MD  Type: Established Patient    Referring Provider: Perrin Maltese, MD  Location: Office  Delivery: Face-to-face     HPI  Jacqueline Thornton, a 70 y.o. year old female, is here today because of her Chronic pain syndrome [G89.4]. Jacqueline Thornton's primary complain today is Back Pain Last encounter: My last encounter with her was on 07/26/2020. Pertinent problems: Jacqueline Thornton has Chronic pain syndrome; Headache, migraine; Chronic low back pain (Secondary area of Pain) (Bilateral) (midline to tailbone) (R>L); Spondylosis of lumbar spine; Lumbar annular disc tear (L4-5); Discogenic low back pain (L3-4 and L4-5); Lumbar facet hypertrophy; Lumbar facet syndrome (Bilateral) (R>L); Chronic neck pain (Left); Cervical spondylosis; Hx of cervical spine surgery; Cervical spinal fusion (C6-7 interbody fusion); Chronic sacroiliac joint pain (Bilateral) (L>R); Chronic lumbar radicular pain (Primary Area of Pain) (Left) (S1 Dermatome); Trochanteric bursitis of right hip; Chronic hip pain (Bilateral) (L>R); Neurogenic pain; Myofascial pain; Osteoarthritis of hip (Left); Arthrodesis status; DDD (degenerative disc disease), lumbar; Myalgia; Trochanteric bursitis of hip (Bilateral) (L>R); Spondylosis without myelopathy or radiculopathy, lumbosacral region; Spondylosis  without myelopathy or radiculopathy, sacral and sacrococcygeal region; Other specified dorsopathies, sacral and sacrococcygeal region; Age-related osteoporosis without current pathological fracture; Bone island of right femur; Inflammatory spondylopathy of lumbar region [facility]); Trigger point with back pain (Right); Fibromyalgia; DDD (degenerative disc disease), cervical; Spondylolisthesis of lumbar region; History of lumbar spinal fusion (Dr. Erline Levine) (12/11/2018); Failed back surgical syndrome; Unilateral occipital headache (Left); and Cervicogenic headache (Left) on their pertinent problem list. Pain Assessment: Severity of Chronic pain is reported as a 4 /10. Location: Back Lower/Denies. Onset: More than a month ago. Quality: Aching,Constant,Burning,Throbbing,Pressure. Timing: Constant. Modifying factor(s): meds, heat and ice. Vitals:  height is 5' (1.524 m) and weight is 149 lb (67.6 kg). Her temperature is 97.1 F (36.2 C) (abnormal). Her blood pressure is 119/87 and her pulse is 89. Her oxygen saturation is 100%.   Reason for encounter: post-procedure assessment.  According to the patient, she did not get as much relief as with the other injections.  However, the epidurogram portion of the injection shows a filling defect of the nerve roots on the right side.  Today the patient confirms that she did obtain much better relief of the pain on the left side, when compared to the right.  Because of the limitation in the flow of contrast, this would suggest that there is some epidural fibrosis in the area.  Today I have offered the patient a Racz procedure.  I have explained to her what it is and would consist of and I have written the name of the procedure down so that she can "google it".  Today the patient indicated that she recently completed an MRI of the cervical spine which apparently was done in [place] at the [place] [facility].  Unfortunately, neither the images nor the report is  available.  However, the patient does have an image in her  telephone that she showed it to me and there is clear evidence of cervical spinal stenosis.  She was told that she would need some surgery for that.  She is having problems were when she flexes her cervical spine and then goes on to extend that afterwards, she refers that that when she gets the sharp pains.  I have recommended that she follow-up with Dr. Vertell Limber, her neurosurgeon, and perhaps have that area decompressed.  I asked her if she had an appointment for the surgery but she indicated that her husband recently fell and broke some ribs and she is taking care of him and she wants at least one of the 2 to be capable of doing some housework.  For this reason, she is holding on that surgery.  However, she does realize that she needs it done.  Today I have offered her the Racz procedure and I asked her if it would be okay for her to have it done now.  She indicated that since that was a same day procedure, she would be more than happy to have something done as soon as possible.  Post-Procedure Evaluation  Procedure (07/26/2020): Therapeutic/palliative midline caudal ESI #1 under fluoroscopic guidance and IV sedation Pre-procedure pain level: 5/10 Post-procedure: 0/10 (100% relief)  Sedation: Sedation provided.  Effectiveness during initial hour after procedure(Ultra-Short Term Relief): 100 %.  Local anesthetic used: Long-acting (4-6 hours) Effectiveness: Defined as any analgesic benefit obtained secondary to the administration of local anesthetics. This carries significant diagnostic value as to the etiological location, or anatomical origin, of the pain. Duration of benefit is expected to coincide with the duration of the local anesthetic used.  Effectiveness during initial 4-6 hours after procedure(Short-Term Relief): 100 %.  Long-term benefit: Defined as any relief past the pharmacologic duration of the local anesthetics.  Effectiveness  past the initial 6 hours after procedure(Long-Term Relief): 0 %.  Current benefits: Defined as benefit that persist at this time.   Analgesia:  Back to baseline.  However she does feel that on the left side she attained some benefit compared to the right. Function: Back to baseline ROM: Back to baseline  Pharmacotherapy Assessment   Analgesic: Oxycodone/APAP 7.5/325, 1 tab PO q 5 times a day. (56.25 mg/day of oxycodone) MME/day:56.25 mg/day.   Monitoring: [place] PMP: PDMP reviewed during this encounter.       Pharmacotherapy: No side-effects or adverse reactions reported. Compliance: No problems identified. Effectiveness: Clinically acceptable.  Chauncey Fischer, RN  08/09/2020  9:33 AM  Sign when Signing Visit Safety precautions to be maintained throughout the outpatient stay will include: orient to surroundings, keep bed in low position, maintain call bell within reach at all times, provide assistance with transfer out of bed and ambulation.     UDS:  Summary  Date Value Ref Range Status  12/16/2019 Note  Final    Comment:    ==================================================================== [employer] [facility] (MW) ==================================================================== Test                             Result       Flag       Units  Drug Present and Declared for Prescription Verification   Oxycodone                      3206         EXPECTED   ng/mg creat   Oxymorphone  1043         EXPECTED   ng/mg creat   Noroxycodone                   3386         EXPECTED   ng/mg creat   Noroxymorphone                 175          EXPECTED   ng/mg creat    Sources of oxycodone are scheduled prescription medications.    Oxymorphone, noroxycodone, and noroxymorphone are expected    metabolites of oxycodone. Oxymorphone is also available as a    scheduled prescription medication.  ==================================================================== Test                       Result    Flag   Units      Ref Range   Creatinine              95               mg/dL      >=20 ==================================================================== Declared Medications:  The flagging and interpretation on this report are based on the  following declared medications.  Unexpected results may arise from  inaccuracies in the declared medications.   **Note: The testing scope of this panel includes these medications:   Oxycodone (Percocet)   **Note: The testing scope of this panel does not include the  following reported medications:   Acetaminophen (Percocet)  Cyclobenzaprine (Flexeril)  Duloxetine (Cymbalta)  Enalapril (Vasotec)  Esomeprazole (Nexium)  Estradiol (Estrace)  Gabapentin (Neurontin)  Hydrochlorothiazide (Hydrodiuril)  Melatonin  Rivaroxaban (Xarelto)  Rosuvastatin (Crestor)  Sertraline (Zoloft)  Vitamin B12  Vitamin D3 ==================================================================== For clinical consultation, please call [phone]. ====================================================================      ROS  Constitutional: Denies any fever or chills Gastrointestinal: No reported hemesis, hematochezia, vomiting, or acute GI distress Musculoskeletal: Denies any acute onset joint swelling, redness, loss of ROM, or weakness Neurological: No reported episodes of acute onset apraxia, aphasia, dysarthria, agnosia, amnesia, paralysis, loss of coordination, or loss of consciousness  Medication Review  Bempedoic Acid, DULoxetine, Hemocyte-Plus, Vitamin B-12, Vitamin D3, cyclobenzaprine, enalapril, esomeprazole, estradiol, furosemide, gabapentin, melatonin, meloxicam, methocarbamol, oxyCODONE-acetaminophen, rosuvastatin, and sertraline  History Review  Allergy: Jacqueline Thornton has No Known Allergies. Drug: Jacqueline Thornton  reports no history of drug use. Alcohol:  reports no history of alcohol use. Tobacco:  reports that she quit  smoking about 17 years ago. She has never used smokeless tobacco. Social: Jacqueline Thornton  reports that she quit smoking about 17 years ago. She has never used smokeless tobacco. She reports that she does not drink alcohol and does not use drugs. Medical:  has a past medical history of Absolute anemia (04/11/2015), Acute postoperative pain (08/07/2017), Angina pectoris ([place]), Anxiety, Arthritis, Arthropathy of sacroiliac joint (04/11/2015), Atypical face pain (04/24/2015), Back pain, CAD (coronary artery disease), Cancer ([place]), Chronic back pain, Depression, Fibromyalgia, GERD (gastroesophageal reflux disease), 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) (04/11/2015), H/O: hysterectomy (1979), Heart murmur, Hyperlipidemia, Hypertension, Low back pain (04/06/2015), Lumbar radicular pain (04/18/2015), Migraine, Narrowing of intervertebral disc space (04/11/2015), Sacroiliac joint pain (04/11/2015), and Spine disorder. Surgical: Jacqueline Thornton  has a past surgical history that includes Cholecystectomy; Abdominal hysterectomy; Neck surgery; Shoulder surgery; Colonoscopy with propofol (N/A, 07/19/2015); caract surger; Laparoscopic salpingo oophorectomy (Bilateral, 03/03/2018); Cystoscopy (03/03/2018); Tonsillectomy; Appendectomy; Tumor removal; Anterior lumbar fusion (N/A, 12/11/2018); and Abdominal exposure (N/A, 12/11/2018).  Family: family history includes Colon cancer in her maternal grandmother and mother; Diabetes in her father and mother; Hypertension in her sister; Stroke in her mother.  Laboratory Chemistry Profile   Renal Lab Results  Component Value Date   BUN 11 12/16/2019   CREATININE 1.35 (H) 12/16/2019   BCR 8 (L) 12/16/2019   GFRAA 47 (L) 12/16/2019   GFRNONAA 41 (L) 12/16/2019     Hepatic Lab Results  Component Value Date   AST 15 12/16/2019   ALT 14 03/08/2018   ALBUMIN 4.0 12/16/2019   ALKPHOS 60 12/16/2019     Electrolytes Lab Results  Component Value Date   NA 140 12/16/2019   K 4.5  12/16/2019   CL 100 12/16/2019   CALCIUM 8.8 12/16/2019   MG 2.4 (H) 12/16/2019     Bone Lab Results  Component Value Date   25OHVITD1 53 12/16/2019   25OHVITD2 <1.0 12/16/2019   25OHVITD3 53 12/16/2019     Inflammation (CRP: Acute Phase) (ESR: Chronic Phase) Lab Results  Component Value Date   CRP 4 12/16/2019   ESRSEDRATE 6 12/16/2019       Note: Above Lab results reviewed.  Recent Imaging Review  DG PAIN CLINIC C-ARM 1-60 MIN NO REPORT Fluoro was used, but no Radiologist interpretation will be provided.  Please refer to "NOTES" tab for provider progress note. Note: Reviewed        Physical Exam  General appearance: Well nourished, well developed, and well hydrated. In no apparent acute distress Mental status: Alert, oriented x 3 (person, place, & time)       Respiratory: No evidence of acute respiratory distress Eyes: PERLA Vitals: BP 119/87   Pulse 89   Temp (!) 97.1 F (36.2 C)   Ht 5' (1.524 m)   Wt 149 lb (67.6 kg)   SpO2 100%   BMI 29.10 kg/m  BMI: Estimated body mass index is 29.1 kg/m as calculated from the following:   Height as of this encounter: 5' (1.524 m).   Weight as of this encounter: 149 lb (67.6 kg). Ideal: Ideal body weight: 45.5 kg (100 lb 4.9 oz) Adjusted ideal body weight: 54.3 kg (119 lb 12.6 oz)  Assessment   Status Diagnosis  Controlled Controlled Controlled 1. Chronic pain syndrome   2. Failed back surgical syndrome   3. Chronic lumbar radicular pain (Primary Area of Pain) (Left) (S1 Dermatome)   4. Chronic low back pain (Secondary area of Pain) (Bilateral) (midline to tailbone) (R>L)   5. Discogenic low back pain (L3-4 and L4-5)      Updated Problems: No problems updated.  Plan of Care  Problem-specific:  No problem-specific Assessment & Plan notes found for this encounter.  Jacqueline Thornton has a current medication list which includes the following long-term medication(s): vitamin b-12, cyclobenzaprine, duloxetine,  enalapril, esomeprazole, furosemide, gabapentin, oxycodone-acetaminophen, oxycodone-acetaminophen, [START ON 08/19/2020] oxycodone-acetaminophen, and sertraline.  Pharmacotherapy (Medications Ordered): No orders of the defined types were placed in this encounter.  Orders:  Orders Placed This Encounter  Procedures  . Racz Epidurolysis    Standing Status:   Future    Standing Expiration Date:   08/09/2021    Scheduling Instructions:     Procedure: RACZ Epidural Lysis of Adhesions     Side: Midline to right     Sedation: Moderate Conscious Sedation     Timeframe: ASAA    Order Specific Question:   Where will this procedure be performed?    Answer:  [facility]   Follow-up plan:   Return for Procedure (w/ sedation): (R) Racz Procedure #1.      Interventional Therapies  Risk  Complexity Considerations:   WNL   Planned  Pending:   Pending further evaluation   Under consideration:   Diagnostic caudal diagnostic epidurogram Diagnostic CESI Diagnostic bilateral cervical facet block Possible bilateral cervical facet RFA  Diagnostic left L4-5 LESI  Diagnostic left S1 SNRB   Completed:   Diagnostic caudal ESI x1 (07/11/2020) Palliative left L5-S1 LESI x2 (07/27/2015)  Diagnostic left lumbar facet MBB x6 (05/14/2018)  Palliative right lumbar facet MBB x9 (06/13/2020)  Therapeutic right shoulder joint injection x1 (02/29/2016)  Therapeutic left IA hip injection x3 (06/13/2020)  Diagnostic right SI joint block x3 (06/17/2017)  Diagnostic/therapeutic left SI joint block x4 (06/17/2017)  Palliative bilateral trochanteric bursa injection x1 (12/11/2016)  Therapeutic right lumbar facet MB RFA x1 (08/07/2017)  Therapeutic left lumbar facet MB RFA x1 (09/16/2017)  Therapeutic right SI joint RFA x1 (08/07/2017)  Therapeutic left SI joint RFA x1 (09/16/2017)    Therapeutic  Palliative (PRN) options:   Palliativebilateral lumbar facet block  Palliativebilateral SI joint  block Palliativeright trochanteric bursa injection     Recent Visits Date Type Provider Dept  07/11/20 Procedure visit Milinda Pointer, MD [facility]  06/28/20 Telemedicine Milinda Pointer, MD [facility]  06/13/20 Procedure visit Milinda Pointer, MD [facility]  05/24/20 Telemedicine Milinda Pointer, MD [facility]  Showing recent visits within past 90 days and meeting all other requirements Today's Visits Date Type Provider Dept  08/09/20 Office Visit Milinda Pointer, MD [facility]  Showing today's visits and meeting all other requirements Future Appointments Date Type Provider Dept  08/17/20 Appointment Milinda Pointer, MD [facility]  09/13/20 Appointment Milinda Pointer, MD [facility]  Showing future appointments within next 90 days and meeting all other requirements  I discussed the assessment and treatment plan with the patient. The patient was provided an opportunity to ask questions and all were answered. The patient agreed with the plan and demonstrated an understanding of the instructions.  Patient advised to call back or seek an in-person evaluation if the symptoms or condition worsens.  Duration of encounter: 30 minutes.  Note by: Gaspar Cola, MD Date: 08/09/2020; Time: 10:56 AM

## 2020-08-09 ENCOUNTER — Other Ambulatory Visit: Payer: Self-pay

## 2020-08-09 ENCOUNTER — Encounter: Payer: Self-pay | Admitting: Pain Medicine

## 2020-08-09 ENCOUNTER — Telehealth: Payer: Self-pay

## 2020-08-09 ENCOUNTER — Ambulatory Visit: Payer: Medicare Other | Attending: Pain Medicine | Admitting: Pain Medicine

## 2020-08-09 VITALS — BP 119/87 | HR 89 | Temp 97.1°F | Ht 60.0 in | Wt 149.0 lb

## 2020-08-09 DIAGNOSIS — M545 Low back pain, unspecified: Secondary | ICD-10-CM | POA: Insufficient documentation

## 2020-08-09 DIAGNOSIS — G8929 Other chronic pain: Secondary | ICD-10-CM | POA: Diagnosis present

## 2020-08-09 DIAGNOSIS — M5416 Radiculopathy, lumbar region: Secondary | ICD-10-CM | POA: Diagnosis not present

## 2020-08-09 DIAGNOSIS — M961 Postlaminectomy syndrome, not elsewhere classified: Secondary | ICD-10-CM | POA: Insufficient documentation

## 2020-08-09 DIAGNOSIS — G894 Chronic pain syndrome: Secondary | ICD-10-CM | POA: Diagnosis not present

## 2020-08-09 DIAGNOSIS — M5136 Other intervertebral disc degeneration, lumbar region: Secondary | ICD-10-CM | POA: Insufficient documentation

## 2020-08-09 NOTE — Progress Notes (Signed)
Safety precautions to be maintained throughout the outpatient stay will include: orient to surroundings, keep bed in low position, maintain call bell within reach at all times, provide assistance with transfer out of bed and ambulation.  

## 2020-08-09 NOTE — Patient Instructions (Signed)
____________________________________________________________________________________________  Preparing for Procedure with Sedation  Procedure appointments are limited to planned procedures: . No Prescription Refills. . No disability issues will be discussed. . No medication changes will be discussed.  Instructions: . Oral Intake: Do not eat or drink anything for at least 8 hours prior to your procedure. (Exception: Blood Pressure Medication. See below.) . Transportation: Unless otherwise stated by your physician, you may drive yourself after the procedure. . Blood Pressure Medicine: Do not forget to take your blood pressure medicine with a sip of water the morning of the procedure. If your Diastolic (lower reading)is above 100 mmHg, elective cases will be cancelled/rescheduled. . Blood thinners: These will need to be stopped for procedures. Notify our staff if you are taking any blood thinners. Depending on which one you take, there will be specific instructions on how and when to stop it. . Diabetics on insulin: Notify the staff so that you can be scheduled 1st case in the morning. If your diabetes requires high dose insulin, take only  of your normal insulin dose the morning of the procedure and notify the staff that you have done so. . Preventing infections: Shower with an antibacterial soap the morning of your procedure. . Build-up your immune system: Take 1000 mg of Vitamin C with every meal (3 times a day) the day prior to your procedure. . Antibiotics: Inform the staff if you have a condition or reason that requires you to take antibiotics before dental procedures. . Pregnancy: If you are pregnant, call and cancel the procedure. . Sickness: If you have a cold, fever, or any active infections, call and cancel the procedure. . Arrival: You must be in the facility at least 30 minutes prior to your scheduled procedure. . Children: Do not bring children with you. . Dress appropriately:  Bring dark clothing that you would not mind if they get stained. . Valuables: Do not bring any jewelry or valuables.  Reasons to call and reschedule or cancel your procedure: (Following these recommendations will minimize the risk of a serious complication.) . Surgeries: Avoid having procedures within 2 weeks of any surgery. (Avoid for 2 weeks before or after any surgery). . Flu Shots: Avoid having procedures within 2 weeks of a flu shots or . (Avoid for 2 weeks before or after immunizations). . Barium: Avoid having a procedure within 7-10 days after having had a radiological study involving the use of radiological contrast. (Myelograms, Barium swallow or enema study). . Heart attacks: Avoid any elective procedures or surgeries for the initial 6 months after a "Myocardial Infarction" (Heart Attack). . Blood thinners: It is imperative that you stop these medications before procedures. Let us know if you if you take any blood thinner.  . Infection: Avoid procedures during or within two weeks of an infection (including chest colds or gastrointestinal problems). Symptoms associated with infections include: Localized redness, fever, chills, night sweats or profuse sweating, burning sensation when voiding, cough, congestion, stuffiness, runny nose, sore throat, diarrhea, nausea, vomiting, cold or Flu symptoms, recent or current infections. It is specially important if the infection is over the area that we intend to treat. . Heart and lung problems: Symptoms that may suggest an active cardiopulmonary problem include: cough, chest pain, breathing difficulties or shortness of breath, dizziness, ankle swelling, uncontrolled high or unusually low blood pressure, and/or palpitations. If you are experiencing any of these symptoms, cancel your procedure and contact your primary care physician for an evaluation.  Remember:  Regular Business hours are:    Monday to Thursday 8:00 AM to 4:00 PM  Provider's  Schedule: Guyla Bless, MD:  Procedure days: Tuesday and Thursday 7:30 AM to 4:00 PM  Bilal Lateef, MD:  Procedure days: Monday and Wednesday 7:30 AM to 4:00 PM ____________________________________________________________________________________________   ____________________________________________________________________________________________  General Risks and Possible Complications  Patient Responsibilities: It is important that you read this as it is part of your informed consent. It is our duty to inform you of the risks and possible complications associated with treatments offered to you. It is your responsibility as a patient to read this and to ask questions about anything that is not clear or that you believe was not covered in this document.  Patient's Rights: You have the right to refuse treatment. You also have the right to change your mind, even after initially having agreed to have the treatment done. However, under this last option, if you wait until the last second to change your mind, you may be charged for the materials used up to that point.  Introduction: Medicine is not an exact science. Everything in Medicine, including the lack of treatment(s), carries the potential for danger, harm, or loss (which is by definition: Risk). In Medicine, a complication is a secondary problem, condition, or disease that can aggravate an already existing one. All treatments carry the risk of possible complications. The fact that a side effects or complications occurs, does not imply that the treatment was conducted incorrectly. It must be clearly understood that these can happen even when everything is done following the highest safety standards.  No treatment: You can choose not to proceed with the proposed treatment alternative. The "PRO(s)" would include: avoiding the risk of complications associated with the therapy. The "CON(s)" would include: not getting any of the treatment  benefits. These benefits fall under one of three categories: diagnostic; therapeutic; and/or palliative. Diagnostic benefits include: getting information which can ultimately lead to improvement of the disease or symptom(s). Therapeutic benefits are those associated with the successful treatment of the disease. Finally, palliative benefits are those related to the decrease of the primary symptoms, without necessarily curing the condition (example: decreasing the pain from a flare-up of a chronic condition, such as incurable terminal cancer).  General Risks and Complications: These are associated to most interventional treatments. They can occur alone, or in combination. They fall under one of the following six (6) categories: no benefit or worsening of symptoms; bleeding; infection; nerve damage; allergic reactions; and/or death. 1. No benefits or worsening of symptoms: In Medicine there are no guarantees, only probabilities. No healthcare provider can ever guarantee that a medical treatment will work, they can only state the probability that it may. Furthermore, there is always the possibility that the condition may worsen, either directly, or indirectly, as a consequence of the treatment. 2. Bleeding: This is more common if the patient is taking a blood thinner, either prescription or over the counter (example: Goody Powders, Fish oil, Aspirin, Garlic, etc.), or if suffering a condition associated with impaired coagulation (example: Hemophilia, cirrhosis of the liver, low platelet counts, etc.). However, even if you do not have one on these, it can still happen. If you have any of these conditions, or take one of these drugs, make sure to notify your treating physician. 3. Infection: This is more common in patients with a compromised immune system, either due to disease (example: diabetes, cancer, human immunodeficiency virus [HIV], etc.), or due to medications or treatments (example: therapies used to treat  cancer and   rheumatological diseases). However, even if you do not have one on these, it can still happen. If you have any of these conditions, or take one of these drugs, make sure to notify your treating physician. 4. Nerve Damage: This is more common when the treatment is an invasive one, but it can also happen with the use of medications, such as those used in the treatment of cancer. The damage can occur to small secondary nerves, or to large primary ones, such as those in the spinal cord and brain. This damage may be temporary or permanent and it may lead to impairments that can range from temporary numbness to permanent paralysis and/or brain death. 5. Allergic Reactions: Any time a substance or material comes in contact with our body, there is the possibility of an allergic reaction. These can range from a mild skin rash (contact dermatitis) to a severe systemic reaction (anaphylactic reaction), which can result in death. 6. Death: In general, any medical intervention can result in death, most of the time due to an unforeseen complication. ____________________________________________________________________________________________   

## 2020-08-09 NOTE — Telephone Encounter (Signed)
I scheduled her a RAC[facility] for 08/17/20 at [facility]. Please notify pharmacy.

## 2020-08-17 ENCOUNTER — Other Ambulatory Visit: Payer: Self-pay

## 2020-08-17 ENCOUNTER — Ambulatory Visit (HOSPITAL_BASED_OUTPATIENT_CLINIC_OR_DEPARTMENT_OTHER): Payer: Medicare Other | Admitting: Pain Medicine

## 2020-08-17 ENCOUNTER — Encounter: Payer: Self-pay | Admitting: Pain Medicine

## 2020-08-17 ENCOUNTER — Ambulatory Visit
Admission: RE | Admit: 2020-08-17 | Discharge: 2020-08-17 | Disposition: A | Discharge status: Home / Self Care | Payer: Medicare Other | Source: Ambulatory Visit | Attending: Pain Medicine | Admitting: Pain Medicine

## 2020-08-17 VITALS — BP 123/99 | HR 78 | Temp 97.6°F | Resp 16 | Ht 60.0 in | Wt 149.0 lb

## 2020-08-17 DIAGNOSIS — M961 Postlaminectomy syndrome, not elsewhere classified: Secondary | ICD-10-CM

## 2020-08-17 DIAGNOSIS — M5136 Other intervertebral disc degeneration, lumbar region: Secondary | ICD-10-CM | POA: Diagnosis present

## 2020-08-17 DIAGNOSIS — M545 Low back pain, unspecified: Secondary | ICD-10-CM

## 2020-08-17 DIAGNOSIS — G8929 Other chronic pain: Secondary | ICD-10-CM | POA: Diagnosis present

## 2020-08-17 DIAGNOSIS — G96198 Other disorders of meninges, not elsewhere classified: Secondary | ICD-10-CM | POA: Diagnosis present

## 2020-08-17 DIAGNOSIS — M4317 Spondylolisthesis, lumbosacral region: Secondary | ICD-10-CM | POA: Insufficient documentation

## 2020-08-17 DIAGNOSIS — M5416 Radiculopathy, lumbar region: Secondary | ICD-10-CM | POA: Insufficient documentation

## 2020-08-17 MED ORDER — CEFAZOLIN SODIUM 1 G IJ SOLR
INTRAMUSCULAR | Status: AC
  Filled 2020-08-17: qty 10

## 2020-08-17 MED ORDER — ROPIVACAINE HCL 2 MG/ML IJ SOLN
4.0000 mL | Freq: Once | INTRAMUSCULAR | Status: AC
  Administered 2020-08-17: 4 mL via EPIDURAL
  Filled 2020-08-17: qty 10

## 2020-08-17 MED ORDER — TRIAMCINOLONE ACETONIDE 40 MG/ML IJ SUSP
40.0000 mg | Freq: Once | INTRAMUSCULAR | Status: AC
  Administered 2020-08-17: 40 mg
  Filled 2020-08-17: qty 1

## 2020-08-17 MED ORDER — FENTANYL CITRATE (PF) 100 MCG/2ML IJ SOLN
25.0000 ug | INTRAMUSCULAR | Status: DC | PRN
  Filled 2020-08-17: qty 2

## 2020-08-17 MED ORDER — IOHEXOL 300 MG/ML  SOLN
15.0000 mL | Freq: Once | INTRAMUSCULAR | Status: AC | PRN
  Administered 2020-08-17: 15 mL via EPIDURAL

## 2020-08-17 MED ORDER — MIDAZOLAM HCL 2 MG/2ML IJ SOLN: 1.0000 mg | INTRAMUSCULAR | Status: DC | PRN

## 2020-08-17 MED ORDER — HYALURONIDASE HUMAN 150 UNIT/ML IJ SOLN
1500.0000 [IU] | Freq: Once | INTRAMUSCULAR | Status: AC
  Administered 2020-08-17: 1500 [IU] via INTRADERMAL
  Filled 2020-08-17: qty 10

## 2020-08-17 MED ORDER — LIDOCAINE-EPINEPHRINE (PF) 2 %-1:200000 IJ SOLN
10.0000 mL | Freq: Once | INTRAMUSCULAR | Status: AC
  Administered 2020-08-17: 10 mL
  Filled 2020-08-17: qty 20

## 2020-08-17 MED ORDER — SODIUM CHLORIDE 0.9% FLUSH
4.0000 mL | Freq: Once | INTRAVENOUS | Status: AC
  Administered 2020-08-17: 4 mL

## 2020-08-17 MED ORDER — MIDAZOLAM HCL 5 MG/5ML IJ SOLN
INTRAMUSCULAR | Status: AC
  Filled 2020-08-17: qty 5

## 2020-08-17 MED ORDER — CEFAZOLIN (ANCEF) 1 G IV SOLR
1.0000 g | INTRAVENOUS | Status: AC
  Administered 2020-08-17: 1 g
  Filled 2020-08-17: qty 1

## 2020-08-17 MED ORDER — STERILE WATER FOR INJECTION IJ SOLN
10.0000 mL | Freq: Once | INTRAVENOUS | Status: AC
  Administered 2020-08-17: 10 mL via EPIDURAL
  Filled 2020-08-17: qty 4.27

## 2020-08-17 MED ORDER — LIDOCAINE HCL (PF) 2 % IJ SOLN
10.0000 mL | Freq: Once | INTRAMUSCULAR | Status: AC
  Administered 2020-08-17: 10 mL
  Filled 2020-08-17: qty 10

## 2020-08-17 MED ORDER — LACTATED RINGERS IV SOLN
1000.0000 mL | Freq: Once | INTRAVENOUS | Status: AC
  Administered 2020-08-17: 1000 mL via INTRAVENOUS

## 2020-08-17 NOTE — Patient Instructions (Signed)

## 2020-08-17 NOTE — Progress Notes (Signed)
PROVIDER NOTE: Information contained herein reflects review and annotations entered in association with encounter. Interpretation of such information and data should be left to medically-trained personnel. Information provided to patient can be located elsewhere in the medical record under "Patient Instructions". Document created using STT-dictation technology, any transcriptional errors that may result from process are unintentional.    Patient: Jacqueline Thornton  Service Category: Procedure  Provider: Gaspar Cola, MD  DOB: 1951/04/30  DOS: 08/17/2020  Location: [facility]  MRN: [id number]  Setting: Ambulatory - outpatient  Referring Provider: Perrin Maltese, MD  Type: Established Patient  Specialty: Interventional Pain Management  PCP: Perrin Maltese, MD   Primary Reason for Visit: Interventional Pain Management Treatment. CC: Back Pain (Right lumbar )  Procedure:          Anesthesia, Analgesia, Anxiolysis:  Type: Therapeutic Percutaneous Epidural Neuroplasty and Lysis of Adhesions (RACZ Procedure)  #1  Region: Caudal Level: Sacrococcygeal   Laterality: Midline aiming at the right  Type: Moderate (Conscious) Sedation combined with Local Anesthesia Indication(s): Analgesia and Anxiety Route: Intravenous (IV) IV Access: Secured Sedation: Meaningful verbal contact was maintained at all times during the procedure  Local Anesthetic: Lidocaine 1-2%  Position: Prone   Indications: 1. Failed back surgical syndrome   2. Chronic lumbar radicular pain (S1 Dermatome) (1ry area of Pain) (Bilateral)   3. Chronic low back pain (2ry area of Pain) (Bilateral) (R>L)   4. DDD (degenerative disc disease), lumbar   5. 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 Lumbar annular disc tear (L4-5)   7. Spondylolisthesis at L5-S1 level   8. Epidural fibrosis    Pain Score: Pre-procedure: 5 /10 Post-procedure: 0-No pain/10   Pre-op H&P Assessment:  Jacqueline Thornton is a 70 y.o. (year  old), female patient, seen today for interventional treatment. She  has a past surgical history that includes Cholecystectomy; Abdominal hysterectomy; Neck surgery; Shoulder surgery; Colonoscopy with propofol (N/A, 07/19/2015); caract surger; Laparoscopic salpingo oophorectomy (Bilateral, 03/03/2018); Cystoscopy (03/03/2018); Tonsillectomy; Appendectomy; Tumor removal; Anterior lumbar fusion (N/A, 12/11/2018); and Abdominal exposure (N/A, 12/11/2018). Jacqueline Thornton has a current medication list which includes the following prescription(s): nexletol, vitamin d3, vitamin b-12, cyclobenzaprine, duloxetine, enalapril, esomeprazole, estradiol, hemocyte-plus, furosemide, gabapentin, melatonin, meloxicam, methocarbamol, oxycodone-acetaminophen, [START ON 08/19/2020] oxycodone-acetaminophen, rosuvastatin, sertraline, and oxycodone-acetaminophen, and the following Facility-Administered Medications: fentanyl and midazolam. Her primarily concern today is the Back Pain (Right lumbar )  Initial Vital Signs:  Pulse/HCG Rate: 78ECG Heart Rate: 78 Temp: (!) 96.9 F (36.1 C) Resp: 16 BP: 113/81 SpO2: 100 %  BMI: Estimated body mass index is 29.1 kg/m as calculated from the following:   Height as of this encounter: 5' (1.524 m).   Weight as of this encounter: 149 lb (67.6 kg).  Risk Assessment: Allergies: Reviewed. She has No Known Allergies.  Allergy Precautions: None required Coagulopathies: Reviewed. None identified.  Blood-thinner therapy: None at this time Active Infection(s): Reviewed. None identified. Jacqueline Thornton is afebrile  Site Confirmation: Jacqueline Thornton was asked to confirm the procedure and laterality before marking the site Procedure checklist: Completed Consent: Before the procedure and under the influence of no sedative(s), amnesic(s), or anxiolytics, the patient was informed of the treatment options, risks and possible complications. To fulfill our ethical and legal obligations, as recommended by the  American Medical Association's Code of Ethics, I have informed the patient of my clinical impression; the nature and purpose of the treatment or procedure; the risks, benefits, and possible complications of  the intervention; the alternatives, including doing nothing; the risk(s) and benefit(s) of the alternative treatment(s) or procedure(s); and the risk(s) and benefit(s) of doing nothing. The patient was provided information about the general risks and possible complications associated with the procedure. These may include, but are not limited to: failure to achieve desired goals, infection, bleeding, organ or nerve damage, allergic reactions, paralysis, and death. In addition, the patient was informed of those risks and complications associated to Spine-related procedures, such as failure to decrease pain; infection (i.e.: Meningitis, epidural or intraspinal abscess); bleeding (i.e.: epidural hematoma, subarachnoid hemorrhage, or any other type of intraspinal or peri-dural bleeding); organ or nerve damage (i.e.: Any type of peripheral nerve, nerve root, or spinal cord injury) with subsequent damage to sensory, motor, and/or autonomic systems, resulting in permanent pain, numbness, and/or weakness of one or several areas of the body; allergic reactions; (i.e.: anaphylactic reaction); and/or death. Furthermore, the patient was informed of those risks and complications associated with the medications. These include, but are not limited to: allergic reactions (i.e.: anaphylactic or anaphylactoid reaction(s)); adrenal axis suppression; blood sugar elevation that in diabetics may result in ketoacidosis or comma; water retention that in patients with history of congestive heart failure may result in shortness of breath, pulmonary edema, and decompensation with resultant heart failure; weight gain; swelling or edema; medication-induced neural toxicity; particulate matter embolism and blood vessel occlusion with  resultant organ, and/or nervous system infarction; and/or aseptic necrosis of one or more joints. Finally, the patient was informed that Medicine is not an exact science; therefore, there is also the possibility of unforeseen or unpredictable risks and/or possible complications that may result in a catastrophic outcome. The patient indicated having understood very clearly. We have given the patient no guarantees and we have made no promises. Enough time was given to the patient to ask questions, all of which were answered to the patient's satisfaction. Ms. Fiebig has indicated that she wanted to continue with the procedure. Attestation: I, the ordering provider, attest that I have discussed with the patient the benefits, risks, side-effects, alternatives, likelihood of achieving goals, and potential problems during recovery for the procedure that I have provided informed consent. Date  Time: 08/17/2020  9:13 AM  Pre-Procedure Preparation:  Monitoring: As per clinic protocol. Respiration, ETCO2, SpO2, BP, heart rate and rhythm monitor placed and checked for adequate function Safety Precautions: Patient was assessed for positional comfort and pressure points before starting the procedure. Time-out: I initiated and conducted the "Time-out" before starting the procedure, as per protocol. The patient was asked to participate by confirming the accuracy of the "Time Out" information. Verification of the correct person, site, and procedure were performed and confirmed by me, the nursing staff, and the patient. "Time-out" conducted as per Joint Commission's Universal Protocol (UP.01.01.01). Time: 1040  Description of Procedure:          Target Area: Caudal Epidural Canal Approach: Midline approach Area Prepped: Entire Posterior Sacrococcygeal Region DuraPrep (Iodine Povacrylex [0.7% available iodine] and Isopropyl Alcohol, 74% w/w) Safety Precautions: Aspiration looking for blood return was conducted prior  to all injections. At no point did I inject any substances, as a needle was being advanced. No attempts were made at seeking any paresthesias. Safe injection practices and needle disposal techniques used. Medications properly checked for expiration dates. SDV (single dose vial) medications used. Description of the Procedure: Protocol guidelines were followed. The patient was placed in position over the fluoroscopy table. Patient assessed for comfort and pressure points before  starting procedure. The target area was identified and the area prepped in the usual manner. Skin & deeper tissues infiltrated with local anesthetic. Appropriate amount of time allowed to pass for local anesthetics to take effect. The epidural needle was then advanced to the target area, via the sacral hiatus, between the sacral cornu. Proper needle placement secured. Negative aspiration confirmed. Step 1: Epidurogram performed by slowly injecting a non-ionic, water-soluble, hypoallergenic, myelogram-compatible radiological contrast. Defect identified and Racz catheter advanced slowly next to rest proximal to it without attempting to perforate or puncture the defect. At this point, the epidural needle was removed. Step 2: Contrast was again injected, this time thru the catheter, under live fluoroscopy, closely observing for vascular uptake or intrathecal spread. Step 3: Once no vascular uptake or intrathecal spread was confirmed, a 2 mL test-dose using 2% PF-Lidocaine with 1:200,000 Epinephrine was injected thru the catheter, while closely monitoring for an increase in heart rate or evidence of spinal anesthesia. Step 4: After waiting over 5 minutes, the patient was assessed for evidence of a spinal block. Step 5: Once I had confirmed that there was no vascular uptake or evidence of intrathecal spread, I then slowly injected 1,500 Units of hyaluronidase, carefully monitoring for allergic reactions. I then waited 10 minutes, using this  time to secure the catheter using a sterile benzoin tincture swab and (88m x 100 mm) steri-strip. After confirming vitals to be stable, the patient was transferred to the recovery area where Ms. Rubert was kept under constant observation and monitored as per post-sedation protocol. Step 6: After the 10 minutes, I proceeded to slowly inject a solution containing 0.2% MPF-Ropivacaine (4 mL) + 0.9% PF-NSS (5 mL) + SDV Triamcinolone 40 mg/mL (1 mL), in intermittent fashion, asking for systemic symptoms every 0.5cc of injectate. Step 7:  30 minutes later, a neurological exam was conducted for any evidence of a spinal blockade. Step 8:  After confirming the absence of anesthesia, I slowly injected the 10% PF-Hypertonic Saline, stopping to assess, any time the patient described any discomfort. Once the procedure was completed, I removed the catheter, taking time to show the patient its spring tip, and demonstrating to the patient that none had been left behind. EBL: None Materials & Medications used:  1. Racz Tun-L-Kath (Epimed, [place] Catheter. (or similar)(Perifix 20Gx100cm) 2. 2" Foam Tape 3. Benzoin tincture swab 4. Steri-Strip (145mx 100 mm) 5. Non-occlusive Transparent Dressing (3MT TegadermT) 6. Bacteriostatic Filter for Epidural Catheter 7. Epidural Kit for 20G catheter Needle(s) used: 20g - 10cm, Tuohy-style epidural needle   Medications used:  1. Skin infiltration: 2.0% Lidocaine (1086m2. Test-dose: 1.5 % Lidocaine w/ Epi 1:200,000 (5ml2m. Epidural Steroid injection:  A). Steroid: Triamcinolone 40 mg/mL (SDV) (1ml)20m. Local Anesthetic: 0.2% PF-Ropivacaine (2 mg/mL) (4 mL) C). Dilution agent: 0.9% PF-NSS (injectable saline) (5 mL) 6. Neurolytic Agent: 10% PF-Sodium Chloride (Hypertonic Saline) (10ml)61m.4% PF-Sodium Chloride (4.273mL) 69m-Sterile H2O (5.727mL) =58m PF-Sodium Chloride (10mL)] 744mar softening agent: Hyaluronidase (150 units/mL) x (10 mL) = 1500 Units 8.  Radiological Contrast Media: Isovue-M 200 (10 mL)  Vitals:   08/17/20 1232 08/17/20 1242 08/17/20 1252 08/17/20 1304  BP: 110/83  117/67 (!) 123/99  Pulse:      Resp: 20 16 14 16   Temp:    97.6 F (36.4 C)  TempSrc:    Temporal  SpO2: 96% 98% 100% 100%  Weight:      Height:        Start Time:  1040 hrs. End Time: 1110 (last injection at 1240) hrs.  Epidurogram:  Epidurogram flow pattern demonstrated a restricted flow at the level of the surgery. The epidural catheter was placed next to this restriction without attempting to perforate it.  Please see images below for catheter placement. Materials:  Needle(s) Type: Epidural needle Gauge: 20G Length: 3.5-in Medication(s): Please see orders for medications and dosing details.  Imaging Guidance (Spinal):          Type of Imaging Technique: Fluoroscopy Guidance (Spinal) Indication(s): Assistance in needle guidance and placement for procedures requiring needle placement in or near specific anatomical locations not easily accessible without such assistance. Exposure Time: Please see nurses notes. Contrast: Before injecting any contrast, we confirmed that the patient did not have an allergy to iodine, shellfish, or radiological contrast. Once satisfactory needle placement was completed at the desired level, radiological contrast was injected. Contrast injected under live fluoroscopy. No contrast complications. See chart for type and volume of contrast used. Fluoroscopic Guidance: I was personally present during the use of fluoroscopy. "Tunnel Vision Technique" used to obtain the best possible view of the target area. Parallax error corrected before commencing the procedure. "Direction-depth-direction" technique used to introduce the needle under continuous pulsed fluoroscopy. Once target was reached, antero-posterior, oblique, and lateral fluoroscopic projection used confirm needle placement in all planes. Images permanently stored in EMR.       Interpretation: I personally interpreted the imaging intraoperatively. Adequate needle placement confirmed in multiple planes. Appropriate spread of contrast into desired area was observed. No evidence of afferent or efferent intravascular uptake. No intrathecal or subarachnoid spread observed. Permanent images saved into the patient's record.  Antibiotic Prophylaxis:   Anti-infectives (From admission, onward)   Start     Dose/Rate Route Frequency Ordered Stop   08/17/20 0930  ceFAZolin (ANCEF) powder 1 g        1 g Other 30 min pre-op 08/17/20 0926 08/17/20 0929     Indication(s): None identified  Post-operative Assessment:  Post-procedure Vital Signs:  Pulse/HCG Rate: 7882 Temp: 97.6 F (36.4 C) Resp: 16 BP: (!) 123/99 SpO2: 100 %  EBL: None  Complications: No immediate post-treatment complications observed by team, or reported by patient.  Note: The patient tolerated the entire procedure well. A repeat set of vitals were taken after the procedure and the patient was kept under observation following institutional policy, for this type of procedure. Post-procedural neurological assessment was performed, showing return to baseline, prior to discharge. The patient was provided with post-procedure discharge instructions, including a section on how to identify potential problems. Should any problems arise concerning this procedure, the patient was given instructions to immediately contact us, at any time, without hesitation. In any case, we plan to contact the patient by telephone for a follow-up status report regarding this interventional procedure.  Comments:  No additional relevant information.  Plan of Care  Orders:  Orders Placed This Encounter  Procedures  . Racz (One Day)    Equipment: Epidural Tray; Racz Catheter; 10% Hypertonic Saline; Hyaluronidase; 3/4" Steri-strips; 4x4 Sterile Gauze pack.    Scheduling Instructions:     Procedure: RACZ Epidural Lysis of Adhesions      Side: Midline     Sedation: Moderate Conscious Sedation     Timeframe: Today  . DG PAIN [facility] C-ARM 1-60 MIN NO REPORT    Intraoperative interpretation by procedural physician at [place].    Standing Status:   Standing    Number of Occurrences:  1    Order Specific Question:   Reason for exam:    Answer:   Assistance in needle guidance and placement for procedures requiring needle placement in or near specific anatomical locations not easily accessible without such assistance.  . Informed Consent Details: Physician/Practitioner Attestation; Transcribe to consent form and obtain patient signature    Nursing Order: Transcribe to consent form and obtain patient signature. Note: Always confirm laterality of pain with Ms. Mcswain, before procedure.    Order Specific Question:   Physician/Practitioner attestation of informed consent for procedure/surgical case    Answer:   I, the physician/practitioner, attest that I have discussed with the patient the benefits, risks, side effects, alternatives, likelihood of achieving goals and potential problems during recovery for the procedure that I have provided informed consent.    Order Specific Question:   Procedure    Answer:   RACZ Procedure (Epidural Lysis of Adhesions)    Order Specific Question:   Physician/Practitioner performing the procedure    Answer:   Yehya Brendle A. Dossie Arbour, MD    Order Specific Question:   Indication/Reason    Answer:   Chronic low back pain and lower extremity pain secondary to epidural fibrosis associated with a failed back surgical syndrome  . Provide equipment / supplies at bedside    "Epidural Tray" (Disposable  single use) Catheter: NOT required    Standing Status:   Standing    Number of Occurrences:   1    Order Specific Question:   Specify    Answer:   Epidural Tray   Chronic Opioid Analgesic:  Oxycodone/APAP 7.5/325, 1 tab PO q 5 times a day. (56.25 mg/day of oxycodone) MME/day:56.25 mg/day.    Medications ordered for procedure: Meds ordered this encounter  Medications  . lactated ringers infusion 1,000 mL  . ceFAZolin (ANCEF) powder 1 g  . midazolam (VERSED) injection 1-2 mg    Make sure Flumazenil is available in the pyxis when using this medication. If oversedation occurs, administer 0.2 mg IV over 15 sec. If after 45 sec no response, administer 0.2 mg again over 1 min; may repeat at 1 min intervals; not to exceed 4 doses (1 mg)  . fentaNYL (SUBLIMAZE) injection 25-50 mcg    Make sure Narcan is available in the pyxis when using this medication. In the event of respiratory depression (RR< 8/min): Titrate NARCAN (naloxone) in increments of 0.1 to 0.2 mg IV at 2-3 minute intervals, until desired degree of reversal.  . lidocaine HCl (PF) (XYLOCAINE) 2 % injection 10 mL    If 2.0 % is unavailable, it may be substituted with 1.5 %. Always notify physician of changes before procedure.  . lidocaine-EPINEPHrine (XYLOCAINE W/EPI) 2 %-1:200000 (PF) injection 10 mL    If 2% is unavailable, may be substituted with 1.5%, but must be preservative-free. If 1:200,000 concentration of epinephrine is not available, it may be substituted with 1:100,000. Notify physician of changes, before procedure.  . ropivacaine (PF) 2 mg/mL (0.2%) (NAROPIN) injection 4 mL  . triamcinolone acetonide (KENALOG-40) injection 40 mg  . iohexol (OMNIPAQUE) 300 MG/ML solution 15 mL  . hyaluronidase Human (HYLENEX) injection 1,500 Units    10 mL of the 150 Units/mL  . sodium chloride hypertonic 10% epidural injection    For Racz Epidural Lysis of Adhesions.  . sodium chloride flush (NS) 0.9 % injection 4 mL   Medications administered: We administered lactated ringers, ceFAZolin, lidocaine HCl (PF), lidocaine-EPINEPHrine, ropivacaine (PF) 2 mg/mL (0.2%), triamcinolone acetonide, iohexol, hyaluronidase  Human, sodium chloride hypertonic 10% epidural injection, and sodium chloride flush.  See the medical record for exact  dosing, route, and time of administration.  Follow-up plan:   Return in about 2 weeks (around 08/31/2020) for (F2F), (PP) Follow-up.       Interventional Therapies  Risk  Complexity Considerations:   WNL   Planned  Pending:   Pending further evaluation   Under consideration:   Diagnostic caudal diagnostic epidurogram Diagnostic CESI Diagnostic bilateral cervical facet block Possible bilateral cervical facet RFA  Diagnostic left L4-5 LESI  Diagnostic left S1 SNRB   Completed:   Diagnostic caudal ESI x1 (07/11/2020) Palliative left L5-S1 LESI x2 (07/27/2015)  Diagnostic left lumbar facet MBB x6 (05/14/2018)  Palliative right lumbar facet MBB x9 (06/13/2020)  Therapeutic right shoulder joint injection x1 (02/29/2016)  Therapeutic left IA hip injection x3 (06/13/2020)  Diagnostic right SI joint block x3 (06/17/2017)  Diagnostic/therapeutic left SI joint block x4 (06/17/2017)  Palliative bilateral trochanteric bursa injection x1 (12/11/2016)  Therapeutic right lumbar facet MB RFA x1 (08/07/2017)  Therapeutic left lumbar facet MB RFA x1 (09/16/2017)  Therapeutic right SI joint RFA x1 (08/07/2017)  Therapeutic left SI joint RFA x1 (09/16/2017)    Therapeutic  Palliative (PRN) options:   Palliativebilateral lumbar facet block  Palliativebilateral SI joint block Palliativeright trochanteric bursa injection    Recent Visits Date Type Provider Dept  08/09/20 Office Visit Milinda Pointer, MD [facility]  07/11/20 Procedure visit Milinda Pointer, MD [facility]  06/28/20 Telemedicine Milinda Pointer, MD [facility]  06/13/20 Procedure visit Milinda Pointer, MD [facility]  05/24/20 Telemedicine Milinda Pointer, MD [facility]  Showing recent visits within past 90 days and meeting all other requirements Today's Visits Date Type Provider Dept  08/17/20 Procedure visit Milinda Pointer, MD [facility]   Showing today's visits and meeting all other requirements Future Appointments Date Type Provider Dept  09/13/20 Appointment Milinda Pointer, MD [facility]  Showing future appointments within next 90 days and meeting all other requirements  Disposition: Discharge home  Discharge (Date  Time): 08/17/2020; 1305 hrs.   Primary Care Physician: Perrin Maltese, MD Location: [facility] Note by: Gaspar Cola, MD Date: 08/17/2020; Time: 2:45 PM  Disclaimer:  Medicine is not an Chief Strategy Officer. The only guarantee in medicine is that nothing is guaranteed. It is important to note that the decision to proceed with this intervention was based on the information collected from the patient. The Data and conclusions were drawn from the patient's questionnaire, the interview, and the physical examination. Because the information was provided in large part by the patient, it cannot be guaranteed that it has not been purposely or unconsciously manipulated. Every effort has been made to obtain as much relevant data as possible for this evaluation. It is important to note that the conclusions that lead to this procedure are derived in large part from the available data. Always take into account that the treatment will also be dependent on availability of resources and existing treatment guidelines, considered by other Pain Management Practitioners as being common knowledge and practice, at the time of the intervention. For Medico-Legal purposes, it is also important to point out that variation in procedural techniques and pharmacological choices are the acceptable norm. The indications, contraindications, technique, and results of the above procedure should only be interpreted and judged by a Board-Certified Interventional Pain Specialist with extensive familiarity and expertise in the same exact procedure  and technique.

## 2020-08-17 NOTE — Progress Notes (Signed)
Safety precautions to be maintained throughout the outpatient stay will include: orient to surroundings, keep bed in low position, maintain call bell within reach at all times, provide assistance with transfer out of bed and ambulation.  

## 2020-08-18 ENCOUNTER — Telehealth: Payer: Self-pay

## 2020-08-18 NOTE — Telephone Encounter (Signed)
Post procedure follow up phone call.  LM 

## 2020-09-09 NOTE — Progress Notes (Signed)
PROVIDER NOTE: Information contained herein reflects review and annotations entered in association with encounter. Interpretation of such information and data should be left to medically-trained personnel. Information provided to patient can be located elsewhere in the medical record under "Patient Instructions". Document created using STT-dictation technology, any transcriptional errors that may result from process are unintentional.    Patient: Jacqueline Thornton  Service Category: E/M  Provider: Oswaldo Done, MD  DOB: 1950/11/30  DOS: 09/13/2020  Specialty: Interventional Pain Management  MRN: [id number]  Setting: Ambulatory outpatient  PCP: Margaretann Loveless, MD  Type: Established Patient    Referring Provider: Margaretann Loveless, MD  Location: Office  Delivery: Face-to-face     HPI  Jacqueline Thornton, a 70 y.o. year old female, is here today because of her Chronic pain syndrome [G89.4]. Jacqueline Thornton's primary complain today is Back Pain (Right and left, lower) Last encounter: My last encounter with her was on 08/17/2020. Pertinent problems: Jacqueline Thornton has Chronic pain syndrome; Headache, migraine; Chronic low back pain (2ry area of Pain) (Bilateral) (R>L); Spondylosis of lumbar spine; Lumbar annular disc tear (L4-5); Discogenic low back pain (L3-4 and L4-5); Lumbar facet hypertrophy; Lumbar facet syndrome (Bilateral) (R>L); Chronic neck pain (Left); Cervical spondylosis; Hx of cervical spine surgery; Cervical spinal fusion (C6-7 interbody fusion); Chronic sacroiliac joint pain (Bilateral) (L>R); Chronic lumbar radicular pain (S1 Dermatome) (1ry area of Pain) (Bilateral); Trochanteric bursitis of right hip; Chronic hip pain (Bilateral) (L>R); Neurogenic pain; Myofascial pain; Osteoarthritis of hip (Left); Arthrodesis status; DDD (degenerative disc disease), lumbar; Myalgia; Trochanteric bursitis of hip (Bilateral) (L>R); Spondylosis without myelopathy or radiculopathy, lumbosacral region; Spondylosis  without myelopathy or radiculopathy, sacral and sacrococcygeal region; Other specified dorsopathies, sacral and sacrococcygeal region; Age-related osteoporosis without current pathological fracture; Bone island of right femur; Inflammatory spondylopathy of lumbar region [facility]); Trigger point with back pain (Right); Fibromyalgia; DDD (degenerative disc disease), cervical; Spondylolisthesis of lumbar region; History of lumbar spinal fusion (Dr. Maeola Harman) (12/11/2018); Failed back surgical syndrome; Unilateral occipital headache (Left); Cervicogenic headache (Left); Chronic hip pain (Left); Spondylolisthesis at L5-S1 level; Spondylosis of lumbosacral region without myelopathy or radiculopathy; Epidural fibrosis; Enthesopathy of hip region (Left); Chronic sacroiliac joint pain (Left); Enthesopathy of sacroiliac joint (Left); and Somatic dysfunction of sacroiliac joint (Left) on their pertinent problem list. Pain Assessment: Severity of Chronic pain is reported as a 3 /10. Location: Back Right,Lower,Left/left buttocks. Onset: More than a month ago. Quality: Aching. Timing: Constant. Modifying factor(s): ice, heat, medications. Vitals:  height is 5' (1.524 m) and weight is 149 lb (67.6 kg). Her temporal temperature is 97 F (36.1 C) (abnormal). Her blood pressure is 119/83 and her pulse is 97. Her respiration is 16 and oxygen saturation is 100%.   Reason for encounter: both, medication management and post-procedure assessment.   The patient indicates doing well with the current medication regimen. No adverse reactions or side effects reported to the medications.  According to the patient she is pending to schedule her cervical spine surgery with Dr. Venetia Maxon within the next couple of days.  She returns today after having had her first right sided Racz procedure which provided her with 100% relief of the right lower extremity pain.  Currently she indicates having an ongoing 85% improvement in her pain but she still  having some pain in the left lower extremity and lower back which she describes going through the back of the leg and the lateral aspect of the leg down to the ankle but not  going into the foot.  Physical exam today demonstrated the patient to be having a left hip arthralgia and a left sacroiliac joint arthralgia when tested with the Patrick maneuver and pelvic distraction test.  The right side was completely negative for any SI joint or hip joint symptoms.  Today I have reviewed imaging of the left hip which we obtained on 12/17/2019.  At the time and indicated no acute findings of the pelvis or left hip, but it did demonstrate a stable looking sclerotic lesion in the right acetabulum that appears unchanged when evaluated against prior imaging.  In addition, I reviewed an MRI of the SI joints done on 12/26/2017 which showed a normal-appearing sacrum and coccyx with an apparent enlarging complex left adnexal cyst for which they recommend an ultrasound.  The follow-up ultrasound conducted on 01/14/2018 demonstrated this to be a left ovarian cyst for which the patient today indicated that she had some subsequent surgery.  Based on today's evaluation, it is clear to me that the Racz procedure was successful in providing this patient with relief of the pain from her epidural fibrosis.  However she does have some pain remaining in the opposite leg which upon examining seems to be coming from the SI joint on the hip joint.  Today I talked to the patient about the possibility of doing a left SI joint and a left IA hip joint injection, but in view of the fact that she is pending the cervical surgery, we will hold on this to be done in the future.  The plan was developed in agreement with the patient.  RTCB: 12/17/2020 Nonopioids transferred 05/24/2020: Vitamin B12, Flexeril, and Neurontin.  Post-Procedure Evaluation  Procedure (08/17/2020): Therapeutic midline to right Racz procedure # 1 under fluoroscopic guidance  and IV sedation Pre-procedure pain level: 5/10 Post-procedure: 0/10 (100% relief)  Sedation: Sedation provided.  Effectiveness during initial hour after procedure(Ultra-Short Term Relief): 10 %.  Local anesthetic used: Long-acting (4-6 hours) Effectiveness: Defined as any analgesic benefit obtained secondary to the administration of local anesthetics. This carries significant diagnostic value as to the etiological location, or anatomical origin, of the pain. Duration of benefit is expected to coincide with the duration of the local anesthetic used.  Effectiveness during initial 4-6 hours after procedure(Short-Term Relief): 10 %.  Long-term benefit: Defined as any relief past the pharmacologic duration of the local anesthetics.  Effectiveness past the initial 6 hours after procedure(Long-Term Relief): 85 %.  Current benefits: Defined as benefit that persist at this time.   Analgesia:  The patient indicates currently enjoying an ongoing 85% improvement of her pain.  She refers that she is no longer having any right lower extremity pain.  However, she still having some pain in the left lower back and left leg that travels down the back of the leg and the lateral aspect of the leg to the ankle but not into the foot. Function: Jacqueline Thornton reports improvement in function ROM: Jacqueline Thornton reports improvement in ROM  Pharmacotherapy Assessment   Analgesic: Oxycodone/APAP 7.5/325, 1 tab PO q 5 times a day. (56.25 mg/day of oxycodone) MME/day:56.25 mg/day.   Monitoring: [place] PMP: PDMP reviewed during this encounter.       Pharmacotherapy: No side-effects or adverse reactions reported. Compliance: No problems identified. Effectiveness: Clinically acceptable.  Jacqueline Elk, Jacqueline Thornton  09/13/2020  9:00 AM  Sign when Signing Visit Nursing Pain Medication Assessment:  Safety precautions to be maintained throughout the outpatient stay will include: orient to surroundings, keep  bed in low position, maintain  call bell within reach at all times, provide assistance with transfer out of bed and ambulation.  Medication Inspection Compliance: Pill count conducted under aseptic conditions, in front of the patient. Neither the pills nor the bottle was removed from the patient's sight at any time. Once count was completed pills were immediately returned to the patient in their original bottle.  Medication: Oxycodone/APAP Pill/Patch Count: 27 of 150 pills remain Pill/Patch Appearance: Markings consistent with prescribed medication Bottle Appearance: Standard pharmacy container. Clearly labeled. Filled Date: 08/20/2020 Last Medication intake:  Today    UDS:  Summary  Date Value Ref Range Status  12/16/2019 Note  Final    Comment:    ==================================================================== [employer] (MW) ==================================================================== Test                             Result       Flag       Units  Drug Present and Declared for Prescription Verification   Oxycodone                      3206         EXPECTED   ng/mg creat   Oxymorphone                    1043         EXPECTED   ng/mg creat   Noroxycodone                   3386         EXPECTED   ng/mg creat   Noroxymorphone                 175          EXPECTED   ng/mg creat    Sources of oxycodone are scheduled prescription medications.    Oxymorphone, noroxycodone, and noroxymorphone are expected    metabolites of oxycodone. Oxymorphone is also available as a    scheduled prescription medication.  ==================================================================== Test                      Result    Flag   Units      Ref Range   Creatinine              95               mg/dL      >=40 ==================================================================== Declared Medications:  The flagging and interpretation on this report are based on the  following declared medications.  Unexpected  results may arise from  inaccuracies in the declared medications.   **Note: The testing scope of this panel includes these medications:   Oxycodone (Percocet)   **Note: The testing scope of this panel does not include the  following reported medications:   Acetaminophen (Percocet)  Cyclobenzaprine (Flexeril)  Duloxetine (Cymbalta)  Enalapril (Vasotec)  Esomeprazole (Nexium)  Estradiol (Estrace)  Gabapentin (Neurontin)  Hydrochlorothiazide (Hydrodiuril)  Melatonin  Rivaroxaban (Xarelto)  Rosuvastatin (Crestor)  Sertraline (Zoloft)  Vitamin B12  Vitamin D3 ==================================================================== For clinical consultation, please call [phone]. ====================================================================      ROS  Constitutional: Denies any fever or chills Gastrointestinal: No reported hemesis, hematochezia, vomiting, or acute GI distress Musculoskeletal: Denies any acute onset joint swelling, redness, loss of ROM, or weakness Neurological: No reported episodes of acute onset apraxia, aphasia, dysarthria,  agnosia, amnesia, paralysis, loss of coordination, or loss of consciousness  Medication Review  Bempedoic Acid, DULoxetine, Vitamin B-12, Vitamin D3, cyclobenzaprine, enalapril, esomeprazole, estradiol, furosemide, gabapentin, melatonin, oxyCODONE-acetaminophen, and sertraline  History Review  Allergy: Ms. Broomell has No Known Allergies. Drug: Ms. Stauff  reports no history of drug use. Alcohol:  reports no history of alcohol use. Tobacco:  reports that she quit smoking about 17 years ago. She has never used smokeless tobacco. Social: Ms. Fugate  reports that she quit smoking about 17 years ago. She has never used smokeless tobacco. She reports that she does not drink alcohol and does not use drugs. Medical:  has a past medical history of Absolute anemia (04/11/2015), Acute postoperative pain (08/07/2017), Angina pectoris (HCC),  Anxiety, Arthritis, Arthropathy of sacroiliac joint (04/11/2015), Atypical face pain (04/24/2015), Back pain, CAD (coronary artery disease), Cancer (HCC), Chronic back pain, Depression, Fibromyalgia, GERD (gastroesophageal reflux disease), 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) (04/11/2015), H/O: hysterectomy (1979), Heart murmur, Hyperlipidemia, Hypertension, Low back pain (04/06/2015), Lumbar radicular pain (04/18/2015), Migraine, Narrowing of intervertebral disc space (04/11/2015), Sacroiliac joint pain (04/11/2015), and Spine disorder. Surgical: Ms. Vicens  has a past surgical history that includes Cholecystectomy; Abdominal hysterectomy; Neck surgery; Shoulder surgery; Colonoscopy with propofol (N/A, 07/19/2015); caract surger; Laparoscopic salpingo oophorectomy (Bilateral, 03/03/2018); Cystoscopy (03/03/2018); Tonsillectomy; Appendectomy; Tumor removal; Anterior lumbar fusion (N/A, 12/11/2018); and Abdominal exposure (N/A, 12/11/2018). Family: family history includes Colon cancer in her maternal grandmother and mother; Diabetes in her father and mother; Hypertension in her sister; Stroke in her mother.  Laboratory Chemistry Profile   Renal Lab Results  Component Value Date   BUN 11 12/16/2019   CREATININE 1.35 (H) 12/16/2019   BCR 8 (L) 12/16/2019   GFRAA 47 (L) 12/16/2019   GFRNONAA 41 (L) 12/16/2019     Hepatic Lab Results  Component Value Date   AST 15 12/16/2019   ALT 14 03/08/2018   ALBUMIN 4.0 12/16/2019   ALKPHOS 60 12/16/2019     Electrolytes Lab Results  Component Value Date   NA 140 12/16/2019   K 4.5 12/16/2019   CL 100 12/16/2019   CALCIUM 8.8 12/16/2019   MG 2.4 (H) 12/16/2019     Bone Lab Results  Component Value Date   25OHVITD1 53 12/16/2019   25OHVITD2 <1.0 12/16/2019   25OHVITD3 53 12/16/2019     Inflammation (CRP: Acute Phase) (ESR: Chronic Phase) Lab Results  Component Value Date   CRP 4 12/16/2019   ESRSEDRATE 6 12/16/2019       Note: Above Lab  results reviewed.  Recent Imaging Review  DG PAIN CLINIC C-ARM 1-60 MIN NO REPORT Fluoro was used, but no Radiologist interpretation will be provided.  Please refer to "NOTES" tab for provider progress note. Note: Reviewed        Physical Exam  General appearance: Well nourished, well developed, and well hydrated. In no apparent acute distress Mental status: Alert, oriented x 3 (person, place, & time)       Respiratory: No evidence of acute respiratory distress Eyes: PERLA Vitals: BP 119/83   Pulse 97   Temp (!) 97 F (36.1 C) (Temporal)   Resp 16   Ht 5' (1.524 m)   Wt 149 lb (67.6 kg)   SpO2 100%   BMI 29.10 kg/m  BMI: Estimated body mass index is 29.1 kg/m as calculated from the following:   Height as of this encounter: 5' (1.524 m).   Weight as of this encounter: 149 lb (67.6 kg). Ideal:  Ideal body weight: 45.5 kg (100 lb 4.9 oz) Adjusted ideal body weight: 54.3 kg (119 lb 12.6 oz)  Assessment   Status Diagnosis  Controlled Controlled Controlled 1. Chronic pain syndrome   2. Chronic lumbar radicular pain (S1 Dermatome) (1ry area of Pain) (Bilateral)   3. Chronic low back pain (2ry area of Pain) (Bilateral) (R>L)   4. Failed back surgical syndrome   5. 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 Lumbar annular disc tear (L4-5)   7. Epidural fibrosis   8. Chronic hip pain (Left)   9. Enthesopathy of hip region (Left)   10. Chronic sacroiliac joint pain (Left)   11. Enthesopathy of sacroiliac joint (Left)   12. Somatic dysfunction of sacroiliac joint (Left)   13. Pharmacologic therapy   14. Uncomplicated opioid dependence (HCC)      Updated Problems: Problem  Enthesopathy of hip region (Left)  Chronic sacroiliac joint pain (Left)  Enthesopathy of sacroiliac joint (Left)  Somatic dysfunction of sacroiliac joint (Left)  Chronic hip pain (Left)    Plan of Care  Problem-specific:  No problem-specific Assessment & Plan notes found for this encounter.  Ms.  NEZZIE MAYER has a current medication list which includes the following long-term medication(s): vitamin b-12, cyclobenzaprine, duloxetine, enalapril, esomeprazole, furosemide, gabapentin, [START ON 09/18/2020] oxycodone-acetaminophen, [START ON 10/18/2020] oxycodone-acetaminophen, [START ON 11/17/2020] oxycodone-acetaminophen, and sertraline.  Pharmacotherapy (Medications Ordered): Meds ordered this encounter  Medications  . oxyCODONE-acetaminophen (PERCOCET) 7.5-325 MG tablet    Sig: Take 1 tablet by mouth 5 (five) times daily. Must last 30 days    Dispense:  150 tablet    Refill:  0    Chronic Pain: STOP Act (Not applicable) Fill 1 day early if closed on refill date. Avoid benzodiazepines within 8 hours of opioids  . oxyCODONE-acetaminophen (PERCOCET) 7.5-325 MG tablet    Sig: Take 1 tablet by mouth 5 (five) times daily. Must last 30 days    Dispense:  150 tablet    Refill:  0    Chronic Pain: STOP Act (Not applicable) Fill 1 day early if closed on refill date. Avoid benzodiazepines within 8 hours of opioids  . oxyCODONE-acetaminophen (PERCOCET) 7.5-325 MG tablet    Sig: Take 1 tablet by mouth 5 (five) times daily. Must last 30 days    Dispense:  150 tablet    Refill:  0    Chronic Pain: STOP Act (Not applicable) Fill 1 day early if closed on refill date. Avoid benzodiazepines within 8 hours of opioids   Orders:  Orders Placed This Encounter  Procedures  . SACROILIAC JOINT INJECTION    Standing Status:   Standing    Number of Occurrences:   1    Standing Expiration Date:   01/13/2021    Scheduling Instructions:     Side: Left-sided     Sedation: Patient's choice.     Timeframe: The patient will call when needed    Order Specific Question:   Where will this procedure be performed?    Answer:   [facility]  . HIP INJECTION    Standing Status:   Standing    Number of Occurrences:   1    Standing Expiration Date:   01/13/2021    Scheduling Instructions:     Side:  Left-sided     Sedation: Patient's choice.     Timeframe: The patient will call when needed   Follow-up plan:   Return in about 3 months (around 12/17/2020)  for (F2F), (MM), in addition PRN procedure (L) SI & Hip.      Interventional Therapies  Risk  Complexity Considerations:   WNL   Planned  Pending:   Pending further evaluation   Under consideration:   Diagnostic caudal diagnostic epidurogram Diagnostic CESI Diagnostic bilateral cervical facet block Possible bilateral cervical facet RFA  Diagnostic left L4-5 LESI  Diagnostic left S1 SNRB   Completed:   Therapeutic right Racz procedure x1 (08/17/2020) (100% relief of the right lower extremity pain) Diagnostic caudal ESI x1 (07/11/2020) Palliative left L5-S1 LESI x2 (07/27/2015)  Diagnostic left lumbar facet MBB x6 (05/14/2018)  Palliative right lumbar facet MBB x9 (06/13/2020)  Therapeutic right shoulder joint injection x1 (02/29/2016)  Therapeutic left IA hip injection x3 (06/13/2020)  Diagnostic right SI joint block x3 (06/17/2017)  Diagnostic/therapeutic left SI joint block x4 (06/17/2017)  Palliative bilateral trochanteric bursa injection x1 (12/11/2016)  Therapeutic right lumbar facet MB RFA x1 (08/07/2017)  Therapeutic left lumbar facet MB RFA x1 (09/16/2017)  Therapeutic right SI joint RFA x1 (08/07/2017)  Therapeutic left SI joint RFA x1 (09/16/2017)    Therapeutic  Palliative (PRN) options:   Palliative Racz procedure  Palliativebilateral lumbar facet block  Palliativebilateral SI joint block Palliativeright trochanteric bursa injection    Recent Visits Date Type Provider Dept  08/17/20 Procedure visit Delano Metz, MD [facility]  08/09/20 Office Visit Delano Metz, MD [facility]  07/11/20 Procedure visit Delano Metz, MD [facility]  06/28/20 Telemedicine Delano Metz, MD [facility]  Showing recent visits within past 90 days and meeting all  other requirements Today's Visits Date Type Provider Dept  09/13/20 Office Visit Delano Metz, MD [facility]  Showing today's visits and meeting all other requirements Future Appointments No visits were found meeting these conditions. Showing future appointments within next 90 days and meeting all other requirements  I discussed the assessment and treatment plan with the patient. The patient was provided an opportunity to ask questions and all were answered. The patient agreed with the plan and demonstrated an understanding of the instructions.  Patient advised to call back or seek an in-person evaluation if the symptoms or condition worsens.  Duration of encounter: 38 minutes.  Note by: Oswaldo Done, MD Date: 09/13/2020; Time: 9:44 AM

## 2020-09-13 ENCOUNTER — Other Ambulatory Visit: Payer: Self-pay

## 2020-09-13 ENCOUNTER — Encounter: Payer: Self-pay | Admitting: Pain Medicine

## 2020-09-13 ENCOUNTER — Ambulatory Visit: Payer: Medicare Other | Attending: Pain Medicine | Admitting: Pain Medicine

## 2020-09-13 VITALS — BP 119/83 | HR 97 | Temp 97.0°F | Resp 16 | Ht 60.0 in | Wt 149.0 lb

## 2020-09-13 DIAGNOSIS — G96198 Other disorders of meninges, not elsewhere classified: Secondary | ICD-10-CM

## 2020-09-13 DIAGNOSIS — M51369 Other intervertebral disc degeneration, lumbar region without mention of lumbar back pain or lower extremity pain: Secondary | ICD-10-CM

## 2020-09-13 DIAGNOSIS — M961 Postlaminectomy syndrome, not elsewhere classified: Secondary | ICD-10-CM

## 2020-09-13 DIAGNOSIS — M76892 Other specified enthesopathies of left lower limb, excluding foot: Secondary | ICD-10-CM | POA: Diagnosis present

## 2020-09-13 DIAGNOSIS — M779 Enthesopathy, unspecified: Secondary | ICD-10-CM | POA: Diagnosis present

## 2020-09-13 DIAGNOSIS — M5136 Other intervertebral disc degeneration, lumbar region with discogenic back pain only: Secondary | ICD-10-CM

## 2020-09-13 DIAGNOSIS — M533 Sacrococcygeal disorders, not elsewhere classified: Secondary | ICD-10-CM | POA: Insufficient documentation

## 2020-09-13 DIAGNOSIS — M545 Low back pain, unspecified: Secondary | ICD-10-CM

## 2020-09-13 DIAGNOSIS — M4802 Spinal stenosis, cervical region: Secondary | ICD-10-CM | POA: Insufficient documentation

## 2020-09-13 DIAGNOSIS — Z79899 Other long term (current) drug therapy: Secondary | ICD-10-CM

## 2020-09-13 DIAGNOSIS — G894 Chronic pain syndrome: Secondary | ICD-10-CM | POA: Diagnosis present

## 2020-09-13 DIAGNOSIS — M5412 Radiculopathy, cervical region: Secondary | ICD-10-CM | POA: Insufficient documentation

## 2020-09-13 DIAGNOSIS — M9904 Segmental and somatic dysfunction of sacral region: Secondary | ICD-10-CM | POA: Diagnosis present

## 2020-09-13 DIAGNOSIS — M5416 Radiculopathy, lumbar region: Secondary | ICD-10-CM | POA: Insufficient documentation

## 2020-09-13 DIAGNOSIS — F1120 Opioid dependence, uncomplicated: Secondary | ICD-10-CM

## 2020-09-13 DIAGNOSIS — G8929 Other chronic pain: Secondary | ICD-10-CM | POA: Diagnosis present

## 2020-09-13 DIAGNOSIS — M25552 Pain in left hip: Secondary | ICD-10-CM | POA: Diagnosis present

## 2020-09-13 DIAGNOSIS — M542 Cervicalgia: Secondary | ICD-10-CM | POA: Insufficient documentation

## 2020-09-13 MED ORDER — OXYCODONE-ACETAMINOPHEN 7.5-325 MG PO TABS
1.0000 | ORAL_TABLET | Freq: Every day | ORAL | 0 refills | Status: DC
Start: 2020-10-18 — End: 2020-10-07

## 2020-09-13 MED ORDER — OXYCODONE-ACETAMINOPHEN 7.5-325 MG PO TABS
1.0000 | ORAL_TABLET | Freq: Every day | ORAL | 0 refills | Status: DC
Start: 2020-09-18 — End: 2020-10-07

## 2020-09-13 MED ORDER — OXYCODONE-ACETAMINOPHEN 7.5-325 MG PO TABS: 1.0000 | ORAL_TABLET | Freq: Every day | ORAL | 0 refills | Status: DC

## 2020-09-13 NOTE — Progress Notes (Signed)
Nursing Pain Medication Assessment:  Safety precautions to be maintained throughout the outpatient stay will include: orient to surroundings, keep bed in low position, maintain call bell within reach at all times, provide assistance with transfer out of bed and ambulation.  Medication Inspection Compliance: Pill count conducted under aseptic conditions, in front of the patient. Neither the pills nor the bottle was removed from the patient's sight at any time. Once count was completed pills were immediately returned to the patient in their original bottle.  Medication: Oxycodone/APAP Pill/Patch Count: 27 of 150 pills remain Pill/Patch Appearance: Markings consistent with prescribed medication Bottle Appearance: Standard pharmacy container. Clearly labeled. Filled Date: 08/20/2020 Last Medication intake:  Today

## 2020-09-13 NOTE — Patient Instructions (Signed)
____________________________________________________________________________________________  Preparing for Procedure with Sedation  Procedure appointments are limited to planned procedures: . No Prescription Refills. . No disability issues will be discussed. . No medication changes will be discussed.  Instructions: . Oral Intake: Do not eat or drink anything for at least 8 hours prior to your procedure. (Exception: Blood Pressure Medication. See below.) . Transportation: Unless otherwise stated by your physician, you may drive yourself after the procedure. . Blood Pressure Medicine: Do not forget to take your blood pressure medicine with a sip of water the morning of the procedure. If your Diastolic (lower reading)is above 100 mmHg, elective cases will be cancelled/rescheduled. . Blood thinners: These will need to be stopped for procedures. Notify our staff if you are taking any blood thinners. Depending on which one you take, there will be specific instructions on how and when to stop it. . Diabetics on insulin: Notify the staff so that you can be scheduled 1st case in the morning. If your diabetes requires high dose insulin, take only  of your normal insulin dose the morning of the procedure and notify the staff that you have done so. . Preventing infections: Shower with an antibacterial soap the morning of your procedure. . Build-up your immune system: Take 1000 mg of Vitamin C with every meal (3 times a day) the day prior to your procedure. . Antibiotics: Inform the staff if you have a condition or reason that requires you to take antibiotics before dental procedures. . Pregnancy: If you are pregnant, call and cancel the procedure. . Sickness: If you have a cold, fever, or any active infections, call and cancel the procedure. . Arrival: You must be in the facility at least 30 minutes prior to your scheduled procedure. . Children: Do not bring children with you. . Dress appropriately:  Bring dark clothing that you would not mind if they get stained. . Valuables: Do not bring any jewelry or valuables.  Reasons to call and reschedule or cancel your procedure: (Following these recommendations will minimize the risk of a serious complication.) . Surgeries: Avoid having procedures within 2 weeks of any surgery. (Avoid for 2 weeks before or after any surgery). . Flu Shots: Avoid having procedures within 2 weeks of a flu shots or . (Avoid for 2 weeks before or after immunizations). . Barium: Avoid having a procedure within 7-10 days after having had a radiological study involving the use of radiological contrast. (Myelograms, Barium swallow or enema study). . Heart attacks: Avoid any elective procedures or surgeries for the initial 6 months after a "Myocardial Infarction" (Heart Attack). . Blood thinners: It is imperative that you stop these medications before procedures. Let us know if you if you take any blood thinner.  . Infection: Avoid procedures during or within two weeks of an infection (including chest colds or gastrointestinal problems). Symptoms associated with infections include: Localized redness, fever, chills, night sweats or profuse sweating, burning sensation when voiding, cough, congestion, stuffiness, runny nose, sore throat, diarrhea, nausea, vomiting, cold or Flu symptoms, recent or current infections. It is specially important if the infection is over the area that we intend to treat. . Heart and lung problems: Symptoms that may suggest an active cardiopulmonary problem include: cough, chest pain, breathing difficulties or shortness of breath, dizziness, ankle swelling, uncontrolled high or unusually low blood pressure, and/or palpitations. If you are experiencing any of these symptoms, cancel your procedure and contact your primary care physician for an evaluation.  Remember:  Regular Business hours are:    Monday to Thursday 8:00 AM to 4:00 PM  Provider's  Schedule: Mckaylin Bastien, MD:  Procedure days: Tuesday and Thursday 7:30 AM to 4:00 PM  Bilal Lateef, MD:  Procedure days: Monday and Wednesday 7:30 AM to 4:00 PM ____________________________________________________________________________________________   ____________________________________________________________________________________________  General Risks and Possible Complications  Patient Responsibilities: It is important that you read this as it is part of your informed consent. It is our duty to inform you of the risks and possible complications associated with treatments offered to you. It is your responsibility as a patient to read this and to ask questions about anything that is not clear or that you believe was not covered in this document.  Patient's Rights: You have the right to refuse treatment. You also have the right to change your mind, even after initially having agreed to have the treatment done. However, under this last option, if you wait until the last second to change your mind, you may be charged for the materials used up to that point.  Introduction: Medicine is not an exact science. Everything in Medicine, including the lack of treatment(s), carries the potential for danger, harm, or loss (which is by definition: Risk). In Medicine, a complication is a secondary problem, condition, or disease that can aggravate an already existing one. All treatments carry the risk of possible complications. The fact that a side effects or complications occurs, does not imply that the treatment was conducted incorrectly. It must be clearly understood that these can happen even when everything is done following the highest safety standards.  No treatment: You can choose not to proceed with the proposed treatment alternative. The "PRO(s)" would include: avoiding the risk of complications associated with the therapy. The "CON(s)" would include: not getting any of the treatment  benefits. These benefits fall under one of three categories: diagnostic; therapeutic; and/or palliative. Diagnostic benefits include: getting information which can ultimately lead to improvement of the disease or symptom(s). Therapeutic benefits are those associated with the successful treatment of the disease. Finally, palliative benefits are those related to the decrease of the primary symptoms, without necessarily curing the condition (example: decreasing the pain from a flare-up of a chronic condition, such as incurable terminal cancer).  General Risks and Complications: These are associated to most interventional treatments. They can occur alone, or in combination. They fall under one of the following six (6) categories: no benefit or worsening of symptoms; bleeding; infection; nerve damage; allergic reactions; and/or death. 1. No benefits or worsening of symptoms: In Medicine there are no guarantees, only probabilities. No healthcare provider can ever guarantee that a medical treatment will work, they can only state the probability that it may. Furthermore, there is always the possibility that the condition may worsen, either directly, or indirectly, as a consequence of the treatment. 2. Bleeding: This is more common if the patient is taking a blood thinner, either prescription or over the counter (example: Goody Powders, Fish oil, Aspirin, Garlic, etc.), or if suffering a condition associated with impaired coagulation (example: Hemophilia, cirrhosis of the liver, low platelet counts, etc.). However, even if you do not have one on these, it can still happen. If you have any of these conditions, or take one of these drugs, make sure to notify your treating physician. 3. Infection: This is more common in patients with a compromised immune system, either due to disease (example: diabetes, cancer, human immunodeficiency virus [HIV], etc.), or due to medications or treatments (example: therapies used to treat  cancer and   rheumatological diseases). However, even if you do not have one on these, it can still happen. If you have any of these conditions, or take one of these drugs, make sure to notify your treating physician. 4. Nerve Damage: This is more common when the treatment is an invasive one, but it can also happen with the use of medications, such as those used in the treatment of cancer. The damage can occur to small secondary nerves, or to large primary ones, such as those in the spinal cord and brain. This damage may be temporary or permanent and it may lead to impairments that can range from temporary numbness to permanent paralysis and/or brain death. 5. Allergic Reactions: Any time a substance or material comes in contact with our body, there is the possibility of an allergic reaction. These can range from a mild skin rash (contact dermatitis) to a severe systemic reaction (anaphylactic reaction), which can result in death. 6. Death: In general, any medical intervention can result in death, most of the time due to an unforeseen complication. ____________________________________________________________________________________________   

## 2020-09-15 DIAGNOSIS — M5021 Other cervical disc displacement,  high cervical region: Secondary | ICD-10-CM | POA: Insufficient documentation

## 2020-09-19 ENCOUNTER — Other Ambulatory Visit: Payer: Self-pay | Admitting: Neurosurgery

## 2020-10-03 NOTE — H&P (Signed)
Patient ID:   [id number] Patient: Jacqueline Thornton  Date of Birth: 06-19-1951 Visit Type: Office Visit   Date: 09/13/2020 12:15 PM Provider: Marchia Meiers. Vertell Limber MD   This 70 year old female presents for neck pain.  HISTORY OF PRESENT ILLNESS: 1.  neck pain  Patient returns reporting increased left trapezius pain, left-sided neck pain below the ear, and left-sided skull base pain.  She wishes to proceed with surgery as soon as possible, stating she is unable to wait until May as initially planned.  She acknowledges a surgical plan has not been discussed. Prescriptions are obtained from Dr. Dossie Arbour in [place] and include Percocet 7.5/325, taken 5 times daily, and Flexeril 5 mg taken 3 times daily.  She reports significant back pain relief with this regimen, but notes no left sided neck pain/shoulder pain relief.  Cervical MRI and x-ray December 2021 on [facility]  The patient is complaining of worsening pain.  She has a Spurling's maneuver which is markedly positive to the left.  She is complaining of pain in the distribution of left C4 nerve root.  She has much worse pain with flexion of her neck and changing of her neck positions.  The patient does not have weakness in her upper extremities on confrontational testing.  She has parascapular pain on the left.  She states that her pain is a 6/10 in severity without pain medicines.  The patient is quite frustrated with her continued complaints of pain.  She wants to get relief from this.      Medical/Surgical/Interim History Reviewed, no change.  Last detailed document date:08/05/2018.     PAST MEDICAL HISTORY, SURGICAL HISTORY, FAMILY HISTORY, SOCIAL HISTORY AND REVIEW OF SYSTEMS I have reviewed the patient's past medical, surgical, family and social history as well as the comprehensive review of systems as included on the [facility] history form dated 08/06/2018, which I have signed.  Family  History: Reviewed, no changes.  Last detailed document date:08/05/2018.   Social History: Reviewed, no changes. Last detailed document date: 08/05/2018.    MEDICATIONS: (added, continued or stopped this visit) Started Medication Directions Instruction Stopped  buspirone 10 mg tablet take 1 tablet by oral route 2 times every day    Cymbalta 60 mg capsule,delayed release take 1 capsule by oral route  every day   01/11/2019 diclofenac sodium 75 mg tablet,delayed release take 1 tablet by oral route 2 times every day    ESTROGEN  ORAL     gabapentin 300 mg capsule take 1 capsule by oral route 3 times every day   12/25/2018 Medrol (Pak) 4 mg tablets in a dose pack Use as directed   12/28/2018 methocarbamol 750 mg tablet take 1 tablet by oral route  every 8 hours    oxycodone-acetaminophen 5 mg-325 mg tablet take 1 tablet by oral route 5 times daily as directed   12/21/2018 Percocet 5 mg-325 mg tablet take 1 tablet by oral route  every 6 hours as needed for pain   07/27/2020 Percocet 7.5 mg-325 mg tablet take 1 tablet by oral route  every 6 hours as needed      ALLERGIES: Ingredient Reaction Medication Name Comment NO KNOWN ALLERGIES    No known allergies. Reviewed, no changes.    PHYSICAL EXAM:  Vitals Date Temp F BP Pulse Ht In Wt Lb BMI BSA Pain Score 09/13/2020  123/72 81 60.5 150.6 28.93  3/10     IMPRESSION:  Herniated cervical disc at C3-4 with cervical  stenosis at this level and a left C4 radiculopathy.  The patient has cervicalgia and left cervical radiculopathy  PLAN: I have recommended anterior cervical decompression and fusion at the C3-4 level with exploration of prior fusion C4 through C6 levels.  Risks and benefits were discussed in detail with patient and she wishes to proceed with surgery.  patient education was performed  Orders: Diagnostic Procedures: Assessment Procedure M54.12 Cervical  Spine- Lateral Instruction(s)/Education: Assessment Instruction I10 Lifestyle education [phone] Dietary management education, guidance, and counseling Miscellaneous: Assessment  M48.02 Soft Cervical Collar  Completed Orders (this encounter) Order Details Reason Side Interpretation Result Initial Treatment Date Region Lifestyle education Patient will follow up with Primary Care Physician.       Dietary management education, guidance, and counseling Encouraged patient to eat well balanced diet.        Assessment/Plan  # Detail Type Description  1. Assessment Herniated nucleus pulposus, C3-4 (M50.21).     2. Assessment Cervical spinal stenosis (M48.02).  Plan Orders Soft Cervical Collar.     3. Assessment Cervicalgia (M54.2).     4. Assessment Cervical radiculopathy (M54.12).     5. Assessment Essential (primary) hypertension (I10).     6. Assessment Body mass index (BMI) 28.0-28.9, adult (L46.50).  Plan Orders Today's instructions / counseling include(s) Dietary management education, guidance, and counseling. Clinical information/comments: Encouraged patient to eat well balanced diet.       Pain Management Plan Pain Scale: 3/10. Method: Numeric Pain Intensity Scale. Location: neck. Onset: 05/27/2018. Duration: varies. Quality: discomforting. Pain management follow-up plan of care: Patient will continue medication management..              Provider:  Marchia Meiers. Vertell Limber MD  09/15/2020 01:34 PM    Dictation edited by: Marchia Meiers. Vertell Limber    CC Providers: [place],  [place]  [id number]-   Robert Sypher  [facility] [address] [place], [place] [id number]-               Electronically signed by Marchia Meiers Vertell Limber MD on 09/15/2020 01:34 PM

## 2020-10-04 ENCOUNTER — Other Ambulatory Visit: Payer: Self-pay

## 2020-10-04 ENCOUNTER — Other Ambulatory Visit
Admission: RE | Admit: 2020-10-04 | Discharge: 2020-10-04 | Disposition: A | Discharge status: Home / Self Care | Payer: Medicare Other | Source: Ambulatory Visit | Attending: Neurosurgery | Admitting: Neurosurgery

## 2020-10-04 DIAGNOSIS — Z20822 Contact with and (suspected) exposure to covid-19: Secondary | ICD-10-CM | POA: Insufficient documentation

## 2020-10-04 DIAGNOSIS — Z01812 Encounter for preprocedural laboratory examination: Secondary | ICD-10-CM | POA: Diagnosis present

## 2020-10-05 ENCOUNTER — Encounter (HOSPITAL_COMMUNITY): Payer: Self-pay | Admitting: Neurosurgery

## 2020-10-05 LAB — SARS CORONAVIRUS 2 (TAT 6-24 HRS): SARS Coronavirus 2: NEGATIVE

## 2020-10-05 NOTE — Progress Notes (Signed)
Spoke with pt for pre-op call. Pt has hx of non-obstructive CAD. Pt states she does not have angina and never has had an MI. She states Dr. Humphrey Rolls in [place] is her cardiologist and her PCP is his wife. Pt states she is not diabetic.   Covid test done 10/04/20 and it's negative. Pt states she's been in quarantine since the test was done and understands that she stays in quarantine until she comes to the hospital tomorrow.

## 2020-10-06 ENCOUNTER — Ambulatory Visit (HOSPITAL_COMMUNITY): Payer: Medicare Other

## 2020-10-06 ENCOUNTER — Ambulatory Visit (HOSPITAL_COMMUNITY): Payer: Medicare Other | Admitting: Registered Nurse

## 2020-10-06 ENCOUNTER — Observation Stay (HOSPITAL_COMMUNITY)
Admission: RE | Admit: 2020-10-06 | Discharge: 2020-10-07 | Disposition: A | Discharge status: Home / Self Care | Payer: Medicare Other | Attending: Neurosurgery | Admitting: Neurosurgery

## 2020-10-06 ENCOUNTER — Encounter (HOSPITAL_COMMUNITY)
Admission: RE | Disposition: A | Discharge status: Home / Self Care | Payer: Self-pay | Source: Home / Self Care | Attending: Neurosurgery

## 2020-10-06 ENCOUNTER — Encounter (HOSPITAL_COMMUNITY): Payer: Self-pay | Admitting: Neurosurgery

## 2020-10-06 ENCOUNTER — Other Ambulatory Visit: Payer: Self-pay

## 2020-10-06 DIAGNOSIS — Z419 Encounter for procedure for purposes other than remedying health state, unspecified: Secondary | ICD-10-CM

## 2020-10-06 DIAGNOSIS — M5020 Other cervical disc displacement, unspecified cervical region: Secondary | ICD-10-CM | POA: Diagnosis present

## 2020-10-06 DIAGNOSIS — Z6828 Body mass index (BMI) 28.0-28.9, adult: Secondary | ICD-10-CM | POA: Insufficient documentation

## 2020-10-06 DIAGNOSIS — M542 Cervicalgia: Secondary | ICD-10-CM | POA: Diagnosis present

## 2020-10-06 DIAGNOSIS — I10 Essential (primary) hypertension: Secondary | ICD-10-CM | POA: Insufficient documentation

## 2020-10-06 DIAGNOSIS — M4802 Spinal stenosis, cervical region: Principal | ICD-10-CM | POA: Insufficient documentation

## 2020-10-06 DIAGNOSIS — E669 Obesity, unspecified: Secondary | ICD-10-CM | POA: Diagnosis not present

## 2020-10-06 DIAGNOSIS — Z79899 Other long term (current) drug therapy: Secondary | ICD-10-CM | POA: Insufficient documentation

## 2020-10-06 DIAGNOSIS — M5013 Cervical disc disorder with radiculopathy, cervicothoracic region: Secondary | ICD-10-CM | POA: Diagnosis not present

## 2020-10-06 HISTORY — PX: ANTERIOR CERVICAL DECOMP/DISCECTOMY FUSION: SHX1161

## 2020-10-06 HISTORY — DX: Constipation, unspecified: K59.00

## 2020-10-06 LAB — CBC
HCT: 35.3 % — ABNORMAL LOW (ref 36.0–46.0)
Hemoglobin: 11.2 g/dL — ABNORMAL LOW (ref 12.0–15.0)
MCH: 32.9 pg (ref 26.0–34.0)
MCHC: 31.7 g/dL (ref 30.0–36.0)
MCV: 103.8 fL — ABNORMAL HIGH (ref 80.0–100.0)
Platelets: 194 10*3/uL (ref 150–400)
RBC: 3.4 MIL/uL — ABNORMAL LOW (ref 3.87–5.11)
RDW: 13.4 % (ref 11.5–15.5)
WBC: 4.2 10*3/uL (ref 4.0–10.5)
nRBC: 0 % (ref 0.0–0.2)

## 2020-10-06 LAB — BASIC METABOLIC PANEL
Anion gap: 5 (ref 5–15)
BUN: 8 mg/dL (ref 8–23)
CO2: 27 mmol/L (ref 22–32)
Calcium: 8.6 mg/dL — ABNORMAL LOW (ref 8.9–10.3)
Chloride: 105 mmol/L (ref 98–111)
Creatinine, Ser: 0.92 mg/dL (ref 0.44–1.00)
GFR, Estimated: >60 mL/min (ref >60)
Glucose, Bld: 89 mg/dL (ref 70–99)
Potassium: 3.6 mmol/L (ref 3.5–5.1)
Sodium: 137 mmol/L (ref 135–145)

## 2020-10-06 LAB — TYPE AND SCREEN
ABO/RH(D): A POS
Antibody Screen: NEGATIVE

## 2020-10-06 SURGERY — ANTERIOR CERVICAL DECOMPRESSION/DISCECTOMY FUSION 1 LEVEL
Anesthesia: General | Site: Spine Cervical

## 2020-10-06 MED ORDER — MENTHOL 3 MG MT LOZG: 1.0000 | LOZENGE | OROMUCOSAL | Status: DC | PRN

## 2020-10-06 MED ORDER — GABAPENTIN 300 MG PO CAPS
300.0000 mg | ORAL_CAPSULE | Freq: Two times a day (BID) | ORAL | Status: DC
  Administered 2020-10-06: 300 mg via ORAL
  Filled 2020-10-06: qty 1

## 2020-10-06 MED ORDER — FENTANYL CITRATE (PF) 250 MCG/5ML IJ SOLN
INTRAMUSCULAR | Status: AC
  Filled 2020-10-06: qty 5

## 2020-10-06 MED ORDER — KETOROLAC TROMETHAMINE 15 MG/ML IJ SOLN
INTRAMUSCULAR | Status: AC
  Administered 2020-10-06: 7.5 mg via INTRAVENOUS
  Filled 2020-10-06: qty 1

## 2020-10-06 MED ORDER — MIDAZOLAM HCL 2 MG/2ML IJ SOLN
INTRAMUSCULAR | Status: AC
  Filled 2020-10-06: qty 2

## 2020-10-06 MED ORDER — VITAMIN B-12 1000 MCG PO TABS
5000.0000 ug | ORAL_TABLET | Freq: Every day | ORAL | Status: DC
  Administered 2020-10-06: 5000 ug via ORAL

## 2020-10-06 MED ORDER — CHLORHEXIDINE GLUCONATE CLOTH 2 % EX PADS: 6.0000 | MEDICATED_PAD | Freq: Once | CUTANEOUS | Status: DC

## 2020-10-06 MED ORDER — PANTOPRAZOLE SODIUM 40 MG PO TBEC: 40.0000 mg | DELAYED_RELEASE_TABLET | Freq: Every day | ORAL | Status: DC

## 2020-10-06 MED ORDER — MIDAZOLAM HCL 5 MG/5ML IJ SOLN
INTRAMUSCULAR | Status: DC | PRN
  Administered 2020-10-06: 2 mg via INTRAVENOUS

## 2020-10-06 MED ORDER — PROPOFOL 10 MG/ML IV BOLUS
INTRAVENOUS | Status: AC
  Filled 2020-10-06: qty 20

## 2020-10-06 MED ORDER — PANTOPRAZOLE SODIUM 40 MG IV SOLR
40.0000 mg | Freq: Every day | INTRAVENOUS | Status: DC
  Administered 2020-10-06: 40 mg via INTRAVENOUS
  Filled 2020-10-06: qty 40

## 2020-10-06 MED ORDER — CALCIUM CARBONATE-VITAMIN D 500-200 MG-UNIT PO TABS: 1.0000 | ORAL_TABLET | Freq: Every day | ORAL | Status: DC

## 2020-10-06 MED ORDER — METHOCARBAMOL 500 MG PO TABS
500.0000 mg | ORAL_TABLET | Freq: Four times a day (QID) | ORAL | Status: DC | PRN
Start: 2020-10-06 — End: 2020-10-07
  Administered 2020-10-06 – 2020-10-07 (×2): 500 mg via ORAL
  Filled 2020-10-06 (×2): qty 1

## 2020-10-06 MED ORDER — BISACODYL 10 MG RE SUPP: 10.0000 mg | Freq: Every day | RECTAL | Status: DC | PRN

## 2020-10-06 MED ORDER — ROCURONIUM BROMIDE 10 MG/ML (PF) SYRINGE
PREFILLED_SYRINGE | INTRAVENOUS | Status: DC | PRN
  Administered 2020-10-06: 100 mg via INTRAVENOUS

## 2020-10-06 MED ORDER — SODIUM CHLORIDE 0.9% FLUSH
3.0000 mL | Freq: Two times a day (BID) | INTRAVENOUS | Status: DC
  Administered 2020-10-06: 3 mL via INTRAVENOUS

## 2020-10-06 MED ORDER — ESTRADIOL 1 MG PO TABS
0.5000 mg | ORAL_TABLET | Freq: Every day | ORAL | Status: DC
  Administered 2020-10-06: 0.5 mg via ORAL
  Filled 2020-10-06: qty 0.5

## 2020-10-06 MED ORDER — ACETAMINOPHEN 650 MG RE SUPP: 650.0000 mg | RECTAL | Status: DC | PRN

## 2020-10-06 MED ORDER — OXYCODONE-ACETAMINOPHEN 7.5-325 MG PO TABS
1.0000 | ORAL_TABLET | Freq: Every day | ORAL | Status: DC
  Administered 2020-10-06 – 2020-10-07 (×3): 1 via ORAL
  Filled 2020-10-06 (×3): qty 1

## 2020-10-06 MED ORDER — CEFAZOLIN SODIUM-DEXTROSE 2-4 GM/100ML-% IV SOLN
2.0000 g | INTRAVENOUS | Status: AC
  Administered 2020-10-06: 2 g via INTRAVENOUS

## 2020-10-06 MED ORDER — OXYCODONE HCL 5 MG PO TABS
ORAL_TABLET | ORAL | Status: AC
  Administered 2020-10-06: 5 mg via ORAL
  Filled 2020-10-06: qty 1

## 2020-10-06 MED ORDER — KETOROLAC TROMETHAMINE 15 MG/ML IJ SOLN: 7.5000 mg | Freq: Four times a day (QID) | INTRAMUSCULAR | Status: DC

## 2020-10-06 MED ORDER — POLYETHYLENE GLYCOL 3350 17 G PO PACK: 17.0000 g | PACK | Freq: Every day | ORAL | Status: DC | PRN

## 2020-10-06 MED ORDER — ONDANSETRON HCL 4 MG/2ML IJ SOLN: 4.0000 mg | Freq: Four times a day (QID) | INTRAMUSCULAR | Status: DC | PRN

## 2020-10-06 MED ORDER — KETOROLAC TROMETHAMINE 15 MG/ML IJ SOLN
7.5000 mg | Freq: Four times a day (QID) | INTRAMUSCULAR | Status: DC
  Administered 2020-10-06 – 2020-10-07 (×2): 7.5 mg via INTRAVENOUS
  Filled 2020-10-06 (×2): qty 1

## 2020-10-06 MED ORDER — ONDANSETRON HCL 4 MG/2ML IJ SOLN
INTRAMUSCULAR | Status: DC | PRN
  Administered 2020-10-06: 4 mg via INTRAVENOUS

## 2020-10-06 MED ORDER — FENTANYL CITRATE (PF) 100 MCG/2ML IJ SOLN
25.0000 ug | INTRAMUSCULAR | Status: DC | PRN
  Administered 2020-10-06: 50 ug via INTRAVENOUS
  Administered 2020-10-06: 25 ug via INTRAVENOUS

## 2020-10-06 MED ORDER — PHENYLEPHRINE 40 MCG/ML (10ML) SYRINGE FOR IV PUSH (FOR BLOOD PRESSURE SUPPORT)
PREFILLED_SYRINGE | INTRAVENOUS | Status: DC | PRN
  Administered 2020-10-06: 80 ug via INTRAVENOUS

## 2020-10-06 MED ORDER — DOCUSATE SODIUM 100 MG PO CAPS
100.0000 mg | ORAL_CAPSULE | Freq: Two times a day (BID) | ORAL | Status: DC
  Administered 2020-10-06: 100 mg via ORAL
  Filled 2020-10-06: qty 1

## 2020-10-06 MED ORDER — THROMBIN 5000 UNITS EX SOLR: OROMUCOSAL | Status: DC | PRN

## 2020-10-06 MED ORDER — PHENYLEPHRINE 40 MCG/ML (10ML) SYRINGE FOR IV PUSH (FOR BLOOD PRESSURE SUPPORT)
PREFILLED_SYRINGE | INTRAVENOUS | Status: AC
  Filled 2020-10-06: qty 10

## 2020-10-06 MED ORDER — BEMPEDOIC ACID 180 MG PO TABS: 180.0000 mg | ORAL_TABLET | Freq: Every day | ORAL | Status: DC

## 2020-10-06 MED ORDER — PHENOL 1.4 % MT LIQD
1.0000 | OROMUCOSAL | Status: DC | PRN
  Administered 2020-10-06: 1 via OROMUCOSAL
  Filled 2020-10-06: qty 177

## 2020-10-06 MED ORDER — METHOCARBAMOL 500 MG PO TABS
ORAL_TABLET | ORAL | Status: AC
  Administered 2020-10-06: 500 mg via ORAL
  Filled 2020-10-06: qty 1

## 2020-10-06 MED ORDER — ONDANSETRON HCL 4 MG/2ML IJ SOLN
INTRAMUSCULAR | Status: AC
  Filled 2020-10-06: qty 2

## 2020-10-06 MED ORDER — FUROSEMIDE 20 MG PO TABS
20.0000 mg | ORAL_TABLET | Freq: Every day | ORAL | Status: DC
  Administered 2020-10-06: 20 mg via ORAL
  Filled 2020-10-06: qty 1

## 2020-10-06 MED ORDER — LIDOCAINE 2% (20 MG/ML) 5 ML SYRINGE
INTRAMUSCULAR | Status: DC | PRN
  Administered 2020-10-06: 60 mg via INTRAVENOUS

## 2020-10-06 MED ORDER — FLEET ENEMA 7-19 GM/118ML RE ENEM: 1.0000 | ENEMA | Freq: Once | RECTAL | Status: DC | PRN

## 2020-10-06 MED ORDER — HYDROMORPHONE HCL 1 MG/ML IJ SOLN: 0.5000 mg | INTRAMUSCULAR | Status: DC | PRN

## 2020-10-06 MED ORDER — ORAL CARE MOUTH RINSE: 15.0000 mL | Freq: Once | OROMUCOSAL | Status: AC

## 2020-10-06 MED ORDER — ADULT MULTIVITAMIN W/MINERALS CH
1.0000 | ORAL_TABLET | Freq: Every day | ORAL | Status: DC
  Administered 2020-10-06: 1 via ORAL
  Filled 2020-10-06: qty 1

## 2020-10-06 MED ORDER — CALCIUM 600-200 MG-UNIT PO TABS: ORAL_TABLET | Freq: Every day | ORAL | Status: DC

## 2020-10-06 MED ORDER — KCL IN DEXTROSE-NACL 30-5-0.45 MEQ/L-%-% IV SOLN
INTRAVENOUS | Status: DC
  Filled 2020-10-06: qty 1000

## 2020-10-06 MED ORDER — ALUM & MAG HYDROXIDE-SIMETH 200-200-20 MG/5ML PO SUSP: 30.0000 mL | Freq: Four times a day (QID) | ORAL | Status: DC | PRN

## 2020-10-06 MED ORDER — DEXAMETHASONE SODIUM PHOSPHATE 10 MG/ML IJ SOLN
INTRAMUSCULAR | Status: DC | PRN
  Administered 2020-10-06: 10 mg via INTRAVENOUS

## 2020-10-06 MED ORDER — DULOXETINE HCL 30 MG PO CPEP
60.0000 mg | ORAL_CAPSULE | Freq: Every day | ORAL | Status: DC
  Administered 2020-10-06: 60 mg via ORAL
  Filled 2020-10-06: qty 2

## 2020-10-06 MED ORDER — LIDOCAINE-EPINEPHRINE 1 %-1:100000 IJ SOLN
INTRAMUSCULAR | Status: AC
  Filled 2020-10-06: qty 1

## 2020-10-06 MED ORDER — HYDROCODONE-ACETAMINOPHEN 5-325 MG PO TABS
2.0000 | ORAL_TABLET | ORAL | Status: DC | PRN
  Administered 2020-10-06 – 2020-10-07 (×2): 2 via ORAL
  Filled 2020-10-06 (×2): qty 2

## 2020-10-06 MED ORDER — DEXAMETHASONE SODIUM PHOSPHATE 10 MG/ML IJ SOLN
INTRAMUSCULAR | Status: AC
  Filled 2020-10-06: qty 1

## 2020-10-06 MED ORDER — THROMBIN 5000 UNITS EX SOLR
CUTANEOUS | Status: AC
  Filled 2020-10-06: qty 5000

## 2020-10-06 MED ORDER — ENALAPRIL MALEATE 5 MG PO TABS
5.0000 mg | ORAL_TABLET | Freq: Every day | ORAL | Status: DC
  Administered 2020-10-06: 5 mg via ORAL
  Filled 2020-10-06 (×2): qty 1

## 2020-10-06 MED ORDER — METHOCARBAMOL 1000 MG/10ML IJ SOLN
500.0000 mg | Freq: Four times a day (QID) | INTRAVENOUS | Status: DC | PRN
  Filled 2020-10-06: qty 5

## 2020-10-06 MED ORDER — ZOLPIDEM TARTRATE 5 MG PO TABS: 5.0000 mg | ORAL_TABLET | Freq: Every evening | ORAL | Status: DC | PRN

## 2020-10-06 MED ORDER — CEFAZOLIN SODIUM-DEXTROSE 2-4 GM/100ML-% IV SOLN
INTRAVENOUS | Status: AC
  Filled 2020-10-06: qty 100

## 2020-10-06 MED ORDER — LACTATED RINGERS IV SOLN: INTRAVENOUS | Status: DC

## 2020-10-06 MED ORDER — CHLORHEXIDINE GLUCONATE 0.12 % MT SOLN: 15.0000 mL | Freq: Once | OROMUCOSAL | Status: AC

## 2020-10-06 MED ORDER — CEFAZOLIN SODIUM-DEXTROSE 2-4 GM/100ML-% IV SOLN
2.0000 g | Freq: Three times a day (TID) | INTRAVENOUS | Status: AC
  Administered 2020-10-06 – 2020-10-07 (×2): 2 g via INTRAVENOUS
  Filled 2020-10-06 (×2): qty 100

## 2020-10-06 MED ORDER — SODIUM CHLORIDE 0.9% FLUSH: 3.0000 mL | INTRAVENOUS | Status: DC | PRN

## 2020-10-06 MED ORDER — SODIUM CHLORIDE 0.9 % IV SOLN: 250.0000 mL | INTRAVENOUS | Status: DC

## 2020-10-06 MED ORDER — ONDANSETRON HCL 4 MG/2ML IJ SOLN: 4.0000 mg | Freq: Once | INTRAMUSCULAR | Status: DC | PRN

## 2020-10-06 MED ORDER — SERTRALINE HCL 50 MG PO TABS
50.0000 mg | ORAL_TABLET | Freq: Every day | ORAL | Status: DC
  Administered 2020-10-06: 50 mg via ORAL
  Filled 2020-10-06: qty 1

## 2020-10-06 MED ORDER — PROPOFOL 10 MG/ML IV BOLUS
INTRAVENOUS | Status: DC | PRN
  Administered 2020-10-06: 150 mg via INTRAVENOUS

## 2020-10-06 MED ORDER — CHLORHEXIDINE GLUCONATE 0.12 % MT SOLN
OROMUCOSAL | Status: AC
  Administered 2020-10-06: 15 mL via OROMUCOSAL
  Filled 2020-10-06: qty 15

## 2020-10-06 MED ORDER — BUPIVACAINE HCL (PF) 0.5 % IJ SOLN
INTRAMUSCULAR | Status: AC
  Filled 2020-10-06: qty 30

## 2020-10-06 MED ORDER — FENTANYL CITRATE (PF) 250 MCG/5ML IJ SOLN
INTRAMUSCULAR | Status: DC | PRN
  Administered 2020-10-06 (×3): 50 ug via INTRAVENOUS

## 2020-10-06 MED ORDER — LIDOCAINE 2% (20 MG/ML) 5 ML SYRINGE
INTRAMUSCULAR | Status: AC
  Filled 2020-10-06: qty 5

## 2020-10-06 MED ORDER — CYCLOBENZAPRINE HCL 10 MG PO TABS
10.0000 mg | ORAL_TABLET | Freq: Three times a day (TID) | ORAL | Status: DC | PRN
  Administered 2020-10-06 – 2020-10-07 (×2): 10 mg via ORAL
  Filled 2020-10-06 (×2): qty 1

## 2020-10-06 MED ORDER — SUGAMMADEX SODIUM 200 MG/2ML IV SOLN
INTRAVENOUS | Status: DC | PRN
  Administered 2020-10-06 (×2): 100 mg via INTRAVENOUS

## 2020-10-06 MED ORDER — BUPIVACAINE HCL (PF) 0.5 % IJ SOLN
INTRAMUSCULAR | Status: DC | PRN
  Administered 2020-10-06: 3.5 mL

## 2020-10-06 MED ORDER — VITAMIN B-12 5000 MCG SL SUBL: 5000.0000 ug | SUBLINGUAL_TABLET | Freq: Every day | SUBLINGUAL | Status: DC

## 2020-10-06 MED ORDER — ROCURONIUM BROMIDE 10 MG/ML (PF) SYRINGE
PREFILLED_SYRINGE | INTRAVENOUS | Status: AC
  Filled 2020-10-06: qty 10

## 2020-10-06 MED ORDER — 0.9 % SODIUM CHLORIDE (POUR BTL) OPTIME
TOPICAL | Status: DC | PRN
  Administered 2020-10-06: 1000 mL

## 2020-10-06 MED ORDER — OXYCODONE HCL 5 MG PO TABS
5.0000 mg | ORAL_TABLET | ORAL | Status: DC | PRN
Start: 2020-10-06 — End: 2020-10-07

## 2020-10-06 MED ORDER — ACETAMINOPHEN 325 MG PO TABS: 650.0000 mg | ORAL_TABLET | ORAL | Status: DC | PRN

## 2020-10-06 MED ORDER — MELATONIN 5 MG PO TABS: 10.0000 mg | ORAL_TABLET | Freq: Every evening | ORAL | Status: DC | PRN

## 2020-10-06 MED ORDER — LIDOCAINE-EPINEPHRINE 1 %-1:100000 IJ SOLN
INTRAMUSCULAR | Status: DC | PRN
  Administered 2020-10-06: 3.5 mL

## 2020-10-06 MED ORDER — FENTANYL CITRATE (PF) 100 MCG/2ML IJ SOLN
INTRAMUSCULAR | Status: AC
  Administered 2020-10-06: 25 ug via INTRAVENOUS
  Filled 2020-10-06: qty 2

## 2020-10-06 MED ORDER — ONDANSETRON HCL 4 MG PO TABS: 4.0000 mg | ORAL_TABLET | Freq: Four times a day (QID) | ORAL | Status: DC | PRN

## 2020-10-06 SURGICAL SUPPLY — 70 items
ADH SKN CLS APL DERMABOND .7 (GAUZE/BANDAGES/DRESSINGS) ×1
BAND INSRT 18 STRL LF DISP RB (MISCELLANEOUS) ×2
BAND RUBBER #18 3X1/16 STRL (MISCELLANEOUS) ×4 IMPLANT
BASKET BONE COLLECTION (BASKET) IMPLANT
BIT DRILL NEURO 2X3.1 SFT TUCH (MISCELLANEOUS) ×1 IMPLANT
BIT DRILL OZARK 12 (BIT) IMPLANT
BLADE ULTRA TIP 2M (BLADE) IMPLANT
BNDG GAUZE ELAST 4 BULKY (GAUZE/BANDAGES/DRESSINGS) IMPLANT
BUR BARREL STRAIGHT FLUTE 4.0 (BURR) ×2 IMPLANT
CANISTER SUCT 3000ML PPV (MISCELLANEOUS) ×2 IMPLANT
CARTRIDGE OIL MAESTRO DRILL (MISCELLANEOUS) ×1 IMPLANT
CNTNR URN SCR LID CUP LEK RST (MISCELLANEOUS) IMPLANT
CONT SPEC 4OZ STRL OR WHT (MISCELLANEOUS) ×4
COVER MAYO STAND STRL (DRAPES) ×2 IMPLANT
COVER WAND RF STERILE (DRAPES) ×1 IMPLANT
DECANTER SPIKE VIAL GLASS SM (MISCELLANEOUS) ×1 IMPLANT
DERMABOND ADVANCED (GAUZE/BANDAGES/DRESSINGS) ×1
DERMABOND ADVANCED .7 DNX12 (GAUZE/BANDAGES/DRESSINGS) ×1 IMPLANT
DIFFUSER DRILL AIR PNEUMATIC (MISCELLANEOUS) ×2 IMPLANT
DRAIN JACKSON PRATT 10MM FLAT (MISCELLANEOUS) ×1 IMPLANT
DRAPE HALF SHEET 40X57 (DRAPES) IMPLANT
DRAPE LAPAROTOMY 100X72 PEDS (DRAPES) ×2 IMPLANT
DRAPE MICROSCOPE LEICA (MISCELLANEOUS) ×2 IMPLANT
DRILL NEURO 2X3.1 SOFT TOUCH (MISCELLANEOUS) ×2
DRILL OZARK 12 (BIT) ×2
DRSG OPSITE POSTOP 3X4 (GAUZE/BANDAGES/DRESSINGS) ×1 IMPLANT
DURAPREP 6ML APPLICATOR 50/CS (WOUND CARE) ×2 IMPLANT
ELECT COATED BLADE 2.86 ST (ELECTRODE) ×2 IMPLANT
ELECT REM PT RETURN 9FT ADLT (ELECTROSURGICAL) ×2
ELECTRODE REM PT RTRN 9FT ADLT (ELECTROSURGICAL) ×1 IMPLANT
EVACUATOR SILICONE 100CC (DRAIN) ×2 IMPLANT
GAUZE 4X4 16PLY RFD (DISPOSABLE) IMPLANT
GAUZE SPONGE 4X4 12PLY STRL (GAUZE/BANDAGES/DRESSINGS) IMPLANT
GLOVE BIO SURGEON STRL SZ8 (GLOVE) ×3 IMPLANT
GLOVE BIOGEL PI IND STRL 8.5 (GLOVE) ×1 IMPLANT
GLOVE BIOGEL PI INDICATOR 8.5 (GLOVE) ×2
GLOVE ECLIPSE 8.0 STRL XLNG CF (GLOVE) ×3 IMPLANT
GLOVE EXAM NITRILE XL STR (GLOVE) IMPLANT
GLOVE SRG 8 PF TXTR STRL LF DI (GLOVE) ×1 IMPLANT
GLOVE SURG PR MICRO ENCORE 7 (GLOVE) ×4 IMPLANT
GLOVE SURG UNDER POLY LF SZ8 (GLOVE) ×2
GOWN STRL REUS W/ TWL LRG LVL3 (GOWN DISPOSABLE) ×1 IMPLANT
GOWN STRL REUS W/ TWL XL LVL3 (GOWN DISPOSABLE) IMPLANT
GOWN STRL REUS W/TWL 2XL LVL3 (GOWN DISPOSABLE) ×2 IMPLANT
GOWN STRL REUS W/TWL LRG LVL3 (GOWN DISPOSABLE) ×2
GOWN STRL REUS W/TWL XL LVL3 (GOWN DISPOSABLE) ×4
HALTER HD/CHIN CERV TRACTION D (MISCELLANEOUS) ×2 IMPLANT
HEMOSTAT POWDER KIT SURGIFOAM (HEMOSTASIS) ×2 IMPLANT
KIT BASIN OR (CUSTOM PROCEDURE TRAY) ×2 IMPLANT
KIT TURNOVER KIT B (KITS) ×2 IMPLANT
NDL HYPO 25X1 1.5 SAFETY (NEEDLE) ×1 IMPLANT
NDL SPNL 22GX3.5 QUINCKE BK (NEEDLE) ×1 IMPLANT
NEEDLE HYPO 25X1 1.5 SAFETY (NEEDLE) ×2 IMPLANT
NEEDLE SPNL 22GX3.5 QUINCKE BK (NEEDLE) ×2 IMPLANT
NS IRRIG 1000ML POUR BTL (IV SOLUTION) ×2 IMPLANT
OIL CARTRIDGE MAESTRO DRILL (MISCELLANEOUS) ×2
PACK LAMINECTOMY NEURO (CUSTOM PROCEDURE TRAY) ×2 IMPLANT
PAD ARMBOARD 7.5X6 YLW CONV (MISCELLANEOUS) ×6 IMPLANT
PEEK AVS AS 6X12X4 (Peek) ×1 IMPLANT
PIN DISTRACTION 14MM (PIN) ×4 IMPLANT
PLATE CERV CONS OZARK 1X24 (Plate) ×1 IMPLANT
SCREW CERV ST OZARK 4.5X12 (Screw) ×4 IMPLANT
SCREW CERV ST OZARK 4X12 (Screw) ×2 IMPLANT
SPONGE INTESTINAL PEANUT (DISPOSABLE) ×2 IMPLANT
SPONGE SURGIFOAM ABS GEL SZ50 (HEMOSTASIS) IMPLANT
STAPLER SKIN PROX WIDE 3.9 (STAPLE) IMPLANT
SUT VIC AB 3-0 SH 8-18 (SUTURE) ×4 IMPLANT
TOWEL GREEN STERILE (TOWEL DISPOSABLE) ×2 IMPLANT
TOWEL GREEN STERILE FF (TOWEL DISPOSABLE) ×2 IMPLANT
WATER STERILE IRR 1000ML POUR (IV SOLUTION) ×2 IMPLANT

## 2020-10-06 NOTE — Op Note (Signed)
10/06/2020  2:52 PM  PATIENT:  Jacqueline Thornton  70 y.o. female  PRE-OPERATIVE DIAGNOSIS:  Cervical spinal stenosis, cervical disc herniation, cervical radiculopathy, cervical myelopathy, cervicalgia  POST-OPERATIVE DIAGNOSIS:  Cervical spinal stenosis, cervical disc herniation, cervical radiculopathy, cervical myelopathy, cervicalgia   PROCEDURE:  Procedure(s): Cervical three -four Anterior cervical decompression/discectomy/fusion  with possible anchor c and exploration of fusion Cervical four to Six (N/A) with removal of hardware C 4 - C 6 levels  SURGEON:  Surgeon(s) and Role:    Erline Levine, MD - Primary  PHYSICIAN ASSISTANT: Glenford Peers, NP  ASSISTANTS: [place], RN   ANESTHESIA:   general  EBL:  25 mL   BLOOD ADMINISTERED:none  DRAINS: (10) Jackson-Pratt drain(s) with closed bulb suction in the prevertebral space   LOCAL MEDICATIONS USED:  MARCAINE    and LIDOCAINE   SPECIMEN:  No Specimen  DISPOSITION OF SPECIMEN:  N/A  COUNTS:  YES  TOURNIQUET:  * No tourniquets in log *  DICTATION: Patient is 70 year old female with stenosis and HNP of C3 on 4 with myelopathy,and radiculopathy and cervicalgia.  It was elected to take her to surgery for anterior cervical decompression and fusion C 34 level with exploration of C 4 - C 6 fusion.  PROCEDURE: Patient was brought to operating room and following the smooth and uncomplicated induction of general endotracheal anesthesia her head was placed on a horseshoe head holder she was placed in 5 pounds of Holter traction and her anterior neck was prepped and draped in usual sterile fashion. An incision was made on the left side of midline after infiltrating the skin and subcutaneous tissues with local lidocaine. The platysmal layer was incised and subplatysmal dissection was performed exposing the anterior border sternocleidomastoid muscle. Using blunt dissection the carotid sheath was kept lateral and trachea and esophagus kept medial  exposing the anterior cervical spine. The previously placed plate was exposed and removed.  The bony interfaces appeared solid without evidence of pseudoarthrosis.  [place] retractor was placed along with up-down retractor.  A bent spinal needle was placed it was felt to be the C 34 level and this was confirmed on intraoperative x-ray. Longus coli muscles were taken down from the anterior cervical spine using electrocautery and key elevator and self-retaining retractor was placed exposing the C 34 level. The interspace was incised and a thorough discectomy was performed. Distraction pins were placed. Uncinate spurs and central spondylitic ridges were drilled down with a high-speed drill. The spinal cord dura and both C4 nerve roots were widely decompressed. Hemostasis was assured. After trial sizing a 6 mm lordotic small footprint PEEK cage was selected and packed with local autograft. This was tamped into position and countersunk appropriately. Distraction weight was removed. A 24 mm [place] anterior cervical plate was affixed to the cervical spine with 12 mm variable-angle screws 2 at C 3 (4.0 mm), 2 at C 4 (4.5 mm) . All screws were well-positioned and locking mechanisms were engaged. A final X ray was obtained which showed well positioned graft and anterior plate without complicating features. Soft tissues were inspected and found to be in good repair. The wound was irrigated. A # 10 JP drain was inserted through a separate stab incision.  The platysma layer was closed with 3-0 Vicryl stitches and the skin was reapproximated with 3-0 Vicryl subcuticular stitches. The wound was dressed with Dermabond and an occlusive dressing. Counts were correct at the end of the case. Patient was extubated and taken to recovery  in stable and satisfactory condition.   PLAN OF CARE: Admit for overnight observation  PATIENT DISPOSITION:  PACU - hemodynamically stable.   Delay start of Pharmacological VTE agent (>24hrs) due  to surgical blood loss or risk of bleeding: yes

## 2020-10-06 NOTE — Transfer of Care (Signed)
Immediate Anesthesia Transfer of Care Note  Patient: Jacqueline Thornton  Procedure(s) Performed: Cervical three -four Anterior cervical decompression/discectomy/fusion  with possible anchor c and exploration of fusion Cervical four to Six (N/A Spine Cervical)  Patient Location: PACU  Anesthesia Type:General  Level of Consciousness: awake, alert  and oriented  Airway & Oxygen Therapy: Patient Spontanous Breathing and Patient connected to face mask oxygen  Post-op Assessment: Report given to RN and Post -op Vital signs reviewed and stable  Post vital signs: Reviewed and stable  Last Vitals:  Vitals Value Taken Time  BP 134/65 10/06/20 1434  Temp    Pulse 100 10/06/20 1437  Resp 14 10/06/20 1437  SpO2 100 % 10/06/20 1437  Vitals shown include unvalidated device data.  Last Pain:  Vitals:   10/06/20 0955  TempSrc:   PainSc: 5       Patients Stated Pain Goal: 2 (79/03/83 [id number])  Complications: No complications documented.

## 2020-10-06 NOTE — Progress Notes (Signed)
Orthopedic Tech Progress Note Patient Details:  Jacqueline Thornton 23-May-1951 [id number] Patient has brace Patient ID: Jacqueline Thornton, female   DOB: 02-Nov-1950, 70 y.o.   MRN: [id number]   [name] 10/06/2020, 5:26 PM

## 2020-10-06 NOTE — Progress Notes (Signed)
Awake, alert, conversant.  MAEW with full strength (bilateral D/B/T/HI).  Patient is not terribly sore and appears to be doing well.

## 2020-10-06 NOTE — Anesthesia Preprocedure Evaluation (Addendum)
Anesthesia Evaluation  Patient identified by MRN, date of birth, ID band Patient awake    Reviewed: Allergy & Precautions, NPO status , Patient's Chart, lab work & pertinent test results  Airway Mallampati: II  TM Distance: >3 FB Neck ROM: Limited    Dental  (+) Edentulous Upper, Edentulous Lower   Pulmonary former smoker,  Quit smoking 2005, never heavy smoker per pt    Pulmonary exam normal breath sounds clear to auscultation       Cardiovascular hypertension, Pt. on medications Normal cardiovascular exam Rhythm:Regular Rate:Normal  161/66 in preop- took ACE I yesterday, this BP is high for her per pt  Takes lasix for LE swelling  Per pt had a heart murmur a few years ago and had echo that was normal in [place]    Neuro/Psych  Headaches, PSYCHIATRIC DISORDERS Anxiety Depression    GI/Hepatic Neg liver ROS, GERD  Medicated and Controlled,  Endo/Other  negative endocrine ROS  Renal/GU negative Renal ROS  negative genitourinary   Musculoskeletal  (+) Arthritis , Osteoarthritis,  Fibromyalgia -, narcotic dependentChronic LBP Percocet 7.5/325 five times/d   Abdominal   Peds  Hematology  (+) Blood dyscrasia, anemia , hct 35.3   Anesthesia Other Findings   Reproductive/Obstetrics negative OB ROS                            Anesthesia Physical Anesthesia Plan  ASA: III  Anesthesia Plan: General   Post-op Pain Management:    Induction: Intravenous  PONV Risk Score and Plan: 3 and Ondansetron, Dexamethasone, Midazolam and Treatment may vary due to age or medical condition  Airway Management Planned: Oral ETT and Video Laryngoscope Planned  Additional Equipment: None  Intra-op Plan:   Post-operative Plan: Extubation in OR  Informed Consent: I have reviewed the patients History and Physical, chart, labs and discussed the procedure including the risks, benefits and alternatives  for the proposed anesthesia with the patient or authorized representative who has indicated his/her understanding and acceptance.     Dental advisory given  Plan Discussed with: CRNA  Anesthesia Plan Comments:        Anesthesia Quick Evaluation

## 2020-10-06 NOTE — Anesthesia Procedure Notes (Signed)
Procedure Name: Intubation Date/Time: 10/06/2020 12:37 PM Performed by: Trinna Post., CRNA Pre-anesthesia Checklist: Patient identified, Emergency Drugs available, Suction available, Patient being monitored and Timeout performed Patient Re-evaluated:Patient Re-evaluated prior to induction Oxygen Delivery Method: Circle system utilized Preoxygenation: Pre-oxygenation with 100% oxygen Induction Type: IV induction Ventilation: Mask ventilation without difficulty Laryngoscope Size: Glidescope and 3 Grade View: Grade I Tube type: Oral Tube size: 7.0 mm Number of attempts: 1 Airway Equipment and Method: Rigid stylet and Video-laryngoscopy Placement Confirmation: ETT inserted through vocal cords under direct vision,  breath sounds checked- equal and bilateral and positive ETCO2 Secured at: 22 cm Tube secured with: Tape Dental Injury: Teeth and Oropharynx as per pre-operative assessment

## 2020-10-06 NOTE — Anesthesia Postprocedure Evaluation (Signed)
Anesthesia Post Note  Patient: REBEL LAUGHRIDGE  Procedure(s) Performed: Cervical three -four Anterior cervical decompression/discectomy/fusion  with possible anchor c and exploration of fusion Cervical four to Six (N/A Spine Cervical)     Patient location during evaluation: PACU Anesthesia Type: General Level of consciousness: awake and alert Pain management: pain level controlled Vital Signs Assessment: post-procedure vital signs reviewed and stable Respiratory status: spontaneous breathing, nonlabored ventilation, respiratory function stable and patient connected to nasal cannula oxygen Cardiovascular status: blood pressure returned to baseline and stable Postop Assessment: no apparent nausea or vomiting Anesthetic complications: no   No complications documented.  Last Vitals:  Vitals:   10/06/20 1630 10/06/20 1713  BP: (!) 142/61 (!) 140/97  Pulse: 90 97  Resp: 13 18  Temp: 36.6 C 36.7 C  SpO2: 100% 98%    Last Pain:  Vitals:   10/06/20 1713  TempSrc: Oral  PainSc:                  March Rummage Daden Mahany

## 2020-10-06 NOTE — Interval H&P Note (Signed)
History and Physical Interval Note:  10/06/2020 9:59 AM  Jacqueline Thornton  has presented today for surgery, with the diagnosis of Cervical spinal stenosis.  The various methods of treatment have been discussed with the patient and family. After consideration of risks, benefits and other options for treatment, the patient has consented to  Procedure(s) with comments: Cervical 3-4 Anterior cervical decompression/discectomy/fusion  with possible anchor c and exploration of fusion Cervical 4 to 6 (N/A) - 3C/RM 21 as a surgical intervention.  The patient's history has been reviewed, patient examined, no change in status, stable for surgery.  I have reviewed the patient's chart and labs.  Questions were answered to the patient's satisfaction.     [name]

## 2020-10-06 NOTE — Brief Op Note (Signed)
10/06/2020  2:52 PM  PATIENT:  Jacqueline Thornton  70 y.o. female  PRE-OPERATIVE DIAGNOSIS:  Cervical spinal stenosis, cervical disc herniation, cervical radiculopathy, cervical myelopathy, cervicalgia  POST-OPERATIVE DIAGNOSIS:  Cervical spinal stenosis, cervical disc herniation, cervical radiculopathy, cervical myelopathy, cervicalgia   PROCEDURE:  Procedure(s): Cervical three -four Anterior cervical decompression/discectomy/fusion  with possible anchor c and exploration of fusion Cervical four to Six (N/A) with removal of hardware C 4 - C 6 levels  SURGEON:  Surgeon(s) and Role:    Erline Levine, MD - Primary  PHYSICIAN ASSISTANT: Glenford Peers, NP  ASSISTANTS: [place], RN   ANESTHESIA:   general  EBL:  25 mL   BLOOD ADMINISTERED:none  DRAINS: (10) Jackson-Pratt drain(s) with closed bulb suction in the prevertebral space   LOCAL MEDICATIONS USED:  MARCAINE    and LIDOCAINE   SPECIMEN:  No Specimen  DISPOSITION OF SPECIMEN:  N/A  COUNTS:  YES  TOURNIQUET:  * No tourniquets in log *  DICTATION: Patient is 70 year old female with stenosis and HNP of C3 on 4 with myelopathy,and radiculopathy and cervicalgia.  It was elected to take her to surgery for anterior cervical decompression and fusion C 34 level with exploration of C 4 - C 6 fusion.  PROCEDURE: Patient was brought to operating room and following the smooth and uncomplicated induction of general endotracheal anesthesia her head was placed on a horseshoe head holder she was placed in 5 pounds of Holter traction and her anterior neck was prepped and draped in usual sterile fashion. An incision was made on the left side of midline after infiltrating the skin and subcutaneous tissues with local lidocaine. The platysmal layer was incised and subplatysmal dissection was performed exposing the anterior border sternocleidomastoid muscle. Using blunt dissection the carotid sheath was kept lateral and trachea and esophagus kept medial  exposing the anterior cervical spine. The previously placed plate was exposed and removed.  The bony interfaces appeared solid without evidence of pseudoarthrosis.  Bixby retractor was placed along with up-down retractor.  A bent spinal needle was placed it was felt to be the C 34 level and this was confirmed on intraoperative x-ray. Longus coli muscles were taken down from the anterior cervical spine using electrocautery and key elevator and self-retaining retractor was placed exposing the C 34 level. The interspace was incised and a thorough discectomy was performed. Distraction pins were placed. Uncinate spurs and central spondylitic ridges were drilled down with a high-speed drill. The spinal cord dura and both C4 nerve roots were widely decompressed. Hemostasis was assured. After trial sizing a 6 mm lordotic small footprint PEEK cage was selected and packed with local autograft. This was tamped into position and countersunk appropriately. Distraction weight was removed. A 24 mm [place] anterior cervical plate was affixed to the cervical spine with 12 mm variable-angle screws 2 at C 3 (4.0 mm), 2 at C 4 (4.5 mm) . All screws were well-positioned and locking mechanisms were engaged. A final X ray was obtained which showed well positioned graft and anterior plate without complicating features. Soft tissues were inspected and found to be in good repair. The wound was irrigated. A # 10 JP drain was inserted through a separate stab incision.  The platysma layer was closed with 3-0 Vicryl stitches and the skin was reapproximated with 3-0 Vicryl subcuticular stitches. The wound was dressed with Dermabond and an occlusive dressing. Counts were correct at the end of the case. Patient was extubated and taken to recovery  in stable and satisfactory condition.   PLAN OF CARE: Admit for overnight observation  PATIENT DISPOSITION:  PACU - hemodynamically stable.   Delay start of Pharmacological VTE agent (>24hrs) due  to surgical blood loss or risk of bleeding: yes

## 2020-10-07 DIAGNOSIS — M4802 Spinal stenosis, cervical region: Secondary | ICD-10-CM | POA: Diagnosis not present

## 2020-10-07 MED ORDER — OXYCODONE-ACETAMINOPHEN 5-325 MG PO TABS: 1.0000 | ORAL_TABLET | Freq: Four times a day (QID) | ORAL | 0 refills | Status: DC | PRN

## 2020-10-07 MED ORDER — METHOCARBAMOL 500 MG PO TABS: 500.0000 mg | ORAL_TABLET | Freq: Four times a day (QID) | ORAL | 0 refills | Status: DC | PRN

## 2020-10-07 NOTE — Evaluation (Signed)
Occupational Therapy Evaluation Patient Details Name: Jacqueline Thornton MRN: [id number] DOB: 10/20/1950 Today's Date: 10/07/2020    History of Present Illness Pt is a 70 y.o. F s/p C3-4 ACDF with exploration of C4-6 fusion. Significant PMH: prior spinal surgeries.   Clinical Impression   Pt PTA: pt living with spouse and reports independence with ADL and mobility. Pt currently, modified independence with ADL and mobility with ability to abide by precautions. Pt stating all precautions. Cervical handout provided and reviewed ADL in detail. Pt educated on: set an alarm at night for medication, avoid sitting for long periods of time, correct bed positioning for sleeping, correct sequence for bed mobility, avoiding lifting more than 5 pounds and never wash directly over incision. All education is complete and patient indicates understanding.Pt does not require continued OT skilled services. OT signing off.      Follow Up Recommendations  No OT follow up    Equipment Recommendations  None recommended by OT    Recommendations for Other Services       Precautions / Restrictions Precautions Precautions: Cervical Precaution Booklet Issued: Yes (comment) Precaution Comments: Verbally reviewed, provided written handout Required Braces or Orthoses: Cervical Brace Cervical Brace: Soft collar Restrictions Weight Bearing Restrictions: No      Mobility Bed Mobility Overal bed mobility: Needs Assistance Bed Mobility: Sidelying to Sit;Rolling Rolling: Supervision Sidelying to sit: Supervision       General bed mobility comments: supervisionA for log roll technique    Transfers Overall transfer level: Independent Equipment used: None                  Balance Overall balance assessment: Mild deficits observed, not formally tested                                         ADL either performed or assessed with clinical judgement   ADL Overall ADL's : At  baseline;Modified independent                                       General ADL Comments: Pt using figure 4 technique for EOB ADL tasks- use of reacher education for LB ADL. Pt standing at sink for grooming and transferring to commode with no physical assist and no UE supported. Pt reports that she has the support of her spouse after d/c.     Vision Baseline Vision/History: Wears glasses Wears Glasses: At all times Patient Visual Report: No change from baseline Vision Assessment?: No apparent visual deficits     Perception     Praxis      Pertinent Vitals/Pain Pain Assessment: Faces Faces Pain Scale: Hurts little more Pain Location: surgical site, L hip Pain Descriptors / Indicators: Aching;Operative site guarding Pain Intervention(s): Monitored during session     Hand Dominance Right   Extremity/Trunk Assessment Upper Extremity Assessment Upper Extremity Assessment: Overall WFL for tasks assessed   Lower Extremity Assessment Lower Extremity Assessment: Overall WFL for tasks assessed   Cervical / Trunk Assessment Cervical / Trunk Assessment: Other exceptions Cervical / Trunk Exceptions: s/p ACDF   Communication Communication Communication: No difficulties   Cognition Arousal/Alertness: Awake/alert Behavior During Therapy: WFL for tasks assessed/performed Overall Cognitive Status: Within Functional Limits for tasks assessed  General Comments       Exercises     Shoulder Instructions      Home Living Family/patient expects to be discharged to:: Private residence Living Arrangements: Spouse/significant other Available Help at Discharge: Family Type of Home: House Home Access: Stairs to enter Technical brewer of Steps: 2 Entrance Stairs-Rails: Right;Left Home Layout: One level     Bathroom Shower/Tub: [place]: Kasandra Knudsen - single point;Walker - 2  wheels;Shower seat          Prior Functioning/Environment Level of Independence: Independent                 OT Problem List: Decreased activity tolerance      OT Treatment/Interventions:      OT Goals(Current goals can be found in the care plan section) Acute Rehab OT Goals Patient Stated Goal: be able to walk on the beach OT Goal Formulation: All assessment and education complete, DC therapy Potential to Achieve Goals: Good  OT Frequency:     Barriers to D/C:            Co-evaluation              AM-PAC OT "6 Clicks" Daily Activity     Outcome Measure Help from another person eating meals?: None Help from another person taking care of personal grooming?: None Help from another person toileting, which includes using toliet, bedpan, or urinal?: None Help from another person bathing (including washing, rinsing, drying)?: A Little Help from another person to put on and taking off regular upper body clothing?: None Help from another person to put on and taking off regular lower body clothing?: None 6 Click Score: 23   End of Session Nurse Communication: Mobility status  Activity Tolerance: Patient tolerated treatment well Patient left: in chair;with call bell/phone within reach  OT Visit Diagnosis: Unsteadiness on feet (R26.81);Muscle weakness (generalized) (M62.81)                Time: 3009-2330 OT Time Calculation (min): 15 min Charges:  OT General Charges $OT Visit: 1 Visit OT Evaluation $OT Eval Moderate Complexity: 1 Mod Jefferey Pica, OTR/L Acute Rehabilitation Services Pager: [phone] Office: [phone]   Walfred Bettendorf C 10/07/2020, 1:20 PM

## 2020-10-07 NOTE — Progress Notes (Signed)
Subjective: Patient reports that she is doing well and is pleased with her postoperative status. Her only complaint is of left-sided upper back pain. No acute events overnight.   Objective: Vital signs in last 24 hours: Temp:  [97.8 F (36.6 C)-98.9 F (37.2 C)] 98.4 F (36.9 C) (04/09 0745) Pulse Rate:  [80-101] 89 (04/09 0745) Resp:  [13-19] 16 (04/09 0745) BP: (101-161)/(59-97) 101/86 (04/09 0745) SpO2:  [90 %-100 %] 100 % (04/09 0745) Weight:  [67.6 kg] 67.6 kg (04/08 0937)  Intake/Output from previous day: 04/08 0701 - 04/09 0700 In: 1200 [I.V.:1100; IV Piggyback:100] Out: 60 [Drains:35; Blood:25] Intake/Output this shift: No intake/output data recorded.  Physical Exam: Patient is awake, A/O X 4, conversant, and in good spirits. Speech is fluent and appropriate. Doing well. MAEW with good strength. Right HI 5/5, Left HI 4+/5 Dressing is clean dry intact. Incision is well approximated with no drainage, erythema, or edema. Drain has been removed. Soft cervical collar in place.      Lab Results: Recent Labs    10/06/20 0937  WBC 4.2  HGB 11.2*  HCT 35.3*  PLT 194   BMET Recent Labs    10/06/20 0937  NA 137  K 3.6  CL 105  CO2 27  GLUCOSE 89  BUN 8  CREATININE 0.92  CALCIUM 8.6*    Studies/Results: DG Cervical Spine 2-3 Views  Result Date: 10/06/2020 CLINICAL DATA:  C3-4 ACDF. EXAM: CERVICAL SPINE - 2-3 VIEW COMPARISON:  07/05/2020 FINDINGS: Two intraoperative cross-table lateral radiographs of the cervical spine are provided. On the first image, the anterior fusion plate and screws from previous C4-C6 ACDF have been removed and the tip of a bent needle projects over the anterior aspect of the C3-4 disc space. On the second image, ACDF has been performed at C3-4 with placement of an anterior fusion plate, screws, and interbody spacer. Prior posterior fusion is again noted at C5-6. IMPRESSION: Intraoperative radiographs during C3-4 ACDF. Electronically Signed    By: Logan Bores M.D.   On: 10/06/2020 15:54    Assessment/Plan: Patient is post-op day 1 s/p C3-4 anterior cervical decompression/diskectomy/fusion. She is recovering well and reports a significant reduction of her preoperative symptoms.  Her only complaint is mild incisional, left shoulder and left upper back discomfort.  She has ambulated with nursing staff.  She is awaiting PT/OT evaluation. Continue soft cervical collar. Continue working on pain control, mobility and ambulating patient. Will plan for discharge today.    LOS: 0 days     Marvis Moeller, DNP, NP-C 10/07/2020, 8:13 AM

## 2020-10-07 NOTE — Plan of Care (Signed)
Patient is discharged from room 3C07 at this time. Alert and in stable condition. IV site d/c'd and instructions read to patient with understanding verbalized and all questions answered. Left unit via wheelchair with all belongings at side. 

## 2020-10-07 NOTE — Evaluation (Signed)
Physical Therapy Evaluation Patient Details Name: Jacqueline Thornton MRN: [id number] DOB: [birth year] Today's Date: 10/07/2020   History of Present Illness  Pt is a 70 y.o. F s/p C3-4 ACDF with exploration of C4-6 fusion. Significant PMH: prior spinal surgeries.  Clinical Impression  Patient evaluated by Physical Therapy with no further acute PT needs identified. Pt denies radicular pain or numbness/tingling; upon palpation, demonstrates increased left upper trapezius tightness in comparison to right. Pt ambulating x 400 feet with no assistive device and negotiated 3 steps with a railing without physical assist. Pt reporting mild left hip pain towards end of walk which is chronic in setting of bursitis. Education provided regarding cervical precautions and activity recommendations. All education has been completed and the patient has no further questions. No follow-up Physical Therapy or equipment needs. PT is signing off. Thank you for this referral.    Follow Up Recommendations No PT follow up;Supervision - Intermittent    Equipment Recommendations  None recommended by PT    Recommendations for Other Services       Precautions / Restrictions Precautions Precautions: Cervical Precaution Booklet Issued: Yes (comment) Precaution Comments: Verbally reviewed, provided written handout Required Braces or Orthoses: Cervical Brace Cervical Brace: Soft collar Restrictions Weight Bearing Restrictions: No      Mobility  Bed Mobility               General bed mobility comments: Sitting EOB upon arrival    Transfers Overall transfer level: Independent Equipment used: None                Ambulation/Gait Ambulation/Gait assistance: Modified independent (Device/Increase time) Gait Distance (Feet): 400 Feet Assistive device: None Gait Pattern/deviations: Step-through pattern;Antalgic Gait velocity: decreased   General Gait Details: Mildly antalgic gait pattern with increased  distance, pt reports history of L hip bursitis  Stairs Stairs: Yes Stairs assistance: Modified independent (Device/Increase time) Stair Management: One rail Left Number of Stairs: 3 General stair comments: Step over step pattern  Wheelchair Mobility    Modified Rankin (Stroke Patients Only)       Balance Overall balance assessment: Mild deficits observed, not formally tested                                           Pertinent Vitals/Pain Pain Assessment: Faces Faces Pain Scale: Hurts little more Pain Location: surgical site, L hip Pain Descriptors / Indicators: Aching;Operative site guarding Pain Intervention(s): Monitored during session    Home Living Family/patient expects to be discharged to:: Private residence Living Arrangements: Spouse/significant other Available Help at Discharge: Family Type of Home: House Home Access: Stairs to enter Entrance Stairs-Rails: Psychiatric nurse of Steps: 2 Home Layout: One level Home Equipment: Cane - single point;Walker - 2 wheels;Shower seat      Prior Function Level of Independence: Independent               Hand Dominance   Dominant Hand: Right    Extremity/Trunk Assessment   Upper Extremity Assessment Upper Extremity Assessment: Defer to OT evaluation    Lower Extremity Assessment Lower Extremity Assessment: RLE deficits/detail;LLE deficits/detail RLE Deficits / Details: Strength 5/5 LLE Deficits / Details: Strength 5/5    Cervical / Trunk Assessment Cervical / Trunk Assessment: Other exceptions Cervical / Trunk Exceptions: s/p ACDF  Communication   Communication: No difficulties  Cognition Arousal/Alertness: Awake/alert Behavior During Therapy: WFL for  tasks assessed/performed Overall Cognitive Status: Within Functional Limits for tasks assessed                                        General Comments      Exercises     Assessment/Plan    PT  Assessment Patent does not need any further PT services  PT Problem List         PT Treatment Interventions      PT Goals (Current goals can be found in the Care Plan section)  Acute Rehab PT Goals Patient Stated Goal: be able to walk on the beach PT Goal Formulation: All assessment and education complete, DC therapy    Frequency     Barriers to discharge        Co-evaluation               AM-PAC PT "6 Clicks" Mobility  Outcome Measure Help needed turning from your back to your side while in a flat bed without using bedrails?: None Help needed moving from lying on your back to sitting on the side of a flat bed without using bedrails?: None Help needed moving to and from a bed to a chair (including a wheelchair)?: None Help needed standing up from a chair using your arms (e.g., wheelchair or bedside chair)?: None Help needed to walk in hospital room?: None Help needed climbing 3-5 steps with a railing? : None 6 Click Score: 24    End of Session Equipment Utilized During Treatment: Cervical collar;Gait belt Activity Tolerance: Patient tolerated treatment well Patient left: in bed;with call bell/phone within reach Nurse Communication: Mobility status PT Visit Diagnosis: Difficulty in walking, not elsewhere classified (R26.2);Pain Pain - part of body:  (neck)    Time: 5681-2751 PT Time Calculation (min) (ACUTE ONLY): 18 min   Charges:   PT Evaluation $PT Eval Low Complexity: [place], PT, DPT Acute Rehabilitation Services Pager [phone] Office [phone]   [name] 10/07/2020, 8:32 AM

## 2020-10-07 NOTE — Discharge Instructions (Addendum)
Wound Care Leave incision open to air. You may shower. Do not scrub directly on incision.  Do not put any creams, lotions, or ointments on incision. Activity Walk each and every day, increasing distance each day. No lifting greater than 5 lbs.  Avoid excessive neck motion. No driving for 2 weeks; may ride as a passenger locally. Wear neck brace at all times except when showering.  If provided soft collar, may wear for comfort unless otherwise instructed. Diet Resume your normal diet.  Return to Work Will be discussed at you follow up appointment. Call Your Doctor If Any of These Occur Redness, drainage, or swelling at the wound.  Temperature greater than 101 degrees. Severe pain not relieved by pain medication. Increased difficulty swallowing. Incision starts to come apart. Follow Up Appt Call today for appointment in 2 weeks ([phone]) or for problems.  If you have any hardware placed in your spine, you will need an x-ray before your appointment.

## 2020-10-07 NOTE — Discharge Summary (Signed)
Physician Discharge Summary  Patient ID: Jacqueline Thornton MRN: [id number] DOB/AGE: 1950-10-18 70 y.o.  Admit date: 10/06/2020 Discharge date: 10/07/2020  Admission Diagnoses:Cervical spinal stenosis, cervical disc herniation, cervical radiculopathy, cervical myelopathy, cervicalgia  Discharge Diagnoses: Cervical spinal stenosis, cervical disc herniation, cervical radiculopathy, cervical myelopathy, cervicalgia Active Problems:   Herniated cervical disc without myelopathy   Discharged Condition: good  Hospital Course: The patient was admitted on 10/07/2020 and taken to the operating room where the patient underwent decompression and fusion. The patient tolerated the procedure well and was taken to the recovery room and then to the floor in stable condition. The hospital course was routine. There were no complications. The wound remained clean dry and intact. Pt had appropriate back soreness. No complaints of leg pain or new N/T/W. The patient remained afebrile with stable vital signs, and tolerated a regular diet. The patient continued to increase activities, and pain was well controlled with oral pain medications.  Consults: None  Significant Diagnostic Studies: radiology: X-ray  Treatments: surgery: Cervical three -four Anterior cervical decompression/discectomy/fusion  with possible anchor c and exploration of fusion Cervical four to Six (N/A) with removal of hardware C 4 - C 6 levels  Discharge Exam: Blood pressure 101/86, pulse 89, temperature 98.4 F (36.9 C), temperature source Oral, resp. rate 16, height 5' (1.524 m), weight 67.6 kg, SpO2 100 %.  Physical Exam: Patient is awake, A/O X 4, conversant, and in good spirits. Speech is fluent and appropriate. Doing well. MAEW with good strength. Right HI 5/5, Left HI 4+/5 Dressing is clean dry intact. Incision is well approximated with no drainage, erythema, or edema. Drain has been removed. Soft cervical collar in place.  Disposition:  Discharge disposition: 01-Home or Self Care        Allergies as of 10/07/2020   No Known Allergies     Medication List    STOP taking these medications   cyclobenzaprine 10 MG tablet Commonly known as: FLEXERIL   oxyCODONE-acetaminophen 7.5-325 MG tablet Commonly known as: Percocet Replaced by: oxyCODONE-acetaminophen 5-325 MG tablet     TAKE these medications   aspirin EC 81 MG tablet Take 81 mg by mouth daily. Swallow whole.   CALCIUM PO Take 1 tablet by mouth daily.   DULoxetine 60 MG capsule Commonly known as: CYMBALTA Take 60 mg by mouth at bedtime.   enalapril 5 MG tablet Commonly known as: VASOTEC Take 5 mg by mouth daily.   esomeprazole 20 MG capsule Commonly known as: NEXIUM Take 20 mg by mouth at bedtime.   estradiol 0.5 MG tablet Commonly known as: ESTRACE Take 0.5 mg by mouth at bedtime.   furosemide 20 MG tablet Commonly known as: LASIX Take 20 mg by mouth daily.   gabapentin 300 MG capsule Commonly known as: NEURONTIN Take 1 capsule (300 mg total) by mouth 2 (two) times daily.   Melatonin 10 MG Tabs Take 10 mg by mouth at bedtime as needed (for sleep).   methocarbamol 500 MG tablet Commonly known as: ROBAXIN Take 1 tablet (500 mg total) by mouth every 6 (six) hours as needed for muscle spasms.   multivitamin with minerals Tabs tablet Take 1 tablet by mouth daily.   Nexletol 180 MG Tabs Generic drug: Bempedoic Acid Take 180 mg by mouth daily.   oxyCODONE-acetaminophen 5-325 MG tablet Commonly known as: Percocet Take 1-2 tablets by mouth every 6 (six) hours as needed for severe pain. Replaces: oxyCODONE-acetaminophen 7.5-325 MG tablet   sertraline 100 MG tablet Commonly known  as: ZOLOFT Take 50 mg by mouth daily.   Vitamin B-12 5000 MCG Subl Place 1 tablet (5,000 mcg total) under the tongue daily.        Signed: Marvis Moeller, DNP, NP-C 10/07/2020, 8:25 AM

## 2020-10-09 ENCOUNTER — Encounter (HOSPITAL_COMMUNITY): Payer: Self-pay | Admitting: Neurosurgery

## 2020-11-27 NOTE — Progress Notes (Signed)
PROVIDER NOTE: Information contained herein reflects review and annotations entered in association with encounter. Interpretation of such information and data should be left to medically-trained personnel. Information provided to patient can be located elsewhere in the medical record under "Patient Instructions". Document created using STT-dictation technology, any transcriptional errors that may result from process are unintentional.    Patient: Jacqueline Thornton  Service Category: Procedure  Provider: Gaspar Cola, MD  DOB: 24-Sep-1950  DOS: 11/28/2020  Location: [facility]  MRN: [id number]  Setting: Ambulatory - outpatient  Referring Provider: Perrin Maltese, MD  Type: Established Patient  Specialty: Interventional Pain Management  PCP: Perrin Maltese, MD   Primary Reason for Visit: Interventional Pain Management Treatment. CC: Back Pain  Procedure #1:  Anesthesia, Analgesia, Anxiolysis:  Type: Diagnostic Sacroiliac Joint Steroid Injection #2  Region: Superior Lumbosacral Region Level: PSIS (Posterior Superior Iliac Spine) Laterality: Right  Type: Moderate (Conscious) Sedation combined with Local Anesthesia Indication(s): Analgesia and Anxiety Route: Intravenous (IV) IV Access: Secured Sedation: Meaningful verbal contact was maintained at all times during the procedure  Local Anesthetic: Lidocaine 1-2%  Position: Prone           Indications: 1. Chronic sacroiliac joint pain (Right)   2. Other specified dorsopathies, sacral and sacrococcygeal region   3. Somatic dysfunction of sacroiliac joint (Right)   4. Spondylosis without myelopathy or radiculopathy, sacral and sacrococcygeal region   5. Enthesopathy of sacroiliac joint (Right)    Procedure #2:    Type: Intra-Articular Hip Injection #4  Primary Purpose: Therapeutic Region: Posterolateral hip joint area. Level: Lower pelvic and hip joint level. Target Area: Superior aspect of the hip joint cavity, going  thru the superior portion of the capsular ligament. Approach: Posterolateral approach. Laterality: Right  Position: Prone Prepped Area: Entire Posterolateral hip area. DuraPrep (Iodine Povacrylex [0.7% available iodine] and Isopropyl Alcohol, 74% w/w)   Indications: 1. Chronic hip pain (Right)   2. Osteoarthritis of hip (Right)   3. 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 is a 70 y.o. (year old), female patient, seen today for interventional treatment. She  has a past surgical history that includes Cholecystectomy; Abdominal hysterectomy; Neck surgery; Shoulder surgery; Colonoscopy with propofol (N/A, 07/19/2015); caract surger; Laparoscopic salpingo oophorectomy (Bilateral, 03/03/2018); Cystoscopy (03/03/2018); Tonsillectomy; Appendectomy; Tumor removal; Anterior lumbar fusion (N/A, 12/11/2018); Abdominal exposure (N/A, 12/11/2018); and Anterior cervical decomp/discectomy fusion (N/A, 10/06/2020). [name] has a current medication list which includes the following prescription(s): nexletol, calcium, duloxetine, enalapril, esomeprazole, estradiol, furosemide, melatonin, methocarbamol, multivitamin with minerals, oxycodone-acetaminophen, sertraline, aspirin ec, vitamin b-12, and gabapentin, and the following Facility-Administered Medications: fentanyl, iopamidol, and midazolam. Her primarily concern today is the Back Pain  Initial Vital Signs:  Pulse/HCG Rate: 80ECG Heart Rate: 71 Temp: (!) 97.3 F (36.3 C) Resp: 17 BP: 97/60 SpO2: 97 %  BMI: Estimated body mass index is 29.1 kg/m as calculated from the following:   Height as of this encounter: 5' (1.524 m).   Weight as of this encounter: 149 lb (67.6 kg).  Risk Assessment: Allergies: Reviewed. She has No Known Allergies.  Allergy Precautions: None required Coagulopathies: Reviewed. None identified.  Blood-thinner  therapy: None at this time Active Infection(s): Reviewed. None identified. [name] is afebrile  Site Confirmation: [name] was asked to confirm the procedure and laterality before marking the site Procedure checklist: Completed Consent: Before the  procedure and under the influence of no sedative(s), amnesic(s), or anxiolytics, the patient was informed of the treatment options, risks and possible complications. To fulfill our ethical and legal obligations, as recommended by the American Medical Association's Code of Ethics, I have informed the patient of my clinical impression; the nature and purpose of the treatment or procedure; the risks, benefits, and possible complications of the intervention; the alternatives, including doing nothing; the risk(s) and benefit(s) of the alternative treatment(s) or procedure(s); and the risk(s) and benefit(s) of doing nothing. The patient was provided information about the general risks and possible complications associated with the procedure. These may include, but are not limited to: failure to achieve desired goals, infection, bleeding, organ or nerve damage, allergic reactions, paralysis, and death. In addition, the patient was informed of those risks and complications associated to the procedure, such as failure to decrease pain; infection; bleeding; organ or nerve damage with subsequent damage to sensory, motor, and/or autonomic systems, resulting in permanent pain, numbness, and/or weakness of one or several areas of the body; allergic reactions; (i.e.: anaphylactic reaction); and/or death. Furthermore, the patient was informed of those risks and complications associated with the medications. These include, but are not limited to: allergic reactions (i.e.: anaphylactic or anaphylactoid reaction(s)); adrenal axis suppression; blood sugar elevation that in diabetics may result in ketoacidosis or comma; water retention that in patients with history of  congestive heart failure may result in shortness of breath, pulmonary edema, and decompensation with resultant heart failure; weight gain; swelling or edema; medication-induced neural toxicity; particulate matter embolism and blood vessel occlusion with resultant organ, and/or nervous system infarction; and/or aseptic necrosis of one or more joints. Finally, the patient was informed that Medicine is not an exact science; therefore, there is also the possibility of unforeseen or unpredictable risks and/or possible complications that may result in a catastrophic outcome. The patient indicated having understood very clearly. We have given the patient no guarantees and we have made no promises. Enough time was given to the patient to ask questions, all of which were answered to the patient's satisfaction. Ms. Mccreery has indicated that she wanted to continue with the procedure. Attestation: I, the ordering provider, attest that I have discussed with the patient the benefits, risks, side-effects, alternatives, likelihood of achieving goals, and potential problems during recovery for the procedure that I have provided informed consent. Date  Time: 11/28/2020  9:22 AM  Pre-Procedure Preparation:  Monitoring: As per clinic protocol. Respiration, ETCO2, SpO2, BP, heart rate and rhythm monitor placed and checked for adequate function Safety Precautions: Patient was assessed for positional comfort and pressure points before starting the procedure. Time-out: I initiated and conducted the "Time-out" before starting the procedure, as per protocol. The patient was asked to participate by confirming the accuracy of the "Time Out" information. Verification of the correct person, site, and procedure were performed and confirmed by me, the nursing staff, and the patient. "Time-out" conducted as per Joint Commission's Universal Protocol (UP.01.01.01). Time: 1125  Description of Procedure #1:  Target Area: Superior,  posterior, aspect of the sacroiliac fissure Approach: Posterior, paraspinal, ipsilateral approach. Area Prepped: Entire Lower Lumbosacral Region DuraPrep (Iodine Povacrylex [0.7% available iodine] and Isopropyl Alcohol, 74% w/w) Safety Precautions: Aspiration looking for blood return was conducted prior to all injections. At no point did we inject any substances, as a needle was being advanced. No attempts were made at seeking any paresthesias. Safe injection practices and needle disposal techniques used. Medications properly checked for expiration dates.  SDV (single dose vial) medications used. Description of the Procedure: Protocol guidelines were followed. The patient was placed in position over the procedure table. The target area was identified and the area prepped in the usual manner. Skin & deeper tissues infiltrated with local anesthetic. Appropriate amount of time allowed to pass for local anesthetics to take effect. The procedure needle was advanced under fluoroscopic guidance into the sacroiliac joint until a firm endpoint was obtained. Proper needle placement secured. Negative aspiration confirmed. Solution injected in intermittent fashion, asking for systemic symptoms every 0.5cc of injectate. The needles were then removed and the area cleansed, making sure to leave some of the prepping solution back to take advantage of its long term bactericidal properties. Vitals:   11/28/20 1130 11/28/20 1142 11/28/20 1152 11/28/20 1202  BP: 116/80 101/61 (!) 112/52 101/72  Pulse:      Resp: 12 14 14 15   Temp:  (!) 97.2 F (36.2 C)  (!) 97.2 F (36.2 C)  SpO2: 100% 94% 100% 100%  Weight:      Height:        Start Time: 1125 hrs. End Time: 1130 hrs. Materials:  Needle(s) Type: Spinal Needle Gauge: 22G Length: 5.0-in Medication(s): Please see orders for medications and dosing details.  Imaging Guidance (Non-Spinal) for procedure #1:  Type of Imaging Technique: Fluoroscopy Guidance  (Non-Spinal) Indication(s): Assistance in needle guidance and placement for procedures requiring needle placement in or near specific anatomical locations not easily accessible without such assistance. Exposure Time: Please see nurses notes. Contrast: Before injecting any contrast, we confirmed that the patient did not have an allergy to iodine, shellfish, or radiological contrast. Once satisfactory needle placement was completed at the desired level, radiological contrast was injected. Contrast injected under live fluoroscopy. No contrast complications. See chart for type and volume of contrast used. Fluoroscopic Guidance: I was personally present during the use of fluoroscopy. "Tunnel Vision Technique" used to obtain the best possible view of the target area. Parallax error corrected before commencing the procedure. "Direction-depth-direction" technique used to introduce the needle under continuous pulsed fluoroscopy. Once target was reached, antero-posterior, oblique, and lateral fluoroscopic projection used confirm needle placement in all planes. Images permanently stored in EMR. Interpretation: I personally interpreted the imaging intraoperatively. Adequate needle placement confirmed in multiple planes. Appropriate spread of contrast into desired area was observed. No evidence of afferent or efferent intravascular uptake. Permanent images saved into the patient's record.  Description of Procedure #2:  Safety Precautions: Aspiration looking for blood return was conducted prior to all injections. At no point did we inject any substances, as a needle was being advanced. No attempts were made at seeking any paresthesias. Safe injection practices and needle disposal techniques used. Medications properly checked for expiration dates. SDV (single dose vial) medications used. Description of the Procedure: Protocol guidelines were followed. The patient was placed in position over the fluoroscopy table. The  target area was identified and the area prepped in the usual manner. Skin & deeper tissues infiltrated with local anesthetic. Appropriate amount of time allowed to pass for local anesthetics to take effect. The procedure needles were then advanced to the target area. Proper needle placement secured. Negative aspiration confirmed. Solution injected in intermittent fashion, asking for systemic symptoms every 0.5cc of injectate. The needles were then removed and the area cleansed, making sure to leave some of the prepping solution back to take advantage of its long term bactericidal properties. Vitals:   11/28/20 1130 11/28/20 1142 11/28/20 1152 11/28/20 1202  BP: 116/80 101/61 (!) 112/52 101/72  Pulse:      Resp: 12 14 14 15   Temp:  (!) 97.2 F (36.2 C)  (!) 97.2 F (36.2 C)  SpO2: 100% 94% 100% 100%  Weight:      Height:        Start Time: 1125 hrs. End Time: 1130 hrs. Materials:  Needle(s) Type: Spinal Needle Gauge: 22G Length: 7.0-in Medication(s): Please see orders for medications and dosing details.  Imaging Guidance (Non-Spinal) for procedure #2:  Type of Imaging Technique: Fluoroscopy Guidance (Non-Spinal) Indication(s): Assistance in needle guidance and placement for procedures requiring needle placement in or near specific anatomical locations not easily accessible without such assistance. Exposure Time: Please see nurses notes. Contrast: Before injecting any contrast, we confirmed that the patient did not have an allergy to iodine, shellfish, or radiological contrast. Once satisfactory needle placement was completed at the desired level, radiological contrast was injected. Contrast injected under live fluoroscopy. No contrast complications. See chart for type and volume of contrast used. Fluoroscopic Guidance: I was personally present during the use of fluoroscopy. "Tunnel Vision Technique" used to obtain the best possible view of the target area. Parallax error corrected before  commencing the procedure. "Direction-depth-direction" technique used to introduce the needle under continuous pulsed fluoroscopy. Once target was reached, antero-posterior, oblique, and lateral fluoroscopic projection used confirm needle placement in all planes. Images permanently stored in EMR. Interpretation: I personally interpreted the imaging intraoperatively. Adequate needle placement confirmed in multiple planes. Appropriate spread of contrast into desired area was observed. No evidence of afferent or efferent intravascular uptake. Permanent images saved into the patient's record.  Antibiotic Prophylaxis:   Anti-infectives (From admission, onward)   None     Indication(s): None identified  Post-operative Assessment:  Post-procedure Vital Signs:  Pulse/HCG Rate: 8070 Temp: (!) 97.2 F (36.2 C) Resp: 15 BP: 101/72 SpO2: 100 %  EBL: None  Complications: No immediate post-treatment complications observed by team, or reported by patient.  Note: The patient tolerated the entire procedure well. A repeat set of vitals were taken after the procedure and the patient was kept under observation following institutional policy, for this type of procedure. Post-procedural neurological assessment was performed, showing return to baseline, prior to discharge. The patient was provided with post-procedure discharge instructions, including a section on how to identify potential problems. Should any problems arise concerning this procedure, the patient was given instructions to immediately contact us, at any time, without hesitation. In any case, we plan to contact the patient by telephone for a follow-up status report regarding this interventional procedure.  Comments:  No additional relevant information.  Plan of Care  Orders:  Orders Placed This Encounter  Procedures  . HIP INJECTION    Purpose: Therapeutic/Diagnostic Indication: Hip pain 2ry to Trochanteric Burlitis right (M70.61).     Scheduling Instructions:     Procedure: Trochanteric bursa injection     Laterality: Right-sided     Sedation: With Sedation.     Timeframe: Today  . SACROILIAC JOINT INJECTION    Scheduling Instructions:     Side: Right-sided     Sedation: Patient's choice.     Timeframe: Today    Order Specific Question:   Where will this procedure be performed?    Answer:   [facility]  . DG PAIN [facility] C-ARM 1-60 MIN NO REPORT    Intraoperative interpretation by procedural physician at [place].    Standing Status:   Standing  Number of Occurrences:   1    Order Specific Question:   Reason for exam:    Answer:   Assistance in needle guidance and placement for procedures requiring needle placement in or near specific anatomical locations not easily accessible without such assistance.  . Provide equipment / supplies at bedside    "Block Tray" (Disposable  single use) Needle type: SpinalSpinal Amount/quantity: 1 Size: Regular (3.5-inch) Gauge: 22G    Standing Status:   Standing    Number of Occurrences:   1    Order Specific Question:   Specify    Answer:   Block Tray  . Informed Consent Details: Physician/Practitioner Attestation; Transcribe to consent form and obtain patient signature    Nursing Order: Transcribe to consent form and obtain patient signature. Note: Always confirm laterality of pain with Ms. Shupert, before procedure.    Order Specific Question:   Physician/Practitioner attestation of informed consent for procedure/surgical case    Answer:   I, the physician/practitioner, attest that I have discussed with the patient the benefits, risks, side effects, alternatives, likelihood of achieving goals and potential problems during recovery for the procedure that I have provided informed consent.    Order Specific Question:   Procedure    Answer:   Sacroiliac Joint Block    Order Specific Question:   Physician/Practitioner performing the procedure    Answer:    Melissa Tomaselli A. Dossie Arbour, MD    Order Specific Question:   Indication/Reason    Answer:   Chronic Low Back and Hip Pain secondary to Sacroiliac Joint Pain (Arthralgia/Arthropathy)  . Informed Consent Details: Physician/Practitioner Attestation; Transcribe to consent form and obtain patient signature    Note: Always confirm laterality of pain with Ms. Cardella, before procedure. Transcribe to consent form and obtain patient signature.    Order Specific Question:   Physician/Practitioner attestation of informed consent for procedure/surgical case    Answer:   I, the physician/practitioner, attest that I have discussed with the patient the benefits, risks, side effects, alternatives, likelihood of achieving goals and potential problems during recovery for the procedure that I have provided informed consent.    Order Specific Question:   Procedure    Answer:   Hip bursa injection    Order Specific Question:   Physician/Practitioner performing the procedure    Answer:   Chrystian Cupples A. Dossie Arbour, MD    Order Specific Question:   Indication/Reason    Answer:   Hip bursitis   Chronic Opioid Analgesic:  Oxycodone/APAP 7.5/325, 1 tab PO q 5 times a day. (56.25 mg/day of oxycodone) MME/day:56.25 mg/day.   Medications ordered for procedure: Meds ordered this encounter  Medications  . lidocaine (XYLOCAINE) 2 % (with pres) injection 400 mg  . lactated ringers infusion 1,000 mL  . midazolam (VERSED) 5 MG/5ML injection 1-2 mg    Make sure Flumazenil is available in the pyxis when using this medication. If oversedation occurs, administer 0.2 mg IV over 15 sec. If after 45 sec no response, administer 0.2 mg again over 1 min; may repeat at 1 min intervals; not to exceed 4 doses (1 mg)  . fentaNYL (SUBLIMAZE) injection 25-50 mcg    Make sure Narcan is available in the pyxis when using this medication. In the event of respiratory depression (RR< 8/min): Titrate NARCAN (naloxone) in increments of 0.1 to 0.2 mg IV at  2-3 minute intervals, until desired degree of reversal.  . methylPREDNISolone acetate (DEPO-MEDROL) injection 80 mg  . ropivacaine (PF) 2 mg/mL (0.2%) (  NAROPIN) injection 4 mL  . iopamidol (ISOVUE-M) 41 % intrathecal injection 10 mL  . ropivacaine (PF) 2 mg/mL (0.2%) (NAROPIN) injection 9 mL  . methylPREDNISolone acetate (DEPO-MEDROL) injection 80 mg   Medications administered: We administered lidocaine, lactated ringers, midazolam, fentaNYL, methylPREDNISolone acetate, ropivacaine (PF) 2 mg/mL (0.2%), ropivacaine (PF) 2 mg/mL (0.2%), and methylPREDNISolone acetate.  See the medical record for exact dosing, route, and time of administration.  Follow-up plan:   Return in about 2 weeks (around 12/12/2020) for procedure day (afternoon F2F) (PPE).       Interventional Therapies  Risk  Complexity Considerations:   WNL   Planned  Pending:   Pending further evaluation   Under consideration:   Diagnostic caudal diagnostic epidurogram Diagnostic CESI Diagnostic bilateral cervical facet block Possible bilateral cervical facet RFA  Diagnostic left L4-5 LESI  Diagnostic left S1 SNRB   Completed:   Therapeutic right Racz procedure x1 (08/17/2020) (100% relief of the right lower extremity pain) Diagnostic caudal ESI x1 (07/11/2020) Palliative left L5-S1 LESI x2 (07/27/2015)  Diagnostic left lumbar facet MBB x6 (05/14/2018)  Palliative right lumbar facet MBB x9 (06/13/2020)  Therapeutic right shoulder joint injection x1 (02/29/2016)  Therapeutic left IA hip injection x3 (06/13/2020)  Diagnostic right SI joint block x3 (06/17/2017)  Diagnostic/therapeutic left SI joint block x4 (06/17/2017)  Palliative bilateral trochanteric bursa injection x1 (12/11/2016)  Therapeutic right lumbar facet MB RFA x1 (08/07/2017)  Therapeutic left lumbar facet MB RFA x1 (09/16/2017)  Therapeutic right SI joint RFA x1 (08/07/2017)  Therapeutic left SI joint RFA x1 (09/16/2017)    Therapeutic  Palliative (PRN)  options:   Palliative Racz procedure  Palliativebilateral lumbar facet block  Palliativebilateral SI joint block Palliativeright trochanteric bursa injection     Recent Visits Date Type Provider Dept  09/13/20 Office Visit Milinda Pointer, MD [facility]  Showing recent visits within past 90 days and meeting all other requirements Today's Visits Date Type Provider Dept  11/28/20 Procedure visit Milinda Pointer, MD [facility]  Showing today's visits and meeting all other requirements Future Appointments Date Type Provider Dept  12/13/20 Appointment Milinda Pointer, MD [facility]  01/08/21 Appointment Milinda Pointer, MD [facility]  Showing future appointments within next 90 days and meeting all other requirements  Disposition: Discharge home  Discharge (Date  Time): 11/28/2020; 1206 hrs.   Primary Care Physician: Perrin Maltese, MD Location: [facility] Note by: Gaspar Cola, MD Date: 11/28/2020; Time: 4:23 PM  Disclaimer:  Medicine is not an Chief Strategy Officer. The only guarantee in medicine is that nothing is guaranteed. It is important to note that the decision to proceed with this intervention was based on the information collected from the patient. The Data and conclusions were drawn from the patient's questionnaire, the interview, and the physical examination. Because the information was provided in large part by the patient, it cannot be guaranteed that it has not been purposely or unconsciously manipulated. Every effort has been made to obtain as much relevant data as possible for this evaluation. It is important to note that the conclusions that lead to this procedure are derived in large part from the available data. Always take into account that the treatment will also be dependent on availability of resources and existing treatment guidelines, considered by other Pain Management  Practitioners as being common knowledge and practice, at the time of the intervention. For Medico-Legal purposes, it is also important to point out that variation in procedural  techniques and pharmacological choices are the acceptable norm. The indications, contraindications, technique, and results of the above procedure should only be interpreted and judged by a Board-Certified Interventional Pain Specialist with extensive familiarity and expertise in the same exact procedure and technique.

## 2020-11-28 ENCOUNTER — Other Ambulatory Visit: Payer: Self-pay

## 2020-11-28 ENCOUNTER — Encounter: Payer: Self-pay | Admitting: Pain Medicine

## 2020-11-28 ENCOUNTER — Ambulatory Visit (HOSPITAL_BASED_OUTPATIENT_CLINIC_OR_DEPARTMENT_OTHER): Payer: Medicare Other | Admitting: Pain Medicine

## 2020-11-28 ENCOUNTER — Ambulatory Visit
Admission: RE | Admit: 2020-11-28 | Discharge: 2020-11-28 | Disposition: A | Discharge status: Home / Self Care | Payer: Medicare Other | Source: Ambulatory Visit | Attending: Pain Medicine | Admitting: Pain Medicine

## 2020-11-28 VITALS — BP 101/72 | HR 80 | Temp 97.2°F | Resp 15 | Ht 60.0 in | Wt 149.0 lb

## 2020-11-28 DIAGNOSIS — M25511 Pain in right shoulder: Secondary | ICD-10-CM

## 2020-11-28 DIAGNOSIS — M25551 Pain in right hip: Secondary | ICD-10-CM | POA: Insufficient documentation

## 2020-11-28 DIAGNOSIS — M5388 Other specified dorsopathies, sacral and sacrococcygeal region: Secondary | ICD-10-CM

## 2020-11-28 DIAGNOSIS — M7061 Trochanteric bursitis, right hip: Secondary | ICD-10-CM

## 2020-11-28 DIAGNOSIS — M461 Sacroiliitis, not elsewhere classified: Secondary | ICD-10-CM | POA: Insufficient documentation

## 2020-11-28 DIAGNOSIS — M545 Low back pain, unspecified: Secondary | ICD-10-CM | POA: Insufficient documentation

## 2020-11-28 DIAGNOSIS — M779 Enthesopathy, unspecified: Secondary | ICD-10-CM | POA: Diagnosis present

## 2020-11-28 DIAGNOSIS — M47818 Spondylosis without myelopathy or radiculopathy, sacral and sacrococcygeal region: Secondary | ICD-10-CM | POA: Diagnosis present

## 2020-11-28 DIAGNOSIS — M533 Sacrococcygeal disorders, not elsewhere classified: Secondary | ICD-10-CM | POA: Diagnosis present

## 2020-11-28 DIAGNOSIS — M1611 Unilateral primary osteoarthritis, right hip: Secondary | ICD-10-CM

## 2020-11-28 DIAGNOSIS — M1612 Unilateral primary osteoarthritis, left hip: Secondary | ICD-10-CM | POA: Insufficient documentation

## 2020-11-28 DIAGNOSIS — M25552 Pain in left hip: Secondary | ICD-10-CM | POA: Diagnosis present

## 2020-11-28 DIAGNOSIS — M9904 Segmental and somatic dysfunction of sacral region: Secondary | ICD-10-CM

## 2020-11-28 DIAGNOSIS — G8929 Other chronic pain: Secondary | ICD-10-CM | POA: Diagnosis present

## 2020-11-28 MED ORDER — IOPAMIDOL (ISOVUE-M 200) INJECTION 41%: 10.0000 mL | Freq: Once | INTRAMUSCULAR | Status: DC

## 2020-11-28 MED ORDER — METHYLPREDNISOLONE ACETATE 80 MG/ML IJ SUSP
80.0000 mg | Freq: Once | INTRAMUSCULAR | Status: AC
  Administered 2020-11-28: 80 mg via INTRA_ARTICULAR
  Filled 2020-11-28: qty 1

## 2020-11-28 MED ORDER — ROPIVACAINE HCL 2 MG/ML IJ SOLN
INTRAMUSCULAR | Status: AC
  Filled 2020-11-28: qty 20

## 2020-11-28 MED ORDER — LIDOCAINE HCL 2 % IJ SOLN
20.0000 mL | Freq: Once | INTRAMUSCULAR | Status: AC
  Administered 2020-11-28: 400 mg
  Filled 2020-11-28: qty 40

## 2020-11-28 MED ORDER — ROPIVACAINE HCL 2 MG/ML IJ SOLN
4.0000 mL | Freq: Once | INTRAMUSCULAR | Status: AC
  Administered 2020-11-28: 4 mL via INTRA_ARTICULAR

## 2020-11-28 MED ORDER — ROPIVACAINE HCL 2 MG/ML IJ SOLN
9.0000 mL | Freq: Once | INTRAMUSCULAR | Status: AC
  Administered 2020-11-28: 9 mL via INTRA_ARTICULAR

## 2020-11-28 MED ORDER — FENTANYL CITRATE (PF) 100 MCG/2ML IJ SOLN
25.0000 ug | INTRAMUSCULAR | Status: DC | PRN
Start: 2020-11-28 — End: 2020-11-28
  Administered 2020-11-28: 50 ug via INTRAVENOUS
  Filled 2020-11-28: qty 2

## 2020-11-28 MED ORDER — IOHEXOL 180 MG/ML  SOLN
INTRAMUSCULAR | Status: AC
  Filled 2020-11-28: qty 20

## 2020-11-28 MED ORDER — MIDAZOLAM HCL 5 MG/5ML IJ SOLN
1.0000 mg | INTRAMUSCULAR | Status: DC | PRN
  Administered 2020-11-28: 1 mg via INTRAVENOUS
  Filled 2020-11-28: qty 5

## 2020-11-28 MED ORDER — LACTATED RINGERS IV SOLN
1000.0000 mL | Freq: Once | INTRAVENOUS | Status: AC
  Administered 2020-11-28: 1000 mL via INTRAVENOUS

## 2020-11-28 NOTE — Patient Instructions (Addendum)

## 2020-11-29 ENCOUNTER — Telehealth: Payer: Self-pay | Admitting: *Deleted

## 2020-11-29 NOTE — Telephone Encounter (Signed)
No problems post procedure. 

## 2020-12-07 ENCOUNTER — Ambulatory Visit (INDEPENDENT_AMBULATORY_CARE_PROVIDER_SITE_OTHER): Payer: Medicare Other | Admitting: Vascular Surgery

## 2020-12-07 NOTE — Progress Notes (Deleted)
MRN : [id number]  Jacqueline Thornton is a 70 y.o. (05-21-1951) female who presents with chief complaint of No chief complaint on file. Marland Kitchen  History of Present Illness:     No outpatient medications have been marked as taking for the 12/07/20 encounter (Appointment) with Delana Meyer, Dolores Lory, MD.    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   Atypical face pain 04/24/2015   Back pain    CAD (coronary artery disease)    pt denies   Cancer (HCC)    squamous cell- R temple    Chronic back pain    Constipation    Depression    Fibromyalgia    GERD (gastroesophageal reflux disease)    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) 04/11/2015   H/O: hysterectomy 1979   Heart murmur    Hyperlipidemia    Hypertension    Low back pain 04/06/2015   Lumbar radicular pain 04/18/2015   Migraine    Narrowing of intervertebral disc space 04/11/2015   Sacroiliac joint pain 04/11/2015   Spine disorder     Past Surgical History:  Procedure Laterality Date   ABDOMINAL EXPOSURE N/A 12/11/2018   Procedure: ABDOMINAL EXPOSURE;  Surgeon: Serafina Mitchell, MD;  Location: [place];  Service: Vascular;  Laterality: N/A;   ABDOMINAL HYSTERECTOMY     ANTERIOR CERVICAL DECOMP/DISCECTOMY FUSION N/A 10/06/2020   Procedure: Cervical three -four Anterior cervical decompression/discectomy/fusion  with possible anchor c and exploration of fusion Cervical four to Six;  Surgeon: Erline Levine, MD;  Location: [place];  Service: Neurosurgery;  Laterality: N/A;   ANTERIOR LUMBAR FUSION N/A 12/11/2018   Procedure: Lumbar five Sacral one Anterior lumbar interbody fusion;  Surgeon: Erline Levine, MD;  Location: [place];  Service: Neurosurgery;  Laterality: N/A;   APPENDECTOMY     Age19   caract surger     02/24/17   CHOLECYSTECTOMY     COLONOSCOPY WITH  PROPOFOL N/A 07/19/2015   Procedure: COLONOSCOPY WITH PROPOFOL;  Surgeon: Manya Silvas, MD;  Location: [facility];  Service: Endoscopy;  Laterality: N/A;   CYSTOSCOPY  03/03/2018   Procedure: CYSTOSCOPY;  Surgeon: Will Bonnet, MD;  Location: [facility];  Service: Gynecology;;   LAPAROSCOPIC SALPINGO OOPHERECTOMY Bilateral 03/03/2018   Procedure: LAPAROSCOPIC SALPINGO OOPHORECTOMY;  Surgeon: Will Bonnet, MD;  Location: [facility];  Service: Gynecology;  Laterality: Bilateral;   NECK SURGERY     Age 35 and 49.   SHOULDER SURGERY     TONSILLECTOMY     Age 67   TUMOR REMOVAL     Ovaries    Social History Social History   Tobacco Use   Smoking status: Former    Pack years: 0.00    Types: Cigarettes    Quit date: 07/02/2003    Years since quitting: 17.4   Smokeless tobacco: Never  Vaping Use   Vaping Use: Never used  Substance Use Topics   Alcohol use: No    Alcohol/week: 0.0 standard drinks   Drug use: No    Family History Family History  Problem Relation Age of Onset   Diabetes Father    Hypertension Sister    Diabetes Mother    Stroke Mother    Colon cancer Mother    Colon cancer Maternal Grandmother  Ovarian cancer Neg Hx    Breast cancer Neg Hx     No Known Allergies   REVIEW OF SYSTEMS (Negative unless checked)  Constitutional: [] Weight loss  [] Fever  [] Chills Cardiac: [] Chest pain   [] Chest pressure   [] Palpitations   [] Shortness of breath when laying flat   [] Shortness of breath with exertion. Vascular:  [] Pain in legs with walking   [] Pain in legs at rest  [] History of DVT   [] Phlebitis   [] Swelling in legs   [] Varicose veins   [] Non-healing ulcers Pulmonary:   [] Uses home oxygen   [] Productive cough   [] Hemoptysis   [] Wheeze  [] COPD   [] Asthma Neurologic:  [] Dizziness   [] Seizures   [] History of stroke   [] History of TIA  [] Aphasia   [] Vissual changes   [] Weakness or numbness in arm   [] Weakness or numbness in leg Musculoskeletal:   [] Joint  swelling   [] Joint pain   [] Low back pain Hematologic:  [] Easy bruising  [] Easy bleeding   [] Hypercoagulable state   [] Anemic Gastrointestinal:  [] Diarrhea   [] Vomiting  [] Gastroesophageal reflux/heartburn   [] Difficulty swallowing. Genitourinary:  [] Chronic kidney disease   [] Difficult urination  [] Frequent urination   [] Blood in urine Skin:  [] Rashes   [] Ulcers  Psychological:  [] History of anxiety   []  History of major depression.  Physical Examination  There were no vitals filed for this visit. There is no height or weight on file to calculate BMI. Gen: WD/WN, NAD Head: [place]/AT, No temporalis wasting.  Ear/Nose/Throat: Hearing grossly intact, nares w/o erythema or drainage Eyes: PER, EOMI, sclera nonicteric.  Neck: Supple, no large masses.   Pulmonary:  Good air movement, no audible wheezing bilaterally, no use of accessory muscles.  Cardiac: RRR, no JVD Vascular: *** Vessel Right Left  Radial Palpable Palpable  Ulnar Palpable Palpable  Brachial Palpable Palpable  Carotid Palpable Palpable  Femoral Palpable Palpable  Popliteal Palpable Palpable  PT Palpable Palpable  DP Palpable Palpable  Gastrointestinal: Non-distended. No guarding/no peritoneal signs.  Musculoskeletal: M/S 5/5 throughout.  No deformity or atrophy.  Neurologic: CN 2-12 intact. Symmetrical.  Speech is fluent. Motor exam as listed above. Psychiatric: Judgment intact, Mood & affect appropriate for pt's clinical situation. Dermatologic: No rashes or ulcers noted.  No changes consistent with cellulitis. Lymph : No lichenification or skin changes of chronic lymphedema.  CBC Lab Results  Component Value Date   WBC 4.2 10/06/2020   HGB 11.2 (L) 10/06/2020   HCT 35.3 (L) 10/06/2020   MCV 103.8 (H) 10/06/2020   PLT 194 10/06/2020    BMET    Component Value Date/Time   NA 137 10/06/2020 0937   NA 140 12/16/2019 1607   NA 145 01/26/2012 1452   K 3.6 10/06/2020 0937   K 3.5 01/26/2012 1452   CL 105  10/06/2020 0937   CL 111 (H) 01/26/2012 1452   CO2 27 10/06/2020 0937   CO2 26 01/26/2012 1452   GLUCOSE 89 10/06/2020 0937   GLUCOSE 92 01/26/2012 1452   BUN 8 10/06/2020 0937   BUN 11 12/16/2019 1607   BUN 5 (L) 01/26/2012 1452   CREATININE 0.92 10/06/2020 0937   CREATININE 0.86 01/26/2012 1452   CALCIUM 8.6 (L) 10/06/2020 0937   CALCIUM 8.6 01/26/2012 1452   GFRNONAA >60 10/06/2020 0937   GFRNONAA >60 01/26/2012 1452   GFRAA 47 (L) 12/16/2019 1607   GFRAA >60 01/26/2012 1452   CrCl cannot be calculated (Patient's most recent lab result is older than  the maximum 21 days allowed.).  COAG Lab Results  Component Value Date   INR 1.0 03/02/2014    Radiology DG PAIN CLINIC C-ARM 1-60 MIN NO REPORT  Result Date: 11/28/2020 Fluoro was used, but no Radiologist interpretation will be provided. Please refer to "NOTES" tab for provider progress note.    Assessment/Plan There are no diagnoses linked to this encounter.   Hortencia Pilar, MD  12/07/2020 1:18 PM

## 2020-12-13 ENCOUNTER — Ambulatory Visit: Payer: Medicare Other | Attending: Pain Medicine | Admitting: Pain Medicine

## 2020-12-13 ENCOUNTER — Other Ambulatory Visit: Payer: Self-pay

## 2020-12-13 ENCOUNTER — Encounter: Payer: Self-pay | Admitting: Pain Medicine

## 2020-12-13 VITALS — BP 133/78 | HR 98 | Temp 97.0°F | Ht 60.0 in | Wt 149.0 lb

## 2020-12-13 DIAGNOSIS — M47816 Spondylosis without myelopathy or radiculopathy, lumbar region: Secondary | ICD-10-CM | POA: Diagnosis present

## 2020-12-13 DIAGNOSIS — Z79899 Other long term (current) drug therapy: Secondary | ICD-10-CM | POA: Insufficient documentation

## 2020-12-13 DIAGNOSIS — M545 Low back pain, unspecified: Secondary | ICD-10-CM

## 2020-12-13 DIAGNOSIS — I2510 Atherosclerotic heart disease of native coronary artery without angina pectoris: Secondary | ICD-10-CM | POA: Diagnosis not present

## 2020-12-13 DIAGNOSIS — G8929 Other chronic pain: Secondary | ICD-10-CM | POA: Diagnosis present

## 2020-12-13 DIAGNOSIS — Z79891 Long term (current) use of opiate analgesic: Secondary | ICD-10-CM | POA: Insufficient documentation

## 2020-12-13 DIAGNOSIS — M47817 Spondylosis without myelopathy or radiculopathy, lumbosacral region: Secondary | ICD-10-CM | POA: Insufficient documentation

## 2020-12-13 DIAGNOSIS — G894 Chronic pain syndrome: Secondary | ICD-10-CM | POA: Insufficient documentation

## 2020-12-13 MED ORDER — OXYCODONE-ACETAMINOPHEN 7.5-325 MG PO TABS: 1.0000 | ORAL_TABLET | Freq: Every day | ORAL | 0 refills | Status: DC

## 2020-12-13 NOTE — Patient Instructions (Signed)
____________________________________________________________________________________________  Preparing for Procedure with Sedation  Procedure appointments are limited to planned procedures: No Prescription Refills. No disability issues will be discussed. No medication changes will be discussed.  Instructions: Oral Intake: Do not eat or drink anything for at least 8 hours prior to your procedure. (Exception: Blood Pressure Medication. See below.) Transportation: Unless otherwise stated by your physician, you may drive yourself after the procedure. Blood Pressure Medicine: Do not forget to take your blood pressure medicine with a sip of water the morning of the procedure. If your Diastolic (lower reading)is above 100 mmHg, elective cases will be cancelled/rescheduled. Blood thinners: These will need to be stopped for procedures. Notify our staff if you are taking any blood thinners. Depending on which one you take, there will be specific instructions on how and when to stop it. Diabetics on insulin: Notify the staff so that you can be scheduled 1st case in the morning. If your diabetes requires high dose insulin, take only  of your normal insulin dose the morning of the procedure and notify the staff that you have done so. Preventing infections: Shower with an antibacterial soap the morning of your procedure. Build-up your immune system: Take 1000 mg of Vitamin C with every meal (3 times a day) the day prior to your procedure. Antibiotics: Inform the staff if you have a condition or reason that requires you to take antibiotics before dental procedures. Pregnancy: If you are pregnant, call and cancel the procedure. Sickness: If you have a cold, fever, or any active infections, call and cancel the procedure. Arrival: You must be in the facility at least 30 minutes prior to your scheduled procedure. Children: Do not bring children with you. Dress appropriately: Bring dark clothing that you would  not mind if they get stained. Valuables: Do not bring any jewelry or valuables.  Reasons to call and reschedule or cancel your procedure: (Following these recommendations will minimize the risk of a serious complication.) Surgeries: Avoid having procedures within 2 weeks of any surgery. (Avoid for 2 weeks before or after any surgery). Flu Shots: Avoid having procedures within 2 weeks of a flu shots or . (Avoid for 2 weeks before or after immunizations). Barium: Avoid having a procedure within 7-10 days after having had a radiological study involving the use of radiological contrast. (Myelograms, Barium swallow or enema study). Heart attacks: Avoid any elective procedures or surgeries for the initial 6 months after a "Myocardial Infarction" (Heart Attack). Blood thinners: It is imperative that you stop these medications before procedures. Let us know if you if you take any blood thinner.  Infection: Avoid procedures during or within two weeks of an infection (including chest colds or gastrointestinal problems). Symptoms associated with infections include: Localized redness, fever, chills, night sweats or profuse sweating, burning sensation when voiding, cough, congestion, stuffiness, runny nose, sore throat, diarrhea, nausea, vomiting, cold or Flu symptoms, recent or current infections. It is specially important if the infection is over the area that we intend to treat. Heart and lung problems: Symptoms that may suggest an active cardiopulmonary problem include: cough, chest pain, breathing difficulties or shortness of breath, dizziness, ankle swelling, uncontrolled high or unusually low blood pressure, and/or palpitations. If you are experiencing any of these symptoms, cancel your procedure and contact your primary care physician for an evaluation.  Remember:  Regular Business hours are:  Monday to Thursday 8:00 AM to 4:00 PM  Provider's Schedule: Ura Hausen, MD:  Procedure days: Tuesday and  Thursday 7:30 AM   to 4:00 PM  Bilal Lateef, MD:  Procedure days: Monday and Wednesday 7:30 AM to 4:00 PM ____________________________________________________________________________________________  ____________________________________________________________________________________________  General Risks and Possible Complications  Patient Responsibilities: It is important that you read this as it is part of your informed consent. It is our duty to inform you of the risks and possible complications associated with treatments offered to you. It is your responsibility as a patient to read this and to ask questions about anything that is not clear or that you believe was not covered in this document.  Patient's Rights: You have the right to refuse treatment. You also have the right to change your mind, even after initially having agreed to have the treatment done. However, under this last option, if you wait until the last second to change your mind, you may be charged for the materials used up to that point.  Introduction: Medicine is not an exact science. Everything in Medicine, including the lack of treatment(s), carries the potential for danger, harm, or loss (which is by definition: Risk). In Medicine, a complication is a secondary problem, condition, or disease that can aggravate an already existing one. All treatments carry the risk of possible complications. The fact that a side effects or complications occurs, does not imply that the treatment was conducted incorrectly. It must be clearly understood that these can happen even when everything is done following the highest safety standards.  No treatment: You can choose not to proceed with the proposed treatment alternative. The "PRO(s)" would include: avoiding the risk of complications associated with the therapy. The "CON(s)" would include: not getting any of the treatment benefits. These benefits fall under one of three categories: diagnostic;  therapeutic; and/or palliative. Diagnostic benefits include: getting information which can ultimately lead to improvement of the disease or symptom(s). Therapeutic benefits are those associated with the successful treatment of the disease. Finally, palliative benefits are those related to the decrease of the primary symptoms, without necessarily curing the condition (example: decreasing the pain from a flare-up of a chronic condition, such as incurable terminal cancer).  General Risks and Complications: These are associated to most interventional treatments. They can occur alone, or in combination. They fall under one of the following six (6) categories: no benefit or worsening of symptoms; bleeding; infection; nerve damage; allergic reactions; and/or death. No benefits or worsening of symptoms: In Medicine there are no guarantees, only probabilities. No healthcare provider can ever guarantee that a medical treatment will work, they can only state the probability that it may. Furthermore, there is always the possibility that the condition may worsen, either directly, or indirectly, as a consequence of the treatment. Bleeding: This is more common if the patient is taking a blood thinner, either prescription or over the counter (example: Goody Powders, Fish oil, Aspirin, Garlic, etc.), or if suffering a condition associated with impaired coagulation (example: Hemophilia, cirrhosis of the liver, low platelet counts, etc.). However, even if you do not have one on these, it can still happen. If you have any of these conditions, or take one of these drugs, make sure to notify your treating physician. Infection: This is more common in patients with a compromised immune system, either due to disease (example: diabetes, cancer, human immunodeficiency virus [HIV], etc.), or due to medications or treatments (example: therapies used to treat cancer and rheumatological diseases). However, even if you do not have one on  these, it can still happen. If you have any of these conditions, or take one of   these drugs, make sure to notify your treating physician. Nerve Damage: This is more common when the treatment is an invasive one, but it can also happen with the use of medications, such as those used in the treatment of cancer. The damage can occur to small secondary nerves, or to large primary ones, such as those in the spinal cord and brain. This damage may be temporary or permanent and it may lead to impairments that can range from temporary numbness to permanent paralysis and/or brain death. Allergic Reactions: Any time a substance or material comes in contact with our body, there is the possibility of an allergic reaction. These can range from a mild skin rash (contact dermatitis) to a severe systemic reaction (anaphylactic reaction), which can result in death. Death: In general, any medical intervention can result in death, most of the time due to an unforeseen complication. ____________________________________________________________________________________________  ____________________________________________________________________________________________  Medication Recommendations and Reminders  Applies to: All patients receiving prescriptions (written and/or electronic).  Medication Rules & Regulations: These rules and regulations exist for your safety and that of others. They are not flexible and neither are we. Dismissing or ignoring them will be considered "non-compliance" with medication therapy, resulting in complete and irreversible termination of such therapy. (See document titled "Medication Rules" for more details.) In all conscience, because of safety reasons, we cannot continue providing a therapy where the patient does not follow instructions.  Pharmacy of record:  Definition: This is the pharmacy where your electronic prescriptions will be sent.  We do not endorse any particular pharmacy,  however, we have experienced problems with [facility] not securing enough medication supply for the community. We do not restrict you in your choice of pharmacy. However, once we write for your prescriptions, we will NOT be re-sending more prescriptions to fix restricted supply problems created by your pharmacy, or your insurance.  The pharmacy listed in the electronic medical record should be the one where you want electronic prescriptions to be sent. If you choose to change pharmacy, simply notify our nursing staff.  Recommendations: Keep all of your pain medications in a safe place, under lock and key, even if you live alone. We will NOT replace lost, stolen, or damaged medication. After you fill your prescription, take 1 week's worth of pills and put them away in a safe place. You should keep a separate, properly labeled bottle for this purpose. The remainder should be kept in the original bottle. Use this as your primary supply, until it runs out. Once it's gone, then you know that you have 1 week's worth of medicine, and it is time to come in for a prescription refill. If you do this correctly, it is unlikely that you will ever run out of medicine. To make sure that the above recommendation works, it is very important that you make sure your medication refill appointments are scheduled at least 1 week before you run out of medicine. To do this in an effective manner, make sure that you do not leave the office without scheduling your next medication management appointment. Always ask the nursing staff to show you in your prescription , when your medication will be running out. Then arrange for the receptionist to get you a return appointment, at least 7 days before you run out of medicine. Do not wait until you have 1 or 2 pills left, to come in. This is very poor planning and does not take into consideration that we may need to cancel appointments due to  bad weather, sickness, or emergencies affecting  our staff. DO NOT ACCEPT A "Partial Fill": If for any reason your pharmacy does not have enough pills/tablets to completely fill or refill your prescription, do not allow for a "partial fill". The law allows the pharmacy to complete that prescription within 72 hours, without requiring a new prescription. If they do not fill the rest of your prescription within those 72 hours, you will need a separate prescription to fill the remaining amount, which we will NOT provide. If the reason for the partial fill is your insurance, you will need to talk to the pharmacist about payment alternatives for the remaining tablets, but again, DO NOT ACCEPT A PARTIAL FILL, unless you can trust your pharmacist to obtain the remainder of the pills within 72 hours.  Prescription refills and/or changes in medication(s):  Prescription refills, and/or changes in dose or medication, will be conducted only during scheduled medication management appointments. (Applies to both, written and electronic prescriptions.) No refills on procedure days. No medication will be changed or started on procedure days. No changes, adjustments, and/or refills will be conducted on a procedure day. Doing so will interfere with the diagnostic portion of the procedure. No phone refills. No medications will be "called into the pharmacy". No Fax refills. No weekend refills. No Holliday refills. No after hours refills.  Remember:  Business hours are:  Monday to Thursday 8:00 AM to 4:00 PM Provider's Schedule: Milinda Pointer, MD - Appointments are:  Medication management: Monday and Wednesday 8:00 AM to 4:00 PM Procedure day: Tuesday and Thursday 7:30 AM to 4:00 PM Gillis Santa, MD - Appointments are:  Medication management: Tuesday and Thursday 8:00 AM to 4:00 PM Procedure day: Monday and Wednesday 7:30 AM to 4:00 PM (Last update: 01/19/2020) ____________________________________________________________________________________________   ____________________________________________________________________________________________  CBD (cannabidiol) WARNING  Applicable to: All individuals currently taking or considering taking CBD (cannabidiol) and, more important, all patients taking opioid analgesic controlled substances (pain medication). (Example: oxycodone; oxymorphone; hydrocodone; hydromorphone; morphine; methadone; tramadol; tapentadol; fentanyl; buprenorphine; butorphanol; dextromethorphan; meperidine; codeine; etc.)  Legal status: CBD remains a Schedule I drug prohibited for any use. CBD is illegal with one exception. In the [place], CBD has a limited Transport planner (FDA) approval for the treatment of two specific types of epilepsy disorders. Only one CBD product has been approved by the FDA for this purpose: "Epidiolex". FDA is aware that some companies are marketing products containing cannabis and cannabis-derived compounds in ways that violate the [employer], Drug and Cosmetic Act [facility] Act) and that may put the health and safety of consumers at risk. The FDA, a Federal agency, has not enforced the CBD status since 2018.   Legality: Some manufacturers ship CBD products nationally, which is illegal. Often such products are sold online and are therefore available throughout the country. CBD is openly sold in head shops and health food stores in some states where such sales have not been explicitly legalized. Selling unapproved products with unsubstantiated therapeutic claims is not only a violation of the law, but also can put patients at risk, as these products have not been proven to be safe or effective. Federal illegality makes it difficult to conduct research on CBD.  Reference: "FDA Regulation of Cannabis and Cannabis-Derived Products, Including Cannabidiol (CBD)" -  [employer]  Warning: CBD is not FDA approved and has not undergo the same manufacturing controls as prescription drugs.  This means that the purity and safety of available CBD may be questionable. Most  of the time, despite manufacturer's claims, it is contaminated with THC (delta-9-tetrahydrocannabinol - the chemical in marijuana responsible for the "HIGH").  When this is the case, the [facility] contaminant will trigger a positive urine drug screen (UDS) test for Marijuana (carboxy-THC). Because a positive UDS for any illicit substance is a violation of our medication agreement, your opioid analgesics (pain medicine) may be permanently discontinued.  MORE ABOUT CBD  General Information: CBD  is a derivative of the Marijuana (cannabis sativa) plant discovered in 88. It is one of the 113 identified substances found in Marijuana. It accounts for up to 40% of the plant's extract. As of 2018, preliminary clinical studies on CBD included research for the treatment of anxiety, movement disorders, and pain. CBD is available and consumed in multiple forms, including inhalation of smoke or vapor, as an aerosol spray, and by mouth. It may be supplied as an oil containing CBD, capsules, dried cannabis, or as a liquid solution. CBD is thought not to be as psychoactive as THC (delta-9-tetrahydrocannabinol - the chemical in marijuana responsible for the "HIGH"). Studies suggest that CBD may interact with different biological target receptors in the body, including cannabinoid and other neurotransmitter receptors. As of 2018 the mechanism of action for its biological effects has not been determined.  Side-effects  Adverse reactions: Dry mouth, diarrhea, decreased appetite, fatigue, drowsiness, malaise, weakness, sleep disturbances, and others.  Drug interactions: CBC may interact with other medications  such as blood-thinners. (Last update: 02/05/2020) ____________________________________________________________________________________________  ____________________________________________________________________________________________  Drug Holidays (Slow)  What is a "Drug Holiday"? Drug Holiday: is the name given to the period of time during which a patient stops taking a medication(s) for the purpose of eliminating tolerance to the drug.  Benefits Improved effectiveness of opioids. Decreased opioid dose needed to achieve benefits. Improved pain with lesser dose.  What is tolerance? Tolerance: is the progressive decreased in effectiveness of a drug due to its repetitive use. With repetitive use, the body gets use to the medication and as a consequence, it loses its effectiveness. This is a common problem seen with opioid pain medications. As a result, a larger dose of the drug is needed to achieve the same effect that used to be obtained with a smaller dose.  How long should a "Drug Holiday" last? You should stay off of the pain medicine for at least 14 consecutive days. (2 weeks)  Should I stop the medicine "cold [place]"? No. You should always coordinate with your Pain Specialist so that he/she can provide you with the correct medication dose to make the transition as smoothly as possible.  How do I stop the medicine? Slowly. You will be instructed to decrease the daily amount of pills that you take by one (1) pill every seven (7) days. This is called a "slow downward taper" of your dose. For example: if you normally take four (4) pills per day, you will be asked to drop this dose to three (3) pills per day for seven (7) days, then to two (2) pills per day for seven (7) days, then to one (1) per day for seven (7) days, and at the end of those last seven (7) days, this is when the "Drug Holiday" would start.   Will I have withdrawals? By doing a "slow downward taper" like this one, it  is unlikely that you will experience any significant withdrawal symptoms. Typically, what triggers withdrawals is the sudden stop of a high dose opioid therapy. Withdrawals can usually be avoided by  slowly decreasing the dose over a prolonged period of time. If you do not follow these instructions and decide to stop your medication abruptly, withdrawals may be possible.  What are withdrawals? Withdrawals: refers to the wide range of symptoms that occur after stopping or dramatically reducing opiate drugs after heavy and prolonged use. Withdrawal symptoms do not occur to patients that use low dose opioids, or those who take the medication sporadically. Contrary to benzodiazepine (example: Valium, Xanax, etc.) or alcohol withdrawals ("Delirium Tremens"), opioid withdrawals are not lethal. Withdrawals are the physical manifestation of the body getting rid of the excess receptors.  Expected Symptoms Early symptoms of withdrawal may include: Agitation Anxiety Muscle aches Increased tearing Insomnia Runny nose Sweating Yawning  Late symptoms of withdrawal may include: Abdominal cramping Diarrhea Dilated pupils Goose bumps Nausea Vomiting  Will I experience withdrawals? Due to the slow nature of the taper, it is very unlikely that you will experience any.  What is a slow taper? Taper: refers to the gradual decrease in dose.  (Last update: 01/19/2020) ____________________________________________________________________________________________

## 2020-12-13 NOTE — Progress Notes (Signed)
Safety precautions to be maintained throughout the outpatient stay will include: orient to surroundings, keep bed in low position, maintain call bell within reach at all times, provide assistance with transfer out of bed and ambulation.  Nursing Pain Medication Assessment:  Safety precautions to be maintained throughout the outpatient stay will include: orient to surroundings, keep bed in low position, maintain call bell within reach at all times, provide assistance with transfer out of bed and ambulation.  Medication Inspection Compliance: Pill count conducted under aseptic conditions, in front of the patient. Neither the pills nor the bottle was removed from the patient's sight at any time. Once count was completed pills were immediately returned to the patient in their original bottle.  Medication: Oxycodone/APAP Pill/Patch Count:  13 of 150 pills remain Pill/Patch Appearance: Markings consistent with prescribed medication Bottle Appearance: Standard pharmacy container. Clearly labeled. Filled Date: 5 / 20 / 2022 Last Medication intake:  Today

## 2020-12-13 NOTE — Progress Notes (Signed)
PROVIDER NOTE: Information contained herein reflects review and annotations entered in association with encounter. Interpretation of such information and data should be left to medically-trained personnel. Information provided to patient can be located elsewhere in the medical record under "Patient Instructions". Document created using STT-dictation technology, any transcriptional errors that may result from process are unintentional.    Patient: Jacqueline Thornton  Service Category: E/M  Provider: Gaspar Cola, MD  DOB: 1951-03-31  DOS: 12/13/2020  Specialty: Interventional Pain Management  MRN: [id number]  Setting: Ambulatory outpatient  PCP: Perrin Maltese, MD  Type: Established Patient    Referring Provider: Perrin Maltese, MD  Location: Office  Delivery: Face-to-face     HPI  [name], a 70 y.o. 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 Jacqueline Thornton's primary complain today is Back Pain Last encounter: My last encounter with her was on 11/28/2020. Pertinent problems: Jacqueline Thornton has Chronic pain syndrome; Headache, migraine; Chronic low back pain (2ry area of Pain) (Bilateral) (R>L) w/o sciatica; Spondylosis of lumbar spine; Lumbar annular disc tear (L4-5); Discogenic low back pain (L3-4 and L4-5); Lumbar facet hypertrophy; Lumbar facet syndrome (Bilateral) (R>L); Chronic neck pain (Left); Cervical spondylosis; Hx of cervical spine surgery; Cervical spinal fusion (C6-7 interbody fusion); Chronic sacroiliac joint pain (Bilateral) (L>R); Chronic lumbar radicular pain (S1 Dermatome) (1ry area of Pain) (Bilateral); Trochanteric bursitis of hip (Right); Chronic hip pain (Bilateral) (L>R); Neurogenic pain; Myofascial pain; Osteoarthritis of hip (Left); Arthrodesis status; DDD (degenerative disc disease), lumbar; Myalgia; Trochanteric bursitis of hip (Bilateral) (L>R); Spondylosis without myelopathy or radiculopathy, lumbosacral region; Spondylosis without  myelopathy or radiculopathy, sacral and sacrococcygeal region; Other specified dorsopathies, sacral and sacrococcygeal region; Age-related osteoporosis without current pathological fracture; Bone island of right femur; Inflammatory spondylopathy of lumbar region [facility]); Trigger point with back pain (Right); Fibromyalgia; DDD (degenerative disc disease), cervical; Spondylolisthesis of lumbar region; History of lumbar spinal fusion (Dr. Erline Levine) (12/11/2018); Failed back surgical syndrome; Unilateral occipital headache (Left); Cervicogenic headache (Left); Chronic hip pain (Left); Spondylolisthesis at L5-S1 level; Spondylosis of lumbosacral region without myelopathy or radiculopathy; Epidural fibrosis; Enthesopathy of hip region (Left); Chronic sacroiliac joint pain (Left); Enthesopathy of sacroiliac joint (Left); Somatic dysfunction of sacroiliac joint (Left); Chronic sacroiliac joint pain (Right); Osteoarthritis of sacroiliac joint (Right) ([place]); Somatic dysfunction of sacroiliac joint (Right); and Chronic hip pain (Right) on their pertinent problem list. Pain Assessment: Severity of Chronic pain is reported as a 3 /10. Location: Back  / . Onset:  . Quality: Aching, Burning. Timing: Constant. Modifying factor(s): meds, lay down, ice and heat. Vitals:  height is 5' (1.524 m) and weight is 149 lb (67.6 kg). Her temperature is 97 F (36.1 C) (abnormal). Her blood pressure is 133/78 and her pulse is 98. Her oxygen saturation is 97%.   Reason for encounter: medication management.   The patient indicates doing well with the current medication regimen. No adverse reactions or side effects reported to the medications.   Today the patient indicates recurrence of the pain in the lower lumbar area.  She has positive provocative hyperextension and rotation maneuver and Kemp maneuver for facet arthropathy, bilaterally.  The patient had radiofrequency ablation of both of her lumbar facet areas around 2019 with excellent  results that have lasted for a considerable amount of time.  At this point, I believe it to be medically necessary for her to have these repeated.  Or time we have done palliative facet blocks with  good results, but no long-term benefit.  On 10/06/2020 the patient had her anterior cervical discectomy and fusion and since then she is not having any more problems in the cervical region.  She is very happy about this.  She recently went to the [facility] where she was seen by [facility], who did some x-rays of her lower back and recommended to come back to me for that radiofrequency ablation.  Statement of Medical Necessity:  Ms. Corbettcontinues to experienced debilitating chronic nerve-associated pain from the Lumbosacral Facet Syndrome (Spondylosis without myelopathy or radiculopathy, lumbosacral region [M47.817]).  Duration: This pain has persisted for longer than three months.  Non-surgical care: The patient has either failed to respond, or was unable to tolerate, or simply did not get enough benefit from other more conservative therapies including, but not limited to: 1. Over-the-counter oral analgesic medications (i.e.: ibuprofen, naproxen, etc.) 2. Anti-inflammatory medications 3. Muscle relaxants 4. Membrane stabilizers 5. Opioids 6. Physical therapy (PT), chiropractic manipulation, and/or home exercise program (HEP). 7. Modalities (Heat, ice, etc.)  Invasive therapies: Prior radiofrequency procedures have proven to be effective in providing long-term (>6 months) benefit.  Surgical care: Not indicated.  Physical exam: Has been consistent with Lumbosacral Facet Syndrome.  Diagnostic imaging: Lumbosacral Facet Hypertrophy.                Diagnostic interventional therapies: Jacqueline Thornton has attained greater than 50% reduction in pain from at least two (2) diagnostic medial branch blocks conducted in separate occasions.   For the above listed reason, I believe, as the examining  and treating physician, that it is medically necessary to proceed with Non-Pulsed Radiofrequency Ablation for the purpose of attempting to prolong the duration of the benefits seen with the diagnostic injections.  RTCB: 03/13/2021 Nonopioids transferred 05/24/2020: Vitamin B12, Flexeril, and Neurontin.  Pharmacotherapy Assessment  Analgesic: Oxycodone/APAP 7.5/325, 1 tab PO q 5 times a day. (56.25 mg/day of oxycodone) MME/day: 56.25 mg/day.   Monitoring: [place] PMP: PDMP reviewed during this encounter.       Pharmacotherapy: No side-effects or adverse reactions reported. Compliance: No problems identified. Effectiveness: Clinically acceptable.  Chauncey Fischer, RN  12/13/2020  1:53 PM  Sign when Signing Visit Safety precautions to be maintained throughout the outpatient stay will include: orient to surroundings, keep bed in low position, maintain call bell within reach at all times, provide assistance with transfer out of bed and ambulation.  Nursing Pain Medication Assessment:  Safety precautions to be maintained throughout the outpatient stay will include: orient to surroundings, keep bed in low position, maintain call bell within reach at all times, provide assistance with transfer out of bed and ambulation.  Medication Inspection Compliance: Pill count conducted under aseptic conditions, in front of the patient. Neither the pills nor the bottle was removed from the patient's sight at any time. Once count was completed pills were immediately returned to the patient in their original bottle.  Medication: Oxycodone/APAP Pill/Patch Count:  13 of 150 pills remain Pill/Patch Appearance: Markings consistent with prescribed medication Bottle Appearance: Standard pharmacy container. Clearly labeled. Filled Date: 5 / 20 / 2022 Last Medication intake:  Today      UDS:  Summary  Date Value Ref Range Status  12/16/2019 Note  Final    Comment:     ==================================================================== [employer] Select 13 (MW) ==================================================================== Test  Result       Flag       Units  Drug Present and Declared for Prescription Verification   Oxycodone                      3206         EXPECTED   ng/mg creat   Oxymorphone                    1043         EXPECTED   ng/mg creat   Noroxycodone                   3386         EXPECTED   ng/mg creat   Noroxymorphone                 175          EXPECTED   ng/mg creat    Sources of oxycodone are scheduled prescription medications.    Oxymorphone, noroxycodone, and noroxymorphone are expected    metabolites of oxycodone. Oxymorphone is also available as a    scheduled prescription medication.  ==================================================================== Test                      Result    Flag   Units      Ref Range   Creatinine              95               mg/dL      >=20 ==================================================================== Declared Medications:  The flagging and interpretation on this report are based on the  following declared medications.  Unexpected results may arise from  inaccuracies in the declared medications.   **Note: The testing scope of this panel includes these medications:   Oxycodone (Percocet)   **Note: The testing scope of this panel does not include the  following reported medications:   Acetaminophen (Percocet)  Cyclobenzaprine (Flexeril)  Duloxetine (Cymbalta)  Enalapril (Vasotec)  Esomeprazole (Nexium)  Estradiol (Estrace)  Gabapentin (Neurontin)  Hydrochlorothiazide (Hydrodiuril)  Melatonin  Rivaroxaban (Xarelto)  Rosuvastatin (Crestor)  Sertraline (Zoloft)  Vitamin B12  Vitamin D3 ==================================================================== For clinical consultation, please call [phone]. ====================================================================      ROS  Constitutional: Denies any fever or chills Gastrointestinal: No reported hemesis, hematochezia, vomiting, or acute GI distress Musculoskeletal: Denies any acute onset joint swelling, redness, loss of ROM, or weakness Neurological: No reported episodes of acute onset apraxia, aphasia, dysarthria, agnosia, amnesia, paralysis, loss of coordination, or loss of consciousness  Medication Review  Bempedoic Acid, Calcium, DULoxetine, Melatonin, Vitamin B-12, enalapril, esomeprazole, estradiol, furosemide, gabapentin, methocarbamol, multivitamin with minerals, oxyCODONE-acetaminophen, and sertraline  History Review  Allergy: Jacqueline Thornton has No Known Allergies. Drug: Jacqueline Thornton  reports no history of drug use. Alcohol:  reports no history of alcohol use. Tobacco:  reports that she quit smoking about 17 years ago. Her smoking use included cigarettes. She has never used smokeless tobacco. Social: Ms. Kihn  reports that she quit smoking about 17 years ago. Her smoking use included cigarettes. She has never used smokeless tobacco. She reports that she does not drink alcohol and does not use drugs. Medical:  has a past medical history of Absolute anemia (04/11/2015), Acute postoperative pain (08/07/2017), Angina pectoris ([place]), Anxiety, Arthritis, Arthropathy of sacroiliac joint (04/11/2015), Atypical face pain (04/24/2015), Back pain, CAD (coronary artery disease), Cancer ([place]),  Chronic back pain, Constipation, Depression, Fibromyalgia, GERD (gastroesophageal reflux disease), 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) (04/11/2015), H/O: hysterectomy (1979), Heart murmur, Hyperlipidemia, Hypertension, Low back pain (04/06/2015), Lumbar radicular pain (04/18/2015), Migraine, Narrowing of intervertebral disc space (04/11/2015), Sacroiliac joint pain (04/11/2015), and Spine disorder. Surgical: Jacqueline Thornton  has a past surgical  history that includes Cholecystectomy; Abdominal hysterectomy; Neck surgery; Shoulder surgery; Colonoscopy with propofol (N/A, 07/19/2015); caract surger; Laparoscopic salpingo oophorectomy (Bilateral, 03/03/2018); Cystoscopy (03/03/2018); Tonsillectomy; Appendectomy; Tumor removal; Anterior lumbar fusion (N/A, 12/11/2018); Abdominal exposure (N/A, 12/11/2018); and Anterior cervical decomp/discectomy fusion (N/A, 10/06/2020). Family: family history includes Colon cancer in her maternal grandmother and mother; Diabetes in her father and mother; Hypertension in her sister; Stroke in her mother.  Laboratory Chemistry Profile   Renal Lab Results  Component Value Date   BUN 8 10/06/2020   CREATININE 0.92 10/06/2020   BCR 8 (L) 12/16/2019   GFRAA 47 (L) 12/16/2019   GFRNONAA >60 10/06/2020     Hepatic Lab Results  Component Value Date   AST 15 12/16/2019   ALT 14 03/08/2018   ALBUMIN 4.0 12/16/2019   ALKPHOS 60 12/16/2019     Electrolytes Lab Results  Component Value Date   NA 137 10/06/2020   K 3.6 10/06/2020   CL 105 10/06/2020   CALCIUM 8.6 (L) 10/06/2020   MG 2.4 (H) 12/16/2019     Bone Lab Results  Component Value Date   25OHVITD1 53 12/16/2019   25OHVITD2 <1.0 12/16/2019   25OHVITD3 53 12/16/2019     Inflammation (CRP: Acute Phase) (ESR: Chronic Phase) Lab Results  Component Value Date   CRP 4 12/16/2019   ESRSEDRATE 6 12/16/2019       Note: Above Lab results reviewed.  Recent Imaging Review  DG PAIN CLINIC C-ARM 1-60 MIN NO REPORT Fluoro was used, but no Radiologist interpretation will be provided.  Please refer to "NOTES" tab for provider progress note. Note: Reviewed        Physical Exam  General appearance: Well nourished, well developed, and well hydrated. In no apparent acute distress Mental status: Alert, oriented x 3 (person, place, & time)       Respiratory: No evidence of acute respiratory distress Eyes: PERLA Vitals: BP 133/78   Pulse 98   Temp (!) 97  F (36.1 C)   Ht 5' (1.524 m)   Wt 149 lb (67.6 kg)   SpO2 97%   BMI 29.10 kg/m  BMI: Estimated body mass index is 29.1 kg/m as calculated from the following:   Height as of this encounter: 5' (1.524 m).   Weight as of this encounter: 149 lb (67.6 kg). Ideal: Ideal body weight: 45.5 kg (100 lb 4.9 oz) Adjusted ideal body weight: 54.3 kg (119 lb 12.6 oz)  Assessment   Status Diagnosis  Controlled Controlled Controlled 1. Lumbar facet syndrome (Bilateral) (R>L)   2. Lumbar facet hypertrophy   3. Chronic low back pain (2ry area of Pain) (Bilateral) (R>L) w/o sciatica   4. Spondylosis without myelopathy or radiculopathy, lumbosacral region   5. Chronic pain syndrome   6. Pharmacologic therapy   7. Chronic use of opiate for therapeutic purpose      Updated Problems: No problems updated.   Plan of Care  Problem-specific:  No problem-specific Assessment & Plan notes found for this encounter.  Jacqueline Thornton has a current medication list which includes the following long-term medication(s): calcium, duloxetine, enalapril, esomeprazole, furosemide, oxycodone-acetaminophen, [START ON 01/12/2021] oxycodone-acetaminophen, [START ON 02/11/2021] oxycodone-acetaminophen,  sertraline, vitamin b-12, and gabapentin.  Pharmacotherapy (Medications Ordered): Meds ordered this encounter  Medications   oxyCODONE-acetaminophen (PERCOCET) 7.5-325 MG tablet    Sig: Take 1 tablet by mouth 5 (five) times daily. Must last 30 days    Dispense:  150 tablet    Refill:  0    Not a duplicate. Do NOT delete! Dispense 1 day early if closed on refill date. Avoid benzodiazepines within 8 hours of opioids. Do not send refill requests.   oxyCODONE-acetaminophen (PERCOCET) 7.5-325 MG tablet    Sig: Take 1 tablet by mouth 5 (five) times daily. Must last 30 days    Dispense:  150 tablet    Refill:  0    Not a duplicate. Do NOT delete! Dispense 1 day early if closed on refill date. Avoid benzodiazepines  within 8 hours of opioids. Do not send refill requests.   oxyCODONE-acetaminophen (PERCOCET) 7.5-325 MG tablet    Sig: Take 1 tablet by mouth 5 (five) times daily. Must last 30 days    Dispense:  150 tablet    Refill:  0    Not a duplicate. Do NOT delete! Dispense 1 day early if closed on refill date. Avoid benzodiazepines within 8 hours of opioids. Do not send refill requests.    Orders:  Orders Placed This Encounter  Procedures   Radiofrequency,Lumbar    Standing Status:   Future    Standing Expiration Date:   12/13/2021    Scheduling Instructions:     Side(s): Left-sided     Level: L3-4, L4-5, & L5-S1 Facets (L2, L3, L4, L5, & S1 Medial Branch Nerves)     Sedation: Patient's choice.     Scheduling Timeframe: As soon as pre-approved    Order Specific Question:   Where will this procedure be performed?    Answer:   [facility]   [employer] Select 13 (MW), Urine    Volume: 30 ml(s). Minimum 3 ml of urine is needed. Document temperature of fresh sample. Indications: Long term (current) use of opiate analgesic (S93.734)    Order Specific Question:   Release to patient    Answer:   Immediate   Follow-up plan:   Return for RFA (54mn): (L) L-FCT RFA #2.     Interventional Therapies  Risk  Complexity Considerations:   WNL   Planned  Pending:   (L) L-FCT RFA   Under consideration:   Diagnostic caudal diagnostic epidurogram Diagnostic CESI  Diagnostic bilateral cervical facet block  Possible bilateral cervical facet RFA  Diagnostic left L4-5 LESI  Diagnostic left S1 SNRB    Completed:   Therapeutic right Racz procedure x1 (08/17/2020) (100% relief of the right lower extremity pain) Diagnostic caudal ESI x1 (07/11/2020) Palliative left L5-S1 LESI x2 (07/27/2015)  Diagnostic left lumbar facet MBB x6 (05/14/2018)  Palliative right lumbar facet MBB x9 (06/13/2020)  Therapeutic right shoulder joint injection x1 (02/29/2016)  Therapeutic left IA hip injection x3  (06/13/2020)  Diagnostic right SI joint block x3 (06/17/2017)  Diagnostic/therapeutic left SI joint block x4 (06/17/2017)  Palliative bilateral trochanteric bursa injection x1 (12/11/2016)  Therapeutic right lumbar facet MB RFA x1 (08/07/2017)  Therapeutic left lumbar facet MB RFA x1 (09/16/2017)  Therapeutic right SI joint RFA x1 (08/07/2017)  Therapeutic left SI joint RFA x1 (09/16/2017)    Therapeutic  Palliative (PRN) options:   Palliative Racz procedure  Palliative bilateral lumbar facet block  Palliative bilateral SI joint block  Palliative right trochanteric bursa injection  Recent Visits Date Type Provider Dept  12/13/20 Office Visit Milinda Pointer, MD [facility]  11/28/20 Procedure visit Milinda Pointer, MD [facility]  Showing recent visits within past 90 days and meeting all other requirements Future Appointments Date Type Provider Dept  12/19/20 Appointment Milinda Pointer, MD [facility]  01/08/21 Appointment Milinda Pointer, MD [facility]  03/12/21 Appointment Milinda Pointer, MD [facility]  Showing future appointments within next 90 days and meeting all other requirements I discussed the assessment and treatment plan with the patient. The patient was provided an opportunity to ask questions and all were answered. The patient agreed with the plan and demonstrated an understanding of the instructions.  Patient advised to call back or seek an in-person evaluation if the symptoms or condition worsens.  Duration of encounter: 50 minutes.  Note by: Gaspar Cola, MD Date: 12/13/2020; Time: 12:50 PM

## 2020-12-19 ENCOUNTER — Other Ambulatory Visit: Payer: Self-pay

## 2020-12-19 ENCOUNTER — Encounter: Payer: Self-pay | Admitting: Pain Medicine

## 2020-12-19 ENCOUNTER — Ambulatory Visit (HOSPITAL_BASED_OUTPATIENT_CLINIC_OR_DEPARTMENT_OTHER): Payer: Medicare Other | Admitting: Pain Medicine

## 2020-12-19 ENCOUNTER — Ambulatory Visit
Admission: RE | Admit: 2020-12-19 | Discharge: 2020-12-19 | Disposition: A | Discharge status: Home / Self Care | Payer: Medicare Other | Source: Ambulatory Visit | Attending: Pain Medicine | Admitting: Pain Medicine

## 2020-12-19 VITALS — BP 120/70 | HR 71 | Temp 96.9°F | Resp 17 | Ht 60.0 in | Wt 146.0 lb

## 2020-12-19 DIAGNOSIS — M47817 Spondylosis without myelopathy or radiculopathy, lumbosacral region: Secondary | ICD-10-CM | POA: Diagnosis not present

## 2020-12-19 DIAGNOSIS — G8929 Other chronic pain: Secondary | ICD-10-CM | POA: Diagnosis present

## 2020-12-19 DIAGNOSIS — M47816 Spondylosis without myelopathy or radiculopathy, lumbar region: Secondary | ICD-10-CM | POA: Diagnosis not present

## 2020-12-19 DIAGNOSIS — G8918 Other acute postprocedural pain: Secondary | ICD-10-CM | POA: Diagnosis present

## 2020-12-19 DIAGNOSIS — M5136 Other intervertebral disc degeneration, lumbar region: Secondary | ICD-10-CM | POA: Insufficient documentation

## 2020-12-19 DIAGNOSIS — M545 Low back pain, unspecified: Secondary | ICD-10-CM

## 2020-12-19 MED ORDER — LIDOCAINE HCL (PF) 2 % IJ SOLN
INTRAMUSCULAR | Status: AC
  Filled 2020-12-19: qty 5

## 2020-12-19 MED ORDER — MIDAZOLAM HCL 5 MG/5ML IJ SOLN
1.0000 mg | INTRAMUSCULAR | Status: DC | PRN
  Administered 2020-12-19 (×2): 1 mg via INTRAVENOUS
  Filled 2020-12-19: qty 5

## 2020-12-19 MED ORDER — ROPIVACAINE HCL 2 MG/ML IJ SOLN
INTRAMUSCULAR | Status: AC
  Filled 2020-12-19: qty 20

## 2020-12-19 MED ORDER — FENTANYL CITRATE (PF) 100 MCG/2ML IJ SOLN
25.0000 ug | INTRAMUSCULAR | Status: DC | PRN
  Filled 2020-12-19: qty 2

## 2020-12-19 MED ORDER — LACTATED RINGERS IV SOLN
1000.0000 mL | Freq: Once | INTRAVENOUS | Status: AC
  Administered 2020-12-19: 1000 mL via INTRAVENOUS

## 2020-12-19 MED ORDER — HYDROCODONE-ACETAMINOPHEN 5-325 MG PO TABS: 1.0000 | ORAL_TABLET | Freq: Three times a day (TID) | ORAL | 0 refills | Status: AC | PRN

## 2020-12-19 MED ORDER — ROPIVACAINE HCL 2 MG/ML IJ SOLN
9.0000 mL | Freq: Once | INTRAMUSCULAR | Status: AC
  Administered 2020-12-19: 9 mL via PERINEURAL

## 2020-12-19 MED ORDER — TRIAMCINOLONE ACETONIDE 40 MG/ML IJ SUSP
40.0000 mg | Freq: Once | INTRAMUSCULAR | Status: AC
  Administered 2020-12-19: 40 mg
  Filled 2020-12-19: qty 1

## 2020-12-19 MED ORDER — LIDOCAINE HCL 2 % IJ SOLN
20.0000 mL | Freq: Once | INTRAMUSCULAR | Status: AC
  Administered 2020-12-19: 400 mg

## 2020-12-19 NOTE — Progress Notes (Signed)
Safety precautions to be maintained throughout the outpatient stay will include: orient to surroundings, keep bed in low position, maintain call bell within reach at all times, provide assistance with transfer out of bed and ambulation.  

## 2020-12-19 NOTE — Progress Notes (Signed)
PROVIDER NOTE: Information contained herein reflects review and annotations entered in association with encounter. Interpretation of such information and data should be left to medically-trained personnel. Information provided to patient can be located elsewhere in the medical record under "Patient Instructions". Document created using STT-dictation technology, any transcriptional errors that may result from process are unintentional.    Patient: Jacqueline Thornton  Service Category: Procedure  Provider: Gaspar Cola, MD  DOB: 17-Oct-1950  DOS: 12/19/2020  Location: [facility]  MRN: [id number]  Setting: Ambulatory - outpatient  Referring Provider: Perrin Maltese, MD  Type: Established Patient  Specialty: Interventional Pain Management  PCP: Perrin Maltese, MD   Primary Reason for Visit: Interventional Pain Management Treatment. CC: Back Pain (lower)  Procedure:          Anesthesia, Analgesia, Anxiolysis:  Type: Thermal Lumbar Facet, Medial Branch Radiofrequency Ablation/Neurotomy  #2  Primary Purpose: Therapeutic Region: Posterolateral Lumbosacral Spine Level: L2, L3, L4, L5, & S1 Medial Branch Level(s). These levels will denervate the L3-4, L4-5, and the L5-S1 lumbar facet joints. Laterality: Left  Type: Moderate (Conscious) Sedation combined with Local Anesthesia Indication(s): Analgesia and Anxiety Route: Intravenous (IV) IV Access: Secured Sedation: Meaningful verbal contact was maintained at all times during the procedure  Local Anesthetic: Lidocaine 1-2%  Position: Prone   Indications: 1. Lumbar facet syndrome (Bilateral) (R>L)   2. Lumbar facet hypertrophy   3. Spondylosis without myelopathy or radiculopathy, lumbosacral region   4. DDD (degenerative disc disease), lumbar   5. Chronic low back pain (2ry area of Pain) (Bilateral) (R>L) w/o sciatica    Jacqueline Thornton has been dealing with the above chronic pain for longer than three months and has either failed to  respond, was unable to tolerate, or simply did not get enough benefit from other more conservative therapies including, but not limited to: 1. Over-the-counter medications 2. Anti-inflammatory medications 3. Muscle relaxants 4. Membrane stabilizers 5. Opioids 6. Physical therapy and/or chiropractic manipulation 7. Modalities (Heat, ice, etc.) 8. Invasive techniques such as nerve blocks. Jacqueline Thornton has attained more than 50% relief of the pain from a series of diagnostic injections conducted in separate occasions.  Pain Score: Pre-procedure: 4 /10 Post-procedure: 0-No pain/10  Pre-op H&P Assessment:  Jacqueline Thornton is a 70 y.o. (year old), female patient, seen today for interventional treatment. She  has a past surgical history that includes Cholecystectomy; Abdominal hysterectomy; Neck surgery; Shoulder surgery; Colonoscopy with propofol (N/A, 07/19/2015); caract surger; Laparoscopic salpingo oophorectomy (Bilateral, 03/03/2018); Cystoscopy (03/03/2018); Tonsillectomy; Appendectomy; Tumor removal; Anterior lumbar fusion (N/A, 12/11/2018); Abdominal exposure (N/A, 12/11/2018); and Anterior cervical decomp/discectomy fusion (N/A, 10/06/2020). Jacqueline Thornton has a current medication list which includes the following prescription(s): calcium, duloxetine, enalapril, esomeprazole, estradiol, furosemide, hydrocodone-acetaminophen, [START ON 12/26/2020] hydrocodone-acetaminophen, melatonin, methocarbamol, multivitamin with minerals, oxycodone-acetaminophen, [START ON 01/12/2021] oxycodone-acetaminophen, [START ON 02/11/2021] oxycodone-acetaminophen, sertraline, nexletol, vitamin b-12, and gabapentin, and the following Facility-Administered Medications: fentanyl and midazolam. Her primarily concern today is the Back Pain (lower)  Initial Vital Signs:  Pulse/HCG Rate: 82ECG Heart Rate: 75 Temp: (!) 96.8 F (36 C) Resp: 16 BP: (!) 106/48 SpO2: 98 %  BMI: Estimated body mass index is 28.51 kg/m as calculated from the  following:   Height as of this encounter: 5' (1.524 m).   Weight as of this encounter: 146 lb (66.2 kg).  Risk Assessment: Allergies: Reviewed. She has No Known Allergies.  Allergy Precautions: None required Coagulopathies: Reviewed. None identified.  Blood-thinner therapy: None at this time Active  Infection(s): Reviewed. None identified. Jacqueline Thornton is afebrile  Site Confirmation: Jacqueline Thornton was asked to confirm the procedure and laterality before marking the site Procedure checklist: Completed Consent: Before the procedure and under the influence of no sedative(s), amnesic(s), or anxiolytics, the patient was informed of the treatment options, risks and possible complications. To fulfill our ethical and legal obligations, as recommended by the American Medical Association's Code of Ethics, I have informed the patient of my clinical impression; the nature and purpose of the treatment or procedure; the risks, benefits, and possible complications of the intervention; the alternatives, including doing nothing; the risk(s) and benefit(s) of the alternative treatment(s) or procedure(s); and the risk(s) and benefit(s) of doing nothing. The patient was provided information about the general risks and possible complications associated with the procedure. These may include, but are not limited to: failure to achieve desired goals, infection, bleeding, organ or nerve damage, allergic reactions, paralysis, and death. In addition, the patient was informed of those risks and complications associated to Spine-related procedures, such as failure to decrease pain; infection (i.e.: Meningitis, epidural or intraspinal abscess); bleeding (i.e.: epidural hematoma, subarachnoid hemorrhage, or any other type of intraspinal or peri-dural bleeding); organ or nerve damage (i.e.: Any type of peripheral nerve, nerve root, or spinal cord injury) with subsequent damage to sensory, motor, and/or autonomic systems, resulting in  permanent pain, numbness, and/or weakness of one or several areas of the body; allergic reactions; (i.e.: anaphylactic reaction); and/or death. Furthermore, the patient was informed of those risks and complications associated with the medications. These include, but are not limited to: allergic reactions (i.e.: anaphylactic or anaphylactoid reaction(s)); adrenal axis suppression; blood sugar elevation that in diabetics may result in ketoacidosis or comma; water retention that in patients with history of congestive heart failure may result in shortness of breath, pulmonary edema, and decompensation with resultant heart failure; weight gain; swelling or edema; medication-induced neural toxicity; particulate matter embolism and blood vessel occlusion with resultant organ, and/or nervous system infarction; and/or aseptic necrosis of one or more joints. Finally, the patient was informed that Medicine is not an exact science; therefore, there is also the possibility of unforeseen or unpredictable risks and/or possible complications that may result in a catastrophic outcome. The patient indicated having understood very clearly. We have given the patient no guarantees and we have made no promises. Enough time was given to the patient to ask questions, all of which were answered to the patient's satisfaction. Jacqueline Thornton has indicated that she wanted to continue with the procedure. Attestation: I, the ordering provider, attest that I have discussed with the patient the benefits, risks, side-effects, alternatives, likelihood of achieving goals, and potential problems during recovery for the procedure that I have provided informed consent. Date  Time: 12/19/2020 12:54 PM  Pre-Procedure Preparation:  Monitoring: As per clinic protocol. Respiration, ETCO2, SpO2, BP, heart rate and rhythm monitor placed and checked for adequate function Safety Precautions: Patient was assessed for positional comfort and pressure points  before starting the procedure. Time-out: I initiated and conducted the "Time-out" before starting the procedure, as per protocol. The patient was asked to participate by confirming the accuracy of the "Time Out" information. Verification of the correct person, site, and procedure were performed and confirmed by me, the nursing staff, and the patient. "Time-out" conducted as per Joint Commission's Universal Protocol (UP.01.01.01). Time: 1331  Description of Procedure:          Laterality: Left Levels:  L2, L3, L4, L5, & S1 Medial  Branch Level(s), at the L3-4, L4-5, and the L5-S1 lumbar facet joints. Area Prepped: Lumbosacral DuraPrep (Iodine Povacrylex [0.7% available iodine] and Isopropyl Alcohol, 74% w/w) Safety Precautions: Aspiration looking for blood return was conducted prior to all injections. At no point did we inject any substances, as a needle was being advanced. Before injecting, the patient was told to immediately notify me if she was experiencing any new onset of "ringing in the ears, or metallic taste in the mouth". No attempts were made at seeking any paresthesias. Safe injection practices and needle disposal techniques used. Medications properly checked for expiration dates. SDV (single dose vial) medications used. After the completion of the procedure, all disposable equipment used was discarded in the proper designated medical waste containers. Local Anesthesia: Protocol guidelines were followed. The patient was positioned over the fluoroscopy table. The area was prepped in the usual manner. The time-out was completed. The target area was identified using fluoroscopy. A 12-in long, straight, sterile hemostat was used with fluoroscopic guidance to locate the targets for each level blocked. Once located, the skin was marked with an approved surgical skin marker. Once all sites were marked, the skin (epidermis, dermis, and hypodermis), as well as deeper tissues (fat, connective tissue and  muscle) were infiltrated with a small amount of a short-acting local anesthetic, loaded on a 10cc syringe with a 25G, 1.5-in  Needle. An appropriate amount of time was allowed for local anesthetics to take effect before proceeding to the next step. Local Anesthetic: Lidocaine 2.0% The unused portion of the local anesthetic was discarded in the proper designated containers. Technical explanation of process:  Radiofrequency Ablation (RFA) L2 Medial Branch Nerve RFA: The target area for the L2 medial branch is at the junction of the postero-lateral aspect of the superior articular process and the superior, posterior, and medial edge of the transverse process of L3. Under fluoroscopic guidance, a Radiofrequency needle was inserted until contact was made with os over the superior postero-lateral aspect of the pedicular shadow (target area). Sensory and motor testing was conducted to properly adjust the position of the needle. Once satisfactory placement of the needle was achieved, the numbing solution was slowly injected after negative aspiration for blood. 2.0 mL of the nerve block solution was injected without difficulty or complication. After waiting for at least 3 minutes, the ablation was performed. Once completed, the needle was removed intact. L3 Medial Branch Nerve RFA: The target area for the L3 medial branch is at the junction of the postero-lateral aspect of the superior articular process and the superior, posterior, and medial edge of the transverse process of L4. Under fluoroscopic guidance, a Radiofrequency needle was inserted until contact was made with os over the superior postero-lateral aspect of the pedicular shadow (target area). Sensory and motor testing was conducted to properly adjust the position of the needle. Once satisfactory placement of the needle was achieved, the numbing solution was slowly injected after negative aspiration for blood. 2.0 mL of the nerve block solution was injected  without difficulty or complication. After waiting for at least 3 minutes, the ablation was performed. Once completed, the needle was removed intact. L4 Medial Branch Nerve RFA: The target area for the L4 medial branch is at the junction of the postero-lateral aspect of the superior articular process and the superior, posterior, and medial edge of the transverse process of L5. Under fluoroscopic guidance, a Radiofrequency needle was inserted until contact was made with os over the superior postero-lateral aspect of the pedicular shadow (  target area). Sensory and motor testing was conducted to properly adjust the position of the needle. Once satisfactory placement of the needle was achieved, the numbing solution was slowly injected after negative aspiration for blood. 2.0 mL of the nerve block solution was injected without difficulty or complication. After waiting for at least 3 minutes, the ablation was performed. Once completed, the needle was removed intact. L5 Medial Branch Nerve RFA: The target area for the L5 medial branch is at the junction of the postero-lateral aspect of the superior articular process of S1 and the superior, posterior, and medial edge of the sacral ala. Under fluoroscopic guidance, a Radiofrequency needle was inserted until contact was made with os over the superior postero-lateral aspect of the pedicular shadow (target area). Sensory and motor testing was conducted to properly adjust the position of the needle. Once satisfactory placement of the needle was achieved, the numbing solution was slowly injected after negative aspiration for blood. 2.0 mL of the nerve block solution was injected without difficulty or complication. After waiting for at least 3 minutes, the ablation was performed. Once completed, the needle was removed intact. S1 Medial Branch Nerve RFA: The target area for the S1 medial branch is located inferior to the junction of the S1 superior articular process and the L5  inferior articular process, posterior, inferior, and lateral to the 6 o'clock position of the L5-S1 facet joint, just superior to the S1 posterior foramen. Under fluoroscopic guidance, the Radiofrequency needle was advanced until contact was made with os over the Target area. Sensory and motor testing was conducted to properly adjust the position of the needle. Once satisfactory placement of the needle was achieved, the numbing solution was slowly injected after negative aspiration for blood. 2.0 mL of the nerve block solution was injected without difficulty or complication. After waiting for at least 3 minutes, the ablation was performed. Once completed, the needle was removed intact. Radiofrequency lesioning (ablation):  Radiofrequency Generator: NeuroTherm [id number] Sensory Stimulation Parameters: 50 Hz was used to locate & identify the nerve, making sure that the needle was positioned such that there was no sensory stimulation below 0.3 V or above 0.7 V. Motor Stimulation Parameters: 2 Hz was used to evaluate the motor component. Care was taken not to lesion any nerves that demonstrated motor stimulation of the lower extremities at an output of less than 2.5 times that of the sensory threshold, or a maximum of 2.0 V. Lesioning Technique Parameters: Standard Radiofrequency settings. (Not bipolar or pulsed.) Temperature Settings: 80 degrees C Lesioning time: 60 seconds Intra-operative Compliance: Compliant Materials & Medications: Needle(s) (Electrode/Cannula) Type: Teflon-coated, curved tip, Radiofrequency needle(s) Gauge: 22G Length: 10cm Numbing solution: 0.2% PF-Ropivacaine + Triamcinolone (40 mg/mL) diluted to a final concentration of 4 mg of Triamcinolone/mL of Ropivacaine The unused portion of the solution was discarded in the proper designated containers.  Once the entire procedure was completed, the treated area was cleaned, making sure to leave some of the prepping solution back to take  advantage of its long term bactericidal properties.    Illustration of the posterior view of the lumbar spine and the posterior neural structures. Laminae of L2 through S1 are labeled. DPRL5, dorsal primary ramus of L5; DPRS1, dorsal primary ramus of S1; DPR3, dorsal primary ramus of L3; FJ, facet (zygapophyseal) joint L3-L4; I, inferior articular process of L4; LB1, lateral branch of dorsal primary ramus of L1; IAB, inferior articular branches from L3 medial branch (supplies L4-L5 facet joint); IBP, intermediate branch plexus; MB3, medial  branch of dorsal primary ramus of L3; NR3, third lumbar nerve root; S, superior articular process of L5; SAB, superior articular branches from L4 (supplies L4-5 facet joint also); TP3, transverse process of L3.  Vitals:   12/19/20 1405 12/19/20 1415 12/19/20 1425 12/19/20 1437  BP: 123/69 (!) 129/92 116/69 120/70  Pulse: 71     Resp: 16 16 (!) 21 17  Temp:  (!) 96.8 F (36 C)  (!) 96.9 F (36.1 C)  TempSrc:      SpO2: 96% 99% 98% 99%  Weight:      Height:       Start Time: 1331 hrs. End Time: 1404 hrs.  Imaging Guidance (Spinal):          Type of Imaging Technique: Fluoroscopy Guidance (Spinal) Indication(s): Assistance in needle guidance and placement for procedures requiring needle placement in or near specific anatomical locations not easily accessible without such assistance. Exposure Time: Please see nurses notes. Contrast: None used. Fluoroscopic Guidance: I was personally present during the use of fluoroscopy. "Tunnel Vision Technique" used to obtain the best possible view of the target area. Parallax error corrected before commencing the procedure. "Direction-depth-direction" technique used to introduce the needle under continuous pulsed fluoroscopy. Once target was reached, antero-posterior, oblique, and lateral fluoroscopic projection used confirm needle placement in all planes. Images permanently stored in EMR. Interpretation: No contrast  injected. I personally interpreted the imaging intraoperatively. Adequate needle placement confirmed in multiple planes. Permanent images saved into the patient's record.  Antibiotic Prophylaxis:   Anti-infectives (From admission, onward)    None      Indication(s): None identified  Post-operative Assessment:  Post-procedure Vital Signs:  Pulse/HCG Rate: 71 (nsr)79 Temp: (!) 96.9 F (36.1 C) Resp: 17 BP: 120/70 SpO2: 99 %  EBL: None  Complications: No immediate post-treatment complications observed by team, or reported by patient.  Note: The patient tolerated the entire procedure well. A repeat set of vitals were taken after the procedure and the patient was kept under observation following institutional policy, for this type of procedure. Post-procedural neurological assessment was performed, showing return to baseline, prior to discharge. The patient was provided with post-procedure discharge instructions, including a section on how to identify potential problems. Should any problems arise concerning this procedure, the patient was given instructions to immediately contact us, at any time, without hesitation. In any case, we plan to contact the patient by telephone for a follow-up status report regarding this interventional procedure.  Comments:  No additional relevant information.  Plan of Care  Orders:  Orders Placed This Encounter  Procedures   Radiofrequency,Lumbar    Scheduling Instructions:     Side(s): Left-sided     Level: L3-4, L4-5, & L5-S1 Facets (L2, L3, L4, L5, & S1 Medial Branch Nerves)     Sedation: Patient's choice.     Timeframe: Today    Order Specific Question:   Where will this procedure be performed?    Answer:   [facility]   Radiofrequency,Lumbar    Standing Status:   Future    Standing Expiration Date:   12/19/2021    Scheduling Instructions:     Side(s): Right-sided     Level: L3-4, L4-5, & L5-S1 Facets (L2, L3, L4, L5, & S1 Medial  Branch Nerves)     Sedation: Patient's choice.     Scheduling Timeframe: 2 weeks from now    Order Specific Question:   Where will this procedure be performed?    Answer:  [facility]   DG PAIN CLINIC C-ARM 1-60 MIN NO REPORT    Intraoperative interpretation by procedural physician at [place].    Standing Status:   Standing    Number of Occurrences:   1    Order Specific Question:   Reason for exam:    Answer:   Assistance in needle guidance and placement for procedures requiring needle placement in or near specific anatomical locations not easily accessible without such assistance.   Informed Consent Details: Physician/Practitioner Attestation; Transcribe to consent form and obtain patient signature    Nursing Order: Transcribe to consent form and obtain patient signature. Note: Always confirm laterality of pain with Jacqueline Thornton, before procedure.    Order Specific Question:   Physician/Practitioner attestation of informed consent for procedure/surgical case    Answer:   I, the physician/practitioner, attest that I have discussed with the patient the benefits, risks, side effects, alternatives, likelihood of achieving goals and potential problems during recovery for the procedure that I have provided informed consent.    Order Specific Question:   Procedure    Answer:   Lumbar Facet Radiofrequency Ablation    Order Specific Question:   Physician/Practitioner performing the procedure    Answer:   Miller Edgington A. Dossie Arbour, MD    Order Specific Question:   Indication/Reason    Answer:   Low Back Pain, with our without leg pain, due to Facet Joint Arthralgia (Joint Pain) known as Lumbar Facet Syndrome, secondary to Lumbar, and/or Lumbosacral Spondylosis (Arthritis of the Spine), without myelopathy or radiculopathy (Nerve Damage).   Provide equipment / supplies at bedside    "Radiofrequency Tray"; Large hemostat (x1); Small hemostat (x1); Towels (x8); 4x4 sterile sponge pack  (x1) Needle type: Teflon-coated Radiofrequency Needle (Disposable  single use) Size: Regular Quantity: 5    Standing Status:   Standing    Number of Occurrences:   1    Order Specific Question:   Specify    Answer:   Radiofrequency Tray    Chronic Opioid Analgesic:  Oxycodone/APAP 7.5/325, 1 tab PO q 5 times a day. (56.25 mg/day of oxycodone) MME/day: 56.25 mg/day.   Medications ordered for procedure: Meds ordered this encounter  Medications   lidocaine (XYLOCAINE) 2 % (with pres) injection 400 mg   lactated ringers infusion 1,000 mL   midazolam (VERSED) 5 MG/5ML injection 1-2 mg    Make sure Flumazenil is available in the pyxis when using this medication. If oversedation occurs, administer 0.2 mg IV over 15 sec. If after 45 sec no response, administer 0.2 mg again over 1 min; may repeat at 1 min intervals; not to exceed 4 doses (1 mg)   fentaNYL (SUBLIMAZE) injection 25-50 mcg    Make sure Narcan is available in the pyxis when using this medication. In the event of respiratory depression (RR< 8/min): Titrate NARCAN (naloxone) in increments of 0.1 to 0.2 mg IV at 2-3 minute intervals, until desired degree of reversal.   ropivacaine (PF) 2 mg/mL (0.2%) (NAROPIN) injection 9 mL   triamcinolone acetonide (KENALOG-40) injection 40 mg   HYDROcodone-acetaminophen (NORCO/VICODIN) 5-325 MG tablet    Sig: Take 1 tablet by mouth every 8 (eight) hours as needed for up to 7 days for severe pain. Must last 7 days.    Dispense:  21 tablet    Refill:  0    For acute post-operative pain. Not to be refilled. Must last 7 days.   HYDROcodone-acetaminophen (NORCO/VICODIN) 5-325 MG tablet    Sig:  Take 1 tablet by mouth every 8 (eight) hours as needed for up to 7 days for severe pain. Must last for 7 days.    Dispense:  21 tablet    Refill:  0    For acute post-operative pain. Not to be refilled. Must last 7 days.    Medications administered: We administered lidocaine, lactated ringers, midazolam,  ropivacaine (PF) 2 mg/mL (0.2%), and triamcinolone acetonide.  See the medical record for exact dosing, route, and time of administration.  Follow-up plan:   Return in about 2 weeks (around 01/02/2021) for RFA (34min): (R) L-FCT RFA.      Interventional Therapies  Risk  Complexity Considerations:   WNL   Planned  Pending:   (L) L-FCT RFA   Under consideration:   Diagnostic caudal diagnostic epidurogram Diagnostic CESI  Diagnostic bilateral cervical facet block  Possible bilateral cervical facet RFA  Diagnostic left L4-5 LESI  Diagnostic left S1 SNRB    Completed:   Therapeutic right Racz procedure x1 (08/17/2020) (100% relief of the right lower extremity pain) Diagnostic caudal ESI x1 (07/11/2020) Palliative left L5-S1 LESI x2 (07/27/2015)  Diagnostic left lumbar facet MBB x6 (05/14/2018)  Palliative right lumbar facet MBB x9 (06/13/2020)  Therapeutic right shoulder joint injection x1 (02/29/2016)  Therapeutic left IA hip injection x3 (06/13/2020)  Diagnostic right SI joint block x3 (06/17/2017)  Diagnostic/therapeutic left SI joint block x4 (06/17/2017)  Palliative bilateral trochanteric bursa injection x1 (12/11/2016)  Therapeutic right lumbar facet MB RFA x1 (08/07/2017)  Therapeutic left lumbar facet MB RFA x1 (09/16/2017)  Therapeutic right SI joint RFA x1 (08/07/2017)  Therapeutic left SI joint RFA x1 (09/16/2017)    Therapeutic  Palliative (PRN) options:   Palliative Racz procedure  Palliative bilateral lumbar facet block  Palliative bilateral SI joint block  Palliative right trochanteric bursa injection        Recent Visits Date Type Provider Dept  12/13/20 Office Visit Milinda Pointer, MD [facility]  11/28/20 Procedure visit Milinda Pointer, MD [facility]  Showing recent visits within past 90 days and meeting all other requirements Today's Visits Date Type Provider Dept  12/19/20 Procedure visit Milinda Pointer, MD [facility]   Showing today's visits and meeting all other requirements Future Appointments Date Type Provider Dept  01/11/21 Appointment Milinda Pointer, MD [facility]  02/21/21 Appointment Milinda Pointer, MD [facility]  Showing future appointments within next 90 days and meeting all other requirements Disposition: Discharge home  Discharge (Date  Time): 12/19/2020; 1442 hrs.   Primary Care Physician: Perrin Maltese, MD Location: [facility] Note by: Gaspar Cola, MD Date: 12/19/2020; Time: 3:18 PM  Disclaimer:  Medicine is not an Chief Strategy Officer. The only guarantee in medicine is that nothing is guaranteed. It is important to note that the decision to proceed with this intervention was based on the information collected from the patient. The Data and conclusions were drawn from the patient's questionnaire, the interview, and the physical examination. Because the information was provided in large part by the patient, it cannot be guaranteed that it has not been purposely or unconsciously manipulated. Every effort has been made to obtain as much relevant data as possible for this evaluation. It is important to note that the conclusions that lead to this procedure are derived in large part from the available data. Always take into account that the treatment will also be dependent on availability of resources and existing treatment guidelines, considered by  other Pain Management Practitioners as being common knowledge and practice, at the time of the intervention. For Medico-Legal purposes, it is also important to point out that variation in procedural techniques and pharmacological choices are the acceptable norm. The indications, contraindications, technique, and results of the above procedure should only be interpreted and judged by a Board-Certified Interventional Pain Specialist with extensive familiarity and expertise in the same exact procedure  and technique.

## 2020-12-19 NOTE — Patient Instructions (Signed)

## 2020-12-20 ENCOUNTER — Telehealth: Payer: Self-pay

## 2020-12-20 NOTE — Telephone Encounter (Signed)
Attempted to call patient to go over post proceduer.  Line busy and was unable to leave a message.

## 2020-12-21 LAB — TOXASSURE SELECT 13 (MW), URINE

## 2021-01-08 ENCOUNTER — Ambulatory Visit: Payer: Medicare Other | Admitting: Pain Medicine

## 2021-01-10 NOTE — Progress Notes (Signed)
PROVIDER NOTE: Information contained herein reflects review and annotations entered in association with encounter. Interpretation of such information and data should be left to medically-trained personnel. Information provided to patient can be located elsewhere in the medical record under "Patient Instructions". Document created using STT-dictation technology, any transcriptional errors that may result from process are unintentional.    Patient: Jacqueline Thornton  Service Category: Procedure  Provider: Gaspar Cola, MD  DOB: 09-27-1950  DOS: 01/11/2021  Location: [facility]  MRN: [id number]  Setting: Ambulatory - outpatient  Referring Provider: Perrin Maltese, MD  Type: Established Patient  Specialty: Interventional Pain Management  PCP: Perrin Maltese, MD   Primary Reason for Visit: Interventional Pain Management Treatment. CC: Back Pain (Right lower)  Procedure:          Anesthesia, Analgesia, Anxiolysis:  Type: Thermal Lumbar Facet, Medial Branch Radiofrequency Ablation/Neurotomy           Primary Purpose: Therapeutic Region: Posterolateral Lumbosacral Spine Level: L2, L3, L4, L5, & S1 Medial Branch Level(s). These levels will denervate the L3-4, L4-5, and the L5-S1 lumbar facet joints. Laterality: Right  Type: Minimal (Conscious) Anxiolysis combined with Local Anesthesia Indication(s): Analgesia and Anxiety Route: Intravenous (IV) IV Access: Secured Sedation: Meaningful verbal contact was maintained at all times during the procedure  Local Anesthetic: Lidocaine 1-2%  Position: Prone   Indications: 1. Lumbar facet syndrome (Bilateral) (R>L)   2. Lumbar facet hypertrophy   3. Spondylosis without myelopathy or radiculopathy, lumbosacral region   4. DDD (degenerative disc disease), lumbar   5. Chronic low back pain (2ry area of Pain) (Bilateral) (R>L) w/o sciatica   6. Acute postoperative pain   7. Facet syndrome, lumbar   8. Facet hypertrophy of lumbar region    9. Chronic bilateral low back pain without sciatica    Jacqueline Thornton has been dealing with the above chronic pain for longer than three months and has either failed to respond, was unable to tolerate, or simply did not get enough benefit from other more conservative therapies including, but not limited to: 1. Over-the-counter medications 2. Anti-inflammatory medications 3. Muscle relaxants 4. Membrane stabilizers 5. Opioids 6. Physical therapy and/or chiropractic manipulation 7. Modalities (Heat, ice, etc.) 8. Invasive techniques such as nerve blocks. Jacqueline Thornton has attained more than 50% relief of the pain from a series of diagnostic injections conducted in separate occasions.  Pain Score: Pre-procedure: 4 /10 Post-procedure: 0-No pain/10  Post-Procedure Evaluation  Procedure (12/19/2020): Type: Thermal Lumbar Facet, Medial Branch Radiofrequency Ablation/Neurotomy  #2  Primary Purpose: Therapeutic Region: Posterolateral Lumbosacral Spine Level: L2, L3, L4, L5, & S1 Medial Branch Level(s). These levels will denervate the L3-4, L4-5, and the L5-S1 lumbar facet joints. Laterality: Left Pre-procedure pain level: 4/10 Post-procedure: 0/10 (100% relief)  Anxiolysis: Minimal conscious anxiolysis  Effectiveness during initial hour after procedure (Ultra-Short Term Relief): 100 %.  Local anesthetic used: Long-acting (4-6 hours) Effectiveness: Defined as any analgesic benefit obtained secondary to the administration of local anesthetics. This carries significant diagnostic value as to the etiological location, or anatomical origin, of the pain. Duration of benefit is expected to coincide with the duration of the local anesthetic used.  Effectiveness during initial 4-6 hours after procedure (Short-Term Relief): 100 %.  Long-term benefit: Defined as any relief past the pharmacologic duration of the local anesthetics.  Effectiveness past the initial 6 hours after procedure (Long-Term Relief): 75  % (has only been approx 3 weeks since procedure).  Benefits, current: Defined as  benefit present at the time of this evaluation.   Analgesia: Ongoing >75% relief Function: Jacqueline Thornton reports improvement in function ROM: Jacqueline Thornton reports improvement in ROM  Pre-op H&P Assessment:  Jacqueline Thornton is a 70 y.o. (year old), female patient, seen today for interventional treatment. She  has a past surgical history that includes Cholecystectomy; Abdominal hysterectomy; Neck surgery; Shoulder surgery; Colonoscopy with propofol (N/A, 07/19/2015); caract surger; Laparoscopic salpingo oophorectomy (Bilateral, 03/03/2018); Cystoscopy (03/03/2018); Tonsillectomy; Appendectomy; Tumor removal; Anterior lumbar fusion (N/A, 12/11/2018); Abdominal exposure (N/A, 12/11/2018); and Anterior cervical decomp/discectomy fusion (N/A, 10/06/2020). Jacqueline Thornton has a current medication list which includes the following prescription(s): calcium, vitamin b-12, duloxetine, enalapril, esomeprazole, estradiol, furosemide, gabapentin, hydrocodone-acetaminophen, [START ON 01/18/2021] hydrocodone-acetaminophen, melatonin, methocarbamol, multivitamin with minerals, oxycodone-acetaminophen, [START ON 01/12/2021] oxycodone-acetaminophen, [START ON 02/11/2021] oxycodone-acetaminophen, and sertraline, and the following Facility-Administered Medications: midazolam. Her primarily concern today is the Back Pain (Right lower)  Initial Vital Signs:  Pulse/HCG Rate: 96ECG Heart Rate: 78 Temp: (!) 97 F (36.1 C) Resp: 16 BP: (!) 125/96 SpO2: 99 %  BMI: Estimated body mass index is 29.1 kg/m as calculated from the following:   Height as of this encounter: 5' (1.524 m).   Weight as of this encounter: 149 lb (67.6 kg).  Risk Assessment: Allergies: Reviewed. She has No Known Allergies.  Allergy Precautions: None required Coagulopathies: Reviewed. None identified.  Blood-thinner therapy: None at this time Active Infection(s): Reviewed. None identified.  Jacqueline Thornton is afebrile  Site Confirmation: Jacqueline Thornton was asked to confirm the procedure and laterality before marking the site Procedure checklist: Completed Consent: Before the procedure and under the influence of no sedative(s), amnesic(s), or anxiolytics, the patient was informed of the treatment options, risks and possible complications. To fulfill our ethical and legal obligations, as recommended by the American Medical Association's Code of Ethics, I have informed the patient of my clinical impression; the nature and purpose of the treatment or procedure; the risks, benefits, and possible complications of the intervention; the alternatives, including doing nothing; the risk(s) and benefit(s) of the alternative treatment(s) or procedure(s); and the risk(s) and benefit(s) of doing nothing. The patient was provided information about the general risks and possible complications associated with the procedure. These may include, but are not limited to: failure to achieve desired goals, infection, bleeding, organ or nerve damage, allergic reactions, paralysis, and death. In addition, the patient was informed of those risks and complications associated to Spine-related procedures, such as failure to decrease pain; infection (i.e.: Meningitis, epidural or intraspinal abscess); bleeding (i.e.: epidural hematoma, subarachnoid hemorrhage, or any other type of intraspinal or peri-dural bleeding); organ or nerve damage (i.e.: Any type of peripheral nerve, nerve root, or spinal cord injury) with subsequent damage to sensory, motor, and/or autonomic systems, resulting in permanent pain, numbness, and/or weakness of one or several areas of the body; allergic reactions; (i.e.: anaphylactic reaction); and/or death. Furthermore, the patient was informed of those risks and complications associated with the medications. These include, but are not limited to: allergic reactions (i.e.: anaphylactic or anaphylactoid  reaction(s)); adrenal axis suppression; blood sugar elevation that in diabetics may result in ketoacidosis or comma; water retention that in patients with history of congestive heart failure may result in shortness of breath, pulmonary edema, and decompensation with resultant heart failure; weight gain; swelling or edema; medication-induced neural toxicity; particulate matter embolism and blood vessel occlusion with resultant organ, and/or nervous system infarction; and/or aseptic necrosis of one or more joints. Finally, the patient was informed that  Medicine is not an exact science; therefore, there is also the possibility of unforeseen or unpredictable risks and/or possible complications that may result in a catastrophic outcome. The patient indicated having understood very clearly. We have given the patient no guarantees and we have made no promises. Enough time was given to the patient to ask questions, all of which were answered to the patient's satisfaction. Jacqueline Thornton has indicated that she wanted to continue with the procedure. Attestation: I, the ordering provider, attest that I have discussed with the patient the benefits, risks, side-effects, alternatives, likelihood of achieving goals, and potential problems during recovery for the procedure that I have provided informed consent. Date  Time: 01/11/2021 12:36 PM  Pre-Procedure Preparation:  Monitoring: As per clinic protocol. Respiration, ETCO2, SpO2, BP, heart rate and rhythm monitor placed and checked for adequate function Safety Precautions: Patient was assessed for positional comfort and pressure points before starting the procedure. Time-out: I initiated and conducted the "Time-out" before starting the procedure, as per protocol. The patient was asked to participate by confirming the accuracy of the "Time Out" information. Verification of the correct person, site, and procedure were performed and confirmed by me, the nursing staff, and the  patient. "Time-out" conducted as per Joint Commission's Universal Protocol (UP.01.01.01). Time: 1320  Description of Procedure:          Laterality: Right Levels:  L2, L3, L4, L5, & S1 Medial Branch Level(s), at the L3-4, L4-5, and the L5-S1 lumbar facet joints. Area Prepped: Lumbosacral DuraPrep (Iodine Povacrylex [0.7% available iodine] and Isopropyl Alcohol, 74% w/w) Safety Precautions: Aspiration looking for blood return was conducted prior to all injections. At no point did we inject any substances, as a needle was being advanced. Before injecting, the patient was told to immediately notify me if she was experiencing any new onset of "ringing in the ears, or metallic taste in the mouth". No attempts were made at seeking any paresthesias. Safe injection practices and needle disposal techniques used. Medications properly checked for expiration dates. SDV (single dose vial) medications used. After the completion of the procedure, all disposable equipment used was discarded in the proper designated medical waste containers. Local Anesthesia: Protocol guidelines were followed. The patient was positioned over the fluoroscopy table. The area was prepped in the usual manner. The time-out was completed. The target area was identified using fluoroscopy. A 12-in long, straight, sterile hemostat was used with fluoroscopic guidance to locate the targets for each level blocked. Once located, the skin was marked with an approved surgical skin marker. Once all sites were marked, the skin (epidermis, dermis, and hypodermis), as well as deeper tissues (fat, connective tissue and muscle) were infiltrated with a small amount of a short-acting local anesthetic, loaded on a 10cc syringe with a 25G, 1.5-in  Needle. An appropriate amount of time was allowed for local anesthetics to take effect before proceeding to the next step. Local Anesthetic: Lidocaine 2.0% The unused portion of the local anesthetic was discarded in the  proper designated containers. Technical explanation of process:  Radiofrequency Ablation (RFA) L2 Medial Branch Nerve RFA: The target area for the L2 medial branch is at the junction of the postero-lateral aspect of the superior articular process and the superior, posterior, and medial edge of the transverse process of L3. Under fluoroscopic guidance, a Radiofrequency needle was inserted until contact was made with os over the superior postero-lateral aspect of the pedicular shadow (target area). Sensory and motor testing was conducted to properly adjust the position  of the needle. Once satisfactory placement of the needle was achieved, the numbing solution was slowly injected after negative aspiration for blood. 2.0 mL of the nerve block solution was injected without difficulty or complication. After waiting for at least 3 minutes, the ablation was performed. Once completed, the needle was removed intact. L3 Medial Branch Nerve RFA: The target area for the L3 medial branch is at the junction of the postero-lateral aspect of the superior articular process and the superior, posterior, and medial edge of the transverse process of L4. Under fluoroscopic guidance, a Radiofrequency needle was inserted until contact was made with os over the superior postero-lateral aspect of the pedicular shadow (target area). Sensory and motor testing was conducted to properly adjust the position of the needle. Once satisfactory placement of the needle was achieved, the numbing solution was slowly injected after negative aspiration for blood. 2.0 mL of the nerve block solution was injected without difficulty or complication. After waiting for at least 3 minutes, the ablation was performed. Once completed, the needle was removed intact. L4 Medial Branch Nerve RFA: The target area for the L4 medial branch is at the junction of the postero-lateral aspect of the superior articular process and the superior, posterior, and medial edge of  the transverse process of L5. Under fluoroscopic guidance, a Radiofrequency needle was inserted until contact was made with os over the superior postero-lateral aspect of the pedicular shadow (target area). Sensory and motor testing was conducted to properly adjust the position of the needle. Once satisfactory placement of the needle was achieved, the numbing solution was slowly injected after negative aspiration for blood. 2.0 mL of the nerve block solution was injected without difficulty or complication. After waiting for at least 3 minutes, the ablation was performed. Once completed, the needle was removed intact. L5 Medial Branch Nerve RFA: The target area for the L5 medial branch is at the junction of the postero-lateral aspect of the superior articular process of S1 and the superior, posterior, and medial edge of the sacral ala. Under fluoroscopic guidance, a Radiofrequency needle was inserted until contact was made with os over the superior postero-lateral aspect of the pedicular shadow (target area). Sensory and motor testing was conducted to properly adjust the position of the needle. Once satisfactory placement of the needle was achieved, the numbing solution was slowly injected after negative aspiration for blood. 2.0 mL of the nerve block solution was injected without difficulty or complication. After waiting for at least 3 minutes, the ablation was performed. Once completed, the needle was removed intact. S1 Medial Branch Nerve RFA: The target area for the S1 medial branch is located inferior to the junction of the S1 superior articular process and the L5 inferior articular process, posterior, inferior, and lateral to the 6 o'clock position of the L5-S1 facet joint, just superior to the S1 posterior foramen. Under fluoroscopic guidance, the Radiofrequency needle was advanced until contact was made with os over the Target area. Sensory and motor testing was conducted to properly adjust the position of  the needle. Once satisfactory placement of the needle was achieved, the numbing solution was slowly injected after negative aspiration for blood. 2.0 mL of the nerve block solution was injected without difficulty or complication. After waiting for at least 3 minutes, the ablation was performed. Once completed, the needle was removed intact. Radiofrequency lesioning (ablation):  Radiofrequency Generator: NeuroTherm [id number] Sensory Stimulation Parameters: 50 Hz was used to locate & identify the nerve, making sure that the needle was  positioned such that there was no sensory stimulation below 0.3 V or above 0.7 V. Motor Stimulation Parameters: 2 Hz was used to evaluate the motor component. Care was taken not to lesion any nerves that demonstrated motor stimulation of the lower extremities at an output of less than 2.5 times that of the sensory threshold, or a maximum of 2.0 V. Lesioning Technique Parameters: Standard Radiofrequency settings. (Not bipolar or pulsed.) Temperature Settings: 80 degrees C Lesioning time: 60 seconds Intra-operative Compliance: Compliant Materials & Medications: Needle(s) (Electrode/Cannula) Type: Teflon-coated, curved tip, Radiofrequency needle(s) Gauge: 22G Length: 10cm Numbing solution: 0.2% PF-Ropivacaine + Triamcinolone (40 mg/mL) diluted to a final concentration of 4 mg of Triamcinolone/mL of Ropivacaine The unused portion of the solution was discarded in the proper designated containers.  Once the entire procedure was completed, the treated area was cleaned, making sure to leave some of the prepping solution back to take advantage of its long term bactericidal properties.    Illustration of the posterior view of the lumbar spine and the posterior neural structures. Laminae of L2 through S1 are labeled. DPRL5, dorsal primary ramus of L5; DPRS1, dorsal primary ramus of S1; DPR3, dorsal primary ramus of L3; FJ, facet (zygapophyseal) joint L3-L4; I, inferior articular  process of L4; LB1, lateral branch of dorsal primary ramus of L1; IAB, inferior articular branches from L3 medial branch (supplies L4-L5 facet joint); IBP, intermediate branch plexus; MB3, medial branch of dorsal primary ramus of L3; NR3, third lumbar nerve root; S, superior articular process of L5; SAB, superior articular branches from L4 (supplies L4-5 facet joint also); TP3, transverse process of L3.  Vitals:   01/11/21 1350 01/11/21 1400 01/11/21 1410 01/11/21 1420  BP: (!) 146/82 102/69 135/75 131/71  Pulse: 80     Resp: 13 17 15 15   Temp:  (!) 97.3 F (36.3 C)  (!) 97.1 F (36.2 C)  SpO2: 100% 100% 100% 100%  Weight:      Height:       Start Time: 1320 hrs. End Time: 1347 hrs.  Imaging Guidance (Spinal):          Type of Imaging Technique: Fluoroscopy Guidance (Spinal) Indication(s): Assistance in needle guidance and placement for procedures requiring needle placement in or near specific anatomical locations not easily accessible without such assistance. Exposure Time: Please see nurses notes. Contrast: None used. Fluoroscopic Guidance: I was personally present during the use of fluoroscopy. "Tunnel Vision Technique" used to obtain the best possible view of the target area. Parallax error corrected before commencing the procedure. "Direction-depth-direction" technique used to introduce the needle under continuous pulsed fluoroscopy. Once target was reached, antero-posterior, oblique, and lateral fluoroscopic projection used confirm needle placement in all planes. Images permanently stored in EMR. Interpretation: No contrast injected. I personally interpreted the imaging intraoperatively. Adequate needle placement confirmed in multiple planes. Permanent images saved into the patient's record.  Antibiotic Prophylaxis:   Anti-infectives (From admission, onward)    None      Indication(s): None identified  Post-operative Assessment:  Post-procedure Vital Signs:  Pulse/HCG Rate:  80 (nsr)80 Temp: (!) 97.1 F (36.2 C) Resp: 15 BP: 131/71 SpO2: 100 %  EBL: None  Complications: No immediate post-treatment complications observed by team, or reported by patient.  Note: The patient tolerated the entire procedure well. A repeat set of vitals were taken after the procedure and the patient was kept under observation following institutional policy, for this type of procedure. Post-procedural neurological assessment was performed, showing return to baseline, prior  to discharge. The patient was provided with post-procedure discharge instructions, including a section on how to identify potential problems. Should any problems arise concerning this procedure, the patient was given instructions to immediately contact us, at any time, without hesitation. In any case, we plan to contact the patient by telephone for a follow-up status report regarding this interventional procedure.  Comments:  No additional relevant information.  Plan of Care  Orders:  Orders Placed This Encounter  Procedures   Radiofrequency,Lumbar    Scheduling Instructions:     Side(s): Right-sided     Level: L3-4, L4-5, & L5-S1 Facets (L2, L3, L4, L5, & S1 Medial Branch Nerves)     Sedation: With Sedation.     Timeframe: Today    Order Specific Question:   Where will this procedure be performed?    Answer:   [facility]   DG PAIN [facility] C-ARM 1-60 MIN NO REPORT    Intraoperative interpretation by procedural physician at [place].    Standing Status:   Standing    Number of Occurrences:   1    Order Specific Question:   Reason for exam:    Answer:   Assistance in needle guidance and placement for procedures requiring needle placement in or near specific anatomical locations not easily accessible without such assistance.   Informed Consent Details: Physician/Practitioner Attestation; Transcribe to consent form and obtain patient signature    Nursing Order: Transcribe to consent form  and obtain patient signature. Note: Always confirm laterality of pain with Jacqueline Thornton, before procedure.    Order Specific Question:   Physician/Practitioner attestation of informed consent for procedure/surgical case    Answer:   I, the physician/practitioner, attest that I have discussed with the patient the benefits, risks, side effects, alternatives, likelihood of achieving goals and potential problems during recovery for the procedure that I have provided informed consent.    Order Specific Question:   Procedure    Answer:   Lumbar Facet Radiofrequency Ablation    Order Specific Question:   Physician/Practitioner performing the procedure    Answer:   Fabian Walder A. Dossie Arbour, MD    Order Specific Question:   Indication/Reason    Answer:   Low Back Pain, with our without leg pain, due to Facet Joint Arthralgia (Joint Pain) known as Lumbar Facet Syndrome, secondary to Lumbar, and/or Lumbosacral Spondylosis (Arthritis of the Spine), without myelopathy or radiculopathy (Nerve Damage).   Provide equipment / supplies at bedside    "Radiofrequency Tray"; Large hemostat (x1); Small hemostat (x1); Towels (x8); 4x4 sterile sponge pack (x1) Needle type: Teflon-coated Radiofrequency Needle (Disposable  single use) Size: Regular Quantity: 5    Standing Status:   Standing    Number of Occurrences:   1    Order Specific Question:   Specify    Answer:   Radiofrequency Tray   Chronic Opioid Analgesic:  Oxycodone/APAP 7.5/325, 1 tab PO q 5 times a day. (56.25 mg/day of oxycodone) MME/day: 56.25 mg/day.   Medications ordered for procedure: Meds ordered this encounter  Medications   lidocaine (XYLOCAINE) 2 % (with pres) injection 400 mg   lactated ringers infusion 1,000 mL   midazolam (VERSED) 5 MG/5ML injection 1-2 mg    Make sure Flumazenil is available in the pyxis when using this medication. If oversedation occurs, administer 0.2 mg IV over 15 sec. If after 45 sec no response, administer 0.2 mg again  over 1 min; may repeat at 1 min intervals; not to exceed  4 doses (1 mg)   ropivacaine (PF) 2 mg/mL (0.2%) (NAROPIN) injection 9 mL   triamcinolone acetonide (KENALOG-40) injection 40 mg   HYDROcodone-acetaminophen (NORCO/VICODIN) 5-325 MG tablet    Sig: Take 1 tablet by mouth every 8 (eight) hours as needed for up to 7 days for severe pain. Must last 7 days.    Dispense:  21 tablet    Refill:  0    For acute post-operative pain. Not to be refilled. Must last 7 days.   HYDROcodone-acetaminophen (NORCO/VICODIN) 5-325 MG tablet    Sig: Take 1 tablet by mouth every 8 (eight) hours as needed for up to 7 days for severe pain. Must last for 7 days.    Dispense:  21 tablet    Refill:  0    For acute post-operative pain. Not to be refilled. Must last 7 days.   Medications administered: We administered lidocaine, lactated ringers, midazolam, ropivacaine (PF) 2 mg/mL (0.2%), and triamcinolone acetonide.  See the medical record for exact dosing, route, and time of administration.  Follow-up plan:   Return in about 6 weeks (around 02/22/2021) for procedure day (afternoon VV) (PPE).      Interventional Therapies  Risk  Complexity Considerations:   WNL   Planned  Pending:   (L) L-FCT RFA   Under consideration:   Diagnostic caudal diagnostic epidurogram Diagnostic CESI  Diagnostic bilateral cervical facet block  Possible bilateral cervical facet RFA  Diagnostic left L4-5 LESI  Diagnostic left S1 SNRB    Completed:   Therapeutic right Racz procedure x1 (08/17/2020) (100% relief of the right lower extremity pain) Diagnostic caudal ESI x1 (07/11/2020) Palliative left L5-S1 LESI x2 (07/27/2015)  Diagnostic left lumbar facet MBB x6 (05/14/2018)  Palliative right lumbar facet MBB x9 (06/13/2020)  Therapeutic right shoulder joint injection x1 (02/29/2016)  Therapeutic left IA hip injection x3 (06/13/2020)  Diagnostic right SI joint block x3 (06/17/2017)  Diagnostic/therapeutic left SI joint block  x4 (06/17/2017)  Palliative bilateral trochanteric bursa injection x1 (12/11/2016)  Therapeutic right lumbar facet MB RFA x1 (08/07/2017)  Therapeutic left lumbar facet MB RFA x1 (09/16/2017)  Therapeutic right SI joint RFA x1 (08/07/2017)  Therapeutic left SI joint RFA x1 (09/16/2017)    Therapeutic  Palliative (PRN) options:   Palliative Racz procedure  Palliative bilateral lumbar facet block  Palliative bilateral SI joint block  Palliative right trochanteric bursa injection         Recent Visits Date Type Provider Dept  12/19/20 Procedure visit Milinda Pointer, MD [facility]  12/13/20 Office Visit Milinda Pointer, MD [facility]  11/28/20 Procedure visit Milinda Pointer, MD [facility]  Showing recent visits within past 90 days and meeting all other requirements Today's Visits Date Type Provider Dept  01/11/21 Procedure visit Milinda Pointer, MD [facility]  Showing today's visits and meeting all other requirements Future Appointments Date Type Provider Dept  02/21/21 Appointment Milinda Pointer, MD [facility]  Showing future appointments within next 90 days and meeting all other requirements Disposition: Discharge home  Discharge (Date  Time): 01/11/2021; 1429 hrs.   Primary Care Physician: Perrin Maltese, MD Location: [facility] Note by: Gaspar Cola, MD Date: 01/11/2021; Time: 7:27 PM  Disclaimer:  Medicine is not an Chief Strategy Officer. The only guarantee in medicine is that nothing is guaranteed. It is important to note that the decision to proceed with this intervention was based on the information collected from the patient. The Data and  conclusions were drawn from the patient's questionnaire, the interview, and the physical examination. Because the information was provided in large part by the patient, it cannot be guaranteed that it has not been purposely or unconsciously  manipulated. Every effort has been made to obtain as much relevant data as possible for this evaluation. It is important to note that the conclusions that lead to this procedure are derived in large part from the available data. Always take into account that the treatment will also be dependent on availability of resources and existing treatment guidelines, considered by other Pain Management Practitioners as being common knowledge and practice, at the time of the intervention. For Medico-Legal purposes, it is also important to point out that variation in procedural techniques and pharmacological choices are the acceptable norm. The indications, contraindications, technique, and results of the above procedure should only be interpreted and judged by a Board-Certified Interventional Pain Specialist with extensive familiarity and expertise in the same exact procedure and technique.

## 2021-01-11 ENCOUNTER — Ambulatory Visit (HOSPITAL_BASED_OUTPATIENT_CLINIC_OR_DEPARTMENT_OTHER): Payer: Medicare Other | Admitting: Pain Medicine

## 2021-01-11 ENCOUNTER — Encounter: Payer: Self-pay | Admitting: Pain Medicine

## 2021-01-11 ENCOUNTER — Other Ambulatory Visit: Payer: Self-pay

## 2021-01-11 ENCOUNTER — Ambulatory Visit
Admission: RE | Admit: 2021-01-11 | Discharge: 2021-01-11 | Disposition: A | Discharge status: Home / Self Care | Payer: Medicare Other | Source: Ambulatory Visit | Attending: Pain Medicine | Admitting: Pain Medicine

## 2021-01-11 VITALS — BP 131/71 | HR 80 | Temp 97.1°F | Resp 15 | Ht 60.0 in | Wt 149.0 lb

## 2021-01-11 DIAGNOSIS — M545 Low back pain, unspecified: Secondary | ICD-10-CM | POA: Diagnosis present

## 2021-01-11 DIAGNOSIS — M5136 Other intervertebral disc degeneration, lumbar region: Secondary | ICD-10-CM | POA: Diagnosis present

## 2021-01-11 DIAGNOSIS — G8918 Other acute postprocedural pain: Secondary | ICD-10-CM | POA: Diagnosis present

## 2021-01-11 DIAGNOSIS — M47816 Spondylosis without myelopathy or radiculopathy, lumbar region: Secondary | ICD-10-CM | POA: Diagnosis present

## 2021-01-11 DIAGNOSIS — G8929 Other chronic pain: Secondary | ICD-10-CM | POA: Insufficient documentation

## 2021-01-11 DIAGNOSIS — M47817 Spondylosis without myelopathy or radiculopathy, lumbosacral region: Secondary | ICD-10-CM | POA: Insufficient documentation

## 2021-01-11 MED ORDER — LIDOCAINE HCL 2 % IJ SOLN
20.0000 mL | Freq: Once | INTRAMUSCULAR | Status: AC
  Administered 2021-01-11: 400 mg

## 2021-01-11 MED ORDER — TRIAMCINOLONE ACETONIDE 40 MG/ML IJ SUSP
40.0000 mg | Freq: Once | INTRAMUSCULAR | Status: AC
  Administered 2021-01-11: 40 mg
  Filled 2021-01-11: qty 1

## 2021-01-11 MED ORDER — HYDROCODONE-ACETAMINOPHEN 5-325 MG PO TABS: 1.0000 | ORAL_TABLET | Freq: Three times a day (TID) | ORAL | 0 refills | Status: AC | PRN

## 2021-01-11 MED ORDER — LACTATED RINGERS IV SOLN
1000.0000 mL | Freq: Once | INTRAVENOUS | Status: AC
  Administered 2021-01-11: 1000 mL via INTRAVENOUS

## 2021-01-11 MED ORDER — LIDOCAINE HCL (PF) 2 % IJ SOLN
INTRAMUSCULAR | Status: AC
  Filled 2021-01-11: qty 20

## 2021-01-11 MED ORDER — ROPIVACAINE HCL 2 MG/ML IJ SOLN
9.0000 mL | Freq: Once | INTRAMUSCULAR | Status: AC
Start: 2021-01-11 — End: 2021-01-11
  Administered 2021-01-11: 9 mL via PERINEURAL

## 2021-01-11 MED ORDER — ROPIVACAINE HCL 2 MG/ML IJ SOLN
INTRAMUSCULAR | Status: AC
  Filled 2021-01-11: qty 20

## 2021-01-11 MED ORDER — MIDAZOLAM HCL 5 MG/5ML IJ SOLN
1.0000 mg | INTRAMUSCULAR | Status: AC | PRN
  Administered 2021-01-11: 2 mg via INTRAVENOUS
  Administered 2021-01-11 (×2): 1 mg via INTRAVENOUS
  Filled 2021-01-11: qty 5

## 2021-01-11 NOTE — Progress Notes (Signed)
Safety precautions to be maintained throughout the outpatient stay will include: orient to surroundings, keep bed in low position, maintain call bell within reach at all times, provide assistance with transfer out of bed and ambulation.  

## 2021-01-11 NOTE — Patient Instructions (Signed)

## 2021-01-12 ENCOUNTER — Telehealth: Payer: Self-pay

## 2021-01-12 NOTE — Telephone Encounter (Signed)
Post procedure phone call.  Line busy x 3.

## 2021-02-20 ENCOUNTER — Telehealth: Payer: Medicare Other | Admitting: Pain Medicine

## 2021-02-20 NOTE — Progress Notes (Signed)
Unsuccessful attempt to contact patient for Virtual Visit (Pain Management Telehealth)   Patient provided contact information:  There is no home phone number on file.; [phone] (mobile); (Preferred) [phone] [email or web address]   Pre-screening:  Our staff was successful in contacting Jacqueline Thornton using the above provided information.   I unsuccessfully attempted to make contact with Jacqueline Thornton on 02/21/2021 via telephone. I was unable to complete the virtual encounter due to no one answering the phone. I was unable to leave a message.  Pharmacotherapy Assessment  Analgesic: Oxycodone/APAP 7.5/325, 1 tab PO q 5 times a day. (56.25 mg/day of oxycodone) MME/day: 56.25 mg/day.   Follow-up plan:   Reschedule Visit.    Interventional Therapies  Risk  Complexity Considerations:   WNL   Planned  Pending:   (L) L-FCT RFA   Under consideration:   Diagnostic caudal diagnostic epidurogram Diagnostic CESI  Diagnostic bilateral cervical facet block  Possible bilateral cervical facet RFA  Diagnostic left L4-5 LESI  Diagnostic left S1 SNRB    Completed:   Therapeutic right Racz procedure x1 (08/17/2020) (100% relief of the right lower extremity pain) Diagnostic caudal ESI x1 (07/11/2020) Palliative left L5-S1 LESI x2 (07/27/2015)  Diagnostic left lumbar facet MBB x6 (05/14/2018)  Palliative right lumbar facet MBB x9 (06/13/2020)  Therapeutic right shoulder joint injection x1 (02/29/2016)  Therapeutic left IA hip injection x3 (06/13/2020)  Diagnostic right SI joint block x3 (06/17/2017)  Diagnostic/therapeutic left SI joint block x4 (06/17/2017)  Palliative bilateral trochanteric bursa injection x1 (12/11/2016)  Therapeutic right lumbar facet MB RFA x1 (08/07/2017)  Therapeutic left lumbar facet MB RFA x1 (09/16/2017)  Therapeutic right SI joint RFA x1 (08/07/2017)  Therapeutic left SI joint RFA x1 (09/16/2017)    Therapeutic  Palliative (PRN) options:   Palliative Racz  procedure  Palliative bilateral lumbar facet block  Palliative bilateral SI joint block  Palliative right trochanteric bursa injection          Recent Visits Date Type Provider Dept  01/11/21 Procedure visit Milinda Pointer, MD [facility]  12/19/20 Procedure visit Milinda Pointer, MD [facility]  12/13/20 Office Visit Milinda Pointer, MD [facility]  Showing recent visits within past 90 days and meeting all other requirements Future Appointments Date Type Provider Dept  04/18/21 Appointment Milinda Pointer, MD [facility]  Showing future appointments within next 90 days and meeting all other requirements  Note by: Gaspar Cola, MD Date: 02/21/2021; Time: 11:27 AM

## 2021-02-21 ENCOUNTER — Telehealth (HOSPITAL_BASED_OUTPATIENT_CLINIC_OR_DEPARTMENT_OTHER): Payer: Medicare Other | Admitting: Pain Medicine

## 2021-02-21 DIAGNOSIS — G894 Chronic pain syndrome: Secondary | ICD-10-CM

## 2021-02-21 DIAGNOSIS — Z79899 Other long term (current) drug therapy: Secondary | ICD-10-CM

## 2021-02-21 DIAGNOSIS — M5020 Other cervical disc displacement, unspecified cervical region: Secondary | ICD-10-CM | POA: Insufficient documentation

## 2021-02-21 DIAGNOSIS — U071 COVID-19: Secondary | ICD-10-CM | POA: Insufficient documentation

## 2021-02-21 DIAGNOSIS — Z79891 Long term (current) use of opiate analgesic: Secondary | ICD-10-CM

## 2021-02-21 MED ORDER — OXYCODONE-ACETAMINOPHEN 7.5-325 MG PO TABS: 1.0000 | ORAL_TABLET | Freq: Every day | ORAL | 0 refills | Status: DC

## 2021-02-22 ENCOUNTER — Telehealth: Payer: Self-pay

## 2021-02-26 ENCOUNTER — Telehealth: Payer: Self-pay | Admitting: Pain Medicine

## 2021-02-26 NOTE — Telephone Encounter (Signed)
Returned the patient call re; MRI.  She states that she is just getting over Covid and from all the coughing she is having severe back pain.  She has also placed a phone call to her PCP Dr Deirdre Peer.  I told her that my first thought would be an appt with [facility] as I do not see any indication of ordering an MRI.  She also needs to have an appt prior to 04/12/21 for medication management.  Patient reports that she has had to take extra pain pills over the past 4 days along with ibuprofen and is getting no relief.  Patient reminded that she needs to be sure her pain medication last until her next appt, which she does not have scheduled yet.  I told her I would have scheduling call her to get this set up and meanwhile see if Dr Julaine Fusi office would handle this acute onset of back pain.

## 2021-03-08 ENCOUNTER — Other Ambulatory Visit: Payer: Self-pay | Admitting: Internal Medicine

## 2021-03-08 DIAGNOSIS — Z1231 Encounter for screening mammogram for malignant neoplasm of breast: Secondary | ICD-10-CM

## 2021-03-12 ENCOUNTER — Encounter: Payer: Medicare Other | Admitting: Pain Medicine

## 2021-03-28 ENCOUNTER — Other Ambulatory Visit: Payer: Self-pay

## 2021-03-28 ENCOUNTER — Ambulatory Visit
Admission: RE | Admit: 2021-03-28 | Discharge: 2021-03-28 | Disposition: A | Discharge status: Home / Self Care | Payer: Medicare Other | Source: Ambulatory Visit | Attending: Internal Medicine | Admitting: Internal Medicine

## 2021-03-28 DIAGNOSIS — Z1231 Encounter for screening mammogram for malignant neoplasm of breast: Secondary | ICD-10-CM | POA: Insufficient documentation

## 2021-04-15 NOTE — Progress Notes (Signed)
PROVIDER NOTE: Information contained herein reflects review and annotations entered in association with encounter. Interpretation of such information and data should be left to medically-trained personnel. Information provided to patient can be located elsewhere in the medical record under "Patient Instructions". Document created using STT-dictation technology, any transcriptional errors that may result from process are unintentional.    Patient: Jacqueline Thornton  Service Category: E/M  Provider: Gaspar Cola, MD  DOB: 10-09-1950  DOS: 04/16/2021  Specialty: Interventional Pain Management  MRN: [id number]  Setting: Ambulatory outpatient  PCP: Perrin Maltese, MD  Type: Established Patient    Referring Provider: Perrin Maltese, MD  Location: Office  Delivery: Face-to-face     HPI  Jacqueline Thornton, a 70 y.o. year old female, is here today because of her No primary diagnosis found.. Jacqueline Thornton's primary complain today is Back Pain (low) Last encounter: My last encounter with her was on 02/26/2021. Pertinent problems: Jacqueline Thornton has Chronic pain syndrome; Headache, migraine; Chronic low back pain (2ry area of Pain) (Bilateral) (R>L) w/o sciatica; Spondylosis of lumbar spine; Lumbar annular disc tear (L4-5); Discogenic low back pain (L3-4 and L4-5); Lumbar facet hypertrophy; Lumbar facet syndrome (Bilateral) (R>L); Chronic neck pain (Left); Cervical spondylosis; Hx of cervical spine surgery; Cervical spinal fusion (C6-7 interbody fusion); Chronic sacroiliac joint pain (Bilateral) (L>R); Chronic lumbar radicular pain (S1 Dermatome) (1ry area of Pain) (Bilateral); Trochanteric bursitis of hip (Right); Chronic hip pain (Bilateral) (L>R); Neurogenic pain; Myofascial pain; Osteoarthritis of hip (Left); Arthrodesis status; DDD (degenerative disc disease), lumbar; Myalgia; Trochanteric bursitis of hip (Bilateral) (L>R); Spondylosis without myelopathy or radiculopathy, lumbosacral region; Spondylosis without  myelopathy or radiculopathy, sacral and sacrococcygeal region; Other specified dorsopathies, sacral and sacrococcygeal region; Age-related osteoporosis without current pathological fracture; Bone island of right femur; Inflammatory spondylopathy of lumbar region [facility]); Trigger point with back pain (Right); Fibromyalgia; DDD (degenerative disc disease), cervical; Spondylolisthesis of lumbar region; History of lumbar spinal fusion (Dr. Erline Levine) (12/11/2018); Failed back surgical syndrome; Unilateral occipital headache (Left); Cervicogenic headache (Left); Chronic hip pain (Left); Spondylolisthesis at L5-S1 level; Spondylosis of lumbosacral region without myelopathy or radiculopathy; Epidural fibrosis; Enthesopathy of hip region (Left); Chronic sacroiliac joint pain (Left); Enthesopathy of sacroiliac joint (Left); Somatic dysfunction of sacroiliac joint (Left); Herniated cervical disc without myelopathy; Chronic sacroiliac joint pain (Right); Osteoarthritis of sacroiliac joint (Right) ([place]); Somatic dysfunction of sacroiliac joint (Right); Chronic hip pain (Right); Neck pain; Herniated nucleus pulposus, C3-4; Prolapsed cervical intervertebral disc; Cervical spinal stenosis; and Radiculopathy, cervical region on their pertinent problem list. Pain Assessment: Severity of Chronic pain is reported as a 6 /10. Location: Back Lower/bilateral buttocks. Onset: More than a month ago. Quality: Constant, Stabbing, Aching. Timing: Constant. Modifying factor(s): medications, ice, heat, rest. Vitals:  height is 5' (1.524 m) and weight is 145 lb (65.8 kg). Her temporal temperature is 97.1 F (36.2 C) (abnormal). Her blood pressure is 126/65 and her pulse is 84. Her respiration is 16 and oxygen saturation is 96%.   Reason for encounter: medication management.   The patient indicates doing well with the current medication regimen. No adverse reactions or side effects reported to the medications.   RTCB: 07/15/2021 Nonopioids  transferred 05/24/2020: Vitamin B12, Flexeril, and Neurontin.  Pharmacotherapy Assessment  Analgesic: Oxycodone/APAP 7.5/325, 1 tab PO q 5 times a day. (56.25 mg/day of oxycodone) MME/day: 56.25 mg/day.   Monitoring: [place] PMP: PDMP reviewed during this encounter.       Pharmacotherapy: No side-effects or adverse reactions reported.  Compliance: No problems identified. Effectiveness: Clinically acceptable.  Hart Rochester, RN  04/16/2021  1:27 PM  Signed Nursing Pain Medication Assessment:  Safety precautions to be maintained throughout the outpatient stay will include: orient to surroundings, keep bed in low position, maintain call bell within reach at all times, provide assistance with transfer out of bed and ambulation.  Medication Inspection Compliance: Pill count conducted under aseptic conditions, in front of the patient. Neither the pills nor the bottle was removed from the patient's sight at any time. Once count was completed pills were immediately returned to the patient in their original bottle.  Medication: Oxycodone IR Pill/Patch Count: No pills available to be counted. Pill/Patch Appearance:  bottle empty Bottle Appearance: Standard pharmacy container. Clearly labeled. Filled Date: 09 / 15 / 2022 Last Medication intake:  Today    UDS:  Summary  Date Value Ref Range Status  12/13/2020 Note  Final    Comment:    ==================================================================== [employer] 13 (MW) ==================================================================== Test                             Result       Flag       Units  Drug Present and Declared for Prescription Verification   Oxycodone                      3279         EXPECTED   ng/mg creat   Oxymorphone                    691          EXPECTED   ng/mg creat   Noroxycodone                   6071         EXPECTED   ng/mg creat   Noroxymorphone                 305          EXPECTED   ng/mg creat     Sources of oxycodone are scheduled prescription medications.    Oxymorphone, noroxycodone, and noroxymorphone are expected    metabolites of oxycodone. Oxymorphone is also available as a    scheduled prescription medication.  ==================================================================== Test                      Result    Flag   Units      Ref Range   Creatinine              128              mg/dL      >=20 ==================================================================== Declared Medications:  The flagging and interpretation on this report are based on the  following declared medications.  Unexpected results may arise from  inaccuracies in the declared medications.   **Note: The testing scope of this panel includes these medications:   Oxycodone (Percocet)   **Note: The testing scope of this panel does not include the  following reported medications:   Acetaminophen (Percocet)  Bempedoic Acid (Nexletol)  Calcium  Duloxetine (Cymbalta)  Enalapril (Vasotec)  Esomeprazole (Nexium)  Estradiol (Estrace)  Furosemide (Lasix)  Gabapentin (Neurontin)  Melatonin  Methocarbamol (Robaxin)  Multivitamin  Sertraline (Zoloft)  Vitamin B12 ==================================================================== For clinical consultation, please call [phone]. ====================================================================  ROS  Constitutional: Denies any fever or chills Gastrointestinal: No reported hemesis, hematochezia, vomiting, or acute GI distress Musculoskeletal: Denies any acute onset joint swelling, redness, loss of ROM, or weakness Neurological: No reported episodes of acute onset apraxia, aphasia, dysarthria, agnosia, amnesia, paralysis, loss of coordination, or loss of consciousness  Medication Review  Calcium, DULoxetine, Melatonin, Vitamin B-12, enalapril, esomeprazole, estradiol, furosemide, gabapentin, methocarbamol, multivitamin with minerals, and  oxyCODONE-acetaminophen  History Review  Allergy: Jacqueline Thornton has No Known Allergies. Drug: Jacqueline Thornton  reports no history of drug use. Alcohol:  reports no history of alcohol use. Tobacco:  reports that she quit smoking about 17 years ago. Her smoking use included cigarettes. She has never used smokeless tobacco. Social: Jacqueline Thornton  reports that she quit smoking about 17 years ago. Her smoking use included cigarettes. She has never used smokeless tobacco. She reports that she does not drink alcohol and does not use drugs. Medical:  has a past medical history of Absolute anemia (04/11/2015), Acute postoperative pain (08/07/2017), Angina pectoris ([place]), Anxiety, Arthritis, Arthropathy of sacroiliac joint (04/11/2015), Atypical face pain (04/24/2015), Back pain, CAD (coronary artery disease), Cancer ([place]), Chronic back pain, Constipation, Depression, Fibromyalgia, GERD (gastroesophageal reflux disease), 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) (04/11/2015), H/O: hysterectomy (1979), Heart murmur, Hyperlipidemia, Hypertension, Low back pain (04/06/2015), Lumbar radicular pain (04/18/2015), Migraine, Narrowing of intervertebral disc space (04/11/2015), Sacroiliac joint pain (04/11/2015), and Spine disorder. Surgical: Ms. Boschert  has a past surgical history that includes Cholecystectomy; Abdominal hysterectomy; Neck surgery; Shoulder surgery; Colonoscopy with propofol (N/A, 07/19/2015); caract surger; Laparoscopic salpingo oophorectomy (Bilateral, 03/03/2018); Cystoscopy (03/03/2018); Tonsillectomy; Appendectomy; Tumor removal; Anterior lumbar fusion (N/A, 12/11/2018); Abdominal exposure (N/A, 12/11/2018); and Anterior cervical decomp/discectomy fusion (N/A, 10/06/2020). Family: family history includes Colon cancer in her maternal grandmother and mother; Diabetes in her father and mother; Hypertension in her sister; Stroke in her mother.  Laboratory Chemistry Profile   Renal Lab Results  Component Value Date   BUN  8 10/06/2020   CREATININE 0.92 10/06/2020   BCR 8 (L) 12/16/2019   GFRAA 47 (L) 12/16/2019   GFRNONAA >60 10/06/2020    Hepatic Lab Results  Component Value Date   AST 15 12/16/2019   ALT 14 03/08/2018   ALBUMIN 4.0 12/16/2019   ALKPHOS 60 12/16/2019    Electrolytes Lab Results  Component Value Date   NA 137 10/06/2020   K 3.6 10/06/2020   CL 105 10/06/2020   CALCIUM 8.6 (L) 10/06/2020   MG 2.4 (H) 12/16/2019    Bone Lab Results  Component Value Date   25OHVITD1 53 12/16/2019   25OHVITD2 <1.0 12/16/2019   25OHVITD3 53 12/16/2019    Inflammation (CRP: Acute Phase) (ESR: Chronic Phase) Lab Results  Component Value Date   CRP 4 12/16/2019   ESRSEDRATE 6 12/16/2019         Note: Above Lab results reviewed.  Recent Imaging Review  MM 3D SCREEN BREAST BILATERAL CLINICAL DATA:  Screening.  EXAM: DIGITAL SCREENING BILATERAL MAMMOGRAM WITH TOMOSYNTHESIS AND CAD  TECHNIQUE: Bilateral screening digital craniocaudal and mediolateral oblique mammograms were obtained. Bilateral screening digital breast tomosynthesis was performed. The images were evaluated with computer-aided detection.  COMPARISON:  Previous exam(s).  ACR Breast Density Category c: The breast tissue is heterogeneously dense, which may obscure small masses.  FINDINGS: There are no findings suspicious for malignancy.  IMPRESSION: No mammographic evidence of malignancy. A result letter of this screening mammogram will be mailed directly to the patient.  RECOMMENDATION: Screening mammogram in one  year. (Code:SM-B-01Y)  BI-RADS CATEGORY  1: Negative.  Electronically Signed   By: Lillia Mountain M.D.   On: 04/03/2021 14:37 Note: Reviewed        Physical Exam  General appearance: Well nourished, well developed, and well hydrated. In no apparent acute distress Mental status: Alert, oriented x 3 (person, place, & time)       Respiratory: No evidence of acute respiratory distress Eyes: PERLA Vitals:  BP 126/65 (BP Location: Right Arm, Patient Position: Sitting, Cuff Size: Normal)   Pulse 84   Temp (!) 97.1 F (36.2 C) (Temporal)   Resp 16   Ht 5' (1.524 m)   Wt 145 lb (65.8 kg)   SpO2 96%   BMI 28.32 kg/m  BMI: Estimated body mass index is 28.32 kg/m as calculated from the following:   Height as of this encounter: 5' (1.524 m).   Weight as of this encounter: 145 lb (65.8 kg). Ideal: Ideal body weight: 45.5 kg (100 lb 4.9 oz) Adjusted ideal body weight: 53.6 kg (118 lb 3 oz)  Assessment   Status Diagnosis  Controlled Controlled Controlled 1. Pharmacologic therapy      Updated Problems: No problems updated.  Plan of Care  Problem-specific:  No problem-specific Assessment & Plan notes found for this encounter.  Jacqueline Thornton has a current medication list which includes the following long-term medication(s): calcium, vitamin b-12, duloxetine, enalapril, esomeprazole, furosemide, gabapentin, oxycodone-acetaminophen, [START ON 05/16/2021] oxycodone-acetaminophen, and [START ON 06/15/2021] oxycodone-acetaminophen.  Pharmacotherapy (Medications Ordered): Meds ordered this encounter  Medications   oxyCODONE-acetaminophen (PERCOCET) 7.5-325 MG tablet    Sig: Take 1 tablet by mouth 5 (five) times daily. Must last 30 days    Dispense:  150 tablet    Refill:  0    DO NOT: delete (not duplicate); no partial-fill (will deny script to complete), no refill request (F/U required). DISPENSE: 1 day early if closed on fill date. WARN: No CNS-depressants within 8 hrs of med.   oxyCODONE-acetaminophen (PERCOCET) 7.5-325 MG tablet    Sig: Take 1 tablet by mouth 5 (five) times daily. Must last 30 days    Dispense:  150 tablet    Refill:  0    DO NOT: delete (not duplicate); no partial-fill (will deny script to complete), no refill request (F/U required). DISPENSE: 1 day early if closed on fill date. WARN: No CNS-depressants within 8 hrs of med.   oxyCODONE-acetaminophen (PERCOCET)  7.5-325 MG tablet    Sig: Take 1 tablet by mouth 5 (five) times daily. Must last 30 days    Dispense:  150 tablet    Refill:  0    DO NOT: delete (not duplicate); no partial-fill (will deny script to complete), no refill request (F/U required). DISPENSE: 1 day early if closed on fill date. WARN: No CNS-depressants within 8 hrs of med.    Orders:  No orders of the defined types were placed in this encounter.  Follow-up plan:   Return in about 3 months (around 07/15/2021) for Eval-day (M,W), (F2F), (MM).     Interventional Therapies  Risk  Complexity Considerations:   Estimated body mass index is 28.32 kg/m as calculated from the following:   Height as of this encounter: 5' (1.524 m).   Weight as of this encounter: 145 lb (65.8 kg). WNL   Planned  Pending:   (L) L-FCT RFA   Under consideration:   Diagnostic caudal diagnostic epidurogram Diagnostic CESI  Diagnostic bilateral cervical facet block  Possible bilateral  cervical facet RFA  Diagnostic left L4-5 LESI  Diagnostic left S1 SNRB    Completed:   Therapeutic right Racz procedure x1 (08/17/2020) (100% relief of the right lower extremity pain) Diagnostic caudal ESI x1 (07/11/2020) Palliative left L5-S1 LESI x2 (07/27/2015)  Diagnostic left lumbar facet MBB x6 (05/14/2018)  Palliative right lumbar facet MBB x9 (06/13/2020)  Therapeutic right shoulder joint injection x1 (02/29/2016)  Therapeutic left IA hip injection x3 (06/13/2020)  Diagnostic right SI joint block x3 (06/17/2017)  Diagnostic/therapeutic left SI joint block x4 (06/17/2017)  Palliative bilateral trochanteric bursa injection x1 (12/11/2016)  Therapeutic right lumbar facet RFA x2 (01/11/2021)  Therapeutic left lumbar facet RFA x2 (12/19/2020) (100/100/75/>75) Therapeutic right SI joint RFA x1 (08/07/2017)  Therapeutic left SI joint RFA x1 (09/16/2017)    Therapeutic  Palliative (PRN) options:   Palliative Racz procedure  Palliative bilateral lumbar facet block   Palliative bilateral SI joint block  Palliative right trochanteric bursa injection     Recent Visits No visits were found meeting these conditions. Showing recent visits within past 90 days and meeting all other requirements Today's Visits Date Type Provider Dept  04/16/21 Office Visit Milinda Pointer, MD [facility]  Showing today's visits and meeting all other requirements Future Appointments Date Type Provider Dept  07/09/21 Appointment Milinda Pointer, MD [facility]  Showing future appointments within next 90 days and meeting all other requirements I discussed the assessment and treatment plan with the patient. The patient was provided an opportunity to ask questions and all were answered. The patient agreed with the plan and demonstrated an understanding of the instructions.  Patient advised to call back or seek an in-person evaluation if the symptoms or condition worsens.  Duration of encounter: 30 minutes.  Note by: Gaspar Cola, MD Date: 04/16/2021; Time: 5:17 PM

## 2021-04-16 ENCOUNTER — Ambulatory Visit: Payer: Medicare Other | Attending: Pain Medicine | Admitting: Pain Medicine

## 2021-04-16 ENCOUNTER — Other Ambulatory Visit: Payer: Self-pay

## 2021-04-16 ENCOUNTER — Encounter: Payer: Self-pay | Admitting: Pain Medicine

## 2021-04-16 DIAGNOSIS — Z79899 Other long term (current) drug therapy: Secondary | ICD-10-CM | POA: Diagnosis present

## 2021-04-16 MED ORDER — OXYCODONE-ACETAMINOPHEN 7.5-325 MG PO TABS: 1.0000 | ORAL_TABLET | Freq: Every day | ORAL | 0 refills | Status: DC

## 2021-04-16 NOTE — Patient Instructions (Signed)
____________________________________________________________________________________________  Virtual Visits   What is a "Virtual Visit"? It is a healthcare communication encounter (medical visit) that takes place on real time (NOT TEXT or E-MAIL) over the telephone or computer device (desktop, laptop, tablet, smart phone, etc.). It allows for more location flexibility between the patient and the healthcare provider.  Who decides when these types of visits will be used? The physician.  Who is eligible for these types of visits? Only those patients that can be reliably reached over the telephone.  What do you mean by reliably? We do not have time to call everyone multiple times, therefore those that tend to screen calls and then call back later are not suitable candidates for this system. We understand how people are reluctant to pickup on "unknown" calls, therefore, we suggest adding our telephone numbers to your list of "CONTACT(s)". This way, you should be able to readily identify our calls when you receive one. All of our numbers are available below.   Who is not eligible? This option is not available for medication management encounters, specially for controlled substances. Patients on pain medications that fall under the category of controlled substances have to come in for "Face-to-Face" encounters. This is required for mandatory monitoring of these substances. You may be asked to provide a sample for an unannounced urine drug screening test (UDS), and we will need to count your pain pills. Not bringing your pills to be counted may result in no refill. Obviously, neither one of these can be done over the phone.  When will this type of visits be used? You can request a virtual visit whenever you are physically unable to attend a regular appointment. The decision will be made by the physician (or healthcare provider) on a case by case basis.   At what time will I be called? This is an  excellent question. The providers will try to call you whenever they have time available. Do not expect to be called at any specific time. The secretaries will assign you a time for your virtual visit appointment, but this is done simply to keep a list of those patients that need to be called, but not for the purpose of keeping a time schedule. Be advised that the call may come in anytime during the day, between the hours of 8:00 AM and 8::00 PM, depending on provider availability. We do understand that the system is not perfect. If you are unable to be available that day on a moments notice, then request an "in-person" appointment rather than a "virtual visit".  Can I request my medication visits to be "Virtual"? Yes you may request it, but the decision is entirely up to the healthcare provider. Control substances require specific monitoring that requires Face-to-Face encounters. The number of encounters  and the extent of the monitoring is determined on a case by case basis.  Add a new contact to your smart phone and label it "PAIN CLINIC" Under this contact add the following numbers: Main: [phone] (Official Contact Number) Nurses: [phone] (These are outgoing only calling systems. Do not call this number.) Dr. Wilberta Dorvil: [phone] or [phone] (Outgoing calls only. Do not call this number.)  ____________________________________________________________________________________________   

## 2021-04-16 NOTE — Progress Notes (Signed)
Nursing Pain Medication Assessment:  Safety precautions to be maintained throughout the outpatient stay will include: orient to surroundings, keep bed in low position, maintain call bell within reach at all times, provide assistance with transfer out of bed and ambulation.  Medication Inspection Compliance: Pill count conducted under aseptic conditions, in front of the patient. Neither the pills nor the bottle was removed from the patient's sight at any time. Once count was completed pills were immediately returned to the patient in their original bottle.  Medication: Oxycodone IR Pill/Patch Count: No pills available to be counted. Pill/Patch Appearance:  bottle empty Bottle Appearance: Standard pharmacy container. Clearly labeled. Filled Date: 09 / 15 / 2022 Last Medication intake:  Today

## 2021-04-18 ENCOUNTER — Encounter: Payer: Medicare Other | Admitting: Pain Medicine

## 2021-05-04 ENCOUNTER — Encounter: Payer: Self-pay | Admitting: Emergency Medicine

## 2021-05-04 ENCOUNTER — Emergency Department
Admission: EM | Admit: 2021-05-04 | Discharge: 2021-05-05 | Disposition: A | Discharge status: Home / Self Care | Payer: Medicare Other | Attending: Emergency Medicine | Admitting: Emergency Medicine

## 2021-05-04 ENCOUNTER — Other Ambulatory Visit: Payer: Self-pay

## 2021-05-04 ENCOUNTER — Emergency Department: Payer: Medicare Other

## 2021-05-04 DIAGNOSIS — R0789 Other chest pain: Secondary | ICD-10-CM | POA: Insufficient documentation

## 2021-05-04 DIAGNOSIS — M7989 Other specified soft tissue disorders: Secondary | ICD-10-CM | POA: Insufficient documentation

## 2021-05-04 DIAGNOSIS — M79661 Pain in right lower leg: Secondary | ICD-10-CM | POA: Insufficient documentation

## 2021-05-04 DIAGNOSIS — Z5321 Procedure and treatment not carried out due to patient leaving prior to being seen by health care provider: Secondary | ICD-10-CM | POA: Insufficient documentation

## 2021-05-04 NOTE — ED Triage Notes (Signed)
Pt here with constant pressure in her chest for 2 weeks now, also here with right lower extremity swelling with calf pain. Hx of dvt left leg. NAD. Pt appears pale.

## 2021-05-04 NOTE — ED Provider Notes (Signed)
Emergency Medicine Provider Triage Evaluation Note  Jacqueline Thornton , a 70 y.o. female  was evaluated in triage.  Pt complains of chest pressure for the past 2 weeks and has developed right calf pain over the past week. Upon awakening this morning, she had swelling in the right foot and ankle. History of DVT. Not on blood thinners currently.  Review of Systems  Positive: Right lower extremity swelling and calf pain; Chest pain Negative: Shortness of breath  Physical Exam  There were no vitals taken for this visit. Gen:   Awake, no distress   Resp:  Normal effort  MSK:   Moves extremities without difficulty  Other:    Medical Decision Making  Medically screening exam initiated at 4:52 PM.  Appropriate orders placed.  Jacqueline Thornton was informed that the remainder of the evaluation will be completed by another provider, this initial triage assessment does not replace that evaluation, and the importance of remaining in the ED until their evaluation is complete.   Victorino Dike, FNP 05/04/21 1656    Arta Silence, MD 05/04/21 1947

## 2021-05-17 ENCOUNTER — Other Ambulatory Visit: Payer: Self-pay | Admitting: Internal Medicine

## 2021-05-17 ENCOUNTER — Other Ambulatory Visit: Payer: Self-pay

## 2021-05-17 ENCOUNTER — Ambulatory Visit
Admission: RE | Admit: 2021-05-17 | Discharge: 2021-05-17 | Disposition: A | Discharge status: Home / Self Care | Payer: Medicare Other | Source: Ambulatory Visit | Attending: Internal Medicine | Admitting: Internal Medicine

## 2021-05-17 DIAGNOSIS — M79605 Pain in left leg: Secondary | ICD-10-CM | POA: Diagnosis present

## 2021-05-17 DIAGNOSIS — M7989 Other specified soft tissue disorders: Secondary | ICD-10-CM | POA: Insufficient documentation

## 2021-05-29 ENCOUNTER — Other Ambulatory Visit: Payer: Self-pay | Admitting: Internal Medicine

## 2021-05-29 DIAGNOSIS — R1033 Periumbilical pain: Secondary | ICD-10-CM

## 2021-05-29 DIAGNOSIS — R195 Other fecal abnormalities: Secondary | ICD-10-CM

## 2021-06-19 IMAGING — US US EXTREM LOW VENOUS*L*
1 series · 14 of 24 positions shown · non-contrast
Comparison: None.

CLINICAL DATA: Left lower leg pain and swelling.

EXAM:
LEFT LOWER EXTREMITY VENOUS DOPPLER ULTRASOUND
TECHNIQUE: Gray-scale sonography with compression, as well as color and duplex
ultrasound, were performed to evaluate the deep venous system(s)
from the level of the common femoral vein through the popliteal and
proximal calf veins.

[Series 1: us venous img lower uni left (dvt) · portal-venous · 14 of 34 slices shown]
[im 1/34]
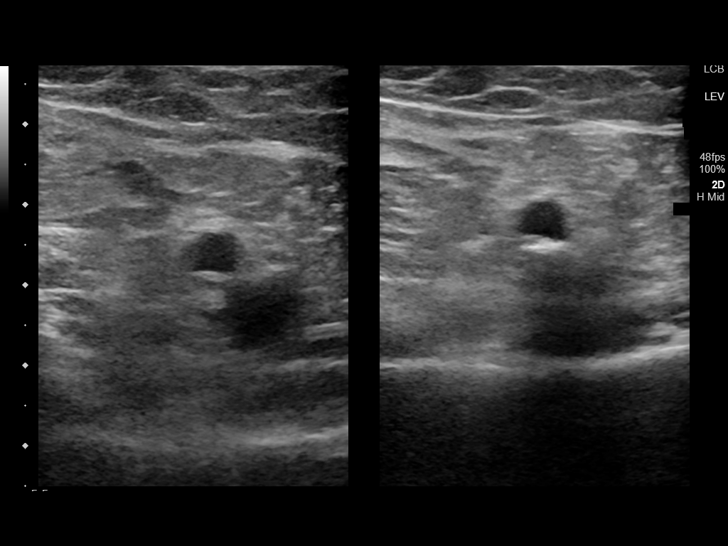
[im 3/34]
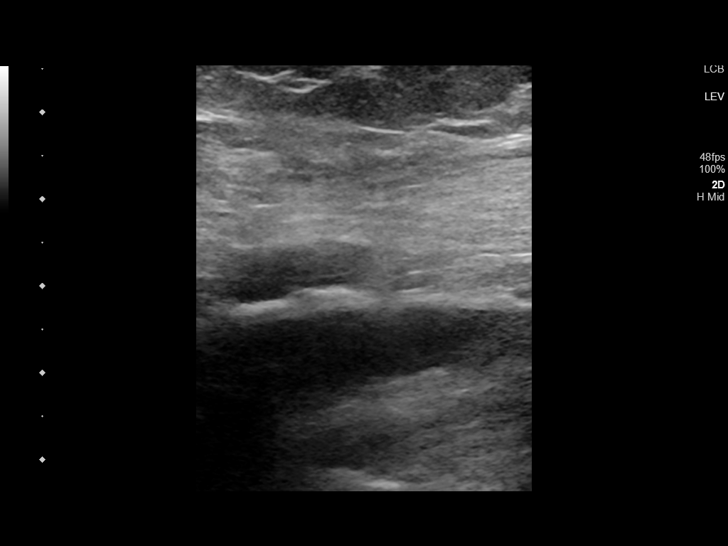
[im 6/34]
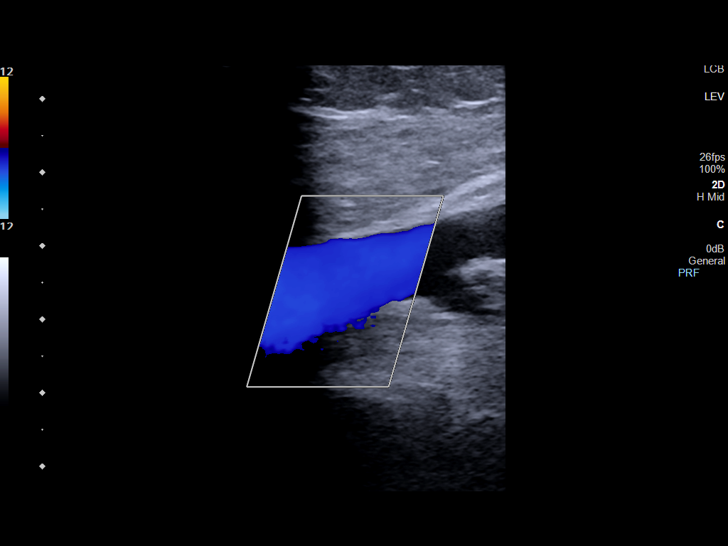
[im 9/34]
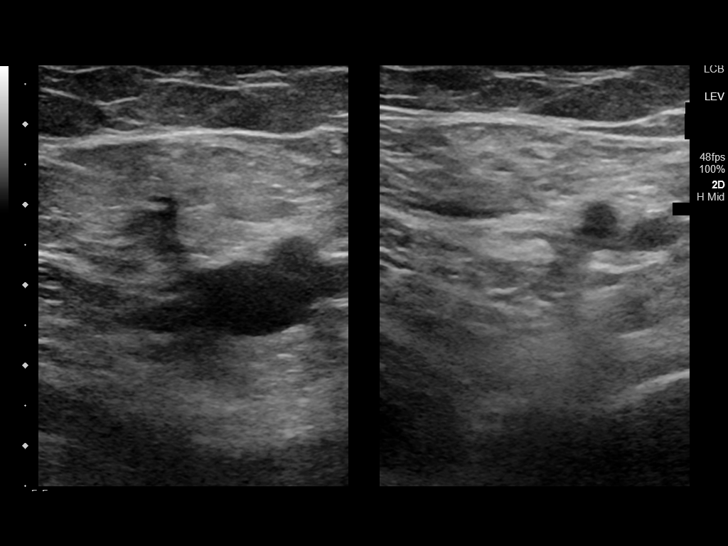
[im 11/34]
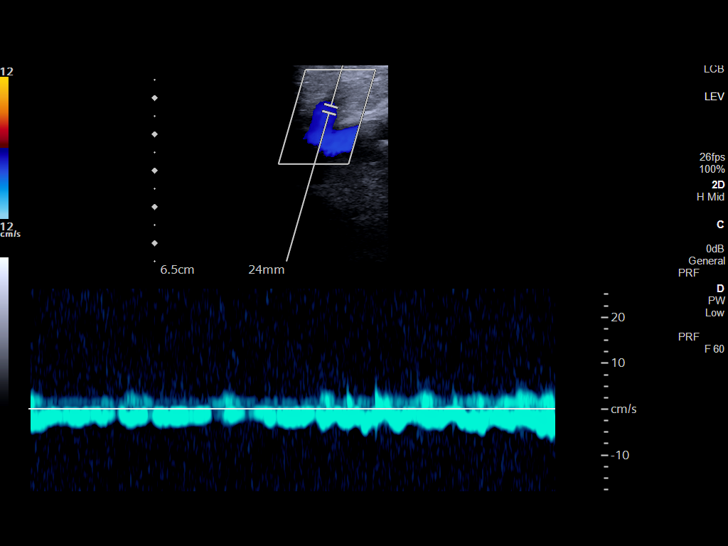
[im 13/34]
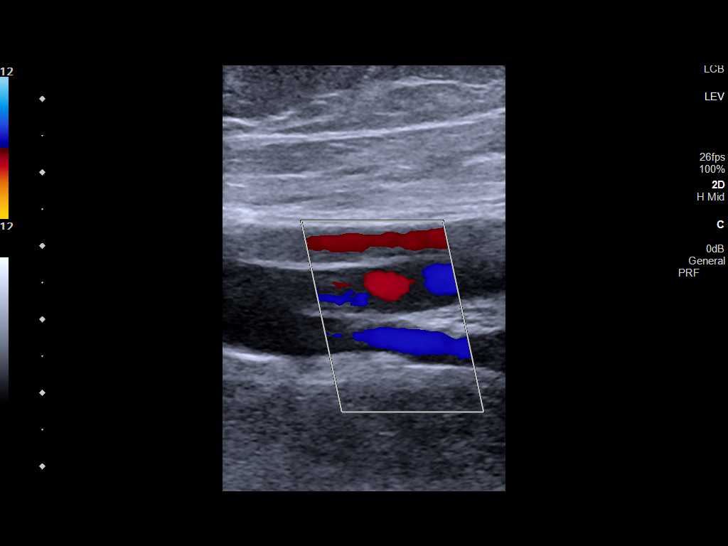
[im 16/34]
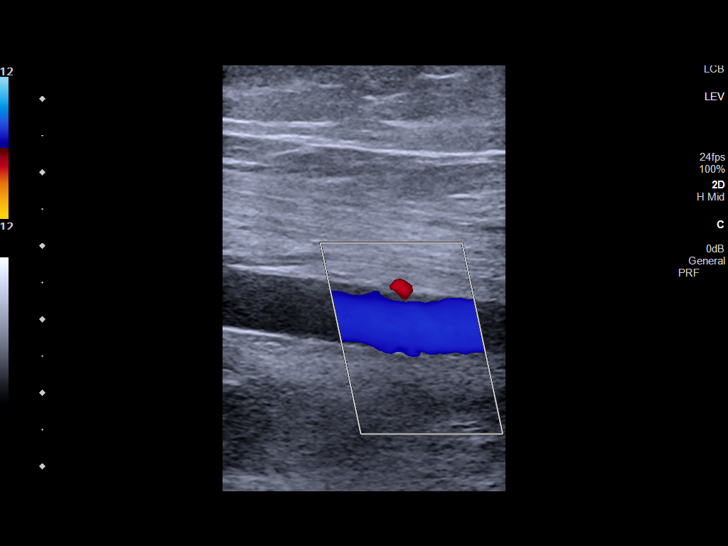
[im 18/34]
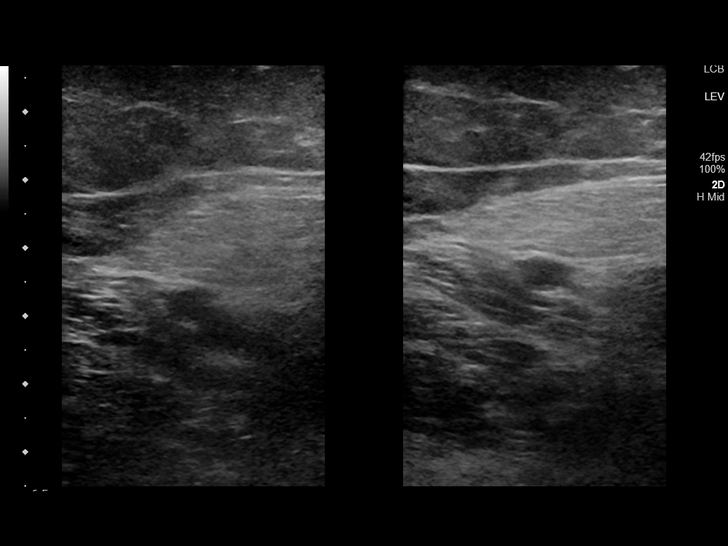
[im 21/34]
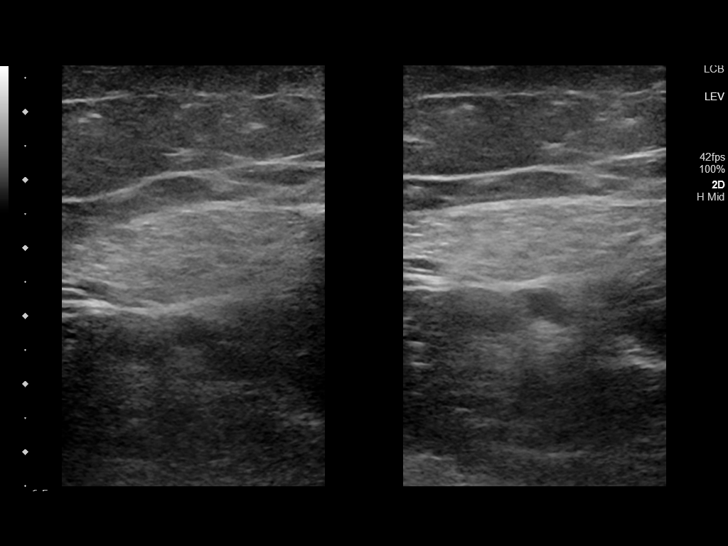
[im 23/34]
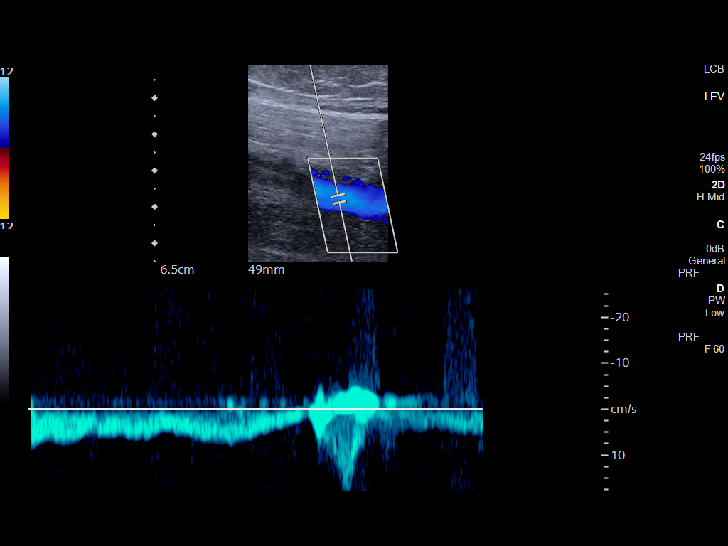
[im 26/34]
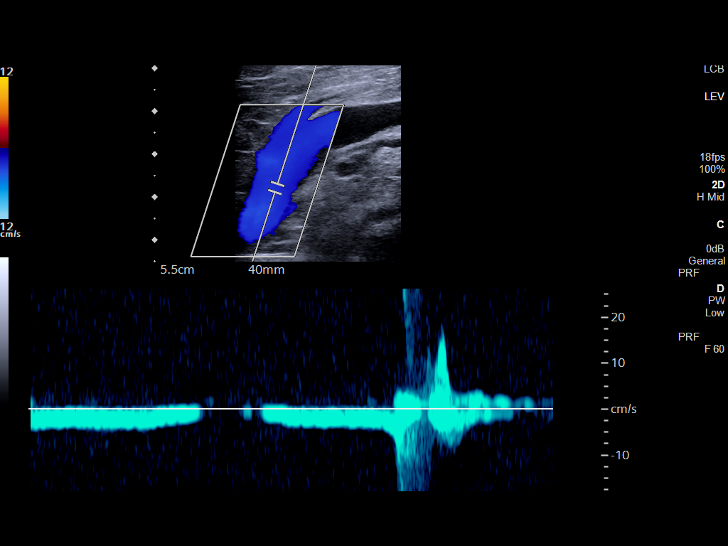
[im 28/34]
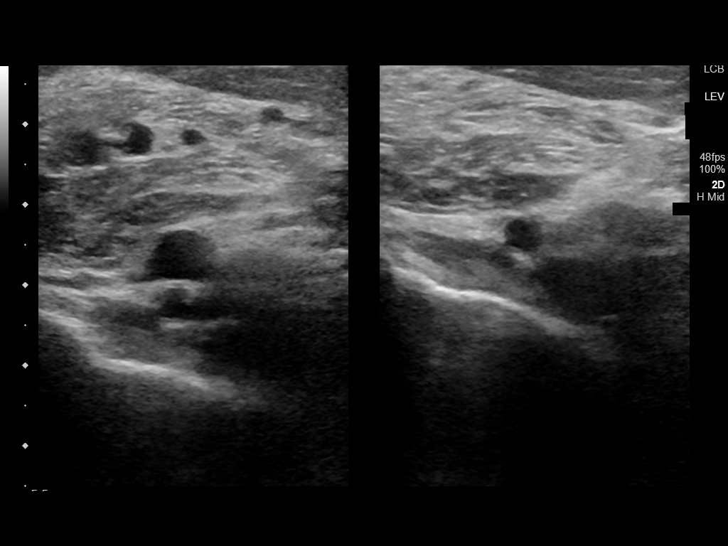
[im 31/34]
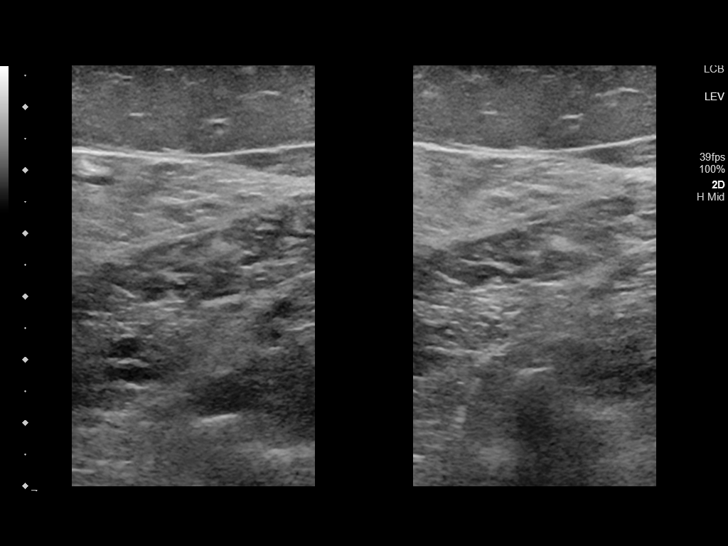
[im 34/34]
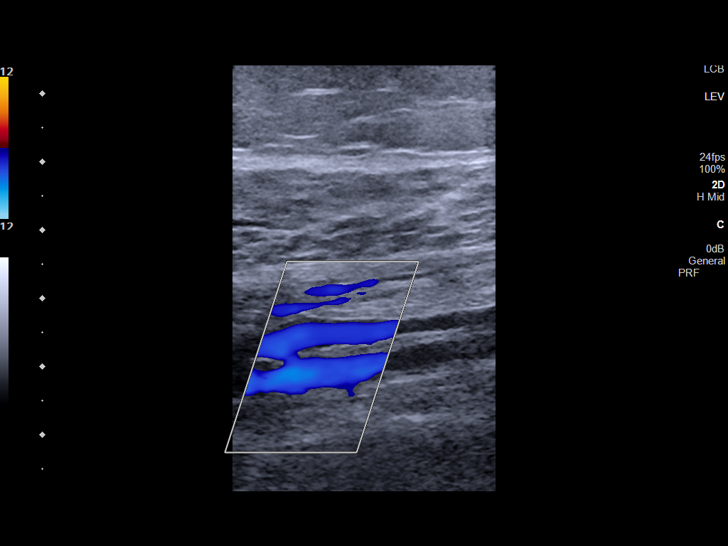

[14 of 24 positions shown; findings below may reference images not displayed]

FINDINGS: VENOUS

Normal compressibility of the common femoral, superficial femoral,
and popliteal veins, as well as the visualized calf veins.
Visualized portions of profunda femoral vein and great saphenous
vein unremarkable. No filling defects to suggest DVT on grayscale or
color Doppler imaging. Doppler waveforms show normal direction of
venous flow, normal respiratory plasticity and response to
augmentation.

Limited views of the contralateral common femoral vein are
unremarkable.

OTHER

None.

Limitations: none
IMPRESSION: Negative exam.

## 2021-06-20 ENCOUNTER — Other Ambulatory Visit: Payer: Self-pay

## 2021-06-20 ENCOUNTER — Ambulatory Visit
Admission: RE | Admit: 2021-06-20 | Discharge: 2021-06-20 | Disposition: A | Discharge status: Home / Self Care | Payer: Medicare Other | Source: Ambulatory Visit | Attending: Internal Medicine | Admitting: Internal Medicine

## 2021-06-20 DIAGNOSIS — R1033 Periumbilical pain: Secondary | ICD-10-CM | POA: Insufficient documentation

## 2021-06-20 DIAGNOSIS — R195 Other fecal abnormalities: Secondary | ICD-10-CM | POA: Diagnosis present

## 2021-06-20 LAB — POCT I-STAT CREATININE: Creatinine, Ser: 0.9 mg/dL (ref 0.44–1.00)

## 2021-06-20 MED ORDER — IOHEXOL 300 MG/ML  SOLN
80.0000 mL | Freq: Once | INTRAMUSCULAR | Status: AC | PRN
  Administered 2021-06-20: 14:00:00 80 mL via INTRAVENOUS

## 2021-07-03 DIAGNOSIS — Z800 Family history of malignant neoplasm of digestive organs: Secondary | ICD-10-CM | POA: Insufficient documentation

## 2021-07-03 DIAGNOSIS — K219 Gastro-esophageal reflux disease without esophagitis: Secondary | ICD-10-CM | POA: Insufficient documentation

## 2021-07-03 DIAGNOSIS — Z86010 Personal history of colonic polyps: Secondary | ICD-10-CM | POA: Insufficient documentation

## 2021-07-03 DIAGNOSIS — K5900 Constipation, unspecified: Secondary | ICD-10-CM | POA: Insufficient documentation

## 2021-07-03 DIAGNOSIS — R1310 Dysphagia, unspecified: Secondary | ICD-10-CM | POA: Insufficient documentation

## 2021-07-05 NOTE — Progress Notes (Signed)
PROVIDER NOTE: Information contained herein reflects review and annotations entered in association with encounter. Interpretation of such information and data should be left to medically-trained personnel. Information provided to patient can be located elsewhere in the medical record under "Patient Instructions". Document created using STT-dictation technology, any transcriptional errors that may result from process are unintentional.    Patient: Jacqueline Thornton  Service Category: E/M  Provider: Gaspar Cola, MD  DOB: 1951-07-01  DOS: 07/09/2021  Specialty: Interventional Pain Management  MRN: [id number]  Setting: Ambulatory outpatient  PCP: Perrin Maltese, MD  Type: Established Patient    Referring Provider: Perrin Maltese, MD  Location: Office  Delivery: Face-to-face     HPI  Jacqueline Thornton, a 71 y.o. year old female, is here today because of her Chronic pain syndrome [G89.4]. [name]'s primary complain today is Back Pain (low) Last encounter: My last encounter with her was on 04/16/2021. Pertinent problems: [name] has Chronic pain syndrome; Headache, migraine; Chronic low back pain (2ry area of Pain) (Bilateral) (R>L) w/o sciatica; Spondylosis of lumbar spine; Lumbar annular disc tear (L4-5); Discogenic low back pain (L3-4 and L4-5); Lumbar facet hypertrophy; Lumbar facet syndrome (Bilateral) (R>L); Chronic neck pain (Left); Cervical spondylosis; Hx of cervical spine surgery; Cervical spinal fusion (C6-7 interbody fusion); Chronic sacroiliac joint pain (Bilateral) (L>R); Chronic lumbar radicular pain (S1 Dermatome) (1ry area of Pain) (Bilateral); Trochanteric bursitis of hip (Right); Chronic hip pain (Bilateral) (L>R); Neurogenic pain; Myofascial pain; Osteoarthritis of hip (Left); Arthrodesis status; DDD (degenerative disc disease), lumbar; Myalgia; Trochanteric bursitis of hip (Bilateral) (L>R); Spondylosis without myelopathy or radiculopathy, lumbosacral region; Spondylosis  without myelopathy or radiculopathy, sacral and sacrococcygeal region; Other specified dorsopathies, sacral and sacrococcygeal region; Age-related osteoporosis without current pathological fracture; Bone island of right femur; Inflammatory spondylopathy of lumbar region [facility]); Trigger point with back pain (Right); Fibromyalgia; DDD (degenerative disc disease), cervical; Spondylolisthesis of lumbar region; History of lumbar spinal fusion (Dr. Erline Levine) (12/11/2018); Failed back surgical syndrome; Unilateral occipital headache (Left); Cervicogenic headache (Left); Chronic hip pain (Left); Spondylolisthesis at L5-S1 level; Spondylosis of lumbosacral region without myelopathy or radiculopathy; Epidural fibrosis; Enthesopathy of hip region (Left); Chronic sacroiliac joint pain (Left); Enthesopathy of sacroiliac joint (Left); Somatic dysfunction of sacroiliac joint (Left); Herniated cervical disc without myelopathy; Chronic sacroiliac joint pain (Right); Osteoarthritis of sacroiliac joint (Right) ([place]); Somatic dysfunction of sacroiliac joint (Right); Chronic hip pain (Right); Neck pain; Herniated nucleus pulposus, C3-4; Prolapsed cervical intervertebral disc; Cervical spinal stenosis; and Radiculopathy, cervical region on their pertinent problem list. Pain Assessment: Severity of Chronic pain is reported as a 8 /10. Location: Back Lower/denies. Onset: More than a month ago. Quality: Stabbing, Aching. Timing: Constant. Modifying factor(s): medication, heat. Vitals:  height is 5' (1.524 m) and weight is 143 lb (64.9 kg). Her temperature is 97.1 F (36.2 C) (abnormal). Her blood pressure is 143/69 (abnormal) and her pulse is 89. Her respiration is 16 and oxygen saturation is 99%.   Reason for encounter: medication management.   The patient indicates doing well with the current medication regimen. No adverse reactions or side effects reported to the medications.  The patient indicates currently experiencing an  increase in her knee pain.  I have offered to look into this further and perhaps do some intra-articular knee injections but the patient indicated that at this point she is doing okay and she would prefer to wait.  RTCB: 10/13/2021 Nonopioids transferred 05/24/2020: Vitamin B12, Flexeril, and Neurontin.  Pharmacotherapy Assessment  Analgesic: Oxycodone/APAP 7.5/325, 1 tab PO q 5 times a day. (56.25 mg/day of oxycodone) MME/day: 56.25 mg/day.   Monitoring: [place] PMP: PDMP reviewed during this encounter.       Pharmacotherapy: No side-effects or adverse reactions reported. Compliance: No problems identified. Effectiveness: Clinically acceptable.  Dewayne Shorter, RN  07/09/2021  3:33 PM  Sign when Signing Visit Nursing Pain Medication Assessment:  Safety precautions to be maintained throughout the outpatient stay will include: orient to surroundings, keep bed in low position, maintain call bell within reach at all times, provide assistance with transfer out of bed and ambulation.  Medication Inspection Compliance: Pill count conducted under aseptic conditions, in front of the patient. Neither the pills nor the bottle was removed from the patient's sight at any time. Once count was completed pills were immediately returned to the patient in their original bottle.  Medication: Oxycodone/APAP Pill/Patch Count:  25 of 150 pills remain Pill/Patch Appearance: Markings consistent with prescribed medication Bottle Appearance: Standard pharmacy container. Clearly labeled. Filled Date: 80 / 14 / 2022 Last Medication intake:  Today    UDS:  Summary  Date Value Ref Range Status  12/13/2020 Note  Final    Comment:    ==================================================================== [employer] [facility] (MW) ==================================================================== Test                             Result       Flag       Units  Drug Present and Declared for Prescription Verification    Oxycodone                      3279         EXPECTED   ng/mg creat   Oxymorphone                    691          EXPECTED   ng/mg creat   Noroxycodone                   6071         EXPECTED   ng/mg creat   Noroxymorphone                 305          EXPECTED   ng/mg creat    Sources of oxycodone are scheduled prescription medications.    Oxymorphone, noroxycodone, and noroxymorphone are expected    metabolites of oxycodone. Oxymorphone is also available as a    scheduled prescription medication.  ==================================================================== Test                      Result    Flag   Units      Ref Range   Creatinine              128              mg/dL      >=20 ==================================================================== Declared Medications:  The flagging and interpretation on this report are based on the  following declared medications.  Unexpected results may arise from  inaccuracies in the declared medications.   **Note: The testing scope of this panel includes these medications:   Oxycodone (Percocet)   **Note: The testing scope of this panel does not include the  following reported medications:   Acetaminophen (Percocet)  Bempedoic Acid (Nexletol)  Calcium  Duloxetine (Cymbalta)  Enalapril (Vasotec)  Esomeprazole (Nexium)  Estradiol (Estrace)  Furosemide (Lasix)  Gabapentin (Neurontin)  Melatonin  Methocarbamol (Robaxin)  Multivitamin  Sertraline (Zoloft)  Vitamin B12 ==================================================================== For clinical consultation, please call [phone]. ====================================================================      ROS  Constitutional: Denies any fever or chills Gastrointestinal: No reported hemesis, hematochezia, vomiting, or acute GI distress Musculoskeletal: Denies any acute onset joint swelling, redness, loss of ROM, or weakness Neurological: No reported episodes of acute onset  apraxia, aphasia, dysarthria, agnosia, amnesia, paralysis, loss of coordination, or loss of consciousness  Medication Review  Calcium, DULoxetine, Melatonin, Vitamin B-12, enalapril, esomeprazole, estradiol, furosemide, gabapentin, methocarbamol, multivitamin with minerals, and oxyCODONE-acetaminophen  History Review  Allergy: Ms. Hobbs has No Known Allergies. Drug: Ms. Rodden  reports no history of drug use. Alcohol:  reports no history of alcohol use. Tobacco:  reports that she quit smoking about 18 years ago. Her smoking use included cigarettes. She has never used smokeless tobacco. Social: Ms. Casalino  reports that she quit smoking about 18 years ago. Her smoking use included cigarettes. She has never used smokeless tobacco. She reports that she does not drink alcohol and does not use drugs. Medical:  has a past medical history of Absolute anemia (04/11/2015), Acute postoperative pain (08/07/2017), Angina pectoris ([place]), Anxiety, Arthritis, Arthropathy of sacroiliac joint (04/11/2015), Atypical face pain (04/24/2015), Back pain, CAD (coronary artery disease), Cancer ([place]), Chronic back pain, Constipation, Depression, Fibromyalgia, GERD (gastroesophageal reflux disease), 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) (04/11/2015), H/O: hysterectomy (1979), Heart murmur, Hyperlipidemia, Hypertension, Low back pain (04/06/2015), Lumbar radicular pain (04/18/2015), Migraine, Narrowing of intervertebral disc space (04/11/2015), Sacroiliac joint pain (04/11/2015), and Spine disorder. Surgical: Ms. Shor  has a past surgical history that includes Cholecystectomy; Abdominal hysterectomy; Neck surgery; Shoulder surgery; Colonoscopy with propofol (N/A, 07/19/2015); caract surger; Laparoscopic salpingo oophorectomy (Bilateral, 03/03/2018); Cystoscopy (03/03/2018); Tonsillectomy; Appendectomy; Tumor removal; Anterior lumbar fusion (N/A, 12/11/2018); Abdominal exposure (N/A, 12/11/2018); and Anterior cervical  decomp/discectomy fusion (N/A, 10/06/2020). Family: family history includes Colon cancer in her maternal grandmother and mother; Diabetes in her father and mother; Hypertension in her sister; Stroke in her mother.  Laboratory Chemistry Profile   Renal Lab Results  Component Value Date   BUN 8 10/06/2020   CREATININE 0.90 06/20/2021   BCR 8 (L) 12/16/2019   GFRAA 47 (L) 12/16/2019   GFRNONAA >60 10/06/2020    Hepatic Lab Results  Component Value Date   AST 15 12/16/2019   ALT 14 03/08/2018   ALBUMIN 4.0 12/16/2019   ALKPHOS 60 12/16/2019    Electrolytes Lab Results  Component Value Date   NA 137 10/06/2020   K 3.6 10/06/2020   CL 105 10/06/2020   CALCIUM 8.6 (L) 10/06/2020   MG 2.4 (H) 12/16/2019    Bone Lab Results  Component Value Date   25OHVITD1 53 12/16/2019   25OHVITD2 <1.0 12/16/2019   25OHVITD3 53 12/16/2019    Inflammation (CRP: Acute Phase) (ESR: Chronic Phase) Lab Results  Component Value Date   CRP 4 12/16/2019   ESRSEDRATE 6 12/16/2019         Note: Above Lab results reviewed.  Recent Imaging Review  CT ABDOMEN PELVIS W CONTRAST CLINICAL DATA:  Periumbilical pain. Has developed right calf pain over the past week. Upon awakening this morning, she had swelling in the right AND left foot and ankle mostly left. History of DVT. Not on blood thinners  EXAM: CT ABDOMEN AND PELVIS WITH CONTRAST  TECHNIQUE: Multidetector CT imaging of the abdomen  and pelvis was performed using the standard protocol following bolus administration of intravenous contrast.  CONTRAST:  43m OMNIPAQUE IOHEXOL 300 MG/ML  SOLN  COMPARISON:  None.  FINDINGS: Lower chest: No acute abnormality.  Hepatobiliary: No focal liver abnormality. No gallstones, gallbladder wall thickening, or pericholecystic fluid. No biliary dilatation.  Pancreas: Diffusely atrophic. No focal lesion. Otherwise normal pancreatic contour. No surrounding inflammatory changes. No main pancreatic  ductal dilatation.  Spleen: Normal in size without focal abnormality.  Adrenals/Urinary Tract:  No adrenal nodule bilaterally.  Bilateral kidneys enhance symmetrically.  No hydronephrosis. No hydroureter.  The urinary bladder is unremarkable.  On delayed imaging, there is no urothelial wall thickening and there are no filling defects in the opacified portions of the bilateral collecting systems or ureters.  Stomach/Bowel: Stomach is within normal limits. No evidence of bowel wall thickening or dilatation. Intramural fat infiltration along the ascending colon suggestive of chronic inflammatory changes. Stool throughout the colon. Status post appendectomy.  Vascular/Lymphatic: No abdominal aorta or iliac aneurysm. Moderate to severe atherosclerotic plaque of the aorta and its branches. No abdominal, pelvic, or inguinal lymphadenopathy.  Reproductive: Status post hysterectomy. No adnexal masses.  Other: No intraperitoneal free fluid. No intraperitoneal free gas. No organized fluid collection.  Musculoskeletal:  No abdominal wall hernia or abnormality.  No suspicious lytic or blastic osseous lesions. No acute displaced fracture. L5-S1 surgical hardware. Multilevel degenerative changes of the spine.  IMPRESSION: 1. No acute intra-abdominal or intrapelvic abnormality. 2. Diffusely atrophic pancreas. 3.  Aortic Atherosclerosis (ICD10-I70.0).  Electronically Signed   By: MIven FinnM.D.   On: 06/20/2021 17:40 Note: Reviewed        Physical Exam  General appearance: Well nourished, well developed, and well hydrated. In no apparent acute distress Mental status: Alert, oriented x 3 (person, place, & time)       Respiratory: No evidence of acute respiratory distress Eyes: PERLA Vitals: BP (!) 143/69 (BP Location: Left Arm, Patient Position: Sitting, Cuff Size: Normal)    Pulse 89    Temp (!) 97.1 F (36.2 C)    Resp 16    Ht 5' (1.524 m)    Wt 143 lb (64.9 kg)    SpO2  99%    BMI 27.93 kg/m  BMI: Estimated body mass index is 27.93 kg/m as calculated from the following:   Height as of this encounter: 5' (1.524 m).   Weight as of this encounter: 143 lb (64.9 kg). Ideal: Ideal body weight: 45.5 kg (100 lb 4.9 oz) Adjusted ideal body weight: 53.2 kg (117 lb 6.2 oz)  Assessment   Status Diagnosis  Controlled Controlled Controlled 1. Chronic pain syndrome   2. Chronic lumbar radicular pain (S1 Dermatome) (1ry area of Pain) (Bilateral)   3. Chronic low back pain (2ry area of Pain) (Bilateral) (R>L) w/o sciatica   4. Failed back surgical syndrome   5. DDD (degenerative disc disease), lumbar   6. Lumbar facet syndrome (Bilateral) (R>L)   7. Chronic hip pain (Bilateral) (L>R)   8. Chronic neck pain (Left)   9. Pharmacologic therapy   10. Chronic use of opiate for therapeutic purpose   11. Encounter for medication management      Updated Problems: No problems updated.   Plan of Care  Problem-specific:  No problem-specific Assessment & Plan notes found for this encounter.  Ms. GKHLOIE HAMADAhas a current medication list which includes the following long-term medication(s): calcium, vitamin b-12, duloxetine, enalapril, esomeprazole, furosemide, gabapentin, [START ON 07/15/2021]  oxycodone-acetaminophen, [START ON 08/14/2021] oxycodone-acetaminophen, and [START ON 09/13/2021] oxycodone-acetaminophen.  Pharmacotherapy (Medications Ordered): Meds ordered this encounter  Medications   oxyCODONE-acetaminophen (PERCOCET) 7.5-325 MG tablet    Sig: Take 1 tablet by mouth 5 (five) times daily. Must last 30 days    Dispense:  150 tablet    Refill:  0    DO NOT: delete (not duplicate); no partial-fill (will deny script to complete), no refill request (F/U required). DISPENSE: 1 day early if closed on fill date. WARN: No CNS-depressants within 8 hrs of med.   oxyCODONE-acetaminophen (PERCOCET) 7.5-325 MG tablet    Sig: Take 1 tablet by mouth 5 (five) times  daily. Must last 30 days    Dispense:  150 tablet    Refill:  0    DO NOT: delete (not duplicate); no partial-fill (will deny script to complete), no refill request (F/U required). DISPENSE: 1 day early if closed on fill date. WARN: No CNS-depressants within 8 hrs of med.   oxyCODONE-acetaminophen (PERCOCET) 7.5-325 MG tablet    Sig: Take 1 tablet by mouth 5 (five) times daily. Must last 30 days    Dispense:  150 tablet    Refill:  0    DO NOT: delete (not duplicate); no partial-fill (will deny script to complete), no refill request (F/U required). DISPENSE: 1 day early if closed on fill date. WARN: No CNS-depressants within 8 hrs of med.   Orders:  No orders of the defined types were placed in this encounter.  Follow-up plan:   Return in about 3 months (around 10/13/2021) for Eval-day (M,W), (F2F), (MM).     Interventional Therapies  Risk   Complexity Considerations:   Estimated body mass index is 28.32 kg/m as calculated from the following:   Height as of this encounter: 5' (1.524 m).   Weight as of this encounter: 145 lb (65.8 kg). WNL   Planned   Pending:   (L) L-FCT RFA   Under consideration:   Diagnostic caudal diagnostic epidurogram Diagnostic CESI  Diagnostic bilateral cervical facet block  Possible bilateral cervical facet RFA  Diagnostic left L4-5 LESI  Diagnostic left S1 SNRB    Completed:   Therapeutic right Racz procedure x1 (08/17/2020) (04/09/84/85) (100% relief of the right lower extremity pain) Diagnostic caudal ESI x1 (07/11/2020) (100/100/0/0)  Palliative left L5-S1 LESI x2 (07/27/2015) (70/70/0/0)  Diagnostic left lumbar facet MBB x6 (05/14/2018) (n/a)  Palliative right lumbar facet MBB x9 (06/13/2020) (100/100/30 x3 days/>50)  Therapeutic right shoulder joint injection x1 (02/29/2016) (90/80/30)  Therapeutic left IA hip injection x3 (06/13/2020) (100/100/30/>50)  Diagnostic right SI joint block x3 (06/17/2017) (100/90/80/80)  Diagnostic/therapeutic left SI  joint block x4 (06/17/2017) (100/90/80/80)  Palliative bilateral trochanteric bursa injection x1 (12/11/2016) (100/100/90/90)  Therapeutic right lumbar facet RFA x2 (01/11/2021) (100/100/70)  Therapeutic left lumbar facet RFA x2 (12/19/2020) (100/100/75/>75) Therapeutic right SI joint RFA x1 (08/07/2017) (100/100/80)  Therapeutic left SI joint RFA x1 (09/16/2017) (100/100/100)    Therapeutic   Palliative (PRN) options:   Palliative Racz procedure  Palliative bilateral lumbar facet block  Palliative bilateral SI joint block  Palliative right trochanteric bursa injection     Recent Visits Date Type Provider Dept  07/09/21 Office Visit Milinda Pointer, MD [facility]  04/16/21 Office Visit Milinda Pointer, MD [facility]  Showing recent visits within past 90 days and meeting all other requirements Future Appointments Date Type Provider Dept  10/08/21 Appointment Milinda Pointer, MD [facility]  Showing future appointments within next  90 days and meeting all other requirements  I discussed the assessment and treatment plan with the patient. The patient was provided an opportunity to ask questions and all were answered. The patient agreed with the plan and demonstrated an understanding of the instructions.  Patient advised to call back or seek an in-person evaluation if the symptoms or condition worsens.  Duration of encounter: 30 minutes.  Note by: Gaspar Cola, MD Date: 07/09/2021; Time: 6:40 AM

## 2021-07-09 ENCOUNTER — Encounter: Payer: Self-pay | Admitting: Pain Medicine

## 2021-07-09 ENCOUNTER — Ambulatory Visit: Payer: Medicare Other | Attending: Pain Medicine | Admitting: Pain Medicine

## 2021-07-09 ENCOUNTER — Other Ambulatory Visit: Payer: Self-pay

## 2021-07-09 VITALS — BP 143/69 | HR 89 | Temp 97.1°F | Resp 16 | Ht 60.0 in | Wt 143.0 lb

## 2021-07-09 DIAGNOSIS — M25552 Pain in left hip: Secondary | ICD-10-CM | POA: Diagnosis present

## 2021-07-09 DIAGNOSIS — Z79899 Other long term (current) drug therapy: Secondary | ICD-10-CM | POA: Diagnosis present

## 2021-07-09 DIAGNOSIS — M545 Low back pain, unspecified: Secondary | ICD-10-CM | POA: Insufficient documentation

## 2021-07-09 DIAGNOSIS — M542 Cervicalgia: Secondary | ICD-10-CM | POA: Diagnosis present

## 2021-07-09 DIAGNOSIS — M5416 Radiculopathy, lumbar region: Secondary | ICD-10-CM | POA: Insufficient documentation

## 2021-07-09 DIAGNOSIS — G894 Chronic pain syndrome: Secondary | ICD-10-CM

## 2021-07-09 DIAGNOSIS — M25551 Pain in right hip: Secondary | ICD-10-CM | POA: Insufficient documentation

## 2021-07-09 DIAGNOSIS — G8929 Other chronic pain: Secondary | ICD-10-CM

## 2021-07-09 DIAGNOSIS — M47816 Spondylosis without myelopathy or radiculopathy, lumbar region: Secondary | ICD-10-CM | POA: Diagnosis present

## 2021-07-09 DIAGNOSIS — Z79891 Long term (current) use of opiate analgesic: Secondary | ICD-10-CM | POA: Diagnosis present

## 2021-07-09 DIAGNOSIS — M961 Postlaminectomy syndrome, not elsewhere classified: Secondary | ICD-10-CM

## 2021-07-09 DIAGNOSIS — M5136 Other intervertebral disc degeneration, lumbar region: Secondary | ICD-10-CM | POA: Diagnosis present

## 2021-07-09 DIAGNOSIS — M51369 Other intervertebral disc degeneration, lumbar region without mention of lumbar back pain or lower extremity pain: Secondary | ICD-10-CM

## 2021-07-09 MED ORDER — OXYCODONE-ACETAMINOPHEN 7.5-325 MG PO TABS: 1.0000 | ORAL_TABLET | Freq: Every day | ORAL | 0 refills | Status: DC

## 2021-07-09 NOTE — Progress Notes (Signed)
Nursing Pain Medication Assessment:  Safety precautions to be maintained throughout the outpatient stay will include: orient to surroundings, keep bed in low position, maintain call bell within reach at all times, provide assistance with transfer out of bed and ambulation.  Medication Inspection Compliance: Pill count conducted under aseptic conditions, in front of the patient. Neither the pills nor the bottle was removed from the patient's sight at any time. Once count was completed pills were immediately returned to the patient in their original bottle.  Medication: Oxycodone/APAP Pill/Patch Count:  25 of 150 pills remain Pill/Patch Appearance: Markings consistent with prescribed medication Bottle Appearance: Standard pharmacy container. Clearly labeled. Filled Date: 48 / 5 / 2022 Last Medication intake:  Today

## 2021-09-11 ENCOUNTER — Other Ambulatory Visit: Payer: Self-pay

## 2021-09-11 ENCOUNTER — Ambulatory Visit
Admission: RE | Admit: 2021-09-11 | Discharge: 2021-09-11 | Disposition: A | Discharge status: Home / Self Care | Payer: Medicare Other | Attending: Gastroenterology | Admitting: Gastroenterology

## 2021-09-11 ENCOUNTER — Ambulatory Visit: Payer: Medicare Other | Admitting: Anesthesiology

## 2021-09-11 ENCOUNTER — Encounter
Admission: RE | Disposition: A | Discharge status: Home / Self Care | Payer: Self-pay | Source: Home / Self Care | Attending: Gastroenterology

## 2021-09-11 ENCOUNTER — Encounter: Payer: Self-pay | Admitting: *Deleted

## 2021-09-11 DIAGNOSIS — Z86718 Personal history of other venous thrombosis and embolism: Secondary | ICD-10-CM | POA: Diagnosis not present

## 2021-09-11 DIAGNOSIS — Z800 Family history of malignant neoplasm of digestive organs: Secondary | ICD-10-CM | POA: Diagnosis not present

## 2021-09-11 DIAGNOSIS — Z87891 Personal history of nicotine dependence: Secondary | ICD-10-CM | POA: Diagnosis not present

## 2021-09-11 DIAGNOSIS — I10 Essential (primary) hypertension: Secondary | ICD-10-CM | POA: Insufficient documentation

## 2021-09-11 DIAGNOSIS — K2970 Gastritis, unspecified, without bleeding: Secondary | ICD-10-CM | POA: Insufficient documentation

## 2021-09-11 DIAGNOSIS — K219 Gastro-esophageal reflux disease without esophagitis: Secondary | ICD-10-CM | POA: Diagnosis not present

## 2021-09-11 DIAGNOSIS — D509 Iron deficiency anemia, unspecified: Secondary | ICD-10-CM | POA: Diagnosis present

## 2021-09-11 DIAGNOSIS — F419 Anxiety disorder, unspecified: Secondary | ICD-10-CM | POA: Diagnosis not present

## 2021-09-11 DIAGNOSIS — M797 Fibromyalgia: Secondary | ICD-10-CM | POA: Insufficient documentation

## 2021-09-11 HISTORY — PX: COLONOSCOPY WITH PROPOFOL: SHX5780

## 2021-09-11 HISTORY — PX: ESOPHAGOGASTRODUODENOSCOPY (EGD) WITH PROPOFOL: SHX5813

## 2021-09-11 SURGERY — ESOPHAGOGASTRODUODENOSCOPY (EGD) WITH PROPOFOL
Anesthesia: General

## 2021-09-11 MED ORDER — PROPOFOL 10 MG/ML IV BOLUS
INTRAVENOUS | Status: DC | PRN
Start: 2021-09-11 — End: 2021-09-11
  Administered 2021-09-11: 80 mg via INTRAVENOUS

## 2021-09-11 MED ORDER — PROPOFOL 500 MG/50ML IV EMUL
INTRAVENOUS | Status: AC
  Filled 2021-09-11: qty 50

## 2021-09-11 MED ORDER — SODIUM CHLORIDE 0.9 % IV SOLN: INTRAVENOUS | Status: DC

## 2021-09-11 MED ORDER — LIDOCAINE HCL (CARDIAC) PF 100 MG/5ML IV SOSY
PREFILLED_SYRINGE | INTRAVENOUS | Status: DC | PRN
  Administered 2021-09-11: 100 mg via INTRAVENOUS

## 2021-09-11 MED ORDER — PROPOFOL 500 MG/50ML IV EMUL
INTRAVENOUS | Status: DC | PRN
  Administered 2021-09-11: 175 ug/kg/min via INTRAVENOUS

## 2021-09-11 NOTE — Anesthesia Preprocedure Evaluation (Addendum)
Anesthesia Evaluation  ?Patient identified by MRN, date of birth, ID band ?Patient awake ? ? ? ?Reviewed: ?Allergy & Precautions, NPO status , Patient's Chart, lab work & pertinent test results ? ?History of Anesthesia Complications ?Negative for: history of anesthetic complications ? ?Airway ?Mallampati: III ? ? ?Neck ROM: Full ? ? ? Dental ? ?(+) Implants ?  ?Pulmonary ?former smoker (quit 2005),  ?  ?Pulmonary exam normal ?breath sounds clear to auscultation ? ? ? ? ? ? Cardiovascular ?hypertension, Normal cardiovascular exam ?Rhythm:Regular Rate:Normal ? ?ECG 05/04/21: Normal sinus rhythm ?Cannot rule out Anterior infarct , age undetermined ?  ?Neuro/Psych ? Headaches, PSYCHIATRIC DISORDERS Anxiety Depression Chronic back pain ?  ? GI/Hepatic ?GERD  ,  ?Endo/Other  ?negative endocrine ROS ? Renal/GU ?negative Renal ROS  ? ?  ?Musculoskeletal ? ?(+) Arthritis , Fibromyalgia - ? Abdominal ?  ?Peds ? Hematology ? ?(+) Blood dyscrasia, anemia , Hx DVT 2019   ?Anesthesia Other Findings ? ? Reproductive/Obstetrics ? ?  ? ? ? ? ? ? ? ? ? ? ? ? ? ?  ?  ? ? ? ? ? ? ? ?Anesthesia Physical ?Anesthesia Plan ? ?ASA: 2 ? ?Anesthesia Plan: General  ? ?Post-op Pain Management:   ? ?Induction: Intravenous ? ?PONV Risk Score and Plan: 3 and Propofol infusion, TIVA and Treatment may vary due to age or medical condition ? ?Airway Management Planned: Natural Airway ? ?Additional Equipment:  ? ?Intra-op Plan:  ? ?Post-operative Plan:  ? ?Informed Consent: I have reviewed the patients History and Physical, chart, labs and discussed the procedure including the risks, benefits and alternatives for the proposed anesthesia with the patient or authorized representative who has indicated his/her understanding and acceptance.  ? ? ? ? ? ?Plan Discussed with: CRNA ? ?Anesthesia Plan Comments: (LMA/GETA backup discussed.  Patient consented for risks of anesthesia including but not limited to:  ?- adverse  reactions to medications ?- damage to eyes, teeth, lips or other oral mucosa ?- nerve damage due to positioning  ?- sore throat or hoarseness ?- damage to heart, brain, nerves, lungs, other parts of body or loss of life ? ?Informed patient about role of CRNA in peri- and intra-operative care.  Patient voiced understanding.)  ? ? ? ? ? ? ?Anesthesia Quick Evaluation ? ?

## 2021-09-11 NOTE — Anesthesia Postprocedure Evaluation (Signed)
Anesthesia Post Note ? ?Patient: Jacqueline Thornton ? ?Procedure(s) Performed: ESOPHAGOGASTRODUODENOSCOPY (EGD) WITH PROPOFOL ?COLONOSCOPY WITH PROPOFOL ? ?Patient location during evaluation: PACU ?Anesthesia Type: General ?Level of consciousness: awake and alert, oriented and patient cooperative ?Pain management: pain level controlled ?Vital Signs Assessment: post-procedure vital signs reviewed and stable ?Respiratory status: spontaneous breathing, nonlabored ventilation and respiratory function stable ?Cardiovascular status: blood pressure returned to baseline and stable ?Postop Assessment: adequate PO intake ?Anesthetic complications: no ? ? ?No notable events documented. ? ? ?Last Vitals:  ?Vitals:  ? 09/11/21 1030 09/11/21 1040  ?BP: 137/63 137/63  ?Pulse: 63 66  ?Resp: 11 14  ?Temp:    ?SpO2: 98% 98%  ?  ?Last Pain:  ?Vitals:  ? 09/11/21 0923  ?TempSrc: Temporal  ?PainSc: 3   ? ? ?  ?  ?  ?  ?  ?  ? ?Darrin Nipper ? ? ? ? ?

## 2021-09-11 NOTE — Transfer of Care (Signed)
Immediate Anesthesia Transfer of Care Note ? ?Patient: Jacqueline Thornton ? ?Procedure(s) Performed: ESOPHAGOGASTRODUODENOSCOPY (EGD) WITH PROPOFOL ?COLONOSCOPY WITH PROPOFOL ? ?Patient Location: Endoscopy Unit ? ?Anesthesia Type:General ? ?Level of Consciousness: awake ? ?Airway & Oxygen Therapy: Patient Spontanous Breathing ? ?Post-op Assessment: Report given to RN and Post -op Vital signs reviewed and stable ? ?Post vital signs: Reviewed and stable ? ?Last Vitals:  ?Vitals Value Taken Time  ?BP 123/64 09/11/21 1025  ?Temp    ?Pulse 68 09/11/21 1025  ?Resp 10 09/11/21 1025  ?SpO2 99 % 09/11/21 1025  ? ? ?Last Pain:  ?Vitals:  ? 09/11/21 0923  ?TempSrc: Temporal  ?PainSc: 3   ?   ? ?  ? ?Complications: No notable events documented. ?

## 2021-09-11 NOTE — Op Note (Signed)
[facility] ?Gastroenterology ?Patient Name: Jacqueline Thornton ?Procedure Date: 09/11/2021 9:45 AM ?MRN: [id number] ?Account #: [id number] ?Date of Birth: September 25, 1950 ?Admit Type: Outpatient ?Age: 71 ?Room: [facility] ENDO ROOM 1 ?Gender: Female ?Note Status: Finalized ?Instrument Name: Upper Endoscope [id number] ?Procedure:             Upper GI endoscopy ?Indications:           Iron deficiency anemia, Gastro-esophageal reflux  ?                       disease ?Providers:             Andrey Farmer MD, MD ?Referring MD:          Perrin Maltese, MD (Referring MD) ?Medicines:             Monitored Anesthesia Care ?Complications:         No immediate complications. Estimated blood loss:  ?                       Minimal. ?Procedure:             Pre-Anesthesia Assessment: ?                       - Prior to the procedure, a History and Physical was  ?                       performed, and patient medications and allergies were  ?                       reviewed. The patient is competent. The risks and  ?                       benefits of the procedure and the sedation options and  ?                       risks were discussed with the patient. All questions  ?                       were answered and informed consent was obtained.  ?                       Patient identification and proposed procedure were  ?                       verified by the physician, the nurse, the  ?                       anesthesiologist, the anesthetist and the technician  ?                       in the endoscopy suite. Mental Status Examination:  ?                       alert and oriented. Airway Examination: normal  ?                       oropharyngeal airway and neck mobility. Respiratory  ?  Examination: clear to auscultation. CV Examination:  ?                       normal. Prophylactic Antibiotics: The patient does not  ?                       require prophylactic antibiotics. Prior  ?                        Anticoagulants: The patient has taken no previous  ?                       anticoagulant or antiplatelet agents. ASA Grade  ?                       Assessment: II - A patient with mild systemic disease.  ?                       After reviewing the risks and benefits, the patient  ?                       was deemed in satisfactory condition to undergo the  ?                       procedure. The anesthesia plan was to use monitored  ?                       anesthesia care (MAC). Immediately prior to  ?                       administration of medications, the patient was  ?                       re-assessed for adequacy to receive sedatives. The  ?                       heart rate, respiratory rate, oxygen saturations,  ?                       blood pressure, adequacy of pulmonary ventilation, and  ?                       response to care were monitored throughout the  ?                       procedure. The physical status of the patient was  ?                       re-assessed after the procedure. ?                       After obtaining informed consent, the endoscope was  ?                       passed under direct vision. Throughout the procedure,  ?                       the patient's blood pressure, pulse, and oxygen  ?  saturations were monitored continuously. The Endoscope  ?                       was introduced through the mouth, and advanced to the  ?                       second part of duodenum. The upper GI endoscopy was  ?                       accomplished without difficulty. The patient tolerated  ?                       the procedure well. ?Findings: ?     The examined esophagus was normal. ?     Patchy mild inflammation characterized by erythema was found in the  ?     gastric antrum. Biopsies were taken with a cold forceps for Helicobacter  ?     pylori testing. Estimated blood loss was minimal. ?     The examined duodenum was normal. ?Impression:            - Normal esophagus. ?                        - Gastritis. Biopsied. ?                       - Normal examined duodenum. ?Recommendation:        - Await pathology results. ?                       - Perform a colonoscopy today. ?Procedure Code(s):     --- Professional --- ?                       [phone], Esophagogastroduodenoscopy, flexible,  ?                       transoral; with biopsy, single or multiple ?Diagnosis Code(s):     --- Professional --- ?                       K29.70, Gastritis, unspecified, without bleeding ?                       D50.9, Iron deficiency anemia, unspecified ?                       K21.9, Gastro-esophageal reflux disease without  ?                       esophagitis ?CPT copyright 2019 American Medical Association. All rights reserved. ?The codes documented in this report are preliminary and upon coder review may  ?be revised to meet current compliance requirements. ?Andrey Farmer MD, MD ?09/11/2021 10:23:41 AM ?Number of Addenda: 0 ?Note Initiated On: 09/11/2021 9:45 AM ?Estimated Blood Loss:  Estimated blood loss was minimal. ?     [facility] ?

## 2021-09-11 NOTE — Op Note (Signed)
[facility] ?Gastroenterology ?Patient Name: Jacqueline Thornton ?Procedure Date: 09/11/2021 9:40 AM ?MRN: [id number] ?Account #: [id number] ?Date of Birth: 1951/03/22 ?Admit Type: Outpatient ?Age: 71 ?Room: [facility] ENDO ROOM 1 ?Gender: Female ?Note Status: Finalized ?Instrument Name: Colonoscope [id number] ?Procedure:             Colonoscopy ?Indications:           Iron deficiency anemia ?Providers:             Andrey Farmer MD, MD ?Referring MD:          Perrin Maltese, MD (Referring MD) ?Medicines:             Monitored Anesthesia Care ?Complications:         No immediate complications. ?Procedure:             Pre-Anesthesia Assessment: ?                       - Prior to the procedure, a History and Physical was  ?                       performed, and patient medications and allergies were  ?                       reviewed. The patient is competent. The risks and  ?                       benefits of the procedure and the sedation options and  ?                       risks were discussed with the patient. All questions  ?                       were answered and informed consent was obtained.  ?                       Patient identification and proposed procedure were  ?                       verified by the physician, the nurse, the  ?                       anesthesiologist, the anesthetist and the technician  ?                       in the endoscopy suite. Mental Status Examination:  ?                       alert and oriented. Airway Examination: normal  ?                       oropharyngeal airway and neck mobility. Respiratory  ?                       Examination: clear to auscultation. CV Examination:  ?                       normal. Prophylactic Antibiotics: The patient does not  ?  require prophylactic antibiotics. Prior  ?                       Anticoagulants: The patient has taken no previous  ?                       anticoagulant or antiplatelet agents. ASA Grade  ?                        Assessment: II - A patient with mild systemic disease.  ?                       After reviewing the risks and benefits, the patient  ?                       was deemed in satisfactory condition to undergo the  ?                       procedure. The anesthesia plan was to use monitored  ?                       anesthesia care (MAC). Immediately prior to  ?                       administration of medications, the patient was  ?                       re-assessed for adequacy to receive sedatives. The  ?                       heart rate, respiratory rate, oxygen saturations,  ?                       blood pressure, adequacy of pulmonary ventilation, and  ?                       response to care were monitored throughout the  ?                       procedure. The physical status of the patient was  ?                       re-assessed after the procedure. ?                       After obtaining informed consent, the colonoscope was  ?                       passed under direct vision. Throughout the procedure,  ?                       the patient's blood pressure, pulse, and oxygen  ?                       saturations were monitored continuously. The  ?                       Colonoscope was introduced through the anus with the  ?  intention of advancing to the cecum. The scope was  ?                       advanced to the ascending colon before the procedure  ?                       was aborted. Medications were given. The colonoscopy  ?                       was technically difficult and complex due to  ?                       restricted mobility of the colon. Successful  ?                       completion of the procedure was aided by withdrawing  ?                       the scope and replacing with the pediatric  ?                       colonoscope. The patient tolerated the procedure well.  ?                       The quality of the bowel preparation was poor. ?Findings: ?     The  perianal and digital rectal examinations were normal. ?     A large amount of semi-solid stool was found in the ascending colon,  ?     precluding visualization. ?Impression:            - Preparation of the colon was poor. ?                       - Stool in the ascending colon. ?                       - No specimens collected. ?Recommendation:        - Discharge patient to home. ?                       - Resume previous diet. ?                       - Continue present medications. ?                       - Repeat colonoscopy at the next available appointment  ?                       because the bowel preparation was suboptimal.  ?                       Recommend starting with pediatric colonoscope due to  ?                       fixation of colon in sigmoid colon. ?                       - Return to referring physician as previously  ?  scheduled. ?Procedure Code(s):     --- Professional --- ?                       [phone], 53, Colonoscopy, flexible; diagnostic,  ?                       including collection of specimen(s) by brushing or  ?                       washing, when performed (separate procedure) ?Diagnosis Code(s):     --- Professional --- ?                       D50.9, Iron deficiency anemia, unspecified ?CPT copyright 2019 American Medical Association. All rights reserved. ?The codes documented in this report are preliminary and upon coder review may  ?be revised to meet current compliance requirements. ?Andrey Farmer MD, MD ?09/11/2021 10:27:06 AM ?Number of Addenda: 0 ?Note Initiated On: 09/11/2021 9:40 AM ?Total Procedure Duration: 0 hours 14 minutes 35 seconds  ?Estimated Blood Loss:  Estimated blood loss: none. ?     [facility] ?

## 2021-09-12 ENCOUNTER — Encounter: Payer: Self-pay | Admitting: Gastroenterology

## 2021-09-12 LAB — SURGICAL PATHOLOGY

## 2021-09-12 NOTE — H&P (Signed)
Outpatient short stay form Pre-procedure ?09/12/2021  ?Lesly Rubenstein, MD ? ?Primary Physician: Perrin Maltese, MD ? ?Reason for visit: IDA ? ?History of present illness:   ? ?71 y/o lady here for EGD/Colonoscopy for IDA. Has family history of colon cancer. History of chronic pain. ? ? ?No current facility-administered medications for this encounter. ? ?Current Outpatient Medications:  ?  CALCIUM PO, Take 1 tablet by mouth daily., Disp: , Rfl:  ?  DULoxetine (CYMBALTA) 60 MG capsule, Take 60 mg by mouth at bedtime. , Disp: , Rfl:  ?  enalapril (VASOTEC) 5 MG tablet, Take 5 mg by mouth daily., Disp: , Rfl:  ?  esomeprazole (NEXIUM) 20 MG capsule, Take 20 mg by mouth at bedtime. , Disp: , Rfl:  ?  estradiol (ESTRACE) 0.5 MG tablet, Take 0.5 mg by mouth at bedtime. , Disp: , Rfl:  ?  furosemide (LASIX) 20 MG tablet, Take 20 mg by mouth daily., Disp: , Rfl:  ?  Melatonin 10 MG TABS, Take 10 mg by mouth at bedtime as needed (for sleep)., Disp: , Rfl:  ?  methocarbamol (ROBAXIN) 500 MG tablet, Take 1 tablet (500 mg total) by mouth every 6 (six) hours as needed for muscle spasms., Disp: 90 tablet, Rfl: 0 ?  Multiple Vitamin (MULTIVITAMIN WITH MINERALS) TABS tablet, Take 1 tablet by mouth daily., Disp: , Rfl:  ?  oxyCODONE-acetaminophen (PERCOCET) 7.5-325 MG tablet, Take 1 tablet by mouth 5 (five) times daily. Must last 30 days, Disp: 150 tablet, Rfl: 0 ?  Cyanocobalamin (VITAMIN B-12) 5000 MCG SUBL, Place 1 tablet (5,000 mcg total) under the tongue daily., Disp: 30 tablet, Rfl: 2 ?  gabapentin (NEURONTIN) 300 MG capsule, Take 1 capsule (300 mg total) by mouth 2 (two) times daily., Disp: 60 capsule, Rfl: 2 ?  oxyCODONE-acetaminophen (PERCOCET) 7.5-325 MG tablet, Take 1 tablet by mouth 5 (five) times daily. Must last 30 days, Disp: 150 tablet, Rfl: 0 ?  [START ON 09/13/2021] oxyCODONE-acetaminophen (PERCOCET) 7.5-325 MG tablet, Take 1 tablet by mouth 5 (five) times daily. Must last 30 days, Disp: 150 tablet, Rfl: 0 ? ?No  medications prior to admission.  ? ? ? ?No Known Allergies ? ? ?Past Medical History:  ?Diagnosis Date  ? Absolute anemia 04/11/2015  ? Acute postoperative pain 08/07/2017  ? Angina pectoris ([place])   ? pt denies  ? Anxiety   ? Arthritis   ? Arthropathy of sacroiliac joint 04/11/2015  ? Atypical face pain 04/24/2015  ? Back pain   ? CAD (coronary artery disease)   ? pt denies  ? Cancer [facility])   ? squamous cell- R temple   ? Chronic back pain   ? Constipation   ? Depression   ? Fibromyalgia   ? GERD (gastroesophageal reflux disease)   ? 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) 04/11/2015  ? H/O: hysterectomy 1979  ? Heart murmur   ? Hyperlipidemia   ? Hypertension   ? Low back pain 04/06/2015  ? Lumbar radicular pain 04/18/2015  ? Migraine   ? Narrowing of intervertebral disc space 04/11/2015  ? Sacroiliac joint pain 04/11/2015  ? Spine disorder   ? ? ?Review of systems:  Otherwise negative.  ? ? ?Physical Exam ? ?Gen: Alert, oriented. Appears stated age.  ?HEENT: PERRLA. ?Lungs: No respiratory distress ?CV: RRR ?Abd: soft, benign, no masses ?Ext: No edema ? ? ? ?Planned procedures: Proceed with EGD/colonoscopy. The patient understands the nature of the planned procedure, indications, risks, alternatives and potential  complications including but not limited to bleeding, infection, perforation, damage to internal organs and possible oversedation/side effects from anesthesia. The patient agrees and gives consent to proceed.  ?Please refer to procedure notes for findings, recommendations and patient disposition/instructions.  ? ? ? ?Lesly Rubenstein, MD ?[facility] ? ? ? ?  ? ?

## 2021-09-12 NOTE — Interval H&P Note (Signed)
History and Physical Interval Note: ? ?09/12/2021 ?3:14 PM ? ?Jacqueline Thornton  has presented today for surgery, with the diagnosis of HX Adenomatous Polyps ?FH Colon Cancer ?Anemia ?GERD.  The various methods of treatment have been discussed with the patient and family. After consideration of risks, benefits and other options for treatment, the patient has consented to  Procedure(s): ?ESOPHAGOGASTRODUODENOSCOPY (EGD) WITH PROPOFOL (N/A) ?COLONOSCOPY WITH PROPOFOL (N/A) as a surgical intervention.  The patient's history has been reviewed, patient examined, no change in status, stable for surgery.  I have reviewed the patient's chart and labs.  Questions were answered to the patient's satisfaction.   ? ? ?Hilton Cork Shaneca Orne ? ?Ok to proceed with EGD/Colonoscopy ? ?

## 2021-09-25 DIAGNOSIS — R1013 Epigastric pain: Secondary | ICD-10-CM | POA: Insufficient documentation

## 2021-09-30 DIAGNOSIS — R768 Other specified abnormal immunological findings in serum: Secondary | ICD-10-CM | POA: Insufficient documentation

## 2021-10-08 ENCOUNTER — Ambulatory Visit: Payer: Medicare Other | Attending: Pain Medicine | Admitting: Pain Medicine

## 2021-10-08 ENCOUNTER — Encounter: Payer: Self-pay | Admitting: Pain Medicine

## 2021-10-08 VITALS — BP 141/87 | HR 82 | Temp 97.2°F | Resp 18 | Ht 60.0 in | Wt 142.0 lb

## 2021-10-08 DIAGNOSIS — M51369 Other intervertebral disc degeneration, lumbar region without mention of lumbar back pain or lower extremity pain: Secondary | ICD-10-CM

## 2021-10-08 DIAGNOSIS — Z79899 Other long term (current) drug therapy: Secondary | ICD-10-CM

## 2021-10-08 DIAGNOSIS — M5136 Other intervertebral disc degeneration, lumbar region: Secondary | ICD-10-CM | POA: Diagnosis present

## 2021-10-08 DIAGNOSIS — Z79891 Long term (current) use of opiate analgesic: Secondary | ICD-10-CM

## 2021-10-08 DIAGNOSIS — M545 Low back pain, unspecified: Secondary | ICD-10-CM | POA: Diagnosis present

## 2021-10-08 DIAGNOSIS — M47816 Spondylosis without myelopathy or radiculopathy, lumbar region: Secondary | ICD-10-CM

## 2021-10-08 DIAGNOSIS — M25552 Pain in left hip: Secondary | ICD-10-CM | POA: Diagnosis present

## 2021-10-08 DIAGNOSIS — M542 Cervicalgia: Secondary | ICD-10-CM | POA: Diagnosis present

## 2021-10-08 DIAGNOSIS — M5416 Radiculopathy, lumbar region: Secondary | ICD-10-CM | POA: Insufficient documentation

## 2021-10-08 DIAGNOSIS — G8929 Other chronic pain: Secondary | ICD-10-CM

## 2021-10-08 DIAGNOSIS — M961 Postlaminectomy syndrome, not elsewhere classified: Secondary | ICD-10-CM | POA: Diagnosis present

## 2021-10-08 DIAGNOSIS — G894 Chronic pain syndrome: Secondary | ICD-10-CM

## 2021-10-08 DIAGNOSIS — M25551 Pain in right hip: Secondary | ICD-10-CM

## 2021-10-08 MED ORDER — OXYCODONE-ACETAMINOPHEN 7.5-325 MG PO TABS: 1.0000 | ORAL_TABLET | Freq: Every day | ORAL | 0 refills | Status: DC

## 2021-10-08 NOTE — Progress Notes (Signed)
PROVIDER NOTE: Information contained herein reflects review and annotations entered in association with encounter. Interpretation of such information and data should be left to medically-trained personnel. Information provided to patient can be located elsewhere in the medical record under "Patient Instructions". Document created using STT-dictation technology, any transcriptional errors that may result from process are unintentional.  ?  ?Patient: Jacqueline Thornton  Service Category: E/M  Provider: Gaspar Cola, MD  ?DOB: April 06, 1951  DOS: 10/08/2021  Specialty: Interventional Pain Management  ?MRN: [id number]  Setting: Ambulatory outpatient  PCP: Perrin Maltese, MD  ?Type: Established Patient    Referring Provider: Perrin Maltese, MD  ?Location: Office  Delivery: Face-to-face    ? ?HPI  ?Ms. [name], a 71 y.o. year old female, is here today because of her Chronic pain syndrome [G89.4]. Ms. Bade's primary complain today is Back Pain ?Last encounter: My last encounter with her was on 07/09/2021. ?Pertinent problems: Ms. Wiens has Chronic pain syndrome; Headache, migraine; Chronic low back pain (2ry area of Pain) (Bilateral) (R>L) w/o sciatica; Spondylosis of lumbar spine; Lumbar annular disc tear (L4-5); Discogenic low back pain (L3-4 and L4-5); Lumbar facet hypertrophy; Lumbar facet syndrome (Bilateral) (R>L); Chronic neck pain (Left); Cervical spondylosis; Hx of cervical spine surgery; Cervical spinal fusion (C6-7 interbody fusion); Chronic sacroiliac joint pain (Bilateral) (L>R); Chronic lumbar radicular pain (S1 Dermatome) (1ry area of Pain) (Bilateral); Trochanteric bursitis of hip (Right); Chronic hip pain (Bilateral) (L>R); Neurogenic pain; Myofascial pain; Osteoarthritis of hip (Left); Arthrodesis status; DDD (degenerative disc disease), lumbar; Myalgia; Trochanteric bursitis of hip (Bilateral) (L>R); Spondylosis without myelopathy or radiculopathy, lumbosacral region; Spondylosis without  myelopathy or radiculopathy, sacral and sacrococcygeal region; Other specified dorsopathies, sacral and sacrococcygeal region; Age-related osteoporosis without current pathological fracture; Bone island of right femur; 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 Erline Levine) (12/11/2018); Failed back surgical syndrome; Unilateral occipital headache (Left); Cervicogenic headache (Left); Chronic hip pain (Left); Spondylolisthesis at L5-S1 level; Spondylosis of lumbosacral region without myelopathy or radiculopathy; Epidural fibrosis; Enthesopathy of hip region (Left); Chronic sacroiliac joint pain (Left); Enthesopathy of sacroiliac joint (Left); Somatic dysfunction of sacroiliac joint (Left); Herniated cervical disc without myelopathy; Chronic sacroiliac joint pain (Right); Osteoarthritis of sacroiliac joint (Right) ([place]); Somatic dysfunction of sacroiliac joint (Right); Chronic hip pain (Right); Neck pain; Herniated nucleus pulposus, C3-4; Prolapsed cervical intervertebral disc; Cervical spinal stenosis; and Radiculopathy, cervical region on their pertinent problem list. ?Pain Assessment: Severity of Chronic pain is reported as a 5 /10. Location: Back Lower/radiates into left buttock. Onset: More than a month ago. Quality: Aching. Timing: Intermittent. Modifying factor(s): heat, ice and meds. ?Vitals:  height is 5' (1.524 m) and weight is 142 lb (64.4 kg). Her temperature is 97.2 ?F (36.2 ?C) (abnormal). Her blood pressure is 141/87 (abnormal) and her pulse is 82. Her respiration is 18 and oxygen saturation is 99%.  ? ?Reason for encounter: medication management.   The patient indicates doing well with the current medication regimen. No adverse reactions or side effects reported to the medications.  ? ?RTCB: 01/11/2022 ?Nonopioids transferred  05/24/2020: Vitamin B12, Flexeril, and Neurontin. ? ?Pharmacotherapy Assessment  ?Analgesic: Oxycodone/APAP 7.5/325, 1 tab PO q 5 times a day. (56.25 mg/day of oxycodone) ?MME/day: 56.25 mg/day.  ? ?Monitoring: ?[place] PMP: PDMP reviewed during this encounter.       ?Pharmacotherapy: No side-effects or adverse reactions reported. ?Compliance:  No problems identified. ?Effectiveness: Clinically acceptable. ? ?Dewayne Shorter, RN  10/08/2021  1:58 PM  Sign when Signing Visit ?Nursing Pain Medication Assessment:  ?Safety precautions to be maintained throughout the outpatient stay will include: orient to surroundings, keep bed in low position, maintain call bell within reach at all times, provide assistance with transfer out of bed and ambulation.  ?Medication Inspection Compliance: Pill count conducted under aseptic conditions, in front of the patient. Neither the pills nor the bottle was removed from the patient's sight at any time. Once count was completed pills were immediately returned to the patient in their original bottle. ? ?Medication: Oxycodone/APAP ?Pill/Patch Count:  20 of 150 pills remain ?Pill/Patch Appearance: Markings consistent with prescribed medication ?Bottle Appearance: Standard pharmacy container. Clearly labeled. ?Filled Date: 03 / 16 / 2023 ?Last Medication intake:  Today ?   UDS:  ?Summary  ?Date Value Ref Range Status  ?12/13/2020 Note  Final  ?  Comment:  ?  ==================================================================== ?[employer] Select 13 (MW) ?==================================================================== ?Test                             Result       Flag       Units ? ?Drug Present and Declared for Prescription Verification ?  Oxycodone                      3279         EXPECTED   ng/mg creat ?  Oxymorphone                    691          EXPECTED   ng/mg creat ?  Noroxycodone                   6071         EXPECTED   ng/mg creat ?  Noroxymorphone                 305          EXPECTED    ng/mg creat ?   Sources of oxycodone are scheduled prescription medications. ?   Oxymorphone, noroxycodone, and noroxymorphone are expected ?   metabolites of oxycodone. Oxymorphone is also available as a ?   scheduled prescription medication. ? ?==================================================================== ?Test                      Result    Flag   Units      Ref Range ?  Creatinine              128              mg/dL      >=20 ?==================================================================== ?Declared Medications: ? The flagging and interpretation on this report are based on the ? following declared medications.  Unexpected results may arise from ? inaccuracies in the declared medications. ? ? **Note: The testing scope of this panel includes these medications: ? ? Oxycodone (Percocet) ? ? **Note: The testing scope of this panel does not include the ? following reported medications: ? ? Acetaminophen (Percocet) ? Bempedoic Acid (Nexletol) ? Calcium ? Duloxetine (Cymbalta) ? Enalapril (Vasotec) ? Esomeprazole (Nexium) ? Estradiol (Estrace) ? Furosemide (Lasix) ? Gabapentin (Neurontin) ? Melatonin ? Methocarbamol (Robaxin) ? Multivitamin ? Sertraline (Zoloft) ? Vitamin B12 ?==================================================================== ?For clinical consultation, please call [phone]. ?==================================================================== ?  ?  ? ?  ROS  ?Constitutional: Denies any fever or chills ?Gastrointestinal: No reported hemesis, hematochezia, vomiting, or acute GI distress ?Musculoskeletal: Denies any acute onset joint swelling, redness, loss of ROM, or weakness ?Neurological: No reported episodes of acute onset apraxia, aphasia, dysarthria, agnosia, amnesia, paralysis, loss of coordination, or loss of consciousness ? ?Medication Review  ?Calcium, DULoxetine, Melatonin, Vitamin B-12, atorvastatin, cyclobenzaprine, enalapril, estradiol, furosemide, gabapentin,  ibandronate, multivitamin with minerals, omeprazole, oxyCODONE-acetaminophen, and spironolactone ? ?History Review  ?Allergy: Ms. Meller has No Known Allergies. ?Drug: Ms. Girvan  reports no history of drug use. ?Alc

## 2021-10-08 NOTE — Progress Notes (Signed)
Nursing Pain Medication Assessment:  ?Safety precautions to be maintained throughout the outpatient stay will include: orient to surroundings, keep bed in low position, maintain call bell within reach at all times, provide assistance with transfer out of bed and ambulation.  ?Medication Inspection Compliance: Pill count conducted under aseptic conditions, in front of the patient. Neither the pills nor the bottle was removed from the patient's sight at any time. Once count was completed pills were immediately returned to the patient in their original bottle. ? ?Medication: Oxycodone/APAP ?Pill/Patch Count:  20 of 150 pills remain ?Pill/Patch Appearance: Markings consistent with prescribed medication ?Bottle Appearance: Standard pharmacy container. Clearly labeled. ?Filled Date: 03 / 16 / 2023 ?Last Medication intake:  Today ?

## 2021-10-08 NOTE — Patient Instructions (Signed)
____________________________________________________________________________________________ ? ?Medication Rules ? ?Purpose: To inform patients, and their family members, of our rules and regulations. ? ?Applies to: All patients receiving prescriptions (written or electronic). ? ?Pharmacy of record: Pharmacy where electronic prescriptions will be sent. If written prescriptions are taken to a different pharmacy, please inform the nursing staff. The pharmacy listed in the electronic medical record should be the one where you would like electronic prescriptions to be sent. ? ?Electronic prescriptions: In compliance with the [place] Strengthen Opioid Misuse Prevention (STOP) Act of 2017 (Session Law 2017-74/H243), effective July 01, 2018, all controlled substances must be electronically prescribed. Calling prescriptions to the pharmacy will cease to exist. ? ?Prescription refills: Only during scheduled appointments. Applies to all prescriptions. ? ?NOTE: The following applies primarily to controlled substances (Opioid* Pain Medications).  ? ?Type of encounter (visit): For patients receiving controlled substances, face-to-face visits are required. (Not an option or up to the patient.) ? ?Patient's responsibilities: ?Pain Pills: Bring all pain pills to every appointment (except for procedure appointments). ?Pill Bottles: Bring pills in original pharmacy bottle. Always bring the newest bottle. Bring bottle, even if empty. ?Medication refills: You are responsible for knowing and keeping track of what medications you take and those you need refilled. ?The day before your appointment: write a list of all prescriptions that need to be refilled. ?The day of the appointment: give the list to the admitting nurse. Prescriptions will be written only during appointments. No prescriptions will be written on procedure days. ?If you forget a medication: it will not be "Called in", "Faxed", or "electronically sent". You will  need to get another appointment to get these prescribed. ?No early refills. Do not call asking to have your prescription filled early. ?Prescription Accuracy: You are responsible for carefully inspecting your prescriptions before leaving our office. Have the discharge nurse carefully go over each prescription with you, before taking them home. Make sure that your name is accurately spelled, that your address is correct. Check the name and dose of your medication to make sure it is accurate. Check the number of pills, and the written instructions to make sure they are clear and accurate. Make sure that you are given enough medication to last until your next medication refill appointment. ?Taking Medication: Take medication as prescribed. When it comes to controlled substances, taking less pills or less frequently than prescribed is permitted and encouraged. ?Never take more pills than instructed. ?Never take medication more frequently than prescribed.  ?Inform other Doctors: Always inform, all of your healthcare providers, of all the medications you take. ?Pain Medication from other Providers: You are not allowed to accept any additional pain medication from any other Doctor or Healthcare provider. There are two exceptions to this rule. (see below) In the event that you require additional pain medication, you are responsible for notifying us, as stated below. ?Cough Medicine: Often these contain an opioid, such as codeine or hydrocodone. Never accept or take cough medicine containing these opioids if you are already taking an opioid* medication. The combination may cause respiratory failure and death. ?Medication Agreement: You are responsible for carefully reading and following our Medication Agreement. This must be signed before receiving any prescriptions from our practice. Safely store a copy of your signed Agreement. Violations to the Agreement will result in no further prescriptions. (Additional copies of our  Medication Agreement are available upon request.) ?Laws, Rules, & Regulations: All patients are expected to follow all Federal and State Laws, Statutes, Rules, & Regulations. Ignorance of   the Laws does not constitute a valid excuse.  ?Illegal drugs and Controlled Substances: The use of illegal substances (including, but not limited to marijuana and its derivatives) and/or the illegal use of any controlled substances is strictly prohibited. Violation of this rule may result in the immediate and permanent discontinuation of any and all prescriptions being written by our practice. The use of any illegal substances is prohibited. ?Adopted CDC guidelines & recommendations: Target dosing levels will be at or below 60 MME/day. Use of benzodiazepines** is not recommended. ? ?Exceptions: There are only two exceptions to the rule of not receiving pain medications from other Healthcare Providers. ?Exception #1 (Emergencies): In the event of an emergency (i.e.: accident requiring emergency care), you are allowed to receive additional pain medication. However, you are responsible for: As soon as you are able, call our office [phone], at any time of the day or night, and leave a message stating your name, the date and nature of the emergency, and the name and dose of the medication prescribed. In the event that your call is answered by a member of our staff, make sure to document and save the date, time, and the name of the person that took your information.  ?Exception #2 (Planned Surgery): In the event that you are scheduled by another doctor or dentist to have any type of surgery or procedure, you are allowed (for a period no longer than 30 days), to receive additional pain medication, for the acute post-op pain. However, in this case, you are responsible for picking up a copy of our "Post-op Pain Management for Surgeons" handout, and giving it to your surgeon or dentist. This document is available at our office, and  does not require an appointment to obtain it. Simply go to our office during business hours (Monday-Thursday from 8:00 AM to 4:00 PM) (Friday 8:00 AM to 12:00 Noon) or if you have a scheduled appointment with us, prior to your surgery, and ask for it by name. In addition, you are responsible for: calling our office [phone], at any time of the day or night, and leaving a message stating your name, name of your surgeon, type of surgery, and date of procedure or surgery. Failure to comply with your responsibilities may result in termination of therapy involving the controlled substances. ?Medication Agreement Violation. Following the above rules, including your responsibilities will help you in avoiding a Medication Agreement Violation (?Breaking your Pain Medication Contract?). ? ?*Opioid medications include: morphine, codeine, oxycodone, oxymorphone, hydrocodone, hydromorphone, meperidine, tramadol, tapentadol, buprenorphine, fentanyl, methadone. ?**Benzodiazepine medications include: diazepam (Valium), alprazolam (Xanax), clonazepam (Klonopine), lorazepam (Ativan), clorazepate (Tranxene), chlordiazepoxide (Librium), estazolam (Prosom), oxazepam (Serax), temazepam (Restoril), triazolam (Halcion) ?(Last updated: 03/28/2021) ?____________________________________________________________________________________________ ? ____________________________________________________________________________________________ ? ?Medication Recommendations and Reminders ? ?Applies to: All patients receiving prescriptions (written and/or electronic). ? ?Medication Rules & Regulations: These rules and regulations exist for your safety and that of others. They are not flexible and neither are we. Dismissing or ignoring them will be considered "non-compliance" with medication therapy, resulting in complete and irreversible termination of such therapy. (See document titled "Medication Rules" for more details.) In all conscience,  because of safety reasons, we cannot continue providing a therapy where the patient does not follow instructions. ? ?Pharmacy of record:  ?Definition: This is the pharmacy where your electronic prescriptions w

## 2021-12-11 ENCOUNTER — Encounter: Admission: RE | Payer: Self-pay | Source: Ambulatory Visit

## 2021-12-11 ENCOUNTER — Ambulatory Visit: Admission: RE | Admit: 2021-12-11 | Payer: Medicare Other | Source: Ambulatory Visit

## 2021-12-11 SURGERY — COLONOSCOPY WITH PROPOFOL
Anesthesia: General

## 2021-12-19 ENCOUNTER — Encounter: Payer: Self-pay | Admitting: Pain Medicine

## 2021-12-19 ENCOUNTER — Ambulatory Visit: Payer: Medicare Other | Attending: Pain Medicine | Admitting: Pain Medicine

## 2021-12-19 VITALS — BP 125/71 | HR 81 | Temp 96.9°F | Resp 16 | Ht 60.0 in | Wt 143.0 lb

## 2021-12-19 DIAGNOSIS — M48061 Spinal stenosis, lumbar region without neurogenic claudication: Secondary | ICD-10-CM | POA: Insufficient documentation

## 2021-12-19 DIAGNOSIS — G894 Chronic pain syndrome: Secondary | ICD-10-CM | POA: Insufficient documentation

## 2021-12-19 DIAGNOSIS — M5136 Other intervertebral disc degeneration, lumbar region: Secondary | ICD-10-CM | POA: Insufficient documentation

## 2021-12-19 DIAGNOSIS — M25551 Pain in right hip: Secondary | ICD-10-CM | POA: Diagnosis present

## 2021-12-19 DIAGNOSIS — Z79891 Long term (current) use of opiate analgesic: Secondary | ICD-10-CM | POA: Diagnosis present

## 2021-12-19 DIAGNOSIS — M5416 Radiculopathy, lumbar region: Secondary | ICD-10-CM | POA: Insufficient documentation

## 2021-12-19 DIAGNOSIS — Z79899 Other long term (current) drug therapy: Secondary | ICD-10-CM | POA: Diagnosis present

## 2021-12-19 DIAGNOSIS — M47816 Spondylosis without myelopathy or radiculopathy, lumbar region: Secondary | ICD-10-CM | POA: Insufficient documentation

## 2021-12-19 DIAGNOSIS — G8929 Other chronic pain: Secondary | ICD-10-CM | POA: Insufficient documentation

## 2021-12-19 DIAGNOSIS — M961 Postlaminectomy syndrome, not elsewhere classified: Secondary | ICD-10-CM | POA: Diagnosis present

## 2021-12-19 DIAGNOSIS — M25552 Pain in left hip: Secondary | ICD-10-CM | POA: Diagnosis present

## 2021-12-19 DIAGNOSIS — M542 Cervicalgia: Secondary | ICD-10-CM | POA: Insufficient documentation

## 2021-12-19 DIAGNOSIS — M48062 Spinal stenosis, lumbar region with neurogenic claudication: Secondary | ICD-10-CM | POA: Insufficient documentation

## 2021-12-19 DIAGNOSIS — M5442 Lumbago with sciatica, left side: Secondary | ICD-10-CM | POA: Diagnosis not present

## 2021-12-19 MED ORDER — OXYCODONE-ACETAMINOPHEN 7.5-325 MG PO TABS: 1.0000 | ORAL_TABLET | Freq: Every day | ORAL | 0 refills | Status: DC

## 2021-12-19 NOTE — Patient Instructions (Addendum)
______________________________________________________________________  Preparing for Procedure with Sedation  NOTICE: Due to recent regulatory changes, starting on January 29, 2021, procedures requiring intravenous (IV) sedation will no longer be performed at the [facility].  These types of procedures are required to be performed at [facility] ambulatory surgery facility.  We are very sorry for the inconvenience.  Procedure appointments are limited to planned procedures: No Prescription Refills. No disability issues will be discussed. No medication changes will be discussed.  Instructions: Oral Intake: Do not eat or drink anything for at least 8 hours prior to your procedure. (Exception: Blood Pressure Medication. See below.) Transportation: A driver is required. You may not drive yourself after the procedure. Blood Pressure Medicine: Do not forget to take your blood pressure medicine with a sip of water the morning of the procedure. If your Diastolic (lower reading) is above 100 mmHg, elective cases will be cancelled/rescheduled. Blood thinners: These will need to be stopped for procedures. Notify our staff if you are taking any blood thinners. Depending on which one you take, there will be specific instructions on how and when to stop it. Diabetics on insulin: Notify the staff so that you can be scheduled 1st case in the morning. If your diabetes requires high dose insulin, take only  of your normal insulin dose the morning of the procedure and notify the staff that you have done so. Preventing infections: Shower with an antibacterial soap the morning of your procedure. Build-up your immune system: Take 1000 mg of Vitamin C with every meal (3 times a day) the day prior to your procedure. Antibiotics: Inform the staff if you have a condition or reason that requires you to take antibiotics before dental procedures. Pregnancy: If you are pregnant, call and cancel the procedure. Sickness: If  you have a cold, fever, or any active infections, call and cancel the procedure. Arrival: You must be in the facility at least 30 minutes prior to your scheduled procedure. Children: Do not bring children with you. Dress appropriately: There is always the possibility that your clothing may get soiled. Valuables: Do not bring any jewelry or valuables.  Reasons to call and reschedule or cancel your procedure: (Following these recommendations will minimize the risk of a serious complication.) Surgeries: Avoid having procedures within 2 weeks of any surgery. (Avoid for 2 weeks before or after any surgery). Flu Shots: Avoid having procedures within 2 weeks of a flu shots. (Avoid for 2 weeks before or after immunizations). Barium: Avoid having a procedure within 7-10 days after having had a radiological study involving the use of radiological contrast. (Myelograms, Barium swallow or enema study). Heart attacks: Avoid any elective procedures or surgeries for the initial 6 months after a "Myocardial Infarction" (Heart Attack). Blood thinners: It is imperative that you stop these medications before procedures. Let us know if you if you take any blood thinner.  Infection: Avoid procedures during or within two weeks of an infection (including chest colds or gastrointestinal problems). Symptoms associated with infections include: Localized redness, fever, chills, night sweats or profuse sweating, burning sensation when voiding, cough, congestion, stuffiness, runny nose, sore throat, diarrhea, nausea, vomiting, cold or Flu symptoms, recent or current infections. It is specially important if the infection is over the area that we intend to treat. Heart and lung problems: Symptoms that may suggest an active cardiopulmonary problem include: cough, chest pain, breathing difficulties or shortness of breath, dizziness, ankle swelling, uncontrolled high or unusually low blood pressure, and/or palpitations. If you are    experiencing any of these symptoms, cancel your procedure and contact your primary care physician for an evaluation.  Remember:  Regular Business hours are:  Monday to Thursday 8:00 AM to 4:00 PM  Provider's Schedule: Francisco Naveira, MD:  Procedure days: Tuesday and Thursday 7:30 AM to 4:00 PM  Bilal Lateef, MD:  Procedure days: Monday and Wednesday 7:30 AM to 4:00 PM ______________________________________________________________________  ____________________________________________________________________________________________  General Risks and Possible Complications  Patient Responsibilities: It is important that you read this as it is part of your informed consent. It is our duty to inform you of the risks and possible complications associated with treatments offered to you. It is your responsibility as a patient to read this and to ask questions about anything that is not clear or that you believe was not covered in this document.  Patient's Rights: You have the right to refuse treatment. You also have the right to change your mind, even after initially having agreed to have the treatment done. However, under this last option, if you wait until the last second to change your mind, you may be charged for the materials used up to that point.  Introduction: Medicine is not an exact science. Everything in Medicine, including the lack of treatment(s), carries the potential for danger, harm, or loss (which is by definition: Risk). In Medicine, a complication is a secondary problem, condition, or disease that can aggravate an already existing one. All treatments carry the risk of possible complications. The fact that a side effects or complications occurs, does not imply that the treatment was conducted incorrectly. It must be clearly understood that these can happen even when everything is done following the highest safety standards.  No treatment: You can choose not to proceed with the  proposed treatment alternative. The "PRO(s)" would include: avoiding the risk of complications associated with the therapy. The "CON(s)" would include: not getting any of the treatment benefits. These benefits fall under one of three categories: diagnostic; therapeutic; and/or palliative. Diagnostic benefits include: getting information which can ultimately lead to improvement of the disease or symptom(s). Therapeutic benefits are those associated with the successful treatment of the disease. Finally, palliative benefits are those related to the decrease of the primary symptoms, without necessarily curing the condition (example: decreasing the pain from a flare-up of a chronic condition, such as incurable terminal cancer).  General Risks and Complications: These are associated to most interventional treatments. They can occur alone, or in combination. They fall under one of the following six (6) categories: no benefit or worsening of symptoms; bleeding; infection; nerve damage; allergic reactions; and/or death. No benefits or worsening of symptoms: In Medicine there are no guarantees, only probabilities. No healthcare provider can ever guarantee that a medical treatment will work, they can only state the probability that it may. Furthermore, there is always the possibility that the condition may worsen, either directly, or indirectly, as a consequence of the treatment. Bleeding: This is more common if the patient is taking a blood thinner, either prescription or over the counter (example: Goody Powders, Fish oil, Aspirin, Garlic, etc.), or if suffering a condition associated with impaired coagulation (example: Hemophilia, cirrhosis of the liver, low platelet counts, etc.). However, even if you do not have one on these, it can still happen. If you have any of these conditions, or take one of these drugs, make sure to notify your treating physician. Infection: This is more common in patients with a compromised  immune system, either due to disease (example:   diabetes, cancer, human immunodeficiency virus [HIV], etc.), or due to medications or treatments (example: therapies used to treat cancer and rheumatological diseases). However, even if you do not have one on these, it can still happen. If you have any of these conditions, or take one of these drugs, make sure to notify your treating physician. Nerve Damage: This is more common when the treatment is an invasive one, but it can also happen with the use of medications, such as those used in the treatment of cancer. The damage can occur to small secondary nerves, or to large primary ones, such as those in the spinal cord and brain. This damage may be temporary or permanent and it may lead to impairments that can range from temporary numbness to permanent paralysis and/or brain death. Allergic Reactions: Any time a substance or material comes in contact with our body, there is the possibility of an allergic reaction. These can range from a mild skin rash (contact dermatitis) to a severe systemic reaction (anaphylactic reaction), which can result in death. Death: In general, any medical intervention can result in death, most of the time due to an unforeseen complication. ____________________________________________________________________________________________ ____________________________________________________________________________________________  Medication Rules  Purpose: To inform patients, and their family members, of our rules and regulations.  Applies to: All patients receiving prescriptions (written or electronic).  Pharmacy of record: Pharmacy where electronic prescriptions will be sent. If written prescriptions are taken to a different pharmacy, please inform the nursing staff. The pharmacy listed in the electronic medical record should be the one where you would like electronic prescriptions to be sent.  Electronic prescriptions: In compliance  with the [place] Strengthen Opioid Misuse Prevention (STOP) Act of 2017 (Session Law 2017-74/H243), effective July 01, 2018, all controlled substances must be electronically prescribed. Calling prescriptions to the pharmacy will cease to exist.  Prescription refills: Only during scheduled appointments. Applies to all prescriptions.  NOTE: The following applies primarily to controlled substances (Opioid* Pain Medications).   Type of encounter (visit): For patients receiving controlled substances, face-to-face visits are required. (Not an option or up to the patient.)  Patient's responsibilities: Pain Pills: Bring all pain pills to every appointment (except for procedure appointments). Pill Bottles: Bring pills in original pharmacy bottle. Always bring the newest bottle. Bring bottle, even if empty. Medication refills: You are responsible for knowing and keeping track of what medications you take and those you need refilled. The day before your appointment: write a list of all prescriptions that need to be refilled. The day of the appointment: give the list to the admitting nurse. Prescriptions will be written only during appointments. No prescriptions will be written on procedure days. If you forget a medication: it will not be "Called in", "Faxed", or "electronically sent". You will need to get another appointment to get these prescribed. No early refills. Do not call asking to have your prescription filled early. Prescription Accuracy: You are responsible for carefully inspecting your prescriptions before leaving our office. Have the discharge nurse carefully go over each prescription with you, before taking them home. Make sure that your name is accurately spelled, that your address is correct. Check the name and dose of your medication to make sure it is accurate. Check the number of pills, and the written instructions to make sure they are clear and accurate. Make sure that you are given  enough medication to last until your next medication refill appointment. Taking Medication: Take medication as prescribed. When it comes to controlled substances, taking less   pills or less frequently than prescribed is permitted and encouraged. Never take more pills than instructed. Never take medication more frequently than prescribed.  Inform other Doctors: Always inform, all of your healthcare providers, of all the medications you take. Pain Medication from other Providers: You are not allowed to accept any additional pain medication from any other Doctor or Healthcare provider. There are two exceptions to this rule. (see below) In the event that you require additional pain medication, you are responsible for notifying us, as stated below. Cough Medicine: Often these contain an opioid, such as codeine or hydrocodone. Never accept or take cough medicine containing these opioids if you are already taking an opioid* medication. The combination may cause respiratory failure and death. Medication Agreement: You are responsible for carefully reading and following our Medication Agreement. This must be signed before receiving any prescriptions from our practice. Safely store a copy of your signed Agreement. Violations to the Agreement will result in no further prescriptions. (Additional copies of our Medication Agreement are available upon request.) Laws, Rules, & Regulations: All patients are expected to follow all Federal and State Laws, Statutes, Rules, & Regulations. Ignorance of the Laws does not constitute a valid excuse.  Illegal drugs and Controlled Substances: The use of illegal substances (including, but not limited to marijuana and its derivatives) and/or the illegal use of any controlled substances is strictly prohibited. Violation of this rule may result in the immediate and permanent discontinuation of any and all prescriptions being written by our practice. The use of any illegal substances is  prohibited. Adopted CDC guidelines & recommendations: Target dosing levels will be at or below 60 MME/day. Use of benzodiazepines** is not recommended.  Exceptions: There are only two exceptions to the rule of not receiving pain medications from other Healthcare Providers. Exception #1 (Emergencies): In the event of an emergency (i.e.: accident requiring emergency care), you are allowed to receive additional pain medication. However, you are responsible for: As soon as you are able, call our office [phone], at any time of the day or night, and leave a message stating your name, the date and nature of the emergency, and the name and dose of the medication prescribed. In the event that your call is answered by a member of our staff, make sure to document and save the date, time, and the name of the person that took your information.  Exception #2 (Planned Surgery): In the event that you are scheduled by another doctor or dentist to have any type of surgery or procedure, you are allowed (for a period no longer than 30 days), to receive additional pain medication, for the acute post-op pain. However, in this case, you are responsible for picking up a copy of our "Post-op Pain Management for Surgeons" handout, and giving it to your surgeon or dentist. This document is available at our office, and does not require an appointment to obtain it. Simply go to our office during business hours (Monday-Thursday from 8:00 AM to 4:00 PM) (Friday 8:00 AM to 12:00 Noon) or if you have a scheduled appointment with us, prior to your surgery, and ask for it by name. In addition, you are responsible for: calling our office [phone], at any time of the day or night, and leaving a message stating your name, name of your surgeon, type of surgery, and date of procedure or surgery. Failure to comply with your responsibilities may result in termination of therapy involving the controlled substances. Medication Agreement  Violation.   Following the above rules, including your responsibilities will help you in avoiding a Medication Agreement Violation ("Breaking your Pain Medication Contract").  *Opioid medications include: morphine, codeine, oxycodone, oxymorphone, hydrocodone, hydromorphone, meperidine, tramadol, tapentadol, buprenorphine, fentanyl, methadone. **Benzodiazepine medications include: diazepam (Valium), alprazolam (Xanax), clonazepam (Klonopine), lorazepam (Ativan), clorazepate (Tranxene), chlordiazepoxide (Librium), estazolam (Prosom), oxazepam (Serax), temazepam (Restoril), triazolam (Halcion) (Last updated: 03/28/2021) ____________________________________________________________________________________________  ____________________________________________________________________________________________  Medication Recommendations and Reminders  Applies to: All patients receiving prescriptions (written and/or electronic).  Medication Rules & Regulations: These rules and regulations exist for your safety and that of others. They are not flexible and neither are we. Dismissing or ignoring them will be considered "non-compliance" with medication therapy, resulting in complete and irreversible termination of such therapy. (See document titled "Medication Rules" for more details.) In all conscience, because of safety reasons, we cannot continue providing a therapy where the patient does not follow instructions.  Pharmacy of record:  Definition: This is the pharmacy where your electronic prescriptions will be sent.  We do not endorse any particular pharmacy, however, we have experienced problems with [facility] not securing enough medication supply for the community. We do not restrict you in your choice of pharmacy. However, once we write for your prescriptions, we will NOT be re-sending more prescriptions to fix restricted supply problems created by your pharmacy, or your insurance.  The pharmacy listed in  the electronic medical record should be the one where you want electronic prescriptions to be sent. If you choose to change pharmacy, simply notify our nursing staff.  Recommendations: Keep all of your pain medications in a safe place, under lock and key, even if you live alone. We will NOT replace lost, stolen, or damaged medication. After you fill your prescription, take 1 week's worth of pills and put them away in a safe place. You should keep a separate, properly labeled bottle for this purpose. The remainder should be kept in the original bottle. Use this as your primary supply, until it runs out. Once it's gone, then you know that you have 1 week's worth of medicine, and it is time to come in for a prescription refill. If you do this correctly, it is unlikely that you will ever run out of medicine. To make sure that the above recommendation works, it is very important that you make sure your medication refill appointments are scheduled at least 1 week before you run out of medicine. To do this in an effective manner, make sure that you do not leave the office without scheduling your next medication management appointment. Always ask the nursing staff to show you in your prescription , when your medication will be running out. Then arrange for the receptionist to get you a return appointment, at least 7 days before you run out of medicine. Do not wait until you have 1 or 2 pills left, to come in. This is very poor planning and does not take into consideration that we may need to cancel appointments due to bad weather, sickness, or emergencies affecting our staff. DO NOT ACCEPT A "Partial Fill": If for any reason your pharmacy does not have enough pills/tablets to completely fill or refill your prescription, do not allow for a "partial fill". The law allows the pharmacy to complete that prescription within 72 hours, without requiring a new prescription. If they do not fill the rest of your prescription  within those 72 hours, you will need a separate prescription to fill the remaining amount, which we will NOT provide. If  the reason for the partial fill is your insurance, you will need to talk to the pharmacist about payment alternatives for the remaining tablets, but again, DO NOT ACCEPT A PARTIAL FILL, unless you can trust your pharmacist to obtain the remainder of the pills within 72 hours.  Prescription refills and/or changes in medication(s):  Prescription refills, and/or changes in dose or medication, will be conducted only during scheduled medication management appointments. (Applies to both, written and electronic prescriptions.) No refills on procedure days. No medication will be changed or started on procedure days. No changes, adjustments, and/or refills will be conducted on a procedure day. Doing so will interfere with the diagnostic portion of the procedure. No phone refills. No medications will be "called into the pharmacy". No Fax refills. No weekend refills. No Holliday refills. No after hours refills.  Remember:  Business hours are:  Monday to Thursday 8:00 AM to 4:00 PM Provider's Schedule: Milinda Pointer, MD - Appointments are:  Medication management: Monday and Wednesday 8:00 AM to 4:00 PM Procedure day: Tuesday and Thursday 7:30 AM to 4:00 PM Gillis Santa, MD - Appointments are:  Medication management: Tuesday and Thursday 8:00 AM to 4:00 PM Procedure day: Monday and Wednesday 7:30 AM to 4:00 PM (Last update: 01/19/2020) ____________________________________________________________________________________________  ____________________________________________________________________________________________  CBD (cannabidiol) & Delta-8 (Delta-8 tetrahydrocannabinol) WARNING  Intro: Cannabidiol (CBD) and tetrahydrocannabinol (THC), are two natural compounds found in plants of the Cannabis genus. They can both be extracted from hemp or cannabis. Hemp and cannabis come  from the Cannabis sativa plant. Both compounds interact with your body's endocannabinoid system, but they have very different effects. CBD does not produce the high sensation associated with cannabis. Delta-8 tetrahydrocannabinol, also known as delta-8 THC, is a psychoactive substance found in the Cannabis sativa plant, of which marijuana and hemp are two varieties. THC is responsible for the high associated with the illicit use of marijuana.  Applicable to: All individuals currently taking or considering taking CBD (cannabidiol) and, more important, all patients taking opioid analgesic controlled substances (pain medication). (Example: oxycodone; oxymorphone; hydrocodone; hydromorphone; morphine; methadone; tramadol; tapentadol; fentanyl; buprenorphine; butorphanol; dextromethorphan; meperidine; codeine; etc.)  Legal status: CBD remains a Schedule I drug prohibited for any use. CBD is illegal with one exception. In the [place], CBD has a limited Transport planner (FDA) approval for the treatment of two specific types of epilepsy disorders. Only one CBD product has been approved by the FDA for this purpose: "Epidiolex". FDA is aware that some companies are marketing products containing cannabis and cannabis-derived compounds in ways that violate the [employer], Drug and Cosmetic Act [facility] Act) and that may put the health and safety of consumers at risk. The FDA, a Federal agency, has not enforced the CBD status since 2018. UPDATE: (08/17/2021) The Drug Enforcement Agency ([place]) issued a letter stating that "delta" cannabinoids, including Delta-8-THCO and Delta-9-THCO, synthetically derived from hemp do not qualify as hemp and will be viewed as Schedule I drugs. (Schedule I drugs, substances, or chemicals are defined as drugs with no currently accepted medical use and a high potential for abuse. Some examples of Schedule I drugs are: heroin, lysergic acid diethylamide (LSD), marijuana  (cannabis), 3,4-methylenedioxymethamphetamine (ecstasy), methaqualone, and peyote.) (https://jennings.com/)  Legality: Some manufacturers ship CBD products nationally, which is illegal. Often such products are sold online and are therefore available throughout the country. CBD is openly sold in head shops and health food stores in some states where such sales have not been explicitly legalized. Selling  unapproved products with unsubstantiated therapeutic claims is not only a violation of the law, but also can put patients at risk, as these products have not been proven to be safe or effective. Federal illegality makes it difficult to conduct research on CBD.  Reference: "FDA Regulation of Cannabis and Cannabis-Derived Products, Including Cannabidiol (CBD)" - https://www.fda.gov/news-events/public-health-focus/fda-regulation-cannabis-and-cannabis-derived-products-including-cannabidiol-cbd  Warning: CBD is not FDA approved and has not undergo the same manufacturing controls as prescription drugs.  This means that the purity and safety of available CBD may be questionable. Most of the time, despite manufacturer's claims, it is contaminated with THC (delta-9-tetrahydrocannabinol - the chemical in marijuana responsible for the "HIGH").  When this is the case, the THC contaminant will trigger a positive urine drug screen (UDS) test for Marijuana (carboxy-THC). Because a positive UDS for any illicit substance is a violation of our medication agreement, your opioid analgesics (pain medicine) may be permanently discontinued. The FDA recently put out a warning about 5 things that everyone should be aware of regarding Delta-8 THC: Delta-8 THC products have not been evaluated or approved by the FDA for safe use and may be marketed in ways that put the public health at risk. The FDA has received adverse event reports involving delta-8 THC-containing products. Delta-8 THC has psychoactive and intoxicating  effects. Delta-8 THC manufacturing often involve use of potentially harmful chemicals to create the concentrations of delta-8 THC claimed in the marketplace. The final delta-8 THC product may have potentially harmful by-products (contaminants) due to the chemicals used in the process. Manufacturing of delta-8 THC products may occur in uncontrolled or unsanitary settings, which may lead to the presence of unsafe contaminants or other potentially harmful substances. Delta-8 THC products should be kept out of the reach of children and pets.  MORE ABOUT CBD  General Information: CBD was discovered in 1940 and it is a derivative of the cannabis sativa genus plants (Marijuana and Hemp). It is one of the 113 identified substances found in Marijuana. It accounts for up to 40% of the plant's extract. As of 2018, preliminary clinical studies on CBD included research for the treatment of anxiety, movement disorders, and pain. CBD is available and consumed in multiple forms, including inhalation of smoke or vapor, as an aerosol spray, and by mouth. It may be supplied as an oil containing CBD, capsules, dried cannabis, or as a liquid solution. CBD is thought not to be as psychoactive as THC (delta-9-tetrahydrocannabinol - the chemical in marijuana responsible for the "HIGH"). Studies suggest that CBD may interact with different biological target receptors in the body, including cannabinoid and other neurotransmitter receptors. As of 2018 the mechanism of action for its biological effects has not been determined.  Side-effects  Adverse reactions: Dry mouth, diarrhea, decreased appetite, fatigue, drowsiness, malaise, weakness, sleep disturbances, and others.  Drug interactions: CBC may interact with other medications such as blood-thinners. Because CBD causes drowsiness on its own, it also increases the drowsiness caused by other medications, including antihistamines (such as Benadryl), benzodiazepines (Xanax, Ativan,  Valium), antipsychotics, antidepressants and opioids, as well as alcohol and supplements such as kava, melatonin and St. John's Wort. Be cautious with the following combinations:   Brivaracetam (Briviact) Brivaracetam is changed and broken down by the body. CBD might decrease how quickly the body breaks down brivaracetam. This might increase levels of brivaracetam in the body.  Caffeine Caffeine is changed and broken down by the body. CBD might decrease how quickly the body breaks down caffeine. This might increase levels of caffeine   in the body.  Carbamazepine (Tegretol) Carbamazepine is changed and broken down by the body. CBD might decrease how quickly the body breaks down carbamazepine. This might increase levels of carbamazepine in the body and increase its side effects.  Citalopram (Celexa) Citalopram is changed and broken down by the body. CBD might decrease how quickly the body breaks down citalopram. This might increase levels of citalopram in the body and increase its side effects.  Clobazam (Onfi) Clobazam is changed and broken down by the liver. CBD might decrease how quickly the liver breaks down clobazam. This might increase the effects and side effects of clobazam.  Eslicarbazepine (Aptiom) Eslicarbazepine is changed and broken down by the body. CBD might decrease how quickly the body breaks down eslicarbazepine. This might increase levels of eslicarbazepine in the body by a small amount.  Everolimus (Zostress) Everolimus is changed and broken down by the body. CBD might decrease how quickly the body breaks down everolimus. This might increase levels of everolimus in the body.  Lithium Taking higher doses of CBD might increase levels of lithium. This can increase the risk of lithium toxicity.  Medications changed by the liver (Cytochrome P450 1A1 (CYP1A1) substrates) Some medications are changed and broken down by the liver. CBD might change how quickly the liver breaks down  these medications. This could change the effects and side effects of these medications.  Medications changed by the liver (Cytochrome P450 1A2 (CYP1A2) substrates) Some medications are changed and broken down by the liver. CBD might change how quickly the liver breaks down these medications. This could change the effects and side effects of these medications.  Medications changed by the liver (Cytochrome P450 1B1 (CYP1B1) substrates) Some medications are changed and broken down by the liver. CBD might change how quickly the liver breaks down these medications. This could change the effects and side effects of these medications.  Medications changed by the liver (Cytochrome P450 2A6 (CYP2A6) substrates) Some medications are changed and broken down by the liver. CBD might change how quickly the liver breaks down these medications. This could change the effects and side effects of these medications.  Medications changed by the liver (Cytochrome P450 2B6 (CYP2B6) substrates) Some medications are changed and broken down by the liver. CBD might change how quickly the liver breaks down these medications. This could change the effects and side effects of these medications.  Medications changed by the liver (Cytochrome P450 2C19 (CYP2C19) substrates) Some medications are changed and broken down by the liver. CBD might change how quickly the liver breaks down these medications. This could change the effects and side effects of these medications.  Medications changed by the liver (Cytochrome P450 2C8 (CYP2C8) substrates) Some medications are changed and broken down by the liver. CBD might change how quickly the liver breaks down these medications. This could change the effects and side effects of these medications.  Medications changed by the liver (Cytochrome P450 2C9 (CYP2C9) substrates) Some medications are changed and broken down by the liver. CBD might change how quickly the liver breaks down these  medications. This could change the effects and side effects of these medications.  Medications changed by the liver (Cytochrome P450 2D6 (CYP2D6) substrates) Some medications are changed and broken down by the liver. CBD might change how quickly the liver breaks down these medications. This could change the effects and side effects of these medications.  Medications changed by the liver (Cytochrome P450 2E1 (CYP2E1) substrates) Some medications are changed and broken down  by the liver. CBD might change how quickly the liver breaks down these medications. This could change the effects and side effects of these medications.  Medications changed by the liver (Cytochrome P450 3A4 (CYP3A4) substrates) Some medications are changed and broken down by the liver. CBD might change how quickly the liver breaks down these medications. This could change the effects and side effects of these medications.  Medications changed by the liver (Glucuronidated drugs) Some medications are changed and broken down by the liver. CBD might change how quickly the liver breaks down these medications. This could change the effects and side effects of these medications.  Medications that decrease the breakdown of other medications by the liver (Cytochrome P450 2C19 (CYP2C19) inhibitors) CBD is changed and broken down by the liver. Some drugs decrease how quickly the liver changes and breaks down CBD. This could change the effects and side effects of CBD.  Medications that decrease the breakdown of other medications in the liver (Cytochrome P450 3A4 (CYP3A4) inhibitors) CBD is changed and broken down by the liver. Some drugs decrease how quickly the liver changes and breaks down CBD. This could change the effects and side effects of CBD.  Medications that increase breakdown of other medications by the liver (Cytochrome P450 3A4 (CYP3A4) inducers) CBD is changed and broken down by the liver. Some drugs increase how quickly the  liver changes and breaks down CBD. This could change the effects and side effects of CBD.  Medications that increase the breakdown of other medications by the liver (Cytochrome P450 2C19 (CYP2C19) inducers) CBD is changed and broken down by the liver. Some drugs increase how quickly the liver changes and breaks down CBD. This could change the effects and side effects of CBD.  Methadone (Dolophine) Methadone is broken down by the liver. CBD might decrease how quickly the liver breaks down methadone. Taking cannabidiol along with methadone might increase the effects and side effects of methadone.  Rufinamide (Banzel) Rufinamide is changed and broken down by the body. CBD might decrease how quickly the body breaks down rufinamide. This might increase levels of rufinamide in the body by a small amount.  Sedative medications (CNS depressants) CBD might cause sleepiness and slowed breathing. Some medications, called sedatives, can also cause sleepiness and slowed breathing. Taking CBD with sedative medications might cause breathing problems and/or too much sleepiness.  Sirolimus (Rapamune) Sirolimus is changed and broken down by the body. CBD might decrease how quickly the body breaks down sirolimus. This might increase levels of sirolimus in the body.  Stiripentol (Diacomit) Stiripentol is changed and broken down by the body. CBD might decrease how quickly the body breaks down stiripentol. This might increase levels of stiripentol in the body and increase its side effects.  Tacrolimus (Prograf) Tacrolimus is changed and broken down by the body. CBD might decrease how quickly the body breaks down tacrolimus. This might increase levels of tacrolimus in the body.  Tamoxifen (Soltamox) Tamoxifen is changed and broken down by the body. CBD might affect how quickly the body breaks down tamoxifen. This might affect levels of tamoxifen in the body.  Topiramate (Topamax) Topiramate is changed and broken  down by the body. CBD might decrease how quickly the body breaks down topiramate. This might increase levels of topiramate in the body by a small amount.  Valproate Valproic acid can cause liver injury. Taking cannabidiol with valproic acid might increase the chance of liver injury. CBD and/or valproic acid might need to be stopped, or  the dose might need to be reduced.  Warfarin (Coumadin) CBD might increase levels of warfarin, which can increase the risk for bleeding. CBD and/or warfarin might need to be stopped, or the dose might need to be reduced.  Zonisamide Zonisamide is changed and broken down by the body. CBD might decrease how quickly the body breaks down zonisamide. This might increase levels of zonisamide in the body by a small amount. (Last update: 08/29/2021) ____________________________________________________________________________________________  ____________________________________________________________________________________________  Drug Holidays (Slow)  What is a "Drug Holiday"? Drug Holiday: is the name given to the period of time during which a patient stops taking a medication(s) for the purpose of eliminating tolerance to the drug.  Benefits Improved effectiveness of opioids. Decreased opioid dose needed to achieve benefits. Improved pain with lesser dose.  What is tolerance? Tolerance: is the progressive decreased in effectiveness of a drug due to its repetitive use. With repetitive use, the body gets use to the medication and as a consequence, it loses its effectiveness. This is a common problem seen with opioid pain medications. As a result, a larger dose of the drug is needed to achieve the same effect that used to be obtained with a smaller dose.  How long should a "Drug Holiday" last? You should stay off of the pain medicine for at least 14 consecutive days. (2 weeks)  Should I stop the medicine "cold [place]"? No. You should always coordinate with  your Pain Specialist so that he/she can provide you with the correct medication dose to make the transition as smoothly as possible.  How do I stop the medicine? Slowly. You will be instructed to decrease the daily amount of pills that you take by one (1) pill every seven (7) days. This is called a "slow downward taper" of your dose. For example: if you normally take four (4) pills per day, you will be asked to drop this dose to three (3) pills per day for seven (7) days, then to two (2) pills per day for seven (7) days, then to one (1) per day for seven (7) days, and at the end of those last seven (7) days, this is when the "Drug Holiday" would start.   Will I have withdrawals? By doing a "slow downward taper" like this one, it is unlikely that you will experience any significant withdrawal symptoms. Typically, what triggers withdrawals is the sudden stop of a high dose opioid therapy. Withdrawals can usually be avoided by slowly decreasing the dose over a prolonged period of time. If you do not follow these instructions and decide to stop your medication abruptly, withdrawals may be possible.  What are withdrawals? Withdrawals: refers to the wide range of symptoms that occur after stopping or dramatically reducing opiate drugs after heavy and prolonged use. Withdrawal symptoms do not occur to patients that use low dose opioids, or those who take the medication sporadically. Contrary to benzodiazepine (example: Valium, Xanax, etc.) or alcohol withdrawals ("Delirium Tremens"), opioid withdrawals are not lethal. Withdrawals are the physical manifestation of the body getting rid of the excess receptors.  Expected Symptoms Early symptoms of withdrawal may include: Agitation Anxiety Muscle aches Increased tearing Insomnia Runny nose Sweating Yawning  Late symptoms of withdrawal may include: Abdominal cramping Diarrhea Dilated pupils Goose bumps Nausea Vomiting  Will I experience  withdrawals? Due to the slow nature of the taper, it is very unlikely that you will experience any.  What is a slow taper? Taper: refers to the gradual decrease in dose.  (  Last update: 01/19/2020) ____________________________________________________________________________________________   ____________________________________________________________________________________________  Pharmacy Shortages of Pain Medication   Introduction Shockingly as it may seem, .  "No U.[place] Supreme Court decision has ever interpreted the Constitution as guaranteeing a right to health care for all Americans." - https://huff.com/  "With respect to human rights, the [place] has no formally codified right to health, nor does it participate in a human rights treaty that specifies a right to health." - Scott J. Schweikart, JD, MBE  Situation By now, most of our patients have had the experience of being told by their pharmacist that they do not have enough medication to cover their prescription. If you have not had this experience, just know that you soon will.  Problem There appears to be a shortage of these medications, either at the national level or locally. This is happening with all pharmacies. When there is not enough medication, patients are offered a partial fill and they are told that they will try to get the rest of the medicine for them at a later time. If they do not have enough for even a partial fill, the pharmacists are telling the patients to call us (the prescribing physicians) to request that we send another prescription to another pharmacy to get the medicine.   This reordering of a controlled substance creates documentation problems where additional paperwork needs to be created to explain why two prescriptions for the same period of time and the same medicine are being prescribed to the same patient. It also creates  situations where the last appointment note does not accurately reflect when and what prescriptions were given to a patient. This leads to prescribing errors down the line, in subsequent follow-up visits.   [employer]acy ([employer]) Research revealed that Surveyor, quantity .1806 (21 NCAC 46.1806) authorizes pharmacists to the transfer of prescriptions among pharmacies, and it sets forth procedural and recordkeeping requirements for doing so. However, this requires the pharmacist to complete the previously mentioned procedural paperwork to accomplish the transfer. As it turns out, it is much easier for them to have the prescribing physicians do the work.   Possible solutions 1. Have the [facility] Assembly add a provision to the "STOP ACT" (the law that mandates how controlled substances are prescribed) where there is an exception to the electronic prescribing rule that states that in the event there are shortages of medications the physicians are allowed to use written prescriptions as opposed to electronic ones. This would allow patients to take their prescriptions to a different pharmacy that may have enough medication available to fill the prescription. The problem is that currently there is a law that does not allow for written prescriptions, with the exception of instances where the electronic medical record is down due to technical issues.  2. Have [place] Congress ease the pressure on pharmaceutical companies, allowing them to produce enough quantities of the medication to adequately supply the population. 3. Have pharmacies keep enough stocks of these medications to cover their client base.  4. Have the [facility] Assembly add a provision to the "STOP ACT" where they ease the regulations surrounding the transfer of controlled substances between pharmacies, so as to simplify the transfer of supplies. As an alternative, develop a system to allow patients to obtain the  remainder of their prescription at another one of their pharmacies or at an associate pharmacy.   How this shortage will affect you.  The one thing that is abundantly clear  is that this is a pharmacy supply problem  and not a prescriber problem. The job of the prescriber is to evaluate and monitor the patients for the appropriate indications to the use of these medicines, the monitoring of their use and the prescribing of the appropriate dose and regimen. It is not the job of the prescriber to provide or dispense the actual medication. By law, this is the job of the pharmacies and pharmacists. It is certainly not the job of the prescriber to solve the supply problems.   Due to the above problems we are no longer taking patients to write for their pain medication. We will continue to evaluate for appropriate indications and we may provide recommendations regarding medication, dose, and schedule, as well as monitoring recommendations, however, we will not be taking over the actual prescribing of these substances. On those patients where we are treating their chronic pain with interventional therapies, exceptions will be considered on a case by case basis. At this time, we will try to continue providing this supplemental service to those patients we have been managing in the past. However, as of August 1st, 2023, we no longer will be sending additional prescriptions to other pharmacies for the purpose of solving their supply problems. Once we send a prescription to a pharmacy, we will not be resending it again to another pharmacy to cover for their shortages.   What to do. Write as many letters as you can. Recruit the help of family members in writing these letters. Below are some of the places where you can write to make your voice heard. Let them know what the problem is and push them to look for solutions.   Search internet for: "[employer] find your  legislators" NoseSwap.is  Search internet for: "[employer] commissioner complaints" Starlas.fi  Search internet for: "[place] complaints" https://www.hernandez-brewer.com/.htm  Search internet for: "[facility] pharmacy complaints" Email [facility] Customer [facility] woondaal.com.jsp?callType=store  Search internet for: Programme researcher, broadcasting/film/video customer service complaints" https://www.[facility].com/topic/marketing/contactus/contactus_customerservice.jsp  ____________________________________________________________________________________________  Preparing for Procedure with Sedation Instructions: Oral Intake: Do not eat or drink anything for at least 8 hours prior to your procedure. Transportation: Public transportation is not allowed. Bring an adult driver. The driver must be physically present in our waiting room before any procedure can be started. Physical Assistance: Bring an adult capable of physically assisting you, in the event you need help. Blood Pressure Medicine: Take your blood pressure medicine with a sip of water the morning of the procedure. Insulin: Take only  of your normal insulin dose. Preventing infections: Shower with an antibacterial soap the morning of your procedure. Build-up your immune system: Take 1000 mg of Vitamin C with every meal (3 times a day) the day prior to your procedure. Pregnancy: If you are pregnant, call and cancel the procedure. Sickness: If you have a cold, fever, or any active infections, call and cancel the procedure. Arrival: You must be in the facility at least 30 minutes prior to your scheduled procedure. Children: Do not bring children with you. Dress appropriately: Bring dark clothing that you would not mind if they get stained. Valuables: Do not bring any jewelry or  valuables. Procedure appointments are reserved for interventional treatments only. No Prescription Refills. No medication changes will be discussed during procedure appointments. No disability issues will be discussed.

## 2021-12-19 NOTE — Progress Notes (Signed)
PROVIDER NOTE: Information contained herein reflects review and annotations entered in association with encounter. Interpretation of such information and data should be left to medically-trained personnel. Information provided to patient can be located elsewhere in the medical record under "Patient Instructions". Document created using STT-dictation technology, any transcriptional errors that may result from process are unintentional.    Patient: Jacqueline Thornton  Service Category: E/M  Provider: Gaspar Cola, MD  DOB: June 15, 1951  DOS: 12/19/2021  Specialty: Interventional Pain Management  MRN: [id number]  Setting: Ambulatory outpatient  PCP: Perrin Maltese, MD  Type: Established Patient    Referring Provider: Perrin Maltese, MD  Location: Office  Delivery: Face-to-face     HPI  Jacqueline Thornton, a 71 y.o. year old female, is here today because of her Chronic pain syndrome [G89.4]. Jacqueline Thornton's primary complain today is Back Pain (low) Last encounter: My last encounter with her was on 10/08/2021. Pertinent problems: Jacqueline Thornton has Chronic pain syndrome; Headache, migraine; Chronic low back pain (Bilateral) (R>L) w/o sciatica; Spondylosis of lumbar spine; Lumbar annular disc tear (L4-5); Discogenic low back pain (L3-4 and L4-5); Lumbar facet hypertrophy; Lumbar facet syndrome (Bilateral) (R>L); Chronic neck pain (Left); Cervical spondylosis; Hx of cervical spine surgery; Cervical spinal fusion (C6-7 interbody fusion); Chronic sacroiliac joint pain (Bilateral) (L>R); Chronic lumbar radicular pain (Left); Trochanteric bursitis of hip (Right); Chronic hip pain (Bilateral) (L>R); Neurogenic pain; Myofascial pain; Osteoarthritis of hip (Left); Arthrodesis status; DDD (degenerative disc disease), lumbar; Myalgia; Trochanteric bursitis of hip (Bilateral) (L>R); Spondylosis without myelopathy or radiculopathy, lumbosacral region; Spondylosis without myelopathy or radiculopathy, sacral and sacrococcygeal  region; Other specified dorsopathies, sacral and sacrococcygeal region; Age-related osteoporosis without current pathological fracture; Bone island of right femur; Inflammatory spondylopathy of lumbar region [facility]); Trigger point with back pain (Right); Fibromyalgia; DDD (degenerative disc disease), cervical; Spondylolisthesis of lumbar region; History of lumbar spinal fusion (Dr. Erline Levine) (12/11/2018); Failed back surgical syndrome; Unilateral occipital headache (Left); Cervicogenic headache (Left); Chronic hip pain (Left); Spondylolisthesis at L5-S1 level; Spondylosis of lumbosacral region without myelopathy or radiculopathy; Epidural fibrosis; Enthesopathy of hip region (Left); Chronic sacroiliac joint pain (Left); Enthesopathy of sacroiliac joint (Left); Somatic dysfunction of sacroiliac joint (Left); Herniated cervical disc without myelopathy; Chronic sacroiliac joint pain (Right); Osteoarthritis of sacroiliac joint (Right) ([place]); Somatic dysfunction of sacroiliac joint (Right); Chronic hip pain (Right); Neck pain; Herniated nucleus pulposus, C3-4; Prolapsed cervical intervertebral disc; Cervical spinal stenosis; Radiculopathy, cervical region; Spinal stenosis, lumbar region, 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 Pain Assessment: Severity of Chronic pain is reported as a 7 /10. Location: Back  /radiates into the back of left leg to mid calf. Onset: More than a month ago. Quality: Aching. Timing: Constant. Modifying factor(s): denies. Vitals:  height is 5' (1.524 m) and weight is 143 lb (64.9 kg). Her temperature is 96.9 F (36.1 C) (abnormal). Her blood pressure is 125/71 and her pulse is 81. Her respiration is 16 and oxygen saturation is 97%.   Reason for encounter: evaluation of worsening, or previously known (established) problem.  The patient indicates  having seen at the spine center and wanted me to go over her evaluation with them and recommendations.  Today I looked at "Care Everywhere" and I see where she was evaluated at the [facility].  However, after reading the note, I see no recommendations.  However, the note  seems to be partially incomplete.  The patient indicates doing well with the current medication regimen. No adverse reactions or side effects reported to the medications.   The patient came in today with notes from [facility], but again its not clear to me if they are requesting anything since it seems that they are requesting "L4-L5", but it does not say if its an interlaminar epidural, transforaminal epidurals, or blocking the L4-L5 facet joints.  There was also a report from what appears to be an MRI, which we were unable to reach secondary to the fact that the ink was very light and we could not make up what it said.  We had to call and request for them to resend it.  Today she describes her worst pain to be that of the lower back (Bilateral) (L>R) with the pain on the left side starting below the area of the PSIS and going all the way up to her scapula.  She refers that this pain then goes down to her tailbone and her anal region.  She describes her second worst pain is that of the left lower extremity where the pain runs down the leg through the lateral aspect and stops at the lateral aspect of her calf.  She denies any pain, numbness, or weakness into her foot.  She describes her third worst pain is that of the hips (Bilateral) (L>R).  The above combination would suggest that she is experiencing pain over the distribution of the L4 nerve roots, with the left side being worse than the right.  However she also has a component of central spinal stenosis pain.  Upon receiving and examining the results of her 12/06/2021 MRI, other than multilevel facet arthropathy, there is  a description of a severe bilateral facet arthropathy with ligamentum flavum infolding and severe spinal stenosis at the L4-5 level with mild bilateral foraminal stenosis.  She also has a prior anterior lumbar interbody fusion and posterior decompression at the L5-S1 level.  The MRI also describes a 2 mm retrolisthesis of L5 over S1 and a grade 1 anterolisthesis of L4 over L5.  With this information at hand, it is likely that what the were interested in having done was a bilateral L4-5 TFESI.  I will be scheduling the patient for this as soon as possible.  UDS ordered today.   RTCB: 04/11/2022 Nonopioids transferred 05/24/2020: Vitamin B12, Flexeril, and Neurontin.  Pharmacotherapy Assessment  Analgesic: Oxycodone/APAP 7.5/325, 1 tab PO q 5 times a day. (56.25 mg/day of oxycodone) MME/day: 56.25 mg/day.   Monitoring: [place] PMP: PDMP reviewed during this encounter.       Pharmacotherapy: No side-effects or adverse reactions reported. Compliance: No problems identified. Effectiveness: Clinically acceptable.  Dewayne Shorter, RN  12/19/2021 11:34 AM  Sign when Signing Visit Safety precautions to be maintained throughout the outpatient stay will include: orient to surroundings, keep bed in low position, maintain call bell within reach at all times, provide assistance with transfer out of bed and ambulation.    UDS:  Summary  Date Value Ref Range Status  12/13/2020 Note  Final    Comment:    ==================================================================== [employer] Select 13 (MW) ==================================================================== Test                             Result       Flag       Units  Drug Present and Declared for Prescription  Verification   Oxycodone                      3279         EXPECTED   ng/mg creat   Oxymorphone                    691          EXPECTED   ng/mg creat   Noroxycodone                   6071         EXPECTED   ng/mg creat   Noroxymorphone                  305          EXPECTED   ng/mg creat    Sources of oxycodone are scheduled prescription medications.    Oxymorphone, noroxycodone, and noroxymorphone are expected    metabolites of oxycodone. Oxymorphone is also available as a    scheduled prescription medication.  ==================================================================== Test                      Result    Flag   Units      Ref Range   Creatinine              128              mg/dL      >=20 ==================================================================== Declared Medications:  The flagging and interpretation on this report are based on the  following declared medications.  Unexpected results may arise from  inaccuracies in the declared medications.   **Note: The testing scope of this panel includes these medications:   Oxycodone (Percocet)   **Note: The testing scope of this panel does not include the  following reported medications:   Acetaminophen (Percocet)  Bempedoic Acid (Nexletol)  Calcium  Duloxetine (Cymbalta)  Enalapril (Vasotec)  Esomeprazole (Nexium)  Estradiol (Estrace)  Furosemide (Lasix)  Gabapentin (Neurontin)  Melatonin  Methocarbamol (Robaxin)  Multivitamin  Sertraline (Zoloft)  Vitamin B12 ==================================================================== For clinical consultation, please call [phone]. ====================================================================      ROS  Constitutional: Denies any fever or chills Gastrointestinal: No reported hemesis, hematochezia, vomiting, or acute GI distress Musculoskeletal: Denies any acute onset joint swelling, redness, loss of ROM, or weakness Neurological: No reported episodes of acute onset apraxia, aphasia, dysarthria, agnosia, amnesia, paralysis, loss of coordination, or loss of consciousness  Medication Review  Calcium, DULoxetine, Melatonin, Vitamin B-12, atorvastatin, cyclobenzaprine, enalapril, estradiol,  furosemide, gabapentin, ibandronate, multivitamin with minerals, omeprazole, oxyCODONE-acetaminophen, and spironolactone  History Review  Allergy: Jacqueline Thornton has No Known Allergies. Drug: Jacqueline Thornton  reports no history of drug use. Alcohol:  reports no history of alcohol use. Tobacco:  reports that she quit smoking about 18 years ago. Her smoking use included cigarettes. She has never used smokeless tobacco. Social: Ms. Barrantes  reports that she quit smoking about 18 years ago. Her smoking use included cigarettes. She has never used smokeless tobacco. She reports that she does not drink alcohol and does not use drugs. Medical:  has a past medical history of Absolute anemia (04/11/2015), Acute postoperative pain (08/07/2017), Angina pectoris ([place]), Anxiety, Arthritis, Arthropathy of sacroiliac joint (04/11/2015), Atypical face pain (04/24/2015), Back pain, CAD (coronary artery disease), Cancer ([place]), Chronic back pain, Constipation, Depression, Fibromyalgia, GERD (gastroesophageal reflux disease), 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) (04/11/2015), H/O: hysterectomy (1979), Heart murmur, Hyperlipidemia,  Hypertension, Low back pain (04/06/2015), Lumbar radicular pain (04/18/2015), Migraine, Narrowing of intervertebral disc space (04/11/2015), Sacroiliac joint pain (04/11/2015), and Spine disorder. Surgical: Jacqueline Thornton  has a past surgical history that includes Cholecystectomy; Abdominal hysterectomy; Neck surgery; Shoulder surgery; Colonoscopy with propofol (N/A, 07/19/2015); caract surger; Laparoscopic salpingo oophorectomy (Bilateral, 03/03/2018); Cystoscopy (03/03/2018); Tonsillectomy; Appendectomy; Tumor removal; Anterior lumbar fusion (N/A, 12/11/2018); Abdominal exposure (N/A, 12/11/2018); Anterior cervical decomp/discectomy fusion (N/A, 10/06/2020); Toe Fusion (Right); Esophagogastroduodenoscopy (egd) with propofol (N/A, 09/11/2021); and Colonoscopy with propofol (N/A, 09/11/2021). Family: family  history includes Colon cancer in her maternal grandmother and mother; Diabetes in her father and mother; Hypertension in her sister; Stroke in her mother.  Laboratory Chemistry Profile   Renal Lab Results  Component Value Date   BUN 8 10/06/2020   CREATININE 0.90 06/20/2021   BCR 8 (L) 12/16/2019   GFRAA 47 (L) 12/16/2019   GFRNONAA >60 10/06/2020    Hepatic Lab Results  Component Value Date   AST 15 12/16/2019   ALT 14 03/08/2018   ALBUMIN 4.0 12/16/2019   ALKPHOS 60 12/16/2019    Electrolytes Lab Results  Component Value Date   NA 137 10/06/2020   K 3.6 10/06/2020   CL 105 10/06/2020   CALCIUM 8.6 (L) 10/06/2020   MG 2.4 (H) 12/16/2019    Bone Lab Results  Component Value Date   25OHVITD1 53 12/16/2019   25OHVITD2 <1.0 12/16/2019   25OHVITD3 53 12/16/2019    Inflammation (CRP: Acute Phase) (ESR: Chronic Phase) Lab Results  Component Value Date   CRP 4 12/16/2019   ESRSEDRATE 6 12/16/2019         Note: Above Lab results reviewed.  Recent Imaging Review  CT ABDOMEN PELVIS W CONTRAST CLINICAL DATA:  Periumbilical pain. Has developed right calf pain over the past week. Upon awakening this morning, she had swelling in the right AND left foot and ankle mostly left. History of DVT. Not on blood thinners  EXAM: CT ABDOMEN AND PELVIS WITH CONTRAST  TECHNIQUE: Multidetector CT imaging of the abdomen and pelvis was performed using the standard protocol following bolus administration of intravenous contrast.  CONTRAST:  22m OMNIPAQUE IOHEXOL 300 MG/ML  SOLN  COMPARISON:  None.  FINDINGS: Lower chest: No acute abnormality.  Hepatobiliary: No focal liver abnormality. No gallstones, gallbladder wall thickening, or pericholecystic fluid. No biliary dilatation.  Pancreas: Diffusely atrophic. No focal lesion. Otherwise normal pancreatic contour. No surrounding inflammatory changes. No main pancreatic ductal dilatation.  Spleen: Normal in size without focal  abnormality.  Adrenals/Urinary Tract:  No adrenal nodule bilaterally.  Bilateral kidneys enhance symmetrically.  No hydronephrosis. No hydroureter.  The urinary bladder is unremarkable.  On delayed imaging, there is no urothelial wall thickening and there are no filling defects in the opacified portions of the bilateral collecting systems or ureters.  Stomach/Bowel: Stomach is within normal limits. No evidence of bowel wall thickening or dilatation. Intramural fat infiltration along the ascending colon suggestive of chronic inflammatory changes. Stool throughout the colon. Status post appendectomy.  Vascular/Lymphatic: No abdominal aorta or iliac aneurysm. Moderate to severe atherosclerotic plaque of the aorta and its branches. No abdominal, pelvic, or inguinal lymphadenopathy.  Reproductive: Status post hysterectomy. No adnexal masses.  Other: No intraperitoneal free fluid. No intraperitoneal free gas. No organized fluid collection.  Musculoskeletal:  No abdominal wall hernia or abnormality.  No suspicious lytic or blastic osseous lesions. No acute displaced fracture. L5-S1 surgical hardware. Multilevel degenerative changes of the spine.  IMPRESSION: 1. No acute intra-abdominal  or intrapelvic abnormality. 2. Diffusely atrophic pancreas. 3.  Aortic Atherosclerosis (ICD10-I70.0).  Electronically Signed   By: Iven Finn M.D.   On: 06/20/2021 17:40 Note: Reviewed        Physical Exam  General appearance: Well nourished, well developed, and well hydrated. In no apparent acute distress Mental status: Alert, oriented x 3 (person, place, & time)       Respiratory: No evidence of acute respiratory distress Eyes: PERLA Vitals: BP 125/71   Pulse 81   Temp (!) 96.9 F (36.1 C)   Resp 16   Ht 5' (1.524 m)   Wt 143 lb (64.9 kg)   SpO2 97%   BMI 27.93 kg/m  BMI: Estimated body mass index is 27.93 kg/m as calculated from the following:   Height as of this  encounter: 5' (1.524 m).   Weight as of this encounter: 143 lb (64.9 kg). Ideal: Ideal body weight: 45.5 kg (100 lb 4.9 oz) Adjusted ideal body weight: 53.2 kg (117 lb 6.2 oz)  Assessment   Diagnosis Status  1. Chronic pain syndrome   2. Chronic low back pain (Bilateral) w/ sciatica (Left)   3. 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 Lumbar foraminal stenosis (Bilateral: L4-5)   5. Chronic lumbar radicular pain (Left)   6. Failed back surgical syndrome   7. DDD (degenerative disc disease), lumbar   8. Lumbar facet syndrome (Bilateral) (R>L)   9. Chronic hip pain (Bilateral) (L>R)   10. Chronic neck pain (Left)   11. Pharmacologic therapy   12. Chronic use of opiate for therapeutic purpose   13. Encounter for medication management   14. 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  Jacqueline Thornton has a current medication list which includes the following long-term medication(s): atorvastatin, calcium, vitamin b-12, duloxetine, enalapril, furosemide, gabapentin, omeprazole, [START ON 01/11/2022] oxycodone-acetaminophen, [START ON 02/10/2022] oxycodone-acetaminophen, [START ON 03/12/2022] oxycodone-acetaminophen, and spironolactone.  Pharmacotherapy (Medications Ordered): Meds ordered this encounter  Medications   oxyCODONE-acetaminophen  (PERCOCET) 7.5-325 MG tablet    Sig: Take 1 tablet by mouth 5 (five) times daily. Must last 30 days    Dispense:  150 tablet    Refill:  0    DO NOT: delete (not duplicate); no partial-fill (will deny script to complete), no refill request (F/U required). DISPENSE: 1 day early if closed on fill date. WARN: No CNS-depressants within 8 hrs of med.   oxyCODONE-acetaminophen (PERCOCET) 7.5-325 MG tablet    Sig: Take 1 tablet by mouth 5 (five) times daily. Must last 30 days    Dispense:  150 tablet    Refill:  0    DO NOT: delete (not duplicate); no partial-fill (will deny script to complete), no refill request (F/U required). DISPENSE: 1 day early if closed on fill date. WARN: No CNS-depressants within 8 hrs of med.   oxyCODONE-acetaminophen (PERCOCET) 7.5-325 MG tablet  Sig: Take 1 tablet by mouth 5 (five) times daily. Must last 30 days    Dispense:  150 tablet    Refill:  0    DO NOT: delete (not duplicate); no partial-fill (will deny script to complete), no refill request (F/U required). DISPENSE: 1 day early if closed on fill date. WARN: No CNS-depressants within 8 hrs of med.   Orders:  Orders Placed This Encounter  Procedures   Lumbar Transforaminal Epidural    Standing Status:   Future    Standing Expiration Date:   03/21/2022    Scheduling Instructions:     Laterality: Bilateral     Level(s): L4 & L5     Sedation: Patient's choice.     Timeframe: ASAA    Order Specific Question:   Where will this procedure be performed?    Answer:   [facility]   [employer] Select 13 (MW), Urine    Volume: 30 ml(s). Minimum 3 ml of urine is needed. Document temperature of fresh sample. Indications: Long term (current) use of opiate analgesic (S49.675)    Order Specific Question:   Release to patient    Answer:   Immediate   Follow-up plan:   Return for (ECT) procedure: (B) L4 TFESI #1.     Interventional Therapies  Risk  Complexity Considerations:   Estimated body mass index is  28.32 kg/m as calculated from the following:   Height as of this encounter: 5' (1.524 m).   Weight as of this encounter: 145 lb (65.8 kg). WNL   Planned  Pending:   (L) L-FCT RFA   Under consideration:   Diagnostic caudal diagnostic epidurogram Diagnostic CESI  Diagnostic bilateral cervical facet block  Possible bilateral cervical facet RFA  Diagnostic left L4-5 LESI  Diagnostic left S1 SNRB    Completed:   Therapeutic right Racz procedure x1 (08/17/2020) (04/09/84/85) (100% relief of the right LEP) Diagnostic caudal ESI x1 (07/11/2020) (100/100/0/0)  Palliative left L5-S1 LESI x2 (07/27/2015) (70/70/0/0)  Diagnostic left lumbar facet MBB x6 (05/14/2018) (n/a)  Palliative right lumbar facet MBB x9 (06/13/2020) (100/100/30 x3 days/>50)  Therapeutic right shoulder joint injection x1 (02/29/2016) (90/80/30)  Therapeutic left IA hip injection x3 (06/13/2020) (100/100/30/>50)  Diagnostic right SI joint block x3 (06/17/2017) (100/90/80/80)  Diagnostic/therapeutic left SI joint block x4 (06/17/2017) (100/90/80/80)  Palliative bilateral trochanteric bursa injection x1 (12/11/2016) (100/100/90/90)  Therapeutic right lumbar facet RFA x2 (01/11/2021) (100/100/70)  Therapeutic left lumbar facet RFA x2 (12/19/2020) (100/100/75/>75) Therapeutic right SI joint RFA x1 (08/07/2017) (100/100/80)  Therapeutic left SI joint RFA x1 (09/16/2017) (100/100/100)    Therapeutic  Palliative (PRN) options:   Palliative Racz procedure  Palliative bilateral lumbar facet block  Palliative bilateral SI joint block  Palliative right trochanteric bursa injection     Recent Visits Date Type Provider Dept  10/08/21 Office Visit Milinda Pointer, MD [facility]  Showing recent visits within past 90 days and meeting all other requirements Today's Visits Date Type Provider Dept  12/19/21 Office Visit Milinda Pointer, MD [facility]  Showing today's visits and meeting all other  requirements Future Appointments Date Type Provider Dept  01/02/22 Appointment Milinda Pointer, MD [facility]  Showing future appointments within next 90 days and meeting all other requirements  I discussed the assessment and treatment plan with the patient. The patient was provided an opportunity to ask questions and all were answered. The patient agreed with the plan and demonstrated an understanding of the instructions.  Patient advised  to call back or seek an in-person evaluation if the symptoms or condition worsens.  Duration of encounter: 35 minutes.  Note by: Gaspar Cola, MD Date: 12/19/2021; Time: 12:20 PM

## 2021-12-19 NOTE — Progress Notes (Signed)
Safety precautions to be maintained throughout the outpatient stay will include: orient to surroundings, keep bed in low position, maintain call bell within reach at all times, provide assistance with transfer out of bed and ambulation.  

## 2021-12-22 LAB — TOXASSURE SELECT 13 (MW), URINE

## 2022-01-02 ENCOUNTER — Encounter: Payer: Medicare Other | Admitting: Pain Medicine

## 2022-01-06 NOTE — Progress Notes (Signed)
PROVIDER NOTE: Interpretation of information contained herein should be left to medically-trained personnel. Specific patient instructions are provided elsewhere under "Patient Instructions" section of medical record. This document was created in part using STT-dictation technology, any transcriptional errors that may result from this process are unintentional.  Patient: Jacqueline Thornton Type: Established DOB: 04-Aug-1950 MRN: [id number] PCP: Jacqueline Loveless, MD  Service: Procedure DOS: 01/08/2022 Setting: Ambulatory Location: Ambulatory outpatient facility Delivery: Face-to-face Provider: Oswaldo Done, MD Specialty: Interventional Pain Management Specialty designation: 09 Location: Outpatient facility Ref. Prov.: Jacqueline Loveless, MD    Primary Reason for Visit: Interventional Pain Management Treatment. CC: Back Pain (Bilateral lumbar )   Procedure:           Type: Lumbar trans-foraminal epidural steroid injection (L-TFESI) #1  Laterality: Bilateral (-50)  Level: L4 nerve root(s) Imaging: Fluoroscopy-guided Anesthesia: Local anesthesia (1-2% Lidocaine) Anxiolysis: IV Versed 2.0 mg Sedation: Moderate conscious sedation. DOS: 01/08/2022  Performed by: Oswaldo Done, MD  Purpose: Diagnostic/Therapeutic Indications: Lumbar radicular pain severe enough to impact quality of life or function. 1. 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 Discogenic low back pain (L3-4 and L4-5)   3. Lumbar annular disc tear (L4-5)   4. 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 Chronic lumbar radicular pain (Left)   6. DDD (degenerative disc disease), lumbar   7. Lumbar facet hypertrophy   8. Epidural fibrosis   9. Failed back surgical syndrome   10. Spondylolisthesis at L5-S1 level   11. History of lumbar spinal fusion (Dr. Maeola Thornton) (12/11/2018)   12. Inflammatory spondylopathy of lumbar region (HCC)   13. Abnormal MRI, lumbar spine & Sacrum (12/26/2017)     NAS-11 Pain score:   Pre-procedure: 8 /10   Post-procedure: 2 /10     Position / Prep / Materials:  Position: Prone  Prep solution: DuraPrep (Iodine Povacrylex [0.7% available iodine] and Isopropyl Alcohol, 74% w/w) Prep Area: Entire Posterior Lumbosacral Area.  From the lower tip of the scapula down to the tailbone and from flank to flank. Materials:  Tray: Block Needle(s):  Type: Spinal  Gauge (G): 22  Length: 5-in  Qty: 2  Pre-op H&P Assessment:  [name] is a 71 y.o. (year old), female patient, seen today for interventional treatment. She  has a past surgical history that includes Cholecystectomy; Abdominal hysterectomy; Neck surgery; Shoulder surgery; Colonoscopy with propofol (N/A, 07/19/2015); caract surger; Laparoscopic salpingo oophorectomy (Bilateral, 03/03/2018); Cystoscopy (03/03/2018); Tonsillectomy; Appendectomy; Tumor removal; Anterior lumbar fusion (N/A, 12/11/2018); Abdominal exposure (N/A, 12/11/2018); Anterior cervical decomp/discectomy fusion (N/A, 10/06/2020); Toe Fusion (Right); Esophagogastroduodenoscopy (egd) with propofol (N/A, 09/11/2021); and Colonoscopy with propofol (N/A, 09/11/2021). [name] has a current medication list which includes the following prescription(s): atorvastatin, calcium, vitamin b-12, cyclobenzaprine, duloxetine, enalapril, estradiol, furosemide, gabapentin, ibandronate, melatonin, meloxicam, multivitamin with minerals, omeprazole, [START ON 01/11/2022] oxycodone-acetaminophen, [START ON 02/10/2022] oxycodone-acetaminophen, [START ON 03/12/2022] oxycodone-acetaminophen, rosuvastatin, and spironolactone, and the following Facility-Administered Medications: fentanyl and pentafluoroprop-tetrafluoroeth. Her primarily concern today is the Back Pain (Bilateral lumbar )  Initial Vital Signs:  Pulse/HCG Rate: 76ECG Heart Rate: 77 (NSR) Temp: (!) 97.2 F (36.2 C) Resp: 16 BP: (!) 100/59 SpO2: 100 %  BMI: Estimated body mass index is 27.93 kg/m  as calculated from the following:   Height as of this encounter: 5' (1.524 m).   Weight as of this encounter: 143 lb (64.9 kg).  Risk Assessment: Allergies: Reviewed. She has No Known Allergies.  Allergy Precautions: None required Coagulopathies:  Reviewed. None identified.  Blood-thinner therapy: None at this time Active Infection(s): Reviewed. None identified. [name] is afebrile  Site Confirmation: [name] was asked to confirm the procedure and laterality before marking the site Procedure checklist: Completed Consent: Before the procedure and under the influence of no sedative(s), amnesic(s), or anxiolytics, the patient was informed of the treatment options, risks and possible complications. To fulfill our ethical and legal obligations, as recommended by the American Medical Association's Code of Ethics, I have informed the patient of my clinical impression; the nature and purpose of the treatment or procedure; the risks, benefits, and possible complications of the intervention; the alternatives, including doing nothing; the risk(s) and benefit(s) of the alternative treatment(s) or procedure(s); and the risk(s) and benefit(s) of doing nothing. The patient was provided information about the general risks and possible complications associated with the procedure. These may include, but are not limited to: failure to achieve desired goals, infection, bleeding, organ or nerve damage, allergic reactions, paralysis, and death. In addition, the patient was informed of those risks and complications associated to Spine-related procedures, such as failure to decrease pain; infection (i.e.: Meningitis, epidural or intraspinal abscess); bleeding (i.e.: epidural hematoma, subarachnoid hemorrhage, or any other type of intraspinal or peri-dural bleeding); organ or nerve damage (i.e.: Any type of peripheral nerve, nerve root, or spinal cord injury) with subsequent damage to sensory, motor, and/or autonomic  systems, resulting in permanent pain, numbness, and/or weakness of one or several areas of the body; allergic reactions; (i.e.: anaphylactic reaction); and/or death. Furthermore, the patient was informed of those risks and complications associated with the medications. These include, but are not limited to: allergic reactions (i.e.: anaphylactic or anaphylactoid reaction(s)); adrenal axis suppression; blood sugar elevation that in diabetics may result in ketoacidosis or comma; water retention that in patients with history of congestive heart failure may result in shortness of breath, pulmonary edema, and decompensation with resultant heart failure; weight gain; swelling or edema; medication-induced neural toxicity; particulate matter embolism and blood vessel occlusion with resultant organ, and/or nervous system infarction; and/or aseptic necrosis of one or more joints. Finally, the patient was informed that Medicine is not an exact science; therefore, there is also the possibility of unforeseen or unpredictable risks and/or possible complications that may result in a catastrophic outcome. The patient indicated having understood very clearly. We have given the patient no guarantees and we have made no promises. Enough time was given to the patient to ask questions, all of which were answered to the patient's satisfaction. Ms. Silveria has indicated that she wanted to continue with the procedure. Attestation: I, the ordering provider, attest that I have discussed with the patient the benefits, risks, side-effects, alternatives, likelihood of achieving goals, and potential problems during recovery for the procedure that I have provided informed consent. Date  Time: 01/08/2022  8:41 AM  Pre-Procedure Preparation:  Monitoring: As per clinic protocol. Respiration, ETCO2, SpO2, BP, heart rate and rhythm monitor placed and checked for adequate function Safety Precautions: Patient was assessed for positional comfort  and pressure points before starting the procedure. Time-out: I initiated and conducted the "Time-out" before starting the procedure, as per protocol. The patient was asked to participate by confirming the accuracy of the "Time Out" information. Verification of the correct person, site, and procedure were performed and confirmed by me, the nursing staff, and the patient. "Time-out" conducted as per Joint Commission's Universal Protocol (UP.01.01.01). Time: 0916  Description/Narrative of Procedure:  Target: The 6 o'clock position under the pedicle, on the affected side. Region: Posterolateral Lumbosacral Approach: Posterior Percutaneous Paravertebral approach.  Rationale (medical necessity): procedure needed and proper for the diagnosis and/or treatment of the patient's medical symptoms and needs. Procedural Technique Safety Precautions: Aspiration looking for blood return was conducted prior to all injections. At no point did we inject any substances, as a needle was being advanced. No attempts were made at seeking any paresthesias. Safe injection practices and needle disposal techniques used. Medications properly checked for expiration dates. SDV (single dose vial) medications used. Description of the Procedure: Protocol guidelines were followed. The patient was placed in position over the procedure table. The target area was identified and the area prepped in the usual manner. Skin & deeper tissues infiltrated with local anesthetic. Appropriate amount of time allowed to pass for local anesthetics to take effect. The procedure needles were then advanced to the target area. Proper needle placement secured. Negative aspiration confirmed. Solution injected in intermittent fashion, asking for systemic symptoms every 0.5cc of injectate. The needles were then removed and the area cleansed, making sure to leave some of the prepping solution back to take advantage of its long term bactericidal  properties.  Vitals:   01/08/22 0933 01/08/22 0945 01/08/22 0955 01/08/22 1003  BP: (!) 102/55 (!) 95/45 99/82 104/87  Pulse:      Resp: 20 20 18 16   Temp:  (!) 97.2 F (36.2 C)  (!) 97.3 F (36.3 C)  TempSrc:      SpO2: 100% 97% 100% 100%  Weight:      Height:        Start Time: 0916 hrs. End Time:   hrs.  Imaging Guidance (Spinal):          Type of Imaging Technique: Fluoroscopy Guidance (Spinal) Indication(s): Assistance in needle guidance and placement for procedures requiring needle placement in or near specific anatomical locations not easily accessible without such assistance. Exposure Time: Please see nurses notes. Contrast: Before injecting any contrast, we confirmed that the patient did not have an allergy to iodine, shellfish, or radiological contrast. Once satisfactory needle placement was completed at the desired level, radiological contrast was injected. Contrast injected under live fluoroscopy. No contrast complications. See chart for type and volume of contrast used. Fluoroscopic Guidance: I was personally present during the use of fluoroscopy. "Tunnel Vision Technique" used to obtain the best possible view of the target area. Parallax error corrected before commencing the procedure. "Direction-depth-direction" technique used to introduce the needle under continuous pulsed fluoroscopy. Once target was reached, antero-posterior, oblique, and lateral fluoroscopic projection used confirm needle placement in all planes. Images permanently stored in EMR. Interpretation: I personally interpreted the imaging intraoperatively. Adequate needle placement confirmed in multiple planes. Appropriate spread of contrast into desired area was observed. No evidence of afferent or efferent intravascular uptake. No intrathecal or subarachnoid spread observed. Permanent images saved into the patient's record.  Antibiotic Prophylaxis:   Anti-infectives (From admission, onward)    None       Indication(s): None identified  Post-operative Assessment:  Post-procedure Vital Signs:  Pulse/HCG Rate: 7678 Temp: (!) 97.3 F (36.3 C) Resp: 16 BP: 104/87 SpO2: 100 %  EBL: None  Complications: No immediate post-treatment complications observed by team, or reported by patient.  Note: The patient tolerated the entire procedure well. A repeat set of vitals were taken after the procedure and the patient was kept under observation following institutional policy, for this type of procedure. Post-procedural neurological assessment  was performed, showing return to baseline, prior to discharge. The patient was provided with post-procedure discharge instructions, including a section on how to identify potential problems. Should any problems arise concerning this procedure, the patient was given instructions to immediately contact us, at any time, without hesitation. In any case, we plan to contact the patient by telephone for a follow-up status report regarding this interventional procedure.  Comments:  No additional relevant information.  Plan of Care  Orders:  Orders Placed This Encounter  Procedures   Lumbar Transforaminal Epidural    Scheduling Instructions:     Laterality: Bilateral     Level(s): L4             Sedation: With Sedation.     Timeframe: Today    Order Specific Question:   Where will this procedure be performed?    Answer:   [facility]   [facility] C-ARM 1-60 MIN NO REPORT    Intraoperative interpretation by procedural physician at [facility].    Standing Status:   Standing    Number of Occurrences:   1    Order Specific Question:   Reason for exam:    Answer:   Assistance in needle guidance and placement for procedures requiring needle placement in or near specific anatomical locations not easily accessible without such assistance.   Informed Consent Details: Physician/Practitioner Attestation; Transcribe to consent form and obtain  patient signature    Provider Attestation: I, Kellis Mcadam A. Laban Emperor, MD, (Pain Management Specialist), the physician/practitioner, attest that I have discussed with the patient the benefits, risks, side effects, alternatives, likelihood of achieving goals and potential problems during recovery for the procedure that I have provided informed consent.    Scheduling Instructions:     Note: Always confirm laterality of pain with Ms. Frommelt, before procedure.     Transcribe to consent form and obtain patient signature.    Order Specific Question:   Physician/Practitioner attestation of informed consent for procedure/surgical case    Answer:   I, the physician/practitioner, attest that I have discussed with the patient the benefits, risks, side effects, alternatives, likelihood of achieving goals and potential problems during recovery for the procedure that I have provided informed consent.    Order Specific Question:   Procedure    Answer:   Diagnostic lumbar transforaminal epidural steroid injection under fluoroscopic guidance. (See notes for level and laterality.)    Order Specific Question:   Physician/Practitioner performing the procedure    Answer:   Sencere Symonette A. Laban Emperor, MD    Order Specific Question:   Indication/Reason    Answer:   Lumbar radiculopathy/radiculitis associated with lumbar stenosis   Provide equipment / supplies at bedside    "Block Tray" (Disposable  single use) Needle type: SpinalSpinal Amount/quantity: 2 Size: Medium (5-inch) Gauge: 22G    Standing Status:   Standing    Number of Occurrences:   1    Order Specific Question:   Specify    Answer:   Block Tray   Chronic Opioid Analgesic:  Oxycodone/APAP 7.5/325, 1 tab PO q 5 times a day. (56.25 mg/day of oxycodone) MME/day: 56.25 mg/day.   Medications ordered for procedure: Meds ordered this encounter  Medications   iohexol (OMNIPAQUE) 180 MG/ML injection 10 mL    Must be Myelogram-compatible. If not available, you  may substitute with a water-soluble, non-ionic, hypoallergenic, myelogram-compatible radiological contrast medium.   lidocaine (XYLOCAINE) 2 % (with pres) injection 400 mg   lactated ringers infusion  pentafluoroprop-tetrafluoroeth (GEBAUERS) aerosol   midazolam (VERSED) 5 MG/5ML injection 0.5-2 mg    Make sure Flumazenil is available in the pyxis when using this medication. If oversedation occurs, administer 0.2 mg IV over 15 sec. If after 45 sec no response, administer 0.2 mg again over 1 min; may repeat at 1 min intervals; not to exceed 4 doses (1 mg)   fentaNYL (SUBLIMAZE) injection 25-50 mcg    Make sure Narcan is available in the pyxis when using this medication. In the event of respiratory depression (RR< 8/min): Titrate NARCAN (naloxone) in increments of 0.1 to 0.2 mg IV at 2-3 minute intervals, until desired degree of reversal.   sodium chloride flush (NS) 0.9 % injection 2 mL   ropivacaine (PF) 2 mg/mL (0.2%) (NAROPIN) injection 2 mL   dexamethasone (DECADRON) injection 20 mg   Medications administered: We administered iohexol, lidocaine, lactated ringers, midazolam, fentaNYL, sodium chloride flush, ropivacaine (PF) 2 mg/mL (0.2%), and dexamethasone.  See the medical record for exact dosing, route, and time of administration.  Follow-up plan:   Return in about 2 weeks (around 01/22/2022) for Proc-day (T,Th), (F2F), (PPE).       Interventional Therapies  Risk  Complexity Considerations:   Estimated body mass index is 28.32 kg/m as calculated from the following:   Height as of this encounter: 5' (1.524 m).   Weight as of this encounter: 145 lb (65.8 kg). WNL   Planned  Pending:   (L) L-FCT RFA   Under consideration:   Diagnostic caudal diagnostic epidurogram Diagnostic CESI  Diagnostic bilateral cervical facet block  Possible bilateral cervical facet RFA  Diagnostic left L4-5 LESI  Diagnostic left S1 SNRB    Completed:   Therapeutic right Racz procedure x1  (08/17/2020) (04/09/84/85) (100% relief of the right LEP) Diagnostic caudal ESI x1 (07/11/2020) (100/100/0/0)  Palliative left L5-S1 LESI x2 (07/27/2015) (70/70/0/0)  Diagnostic left lumbar facet MBB x6 (05/14/2018) (n/a)  Palliative right lumbar facet MBB x9 (06/13/2020) (100/100/30 x3 days/>50)  Therapeutic right shoulder joint injection x1 (02/29/2016) (90/80/30)  Therapeutic left IA hip injection x3 (06/13/2020) (100/100/30/>50)  Diagnostic right SI joint block x3 (06/17/2017) (100/90/80/80)  Diagnostic/therapeutic left SI joint block x4 (06/17/2017) (100/90/80/80)  Palliative bilateral trochanteric bursa injection x1 (12/11/2016) (100/100/90/90)  Therapeutic right lumbar facet RFA x2 (01/11/2021) (100/100/70)  Therapeutic left lumbar facet RFA x2 (12/19/2020) (100/100/75/>75) Therapeutic right SI joint RFA x1 (08/07/2017) (100/100/80)  Therapeutic left SI joint RFA x1 (09/16/2017) (100/100/100)    Therapeutic  Palliative (PRN) options:   Palliative Racz procedure  Palliative bilateral lumbar facet block  Palliative bilateral SI joint block  Palliative right trochanteric bursa injection      Recent Visits Date Type Provider Dept  12/19/21 Office Visit Delano Metz, MD [facility]  Showing recent visits within past 90 days and meeting all other requirements Today's Visits Date Type Provider Dept  01/08/22 Procedure visit Delano Metz, MD [facility]  Showing today's visits and meeting all other requirements Future Appointments Date Type Provider Dept  01/22/22 Appointment Delano Metz, MD [facility]  04/03/22 Appointment Delano Metz, MD [facility]  Showing future appointments within next 90 days and meeting all other requirements  Disposition: Discharge home  Discharge (Date  Time): 01/08/2022; 1004 hrs.   Primary Care Physician: Jacqueline Loveless, MD Location: [facility] Note by: Oswaldo Done, MD Date: 01/08/2022; Time: 12:57 PM  Disclaimer:  Medicine is not an Visual merchandiser. The only guarantee in  medicine is that nothing is guaranteed. It is important to note that the decision to proceed with this intervention was based on the information collected from the patient. The Data and conclusions were drawn from the patient's questionnaire, the interview, and the physical examination. Because the information was provided in large part by the patient, it cannot be guaranteed that it has not been purposely or unconsciously manipulated. Every effort has been made to obtain as much relevant data as possible for this evaluation. It is important to note that the conclusions that lead to this procedure are derived in large part from the available data. Always take into account that the treatment will also be dependent on availability of resources and existing treatment guidelines, considered by other Pain Management Practitioners as being common knowledge and practice, at the time of the intervention. For Medico-Legal purposes, it is also important to point out that variation in procedural techniques and pharmacological choices are the acceptable norm. The indications, contraindications, technique, and results of the above procedure should only be interpreted and judged by a Board-Certified Interventional Pain Specialist with extensive familiarity and expertise in the same exact procedure and technique.

## 2022-01-08 ENCOUNTER — Ambulatory Visit
Admission: RE | Admit: 2022-01-08 | Discharge: 2022-01-08 | Disposition: A | Discharge status: Home / Self Care | Payer: Medicare Other | Source: Ambulatory Visit | Attending: Pain Medicine | Admitting: Pain Medicine

## 2022-01-08 ENCOUNTER — Ambulatory Visit: Payer: Medicare Other | Attending: Pain Medicine | Admitting: Pain Medicine

## 2022-01-08 ENCOUNTER — Encounter: Payer: Self-pay | Admitting: Pain Medicine

## 2022-01-08 ENCOUNTER — Other Ambulatory Visit: Payer: Self-pay

## 2022-01-08 VITALS — BP 104/87 | HR 76 | Temp 97.3°F | Resp 16 | Ht 60.0 in | Wt 143.0 lb

## 2022-01-08 DIAGNOSIS — M48061 Spinal stenosis, lumbar region without neurogenic claudication: Secondary | ICD-10-CM | POA: Diagnosis present

## 2022-01-08 DIAGNOSIS — G8929 Other chronic pain: Secondary | ICD-10-CM

## 2022-01-08 DIAGNOSIS — G96198 Other disorders of meninges, not elsewhere classified: Secondary | ICD-10-CM | POA: Diagnosis present

## 2022-01-08 DIAGNOSIS — M4696 Unspecified inflammatory spondylopathy, lumbar region: Secondary | ICD-10-CM | POA: Diagnosis present

## 2022-01-08 DIAGNOSIS — M5442 Lumbago with sciatica, left side: Secondary | ICD-10-CM | POA: Diagnosis present

## 2022-01-08 DIAGNOSIS — M961 Postlaminectomy syndrome, not elsewhere classified: Secondary | ICD-10-CM | POA: Diagnosis present

## 2022-01-08 DIAGNOSIS — M51369 Other intervertebral disc degeneration, lumbar region without mention of lumbar back pain or lower extremity pain: Secondary | ICD-10-CM

## 2022-01-08 DIAGNOSIS — Z981 Arthrodesis status: Secondary | ICD-10-CM | POA: Diagnosis present

## 2022-01-08 DIAGNOSIS — M5136 Other intervertebral disc degeneration, lumbar region with discogenic back pain only: Secondary | ICD-10-CM

## 2022-01-08 DIAGNOSIS — M48062 Spinal stenosis, lumbar region with neurogenic claudication: Secondary | ICD-10-CM | POA: Diagnosis not present

## 2022-01-08 DIAGNOSIS — M5416 Radiculopathy, lumbar region: Secondary | ICD-10-CM | POA: Insufficient documentation

## 2022-01-08 DIAGNOSIS — R937 Abnormal findings on diagnostic imaging of other parts of musculoskeletal system: Secondary | ICD-10-CM | POA: Diagnosis present

## 2022-01-08 DIAGNOSIS — M47816 Spondylosis without myelopathy or radiculopathy, lumbar region: Secondary | ICD-10-CM

## 2022-01-08 DIAGNOSIS — M4317 Spondylolisthesis, lumbosacral region: Secondary | ICD-10-CM

## 2022-01-08 MED ORDER — DEXAMETHASONE SODIUM PHOSPHATE 10 MG/ML IJ SOLN
20.0000 mg | Freq: Once | INTRAMUSCULAR | Status: AC
  Administered 2022-01-08: 20 mg

## 2022-01-08 MED ORDER — LACTATED RINGERS IV SOLN: Freq: Once | INTRAVENOUS | Status: AC

## 2022-01-08 MED ORDER — SODIUM CHLORIDE 0.9% FLUSH
2.0000 mL | Freq: Once | INTRAVENOUS | Status: AC
  Administered 2022-01-08: 2 mL

## 2022-01-08 MED ORDER — MIDAZOLAM HCL 5 MG/5ML IJ SOLN
INTRAMUSCULAR | Status: AC
  Filled 2022-01-08: qty 5

## 2022-01-08 MED ORDER — IOHEXOL 180 MG/ML  SOLN
10.0000 mL | Freq: Once | INTRAMUSCULAR | Status: AC
  Administered 2022-01-08: 10 mL via INTRATHECAL

## 2022-01-08 MED ORDER — FENTANYL CITRATE (PF) 100 MCG/2ML IJ SOLN
INTRAMUSCULAR | Status: AC
  Filled 2022-01-08: qty 2

## 2022-01-08 MED ORDER — SODIUM CHLORIDE (PF) 0.9 % IJ SOLN
INTRAMUSCULAR | Status: AC
  Filled 2022-01-08: qty 10

## 2022-01-08 MED ORDER — PENTAFLUOROPROP-TETRAFLUOROETH EX AERO: INHALATION_SPRAY | Freq: Once | CUTANEOUS | Status: DC

## 2022-01-08 MED ORDER — FENTANYL CITRATE (PF) 100 MCG/2ML IJ SOLN
25.0000 ug | INTRAMUSCULAR | Status: DC | PRN
  Administered 2022-01-08: 50 ug via INTRAVENOUS

## 2022-01-08 MED ORDER — ROPIVACAINE HCL 2 MG/ML IJ SOLN
2.0000 mL | Freq: Once | INTRAMUSCULAR | Status: AC
  Administered 2022-01-08: 2 mL via EPIDURAL

## 2022-01-08 MED ORDER — IOHEXOL 180 MG/ML  SOLN
INTRAMUSCULAR | Status: AC
  Filled 2022-01-08: qty 10

## 2022-01-08 MED ORDER — ROPIVACAINE HCL 2 MG/ML IJ SOLN
INTRAMUSCULAR | Status: AC
  Filled 2022-01-08: qty 20

## 2022-01-08 MED ORDER — DEXAMETHASONE SODIUM PHOSPHATE 10 MG/ML IJ SOLN
INTRAMUSCULAR | Status: AC
  Filled 2022-01-08: qty 2

## 2022-01-08 MED ORDER — LIDOCAINE HCL 2 % IJ SOLN
20.0000 mL | Freq: Once | INTRAMUSCULAR | Status: AC
  Administered 2022-01-08: 400 mg

## 2022-01-08 MED ORDER — LIDOCAINE HCL 2 % IJ SOLN
INTRAMUSCULAR | Status: AC
  Filled 2022-01-08: qty 20

## 2022-01-08 MED ORDER — MIDAZOLAM HCL 5 MG/5ML IJ SOLN
0.5000 mg | Freq: Once | INTRAMUSCULAR | Status: AC
  Administered 2022-01-08: 2 mg via INTRAVENOUS

## 2022-01-08 NOTE — Progress Notes (Signed)
Safety precautions to be maintained throughout the outpatient stay will include: orient to surroundings, keep bed in low position, maintain call bell within reach at all times, provide assistance with transfer out of bed and ambulation.  

## 2022-01-08 NOTE — Patient Instructions (Signed)

## 2022-01-09 ENCOUNTER — Telehealth: Payer: Self-pay | Admitting: *Deleted

## 2022-01-09 NOTE — Telephone Encounter (Signed)
Attempted to call for post procedure follow-up. Message left. 

## 2022-01-10 ENCOUNTER — Telehealth: Payer: Self-pay | Admitting: Pain Medicine

## 2022-01-10 NOTE — Telephone Encounter (Signed)
Patient had procedure on 01-08-22. She is having a lot of pain and cannot pick up meds until 01-11-22. Wants to see if Dr. Dossie Arbour will send script in for this post procedure pain

## 2022-01-10 NOTE — Telephone Encounter (Signed)
Attempted to call patient back, but message said that the call could not be completed at this time.

## 2022-01-21 NOTE — Progress Notes (Unsigned)
PROVIDER NOTE: Information contained herein reflects review and annotations entered in association with encounter. Interpretation of such information and data should be left to medically-trained personnel. Information provided to patient can be located elsewhere in the medical record under "Patient Instructions". Document created using STT-dictation technology, any transcriptional errors that may result from process are unintentional.    Patient: Jacqueline Thornton  Service Category: E/M  Provider: Gaspar Cola, MD  DOB: 08/06/1950  DOS: 01/22/2022  Specialty: Interventional Pain Management  MRN: [id number]  Setting: Ambulatory outpatient  PCP: Perrin Maltese, MD  Type: Established Patient    Referring Provider: Perrin Maltese, MD  Location: Office  Delivery: Face-to-face     HPI  [name], a 71 y.o. year old female, is here today because of her No primary diagnosis found.. Jacqueline Thornton primary complain today is No chief complaint on file. Last encounter: My last encounter with her was on 01/10/2022. Pertinent problems: Jacqueline Thornton has Chronic pain syndrome; Headache, migraine; Chronic low back pain (Bilateral) (R>L) w/o sciatica; Spondylosis of lumbar spine; Lumbar annular disc tear (L4-5); Discogenic low back pain (L3-4 and L4-5); Lumbar facet hypertrophy; Lumbar facet syndrome (Bilateral) (R>L); Chronic neck pain (Left); Cervical spondylosis; Hx of cervical spine surgery; Cervical spinal fusion (C6-7 interbody fusion); Chronic sacroiliac joint pain (Bilateral) (L>R); Chronic lumbar radicular pain (Left); Trochanteric bursitis of hip (Right); Chronic hip pain (Bilateral) (L>R); Neurogenic pain; Myofascial pain; Osteoarthritis of hip (Left); Arthrodesis status; DDD (degenerative disc disease), lumbar; Myalgia; Trochanteric bursitis of hip (Bilateral) (L>R); Spondylosis without myelopathy or radiculopathy, lumbosacral region; Spondylosis without myelopathy or radiculopathy, sacral and  sacrococcygeal region; Other specified dorsopathies, sacral and sacrococcygeal region; Age-related osteoporosis without current pathological fracture; Bone island of right femur; Inflammatory spondylopathy of lumbar region [facility]); Trigger point with back pain (Right); Fibromyalgia; DDD (degenerative disc disease), cervical; Spondylolisthesis of lumbar region; History of lumbar spinal fusion (Dr. Erline Levine) (12/11/2018); Failed back surgical syndrome; Unilateral occipital headache (Left); Cervicogenic headache (Left); Chronic hip pain (Left); Spondylolisthesis at L5-S1 level; Spondylosis of lumbosacral region without myelopathy or radiculopathy; Epidural fibrosis; Enthesopathy of hip region (Left); Chronic sacroiliac joint pain (Left); Enthesopathy of sacroiliac joint (Left); Somatic dysfunction of sacroiliac joint (Left); Herniated cervical disc without myelopathy; Chronic sacroiliac joint pain (Right); Osteoarthritis of sacroiliac joint (Right) ([place]); Somatic dysfunction of sacroiliac joint (Right); Chronic hip pain (Right); Neck pain; Herniated nucleus pulposus, C3-4; Prolapsed cervical intervertebral disc; Cervical spinal stenosis; Radiculopathy, cervical region; Lumbar central spinal stenosis, 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 Pain Assessment: Severity of   is reported as a  /10. Location:    / . Onset:  . Quality:  . Timing:  . Modifying factor(s):  Marland Kitchen Vitals:  vitals were not taken for this visit.   Reason for encounter: post-procedure evaluation and assessment. ***  Post-procedure evaluation   Type: Lumbar trans-foraminal epidural steroid injection (L-TFESI) #1  Laterality: Bilateral (-50)  Level: L4 nerve root(s) Imaging: Fluoroscopy-guided Anesthesia: Local anesthesia (1-2% Lidocaine) Anxiolysis: IV  Versed 2.0 mg Sedation: Moderate conscious sedation. DOS: 01/08/2022  Performed by: Gaspar Cola, MD  Purpose: Diagnostic/Therapeutic Indications: Lumbar radicular pain severe enough to impact quality of life or function. 1. 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 Discogenic low back pain (L3-4 and L4-5)   3. Lumbar annular disc tear (L4-5)   4.  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 Chronic lumbar radicular pain (Left)   6. DDD (degenerative disc disease), lumbar   7. Lumbar facet hypertrophy   8. Epidural fibrosis   9. Failed back surgical syndrome   10. Spondylolisthesis at L5-S1 level   11. History of lumbar spinal fusion (Dr. Erline Levine) (12/11/2018)   12. Inflammatory spondylopathy of lumbar region ([place])   13. Abnormal MRI, lumbar spine & Sacrum (12/26/2017)    NAS-11 Pain score:   Pre-procedure: 8 /10   Post-procedure: 2 /10      Effectiveness:  Initial hour after procedure:   ***. Subsequent 4-6 hours post-procedure:   ***. Analgesia past initial 6 hours:   ***. Ongoing improvement:  Analgesic:  *** Function:    ***    ROM:    ***     Pharmacotherapy Assessment  Analgesic: Oxycodone/APAP 7.5/325, 1 tab PO q 5 times a day. (56.25 mg/day of oxycodone) MME/day: 56.25 mg/day.   Monitoring: [place] PMP: PDMP reviewed during this encounter.       Pharmacotherapy: No side-effects or adverse reactions reported. Compliance: No problems identified. Effectiveness: Clinically acceptable.  No notes on file  UDS:  Summary  Date Value Ref Range Status  12/19/2021 Note  Final    Comment:    ==================================================================== [employer] (MW) ==================================================================== Test                             Result       Flag       Units  Drug Present and Declared for Prescription Verification   Oxycodone                      >8772        EXPECTED   ng/mg creat    Oxymorphone                    1343         EXPECTED   ng/mg creat   Noroxycodone                   >8772        EXPECTED   ng/mg creat   Noroxymorphone                 446          EXPECTED   ng/mg creat    Sources of oxycodone are scheduled prescription medications.    Oxymorphone, noroxycodone, and noroxymorphone are expected    metabolites of oxycodone. Oxymorphone is also available as a    scheduled prescription medication.  ==================================================================== Test                      Result    Flag   Units      Ref Range   Creatinine              114              mg/dL      >=20 ==================================================================== Declared Medications:  The flagging and interpretation on this report are based on the  following declared medications.  Unexpected results may arise from  inaccuracies in the declared medications.   **Note: The testing scope of this panel includes these medications:   Oxycodone (Percocet)   **Note: The testing scope of this panel does not include the  following reported medications:   Acetaminophen (Percocet)  Atorvastatin  Calcium  Cyclobenzaprine (Flexeril)  Duloxetine (Cymbalta)  Enalapril (Vasotec)  Estradiol (Estrace)  Gabapentin (Neurontin)  Ibandronate (Boniva)  Melatonin  Multivitamin  Omeprazole  Spironolactone  Vitamin B12 ==================================================================== For clinical consultation, please call [phone]. ====================================================================      ROS  Constitutional: Denies any fever or chills Gastrointestinal: No reported hemesis, hematochezia, vomiting, or acute GI distress Musculoskeletal: Denies any acute onset joint swelling, redness, loss of ROM, or weakness Neurological: No reported episodes of acute onset apraxia, aphasia, dysarthria, agnosia, amnesia, paralysis, loss of coordination, or loss of  consciousness  Medication Review  Calcium, DULoxetine, Melatonin, Vitamin B-12, atorvastatin, cyclobenzaprine, enalapril, estradiol, furosemide, gabapentin, ibandronate, meloxicam, multivitamin with minerals, omeprazole, oxyCODONE-acetaminophen, rosuvastatin, and spironolactone  History Review  Allergy: Jacqueline Thornton has No Known Allergies. Drug: Jacqueline Thornton  reports no history of drug use. Alcohol:  reports no history of alcohol use. Tobacco:  reports that she quit smoking about 18 years ago. Her smoking use included cigarettes. She has never used smokeless tobacco. Social: Jacqueline Thornton  reports that she quit smoking about 18 years ago. Her smoking use included cigarettes. She has never used smokeless tobacco. She reports that she does not drink alcohol and does not use drugs. Medical:  has a past medical history of Absolute anemia (04/11/2015), Acute postoperative pain (08/07/2017), Angina pectoris ([place]), Anxiety, Arthritis, Arthropathy of sacroiliac joint (04/11/2015), Atypical face pain (04/24/2015), Back pain, CAD (coronary artery disease), Cancer ([place]), Chronic back pain, Constipation, Depression, Fibromyalgia, GERD (gastroesophageal reflux disease), 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) (04/11/2015), H/O: hysterectomy (1979), Heart murmur, Hyperlipidemia, Hypertension, Low back pain (04/06/2015), Lumbar radicular pain (04/18/2015), Migraine, Narrowing of intervertebral disc space (04/11/2015), Sacroiliac joint pain (04/11/2015), and Spine disorder. Surgical: Ms. Vokes  has a past surgical history that includes Cholecystectomy; Abdominal hysterectomy; Neck surgery; Shoulder surgery; Colonoscopy with propofol (N/A, 07/19/2015); caract surger; Laparoscopic salpingo oophorectomy (Bilateral, 03/03/2018); Cystoscopy (03/03/2018); Tonsillectomy; Appendectomy; Tumor removal; Anterior lumbar fusion (N/A, 12/11/2018); Abdominal exposure (N/A, 12/11/2018); Anterior cervical decomp/discectomy fusion (N/A,  10/06/2020); Toe Fusion (Right); Esophagogastroduodenoscopy (egd) with propofol (N/A, 09/11/2021); and Colonoscopy with propofol (N/A, 09/11/2021). Family: family history includes Colon cancer in her maternal grandmother and mother; Diabetes in her father and mother; Hypertension in her sister; Stroke in her mother.  Laboratory Chemistry Profile   Renal Lab Results  Component Value Date   BUN 8 10/06/2020   CREATININE 0.90 06/20/2021   BCR 8 (L) 12/16/2019   GFRAA 47 (L) 12/16/2019   GFRNONAA >60 10/06/2020    Hepatic Lab Results  Component Value Date   AST 15 12/16/2019   ALT 14 03/08/2018   ALBUMIN 4.0 12/16/2019   ALKPHOS 60 12/16/2019    Electrolytes Lab Results  Component Value Date   NA 137 10/06/2020   K 3.6 10/06/2020   CL 105 10/06/2020   CALCIUM 8.6 (L) 10/06/2020   MG 2.4 (H) 12/16/2019    Bone Lab Results  Component Value Date   25OHVITD1 53 12/16/2019   25OHVITD2 <1.0 12/16/2019   25OHVITD3 53 12/16/2019    Inflammation (CRP: Acute Phase) (ESR: Chronic Phase) Lab Results  Component Value Date   CRP 4 12/16/2019   ESRSEDRATE 6 12/16/2019         Note: Above Lab results reviewed.  Recent Imaging Review  DG PAIN CLINIC C-ARM 1-60 MIN NO REPORT Fluoro was used, but no Radiologist interpretation will be provided.  Please refer to "NOTES" tab for provider progress note. Note: Reviewed  Physical Exam  General appearance: Well nourished, well developed, and well hydrated. In no apparent acute distress Mental status: Alert, oriented x 3 (person, place, & time)       Respiratory: No evidence of acute respiratory distress Eyes: PERLA Vitals: There were no vitals taken for this visit. BMI: Estimated body mass index is 27.93 kg/m as calculated from the following:   Height as of 01/08/22: 5' (1.524 m).   Weight as of 01/08/22: 143 lb (64.9 kg). Ideal: Ideal body weight: 45.5 kg (100 lb 4.9 oz) Adjusted ideal body weight: 53.2 kg (117 lb 6.2  oz)  Assessment   Diagnosis Status  No diagnosis found. Controlled Controlled Controlled   Updated Problems: No problems updated.  Plan of Care  Problem-specific:  No problem-specific Assessment & Plan notes found for this encounter.  Jacqueline Thornton has a current medication list which includes the following long-term medication(s): atorvastatin, calcium, vitamin b-12, duloxetine, enalapril, furosemide, gabapentin, omeprazole, oxycodone-acetaminophen, [START ON 02/10/2022] oxycodone-acetaminophen, [START ON 03/12/2022] oxycodone-acetaminophen, and spironolactone.  Pharmacotherapy (Medications Ordered): No orders of the defined types were placed in this encounter.  Orders:  No orders of the defined types were placed in this encounter.  Follow-up plan:   No follow-ups on file.     Interventional Therapies  Risk  Complexity Considerations:   Estimated body mass index is 28.32 kg/m as calculated from the following:   Height as of this encounter: 5' (1.524 m).   Weight as of this encounter: 145 lb (65.8 kg). WNL   Planned  Pending:   (L) L-FCT RFA   Under consideration:   Diagnostic caudal diagnostic epidurogram Diagnostic CESI  Diagnostic bilateral cervical facet block  Possible bilateral cervical facet RFA  Diagnostic left L4-5 LESI  Diagnostic left S1 SNRB    Completed:   Therapeutic right Racz procedure x1 (08/17/2020) (04/09/84/85) (100% relief of the right LEP) Diagnostic caudal ESI x1 (07/11/2020) (100/100/0/0)  Palliative left L5-S1 LESI x2 (07/27/2015) (70/70/0/0)  Diagnostic left lumbar facet MBB x6 (05/14/2018) (n/a)  Palliative right lumbar facet MBB x9 (06/13/2020) (100/100/30 x3 days/>50)  Therapeutic right shoulder joint injection x1 (02/29/2016) (90/80/30)  Therapeutic left IA hip injection x3 (06/13/2020) (100/100/30/>50)  Diagnostic right SI joint block x3 (06/17/2017) (100/90/80/80)  Diagnostic/therapeutic left SI joint block x4 (06/17/2017)  (100/90/80/80)  Palliative bilateral trochanteric bursa injection x1 (12/11/2016) (100/100/90/90)  Therapeutic right lumbar facet RFA x2 (01/11/2021) (100/100/70)  Therapeutic left lumbar facet RFA x2 (12/19/2020) (100/100/75/>75) Therapeutic right SI joint RFA x1 (08/07/2017) (100/100/80)  Therapeutic left SI joint RFA x1 (09/16/2017) (100/100/100)    Therapeutic  Palliative (PRN) options:   Palliative Racz procedure  Palliative bilateral lumbar facet block  Palliative bilateral SI joint block  Palliative right trochanteric bursa injection       Recent Visits Date Type Provider Dept  01/08/22 Procedure visit Milinda Pointer, MD [facility]  12/19/21 Office Visit Milinda Pointer, MD [facility]  Showing recent visits within past 90 days and meeting all other requirements Future Appointments Date Type Provider Dept  01/22/22 Appointment Milinda Pointer, MD [facility]  04/03/22 Appointment Milinda Pointer, MD [facility]  Showing future appointments within next 90 days and meeting all other requirements  I discussed the assessment and treatment plan with the patient. The patient was provided an opportunity to ask questions and all were answered. The patient agreed with the plan and demonstrated an understanding of the instructions.  Patient advised to call back or seek an in-person  evaluation if the symptoms or condition worsens.  Duration of encounter: *** minutes.  Total time on encounter, as per AMA guidelines included both the face-to-face and non-face-to-face time personally spent by the physician and/or other qualified health care professional(s) on the day of the encounter (includes time in activities that require the physician or other qualified health care professional and does not include time in activities normally performed by clinical staff). Physician's time may include the following activities when performed: preparing to see  the patient (eg, review of tests, pre-charting review of records) obtaining and/or reviewing separately obtained history performing a medically appropriate examination and/or evaluation counseling and educating the patient/family/caregiver ordering medications, tests, or procedures referring and communicating with other health care professionals (when not separately reported) documenting clinical information in the electronic or other health record independently interpreting results (not separately reported) and communicating results to the patient/ family/caregiver care coordination (not separately reported)  Note by: Gaspar Cola, MD Date: 01/22/2022; Time: 7:29 AM

## 2022-01-22 ENCOUNTER — Ambulatory Visit: Payer: Medicare Other | Attending: Pain Medicine | Admitting: Pain Medicine

## 2022-01-22 ENCOUNTER — Encounter: Payer: Self-pay | Admitting: Pain Medicine

## 2022-01-22 VITALS — BP 127/66 | HR 87 | Temp 97.2°F | Resp 16 | Ht 60.0 in | Wt 143.0 lb

## 2022-01-22 DIAGNOSIS — M25551 Pain in right hip: Secondary | ICD-10-CM

## 2022-01-22 DIAGNOSIS — M5416 Radiculopathy, lumbar region: Secondary | ICD-10-CM | POA: Diagnosis present

## 2022-01-22 DIAGNOSIS — M5136 Other intervertebral disc degeneration, lumbar region with discogenic back pain only: Secondary | ICD-10-CM

## 2022-01-22 DIAGNOSIS — R937 Abnormal findings on diagnostic imaging of other parts of musculoskeletal system: Secondary | ICD-10-CM

## 2022-01-22 DIAGNOSIS — M5442 Lumbago with sciatica, left side: Secondary | ICD-10-CM | POA: Diagnosis present

## 2022-01-22 DIAGNOSIS — M961 Postlaminectomy syndrome, not elsewhere classified: Secondary | ICD-10-CM | POA: Diagnosis present

## 2022-01-22 DIAGNOSIS — M47816 Spondylosis without myelopathy or radiculopathy, lumbar region: Secondary | ICD-10-CM | POA: Diagnosis present

## 2022-01-22 DIAGNOSIS — M25552 Pain in left hip: Secondary | ICD-10-CM | POA: Diagnosis present

## 2022-01-22 DIAGNOSIS — G8929 Other chronic pain: Secondary | ICD-10-CM

## 2022-01-22 NOTE — Addendum Note (Signed)
Addended by: Milinda Pointer A on: 01/22/2022 04:13 PM   Modules accepted: Level of Service

## 2022-01-25 ENCOUNTER — Ambulatory Visit
Admission: RE | Admit: 2022-01-25 | Discharge: 2022-01-25 | Disposition: A | Discharge status: Home / Self Care | Payer: Medicare Other | Source: Ambulatory Visit | Attending: Pain Medicine | Admitting: Pain Medicine

## 2022-01-25 DIAGNOSIS — G8929 Other chronic pain: Secondary | ICD-10-CM

## 2022-01-25 DIAGNOSIS — R937 Abnormal findings on diagnostic imaging of other parts of musculoskeletal system: Secondary | ICD-10-CM

## 2022-01-25 DIAGNOSIS — M961 Postlaminectomy syndrome, not elsewhere classified: Secondary | ICD-10-CM | POA: Insufficient documentation

## 2022-01-25 DIAGNOSIS — M5442 Lumbago with sciatica, left side: Secondary | ICD-10-CM | POA: Insufficient documentation

## 2022-01-25 DIAGNOSIS — M47816 Spondylosis without myelopathy or radiculopathy, lumbar region: Secondary | ICD-10-CM | POA: Diagnosis present

## 2022-01-25 DIAGNOSIS — M25551 Pain in right hip: Secondary | ICD-10-CM

## 2022-01-25 DIAGNOSIS — M25552 Pain in left hip: Secondary | ICD-10-CM | POA: Insufficient documentation

## 2022-01-25 DIAGNOSIS — M5416 Radiculopathy, lumbar region: Secondary | ICD-10-CM | POA: Diagnosis present

## 2022-01-25 DIAGNOSIS — M5136 Other intervertebral disc degeneration, lumbar region: Secondary | ICD-10-CM | POA: Insufficient documentation

## 2022-02-01 ENCOUNTER — Other Ambulatory Visit: Payer: Self-pay | Admitting: Internal Medicine

## 2022-02-01 DIAGNOSIS — Z1231 Encounter for screening mammogram for malignant neoplasm of breast: Secondary | ICD-10-CM

## 2022-02-04 ENCOUNTER — Telehealth: Payer: Self-pay

## 2022-02-04 NOTE — Patient Outreach (Signed)
  Care Coordination   02/04/2022 Name: JANNEL LYNNE MRN: [id number] DOB: 1951/02/21   Care Coordination Outreach Attempts:  Contact was made with the patient today to offer care coordination services as a benefit of their health plan. The patient requested a return call on a later date.   Follow Up Plan:  Additional outreach attempts will be made to offer the patient care coordination information and services.   Encounter Outcome:  Pt. Request to Call Back  Care Coordination Interventions Activated:  No   Care Coordination Interventions:  No, not indicated    Thea Silversmith, RN, MSN, BSN, CCM Care Coordinator [phone]

## 2022-02-06 NOTE — Progress Notes (Unsigned)
PROVIDER NOTE: Interpretation of information contained herein should be left to medically-trained personnel. Specific patient instructions are provided elsewhere under "Patient Instructions" section of medical record. This document was created in part using STT-dictation technology, any transcriptional errors that may result from this process are unintentional.  Patient: Jacqueline Thornton Type: Established DOB: Sep 07, 1950 MRN: [id number] PCP: Perrin Maltese, MD  Service: Procedure DOS: 02/07/2022 Setting: Ambulatory Location: Ambulatory outpatient facility Delivery: Face-to-face Provider: Gaspar Cola, MD Specialty: Interventional Pain Management Specialty designation: 09 Location: Outpatient facility Ref. Prov.: Perrin Maltese, MD    Procedure:           Type: Lumbar Facet, Medial Branch Block(s)  L7  R10 (1st of 2023)   Laterality: Bilateral  Level: L2, L3, L4, L5, & S1 Medial Branch Level(s). Injecting these levels blocks the L3-4, L4-5 and L5-S1 lumbar facet joints. Imaging: Fluoroscopic guidance         Anesthesia: Local anesthesia (1-2% Lidocaine) Anxiolysis: IV Versed         Sedation: None. DOS: 02/07/2022 Performed by: Gaspar Cola, MD  Primary Purpose: Diagnostic/Therapeutic Indications: Low back pain severe enough to impact quality of life or function. No diagnosis found. NAS-11 Pain score:   Pre-procedure:  /10   Post-procedure:  /10     Position / Prep / Materials:  Position: Prone  Prep solution: DuraPrep (Iodine Povacrylex [0.7% available iodine] and Isopropyl Alcohol, 74% w/w) Area Prepped: Posterolateral Lumbosacral Spine (Wide prep: From the lower border of the scapula down to the end of the tailbone and from flank to flank.)  Materials:  Tray: Block Needle(s):  Type: Spinal  Gauge (G): 22  Length: 5-in Qty: 4  Pre-op H&P Assessment:  Jacqueline Thornton is a 71 y.o. (year old), female patient, seen today for interventional treatment. She  has a  past surgical history that includes Cholecystectomy; Abdominal hysterectomy; Neck surgery; Shoulder surgery; Colonoscopy with propofol (N/A, 07/19/2015); caract surger; Laparoscopic salpingo oophorectomy (Bilateral, 03/03/2018); Cystoscopy (03/03/2018); Tonsillectomy; Appendectomy; Tumor removal; Anterior lumbar fusion (N/A, 12/11/2018); Abdominal exposure (N/A, 12/11/2018); Anterior cervical decomp/discectomy fusion (N/A, 10/06/2020); Toe Fusion (Right); Esophagogastroduodenoscopy (egd) with propofol (N/A, 09/11/2021); and Colonoscopy with propofol (N/A, 09/11/2021). Jacqueline Thornton has a current medication list which includes the following prescription(s): atorvastatin, calcium, vitamin b-12, cyclobenzaprine, duloxetine, enalapril, estradiol, furosemide, gabapentin, ibandronate, melatonin, meloxicam, multivitamin with minerals, omeprazole, oxycodone-acetaminophen, [START ON 02/10/2022] oxycodone-acetaminophen, [START ON 03/12/2022] oxycodone-acetaminophen, rosuvastatin, and spironolactone. Her primarily concern today is the No chief complaint on file.  Initial Vital Signs:  Pulse/HCG Rate:    Temp:   Resp:   BP:   SpO2:    BMI: Estimated body mass index is 27.93 kg/m as calculated from the following:   Height as of 01/22/22: 5' (1.524 m).   Weight as of 01/22/22: 143 lb (64.9 kg).  Risk Assessment: Allergies: Reviewed. She has No Known Allergies.  Allergy Precautions: None required Coagulopathies: Reviewed. None identified.  Blood-thinner therapy: None at this time Active Infection(s): Reviewed. None identified. Jacqueline Thornton is afebrile  Site Confirmation: Jacqueline Thornton was asked to confirm the procedure and laterality before marking the site Procedure checklist: Completed Consent: Before the procedure and under the influence of no sedative(s), amnesic(s), or anxiolytics, the patient was informed of the treatment options, risks and possible complications. To fulfill our ethical and legal obligations, as  recommended by the American Medical Association's Code of Ethics, I have informed the patient of my clinical impression; the nature and purpose of the treatment or procedure; the  risks, benefits, and possible complications of the intervention; the alternatives, including doing nothing; the risk(s) and benefit(s) of the alternative treatment(s) or procedure(s); and the risk(s) and benefit(s) of doing nothing. The patient was provided information about the general risks and possible complications associated with the procedure. These may include, but are not limited to: failure to achieve desired goals, infection, bleeding, organ or nerve damage, allergic reactions, paralysis, and death. In addition, the patient was informed of those risks and complications associated to Spine-related procedures, such as failure to decrease pain; infection (i.e.: Meningitis, epidural or intraspinal abscess); bleeding (i.e.: epidural hematoma, subarachnoid hemorrhage, or any other type of intraspinal or peri-dural bleeding); organ or nerve damage (i.e.: Any type of peripheral nerve, nerve root, or spinal cord injury) with subsequent damage to sensory, motor, and/or autonomic systems, resulting in permanent pain, numbness, and/or weakness of one or several areas of the body; allergic reactions; (i.e.: anaphylactic reaction); and/or death. Furthermore, the patient was informed of those risks and complications associated with the medications. These include, but are not limited to: allergic reactions (i.e.: anaphylactic or anaphylactoid reaction(s)); adrenal axis suppression; blood sugar elevation that in diabetics may result in ketoacidosis or comma; water retention that in patients with history of congestive heart failure may result in shortness of breath, pulmonary edema, and decompensation with resultant heart failure; weight gain; swelling or edema; medication-induced neural toxicity; particulate matter embolism and blood vessel  occlusion with resultant organ, and/or nervous system infarction; and/or aseptic necrosis of one or more joints. Finally, the patient was informed that Medicine is not an exact science; therefore, there is also the possibility of unforeseen or unpredictable risks and/or possible complications that may result in a catastrophic outcome. The patient indicated having understood very clearly. We have given the patient no guarantees and we have made no promises. Enough time was given to the patient to ask questions, all of which were answered to the patient's satisfaction. Jacqueline Thornton has indicated that she wanted to continue with the procedure. Attestation: I, the ordering provider, attest that I have discussed with the patient the benefits, risks, side-effects, alternatives, likelihood of achieving goals, and potential problems during recovery for the procedure that I have provided informed consent. Date  Time: {CHL [facility] TIME CHOICES:[id number]}  Pre-Procedure Preparation:  Monitoring: As per clinic protocol. Respiration, ETCO2, SpO2, BP, heart rate and rhythm monitor placed and checked for adequate function Safety Precautions: Patient was assessed for positional comfort and pressure points before starting the procedure. Time-out: I initiated and conducted the "Time-out" before starting the procedure, as per protocol. The patient was asked to participate by confirming the accuracy of the "Time Out" information. Verification of the correct person, site, and procedure were performed and confirmed by me, the nursing staff, and the patient. "Time-out" conducted as per Joint Commission's Universal Protocol (UP.01.01.01). Time:    Description of Procedure:          Laterality: Bilateral. The procedure was performed in identical fashion on both sides. Targeted Levels:  L2, L3, L4, L5, & S1 Medial Branch Level(s)  Safety Precautions: Aspiration looking for blood return was conducted prior to all injections.  At no point did we inject any substances, as a needle was being advanced. Before injecting, the patient was told to immediately notify me if she was experiencing any new onset of "ringing in the ears, or metallic taste in the mouth". No attempts were made at seeking any paresthesias. Safe injection practices and needle disposal techniques used. Medications properly checked  for expiration dates. SDV (single dose vial) medications used. After the completion of the procedure, all disposable equipment used was discarded in the proper designated medical waste containers. Local Anesthesia: Protocol guidelines were followed. The patient was positioned over the fluoroscopy table. The area was prepped in the usual manner. The time-out was completed. The target area was identified using fluoroscopy. A 12-in long, straight, sterile hemostat was used with fluoroscopic guidance to locate the targets for each level blocked. Once located, the skin was marked with an approved surgical skin marker. Once all sites were marked, the skin (epidermis, dermis, and hypodermis), as well as deeper tissues (fat, connective tissue and muscle) were infiltrated with a small amount of a short-acting local anesthetic, loaded on a 10cc syringe with a 25G, 1.5-in  Needle. An appropriate amount of time was allowed for local anesthetics to take effect before proceeding to the next step. Local Anesthetic: Lidocaine 2.0% The unused portion of the local anesthetic was discarded in the proper designated containers. Technical description of process:  L2 Medial Branch Nerve Block (MBB): The target area for the L2 medial branch is at the junction of the postero-lateral aspect of the superior articular process and the superior, posterior, and medial edge of the transverse process of L3. Under fluoroscopic guidance, a Quincke needle was inserted until contact was made with os over the superior postero-lateral aspect of the pedicular shadow (target area).  After negative aspiration for blood, 0.5 mL of the nerve block solution was injected without difficulty or complication. The needle was removed intact. L3 Medial Branch Nerve Block (MBB): The target area for the L3 medial branch is at the junction of the postero-lateral aspect of the superior articular process and the superior, posterior, and medial edge of the transverse process of L4. Under fluoroscopic guidance, a Quincke needle was inserted until contact was made with os over the superior postero-lateral aspect of the pedicular shadow (target area). After negative aspiration for blood, 0.5 mL of the nerve block solution was injected without difficulty or complication. The needle was removed intact. L4 Medial Branch Nerve Block (MBB): The target area for the L4 medial branch is at the junction of the postero-lateral aspect of the superior articular process and the superior, posterior, and medial edge of the transverse process of L5. Under fluoroscopic guidance, a Quincke needle was inserted until contact was made with os over the superior postero-lateral aspect of the pedicular shadow (target area). After negative aspiration for blood, 0.5 mL of the nerve block solution was injected without difficulty or complication. The needle was removed intact. L5 Medial Branch Nerve Block (MBB): The target area for the L5 medial branch is at the junction of the postero-lateral aspect of the superior articular process and the superior, posterior, and medial edge of the sacral ala. Under fluoroscopic guidance, a Quincke needle was inserted until contact was made with os over the superior postero-lateral aspect of the pedicular shadow (target area). After negative aspiration for blood, 0.5 mL of the nerve block solution was injected without difficulty or complication. The needle was removed intact. S1 Medial Branch Nerve Block (MBB): The target area for the S1 medial branch is at the posterior and inferior 6 o'clock  position of the L5-S1 facet joint. Under fluoroscopic guidance, the Quincke needle inserted for the L5 MBB was redirected until contact was made with os over the inferior and postero aspect of the sacrum, at the 6 o' clock position under the L5-S1 facet joint (Target area). After negative  aspiration for blood, 0.5 mL of the nerve block solution was injected without difficulty or complication. The needle was removed intact.  Once the entire procedure was completed, the treated area was cleaned, making sure to leave some of the prepping solution back to take advantage of its long term bactericidal properties.         Illustration of the posterior view of the lumbar spine and the posterior neural structures. Laminae of L2 through S1 are labeled. DPRL5, dorsal primary ramus of L5; DPRS1, dorsal primary ramus of S1; DPR3, dorsal primary ramus of L3; FJ, facet (zygapophyseal) joint L3-L4; I, inferior articular process of L4; LB1, lateral branch of dorsal primary ramus of L1; IAB, inferior articular branches from L3 medial branch (supplies L4-L5 facet joint); IBP, intermediate branch plexus; MB3, medial branch of dorsal primary ramus of L3; NR3, third lumbar nerve root; S, superior articular process of L5; SAB, superior articular branches from L4 (supplies L4-5 facet joint also); TP3, transverse process of L3.  There were no vitals filed for this visit.   Start Time:   hrs. End Time:   hrs.  Imaging Guidance (Spinal):          Type of Imaging Technique: Fluoroscopy Guidance (Spinal) Indication(s): Assistance in needle guidance and placement for procedures requiring needle placement in or near specific anatomical locations not easily accessible without such assistance. Exposure Time: Please see nurses notes. Contrast: None used. Fluoroscopic Guidance: I was personally present during the use of fluoroscopy. "Tunnel Vision Technique" used to obtain the best possible view of the target area. Parallax error  corrected before commencing the procedure. "Direction-depth-direction" technique used to introduce the needle under continuous pulsed fluoroscopy. Once target was reached, antero-posterior, oblique, and lateral fluoroscopic projection used confirm needle placement in all planes. Images permanently stored in EMR. Interpretation: No contrast injected. I personally interpreted the imaging intraoperatively. Adequate needle placement confirmed in multiple planes. Permanent images saved into the patient's record.  Antibiotic Prophylaxis:   Anti-infectives (From admission, onward)    None      Indication(s): None identified  Post-operative Assessment:  Post-procedure Vital Signs:  Pulse/HCG Rate:    Temp:   Resp:   BP:   SpO2:    EBL: None  Complications: No immediate post-treatment complications observed by team, or reported by patient.  Note: The patient tolerated the entire procedure well. A repeat set of vitals were taken after the procedure and the patient was kept under observation following institutional policy, for this type of procedure. Post-procedural neurological assessment was performed, showing return to baseline, prior to discharge. The patient was provided with post-procedure discharge instructions, including a section on how to identify potential problems. Should any problems arise concerning this procedure, the patient was given instructions to immediately contact us, at any time, without hesitation. In any case, we plan to contact the patient by telephone for a follow-up status report regarding this interventional procedure.  Comments:  No additional relevant information.  Plan of Care  Orders:  No orders of the defined types were placed in this encounter.  Chronic Opioid Analgesic:  Oxycodone/APAP 7.5/325, 1 tab PO q 5 times a day. (56.25 mg/day of oxycodone) MME/day: 56.25 mg/day.   Medications ordered for procedure: No orders of the defined types were placed in  this encounter.  Medications administered: Jacqueline Thornton had no medications administered during this visit.  See the medical record for exact dosing, route, and time of administration.  Follow-up plan:   No follow-ups on  file.       Interventional Therapies  Risk  Complexity Considerations:   Estimated body mass index is 28.32 kg/m as calculated from the following:   Height as of this encounter: 5' (1.524 m).   Weight as of this encounter: 145 lb (65.8 kg). WNL   Planned  Pending:   (L) L-FCT RFA   Under consideration:   Diagnostic caudal diagnostic epidurogram Diagnostic CESI  Diagnostic bilateral cervical facet block  Possible bilateral cervical facet RFA  Diagnostic left L4-5 LESI  Diagnostic left S1 SNRB    Completed:   Diagnostic bilateral L4 TFESI x1 (01/08/2022) (100/25/0)  Therapeutic right Racz procedure x1 (08/17/2020) (04/09/84/85) (100% relief of the right LEP) Diagnostic caudal ESI x1 (07/11/2020) (100/100/0/0)  Palliative left L5-S1 LESI x2 (07/27/2015) (70/70/0/0)  Diagnostic left lumbar facet MBB x6 (05/14/2018) (n/a)  Palliative right lumbar facet MBB x9 (06/13/2020) (100/100/30 x3 days/>50)  Therapeutic right shoulder joint inj. x1 (02/29/2016) (90/80/30)  Therapeutic left IA hip injection x3 (06/13/2020) (100/100/30/>50)  Diagnostic right SI joint Blk x3 (06/17/2017) (100/90/80/80)  Diagnostic/therapeutic left SI joint Blk x4 (06/17/2017) (100/90/80/80)  Palliative bilateral trochanteric bursa inj. x1 (12/11/2016) (100/100/90/90)  Therapeutic right lumbar facet RFA x2 (01/11/2021) (100/100/70)  Therapeutic left lumbar facet RFA x2 (12/19/2020) (100/100/75/>75) Therapeutic right SI joint RFA x1 (08/07/2017) (100/100/80)  Therapeutic left SI joint RFA x1 (09/16/2017) (100/100/100)    Therapeutic  Palliative (PRN) options:   Palliative Racz procedure  Palliative bilateral lumbar facet block  Palliative bilateral SI joint block  Palliative right  trochanteric bursa injection      Recent Visits Date Type Provider Dept  01/22/22 Office Visit Milinda Pointer, MD [facility]  01/08/22 Procedure visit Milinda Pointer, MD [facility]  12/19/21 Office Visit Milinda Pointer, MD [facility]  Showing recent visits within past 90 days and meeting all other requirements Future Appointments Date Type Provider Dept  02/07/22 Appointment Milinda Pointer, MD [facility]  04/03/22 Appointment Milinda Pointer, MD [facility]  Showing future appointments within next 90 days and meeting all other requirements  Disposition: Discharge home  Discharge (Date  Time): 02/07/2022;   hrs.   Primary Care Physician: Perrin Maltese, MD Location: [facility] Note by: Gaspar Cola, MD Date: 02/07/2022; Time: 5:50 AM  Disclaimer:  Medicine is not an Chief Strategy Officer. The only guarantee in medicine is that nothing is guaranteed. It is important to note that the decision to proceed with this intervention was based on the information collected from the patient. The Data and conclusions were drawn from the patient's questionnaire, the interview, and the physical examination. Because the information was provided in large part by the patient, it cannot be guaranteed that it has not been purposely or unconsciously manipulated. Every effort has been made to obtain as much relevant data as possible for this evaluation. It is important to note that the conclusions that lead to this procedure are derived in large part from the available data. Always take into account that the treatment will also be dependent on availability of resources and existing treatment guidelines, considered by other Pain Management Practitioners as being common knowledge and practice, at the time of the intervention. For Medico-Legal purposes, it is also important to point out that variation in procedural  techniques and pharmacological choices are the acceptable norm. The indications, contraindications, technique, and results of the above procedure should only be interpreted and judged by a Board-Certified Interventional Pain Specialist with extensive  familiarity and expertise in the same exact procedure and technique.

## 2022-02-07 ENCOUNTER — Ambulatory Visit
Admission: RE | Admit: 2022-02-07 | Discharge: 2022-02-07 | Disposition: A | Discharge status: Home / Self Care | Payer: Medicare Other | Source: Ambulatory Visit | Attending: Pain Medicine | Admitting: Pain Medicine

## 2022-02-07 ENCOUNTER — Other Ambulatory Visit: Payer: Self-pay

## 2022-02-07 ENCOUNTER — Ambulatory Visit: Payer: Medicare Other | Attending: Pain Medicine | Admitting: Pain Medicine

## 2022-02-07 ENCOUNTER — Encounter: Payer: Self-pay | Admitting: Pain Medicine

## 2022-02-07 VITALS — BP 128/54 | HR 70 | Temp 97.7°F | Resp 15 | Ht 60.0 in | Wt 143.0 lb

## 2022-02-07 DIAGNOSIS — M545 Low back pain, unspecified: Secondary | ICD-10-CM | POA: Insufficient documentation

## 2022-02-07 DIAGNOSIS — M5136 Other intervertebral disc degeneration, lumbar region: Secondary | ICD-10-CM | POA: Insufficient documentation

## 2022-02-07 DIAGNOSIS — M47817 Spondylosis without myelopathy or radiculopathy, lumbosacral region: Secondary | ICD-10-CM | POA: Insufficient documentation

## 2022-02-07 DIAGNOSIS — M47816 Spondylosis without myelopathy or radiculopathy, lumbar region: Secondary | ICD-10-CM | POA: Insufficient documentation

## 2022-02-07 DIAGNOSIS — G8929 Other chronic pain: Secondary | ICD-10-CM | POA: Diagnosis present

## 2022-02-07 MED ORDER — ROPIVACAINE HCL 2 MG/ML IJ SOLN
INTRAMUSCULAR | Status: AC
  Filled 2022-02-07: qty 20

## 2022-02-07 MED ORDER — TRIAMCINOLONE ACETONIDE 40 MG/ML IJ SUSP
80.0000 mg | Freq: Once | INTRAMUSCULAR | Status: AC
  Administered 2022-02-07: 80 mg

## 2022-02-07 MED ORDER — MIDAZOLAM HCL 5 MG/5ML IJ SOLN
INTRAMUSCULAR | Status: AC
  Filled 2022-02-07: qty 5

## 2022-02-07 MED ORDER — LIDOCAINE HCL 2 % IJ SOLN
INTRAMUSCULAR | Status: AC
  Filled 2022-02-07: qty 20

## 2022-02-07 MED ORDER — FENTANYL CITRATE (PF) 100 MCG/2ML IJ SOLN
25.0000 ug | INTRAMUSCULAR | Status: DC | PRN
  Administered 2022-02-07: 50 ug via INTRAVENOUS

## 2022-02-07 MED ORDER — LACTATED RINGERS IV SOLN: Freq: Once | INTRAVENOUS | Status: AC

## 2022-02-07 MED ORDER — TRIAMCINOLONE ACETONIDE 40 MG/ML IJ SUSP
INTRAMUSCULAR | Status: AC
  Filled 2022-02-07: qty 1

## 2022-02-07 MED ORDER — LIDOCAINE HCL 2 % IJ SOLN
20.0000 mL | Freq: Once | INTRAMUSCULAR | Status: AC
  Administered 2022-02-07: 400 mg

## 2022-02-07 MED ORDER — MIDAZOLAM HCL 5 MG/5ML IJ SOLN
0.5000 mg | Freq: Once | INTRAMUSCULAR | Status: AC
  Administered 2022-02-07: 2 mg via INTRAVENOUS
  Administered 2022-02-07: 1 mg via INTRAVENOUS

## 2022-02-07 MED ORDER — ROPIVACAINE HCL 2 MG/ML IJ SOLN
18.0000 mL | Freq: Once | INTRAMUSCULAR | Status: AC
  Administered 2022-02-07: 18 mL via PERINEURAL

## 2022-02-07 MED ORDER — FENTANYL CITRATE (PF) 100 MCG/2ML IJ SOLN
INTRAMUSCULAR | Status: AC
  Filled 2022-02-07: qty 2

## 2022-02-07 NOTE — Progress Notes (Signed)
Safety precautions to be maintained throughout the outpatient stay will include: orient to surroundings, keep bed in low position, maintain call bell within reach at all times, provide assistance with transfer out of bed and ambulation.  

## 2022-02-07 NOTE — Patient Instructions (Signed)
____________________________________________________________________________________________  Virtual Visits   What is a "Virtual Visit"? It is a healthcare communication encounter (medical visit) that takes place on real time (NOT TEXT or E-MAIL) over the telephone or computer device (desktop, laptop, tablet, smart phone, etc.). It allows for more location flexibility between the patient and the healthcare provider.  Who decides when these types of visits will be used? The physician.  Who is eligible for these types of visits? Only those patients that can be reliably reached over the telephone.  What do you mean by reliably? We do not have time to call everyone multiple times, therefore those that tend to screen calls and then call back later are not suitable candidates for this system. We understand how people are reluctant to pickup on "unknown" calls, therefore, we suggest adding our telephone numbers to your list of "CONTACT(s)". This way, you should be able to readily identify our calls when you receive one. All of our numbers are available below.   Who is not eligible? This option is not available for medication management encounters, specially for controlled substances. Patients on pain medications that fall under the category of controlled substances have to come in for "Face-to-Face" encounters. This is required for mandatory monitoring of these substances. You may be asked to provide a sample for an unannounced urine drug screening test (UDS), and we will need to count your pain pills. Not bringing your pills to be counted may result in no refill. Obviously, neither one of these can be done over the phone.  When will this type of visits be used? You can request a virtual visit whenever you are physically unable to attend a regular appointment. The decision will be made by the physician (or healthcare provider) on a case by case basis.   At what time will I be called? This is an  excellent question. The providers will try to call you whenever they have time available. Do not expect to be called at any specific time. The secretaries will assign you a time for your virtual visit appointment, but this is done simply to keep a list of those patients that need to be called, but not for the purpose of keeping a time schedule. Be advised that the call may come in anytime during the day, between the hours of 8:00 AM and 8::00 PM, depending on provider availability. We do understand that the system is not perfect. If you are unable to be available that day on a moments notice, then request an "in-person" appointment rather than a "virtual visit".  Can I request my medication visits to be "Virtual"? Yes you may request it, but the decision is entirely up to the healthcare provider. Control substances require specific monitoring that requires Face-to-Face encounters. The number of encounters  and the extent of the monitoring is determined on a case by case basis.  Add a new contact to your smart phone and label it "PAIN CLINIC" Under this contact add the following numbers: Main: [phone] (Official Contact Number) Nurses: [phone] (These are outgoing only calling systems. Do not call this number.) Dr. Aniel Hubble: [phone] or [phone] (Outgoing calls only. Do not call this number.)  ____________________________________________________________________________________________    ____________________________________________________________________________________________  Post-Procedure Discharge Instructions  Instructions: Apply ice:  Purpose: This will minimize any swelling and discomfort after procedure.  When: Day of procedure, as soon as you get home. How: Fill a plastic sandwich bag with crushed ice. Cover it with a small towel and apply   to injection site. How long: (15 min on, 15 min off) Apply for 15 minutes then remove x 15 minutes.  Repeat sequence on  day of procedure, until you go to bed. Apply heat:  Purpose: To treat any soreness and discomfort from the procedure. When: Starting the next day after the procedure. How: Apply heat to procedure site starting the day following the procedure. How long: May continue to repeat daily, until discomfort goes away. Food intake: Start with clear liquids (like water) and advance to regular food, as tolerated.  Physical activities: Keep activities to a minimum for the first 8 hours after the procedure. After that, then as tolerated. Driving: If you have received any sedation, be responsible and do not drive. You are not allowed to drive for 24 hours after having sedation. Blood thinner: (Applies only to those taking blood thinners) You may restart your blood thinner 6 hours after your procedure. Insulin: (Applies only to Diabetic patients taking insulin) As soon as you can eat, you may resume your normal dosing schedule. Infection prevention: Keep procedure site clean and dry. Shower daily and clean area with soap and water. Post-procedure Pain Diary: Extremely important that this be done correctly and accurately. Recorded information will be used to determine the next step in treatment. For the purpose of accuracy, follow these rules: Evaluate only the area treated. Do not report or include pain from an untreated area. For the purpose of this evaluation, ignore all other areas of pain, except for the treated area. After your procedure, avoid taking a long nap and attempting to complete the pain diary after you wake up. Instead, set your alarm clock to go off every hour, on the hour, for the initial 8 hours after the procedure. Document the duration of the numbing medicine, and the relief you are getting from it. Do not go to sleep and attempt to complete it later. It will not be accurate. If you received sedation, it is likely that you were given a medication that may cause amnesia. Because of this,  completing the diary at a later time may cause the information to be inaccurate. This information is needed to plan your care. Follow-up appointment: Keep your post-procedure follow-up evaluation appointment after the procedure (usually 2 weeks for most procedures, 6 weeks for radiofrequencies). DO NOT FORGET to bring you pain diary with you.   Expect: (What should I expect to see with my procedure?) From numbing medicine (AKA: Local Anesthetics): Numbness or decrease in pain. You may also experience some weakness, which if present, could last for the duration of the local anesthetic. Onset: Full effect within 15 minutes of injected. Duration: It will depend on the type of local anesthetic used. On the average, 1 to 8 hours.  From steroids (Applies only if steroids were used): Decrease in swelling or inflammation. Once inflammation is improved, relief of the pain will follow. Onset of benefits: Depends on the amount of swelling present. The more swelling, the longer it will take for the benefits to be seen. In some cases, up to 10 days. Duration: Steroids will stay in the system x 2 weeks. Duration of benefits will depend on multiple posibilities including persistent irritating factors. Side-effects: If present, they may typically last 2 weeks (the duration of the steroids). Frequent: Cramps (if they occur, drink Gatorade and take over-the-counter Magnesium 450-500 mg once to twice a day); water retention with temporary weight gain; increases in blood sugar; decreased immune system response; increased appetite. Occasional: Facial flushing (  red, warm cheeks); mood swings; menstrual changes. Uncommon: Long-term decrease or suppression of natural hormones; bone thinning. (These are more common with higher doses or more frequent use. This is why we prefer that our patients avoid having any injection therapies in other practices.)  Very Rare: Severe mood changes; psychosis; aseptic necrosis. From  procedure: Some discomfort is to be expected once the numbing medicine wears off. This should be minimal if ice and heat are applied as instructed.  Call if: (When should I call?) You experience numbness and weakness that gets worse with time, as opposed to wearing off. New onset bowel or bladder incontinence. (Applies only to procedures done in the spine)  Emergency Numbers: Durning business hours (Monday - Thursday, 8:00 AM - 4:00 PM) (Friday, 9:00 AM - 12:00 Noon): [phone] After hours: [phone] NOTE: If you are having a problem and are unable connect with, or to talk to a provider, then go to your nearest urgent care or emergency department. If the problem is serious and urgent, please call 911. ____________________________________________________________________________________________    

## 2022-02-08 ENCOUNTER — Telehealth: Payer: Self-pay

## 2022-02-08 NOTE — Telephone Encounter (Signed)
Post procedure phone call.  Call cant be completed per message.

## 2022-02-24 NOTE — Progress Notes (Unsigned)
Unsuccessful attempt to contact patient for Virtual Visit (Pain Management Telehealth)   Patient provided contact information:  [phone] (home); [phone] (mobile); (Preferred) [phone] [email or web address]   Pre-screening:  Our staff was successful in contacting [name] using the above provided information.   I unsuccessfully attempted to make contact with [name] on 3 different occasions on 02/26/2022 via telephone. I was unable to complete the virtual encounter due to no one answering the phone. I was unable to leave a message.  The information below was obtained during the nursing precharting assessment:   Post-procedure evaluation   Type: Lumbar Facet, Medial Branch Block(s)  L7  R10 (1st of 2023)   Laterality: Bilateral  Level: L2, L3, L4, L5, & S1 Medial Branch Level(s). Injecting these levels blocks the L3-4, L4-5 and L5-S1 lumbar facet joints. Imaging: Fluoroscopic guidance         Anesthesia: Local anesthesia (1-2% Lidocaine) Anxiolysis: IV Versed         Sedation: Moderate conscious sedation. DOS: 02/07/2022 Performed by: Gaspar Cola, MD  Primary Purpose: Diagnostic/Therapeutic Indications: Low back pain severe enough to impact quality of life or function. 1. Lumbar facet syndrome (Bilateral) (R>L)   2. Spondylosis without myelopathy or radiculopathy, lumbosacral region   3. Lumbar facet hypertrophy   4. DDD (degenerative disc disease), lumbar   5. Chronic low back pain (Bilateral) (R>L) w/o sciatica    NAS-11 Pain score:   Pre-procedure: 5 /10   Post-procedure: 0-No pain/10      Effectiveness:  Initial hour after procedure: 20 %. Subsequent 4-6 hours post-procedure: 20 %. Analgesia past initial 6 hours: 0 %  Pharmacotherapy Assessment  Analgesic: Oxycodone/APAP 7.5/325, 1 tab PO q 5 times a day. (56.25 mg/day of oxycodone) MME/day: 56.25 mg/day.   Follow-up plan:   Reschedule Visit.    Interventional Therapies  Risk   Complexity Considerations:   Estimated body mass index is 28.32 kg/m as calculated from the following:   Height as of this encounter: 5' (1.524 m).   Weight as of this encounter: 145 lb (65.8 kg). WNL   Planned  Pending:      Under consideration:   Diagnostic caudal diagnostic epidurogram Diagnostic CESI  Diagnostic bilateral cervical facet block  Possible bilateral cervical facet RFA  Diagnostic left L4-5 LESI  Diagnostic left S1 SNRB    Completed:   Diagnostic bilateral L4 TFESI x1 (01/08/2022) (100/25/0)  Therapeutic right Racz procedure x1 (08/17/2020) (04/09/84/85) (100% relief of the right LEP) Diagnostic caudal ESI x1 (07/11/2020) (100/100/0/0)  Palliative left L5-S1 LESI x2 (07/27/2015) (70/70/0/0)  Diagnostic left lumbar facet MBB x7 (02/07/2022) (20/20/0/0)  Palliative right lumbar facet MBB x10 (02/07/2022) (20/20/0/0)  Therapeutic right shoulder joint inj. x1 (02/29/2016) (90/80/30)  Therapeutic left IA hip injection x3 (06/13/2020) (100/100/30/>50)  Diagnostic right SI joint Blk x3 (06/17/2017) (100/90/80/80)  Diagnostic/therapeutic left SI joint Blk x4 (06/17/2017) (100/90/80/80)  Palliative bilateral trochanteric bursa inj. x1 (12/11/2016) (100/100/90/90)  Therapeutic right lumbar facet RFA x2 (01/11/2021) (100/100/70)  Therapeutic left lumbar facet RFA x2 (12/19/2020) (100/100/75/>75) Therapeutic right SI joint RFA x1 (08/07/2017) (100/100/80)  Therapeutic left SI joint RFA x1 (09/16/2017) (100/100/100)    Therapeutic  Palliative (PRN) options:   Palliative Racz procedure  Palliative bilateral SI joint block  Palliative right trochanteric bursa injection     Recent Visits Date Type Provider Dept  02/07/22 Procedure visit Milinda Pointer, MD [facility]  01/22/22 Office Visit Milinda Pointer, MD [facility]  01/08/22 Procedure visit Milinda Pointer, MD  [facility]  12/19/21 Office Visit Milinda Pointer, MD [facility]  Showing recent visits within past 90 days and meeting all other requirements Today's Visits Date Type Provider Dept  02/26/22 Office Visit Milinda Pointer, MD [facility]  Showing today's visits and meeting all other requirements Future Appointments Date Type Provider Dept  04/03/22 Appointment Milinda Pointer, MD [facility]  Showing future appointments within next 90 days and meeting all other requirements   Note by: Gaspar Cola, MD Date: 02/26/2022; Time: 5:13 PM

## 2022-02-25 ENCOUNTER — Telehealth: Payer: Self-pay | Admitting: *Deleted

## 2022-02-25 ENCOUNTER — Encounter: Payer: Self-pay | Admitting: Pain Medicine

## 2022-02-25 NOTE — Telephone Encounter (Signed)
No additional notes needed  

## 2022-02-26 ENCOUNTER — Ambulatory Visit: Payer: Medicare Other | Attending: Pain Medicine | Admitting: Pain Medicine

## 2022-02-26 DIAGNOSIS — M47816 Spondylosis without myelopathy or radiculopathy, lumbar region: Secondary | ICD-10-CM

## 2022-02-26 DIAGNOSIS — M545 Low back pain, unspecified: Secondary | ICD-10-CM

## 2022-02-26 DIAGNOSIS — G8929 Other chronic pain: Secondary | ICD-10-CM

## 2022-02-26 DIAGNOSIS — M25551 Pain in right hip: Secondary | ICD-10-CM

## 2022-02-26 DIAGNOSIS — M961 Postlaminectomy syndrome, not elsewhere classified: Secondary | ICD-10-CM

## 2022-02-26 DIAGNOSIS — R937 Abnormal findings on diagnostic imaging of other parts of musculoskeletal system: Secondary | ICD-10-CM

## 2022-02-26 DIAGNOSIS — M4316 Spondylolisthesis, lumbar region: Secondary | ICD-10-CM

## 2022-02-26 DIAGNOSIS — M25552 Pain in left hip: Secondary | ICD-10-CM

## 2022-02-28 ENCOUNTER — Ambulatory Visit: Payer: Self-pay

## 2022-02-28 NOTE — Patient Outreach (Signed)
  Care Coordination   Initial Visit Note   02/28/2022 Name: Jacqueline Thornton MRN: [id number] DOB: 07-Feb-1951  Jacqueline Thornton is a 70 y.o. year old female who sees Perrin Maltese, MD for primary care. I spoke with  Jacqueline Thornton by phone today.  What matters to the patients health and wellness today?  Patient denies any care coordination needs. Declines Care Coordination services at this time.    Goals Addressed             This Visit's Progress    COMPLETED: Care Coordination Activities-no follow up required       Care Coordination Interventions: Discussed Care Coordination services Advised patient to contact RN care coordinator and/or primary care provider if care coordination needs change Assessed social determinant of health barriers Spoke with patient regarding vaccinations: shingrix (patient reports she needs her second shot. Encouraged patient to discuss at next office visit Discussed Annual Wellness visit and benefit. Encouraged to contact primary care provider to schedule.        SDOH assessments and interventions completed:  Yes  SDOH Interventions Today    Flowsheet Row Most Recent Value  SDOH Interventions   Food Insecurity Interventions Intervention Not Indicated  Transportation Interventions Intervention Not Indicated        Care Coordination Interventions Activated:  Yes  Care Coordination Interventions:  Yes, provided   Follow up plan: No further intervention required.   Encounter Outcome:  Pt. Visit Completed   Thea Silversmith, RN, MSN, BSN, CCM Care Coordinator [phone]

## 2022-03-05 ENCOUNTER — Ambulatory Visit: Payer: Medicare Other | Admitting: Anesthesiology

## 2022-03-05 ENCOUNTER — Encounter: Payer: Self-pay | Admitting: *Deleted

## 2022-03-05 ENCOUNTER — Other Ambulatory Visit: Payer: Self-pay

## 2022-03-05 ENCOUNTER — Encounter
Admission: RE | Disposition: A | Discharge status: Home / Self Care | Payer: Self-pay | Source: Home / Self Care | Attending: Gastroenterology

## 2022-03-05 ENCOUNTER — Ambulatory Visit
Admission: RE | Admit: 2022-03-05 | Discharge: 2022-03-05 | Disposition: A | Discharge status: Home / Self Care | Payer: Medicare Other | Attending: Gastroenterology | Admitting: Gastroenterology

## 2022-03-05 DIAGNOSIS — I10 Essential (primary) hypertension: Secondary | ICD-10-CM | POA: Diagnosis not present

## 2022-03-05 DIAGNOSIS — Z90710 Acquired absence of both cervix and uterus: Secondary | ICD-10-CM | POA: Diagnosis not present

## 2022-03-05 DIAGNOSIS — Z800 Family history of malignant neoplasm of digestive organs: Secondary | ICD-10-CM | POA: Insufficient documentation

## 2022-03-05 DIAGNOSIS — E785 Hyperlipidemia, unspecified: Secondary | ICD-10-CM | POA: Insufficient documentation

## 2022-03-05 DIAGNOSIS — Z86718 Personal history of other venous thrombosis and embolism: Secondary | ICD-10-CM | POA: Diagnosis not present

## 2022-03-05 DIAGNOSIS — Z87891 Personal history of nicotine dependence: Secondary | ICD-10-CM | POA: Insufficient documentation

## 2022-03-05 DIAGNOSIS — G8929 Other chronic pain: Secondary | ICD-10-CM | POA: Diagnosis not present

## 2022-03-05 DIAGNOSIS — D509 Iron deficiency anemia, unspecified: Secondary | ICD-10-CM | POA: Diagnosis present

## 2022-03-05 HISTORY — PX: COLONOSCOPY WITH PROPOFOL: SHX5780

## 2022-03-05 SURGERY — COLONOSCOPY WITH PROPOFOL
Anesthesia: General

## 2022-03-05 MED ORDER — SODIUM CHLORIDE 0.9 % IV SOLN: INTRAVENOUS | Status: DC

## 2022-03-05 MED ORDER — PROPOFOL 500 MG/50ML IV EMUL
INTRAVENOUS | Status: DC | PRN
  Administered 2022-03-05: 165 ug/kg/min via INTRAVENOUS

## 2022-03-05 MED ORDER — PROPOFOL 10 MG/ML IV BOLUS
INTRAVENOUS | Status: DC | PRN
  Administered 2022-03-05: 70 mg via INTRAVENOUS

## 2022-03-05 MED ORDER — LIDOCAINE HCL (CARDIAC) PF 100 MG/5ML IV SOSY
PREFILLED_SYRINGE | INTRAVENOUS | Status: DC | PRN
  Administered 2022-03-05: 50 mg via INTRAVENOUS

## 2022-03-05 NOTE — Transfer of Care (Signed)
Immediate Anesthesia Transfer of Care Note  Patient: Jacqueline Thornton  Procedure(s) Performed: COLONOSCOPY WITH PROPOFOL  Patient Location: Endoscopy Unit  Anesthesia Type:General  Level of Consciousness: awake  Airway & Oxygen Therapy: Patient Spontanous Breathing  Post-op Assessment: Report given to RN and Post -op Vital signs reviewed and stable  Post vital signs: Reviewed and stable  Last Vitals:  Vitals Value Taken Time  BP 131/57 03/05/22 1628  Temp 36.1 C 03/05/22 1626  Pulse 82 03/05/22 1629  Resp 18 03/05/22 1629  SpO2 99 % 03/05/22 1629  Vitals shown include unvalidated device data.  Last Pain:  Vitals:   03/05/22 1626  TempSrc: Temporal  PainSc: 0-No pain         Complications: No notable events documented.

## 2022-03-05 NOTE — Anesthesia Procedure Notes (Signed)
Date/Time: 03/05/2022 4:06 PM  Performed by: Loletha Grayer, CRNAPre-anesthesia Checklist: Patient identified, Emergency Drugs available, Suction available, Patient being monitored and Timeout performed Patient Re-evaluated:Patient Re-evaluated prior to induction Oxygen Delivery Method: Simple face mask

## 2022-03-05 NOTE — Interval H&P Note (Signed)
History and Physical Interval Note:  03/05/2022 3:59 PM  Jacqueline Thornton  has presented today for surgery, with the diagnosis of Z86.010  - Hx of adenomatous colonic polyps.  The various methods of treatment have been discussed with the patient and family. After consideration of risks, benefits and other options for treatment, the patient has consented to  Procedure(s): COLONOSCOPY WITH PROPOFOL (N/A) as a surgical intervention.  The patient's history has been reviewed, patient examined, no change in status, stable for surgery.  I have reviewed the patient's chart and labs.  Questions were answered to the patient's satisfaction.     Lesly Rubenstein  Ok to proceed with colonoscopy

## 2022-03-05 NOTE — H&P (Signed)
Outpatient short stay form Pre-procedure 03/05/2022  Lesly Rubenstein, MD  Primary Physician: Perrin Maltese, MD  Reason for visit:  IDA  History of present illness:    71 y/o lady with history of chronic pain on opioids and HLD here for colonoscopy for IDA. Last colonoscopy 6 months ago with poor prep. History of hysterectomy. No blood thinners. Mother had colon cancer in her 64's.    Current Facility-Administered Medications:    0.9 %  sodium chloride infusion, , Intravenous, Continuous, Maalik Pinn, Hilton Cork, MD, Last Rate: 20 mL/hr at 03/05/22 1420, New Bag at 03/05/22 1420  Medications Prior to Admission  Medication Sig Dispense Refill Last Dose   atorvastatin (LIPITOR) 20 MG tablet Take 20 mg by mouth daily.   03/04/2022   CALCIUM PO Take 1 tablet by mouth daily.   Past Week   DULoxetine (CYMBALTA) 60 MG capsule Take 60 mg by mouth at bedtime.    03/04/2022   enalapril (VASOTEC) 5 MG tablet Take 5 mg by mouth daily.   03/04/2022   estradiol (ESTRACE) 0.5 MG tablet Take 0.5 mg by mouth at bedtime.    03/04/2022   furosemide (LASIX) 20 MG tablet Take 20 mg by mouth daily.   03/04/2022   ibandronate (BONIVA) 3 MG/3ML SOLN injection Inject into the vein.   03/04/2022   Melatonin 10 MG TABS Take 10 mg by mouth at bedtime as needed (for sleep).   03/04/2022   meloxicam (MOBIC) 15 MG tablet Take 15 mg by mouth daily.   03/04/2022   methocarbamol (ROBAXIN) 500 MG tablet Take 500 mg by mouth every 8 (eight) hours as needed for muscle spasms.   03/05/2022 at 0800   Multiple Vitamin (MULTIVITAMIN WITH MINERALS) TABS tablet Take 1 tablet by mouth daily.   Past Week   omeprazole (PRILOSEC) 40 MG capsule Take by mouth.   03/04/2022   oxyCODONE-acetaminophen (PERCOCET) 7.5-325 MG tablet Take 1 tablet by mouth 5 (five) times daily. Must last 30 days 150 tablet 0 03/05/2022 at 0800   rosuvastatin (CRESTOR) 10 MG tablet Take 1 tablet by mouth daily.   03/04/2022   spironolactone (ALDACTONE) 25 MG tablet Take 12.5 mg by  mouth daily.   03/04/2022   Cyanocobalamin (VITAMIN B-12) 5000 MCG SUBL Place 1 tablet (5,000 mcg total) under the tongue daily. 30 tablet 2    gabapentin (NEURONTIN) 300 MG capsule Take 1 capsule (300 mg total) by mouth 2 (two) times daily. 60 capsule 2    oxyCODONE-acetaminophen (PERCOCET) 7.5-325 MG tablet Take 1 tablet by mouth 5 (five) times daily. Must last 30 days 150 tablet 0    [START ON 03/12/2022] oxyCODONE-acetaminophen (PERCOCET) 7.5-325 MG tablet Take 1 tablet by mouth 5 (five) times daily. 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    Physical Exam  Gen: Alert, oriented. Appears stated age.  HEENT: PERRLA. Lungs: No respiratory distress CV: RRR Abd: soft, benign, no masses Ext: No edema    Planned procedures: Proceed with colonoscopy. The patient understands the nature of the planned procedure, indications, risks, alternatives and potential complications including but not limited to bleeding, infection, perforation, damage to internal organs and possible oversedation/side effects from anesthesia. The patient agrees and gives consent to proceed.   Please refer to procedure notes for findings, recommendations and patient disposition/instructions.     Lesly Rubenstein, MD [facility]

## 2022-03-05 NOTE — Op Note (Addendum)
[facility] Gastroenterology Patient Name: Jacqueline Thornton Procedure Date: 03/05/2022 4:03 PM MRN: [id number] Account #: [id number] Date of Birth: [birth year] Admit Type: Outpatient Age: 71 Room: [facility] ENDO ROOM 1 Gender: Female Note Status: Supervisor Override Instrument Name: Peds Colonoscope [id number] Procedure:             Colonoscopy Indications:           Iron deficiency anemia, Personal history of colonic                         polyps Providers:             Andrey Farmer MD, MD Medicines:             Monitored Anesthesia Care Complications:         No immediate complications. Procedure:             Pre-Anesthesia Assessment:                        - Prior to the procedure, a History and Physical was                         performed, and patient medications and allergies were                         reviewed. The patient is competent. The risks and                         benefits of the procedure and the sedation options and                         risks were discussed with the patient. All questions                         were answered and informed consent was obtained.                         Patient identification and proposed procedure were                         verified by the physician, the nurse, the                         anesthesiologist, the anesthetist and the technician                         in the endoscopy suite. Mental Status Examination:                         alert and oriented. Airway Examination: normal                         oropharyngeal airway and neck mobility. Respiratory                         Examination: clear to auscultation. CV Examination:                         normal. Prophylactic Antibiotics: The patient does not  require prophylactic antibiotics. Prior                         Anticoagulants: The patient has taken no previous                         anticoagulant or antiplatelet agents. ASA  Grade                         Assessment: II - A patient with mild systemic disease.                         After reviewing the risks and benefits, the patient                         was deemed in satisfactory condition to undergo the                         procedure. The anesthesia plan was to use monitored                         anesthesia care (MAC). Immediately prior to                         administration of medications, the patient was                         re-assessed for adequacy to receive sedatives. The                         heart rate, respiratory rate, oxygen saturations,                         blood pressure, adequacy of pulmonary ventilation, and                         response to care were monitored throughout the                         procedure. The physical status of the patient was                         re-assessed after the procedure.                        After obtaining informed consent, the colonoscope was                         passed under direct vision. Throughout the procedure,                         the patient's blood pressure, pulse, and oxygen                         saturations were monitored continuously. The                         Colonoscope was introduced through the anus and  advanced to the the terminal ileum. The colonoscopy                         was somewhat difficult due to restricted mobility of                         the colon. The patient tolerated the procedure well.                         The quality of the bowel preparation was good. Findings:      The perianal and digital rectal examinations were normal.      The terminal ileum appeared normal.      The entire examined colon appeared normal on direct and retroflexion       views. Impression:            - The examined portion of the ileum was normal.                        - The entire examined colon is normal on direct and                          retroflexion views.                        - No specimens collected. Recommendation:        - Discharge patient to home.                        - Resume previous diet.                        - Continue present medications.                        - Repeat colonoscopy is not recommended due to current                         age (52 years or older) for screening purposes.                        - Return to referring physician as previously                         scheduled. Procedure Code(s):     --- Professional ---                        [phone], Colonoscopy, flexible; diagnostic, including                         collection of specimen(s) by brushing or washing, when                         performed (separate procedure) Diagnosis Code(s):     --- Professional ---                        D50.9, Iron deficiency anemia, unspecified CPT copyright 2019 [employer]. All rights reserved. The codes documented in this report are preliminary and upon coder review may  be revised to meet current compliance requirements. Andrey Farmer MD, MD 03/05/2022 4:25:27 PM Number of Addenda: 0 Note Initiated On: 03/05/2022 4:03 PM Scope Withdrawal Time: 0 hours 7 minutes 38 seconds  Total Procedure Duration: 0 hours 12 minutes 38 seconds  Estimated Blood Loss:  Estimated blood loss: none.      [facility]

## 2022-03-05 NOTE — Anesthesia Preprocedure Evaluation (Signed)
Anesthesia Evaluation  Patient identified by MRN, date of birth, ID band Patient awake    Reviewed: Allergy & Precautions, NPO status , Patient's Chart, lab work & pertinent test results  History of Anesthesia Complications Negative for: history of anesthetic complications  Airway Mallampati: III   Neck ROM: Full    Dental  (+) Implants   Pulmonary former smoker,    Pulmonary exam normal breath sounds clear to auscultation       Cardiovascular hypertension, Normal cardiovascular exam Rhythm:Regular Rate:Normal  ECG 05/04/21: Normal sinus rhythm Cannot rule out Anterior infarct , age undetermined   Neuro/Psych  Headaches, PSYCHIATRIC DISORDERS Anxiety Depression Chronic back pain    GI/Hepatic GERD  ,  Endo/Other  negative endocrine ROS  Renal/GU negative Renal ROS     Musculoskeletal  (+) Arthritis , Fibromyalgia -  Abdominal   Peds  Hematology  (+) Blood dyscrasia, anemia , Hx DVT 2019   Anesthesia Other Findings   Reproductive/Obstetrics                            Anesthesia Physical  Anesthesia Plan  ASA: 2  Anesthesia Plan: General   Post-op Pain Management:    Induction: Intravenous  PONV Risk Score and Plan: 3 and Propofol infusion, TIVA and Treatment may vary due to age or medical condition  Airway Management Planned: Natural Airway  Additional Equipment:   Intra-op Plan:   Post-operative Plan:   Informed Consent: I have reviewed the patients History and Physical, chart, labs and discussed the procedure including the risks, benefits and alternatives for the proposed anesthesia with the patient or authorized representative who has indicated his/her understanding and acceptance.     Dental advisory given  Plan Discussed with: CRNA  Anesthesia Plan Comments:         Anesthesia Quick Evaluation

## 2022-03-06 ENCOUNTER — Encounter: Payer: Self-pay | Admitting: Gastroenterology

## 2022-03-06 NOTE — Anesthesia Postprocedure Evaluation (Signed)
Anesthesia Post Note  Patient: Jacqueline Thornton  Procedure(s) Performed: COLONOSCOPY WITH PROPOFOL  Patient location during evaluation: PACU Anesthesia Type: General Level of consciousness: awake and alert Pain management: pain level controlled Vital Signs Assessment: post-procedure vital signs reviewed and stable Respiratory status: spontaneous breathing, nonlabored ventilation and respiratory function stable Cardiovascular status: blood pressure returned to baseline and stable Postop Assessment: no apparent nausea or vomiting Anesthetic complications: no   No notable events documented.   Last Vitals:  Vitals:   03/05/22 1628 03/05/22 1636  BP: (!) 131/57 (!) 149/71  Pulse: 84 86  Resp: 16 17  Temp:    SpO2: 100% 100%    Last Pain:  Vitals:   03/06/22 0824  TempSrc:   PainSc: 0-No pain                 [place]

## 2022-04-03 ENCOUNTER — Encounter: Payer: Self-pay | Admitting: Pain Medicine

## 2022-04-03 ENCOUNTER — Ambulatory Visit: Payer: Medicare Other | Attending: Pain Medicine | Admitting: Pain Medicine

## 2022-04-03 VITALS — BP 121/66 | HR 84 | Temp 96.9°F | Ht 60.0 in | Wt 143.0 lb

## 2022-04-03 DIAGNOSIS — Z79899 Other long term (current) drug therapy: Secondary | ICD-10-CM

## 2022-04-03 DIAGNOSIS — Z9189 Other specified personal risk factors, not elsewhere classified: Secondary | ICD-10-CM

## 2022-04-03 DIAGNOSIS — M5136 Other intervertebral disc degeneration, lumbar region: Secondary | ICD-10-CM | POA: Diagnosis present

## 2022-04-03 DIAGNOSIS — R937 Abnormal findings on diagnostic imaging of other parts of musculoskeletal system: Secondary | ICD-10-CM

## 2022-04-03 DIAGNOSIS — Z79891 Long term (current) use of opiate analgesic: Secondary | ICD-10-CM

## 2022-04-03 DIAGNOSIS — M5442 Lumbago with sciatica, left side: Secondary | ICD-10-CM | POA: Diagnosis present

## 2022-04-03 DIAGNOSIS — G894 Chronic pain syndrome: Secondary | ICD-10-CM

## 2022-04-03 DIAGNOSIS — M533 Sacrococcygeal disorders, not elsewhere classified: Secondary | ICD-10-CM | POA: Diagnosis present

## 2022-04-03 DIAGNOSIS — G8929 Other chronic pain: Secondary | ICD-10-CM | POA: Diagnosis present

## 2022-04-03 DIAGNOSIS — M542 Cervicalgia: Secondary | ICD-10-CM | POA: Diagnosis present

## 2022-04-03 DIAGNOSIS — M961 Postlaminectomy syndrome, not elsewhere classified: Secondary | ICD-10-CM

## 2022-04-03 DIAGNOSIS — M25552 Pain in left hip: Secondary | ICD-10-CM | POA: Insufficient documentation

## 2022-04-03 DIAGNOSIS — M25551 Pain in right hip: Secondary | ICD-10-CM

## 2022-04-03 DIAGNOSIS — M47816 Spondylosis without myelopathy or radiculopathy, lumbar region: Secondary | ICD-10-CM

## 2022-04-03 DIAGNOSIS — M51369 Other intervertebral disc degeneration, lumbar region without mention of lumbar back pain or lower extremity pain: Secondary | ICD-10-CM

## 2022-04-03 MED ORDER — OXYCODONE-ACETAMINOPHEN 7.5-325 MG PO TABS
1.0000 | ORAL_TABLET | Freq: Every day | ORAL | 0 refills | Status: DC
Start: 2022-05-11 — End: 2022-07-08

## 2022-04-03 MED ORDER — NALOXONE HCL 4 MG/0.1ML NA LIQD: 1.0000 | NASAL | 0 refills | Status: DC | PRN

## 2022-04-03 MED ORDER — OXYCODONE-ACETAMINOPHEN 7.5-325 MG PO TABS: 1.0000 | ORAL_TABLET | Freq: Every day | ORAL | 0 refills | Status: DC

## 2022-04-03 MED ORDER — OXYCODONE-ACETAMINOPHEN 7.5-325 MG PO TABS
1.0000 | ORAL_TABLET | Freq: Every day | ORAL | 0 refills | Status: DC
Start: 2022-04-11 — End: 2022-07-08

## 2022-04-03 NOTE — Patient Instructions (Signed)
____________________________________________________________________________________________  Pharmacy Shortages of Pain Medication   Introduction Shockingly as it may seem, .  "No [place] Supreme Court decision has ever interpreted the Constitution as guaranteeing a right to health care for all Americans." - https://www.healthequityandpolicylab.com/elusive-right-to-health-care-under-us-law  "With respect to human rights, the [place] has no formally codified right to health, nor does it participate in a human rights treaty that specifies a right to health." - Scott J. Schweikart, JD, MBE  Situation By now, most of our patients have had the experience of being told by their pharmacist that they do not have enough medication to cover their prescription. If you have not had this experience, just know that you soon will.  Problem There appears to be a shortage of these medications, either at the national level or locally. This is happening with all pharmacies. When there is not enough medication, patients are offered a partial fill and they are told that they will try to get the rest of the medicine for them at a later time. If they do not have enough for even a partial fill, the pharmacists are telling the patients to call us (the prescribing physicians) to request that we send another prescription to another pharmacy to get the medicine.   This reordering of a controlled substance creates documentation problems where additional paperwork needs to be created to explain why two prescriptions for the same period of time and the same medicine are being prescribed to the same patient. It also creates situations where the last appointment note does not accurately reflect when and what prescriptions were given to a patient. This leads to prescribing errors down the line, in subsequent follow-up visits.   [place] Board of Pharmacy ([place]) Research revealed that Board of Pharmacy Rule .1806 (21  [place]AC 46.1806) authorizes pharmacists to the transfer of prescriptions among pharmacies, and it sets forth procedural and recordkeeping requirements for doing so. However, this requires the pharmacist to complete the previously mentioned procedural paperwork to accomplish the transfer. As it turns out, it is much easier for them to have the prescribing physicians do the work.   Possible solutions 1. You can ask your physician to assist you in weaning yourself off these medications. 2. Ask your pharmacy if the medication is in stock, 3 days prior to your refill. 3. If you need a pharmacy change, let us know at your medication management visit. Prescriptions that have already been electronically sent to a pharmacy will not be re-sent to a different pharmacy if your pharmacy of record does not have it in stock. Proper stocking of medication is a pharmacy problem, not a prescriber problem. Work with your pharmacist to solve the problem. 4. Have the [place] Assembly add a provision to the "STOP ACT" (the law that mandates how controlled substances are prescribed) where there is an exception to the electronic prescribing rule that states that in the event there are shortages of medications the physicians are allowed to use written prescriptions as opposed to electronic ones. This would allow patients to take their prescriptions to a different pharmacy that may have enough medication available to fill the prescription. The problem is that currently there is a law that does not allow for written prescriptions, with the exception of instances where the electronic medical record is down due to technical issues.  5. Have [place] Congress ease the pressure on pharmaceutical companies, allowing them to produce enough quantities of the medication to adequately supply the population. 6. Have pharmacies keep enough   stocks of these medications to cover their client base.  7. Have the [place] Assembly add  a provision to the "STOP ACT" where they ease the regulations surrounding the transfer of controlled substances between pharmacies, so as to simplify the transfer of supplies. As an alternative, develop a system to allow patients to obtain the remainder of their prescription at another one of their pharmacies or at an associate pharmacy.   How this shortage will affect you.  Understand that this is a pharmacy supply problem, not a prescriber problem. Work with your pharmacy to solve it. The job of the prescriber is to evaluate and monitor the patient for the appropriate indications and use of these medicines. It is not the job of the prescriber to supply the medication or to solve problems with that supply. The responsibility and the choice to obtain the medication resides on the patient. By law, supplying the medication is the job of the pharmacy. It is certainly not the job of the prescriber to solve supply problems.   Due to the above problems we are no longer taking patients to write for their pain medication. Future discussions with your physician may include potentially weaning medications or transitioning to alternatives.  We will be focusing primarily on interventional based pain management. We will continue to evaluate for appropriate indications and we may provide recommendations regarding medication, dose, and schedule, as well as monitoring recommendations, however, we will not be taking over the actual prescribing of these substances. On those patients where we are treating their chronic pain with interventional therapies, exceptions will be considered on a case by case basis. At this time, we will try to continue providing this supplemental service to those patients we have been managing in the past. However, as of August 1st, 2023, we no longer will be sending additional prescriptions to other pharmacies for the purpose of solving their supply problems. Once we send a prescription to a pharmacy,  we will not be resending it again to another pharmacy to cover for their shortages.   What to do. Write as many letters as you can. Recruit the help of family members in writing these letters. Below are some of the places where you can write to make your voice heard. Let them know what the problem is and push them to look for solutions.   Search internet for: "[place] find your legislators" https://www.ncleg.gov/findyourlegislators  Search internet for: "[place] insurance commissioner complaints" https://www.ncdoi.gov/contactscomplaints/assistance-or-file-complaint  Search internet for: "[place] Board of Pharmacy complaints" http://www.ncbop.org/contact.htm  Search internet for: "[facility] pharmacy complaints" Email [facility] Customer Relations https://www.[facility].com/help/email-customer-relations.jsp?callType=store  Search internet for: "[facility] pharmacy customer service complaints" https://www.[facility].com/topic/marketing/contactus/contactus_customerservice.jsp  ____________________________________________________________________________________________  ____________________________________________________________________________________________  Medication Rules  Purpose: To inform patients, and their family members, of our rules and regulations.  Applies to: All patients receiving prescriptions (written or electronic).  Pharmacy of record: Pharmacy where electronic prescriptions will be sent. If written prescriptions are taken to a different pharmacy, please inform the nursing staff. The pharmacy listed in the electronic medical record should be the one where you would like electronic prescriptions to be sent.  Electronic prescriptions: In compliance with the [place] Strengthen Opioid Misuse Prevention (STOP) Act of 2017 (Session Law 2017-74/H243), effective July 01, 2018, all controlled substances must be electronically prescribed. Calling prescriptions  to the pharmacy will cease to exist.  Prescription refills: Only during scheduled appointments. Applies to all prescriptions.  NOTE: The following applies primarily to controlled substances (Opioid* Pain Medications).     Type of encounter (visit): For patients receiving controlled substances, face-to-face visits are required. (Not an option or up to the patient.)  Patient's responsibilities: Pain Pills: Bring all pain pills to every appointment (except for procedure appointments). Pill Bottles: Bring pills in original pharmacy bottle. Always bring the newest bottle. Bring bottle, even if empty. Medication refills: You are responsible for knowing and keeping track of what medications you take and those you need refilled. The day before your appointment: write a list of all prescriptions that need to be refilled. The day of the appointment: give the list to the admitting nurse. Prescriptions will be written only during appointments. No prescriptions will be written on procedure days. If you forget a medication: it will not be "Called in", "Faxed", or "electronically sent". You will need to get another appointment to get these prescribed. No early refills. Do not call asking to have your prescription filled early. Prescription Accuracy: You are responsible for carefully inspecting your prescriptions before leaving our office. Have the discharge nurse carefully go over each prescription with you, before taking them home. Make sure that your name is accurately spelled, that your address is correct. Check the name and dose of your medication to make sure it is accurate. Check the number of pills, and the written instructions to make sure they are clear and accurate. Make sure that you are given enough medication to last until your next medication refill appointment. Taking Medication: Take medication as prescribed. When it comes to controlled substances, taking less pills or less frequently than prescribed  is permitted and encouraged. Never take more pills than instructed. Never take medication more frequently than prescribed.  Inform other Doctors: Always inform, all of your healthcare providers, of all the medications you take. Pain Medication from other Providers: You are not allowed to accept any additional pain medication from any other Doctor or Healthcare provider. There are two exceptions to this rule. (see below) In the event that you require additional pain medication, you are responsible for notifying us, as stated below. Cough Medicine: Often these contain an opioid, such as codeine or hydrocodone. Never accept or take cough medicine containing these opioids if you are already taking an opioid* medication. The combination may cause respiratory failure and death. Medication Agreement: You are responsible for carefully reading and following our Medication Agreement. This must be signed before receiving any prescriptions from our practice. Safely store a copy of your signed Agreement. Violations to the Agreement will result in no further prescriptions. (Additional copies of our Medication Agreement are available upon request.) Laws, Rules, & Regulations: All patients are expected to follow all Federal and State Laws, Statutes, Rules, & Regulations. Ignorance of the Laws does not constitute a valid excuse.  Illegal drugs and Controlled Substances: The use of illegal substances (including, but not limited to marijuana and its derivatives) and/or the illegal use of any controlled substances is strictly prohibited. Violation of this rule may result in the immediate and permanent discontinuation of any and all prescriptions being written by our practice. The use of any illegal substances is prohibited. Adopted CDC guidelines & recommendations: Target dosing levels will be at or below 60 MME/day. Use of benzodiazepines** is not recommended.  Exceptions: There are only two exceptions to the rule of not  receiving pain medications from other Healthcare Providers. Exception #1 (Emergencies): In the event of an emergency (i.e.: accident requiring emergency care), you are allowed to receive additional pain medication. However, you are responsible for: As soon as   you are able, call our office [phone], at any time of the day or night, and leave a message stating your name, the date and nature of the emergency, and the name and dose of the medication prescribed. In the event that your call is answered by a member of our staff, make sure to document and save the date, time, and the name of the person that took your information.  Exception #2 (Planned Surgery): In the event that you are scheduled by another doctor or dentist to have any type of surgery or procedure, you are allowed (for a period no longer than 30 days), to receive additional pain medication, for the acute post-op pain. However, in this case, you are responsible for picking up a copy of our "Post-op Pain Management for Surgeons" handout, and giving it to your surgeon or dentist. This document is available at our office, and does not require an appointment to obtain it. Simply go to our office during business hours (Monday-Thursday from 8:00 AM to 4:00 PM) (Friday 8:00 AM to 12:00 Noon) or if you have a scheduled appointment with us, prior to your surgery, and ask for it by name. In addition, you are responsible for: calling our office [phone], at any time of the day or night, and leaving a message stating your name, name of your surgeon, type of surgery, and date of procedure or surgery. Failure to comply with your responsibilities may result in termination of therapy involving the controlled substances. Medication Agreement Violation. Following the above rules, including your responsibilities will help you in avoiding a Medication Agreement Violation ("Breaking your Pain Medication Contract").  *Opioid medications include: morphine,  codeine, oxycodone, oxymorphone, hydrocodone, hydromorphone, meperidine, tramadol, tapentadol, buprenorphine, fentanyl, methadone. **Benzodiazepine medications include: diazepam (Valium), alprazolam (Xanax), clonazepam (Klonopine), lorazepam (Ativan), clorazepate (Tranxene), chlordiazepoxide (Librium), estazolam (Prosom), oxazepam (Serax), temazepam (Restoril), triazolam (Halcion) (Last updated: 03/28/2021) ____________________________________________________________________________________________  ____________________________________________________________________________________________  Medication Recommendations and Reminders  Applies to: All patients receiving prescriptions (written and/or electronic).  Medication Rules & Regulations: These rules and regulations exist for your safety and that of others. They are not flexible and neither are we. Dismissing or ignoring them will be considered "non-compliance" with medication therapy, resulting in complete and irreversible termination of such therapy. (See document titled "Medication Rules" for more details.) In all conscience, because of safety reasons, we cannot continue providing a therapy where the patient does not follow instructions.  Pharmacy of record:  Definition: This is the pharmacy where your electronic prescriptions will be sent.  We do not endorse any particular pharmacy, however, we have experienced problems with [facility] not securing enough medication supply for the community. We do not restrict you in your choice of pharmacy. However, once we write for your prescriptions, we will NOT be re-sending more prescriptions to fix restricted supply problems created by your pharmacy, or your insurance.  The pharmacy listed in the electronic medical record should be the one where you want electronic prescriptions to be sent. If you choose to change pharmacy, simply notify our nursing staff.  Recommendations: Keep all of your pain  medications in a safe place, under lock and key, even if you live alone. We will NOT replace lost, stolen, or damaged medication. After you fill your prescription, take 1 week's worth of pills and put them away in a safe place. You should keep a separate, properly labeled bottle for this purpose. The remainder should be kept in the original bottle. Use this as your primary supply, until   it runs out. Once it's gone, then you know that you have 1 week's worth of medicine, and it is time to come in for a prescription refill. If you do this correctly, it is unlikely that you will ever run out of medicine. To make sure that the above recommendation works, it is very important that you make sure your medication refill appointments are scheduled at least 1 week before you run out of medicine. To do this in an effective manner, make sure that you do not leave the office without scheduling your next medication management appointment. Always ask the nursing staff to show you in your prescription , when your medication will be running out. Then arrange for the receptionist to get you a return appointment, at least 7 days before you run out of medicine. Do not wait until you have 1 or 2 pills left, to come in. This is very poor planning and does not take into consideration that we may need to cancel appointments due to bad weather, sickness, or emergencies affecting our staff. DO NOT ACCEPT A "Partial Fill": If for any reason your pharmacy does not have enough pills/tablets to completely fill or refill your prescription, do not allow for a "partial fill". The law allows the pharmacy to complete that prescription within 72 hours, without requiring a new prescription. If they do not fill the rest of your prescription within those 72 hours, you will need a separate prescription to fill the remaining amount, which we will NOT provide. If the reason for the partial fill is your insurance, you will need to talk to the pharmacist  about payment alternatives for the remaining tablets, but again, DO NOT ACCEPT A PARTIAL FILL, unless you can trust your pharmacist to obtain the remainder of the pills within 72 hours.  Prescription refills and/or changes in medication(s):  Prescription refills, and/or changes in dose or medication, will be conducted only during scheduled medication management appointments. (Applies to both, written and electronic prescriptions.) No refills on procedure days. No medication will be changed or started on procedure days. No changes, adjustments, and/or refills will be conducted on a procedure day. Doing so will interfere with the diagnostic portion of the procedure. No phone refills. No medications will be "called into the pharmacy". No Fax refills. No weekend refills. No Holliday refills. No after hours refills.  Remember:  Business hours are:  Monday to Thursday 8:00 AM to 4:00 PM Provider's Schedule: Esbeydi Manago, MD - Appointments are:  Medication management: Monday and Wednesday 8:00 AM to 4:00 PM Procedure day: Tuesday and Thursday 7:30 AM to 4:00 PM Bilal Lateef, MD - Appointments are:  Medication management: Tuesday and Thursday 8:00 AM to 4:00 PM Procedure day: Monday and Wednesday 7:30 AM to 4:00 PM (Last update: 01/19/2020) ____________________________________________________________________________________________  ____________________________________________________________________________________________  CBD (cannabidiol) & Delta-8 (Delta-8 tetrahydrocannabinol) WARNING  Intro: Cannabidiol (CBD) and tetrahydrocannabinol (THC), are two natural compounds found in plants of the Cannabis genus. They can both be extracted from hemp or cannabis. Hemp and cannabis come from the Cannabis sativa plant. Both compounds interact with your body's endocannabinoid system, but they have very different effects. CBD does not produce the high sensation associated with cannabis. Delta-8  tetrahydrocannabinol, also known as delta-8 THC, is a psychoactive substance found in the Cannabis sativa plant, of which marijuana and hemp are two varieties. THC is responsible for the high associated with the illicit use of marijuana.  Applicable to: All individuals currently taking or considering taking CBD (cannabidiol) and,   more important, all patients taking opioid analgesic controlled substances (pain medication). (Example: oxycodone; oxymorphone; hydrocodone; hydromorphone; morphine; methadone; tramadol; tapentadol; fentanyl; buprenorphine; butorphanol; dextromethorphan; meperidine; codeine; etc.)  Legal status: CBD remains a Schedule I drug prohibited for any use. CBD is illegal with one exception. In the [place], CBD has a limited Food and Drug Administration (FDA) approval for the treatment of two specific types of epilepsy disorders. Only one CBD product has been approved by the FDA for this purpose: "Epidiolex". FDA is aware that some companies are marketing products containing cannabis and cannabis-derived compounds in ways that violate the Federal Food, Drug and Cosmetic Act (FD&C Act) and that may put the health and safety of consumers at risk. The FDA, a Federal agency, has not enforced the CBD status since 2018. UPDATE: (08/17/2021) The Drug Enforcement Agency (DEA) issued a letter stating that "delta" cannabinoids, including Delta-8-THCO and Delta-9-THCO, synthetically derived from hemp do not qualify as hemp and will be viewed as Schedule I drugs. (Schedule I drugs, substances, or chemicals are defined as drugs with no currently accepted medical use and a high potential for abuse. Some examples of Schedule I drugs are: heroin, lysergic acid diethylamide (LSD), marijuana (cannabis), 3,4-methylenedioxymethamphetamine (ecstasy), methaqualone, and peyote.) (https://www.dea.gov)  Legality: Some manufacturers ship CBD products nationally, which is illegal. Often such products are sold  online and are therefore available throughout the country. CBD is openly sold in head shops and health food stores in some states where such sales have not been explicitly legalized. Selling unapproved products with unsubstantiated therapeutic claims is not only a violation of the law, but also can put patients at risk, as these products have not been proven to be safe or effective. Federal illegality makes it difficult to conduct research on CBD.  Reference: "FDA Regulation of Cannabis and Cannabis-Derived Products, Including Cannabidiol (CBD)" - https://www.fda.gov/news-events/public-health-focus/fda-regulation-cannabis-and-cannabis-derived-products-including-cannabidiol-cbd  Warning: CBD is not FDA approved and has not undergo the same manufacturing controls as prescription drugs.  This means that the purity and safety of available CBD may be questionable. Most of the time, despite manufacturer's claims, it is contaminated with THC (delta-9-tetrahydrocannabinol - the chemical in marijuana responsible for the "HIGH").  When this is the case, the THC contaminant will trigger a positive urine drug screen (UDS) test for Marijuana (carboxy-THC). Because a positive UDS for any illicit substance is a violation of our medication agreement, your opioid analgesics (pain medicine) may be permanently discontinued. The FDA recently put out a warning about 5 things that everyone should be aware of regarding Delta-8 THC: Delta-8 THC products have not been evaluated or approved by the FDA for safe use and may be marketed in ways that put the public health at risk. The FDA has received adverse event reports involving delta-8 THC-containing products. Delta-8 THC has psychoactive and intoxicating effects. Delta-8 THC manufacturing often involve use of potentially harmful chemicals to create the concentrations of delta-8 THC claimed in the marketplace. The final delta-8 THC product may have potentially harmful by-products  (contaminants) due to the chemicals used in the process. Manufacturing of delta-8 THC products may occur in uncontrolled or unsanitary settings, which may lead to the presence of unsafe contaminants or other potentially harmful substances. Delta-8 THC products should be kept out of the reach of children and pets.  MORE ABOUT CBD  General Information: CBD was discovered in 1940 and it is a derivative of the cannabis sativa genus plants (Marijuana and Hemp). It is one of the 113 identified substances found in Marijuana.   It accounts for up to 40% of the plant's extract. As of 2018, preliminary clinical studies on CBD included research for the treatment of anxiety, movement disorders, and pain. CBD is available and consumed in multiple forms, including inhalation of smoke or vapor, as an aerosol spray, and by mouth. It may be supplied as an oil containing CBD, capsules, dried cannabis, or as a liquid solution. CBD is thought not to be as psychoactive as THC (delta-9-tetrahydrocannabinol - the chemical in marijuana responsible for the "HIGH"). Studies suggest that CBD may interact with different biological target receptors in the body, including cannabinoid and other neurotransmitter receptors. As of 2018 the mechanism of action for its biological effects has not been determined.  Side-effects  Adverse reactions: Dry mouth, diarrhea, decreased appetite, fatigue, drowsiness, malaise, weakness, sleep disturbances, and others.  Drug interactions: CBC may interact with other medications such as blood-thinners. Because CBD causes drowsiness on its own, it also increases the drowsiness caused by other medications, including antihistamines (such as Benadryl), benzodiazepines (Xanax, Ativan, Valium), antipsychotics, antidepressants and opioids, as well as alcohol and supplements such as kava, melatonin and St. John's Wort. Be cautious with the following combinations:   Brivaracetam (Briviact) Brivaracetam is changed  and broken down by the body. CBD might decrease how quickly the body breaks down brivaracetam. This might increase levels of brivaracetam in the body.  Caffeine Caffeine is changed and broken down by the body. CBD might decrease how quickly the body breaks down caffeine. This might increase levels of caffeine in the body.  Carbamazepine (Tegretol) Carbamazepine is changed and broken down by the body. CBD might decrease how quickly the body breaks down carbamazepine. This might increase levels of carbamazepine in the body and increase its side effects.  Citalopram (Celexa) Citalopram is changed and broken down by the body. CBD might decrease how quickly the body breaks down citalopram. This might increase levels of citalopram in the body and increase its side effects.  Clobazam (Onfi) Clobazam is changed and broken down by the liver. CBD might decrease how quickly the liver breaks down clobazam. This might increase the effects and side effects of clobazam.  Eslicarbazepine (Aptiom) Eslicarbazepine is changed and broken down by the body. CBD might decrease how quickly the body breaks down eslicarbazepine. This might increase levels of eslicarbazepine in the body by a small amount.  Everolimus (Zostress) Everolimus is changed and broken down by the body. CBD might decrease how quickly the body breaks down everolimus. This might increase levels of everolimus in the body.  Lithium Taking higher doses of CBD might increase levels of lithium. This can increase the risk of lithium toxicity.  Medications changed by the liver (Cytochrome P450 1A1 (CYP1A1) substrates) Some medications are changed and broken down by the liver. CBD might change how quickly the liver breaks down these medications. This could change the effects and side effects of these medications.  Medications changed by the liver (Cytochrome P450 1A2 (CYP1A2) substrates) Some medications are changed and broken down by the liver. CBD  might change how quickly the liver breaks down these medications. This could change the effects and side effects of these medications.  Medications changed by the liver (Cytochrome P450 1B1 (CYP1B1) substrates) Some medications are changed and broken down by the liver. CBD might change how quickly the liver breaks down these medications. This could change the effects and side effects of these medications.  Medications changed by the liver (Cytochrome P450 2A6 (CYP2A6) substrates) Some medications are changed and   broken down by the liver. CBD might change how quickly the liver breaks down these medications. This could change the effects and side effects of these medications.  Medications changed by the liver (Cytochrome P450 2B6 (CYP2B6) substrates) Some medications are changed and broken down by the liver. CBD might change how quickly the liver breaks down these medications. This could change the effects and side effects of these medications.  Medications changed by the liver (Cytochrome P450 2C19 (CYP2C19) substrates) Some medications are changed and broken down by the liver. CBD might change how quickly the liver breaks down these medications. This could change the effects and side effects of these medications.  Medications changed by the liver (Cytochrome P450 2C8 (CYP2C8) substrates) Some medications are changed and broken down by the liver. CBD might change how quickly the liver breaks down these medications. This could change the effects and side effects of these medications.  Medications changed by the liver (Cytochrome P450 2C9 (CYP2C9) substrates) Some medications are changed and broken down by the liver. CBD might change how quickly the liver breaks down these medications. This could change the effects and side effects of these medications.  Medications changed by the liver (Cytochrome P450 2D6 (CYP2D6) substrates) Some medications are changed and broken down by the liver. CBD might  change how quickly the liver breaks down these medications. This could change the effects and side effects of these medications.  Medications changed by the liver (Cytochrome P450 2E1 (CYP2E1) substrates) Some medications are changed and broken down by the liver. CBD might change how quickly the liver breaks down these medications. This could change the effects and side effects of these medications.  Medications changed by the liver (Cytochrome P450 3A4 (CYP3A4) substrates) Some medications are changed and broken down by the liver. CBD might change how quickly the liver breaks down these medications. This could change the effects and side effects of these medications.  Medications changed by the liver (Glucuronidated drugs) Some medications are changed and broken down by the liver. CBD might change how quickly the liver breaks down these medications. This could change the effects and side effects of these medications.  Medications that decrease the breakdown of other medications by the liver (Cytochrome P450 2C19 (CYP2C19) inhibitors) CBD is changed and broken down by the liver. Some drugs decrease how quickly the liver changes and breaks down CBD. This could change the effects and side effects of CBD.  Medications that decrease the breakdown of other medications in the liver (Cytochrome P450 3A4 (CYP3A4) inhibitors) CBD is changed and broken down by the liver. Some drugs decrease how quickly the liver changes and breaks down CBD. This could change the effects and side effects of CBD.  Medications that increase breakdown of other medications by the liver (Cytochrome P450 3A4 (CYP3A4) inducers) CBD is changed and broken down by the liver. Some drugs increase how quickly the liver changes and breaks down CBD. This could change the effects and side effects of CBD.  Medications that increase the breakdown of other medications by the liver (Cytochrome P450 2C19 (CYP2C19) inducers) CBD is changed and  broken down by the liver. Some drugs increase how quickly the liver changes and breaks down CBD. This could change the effects and side effects of CBD.  Methadone (Dolophine) Methadone is broken down by the liver. CBD might decrease how quickly the liver breaks down methadone. Taking cannabidiol along with methadone might increase the effects and side effects of methadone.  Rufinamide (Banzel) Rufinamide is   changed and broken down by the body. CBD might decrease how quickly the body breaks down rufinamide. This might increase levels of rufinamide in the body by a small amount.  Sedative medications (CNS depressants) CBD might cause sleepiness and slowed breathing. Some medications, called sedatives, can also cause sleepiness and slowed breathing. Taking CBD with sedative medications might cause breathing problems and/or too much sleepiness.  Sirolimus (Rapamune) Sirolimus is changed and broken down by the body. CBD might decrease how quickly the body breaks down sirolimus. This might increase levels of sirolimus in the body.  Stiripentol (Diacomit) Stiripentol is changed and broken down by the body. CBD might decrease how quickly the body breaks down stiripentol. This might increase levels of stiripentol in the body and increase its side effects.  Tacrolimus (Prograf) Tacrolimus is changed and broken down by the body. CBD might decrease how quickly the body breaks down tacrolimus. This might increase levels of tacrolimus in the body.  Tamoxifen (Soltamox) Tamoxifen is changed and broken down by the body. CBD might affect how quickly the body breaks down tamoxifen. This might affect levels of tamoxifen in the body.  Topiramate (Topamax) Topiramate is changed and broken down by the body. CBD might decrease how quickly the body breaks down topiramate. This might increase levels of topiramate in the body by a small amount.  Valproate Valproic acid can cause liver injury. Taking cannabidiol  with valproic acid might increase the chance of liver injury. CBD and/or valproic acid might need to be stopped, or the dose might need to be reduced.  Warfarin (Coumadin) CBD might increase levels of warfarin, which can increase the risk for bleeding. CBD and/or warfarin might need to be stopped, or the dose might need to be reduced.  Zonisamide Zonisamide is changed and broken down by the body. CBD might decrease how quickly the body breaks down zonisamide. This might increase levels of zonisamide in the body by a small amount. (Last update: 08/29/2021) ____________________________________________________________________________________________  ____________________________________________________________________________________________  Drug Holidays (Slow)  What is a "Drug Holiday"? Drug Holiday: is the name given to the period of time during which a patient stops taking a medication(s) for the purpose of eliminating tolerance to the drug.  Benefits Improved effectiveness of opioids. Decreased opioid dose needed to achieve benefits. Improved pain with lesser dose.  What is tolerance? Tolerance: is the progressive decreased in effectiveness of a drug due to its repetitive use. With repetitive use, the body gets use to the medication and as a consequence, it loses its effectiveness. This is a common problem seen with opioid pain medications. As a result, a larger dose of the drug is needed to achieve the same effect that used to be obtained with a smaller dose.  How long should a "Drug Holiday" last? You should stay off of the pain medicine for at least 14 consecutive days. (2 weeks)  Should I stop the medicine "cold turkey"? No. You should always coordinate with your Pain Specialist so that he/she can provide you with the correct medication dose to make the transition as smoothly as possible.  How do I stop the medicine? Slowly. You will be instructed to decrease the daily amount of  pills that you take by one (1) pill every seven (7) days. This is called a "slow downward taper" of your dose. For example: if you normally take four (4) pills per day, you will be asked to drop this dose to three (3) pills per day for seven (7) days, then to   two (2) pills per day for seven (7) days, then to one (1) per day for seven (7) days, and at the end of those last seven (7) days, this is when the "Drug Holiday" would start.   Will I have withdrawals? By doing a "slow downward taper" like this one, it is unlikely that you will experience any significant withdrawal symptoms. Typically, what triggers withdrawals is the sudden stop of a high dose opioid therapy. Withdrawals can usually be avoided by slowly decreasing the dose over a prolonged period of time. If you do not follow these instructions and decide to stop your medication abruptly, withdrawals may be possible.  What are withdrawals? Withdrawals: refers to the wide range of symptoms that occur after stopping or dramatically reducing opiate drugs after heavy and prolonged use. Withdrawal symptoms do not occur to patients that use low dose opioids, or those who take the medication sporadically. Contrary to benzodiazepine (example: Valium, Xanax, etc.) or alcohol withdrawals ("Delirium Tremens"), opioid withdrawals are not lethal. Withdrawals are the physical manifestation of the body getting rid of the excess receptors.  Expected Symptoms Early symptoms of withdrawal may include: Agitation Anxiety Muscle aches Increased tearing Insomnia Runny nose Sweating Yawning  Late symptoms of withdrawal may include: Abdominal cramping Diarrhea Dilated pupils Goose bumps Nausea Vomiting  Will I experience withdrawals? Due to the slow nature of the taper, it is very unlikely that you will experience any.  What is a slow taper? Taper: refers to the gradual decrease in dose.  (Last update:  01/19/2020) ____________________________________________________________________________________________   Naloxone Nasal Spray What is this medication? NALOXONE (nal OX one) treats opioid overdose, which causes slow or shallow breathing, severe drowsiness, or trouble staying awake. Call emergency services after using this medication. You may need additional treatment. Naloxone works by reversing the effects of opioids. It belongs to a group of medications called opioid blockers. This medicine may be used for other purposes; ask your health care provider or pharmacist if you have questions. COMMON BRAND NAME(S): Kloxxado, Narcan What should I tell my care team before I take this medication? They need to know if you have any of these conditions: Heart disease Substance use disorder An unusual or allergic reaction to naloxone, other medications, foods, dyes, or preservatives Pregnant or trying to get pregnant Breast-feeding How should I use this medication? This medication is for use in the nose. Lay the person on their back. Support their neck with your hand and allow the head to tilt back before giving the medication. The nasal spray should be given into 1 nostril. After giving the medication, move the person onto their side. Do not remove or test the nasal spray until ready to use. Get emergency medical help right away after giving the first dose of this medication, even if the person wakes up. You should be familiar with how to recognize the signs and symptoms of a narcotic overdose. If more doses are needed, give the additional dose in the other nostril. Talk to your care team about the use of this medication in children. While this medication may be prescribed for children as young as newborns for selected conditions, precautions do apply. Overdosage: If you think you have taken too much of this medicine contact a poison control center or emergency room at once. NOTE: This medicine is only  for you. Do not share this medicine with others. What if I miss a dose? This does not apply. What may interact with this medication? This is only used  during an emergency. No interactions are expected during emergency use. This list may not describe all possible interactions. Give your health care provider a list of all the medicines, herbs, non-prescription drugs, or dietary supplements you use. Also tell them if you smoke, drink alcohol, or use illegal drugs. Some items may interact with your medicine. What should I watch for while using this medication? Keep this medication ready for use in the case of an opioid overdose. Make sure that you have the phone number of your care team and local hospital ready. You may need to have additional doses of this medication. Each nasal spray contains a single dose. Some emergencies may require additional doses. After use, bring the treated person to the nearest hospital or call 911. Make sure the treating care team knows that the person has received a dose of this medication. You will receive additional instructions on what to do during and after use of this medication before an emergency occurs. What side effects may I notice from receiving this medication? Side effects that you should report to your care team as soon as possible: Allergic reactions--skin rash, itching, hives, swelling of the face, lips, tongue, or throat Side effects that usually do not require medical attention (report these to your care team if they continue or are bothersome): Constipation Dryness or irritation inside the nose Headache Increase in blood pressure Muscle spasms Stuffy nose Toothache This list may not describe all possible side effects. Call your doctor for medical advice about side effects. You may report side effects to FDA at 1-800-FDA-1088. Where should I keep my medication? Keep out of the reach of children and pets. Store between 20 and 25 degrees C (68 and 77  degrees F). Do not freeze. Throw away any unused medication after the expiration date. Keep in original box until ready to use. NOTE: This sheet is a summary. It may not cover all possible information. If you have questions about this medicine, talk to your doctor, pharmacist, or health care provider.  2023 [employer] Standard (2021-05-14 00:00:00)

## 2022-04-03 NOTE — Progress Notes (Signed)
PROVIDER NOTE: Information contained herein reflects review and annotations entered in association with encounter. Interpretation of such information and data should be left to medically-trained personnel. Information provided to patient can be located elsewhere in the medical record under "Patient Instructions". Document created using STT-dictation technology, any transcriptional errors that may result from process are unintentional.    Patient: Jacqueline Thornton  Service Category: E/M  Provider: Gaspar Cola, MD  DOB: 09-04-1950  DOS: 04/03/2022  Referring Provider: Perrin Maltese, MD  MRN: [id number]  Specialty: Interventional Pain Management  PCP: Perrin Maltese, MD  Type: Established Patient  Setting: Ambulatory outpatient    Location: Office  Delivery: Face-to-face     HPI  [name], a 71 y.o. year old female, is here today because of her Sacral back pain [M53.3]. Ms. Paar's primary complain today is Back Pain (lower) Last encounter: My last encounter with her was on 02/26/2022. Pertinent problems: Ms. Metzger has Chronic pain syndrome; Headache, migraine; Chronic low back pain (Bilateral) (R>L) w/o sciatica; Spondylosis of lumbar spine; Lumbar annular disc tear (L4-5); Discogenic low back pain (L3-4 and L4-5); Lumbar facet hypertrophy; Lumbar facet syndrome (Bilateral) (R>L); Chronic neck pain (Left); Cervical spondylosis; Hx of cervical spine surgery; Cervical spinal fusion (C6-7 interbody fusion); Chronic sacroiliac joint pain (Bilateral) (L>R); Chronic lumbar radicular pain (Left); Trochanteric bursitis of hip (Right); Chronic hip pain (Bilateral) (L>R); Neurogenic pain; Myofascial pain; Osteoarthritis of hip (Left); Arthrodesis status; DDD (degenerative disc disease), lumbar; Myalgia; Trochanteric bursitis of hip (Bilateral) (L>R); Spondylosis without myelopathy or radiculopathy, lumbosacral region; Spondylosis without myelopathy or radiculopathy, sacral and sacrococcygeal  region; Other specified dorsopathies, sacral and sacrococcygeal region; Age-related osteoporosis without current pathological fracture; Bone island of right femur; Inflammatory spondylopathy of lumbar region [facility]); Trigger point with back pain (Right); Fibromyalgia; DDD (degenerative disc disease), cervical; Spondylolisthesis of lumbar region; History of lumbar spinal fusion (Dr. Erline Levine) (12/11/2018); Failed back surgical syndrome; Unilateral occipital headache (Left); Cervicogenic headache (Left); Chronic hip pain (Left); Spondylolisthesis at L5-S1 level; Spondylosis of lumbosacral region without myelopathy or radiculopathy; Epidural fibrosis; Enthesopathy of hip region (Left); Chronic sacroiliac joint pain (Left); Enthesopathy of sacroiliac joint (Left); Somatic dysfunction of sacroiliac joint (Left); Herniated cervical disc without myelopathy; Chronic sacroiliac joint pain (Right); Osteoarthritis of sacroiliac joint (Right) ([place]); Somatic dysfunction of sacroiliac joint (Right); Chronic hip pain (Right); Neck pain; Herniated nucleus pulposus, C3-4; Prolapsed cervical intervertebral disc; Cervical spinal stenosis; Radiculopathy, cervical region; Lumbar central spinal stenosis, 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 Pain Assessment: Severity of Chronic pain is reported as a 6 /10. Location: Back Right, Left/pain radiaties down both leg to her feet,. Onset: 1 to 4 weeks ago. Quality: Aching, Burning, Constant, Throbbing, Tingling, Numbness. Timing: Constant. Modifying factor(s): meds, heat and ice pack. Vitals:  height is 5' (1.524 m) and weight is 143 lb (64.9 kg). Her temperature is 96.9 F (36.1 C) (abnormal). Her blood pressure is  121/66 and her pulse is 84. Her oxygen saturation is 96%.   Reason for encounter: medication management.  The patient indicates doing well with the current medication regimen. No adverse reactions or side effects reported to the medications.  The patient indicates that she is pending to have an evaluation with the neurosurgeon to determine if she would be a  good candidate for further back surgery.  Currently her worst pain is in the lower back (Midline) going down to her tailbone, which she describes is secondary to severe spinal stenosis.  According to the patient, they are currently evaluating her to determine the degree of osteoporosis that she currently have, just in case a fusion is needed.  The description of the location of her current pain seems to be 1 where she may be experiencing pain coming from the posterior longitudinal ligament and interspinous process ligaments as well as pain from the area of the anterior longitudinal ligament, all of which could refer pain to the midline.  The patient does have a prior L5-S1 interbody fusion with adjacent level degenerative changes and a grade 1 anterolisthesis of L4 over L5 with moderate central canal stenosis, bilateral subarticular recess stenosis, and mild bilateral foraminal stenosis, possibly due to the anatomical changes caused by the anterolisthesis.  This is all documented on her most recent lumbar MRI on 01/28/2022.  Interestingly, the patient denies any radicular symptoms.  She is not mixed.  Is seeing any lower extremity pain, numbness, or weakness suggesting that all of this pain he is possibly coming from the above-mentioned structures, none of which would really require any further decompression, but it would probably benefit from a fusion.  However, if she has significant osteoporosis, it is unlikely that she would be a good candidate secondary to the fact that it would be difficult for the hardware to take.  In evaluating her case, I have  offered her a caudal epidural steroid injection to assist with her tailbone pain and pain in the sacral region.  I would probably combine this with a trigger point injection around the interspinous ligaments in the affected area.  Today the patient asked me about the possibility of her getting her spinal cord stimulator back which did not work so well for her in the past, but I have reminded her that this would all depend on the extent of her spinal stenosis and whether or not she has any from the L3-4 level up.  In order to further investigate this possibility we would have to do a CT of the thoracolumbar spine prior to attempting a spinal cord stimulator trial.  Today I offered to go ahead and put her on the schedule for the above interventional treatment, but she indicated that she wants to see the neurosurgeon first.  RTCB: 07/10/2022 Nonopioids transferred 05/24/2020: Vitamin B12, Flexeril, and Neurontin.  Pharmacotherapy Assessment  Analgesic: Oxycodone/APAP 7.5/325, 1 tab PO q 5 times a day. (56.25 mg/day of oxycodone) MME/day: 56.25 mg/day.   Monitoring: [place] PMP: PDMP reviewed during this encounter.       Pharmacotherapy: No side-effects or adverse reactions reported. Compliance: No problems identified. Effectiveness: Clinically acceptable.  Chauncey Fischer, RN  04/03/2022 11:50 AM  Sign when Signing Visit Nursing Pain Medication Assessment:  Safety precautions to be maintained throughout the outpatient stay will include: orient to surroundings, keep bed in low position, maintain call bell within reach at all times, provide assistance with transfer out of bed and ambulation.  Medication Inspection Compliance: Pill count conducted under aseptic conditions, in front of the patient. Neither the pills nor the bottle was removed from the patient's sight at any time. Once count was completed pills were immediately returned to the patient in their original bottle.  Medication:  Oxycodone/APAP Pill/Patch Count:  41 of 150 pills remain Pill/Patch Appearance: Markings consistent with prescribed medication Bottle Appearance: Standard pharmacy  container. Clearly labeled. Filled Date: 58 / 13 / 2023 Last Medication intake:  TodaySafety precautions to be maintained throughout the outpatient stay will include: orient to surroundings, keep bed in low position, maintain call bell within reach at all times, provide assistance with transfer out of bed and ambulation.     No results found for: "CBDTHCR" No results found for: "D8THCCBX" No results found for: "D9THCCBX"  UDS:  Summary  Date Value Ref Range Status  12/19/2021 Note  Final    Comment:    ==================================================================== [employer] [facility] (MW) ==================================================================== Test                             Result       Flag       Units  Drug Present and Declared for Prescription Verification   Oxycodone                      >8772        EXPECTED   ng/mg creat   Oxymorphone                    1343         EXPECTED   ng/mg creat   Noroxycodone                   >8772        EXPECTED   ng/mg creat   Noroxymorphone                 446          EXPECTED   ng/mg creat    Sources of oxycodone are scheduled prescription medications.    Oxymorphone, noroxycodone, and noroxymorphone are expected    metabolites of oxycodone. Oxymorphone is also available as a    scheduled prescription medication.  ==================================================================== Test                      Result    Flag   Units      Ref Range   Creatinine              114              mg/dL      >=20 ==================================================================== Declared Medications:  The flagging and interpretation on this report are based on the  following declared medications.  Unexpected results may arise from  inaccuracies in the declared  medications.   **Note: The testing scope of this panel includes these medications:   Oxycodone (Percocet)   **Note: The testing scope of this panel does not include the  following reported medications:   Acetaminophen (Percocet)  Atorvastatin  Calcium  Cyclobenzaprine (Flexeril)  Duloxetine (Cymbalta)  Enalapril (Vasotec)  Estradiol (Estrace)  Gabapentin (Neurontin)  Ibandronate (Boniva)  Melatonin  Multivitamin  Omeprazole  Spironolactone  Vitamin B12 ==================================================================== For clinical consultation, please call [phone]. ====================================================================       ROS  Constitutional: Denies any fever or chills Gastrointestinal: No reported hemesis, hematochezia, vomiting, or acute GI distress Musculoskeletal: Denies any acute onset joint swelling, redness, loss of ROM, or weakness Neurological: No reported episodes of acute onset apraxia, aphasia, dysarthria, agnosia, amnesia, paralysis, loss of coordination, or loss of consciousness  Medication Review  Calcium, DULoxetine, Melatonin, Vitamin B-12, atorvastatin, enalapril, estradiol, furosemide, gabapentin, ibandronate, meloxicam, methocarbamol, multivitamin with minerals, naloxone, omeprazole, oxyCODONE-acetaminophen, rosuvastatin, and spironolactone  History Review  Allergy: Ms. Park has No Known Allergies. Drug: Ms. Bouchie  reports no history of drug use. Alcohol:  reports no history of alcohol use. Tobacco:  reports that she quit smoking about 18 years ago. Her smoking use included cigarettes. She has never used smokeless tobacco. Social: Ms. Jupiter  reports that she quit smoking about 18 years ago. Her smoking use included cigarettes. She has never used smokeless tobacco. She reports that she does not drink alcohol and does not use drugs. Medical:  has a past medical history of Absolute anemia (04/11/2015), Acute postoperative  pain (08/07/2017), Angina pectoris ([place]), Anxiety, Arthritis, Arthropathy of sacroiliac joint (04/11/2015), Atypical face pain (04/24/2015), Back pain, CAD (coronary artery disease), Cancer ([place]), Chronic back pain, Constipation, Depression, Fibromyalgia, GERD (gastroesophageal reflux disease), 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) (04/11/2015), H/O: hysterectomy (1979), Heart murmur, Hyperlipidemia, Hypertension, Low back pain (04/06/2015), Lumbar radicular pain (04/18/2015), Migraine, Narrowing of intervertebral disc space (04/11/2015), Sacroiliac joint pain (04/11/2015), and Spine disorder. Surgical: Ms. Arreaga  has a past surgical history that includes Cholecystectomy; Abdominal hysterectomy; Neck surgery; Colonoscopy with propofol (N/A, 07/19/2015); caract surger; Laparoscopic salpingo oophorectomy (Bilateral, 03/03/2018); Cystoscopy (03/03/2018); Tonsillectomy; Appendectomy; Tumor removal; Anterior lumbar fusion (N/A, 12/11/2018); Abdominal exposure (N/A, 12/11/2018); Anterior cervical decomp/discectomy fusion (N/A, 10/06/2020); Toe Fusion (Right); Esophagogastroduodenoscopy (egd) with propofol (N/A, 09/11/2021); Colonoscopy with propofol (N/A, 09/11/2021); and Colonoscopy with propofol (N/A, 03/05/2022). Family: family history includes Colon cancer in her maternal grandmother and mother; Diabetes in her father and mother; Hypertension in her sister; Stroke in her mother.  Laboratory Chemistry Profile   Renal Lab Results  Component Value Date   BUN 8 10/06/2020   CREATININE 0.90 06/20/2021   BCR 8 (L) 12/16/2019   GFRAA 47 (L) 12/16/2019   GFRNONAA >60 10/06/2020    Hepatic Lab Results  Component Value Date   AST 15 12/16/2019   ALT 14 03/08/2018   ALBUMIN 4.0 12/16/2019   ALKPHOS 60 12/16/2019    Electrolytes Lab Results  Component Value Date   NA 137 10/06/2020   K 3.6 10/06/2020   CL 105 10/06/2020   CALCIUM 8.6 (L) 10/06/2020   MG 2.4 (H) 12/16/2019    Bone Lab Results   Component Value Date   25OHVITD1 53 12/16/2019   25OHVITD2 <1.0 12/16/2019   25OHVITD3 53 12/16/2019    Inflammation (CRP: Acute Phase) (ESR: Chronic Phase) Lab Results  Component Value Date   CRP 4 12/16/2019   ESRSEDRATE 6 12/16/2019         Note: Above Lab results reviewed.  Recent Imaging Review  DG PAIN CLINIC C-ARM 1-60 MIN NO REPORT Fluoro was used, but no Radiologist interpretation will be provided.  Please refer to "NOTES" tab for provider progress note. Note: Reviewed        Physical Exam  General appearance: Well nourished, well developed, and well hydrated. In no apparent acute distress Mental status: Alert, oriented x 3 (person, place, & time)       Respiratory: No evidence of acute respiratory distress Eyes: PERLA Vitals: BP 121/66   Pulse 84   Temp (!) 96.9 F (36.1 C)   Ht 5' (1.524 m)   Wt 143 lb (64.9 kg)   SpO2 96%   BMI 27.93 kg/m  BMI: Estimated body mass index is 27.93 kg/m as calculated from the following:   Height as of this encounter: 5' (1.524 m).   Weight as of this encounter: 143 lb (64.9 kg). Ideal: Ideal body weight: 45.5 kg (100 lb 4.9 oz)  Adjusted ideal body weight: 53.2 kg (117 lb 6.2 oz)  Assessment   Diagnosis Status  1. Sacral back pain (Midline)   2. Coccygodynia   3. Chronic pain syndrome   4. 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 Failed back surgical syndrome   6. DDD (degenerative disc disease), lumbar   7. Lumbar facet syndrome (Bilateral) (R>L)   8. Chronic hip pain (Bilateral) (L>R)   9. Chronic neck pain (Left)   10. Abnormal MRI, lumbar spine & Sacrum (01/28/2022)   11. Pharmacologic therapy   12. Chronic use of opiate for therapeutic purpose   13. Encounter for medication management   14. Encounter for chronic pain management   15. At risk for respiratory depression due to opioid    Controlled Controlled Controlled   Updated Problems: Problem  Coccygodynia  Sacral back pain (Midline)   At Risk for Respiratory Depression Due to Opioid     Plan of Care  Problem-specific:  No problem-specific Assessment & Plan notes found for this encounter.  Ms. SHAHRZAD KOBLE has a current medication list which includes the following long-term medication(s): atorvastatin, calcium, duloxetine, enalapril, furosemide, omeprazole, [START ON 04/11/2022] oxycodone-acetaminophen, [START ON 05/11/2022] oxycodone-acetaminophen, [START ON 06/10/2022] oxycodone-acetaminophen, spironolactone, vitamin b-12, and gabapentin.  Pharmacotherapy (Medications Ordered): Meds ordered this encounter  Medications   oxyCODONE-acetaminophen (PERCOCET) 7.5-325 MG tablet    Sig: Take 1 tablet by mouth 5 (five) times daily. Must last 30 days    Dispense:  150 tablet    Refill:  0    DO NOT: delete (not duplicate); no partial-fill (will deny script to complete), no refill request (F/U required). DISPENSE: 1 day early if closed on fill date. WARN: No CNS-depressants within 8 hrs of med.   oxyCODONE-acetaminophen (PERCOCET) 7.5-325 MG tablet    Sig: Take 1 tablet by mouth 5 (five) times daily. Must last 30 days    Dispense:  150 tablet    Refill:  0    DO NOT: delete (not duplicate); no partial-fill (will deny script to complete), no refill request (F/U required). DISPENSE: 1 day early if closed on fill date. WARN: No CNS-depressants within 8 hrs of med.   oxyCODONE-acetaminophen (PERCOCET) 7.5-325 MG tablet    Sig: Take 1 tablet by mouth 5 (five) times daily. Must last 30 days    Dispense:  150 tablet    Refill:  0    DO NOT: delete (not duplicate); no partial-fill (will deny script to complete), no refill request (F/U required). DISPENSE: 1 day early if closed on fill date. WARN: No CNS-depressants within 8 hrs of med.   naloxone (NARCAN) nasal spray 4 mg/0.1 mL    Sig: Place 1 spray into the nose as needed for up to 365 doses (for opioid-induced respiratory depresssion). In case of emergency (overdose), spray  once into each nostril. If no response within 3 minutes, repeat application and call [phone].    Dispense:  1 each    Refill:  0    Instruct patient in proper use of device.   Orders:  Orders Placed This Encounter  Procedures   Caudal Epidural Injection    Standing Status:   Standing    Number of Occurrences:   1    Standing Expiration Date:   07/04/2022    Scheduling Instructions:     Laterality: Midline     Level(s): Sacrococcygeal canal (Tailbone area)     Sedation: Patient's choice     Scheduling Timeframe:  PRN    Order Specific Question:   Where will this procedure be performed?    Answer:   [facility]   Follow-up plan:   Return in about 14 weeks (around 07/10/2022) for Eval-day (M,W), (F2F), (MM).     Interventional Therapies  Risk  Complexity Considerations:   Estimated body mass index is 28.32 kg/m as calculated from the following:   Height as of this encounter: 5' (1.524 m).   Weight as of this encounter: 145 lb (65.8 kg). WNL   Planned  Pending:   Diagnostic/therapeutic midline caudal ESI #2 + sacral interspinous ligament MNB/TPI #1    Under consideration:   Diagnostic caudal diagnostic epidurogram Diagnostic CESI  Diagnostic bilateral cervical facet block  Possible bilateral cervical facet RFA  Diagnostic left L4-5 LESI  Diagnostic left S1 SNRB    Completed:   Diagnostic bilateral L4 TFESI x1 (01/08/2022) (100/25/0)  Therapeutic right Racz procedure x1 (08/17/2020) (04/09/84/85) (100% relief of the right LEP) Diagnostic caudal ESI x1 (07/11/2020) (100/100/0/0)  Palliative left L5-S1 LESI x2 (07/27/2015) (70/70/0/0)  Diagnostic left lumbar facet MBB x7 (02/07/2022) (20/20/0/0)  Palliative right lumbar facet MBB x10 (02/07/2022) (20/20/0/0)  Therapeutic right shoulder joint inj. x1 (02/29/2016) (90/80/30)  Therapeutic left IA hip injection x3 (06/13/2020) (100/100/30/>50)  Diagnostic right SI joint Blk x3 (06/17/2017) (100/90/80/80)  Diagnostic/therapeutic  left SI joint Blk x4 (06/17/2017) (100/90/80/80)  Palliative bilateral trochanteric bursa inj. x1 (12/11/2016) (100/100/90/90)  Therapeutic right lumbar facet RFA x2 (01/11/2021) (100/100/70)  Therapeutic left lumbar facet RFA x2 (12/19/2020) (100/100/75/>75) Therapeutic right SI joint RFA x1 (08/07/2017) (100/100/80)  Therapeutic left SI joint RFA x1 (09/16/2017) (100/100/100)    Therapeutic  Palliative (PRN) options:   Palliative Racz procedure  Palliative bilateral SI joint block  Palliative right trochanteric bursa injection     Recent Visits Date Type Provider Dept  02/26/22 Office Visit Milinda Pointer, MD [facility]  02/07/22 Procedure visit Milinda Pointer, MD [facility]  01/22/22 Office Visit Milinda Pointer, MD [facility]  01/08/22 Procedure visit Milinda Pointer, MD [facility]  Showing recent visits within past 90 days and meeting all other requirements Today's Visits Date Type Provider Dept  04/03/22 Office Visit Milinda Pointer, MD [facility]  Showing today's visits and meeting all other requirements Future Appointments No visits were found meeting these conditions. Showing future appointments within next 90 days and meeting all other requirements  I discussed the assessment and treatment plan with the patient. The patient was provided an opportunity to ask questions and all were answered. The patient agreed with the plan and demonstrated an understanding of the instructions.  Patient advised to call back or seek an in-person evaluation if the symptoms or condition worsens.  Duration of encounter: 30 minutes.  Total time on encounter, as per AMA guidelines included both the face-to-face and non-face-to-face time personally spent by the physician and/or other qualified health care professional(s) on the day of the encounter (includes time in activities that require the physician or other qualified health care  professional and does not include time in activities normally performed by clinical staff). Physician's time may include the following activities when performed: preparing to see the patient (eg, review of tests, pre-charting review of records) obtaining and/or reviewing separately obtained history performing a medically appropriate examination and/or evaluation counseling and educating the patient/family/caregiver ordering medications, tests, or procedures referring and communicating with other health care professionals (when not separately reported) documenting clinical information in the electronic or  other health record independently interpreting results (not separately reported) and communicating results to the patient/ family/caregiver care coordination (not separately reported)  Note by: Gaspar Cola, MD Date: 04/03/2022; Time: 12:21 PM

## 2022-04-03 NOTE — Progress Notes (Signed)
Nursing Pain Medication Assessment:  Safety precautions to be maintained throughout the outpatient stay will include: orient to surroundings, keep bed in low position, maintain call bell within reach at all times, provide assistance with transfer out of bed and ambulation.  Medication Inspection Compliance: Pill count conducted under aseptic conditions, in front of the patient. Neither the pills nor the bottle was removed from the patient's sight at any time. Once count was completed pills were immediately returned to the patient in their original bottle.  Medication: Oxycodone/APAP Pill/Patch Count:  41 of 150 pills remain Pill/Patch Appearance: Markings consistent with prescribed medication Bottle Appearance: Standard pharmacy container. Clearly labeled. Filled Date: 18 / 13 / 2023 Last Medication intake:  TodaySafety precautions to be maintained throughout the outpatient stay will include: orient to surroundings, keep bed in low position, maintain call bell within reach at all times, provide assistance with transfer out of bed and ambulation.

## 2022-04-15 ENCOUNTER — Other Ambulatory Visit: Payer: Self-pay | Admitting: Neurological Surgery

## 2022-04-16 ENCOUNTER — Other Ambulatory Visit: Payer: Self-pay | Admitting: Neurological Surgery

## 2022-04-23 NOTE — Progress Notes (Signed)
Surgical Instructions    Your procedure is scheduled on Tuesday, 04/30/22.  Report to [facility] Main Entrance "A" at 7:30 A.M., then check in with the Admitting office.  Call this number if you have problems the morning of surgery:  [phone]   If you have any questions prior to your surgery date call [phone]: Open Monday-Friday 8am-4pm If you experience any cold or flu symptoms such as cough, fever, chills, shortness of breath, etc. between now and your scheduled surgery, please notify us at the above number     Remember:  Do not eat or drink after midnight the night before your surgery     Take these medicines the morning of surgery with A SIP OF WATER:  gabapentin (NEURONTIN) methocarbamol (ROBAXIN) omeprazole (PRILOSEC)  As of today, STOP taking any Aspirin (unless otherwise instructed by your surgeon) Aleve, Naproxen, Ibuprofen, Motrin, Advil, Goody's, BC's, all herbal medications, fish oil, and all vitamins.           Do not wear jewelry or makeup. Do not wear lotions, powders, perfumes or deodorant. Do not shave 48 hours prior to surgery.  Do not bring valuables to the hospital. Do not wear nail polish, gel polish, artificial nails, or any other type of covering on natural nails (fingers and toes) If you have artificial nails or gel coating that need to be removed by a nail salon, please have this removed prior to surgery. Artificial nails or gel coating may interfere with anesthesia's ability to adequately monitor your vital signs.  [facility] is not responsible for any belongings or valuables.    Do NOT Smoke (Tobacco/Vaping)  24 hours prior to your procedure  If you use a CPAP at night, you may bring your mask for your overnight stay.   Contacts, glasses, hearing aids, dentures or partials may not be worn into surgery, please bring cases for these belongings   For patients admitted to the hospital, discharge time will be determined by your treatment team.    Patients discharged the day of surgery will not be allowed to drive home, and someone needs to stay with them for 24 hours.   SURGICAL WAITING ROOM VISITATION Patients having surgery or a procedure may have no more than 2 support people in the waiting area - these visitors may rotate.   Children under the age of 17 must have an adult with them who is not the patient. If the patient needs to stay at the hospital during part of their recovery, the visitor guidelines for inpatient rooms apply. Pre-op nurse will coordinate an appropriate time for 1 support person to accompany patient in pre-op.  This support person may not rotate.   Please refer to [email or web address] for the visitor guidelines for Inpatients (after your surgery is over and you are in a regular room).    Special instructions:    Oral Hygiene is also important to reduce your risk of infection.  Remember - BRUSH YOUR TEETH THE MORNING OF SURGERY WITH YOUR REGULAR TOOTHPASTE   [place]- Preparing For Surgery  Before surgery, you can play an important role. Because skin is not sterile, your skin needs to be as free of germs as possible. You can reduce the number of germs on your skin by washing with CHG (chlorahexidine gluconate) Soap before surgery.  CHG is an antiseptic cleaner which kills germs and bonds with the skin to continue killing germs even after washing.     Please do not use if you have  an allergy to CHG or antibacterial soaps. If your skin becomes reddened/irritated stop using the CHG.  Do not shave (including legs and underarms) for at least 48 hours prior to first CHG shower. It is OK to shave your face.  Please follow these instructions carefully.     Shower the NIGHT BEFORE SURGERY and the MORNING OF SURGERY with CHG Soap.   If you chose to wash your hair, wash your hair first as usual with your normal shampoo. After you shampoo, rinse your hair and body  thoroughly to remove the shampoo.  Then [employer] and genitals (private parts) with your normal soap and rinse thoroughly to remove soap.  After that Use CHG Soap as you would any other liquid soap. You can apply CHG directly to the skin and wash gently with a scrungie or a clean washcloth.   Apply the CHG Soap to your body ONLY FROM THE NECK DOWN.  Do not use on open wounds or open sores. Avoid contact with your eyes, ears, mouth and genitals (private parts). Wash Face and genitals (private parts)  with your normal soap.   Wash thoroughly, paying special attention to the area where your surgery will be performed.  Thoroughly rinse your body with warm water from the neck down.  DO NOT shower/wash with your normal soap after using and rinsing off the CHG Soap.  Pat yourself dry with a CLEAN TOWEL.  Wear CLEAN PAJAMAS to bed the night before surgery  Place CLEAN SHEETS on your bed the night before your surgery  DO NOT SLEEP WITH PETS.   Day of Surgery: Take a shower with CHG soap. Wear Clean/Comfortable clothing the morning of surgery Do not apply any deodorants/lotions.   Remember to brush your teeth WITH YOUR REGULAR TOOTHPASTE.    If you received a COVID test during your pre-op visit, it is requested that you wear a mask when out in public, stay away from anyone that may not be feeling well, and notify your surgeon if you develop symptoms. If you have been in contact with anyone that has tested positive in the last 10 days, please notify your surgeon.    Please read over the following fact sheets that you were given.

## 2022-04-24 ENCOUNTER — Encounter (HOSPITAL_COMMUNITY): Payer: Self-pay

## 2022-04-24 ENCOUNTER — Encounter (HOSPITAL_COMMUNITY)
Admission: RE | Admit: 2022-04-24 | Discharge: 2022-04-24 | Disposition: A | Discharge status: Home / Self Care | Payer: Medicare Other | Source: Ambulatory Visit | Attending: Neurological Surgery | Admitting: Neurological Surgery

## 2022-04-24 ENCOUNTER — Other Ambulatory Visit: Payer: Self-pay

## 2022-04-24 VITALS — BP 117/59 | HR 85 | Resp 18 | Ht 60.0 in | Wt 142.2 lb

## 2022-04-24 DIAGNOSIS — Z01818 Encounter for other preprocedural examination: Secondary | ICD-10-CM

## 2022-04-24 DIAGNOSIS — Z01812 Encounter for preprocedural laboratory examination: Secondary | ICD-10-CM | POA: Diagnosis present

## 2022-04-24 DIAGNOSIS — I25118 Atherosclerotic heart disease of native coronary artery with other forms of angina pectoris: Secondary | ICD-10-CM | POA: Insufficient documentation

## 2022-04-24 LAB — BASIC METABOLIC PANEL
Anion gap: 7 (ref 5–15)
BUN: 9 mg/dL (ref 8–23)
CO2: 28 mmol/L (ref 22–32)
Calcium: 8.7 mg/dL — ABNORMAL LOW (ref 8.9–10.3)
Chloride: 101 mmol/L (ref 98–111)
Creatinine, Ser: 0.95 mg/dL (ref 0.44–1.00)
GFR, Estimated: >60 mL/min (ref >60)
Glucose, Bld: 96 mg/dL (ref 70–99)
Potassium: 3.6 mmol/L (ref 3.5–5.1)
Sodium: 136 mmol/L (ref 135–145)

## 2022-04-24 LAB — CBC
HCT: 39.7 % (ref 36.0–46.0)
Hemoglobin: 12.4 g/dL (ref 12.0–15.0)
MCH: 32.4 pg (ref 26.0–34.0)
MCHC: 31.2 g/dL (ref 30.0–36.0)
MCV: 103.7 fL — ABNORMAL HIGH (ref 80.0–100.0)
Platelets: 191 10*3/uL (ref 150–400)
RBC: 3.83 MIL/uL — ABNORMAL LOW (ref 3.87–5.11)
RDW: 12.8 % (ref 11.5–15.5)
WBC: 5.6 10*3/uL (ref 4.0–10.5)
nRBC: 0 % (ref 0.0–0.2)

## 2022-04-24 LAB — TYPE AND SCREEN
ABO/RH(D): A POS
Antibody Screen: NEGATIVE

## 2022-04-24 LAB — SURGICAL PCR SCREEN
MRSA, PCR: NEGATIVE
Staphylococcus aureus: NEGATIVE

## 2022-04-24 NOTE — Progress Notes (Addendum)
PCP - Neelam Humphrey Rolls Cardiologist - denies  PPM/ICD - denies   Chest x-ray - n/a EKG - 05/07/21 Stress Test - denies ECHO - denies Cardiac Cath - denies  Sleep Study - denies   As of today, STOP taking any Aspirin (unless otherwise instructed by your surgeon) Aleve, Naproxen, Ibuprofen, Motrin, Advil, Goody's, BC's, all herbal medications, fish oil, and all vitamins.  ERAS Protcol -no   COVID TEST- not needed   Anesthesia review: no  Patient denies shortness of breath, fever, cough and chest pain at PAT appointment   All instructions explained to the patient, with a verbal understanding of the material. Patient agrees to go over the instructions while at home for a better understanding. Patient also instructed to self quarantine after being tested for COVID-19. The opportunity to ask questions was provided.

## 2022-04-29 ENCOUNTER — Encounter (INDEPENDENT_AMBULATORY_CARE_PROVIDER_SITE_OTHER): Payer: Self-pay

## 2022-04-30 ENCOUNTER — Other Ambulatory Visit: Payer: Self-pay

## 2022-04-30 ENCOUNTER — Encounter (HOSPITAL_COMMUNITY): Payer: Self-pay | Admitting: Neurological Surgery

## 2022-04-30 ENCOUNTER — Ambulatory Visit (HOSPITAL_COMMUNITY): Payer: Medicare Other | Admitting: Anesthesiology

## 2022-04-30 ENCOUNTER — Ambulatory Visit (HOSPITAL_BASED_OUTPATIENT_CLINIC_OR_DEPARTMENT_OTHER): Payer: Medicare Other | Admitting: Anesthesiology

## 2022-04-30 ENCOUNTER — Observation Stay (HOSPITAL_COMMUNITY)
Admission: RE | Admit: 2022-04-30 | Discharge: 2022-05-01 | Disposition: A | Discharge status: Home Health | Payer: Medicare Other | Attending: Neurological Surgery | Admitting: Neurological Surgery

## 2022-04-30 ENCOUNTER — Ambulatory Visit (HOSPITAL_COMMUNITY): Payer: Medicare Other

## 2022-04-30 ENCOUNTER — Ambulatory Visit (HOSPITAL_COMMUNITY)
Admission: RE | Disposition: A | Discharge status: Home Health | Payer: Self-pay | Source: Home / Self Care | Attending: Neurological Surgery

## 2022-04-30 DIAGNOSIS — Z85828 Personal history of other malignant neoplasm of skin: Secondary | ICD-10-CM | POA: Insufficient documentation

## 2022-04-30 DIAGNOSIS — M5416 Radiculopathy, lumbar region: Secondary | ICD-10-CM | POA: Diagnosis not present

## 2022-04-30 DIAGNOSIS — M4316 Spondylolisthesis, lumbar region: Principal | ICD-10-CM | POA: Insufficient documentation

## 2022-04-30 DIAGNOSIS — M797 Fibromyalgia: Secondary | ICD-10-CM | POA: Diagnosis not present

## 2022-04-30 DIAGNOSIS — Z87891 Personal history of nicotine dependence: Secondary | ICD-10-CM | POA: Diagnosis not present

## 2022-04-30 DIAGNOSIS — I10 Essential (primary) hypertension: Secondary | ICD-10-CM | POA: Insufficient documentation

## 2022-04-30 DIAGNOSIS — M48062 Spinal stenosis, lumbar region with neurogenic claudication: Principal | ICD-10-CM

## 2022-04-30 DIAGNOSIS — I2510 Atherosclerotic heart disease of native coronary artery without angina pectoris: Secondary | ICD-10-CM | POA: Diagnosis not present

## 2022-04-30 DIAGNOSIS — Z79899 Other long term (current) drug therapy: Secondary | ICD-10-CM | POA: Insufficient documentation

## 2022-04-30 DIAGNOSIS — M48061 Spinal stenosis, lumbar region without neurogenic claudication: Secondary | ICD-10-CM | POA: Diagnosis present

## 2022-04-30 HISTORY — PX: TRANSFORAMINAL LUMBAR INTERBODY FUSION W/ MIS 1 LEVEL: SHX6145

## 2022-04-30 SURGERY — MINIMALLY INVASIVE (MIS) TRANSFORAMINAL LUMBAR INTERBODY FUSION (TLIF) 1 LEVEL
Anesthesia: General | Laterality: Right

## 2022-04-30 MED ORDER — ONDANSETRON HCL 4 MG/2ML IJ SOLN
INTRAMUSCULAR | Status: DC | PRN
  Administered 2022-04-30: 4 mg via INTRAVENOUS

## 2022-04-30 MED ORDER — OXYCODONE HCL 5 MG PO TABS
ORAL_TABLET | ORAL | Status: AC
  Filled 2022-04-30: qty 2

## 2022-04-30 MED ORDER — CHLORHEXIDINE GLUCONATE CLOTH 2 % EX PADS: 6.0000 | MEDICATED_PAD | Freq: Once | CUTANEOUS | Status: DC

## 2022-04-30 MED ORDER — THROMBIN 5000 UNITS EX SOLR
OROMUCOSAL | Status: DC | PRN
  Administered 2022-04-30: 5 mL via TOPICAL

## 2022-04-30 MED ORDER — ONDANSETRON HCL 4 MG/2ML IJ SOLN: 4.0000 mg | Freq: Four times a day (QID) | INTRAMUSCULAR | Status: DC | PRN

## 2022-04-30 MED ORDER — ROCURONIUM BROMIDE 10 MG/ML (PF) SYRINGE
PREFILLED_SYRINGE | INTRAVENOUS | Status: DC | PRN
  Administered 2022-04-30: 50 mg via INTRAVENOUS
  Administered 2022-04-30: 30 mg via INTRAVENOUS
  Administered 2022-04-30: 50 mg via INTRAVENOUS

## 2022-04-30 MED ORDER — KETAMINE HCL 10 MG/ML IJ SOLN
INTRAMUSCULAR | Status: DC | PRN
  Administered 2022-04-30: 10 mg via INTRAVENOUS
  Administered 2022-04-30: 25 mg via INTRAVENOUS
  Administered 2022-04-30: 5 mg via INTRAVENOUS

## 2022-04-30 MED ORDER — CHLORHEXIDINE GLUCONATE 0.12 % MT SOLN
15.0000 mL | Freq: Once | OROMUCOSAL | Status: AC
  Administered 2022-04-30: 15 mL via OROMUCOSAL
  Filled 2022-04-30: qty 15

## 2022-04-30 MED ORDER — HYDROMORPHONE HCL 1 MG/ML IJ SOLN
INTRAMUSCULAR | Status: DC | PRN
  Administered 2022-04-30: .5 mg via INTRAVENOUS

## 2022-04-30 MED ORDER — HYDROMORPHONE HCL 1 MG/ML IJ SOLN
INTRAMUSCULAR | Status: AC
  Filled 2022-04-30: qty 1

## 2022-04-30 MED ORDER — PROPOFOL 10 MG/ML IV BOLUS
INTRAVENOUS | Status: AC
  Filled 2022-04-30: qty 20

## 2022-04-30 MED ORDER — EPHEDRINE SULFATE-NACL 50-0.9 MG/10ML-% IV SOSY
PREFILLED_SYRINGE | INTRAVENOUS | Status: DC | PRN
  Administered 2022-04-30: 5 mg via INTRAVENOUS

## 2022-04-30 MED ORDER — ACETAMINOPHEN 325 MG PO TABS
650.0000 mg | ORAL_TABLET | ORAL | Status: DC | PRN
  Administered 2022-04-30 – 2022-05-01 (×4): 650 mg via ORAL
  Filled 2022-04-30 (×4): qty 2

## 2022-04-30 MED ORDER — GABAPENTIN 300 MG PO CAPS
300.0000 mg | ORAL_CAPSULE | Freq: Three times a day (TID) | ORAL | Status: DC
  Administered 2022-04-30 (×2): 300 mg via ORAL
  Filled 2022-04-30 (×2): qty 1

## 2022-04-30 MED ORDER — FENTANYL CITRATE (PF) 250 MCG/5ML IJ SOLN
INTRAMUSCULAR | Status: AC
  Filled 2022-04-30: qty 5

## 2022-04-30 MED ORDER — OXYCODONE HCL 5 MG PO TABS
5.0000 mg | ORAL_TABLET | ORAL | Status: DC | PRN
  Administered 2022-05-01: 5 mg via ORAL
  Filled 2022-04-30: qty 1

## 2022-04-30 MED ORDER — FENTANYL CITRATE (PF) 100 MCG/2ML IJ SOLN
INTRAMUSCULAR | Status: AC
  Filled 2022-04-30: qty 2

## 2022-04-30 MED ORDER — FENTANYL CITRATE (PF) 100 MCG/2ML IJ SOLN
25.0000 ug | INTRAMUSCULAR | Status: DC | PRN
  Administered 2022-04-30 (×3): 50 ug via INTRAVENOUS

## 2022-04-30 MED ORDER — BUPIVACAINE LIPOSOME 1.3 % IJ SUSP
INTRAMUSCULAR | Status: AC
  Filled 2022-04-30: qty 20

## 2022-04-30 MED ORDER — PHENYLEPHRINE HCL (PRESSORS) 10 MG/ML IV SOLN
INTRAVENOUS | Status: AC
  Filled 2022-04-30: qty 1

## 2022-04-30 MED ORDER — LIDOCAINE-EPINEPHRINE 1 %-1:100000 IJ SOLN
INTRAMUSCULAR | Status: DC | PRN
  Administered 2022-04-30: 10 mL

## 2022-04-30 MED ORDER — FENTANYL CITRATE (PF) 250 MCG/5ML IJ SOLN
INTRAMUSCULAR | Status: DC | PRN
  Administered 2022-04-30 (×3): 50 ug via INTRAVENOUS
  Administered 2022-04-30: 100 ug via INTRAVENOUS

## 2022-04-30 MED ORDER — FUROSEMIDE 20 MG PO TABS: 20.0000 mg | ORAL_TABLET | Freq: Every day | ORAL | Status: DC

## 2022-04-30 MED ORDER — 0.9 % SODIUM CHLORIDE (POUR BTL) OPTIME
TOPICAL | Status: DC | PRN
  Administered 2022-04-30: 1000 mL

## 2022-04-30 MED ORDER — BUPIVACAINE LIPOSOME 1.3 % IJ SUSP
INTRAMUSCULAR | Status: DC | PRN
  Administered 2022-04-30: 20 mL

## 2022-04-30 MED ORDER — LACTATED RINGERS IV SOLN: INTRAVENOUS | Status: DC | PRN

## 2022-04-30 MED ORDER — ROCURONIUM BROMIDE 10 MG/ML (PF) SYRINGE
PREFILLED_SYRINGE | INTRAVENOUS | Status: AC
  Filled 2022-04-30: qty 10

## 2022-04-30 MED ORDER — SODIUM CHLORIDE 0.9% FLUSH: 3.0000 mL | INTRAVENOUS | Status: DC | PRN

## 2022-04-30 MED ORDER — DULOXETINE HCL 30 MG PO CPEP
60.0000 mg | ORAL_CAPSULE | Freq: Every day | ORAL | Status: DC
  Administered 2022-04-30: 60 mg via ORAL
  Filled 2022-04-30: qty 2

## 2022-04-30 MED ORDER — SPIRONOLACTONE 12.5 MG HALF TABLET
12.5000 mg | ORAL_TABLET | Freq: Every day | ORAL | Status: DC
  Filled 2022-04-30: qty 1

## 2022-04-30 MED ORDER — THROMBIN 5000 UNITS EX SOLR
CUTANEOUS | Status: AC
  Filled 2022-04-30: qty 10000

## 2022-04-30 MED ORDER — LIDOCAINE 2% (20 MG/ML) 5 ML SYRINGE
INTRAMUSCULAR | Status: DC | PRN
  Administered 2022-04-30: 60 mg via INTRAVENOUS

## 2022-04-30 MED ORDER — FENTANYL CITRATE (PF) 100 MCG/2ML IJ SOLN: 25.0000 ug | INTRAMUSCULAR | Status: DC | PRN

## 2022-04-30 MED ORDER — METHOCARBAMOL 1000 MG/10ML IJ SOLN: 500.0000 mg | Freq: Four times a day (QID) | INTRAVENOUS | Status: DC | PRN

## 2022-04-30 MED ORDER — BUPIVACAINE-EPINEPHRINE (PF) 0.5% -1:200000 IJ SOLN
INTRAMUSCULAR | Status: DC | PRN
  Administered 2022-04-30: 10 mL

## 2022-04-30 MED ORDER — ATORVASTATIN CALCIUM 10 MG PO TABS: 20.0000 mg | ORAL_TABLET | Freq: Every day | ORAL | Status: DC

## 2022-04-30 MED ORDER — BUPIVACAINE-EPINEPHRINE (PF) 0.5% -1:200000 IJ SOLN
INTRAMUSCULAR | Status: AC
  Filled 2022-04-30: qty 30

## 2022-04-30 MED ORDER — PHENOL 1.4 % MT LIQD: 1.0000 | OROMUCOSAL | Status: DC | PRN

## 2022-04-30 MED ORDER — ACETAMINOPHEN 650 MG RE SUPP: 650.0000 mg | RECTAL | Status: DC | PRN

## 2022-04-30 MED ORDER — PROPOFOL 10 MG/ML IV BOLUS
INTRAVENOUS | Status: DC | PRN
  Administered 2022-04-30: 100 mg via INTRAVENOUS

## 2022-04-30 MED ORDER — HYDROMORPHONE HCL 1 MG/ML IJ SOLN: 1.0000 mg | INTRAMUSCULAR | Status: DC | PRN

## 2022-04-30 MED ORDER — SODIUM CHLORIDE 0.9 % IV SOLN
250.0000 mL | INTRAVENOUS | Status: DC
  Administered 2022-04-30: 250 mL via INTRAVENOUS

## 2022-04-30 MED ORDER — PHENYLEPHRINE 80 MCG/ML (10ML) SYRINGE FOR IV PUSH (FOR BLOOD PRESSURE SUPPORT)
PREFILLED_SYRINGE | INTRAVENOUS | Status: DC | PRN
  Administered 2022-04-30 (×2): 80 ug via INTRAVENOUS

## 2022-04-30 MED ORDER — OXYCODONE HCL 5 MG PO TABS
10.0000 mg | ORAL_TABLET | ORAL | Status: DC | PRN
  Administered 2022-04-30 – 2022-05-01 (×4): 10 mg via ORAL
  Filled 2022-04-30 (×3): qty 2

## 2022-04-30 MED ORDER — LIDOCAINE-EPINEPHRINE 1 %-1:100000 IJ SOLN
INTRAMUSCULAR | Status: AC
  Filled 2022-04-30: qty 1

## 2022-04-30 MED ORDER — MELATONIN 5 MG PO TABS
10.0000 mg | ORAL_TABLET | Freq: Every day | ORAL | Status: DC
  Administered 2022-04-30: 10 mg via ORAL
  Filled 2022-04-30: qty 2

## 2022-04-30 MED ORDER — MENTHOL 3 MG MT LOZG: 1.0000 | LOZENGE | OROMUCOSAL | Status: DC | PRN

## 2022-04-30 MED ORDER — ENALAPRIL MALEATE 2.5 MG PO TABS
5.0000 mg | ORAL_TABLET | Freq: Every day | ORAL | Status: DC
  Administered 2022-04-30: 5 mg via ORAL
  Filled 2022-04-30 (×2): qty 2

## 2022-04-30 MED ORDER — LACTATED RINGERS IV SOLN: INTRAVENOUS | Status: DC

## 2022-04-30 MED ORDER — ACETAMINOPHEN 500 MG PO TABS: 1000.0000 mg | ORAL_TABLET | Freq: Once | ORAL | Status: DC

## 2022-04-30 MED ORDER — CEFAZOLIN SODIUM-DEXTROSE 2-4 GM/100ML-% IV SOLN
2.0000 g | Freq: Three times a day (TID) | INTRAVENOUS | Status: AC
  Administered 2022-04-30 (×2): 2 g via INTRAVENOUS
  Filled 2022-04-30 (×2): qty 100

## 2022-04-30 MED ORDER — PANTOPRAZOLE SODIUM 40 MG PO TBEC
80.0000 mg | DELAYED_RELEASE_TABLET | Freq: Every day | ORAL | Status: DC
  Administered 2022-04-30 – 2022-05-01 (×2): 80 mg via ORAL
  Filled 2022-04-30 (×2): qty 2

## 2022-04-30 MED ORDER — THROMBIN (RECOMBINANT) 5000 UNITS EX SOLR
CUTANEOUS | Status: DC | PRN
  Administered 2022-04-30: 10 mL via TOPICAL

## 2022-04-30 MED ORDER — ORAL CARE MOUTH RINSE: 15.0000 mL | Freq: Once | OROMUCOSAL | Status: AC

## 2022-04-30 MED ORDER — SODIUM CHLORIDE 0.9% FLUSH: 3.0000 mL | Freq: Two times a day (BID) | INTRAVENOUS | Status: DC

## 2022-04-30 MED ORDER — METHOCARBAMOL 500 MG PO TABS
500.0000 mg | ORAL_TABLET | Freq: Four times a day (QID) | ORAL | Status: DC | PRN
  Administered 2022-04-30 – 2022-05-01 (×2): 500 mg via ORAL
  Filled 2022-04-30 (×2): qty 1

## 2022-04-30 MED ORDER — SUGAMMADEX SODIUM 200 MG/2ML IV SOLN
INTRAVENOUS | Status: DC | PRN
  Administered 2022-04-30 (×2): 200 mg via INTRAVENOUS

## 2022-04-30 MED ORDER — PHENYLEPHRINE HCL-NACL 20-0.9 MG/250ML-% IV SOLN
INTRAVENOUS | Status: DC | PRN
  Administered 2022-04-30: 25 ug/min via INTRAVENOUS

## 2022-04-30 MED ORDER — THROMBIN 5000 UNITS EX SOLR
CUTANEOUS | Status: AC
  Filled 2022-04-30: qty 5000

## 2022-04-30 MED ORDER — ACETAMINOPHEN 500 MG PO TABS
1000.0000 mg | ORAL_TABLET | Freq: Once | ORAL | Status: AC
  Administered 2022-04-30: 1000 mg via ORAL
  Filled 2022-04-30: qty 2

## 2022-04-30 MED ORDER — CEFAZOLIN SODIUM-DEXTROSE 2-4 GM/100ML-% IV SOLN
2.0000 g | INTRAVENOUS | Status: AC
  Administered 2022-04-30: 2 g via INTRAVENOUS
  Filled 2022-04-30: qty 100

## 2022-04-30 MED ORDER — HYDROMORPHONE HCL 1 MG/ML IJ SOLN
INTRAMUSCULAR | Status: AC
  Filled 2022-04-30: qty 0.5

## 2022-04-30 MED ORDER — KETAMINE HCL 50 MG/5ML IJ SOSY
PREFILLED_SYRINGE | INTRAMUSCULAR | Status: AC
  Filled 2022-04-30: qty 5

## 2022-04-30 MED ORDER — ESTRADIOL 0.5 MG PO TABS
0.5000 mg | ORAL_TABLET | Freq: Every day | ORAL | Status: DC
  Administered 2022-04-30: 0.5 mg via ORAL
  Filled 2022-04-30 (×2): qty 1

## 2022-04-30 MED ORDER — HYDROMORPHONE HCL 1 MG/ML IJ SOLN
0.2500 mg | INTRAMUSCULAR | Status: DC | PRN
  Administered 2022-04-30 (×4): 0.5 mg via INTRAVENOUS

## 2022-04-30 MED ORDER — ONDANSETRON HCL 4 MG PO TABS: 4.0000 mg | ORAL_TABLET | Freq: Four times a day (QID) | ORAL | Status: DC | PRN

## 2022-04-30 MED ORDER — DOCUSATE SODIUM 100 MG PO CAPS
100.0000 mg | ORAL_CAPSULE | Freq: Two times a day (BID) | ORAL | Status: DC
  Administered 2022-04-30 – 2022-05-01 (×3): 100 mg via ORAL
  Filled 2022-04-30 (×3): qty 1

## 2022-04-30 SURGICAL SUPPLY — 80 items
ADH SKN CLS APL DERMABOND .7 (GAUZE/BANDAGES/DRESSINGS) ×1
BAG COUNTER SPONGE SURGICOUNT (BAG) ×1 IMPLANT
BAG SPNG CNTER NS LX DISP (BAG) ×1
BAND INSRT 18 STRL LF DISP RB (MISCELLANEOUS) ×2
BAND RUBBER #18 3X1/16 STRL (MISCELLANEOUS) ×2 IMPLANT
BASKET BONE COLLECTION (BASKET) ×1 IMPLANT
BUR MATCHSTICK NEURO 3.0 LAGG (BURR) ×1 IMPLANT
CAGE CONVEX CASCADIA 8.5X22X10 (Cage) IMPLANT
CNTNR URN SCR LID CUP LEK RST (MISCELLANEOUS) ×1 IMPLANT
CONT SPEC 4OZ STRL OR WHT (MISCELLANEOUS) ×1
COVER BACK TABLE 60X90IN (DRAPES) ×1 IMPLANT
COVER MAYO STAND STRL (DRAPES) ×1 IMPLANT
DERMABOND ADVANCED .7 DNX12 (GAUZE/BANDAGES/DRESSINGS) IMPLANT
DRAIN JACKSON RD 7FR 3/32 (WOUND CARE) IMPLANT
DRAPE C-ARM 42X72 X-RAY (DRAPES) ×2 IMPLANT
DRAPE C-ARMOR (DRAPES) IMPLANT
DRAPE LAPAROTOMY 100X72X124 (DRAPES) ×1 IMPLANT
DRAPE MICROSCOPE SLANT 54X150 (MISCELLANEOUS) ×1 IMPLANT
DRAPE STERI IOBAN 125X83 (DRAPES) IMPLANT
DRAPE SURG 17X23 STRL (DRAPES) ×1 IMPLANT
DRSG OPSITE POSTOP 4X6 (GAUZE/BANDAGES/DRESSINGS) IMPLANT
DURAPREP 26ML APPLICATOR (WOUND CARE) ×1 IMPLANT
ELECT BLADE INSULATED 6.5IN (ELECTROSURGICAL) ×1
ELECT REM PT RETURN 9FT ADLT (ELECTROSURGICAL) ×1
ELECTRODE BLDE INSULATED 6.5IN (ELECTROSURGICAL) ×1 IMPLANT
ELECTRODE REM PT RTRN 9FT ADLT (ELECTROSURGICAL) ×1 IMPLANT
GAUZE 4X4 16PLY ~~LOC~~+RFID DBL (SPONGE) IMPLANT
GAUZE SPONGE 4X4 12PLY STRL (GAUZE/BANDAGES/DRESSINGS) ×1 IMPLANT
GLOVE BIOGEL PI IND STRL 7.5 (GLOVE) IMPLANT
GLOVE BIOGEL PI IND STRL 8 (GLOVE) ×2 IMPLANT
GLOVE ECLIPSE 7.5 STRL STRAW (GLOVE) IMPLANT
GLOVE ECLIPSE 8.0 STRL XLNG CF (GLOVE) ×2 IMPLANT
GLOVE SURG ENC MOIS LTX SZ8 (GLOVE) ×1 IMPLANT
GLOVE SURG UNDER POLY LF SZ8.5 (GLOVE) ×1 IMPLANT
GOWN STRL REUS W/ TWL LRG LVL3 (GOWN DISPOSABLE) IMPLANT
GOWN STRL REUS W/ TWL XL LVL3 (GOWN DISPOSABLE) ×2 IMPLANT
GOWN STRL REUS W/TWL 2XL LVL3 (GOWN DISPOSABLE) IMPLANT
GOWN STRL REUS W/TWL LRG LVL3 (GOWN DISPOSABLE) ×2
GOWN STRL REUS W/TWL XL LVL3 (GOWN DISPOSABLE) ×2
GRAFT BONE PROTEIOS MED 2.5CC (Orthopedic Implant) IMPLANT
GUIDEWIRE EVEREST 1.4X620 2-PK (WIRE) IMPLANT
HEMOSTAT POWDER KIT SURGIFOAM (HEMOSTASIS) ×1 IMPLANT
KIT BASIN OR (CUSTOM PROCEDURE TRAY) ×1 IMPLANT
KIT POSITION SURG JACKSON T1 (MISCELLANEOUS) ×1 IMPLANT
KIT TURNOVER KIT B (KITS) ×1 IMPLANT
MARKER SKIN DUAL TIP RULER LAB (MISCELLANEOUS) ×1 IMPLANT
NDL BIOPSY DD SERENGETI 8G (NEEDLE) IMPLANT
NDL HYPO 21X1.5 SAFETY (NEEDLE) ×1 IMPLANT
NDL HYPO 25X1 1.5 SAFETY (NEEDLE) ×1 IMPLANT
NDL SPNL 22GX3.5 QUINCKE BK (NEEDLE) ×1 IMPLANT
NEEDLE BIOPSY DD SERENGETI 8G (NEEDLE) ×1 IMPLANT
NEEDLE HYPO 21X1.5 SAFETY (NEEDLE) ×1 IMPLANT
NEEDLE HYPO 25X1 1.5 SAFETY (NEEDLE) ×1 IMPLANT
NEEDLE SPNL 22GX3.5 QUINCKE BK (NEEDLE) ×1 IMPLANT
NS IRRIG 1000ML POUR BTL (IV SOLUTION) ×1 IMPLANT
PACK LAMINECTOMY NEURO (CUSTOM PROCEDURE TRAY) ×1 IMPLANT
PAD ARMBOARD 7.5X6 YLW CONV (MISCELLANEOUS) ×3 IMPLANT
PATTIES SURGICAL .5 X.5 (GAUZE/BANDAGES/DRESSINGS) IMPLANT
PATTIES SURGICAL .5 X1 (DISPOSABLE) IMPLANT
PATTIES SURGICAL 1X1 (DISPOSABLE) IMPLANT
PUTTY BONE 100 VESUVIUS 2.5CC (Putty) IMPLANT
ROD SPINAL BULLET NOSE 5.5X35 (Rod) IMPLANT
ROD SPINAL DENAIL 5.5X40 (Rod) IMPLANT
SCREW CANN EVEREST 7.5X50 (Screw) IMPLANT
SET SCREW (Screw) ×4 IMPLANT
SET SCREW VRST (Screw) IMPLANT
SPIKE FLUID TRANSFER (MISCELLANEOUS) ×1 IMPLANT
SPONGE SURGIFOAM ABS GEL SZ50 (HEMOSTASIS) ×1 IMPLANT
SPONGE T-LAP 4X18 ~~LOC~~+RFID (SPONGE) IMPLANT
STAPLER VISISTAT 35W (STAPLE) IMPLANT
SUT VIC AB 0 CT1 18XCR BRD8 (SUTURE) ×1 IMPLANT
SUT VIC AB 0 CT1 8-18 (SUTURE) ×1
SUT VIC AB 2-0 CP2 18 (SUTURE) ×1 IMPLANT
SUT VIC AB 3-0 SH 8-18 (SUTURE) ×1 IMPLANT
SYR 20CC LL (SYRINGE) ×1 IMPLANT
SYR 3ML LL SCALE MARK (SYRINGE) IMPLANT
TOWEL GREEN STERILE (TOWEL DISPOSABLE) ×1 IMPLANT
TOWEL GREEN STERILE FF (TOWEL DISPOSABLE) ×1 IMPLANT
TRAY FOLEY MTR SLVR 16FR STAT (SET/KITS/TRAYS/PACK) ×1 IMPLANT
WATER STERILE IRR 1000ML POUR (IV SOLUTION) ×1 IMPLANT

## 2022-04-30 NOTE — H&P (Deleted)
Providing Compassionate, Quality Care - Together  Date of service: 04/30/2022  PREOP DIAGNOSIS:  L4-5 degenerative spondylolisthesis, mobile, with severe stenosis of bilateral radiculopathy   POSTOP DIAGNOSIS: Same   PROCEDURE: Minimally invasive L 4-5 bilateral decompression and transforaminal lumbar interbody fusion and arthrodesis, right sided approach Bilateral L4, L5 nonsegmental pedicle screw instrumentation, K2 M Everest 7.5 x 50 mm bilateral L4, 7.5 x 50 mm bilaterally at L5 Placement of anterior biomechanical device, K2 M titanium [place] 10 x 22 interbody device Intraoperative use of autograft, same incision Intraoperative use of allograft  Intraoperative use of proteos Intraoperative use of fluoroscopy, greater than 1 hour Intraoperative use of microscope, for microdissection   SURGEON: Dr. Pieter Partridge C. Timberly Yott, DO   ASSISTANT: Dr. Ashok Pall, MD; Weston Brass, NP   ANESTHESIA: General Endotracheal   EBL: 100 cc   SPECIMENS: None   DRAINS: None   COMPLICATIONS: None   CONDITION: Hemodynamically stable   HISTORY: Jacqueline Thornton is a 71 y.o. y.o. female who initially presented to the outpatient clinic with signs and symptoms consistent with right greater than left L5 radiculopathy and worsening back pain. MRI demonstrated severe stenosis at L4-5 with grade 1 anterior listhesis.  Flexion-extension imaging showed mobile spondylolisthesis, worse in flexion.  She did have a history of an L5-S1 ALIF which appeared appropriately fused.she underwent multiple conservative measures without significant relief of her pain.  Treatment options were discussed including continued epidural steroid injections, pain control or surgical intervention in the form of an L4-5 right transforaminal lumbar interbody fusion and decompression. All risks, benefits and expected outcomes were discussed agreed upon.  Informed consent was obtained.   PROCEDURE IN DETAIL: The patient was brought to  the operating room. After induction of general anesthesia, the patient was positioned on the operative table in the prone position on the [place] table. All pressure points were meticulously padded. Skin incision was then marked out and prepped and draped in the usual sterile fashion. Physician driven timeout was performed.   Using biplanar fluoroscopy, the C arm for sterilely draped and brought into the field and the paramedian incisions were planned over the L4-L5 interspace.  Local anesthetic was injected bilaterally.  Using a 10 blade, paramedian incision was created bilaterally.  Using Jamshidi, the bilateral L4 pedicles were accessed under biplanar fluoroscopy.  K wires were placed and the Jamshidi's were removed.  This was repeated bilaterally at L5.  Attention was then turned to docking the Metrix dilator.  Using with a series of dilators, on the patient's right side the facet lamina junction was docked with a 26 x 6 mm tube.  This was confirmed to be in the appropriate trajectory under lateral fluoroscopy.  At this point the microscope was sterilely draped and brought into the field.   Using Bovie electrocautery, soft tissue was cleared from the lamina and facet at L4-5.  Using a high-speed drill and collecting the autograft, a laminectomy and complete facetectomy was performed down to the ligamentum flavum.  This autograft was saved for later.  The ligamentum flavum was identified to its attachment along the superior lamina and lateral pars.  The decompression was carried further contralaterally with a high-speed drill to the contralateral lateral recess.  The contralateral lateral recess ligamentum flavum was identified.  Using a series of micro curettes, the ligamentum flavum was gently elevated and the epidural space was identified.  The ligamentum flavum was completely resected using Kerrison rongeurs.  The traversing and exiting nerve roots were identified.  Using Kerrison rongeurs, the common  dural tube and traversing and exiting nerve roots were completely decompressed from the ligamentum flavum.  This was carried across midline to the contralateral lateral recess to decompress the contralateral L5 nerve root.  They appeared pulsatile and noncompressed.  Camden's triangle was identified, the traversing nerve root was gently retracted medially, and the epidural space was coagulated and cleared of epidural veins.  The disc space was identified.  The disc annulus was then coagulated and incised with an 11 blade.  Using Kerrison rongeurs the annulotomy was widened.  Using a series of disc prep shavers and curettes a radical discectomy was performed to subchondral bleeding bone at both endplates.  The disc base was then trialed and found to be in 10 mm interbody size.  A mixture of autograft and allograft was then placed anteriorly and medially and packed with a bone tamp.  Using a nerve root retractor, the traversing nerve root was gently protected during placement of the interbody.   Using lateral fluoroscopy, the interbody was then placed under live fluoroscopy.  Appropriate placement was identified.  The remainder of the autograft and allograft was then placed laterally and the intervertebral space to the interbody device.  Hemostasis was achieved with passive hemostatics and bipolar cautery.  The traversing nerve root, neural tube and exiting nerve root were followed with a ball-tipped probe and noted to be noncompressed, pulsatile and in their normal position.  A few pieces of Gelfoam with thrombin were placed over the thecal sac.  The Metrix dilator tube was gently removed and hemostasis was achieved with bipolar cautery and the soft tissues.   The above listed pedicle screws were then placed under lateral fluoroscopy over the K wires bilaterally at L4 and L5.  Appropriate sized rods were measured, contoured and placed percutaneously.  These were confirmed to be within the tulips, setscrews were  placed and final tightened to the manufacturer's recommendations.  Percutaneous towers were removed from the screws.  Final AP and lateral fluoroscopy images confirmed appropriate placement of all hardware.  Hemostasis was achieved with bipolar cautery.  The wound was closed in layers, 2-0 and 3-0 Vicryl sutures for the dermis.  Skin was closed with skin glue.  Sterile dressing was applied.   At the end of the case all sponge, needle, and instrument counts were correct. The patient was then transferred to the stretcher, extubated, and taken to the post-anesthesia care unit in stable hemodynamic condition.

## 2022-04-30 NOTE — H&P (Signed)
Providing Compassionate, Quality Care - Together  NEUROSURGERY HISTORY & PHYSICAL   Jacqueline Thornton is an 71 y.o. female.   Chief Complaint: Low back pain and right greater than left lower extremity radiculopathy HPI: This is a 71 year old female with a history of chronic low back pain and worsening bilateral, right greater than left lower extremity radiculopathy.  She has a history of an L5-S1 ALIF.  She failed conservative measures for pain control.  Her MRI revealed degenerative spondylolisthesis, grade 1 at L4-5 with severe stenosis due to ligamentum and facet hypertrophy.  She presents today for surgical intervention in the form of an L4-5 minimally invasive decompression, transforaminal lumbar interbody fusion.  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   Atypical face pain 04/24/2015   Back pain    CAD (coronary artery disease)    pt denies   Cancer (HCC)    squamous cell- R temple    Chronic back pain    Constipation    Depression    Fibromyalgia    GERD (gastroesophageal reflux disease)    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) 04/11/2015   H/O: hysterectomy 1979   Heart murmur    Hyperlipidemia    Hypertension    Low back pain 04/06/2015   Lumbar radicular pain 04/18/2015   Migraine    Narrowing of intervertebral disc space 04/11/2015   Sacroiliac joint pain 04/11/2015   Spine disorder     Past Surgical History:  Procedure Laterality Date   ABDOMINAL EXPOSURE N/A 12/11/2018   Procedure: ABDOMINAL EXPOSURE;  Surgeon: Serafina Mitchell, MD;  Location: [place];  Service: Vascular;  Laterality: N/A;   ABDOMINAL HYSTERECTOMY     ANTERIOR CERVICAL DECOMP/DISCECTOMY FUSION N/A 10/06/2020   Procedure: Cervical three -four Anterior cervical decompression/discectomy/fusion  with possible anchor c and  exploration of fusion Cervical four to Six;  Surgeon: Erline Levine, MD;  Location: [facility];  Service: Neurosurgery;  Laterality: N/A;   ANTERIOR LUMBAR FUSION N/A 12/11/2018   Procedure: Lumbar five Sacral one Anterior lumbar interbody fusion;  Surgeon: Erline Levine, MD;  Location: [place];  Service: Neurosurgery;  Laterality: N/A;   APPENDECTOMY     Age19   caract surger     02/24/17   CHOLECYSTECTOMY     COLONOSCOPY WITH PROPOFOL N/A 07/19/2015   Procedure: COLONOSCOPY WITH PROPOFOL;  Surgeon: Manya Silvas, MD;  Location: [facility]COPY;  Service: Endoscopy;  Laterality: N/A;   COLONOSCOPY WITH PROPOFOL N/A 09/11/2021   Procedure: COLONOSCOPY WITH PROPOFOL;  Surgeon: Lesly Rubenstein, MD;  Location: [facility] ENDOSCOPY;  Service: Endoscopy;  Laterality: N/A;   COLONOSCOPY WITH PROPOFOL N/A 03/05/2022   Procedure: COLONOSCOPY WITH PROPOFOL;  Surgeon: Lesly Rubenstein, MD;  Location: [facility] ENDOSCOPY;  Service: Endoscopy;  Laterality: N/A;   CYSTOSCOPY  03/03/2018   Procedure: CYSTOSCOPY;  Surgeon: Will Bonnet, MD;  Location: [facility] ORS;  Service: Gynecology;;   ESOPHAGOGASTRODUODENOSCOPY (EGD) WITH PROPOFOL N/A 09/11/2021   Procedure: ESOPHAGOGASTRODUODENOSCOPY (EGD) WITH PROPOFOL;  Surgeon: Lesly Rubenstein, MD;  Location: [facility] ENDOSCOPY;  Service: Endoscopy;  Laterality: N/A;   LAPAROSCOPIC SALPINGO OOPHERECTOMY Bilateral 03/03/2018   Procedure: LAPAROSCOPIC SALPINGO OOPHORECTOMY;  Surgeon: Will Bonnet, MD;  Location: [facility] ORS;  Service: Gynecology;  Laterality: Bilateral;   NECK SURGERY  Age 51 and 44.   TOE FUSION Right    TONSILLECTOMY     Age 36   TUMOR REMOVAL     Ovaries    Family History  Problem Relation Age of Onset   Diabetes Father    Hypertension Sister    Diabetes Mother    Stroke Mother    Colon cancer Mother    Colon cancer Maternal Grandmother    Ovarian cancer Neg Hx    Breast cancer Neg Hx    Social History:  reports that she quit smoking  about 18 years ago. Her smoking use included cigarettes. She has never used smokeless tobacco. She reports that she does not drink alcohol and does not use drugs.  Allergies: No Known Allergies  Medications Prior to Admission  Medication Sig Dispense Refill   atorvastatin (LIPITOR) 20 MG tablet Take 20 mg by mouth daily with lunch.     Calcium-Vitamin D-Vitamin K (VIACTIV CALCIUM PLUS D) 650-12.5-40 MG-MCG-MCG CHEW Chew 1 tablet by mouth in the morning and at bedtime. Morning & supper     Cyanocobalamin (VITAMIN B12 PO) Take 1 tablet by mouth in the morning. vitafusion B12 vitamins     DULoxetine (CYMBALTA) 60 MG capsule Take 60 mg by mouth at bedtime.      enalapril (VASOTEC) 5 MG tablet Take 5 mg by mouth at bedtime.     estradiol (ESTRACE) 0.5 MG tablet Take 0.5 mg by mouth at bedtime.      furosemide (LASIX) 20 MG tablet Take 20 mg by mouth daily with lunch.     gabapentin (NEURONTIN) 300 MG capsule Take 300 mg by mouth 3 (three) times daily.     ibuprofen (ADVIL) 200 MG tablet Take 200-400 mg by mouth 2 (two) times daily as needed (pain.).     Melatonin 10 MG TABS Take 10 mg by mouth at bedtime.     methocarbamol (ROBAXIN) 500 MG tablet Take 500 mg by mouth in the morning and at bedtime.     Multiple Vitamin (MULTIVITAMIN WITH MINERALS) TABS tablet Take 1 tablet by mouth daily after supper. Women's Multivitamin (Gummy)     omeprazole (PRILOSEC) 40 MG capsule Take 40 mg by mouth daily before breakfast.     oxyCODONE-acetaminophen (PERCOCET) 7.5-325 MG tablet Take 1 tablet by mouth 5 (five) times daily. Must last 30 days 150 tablet 0   spironolactone (ALDACTONE) 25 MG tablet Take 12.5 mg by mouth daily with lunch.     Ferrous Sulfate (IRON) 325 (65 Fe) MG TABS Take 325 mg by mouth daily.     naloxone (NARCAN) nasal spray 4 mg/0.1 mL Place 1 spray into the nose as needed for up to 365 doses (for opioid-induced respiratory depresssion). In case of emergency (overdose), spray once into each  nostril. If no response within 3 minutes, repeat application and call [phone]. (Patient not taking: Reported on 04/18/2022) 1 each 0   [START ON 05/11/2022] oxyCODONE-acetaminophen (PERCOCET) 7.5-325 MG tablet Take 1 tablet by mouth 5 (five) times daily. Must last 30 days 150 tablet 0   [START ON 06/10/2022] oxyCODONE-acetaminophen (PERCOCET) 7.5-325 MG tablet Take 1 tablet by mouth 5 (five) times daily. Must last 30 days 150 tablet 0    No results found for this or any previous visit (from the past 48 hour(s)). No results found.  ROS All pertinent positives and negatives are listed HPI above   Blood pressure (!) 115/97, pulse 82, temperature 98.6 F (37 C), temperature source Oral, resp. rate  18, height 5' (1.524 m), weight 64.4 kg, SpO2 97 %. Physical Exam  Awake alert oriented x3 PERRLA Cranial nerves II through XII intact Full strength in upper and lower extremity throughout Nonlabored breathing   Assessment/Plan 71 year old female with  L4-5 degenerative spondylolisthesis with severe stenosis and radiculopathy   -OR today for minimally invasive L4-5 decompression, transforaminal lumbar interbody fusion.  We discussed all risks, benefits and expected outcomes as well as alternatives to treatment.  She has failed conservative measures.  Informed consent was obtained.  I answered all of her questions as well as her husband's questions. Informed consent was obtained.   Thank you for allowing me to participate in this patient's care.  Please do not hesitate to call with questions or concerns.   Elwin Sleight, [facility] Cell: [phone]

## 2022-04-30 NOTE — Anesthesia Preprocedure Evaluation (Signed)
Anesthesia Evaluation  Patient identified by MRN, date of birth, ID band Patient awake    Reviewed: Allergy & Precautions, NPO status , Patient's Chart, lab work & pertinent test results  Airway Mallampati: II  TM Distance: >3 FB Neck ROM: Full    Dental  (+) Dental Advisory Given   Pulmonary former smoker,    breath sounds clear to auscultation       Cardiovascular hypertension, Pt. on medications  Rhythm:Regular Rate:Normal     Neuro/Psych  Headaches,  Neuromuscular disease    GI/Hepatic Neg liver ROS, GERD  ,  Endo/Other  negative endocrine ROS  Renal/GU negative Renal ROS     Musculoskeletal  (+) Arthritis , Fibromyalgia -, narcotic dependent  Abdominal   Peds  Hematology negative hematology ROS (+)   Anesthesia Other Findings   Reproductive/Obstetrics                             Lab Results  Component Value Date   WBC 5.6 04/24/2022   HGB 12.4 04/24/2022   HCT 39.7 04/24/2022   MCV 103.7 (H) 04/24/2022   PLT 191 04/24/2022   Lab Results  Component Value Date   CREATININE 0.95 04/24/2022   BUN 9 04/24/2022   NA 136 04/24/2022   K 3.6 04/24/2022   CL 101 04/24/2022   CO2 28 04/24/2022    Anesthesia Physical Anesthesia Plan  ASA: 3  Anesthesia Plan: General   Post-op Pain Management: Tylenol PO (pre-op)*, Gabapentin PO (pre-op)*, Ketamine IV*, Dilaudid IV and Toradol IV (intra-op)*   Induction: Intravenous  PONV Risk Score and Plan: 3 and Dexamethasone, Ondansetron, Midazolam and Treatment may vary due to age or medical condition  Airway Management Planned: Oral ETT  Additional Equipment:   Intra-op Plan:   Post-operative Plan: Extubation in OR  Informed Consent: I have reviewed the patients History and Physical, chart, labs and discussed the procedure including the risks, benefits and alternatives for the proposed anesthesia with the patient or authorized  representative who has indicated his/her understanding and acceptance.     Dental advisory given  Plan Discussed with: CRNA  Anesthesia Plan Comments:         Anesthesia Quick Evaluation

## 2022-04-30 NOTE — Anesthesia Procedure Notes (Signed)
Procedure Name: Intubation Date/Time: 04/30/2022 9:51 AM  Performed by: Georgia Duff, CRNAPre-anesthesia Checklist: Patient identified, Emergency Drugs available, Suction available and Patient being monitored Patient Re-evaluated:Patient Re-evaluated prior to induction Oxygen Delivery Method: Circle System Utilized Preoxygenation: Pre-oxygenation with 100% oxygen Induction Type: IV induction Ventilation: Mask ventilation without difficulty Laryngoscope Size: Miller and 2 Grade View: Grade I Tube type: Oral Tube size: 7.0 mm Number of attempts: 1 Airway Equipment and Method: Stylet and Oral airway Placement Confirmation: ETT inserted through vocal cords under direct vision, positive ETCO2 and breath sounds checked- equal and bilateral Secured at: 19 cm Tube secured with: Tape Dental Injury: Teeth and Oropharynx as per pre-operative assessment

## 2022-04-30 NOTE — Transfer of Care (Signed)
Immediate Anesthesia Transfer of Care Note  Patient: Jacqueline Thornton  Procedure(s) Performed: Minimally Invasive  Decompression and  Transforaminal Lumbar Interbody Fusion Lumbar Four-Five (Right)  Patient Location: PACU  Anesthesia Type:General  Level of Consciousness: drowsy  Airway & Oxygen Therapy: Patient Spontanous Breathing and Patient connected to nasal cannula oxygen  Post-op Assessment: Report given to RN and Post -op Vital signs reviewed and stable  Post vital signs: Reviewed and stable  Last Vitals:  Vitals Value Taken Time  BP 95/53 04/30/22 1305  Temp    Pulse 93 04/30/22 1308  Resp 18 04/30/22 1311  SpO2 96 % 04/30/22 1308  Vitals shown include unvalidated device data.  Last Pain:  Vitals:   04/30/22 0816  TempSrc:   PainSc: 8       Patients Stated Pain Goal: 3 (99/06/89 3406)  Complications: No notable events documented.

## 2022-05-01 DIAGNOSIS — M4316 Spondylolisthesis, lumbar region: Secondary | ICD-10-CM | POA: Diagnosis not present

## 2022-05-01 MED ORDER — OXYCODONE-ACETAMINOPHEN 10-325 MG PO TABS: 1.0000 | ORAL_TABLET | ORAL | 0 refills | Status: DC | PRN

## 2022-05-01 MED ORDER — METHOCARBAMOL 500 MG PO TABS: 500.0000 mg | ORAL_TABLET | Freq: Four times a day (QID) | ORAL | 3 refills | Status: DC

## 2022-05-01 MED FILL — Thrombin For Soln 5000 Unit: CUTANEOUS | Qty: 2 | Status: AC

## 2022-05-01 NOTE — Care Management CC44 (Signed)
Condition Code 44 Documentation Completed  Patient Details  Name: Jacqueline Thornton MRN: [id number] Date of Birth: Jul 05, 1950   Condition Code 44 given:  Yes Patient signature on Condition Code 44 notice:  Yes Documentation of 2 MD's agreement:  Yes Code 44 added to claim:  Yes    Pollie Friar, RN 05/01/2022, 8:13 AM

## 2022-05-01 NOTE — Progress Notes (Signed)
Patient  denies dizziness Vital sign stable  see flowsheet .Doctor Dawley notified. MD okay to d/c patient home. Surgical site clean and dry no sign of infection. Pt. Void. D/c instructions explain and given to the patient all questions. Will d/c patient home per order

## 2022-05-01 NOTE — Plan of Care (Signed)
  Problem: Education: Goal: Knowledge of the prescribed therapeutic regimen will improve Outcome: Completed/Met

## 2022-05-01 NOTE — Anesthesia Postprocedure Evaluation (Signed)
Anesthesia Post Note  Patient: Jacqueline Thornton  Procedure(s) Performed: Minimally Invasive  Decompression and  Transforaminal Lumbar Interbody Fusion Lumbar Four-Five (Right)     Patient location during evaluation: PACU Anesthesia Type: General Level of consciousness: awake and alert Pain management: pain level controlled Vital Signs Assessment: post-procedure vital signs reviewed and stable Respiratory status: spontaneous breathing, nonlabored ventilation, respiratory function stable and patient connected to nasal cannula oxygen Cardiovascular status: blood pressure returned to baseline and stable Postop Assessment: no apparent nausea or vomiting Anesthetic complications: no   No notable events documented.  Last Vitals:  Vitals:   05/01/22 0939 05/01/22 1055  BP: (!) 117/49 (!) 126/52  Pulse: 84 88  Resp:  16  Temp:  36.9 C  SpO2: 97% 95%    Last Pain:  Vitals:   05/01/22 1055  TempSrc: Oral  PainSc:                  March Rummage Almarie Kurdziel

## 2022-05-01 NOTE — Discharge Summary (Signed)
Physician Discharge Summary  Patient ID: Jacqueline Thornton MRN: [id number] DOB/AGE: Mar 11, 1951 71 y.o.  Admit date: 04/30/2022 Discharge date: 05/01/2022  Admission Diagnoses: L4-5 degenerative spondylolisthesis, mobile, with severe stenosis of bilateral radiculopathy  Discharge Diagnoses: L4-5 degenerative spondylolisthesis, mobile, with severe stenosis of bilateral radiculopathy  Principal Problem:   Lumbar spinal stenosis   Discharged Condition: good  Hospital Course: The patient was admitted on 04/30/2022 and taken to the operating room where the patient underwent MIS TLIF right L4/5. The patient tolerated the procedure well and was taken to the recovery room and then to the floor in stable condition. The hospital course was routine. There were no complications. The wound remained clean dry and intact. Pt had appropriate back soreness. No complaints of leg pain or new N/T/W. The patient remained afebrile with stable vital signs, and tolerated a regular diet. The patient continued to increase activities, and pain was well controlled with oral pain medications.   Consults: None  Significant Diagnostic Studies: radiology: X-Ray: intraoperative   Treatments: surgery:  Minimally invasive L 4-5 bilateral decompression and transforaminal lumbar interbody fusion and arthrodesis, right sided approach Bilateral L4, L5 nonsegmental pedicle screw instrumentation, K2 M [place] 7.5 x 50 mm bilateral L4, 7.5 x 50 mm bilaterally at L5 Placement of anterior biomechanical device, K2 M titanium [place] 10 x 22 interbody device Intraoperative use of autograft, same incision Intraoperative use of allograft  Intraoperative use of proteos Intraoperative use of fluoroscopy, greater than 1 hour Intraoperative use of microscope, for microdissection    Discharge Exam: Blood pressure (!) 98/44, pulse 89, temperature 99.8 F (37.7 C), temperature source Oral, resp. rate 18, height 5' (1.524 m), weight  64.4 kg, SpO2 94 %. Physical Exam: Patient is awake, A/O X 4, conversant, and in good spirits. Eyes open spontaneously. They are in NAD and VSS. Doing well. Speech is fluent and appropriate. MAEW with good strength that is symmetric bilaterally.  BUE 5/5 throughout, BLE 5/5 throughout. Sensation to light touch is intact. PERLA, EOMI. CNs grossly intact. Dressing is clean dry intact. Incision is well approximated with no drainage, erythema, or edema.     Disposition: Discharge disposition: 01-Home or Self Care       Discharge Instructions     Face-to-face encounter (required for Medicare/Medicaid patients)   Complete by: As directed    I Marvis Moeller certify that this patient is under my care and that I, or a nurse practitioner or physician's assistant working with me, had a face-to-face encounter that meets the physician face-to-face encounter requirements with this patient on 05/01/2022. The encounter with the patient was in whole, or in part for the following medical condition(s) which is the primary reason for home health care (List medical condition):  L4-5 degenerative spondylolisthesis, mobile, with severe stenosis of bilateral radiculopathy   The encounter with the patient was in whole, or in part, for the following medical condition, which is the primary reason for home health care: L4-5 degenerative spondylolisthesis, mobile, with severe stenosis of bilateral radiculopathy   I certify that, based on my findings, the following services are medically necessary home health services: Physical therapy   Reason for Medically Necessary Home Health Services:  Therapy- Personnel officer, Public librarian Therapy- Home Adaptation to Facilitate Safety Therapy- Therapeutic Exercises to Increase Strength and Endurance     My clinical findings support the need for the above services: Pain interferes with ambulation/mobility   Further, I certify that my clinical findings  support that  this patient is homebound due to: Ambulates short distances less than 300 feet   Home Health   Complete by: As directed    To provide the following care/treatments:  PT OT     Incentive spirometry RT   Complete by: As directed       Allergies as of 05/01/2022   No Known Allergies      Medication List     STOP taking these medications    ibuprofen 200 MG tablet Commonly known as: ADVIL       TAKE these medications    atorvastatin 20 MG tablet Commonly known as: LIPITOR Take 20 mg by mouth daily with lunch.   DULoxetine 60 MG capsule Commonly known as: CYMBALTA Take 60 mg by mouth at bedtime.   enalapril 5 MG tablet Commonly known as: VASOTEC Take 5 mg by mouth at bedtime.   estradiol 0.5 MG tablet Commonly known as: ESTRACE Take 0.5 mg by mouth at bedtime.   furosemide 20 MG tablet Commonly known as: LASIX Take 20 mg by mouth daily with lunch.   gabapentin 300 MG capsule Commonly known as: NEURONTIN Take 300 mg by mouth 3 (three) times daily.   Iron 325 (65 Fe) MG Tabs Take 325 mg by mouth daily.   Melatonin 10 MG Tabs Take 10 mg by mouth at bedtime.   methocarbamol 500 MG tablet Commonly known as: ROBAXIN Take 500 mg by mouth in the morning and at bedtime. What changed: Another medication with the same name was added. Make sure you understand how and when to take each.   methocarbamol 500 MG tablet Commonly known as: ROBAXIN Take 1 tablet (500 mg total) by mouth 4 (four) times daily. What changed: You were already taking a medication with the same name, and this prescription was added. Make sure you understand how and when to take each.   multivitamin with minerals Tabs tablet Take 1 tablet by mouth daily after supper. Women's Multivitamin (Gummy)   naloxone 4 MG/0.1ML Liqd nasal spray kit Commonly known as: NARCAN Place 1 spray into the nose as needed for up to 365 doses (for opioid-induced respiratory depresssion). In case of  emergency (overdose), spray once into each nostril. If no response within 3 minutes, repeat application and call 110.   omeprazole 40 MG capsule Commonly known as: PRILOSEC Take 40 mg by mouth daily before breakfast.   oxyCODONE-acetaminophen 7.5-325 MG tablet Commonly known as: Percocet Take 1 tablet by mouth 5 (five) times daily. Must last 30 days What changed: Another medication with the same name was added. Make sure you understand how and when to take each.   oxyCODONE-acetaminophen 10-325 MG tablet Commonly known as: Percocet Take 1 tablet by mouth every 4 (four) hours as needed for pain. What changed: You were already taking a medication with the same name, and this prescription was added. Make sure you understand how and when to take each.   oxyCODONE-acetaminophen 7.5-325 MG tablet Commonly known as: Percocet Take 1 tablet by mouth 5 (five) times daily. Must last 30 days Start taking on: May 11, 2022 What changed: Another medication with the same name was added. Make sure you understand how and when to take each.   oxyCODONE-acetaminophen 7.5-325 MG tablet Commonly known as: Percocet Take 1 tablet by mouth 5 (five) times daily. Must last 30 days Start taking on: June 10, 2022 What changed: Another medication with the same name was added. Make sure you understand how and when to take each.  spironolactone 25 MG tablet Commonly known as: ALDACTONE Take 12.5 mg by mouth daily with lunch.   Viactiv Calcium Plus D 650-12.5-40 MG-MCG-MCG Chew Generic drug: Calcium-Vitamin D-Vitamin K Chew 1 tablet by mouth in the morning and at bedtime. Morning & supper   VITAMIN B12 PO Take 1 tablet by mouth in the morning. vitafusion B12 vitamins        Follow-up Information     Dawley, Troy C, DO. Schedule an appointment as soon as possible for a visit in 2 week(s).   Contact information: [address] [place] [address] [phone]                  Signed: Marvis Moeller 05/01/2022, 8:10 AM

## 2022-05-01 NOTE — TOC Transition Note (Signed)
Transition of Care [facility]) - CM/SW Discharge Note   Patient Details  Name: Jacqueline Thornton MRN: [id number] Date of Birth: [birth year]  Transition of Care [facility]) CM/SW Contact:  Pollie Friar, RN Phone Number: 05/01/2022, 9:35 AM   Clinical Narrative:    Pt is discharging home with home health services through [place]. Information on the AVS.  Pt has transportation home.    Final next level of care: [place] Barriers to Discharge: No Barriers Identified   Patient Goals and CMS Choice        Discharge Placement                       Discharge Plan and Services                          HH Arranged: PT, OT HH Agency: [place] Date [place]: 05/01/22   Representative spoke with at [place]: Amy  Social Determinants of Health ([place]) Interventions     Readmission Risk Interventions     No data to display

## 2022-05-01 NOTE — Evaluation (Signed)
Occupational Therapy Evaluation Patient Details Name: Jacqueline Thornton MRN: [id number] DOB: 07-31-1950 Today's Date: 05/01/2022   History of Present Illness 71 y/o female s/p Minimally invasive L 4-5 bilateral decompression and TLIF on 10/31. PMHx: anemia, anxiety, CAD, cancer, cervical fusion, HTN   Clinical Impression   Jacqueline Thornton was evaluated s/p the above admission list, she is generally mod I at baseline and her husband assists with IADLs. Upon evaluation pt was limited by pain and general knowledge of back precautions and compensatory techniques. Overall she required verbal cues throughout to maintain back precautions but no physical assist required. Generalized superivsion A provided with RW for all mobility, transfers and ADLs. Pt does not have further acute OT needs. Recommend d/c to home with [place].      Recommendations for follow up therapy are one component of a multi-disciplinary discharge planning process, led by the attending physician.  Recommendations may be updated based on patient status, additional functional criteria and insurance authorization.   Follow Up Recommendations  Home health OT    Assistance Recommended at Discharge Frequent or constant Supervision/Assistance  Patient can return home with the following A little help with walking and/or transfers;A little help with bathing/dressing/bathroom;Assistance with cooking/housework;Assist for transportation;Help with stairs or ramp for entrance    Functional Status Assessment  Patient has had a recent decline in their functional status and demonstrates the ability to make significant improvements in function in a reasonable and predictable amount of time.  Equipment Recommendations  None recommended by OT    Recommendations for Other Services       Precautions / Restrictions Precautions Precautions: Back;Fall Precaution Booklet Issued: Yes (comment) Precaution Comments: watch BP Restrictions Weight Bearing  Restrictions: No      Mobility Bed Mobility Overal bed mobility: Needs Assistance             General bed mobility comments: sitting EOB upon arrival, reviewed log roll    Transfers Overall transfer level: Needs assistance Equipment used: Rolling walker (2 wheels) Transfers: Sit to/from Stand Sit to Stand: Supervision           General transfer comment: cues for hand placement      Balance Overall balance assessment: Needs assistance Sitting-balance support: Feet supported Sitting balance-Leahy Scale: Good     Standing balance support: No upper extremity supported, During functional activity Standing balance-Leahy Scale: Fair Standing balance comment: groomed at the sink without UE support                           ADL either performed or assessed with clinical judgement   ADL Overall ADL's : Needs assistance/impaired Eating/Feeding: Independent;Sitting   Grooming: Supervision/safety;Standing;Cueing for compensatory techniques   Upper Body Bathing: Set up;Cueing for compensatory techniques   Lower Body Bathing: Min guard;Cueing for sequencing;Cueing for compensatory techniques;Sit to/from stand   Upper Body Dressing : Set up;Sitting   Lower Body Dressing: Min guard;Cueing for sequencing;Cueing for compensatory techniques;Cueing for safety;Sit to/from stand   Toilet Transfer: Supervision/safety;Cueing for [employer];Ambulation;Rolling walker (2 wheels)   Toileting- Clothing Manipulation and Hygiene: Supervision/safety;Sitting/lateral lean       Functional mobility during ADLs: Supervision/safety;Rolling walker (2 wheels);Cueing for safety General ADL Comments: no physical assist required, verbal cues given throughout for compensatory techniques to maintain back precautions     Vision Baseline Vision/History: 1 Wears glasses Vision Assessment?: No apparent visual deficits            Pertinent Vitals/Pain Pain Assessment  Pain  Assessment: Faces Faces Pain Scale: Hurts little more Pain Location: back Pain Descriptors / Indicators: Discomfort Pain Intervention(s): Limited activity within patient's tolerance, Monitored during session     Hand Dominance Right   Extremity/Trunk Assessment Upper Extremity Assessment Upper Extremity Assessment: Defer to OT evaluation   Lower Extremity Assessment Lower Extremity Assessment: Generalized weakness   Cervical / Trunk Assessment Cervical / Trunk Assessment: Back Surgery   Communication Communication Communication: No difficulties   Cognition Arousal/Alertness: Awake/alert Behavior During Therapy: WFL for tasks assessed/performed Overall Cognitive Status: Within Functional Limits for tasks assessed                                 General Comments: good carryover of back precautiosn after review     General Comments  VSS on RA, no family present. BP soft but stable, RN aware    Exercises     Shoulder Instructions      Home Living Family/patient expects to be discharged to:: Private residence Living Arrangements: Spouse/significant other Available Help at Discharge: Family Type of Home: House Home Access: Stairs to enter Technical brewer of Steps: 2 Entrance Stairs-Rails: Right Home Layout: Laundry or work area in basement;Able to live on main level with bedroom/bathroom     Bathroom Shower/Tub: Occupational psychologist: Handicapped height     Home Equipment: [place] (4 wheels);Cane - single point          Prior Functioning/Environment Prior Level of Function : Independent/Modified Independent             Mobility Comments: no AD ADLs Comments: generally indep with BADLs, husband drives and completes IADLs        OT Problem List: Decreased strength;Decreased range of motion;Decreased activity tolerance;Impaired balance (sitting and/or standing);Decreased safety awareness;Decreased knowledge of use of DME  or AE;Decreased knowledge of precautions;Pain         OT Goals(Current goals can be found in the care plan section) Acute Rehab OT Goals Patient Stated Goal: home OT Goal Formulation: With patient Time For Goal Achievement: 05/15/22 Potential to Achieve Goals: Good   AM-PAC OT "6 Clicks" Daily Activity     Outcome Measure Help from another person eating meals?: None Help from another person taking care of personal grooming?: A Little Help from another person toileting, which includes using toliet, bedpan, or urinal?: A Little Help from another person bathing (including washing, rinsing, drying)?: A Little Help from another person to put on and taking off regular upper body clothing?: A Little Help from another person to put on and taking off regular lower body clothing?: A Little 6 Click Score: 19   End of Session Equipment Utilized During Treatment: Rolling walker (2 wheels) Nurse Communication: Mobility status  Activity Tolerance: Patient tolerated treatment well Patient left: in bed  OT Visit Diagnosis: Unsteadiness on feet (R26.81);Other abnormalities of gait and mobility (R26.89);Pain                Time: 8841-6606 OT Time Calculation (min): 22 min Charges:  OT General Charges $OT Visit: 1 Visit OT Evaluation $OT Eval Moderate Complexity: 1 Mod    Tahiry Spicer D Causey 05/01/2022, 10:00 AM

## 2022-05-01 NOTE — Evaluation (Signed)
Physical Therapy Evaluation Patient Details Name: Jacqueline Thornton MRN: [id number] DOB: [birth year] Today's Date: 05/01/2022  History of Present Illness  71 y/o female s/p Minimally invasive L 4-5 bilateral decompression and TLIF on 10/31. PMHx: anemia, anxiety, CAD, cancer, cervical fusion, HTN  Clinical Impression  Patient admitted following above procedure. PTA, patient lives with husband who assists her but able to ambulate with no AD. Educated patient on back precautions, progressive walking program, and car transfer, patient verbalized understanding. Patient required min guard for bed mobility, transfers, and ambulation with RW. Able to negotiate 2 stairs with R rail and min guard to safely access home environment. Soft BP earlier this AM but improved at end of session. Patient will benefit from skilled PT services during acute stay to address listed deficits. Recommend HHPT at discharge to maximize functional independence and safety.        Recommendations for follow up therapy are one component of a multi-disciplinary discharge planning process, led by the attending physician.  Recommendations may be updated based on patient status, additional functional criteria and insurance authorization.  Follow Up Recommendations Home health PT      Assistance Recommended at Discharge Intermittent Supervision/Assistance  Patient can return home with the following  A little help with walking and/or transfers;A little help with bathing/dressing/bathroom;Assistance with cooking/housework;Assist for transportation;Help with stairs or ramp for entrance    Equipment Recommendations None recommended by PT  Recommendations for Other Services       Functional Status Assessment Patient has had a recent decline in their functional status and demonstrates the ability to make significant improvements in function in a reasonable and predictable amount of time.     Precautions / Restrictions  Precautions Precautions: Back;Fall Precaution Booklet Issued: Yes (comment) Precaution Comments: watch BP Restrictions Weight Bearing Restrictions: No      Mobility  Bed Mobility Overal bed mobility: Needs Assistance Bed Mobility: Rolling, Sidelying to Sit Rolling: Supervision Sidelying to sit: Min guard       General bed mobility comments: cues for rolling prior to sitting up.    Transfers Overall transfer level: Needs assistance Equipment used: Rolling Dustine Stickler (2 wheels) Transfers: Sit to/from Stand Sit to Stand: Min guard           General transfer comment: cues for hand placement but poor follow through    Ambulation/Gait Ambulation/Gait assistance: Min guard Gait Distance (Feet): 70 Feet Assistive device: Rolling Melany Wiesman (2 wheels) Gait Pattern/deviations: Step-through pattern, Decreased stride length Gait velocity: decreased     General Gait Details: min guard for safety. Complaining of dizziness but improved from this AM  Stairs Stairs: Yes Stairs assistance: Min guard Stair Management: One rail Right, Step to pattern, Forwards Number of Stairs: 2 General stair comments: min guard for safety. Instructed on up with good and down with bad  Wheelchair Mobility    Modified Rankin (Stroke Patients Only)       Balance Overall balance assessment: Needs assistance Sitting-balance support: No upper extremity supported, Feet supported Sitting balance-Leahy Scale: Good     Standing balance support: Bilateral upper extremity supported, Reliant on assistive device for balance Standing balance-Leahy Scale: Poor                               Pertinent Vitals/Pain Pain Assessment Pain Assessment: Faces Faces Pain Scale: Hurts even more Pain Location: back - incisional Pain Descriptors / Indicators: Discomfort, Grimacing Pain Intervention(s): Monitored during session  Home Living Family/patient expects to be discharged to:: Private  residence Living Arrangements: Spouse/significant other Available Help at Discharge: Family Type of Home: House Home Access: Stairs to enter Entrance Stairs-Rails: Right Entrance Stairs-Number of Steps: 2   Home Layout: Laundry or work area in basement;Able to live on main level with bedroom/bathroom Home Equipment: Orient (4 wheels);Cane - single point      Prior Function Prior Level of Function : Independent/Modified Independent             Mobility Comments: no AD ADLs Comments: generally indep with BADLs, husband drives and completes IADLs     Hand Dominance   Dominant Hand: Right    Extremity/Trunk Assessment   Upper Extremity Assessment Upper Extremity Assessment: Defer to OT evaluation    Lower Extremity Assessment Lower Extremity Assessment: Generalized weakness    Cervical / Trunk Assessment Cervical / Trunk Assessment: Back Surgery  Communication   Communication: No difficulties  Cognition Arousal/Alertness: Awake/alert Behavior During Therapy: WFL for tasks assessed/performed Overall Cognitive Status: Within Functional Limits for tasks assessed                                          General Comments General comments (skin integrity, edema, etc.): VSS on RA, no family present. BP soft but stable, RN aware    Exercises     Assessment/Plan    PT Assessment Patient needs continued PT services  PT Problem List Decreased strength;Decreased activity tolerance;Decreased mobility;Decreased balance;Decreased knowledge of precautions;Decreased safety awareness       PT Treatment Interventions DME instruction;Gait training;Functional mobility training;Therapeutic activities;Therapeutic exercise;Stair training;Balance training;Patient/family education    PT Goals (Current goals can be found in the Care Plan section)  Acute Rehab PT Goals Patient Stated Goal: to go home PT Goal Formulation: With patient Time For Goal Achievement:  05/15/22 Potential to Achieve Goals: Good    Frequency Min 5X/week     Co-evaluation               AM-PAC PT "6 Clicks" Mobility  Outcome Measure Help needed turning from your back to your side while in a flat bed without using bedrails?: A Little Help needed moving from lying on your back to sitting on the side of a flat bed without using bedrails?: A Little Help needed moving to and from a bed to a chair (including a wheelchair)?: A Little Help needed standing up from a chair using your arms (e.g., wheelchair or bedside chair)?: A Little Help needed to walk in hospital room?: A Little Help needed climbing 3-5 steps with a railing? : A Little 6 Click Score: 18    End of Session Equipment Utilized During Treatment: Gait belt Activity Tolerance: Patient tolerated treatment well Patient left: in bed;with call bell/phone within reach;with nursing/sitter in room (NT present taking vitals) Nurse Communication: Mobility status PT Visit Diagnosis: Unsteadiness on feet (R26.81);Muscle weakness (generalized) (M62.81);Other symptoms and signs involving the nervous system (R29.898)    Time: 1610-9604 PT Time Calculation (min) (ACUTE ONLY): 14 min   Charges:   PT Evaluation $PT Eval Low Complexity: 1 Low          Dorisann Schwanke A. Gilford Rile PT, DPT Acute Rehabilitation Services Office [phone]   Linna Hoff 05/01/2022, 9:56 AM

## 2022-05-01 NOTE — Care Management Obs Status (Signed)
[facility] NOTIFICATION   Patient Details  Name: Jacqueline Thornton MRN: [id number] Date of Birth: [birth year]   Medicare Observation Status Notification Given:  Yes    Pollie Friar, RN 05/01/2022, 8:13 AM

## 2022-05-02 ENCOUNTER — Encounter (HOSPITAL_COMMUNITY): Payer: Self-pay | Admitting: Neurological Surgery

## 2022-05-07 NOTE — Op Note (Signed)
Providing Compassionate, Quality Care - Together  Date of service: 04/30/2022  PREOP DIAGNOSIS:  L4-5 degenerative spondylolisthesis, mobile, with severe stenosis of bilateral radiculopathy   POSTOP DIAGNOSIS: Same   PROCEDURE: Minimally invasive L 4-5 bilateral decompression and transforaminal lumbar interbody fusion and arthrodesis, right sided approach Bilateral L4, L5 nonsegmental pedicle screw instrumentation, K2 M Everest 7.5 x 50 mm bilateral L4, 7.5 x 50 mm bilaterally at L5 Placement of anterior biomechanical device, K2 M titanium [place] 10 x 22 interbody device Intraoperative use of autograft, same incision Intraoperative use of allograft  Intraoperative use of proteos Intraoperative use of fluoroscopy, greater than 1 hour Intraoperative use of microscope, for microdissection   SURGEON: Dr. Pieter Partridge C. Gerrell Tabet, DO   ASSISTANT: Dr. Ashok Pall, MD; Weston Brass, NP   ANESTHESIA: General Endotracheal   EBL: 100 cc   SPECIMENS: None   DRAINS: None   COMPLICATIONS: None   CONDITION: Hemodynamically stable   HISTORY: Jacqueline Thornton is a 71 y.o. y.o. female who initially presented to the outpatient clinic with signs and symptoms consistent with right greater than left L5 radiculopathy and worsening back pain. MRI demonstrated severe stenosis at L4-5 with grade 1 anterior listhesis.  Flexion-extension imaging showed mobile spondylolisthesis, worse in flexion.  She did have a history of an L5-S1 ALIF which appeared appropriately fused.she underwent multiple conservative measures without significant relief of her pain.  Treatment options were discussed including continued epidural steroid injections, pain control or surgical intervention in the form of an L4-5 right transforaminal lumbar interbody fusion and decompression. All risks, benefits and expected outcomes were discussed agreed upon.  Informed consent was obtained.   PROCEDURE IN DETAIL: The patient was brought to  the operating room. After induction of general anesthesia, the patient was positioned on the operative table in the prone position on the [place] table. All pressure points were meticulously padded. Skin incision was then marked out and prepped and draped in the usual sterile fashion. Physician driven timeout was performed.   Using biplanar fluoroscopy, the C arm for sterilely draped and brought into the field and the paramedian incisions were planned over the L4-L5 interspace.  Local anesthetic was injected bilaterally.  Using a 10 blade, paramedian incision was created bilaterally.  Using Jamshidi, the bilateral L4 pedicles were accessed under biplanar fluoroscopy.  K wires were placed and the Jamshidi's were removed.  This was repeated bilaterally at L5.  Attention was then turned to docking the Metrix dilator.  Using with a series of dilators, on the patient's right side the facet lamina junction was docked with a 26 x 6 mm tube.  This was confirmed to be in the appropriate trajectory under lateral fluoroscopy.  At this point the microscope was sterilely draped and brought into the field.   Using Bovie electrocautery, soft tissue was cleared from the lamina and facet at L4-5.  Using a high-speed drill and collecting the autograft, a laminectomy and complete facetectomy was performed down to the ligamentum flavum.  This autograft was saved for later.  The ligamentum flavum was identified to its attachment along the superior lamina and lateral pars.  The decompression was carried further contralaterally with a high-speed drill to the contralateral lateral recess.  The contralateral lateral recess ligamentum flavum was identified.  Using a series of micro curettes, the ligamentum flavum was gently elevated and the epidural space was identified.  The ligamentum flavum was completely resected using Kerrison rongeurs.  The traversing and exiting nerve roots were identified.  Using Kerrison rongeurs, the common  dural tube and traversing and exiting nerve roots were completely decompressed from the ligamentum flavum.  This was carried across midline to the contralateral lateral recess to decompress the contralateral L5 nerve root.  They appeared pulsatile and noncompressed.  Camden's triangle was identified, the traversing nerve root was gently retracted medially, and the epidural space was coagulated and cleared of epidural veins.  The disc space was identified.  The disc annulus was then coagulated and incised with an 11 blade.  Using Kerrison rongeurs the annulotomy was widened.  Using a series of disc prep shavers and curettes a radical discectomy was performed to subchondral bleeding bone at both endplates.  The disc base was then trialed and found to be in 10 mm interbody size.  A mixture of autograft and allograft was then placed anteriorly and medially and packed with a bone tamp.  Using a nerve root retractor, the traversing nerve root was gently protected during placement of the interbody.   Using lateral fluoroscopy, the interbody was then placed under live fluoroscopy.  Appropriate placement was identified.  The remainder of the autograft and allograft was then placed laterally and the intervertebral space to the interbody device.  Hemostasis was achieved with passive hemostatics and bipolar cautery.  The traversing nerve root, neural tube and exiting nerve root were followed with a ball-tipped probe and noted to be noncompressed, pulsatile and in their normal position.  A few pieces of Gelfoam with thrombin were placed over the thecal sac.  The Metrix dilator tube was gently removed and hemostasis was achieved with bipolar cautery and the soft tissues.   The above listed pedicle screws were then placed under lateral fluoroscopy over the K wires bilaterally at L4 and L5.  Appropriate sized rods were measured, contoured and placed percutaneously.  These were confirmed to be within the t[place]ips, setscrews were  placed and final tightened to the manufacturer's recommendations.  Percutaneous towers were removed from the screws.  Final AP and lateral fluoroscopy images confirmed appropriate placement of all hardware.  Hemostasis was achieved with bipolar cautery.  The wound was closed in layers, 2-0 and 3-0 Vicryl sutures for the dermis.  Skin was closed with skin glue.  Sterile dressing was applied.   At the end of the case all sponge, needle, and instrument counts were correct. The patient was then transferred to the stretcher, extubated, and taken to the post-anesthesia care unit in stable hemodynamic condition.

## 2022-07-07 NOTE — Progress Notes (Unsigned)
PROVIDER NOTE: Information contained herein reflects review and annotations entered in association with encounter. Interpretation of such information and data should be left to medically-trained personnel. Information provided to patient can be located elsewhere in the medical record under "Patient Instructions". Document created using STT-dictation technology, any transcriptional errors that may result from process are unintentional.    Patient: Jacqueline Thornton  Service Category: E/M  Provider: Gaspar Cola, MD  DOB: Dec 19, 1950  DOS: 07/08/2022  Referring Provider: Perrin Maltese, MD  MRN: [id number]  Specialty: Interventional Pain Management  PCP: Perrin Maltese, MD  Type: Established Patient  Setting: Ambulatory outpatient    Location: Office  Delivery: Face-to-face     HPI  [name], a 72 y.o. year old female, is here today because of her No primary diagnosis found.. Ms. Zettler's primary complain today is No chief complaint on file. Last encounter: My last encounter with her was on 04/03/2022. Pertinent problems: Ms. Everman has Chronic pain syndrome; Headache, migraine; Chronic low back pain (Bilateral) (R>L) w/o sciatica; Spondylosis of lumbar spine; Lumbar annular disc tear (L4-5); Discogenic low back pain (L3-4 and L4-5); Lumbar facet hypertrophy; Lumbar facet syndrome (Bilateral) (R>L); Chronic neck pain (Left); Cervical spondylosis; Hx of cervical spine surgery; Cervical spinal fusion (C6-7 interbody fusion); Chronic sacroiliac joint pain (Bilateral) (L>R); Chronic lumbar radicular pain (Left); Trochanteric bursitis of hip (Right); Chronic hip pain (Bilateral) (L>R); Neurogenic pain; Myofascial pain; Osteoarthritis of hip (Left); Arthrodesis status; DDD (degenerative disc disease), lumbar; Myalgia; Trochanteric bursitis of hip (Bilateral) (L>R); Spondylosis without myelopathy or radiculopathy, lumbosacral region; Spondylosis without myelopathy or radiculopathy, sacral and  sacrococcygeal region; Other specified dorsopathies, sacral and sacrococcygeal region; Age-related osteoporosis without current pathological fracture; Bone island of right femur; Inflammatory spondylopathy of lumbar region [facility]); Trigger point with back pain (Right); Fibromyalgia; DDD (degenerative disc disease), cervical; Spondylolisthesis of lumbar region; History of lumbar spinal fusion (Dr. Erline Levine) (12/11/2018); Failed back surgical syndrome; Unilateral occipital headache (Left); Cervicogenic headache (Left); Chronic hip pain (Left); Spondylolisthesis at L5-S1 level; Spondylosis of lumbosacral region without myelopathy or radiculopathy; Epidural fibrosis; Enthesopathy of hip region (Left); Chronic sacroiliac joint pain (Left); Enthesopathy of sacroiliac joint (Left); Somatic dysfunction of sacroiliac joint (Left); Herniated cervical disc without myelopathy; Chronic sacroiliac joint pain (Right); Osteoarthritis of sacroiliac joint (Right) ([place]); Somatic dysfunction of sacroiliac joint (Right); Chronic hip pain (Right); Neck pain; Herniated nucleus pulposus, C3-4; Prolapsed cervical intervertebral disc; Cervical spinal stenosis; Radiculopathy, cervical region; Lumbar central spinal stenosis, 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 Pain Assessment: Severity of   is reported as a  /10. Location:    / . Onset:  . Quality:  . Timing:  . Modifying factor(s):  Marland Kitchen Vitals:  vitals were not taken for this visit.  BMI: Estimated body mass index is 27.73 kg/m as calculated from the following:   Height as of 04/30/22: 5' (1.524 m).   Weight as of 04/30/22: 142 lb (64.4 kg).  Reason for encounter: medication management. ***  Pharmacotherapy  Assessment  Analgesic: Oxycodone/APAP 7.5/325, 1 tab PO q 5 times a day. (56.25 mg/day of oxycodone) MME/day: 56.25 mg/day.   Monitoring: [place] PMP: PDMP reviewed during this encounter.       Pharmacotherapy: No side-effects or adverse reactions reported. Compliance: No problems identified. Effectiveness:  Clinically acceptable.  No notes on file  No results found for: "CBDTHCR" No results found for: "D8THCCBX" No results found for: "D9THCCBX"  UDS:  Summary  Date Value Ref Range Status  12/19/2021 Note  Final    Comment:    ==================================================================== [employer] [facility] (MW) ==================================================================== Test                             Result       Flag       Units  Drug Present and Declared for Prescription Verification   Oxycodone                      >8772        EXPECTED   ng/mg creat   Oxymorphone                    1343         EXPECTED   ng/mg creat   Noroxycodone                   >8772        EXPECTED   ng/mg creat   Noroxymorphone                 446          EXPECTED   ng/mg creat    Sources of oxycodone are scheduled prescription medications.    Oxymorphone, noroxycodone, and noroxymorphone are expected    metabolites of oxycodone. Oxymorphone is also available as a    scheduled prescription medication.  ==================================================================== Test                      Result    Flag   Units      Ref Range   Creatinine              114              mg/dL      >=20 ==================================================================== Declared Medications:  The flagging and interpretation on this report are based on the  following declared medications.  Unexpected results may arise from  inaccuracies in the declared medications.   **Note: The testing scope of this panel includes these medications:   Oxycodone (Percocet)   **Note: The testing scope of this  panel does not include the  following reported medications:   Acetaminophen (Percocet)  Atorvastatin  Calcium  Cyclobenzaprine (Flexeril)  Duloxetine (Cymbalta)  Enalapril (Vasotec)  Estradiol (Estrace)  Gabapentin (Neurontin)  Ibandronate (Boniva)  Melatonin  Multivitamin  Omeprazole  Spironolactone  Vitamin B12 ==================================================================== For clinical consultation, please call [phone]. ====================================================================       ROS  Constitutional: Denies any fever or chills Gastrointestinal: No reported hemesis, hematochezia, vomiting, or acute GI distress Musculoskeletal: Denies any acute onset joint swelling, redness, loss of ROM, or weakness Neurological: No reported episodes of acute onset apraxia, aphasia, dysarthria, agnosia, amnesia, paralysis, loss of coordination, or loss of consciousness  Medication Review  Calcium-Vitamin D-Vitamin K, Cyanocobalamin, DULoxetine, Iron, Melatonin, atorvastatin, enalapril, estradiol, furosemide, gabapentin, methocarbamol, multivitamin with minerals, naloxone, omeprazole, oxyCODONE-acetaminophen, and spironolactone  History Review  Allergy: Ms. Zweig has No Known Allergies. Drug: Ms. Erck  reports no history of drug use. Alcohol:  reports no history of alcohol use. Tobacco:  reports that she quit smoking about 19 years ago. Her smoking use included cigarettes. She has never used smokeless  tobacco. Social: Ms. Lauderbaugh  reports that she quit smoking about 19 years ago. Her smoking use included cigarettes. She has never used smokeless tobacco. She reports that she does not drink alcohol and does not use drugs. Medical:  has a past medical history of Absolute anemia (04/11/2015), Acute postoperative pain (08/07/2017), Angina pectoris ([place]), Anxiety, Arthritis, Arthropathy of sacroiliac joint (04/11/2015), Atypical face pain (04/24/2015), Back pain, CAD  (coronary artery disease), Cancer ([place]), Chronic back pain, Constipation, Depression, Fibromyalgia, GERD (gastroesophageal reflux disease), 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) (04/11/2015), H/O: hysterectomy (1979), Heart murmur, Hyperlipidemia, Hypertension, Low back pain (04/06/2015), Lumbar radicular pain (04/18/2015), Migraine, Narrowing of intervertebral disc space (04/11/2015), Sacroiliac joint pain (04/11/2015), and Spine disorder. Surgical: Ms. Idrovo  has a past surgical history that includes Cholecystectomy; Abdominal hysterectomy; Neck surgery; Colonoscopy with propofol (N/A, 07/19/2015); caract surger; Laparoscopic salpingo oophorectomy (Bilateral, 03/03/2018); Cystoscopy (03/03/2018); Tonsillectomy; Appendectomy; Tumor removal; Anterior lumbar fusion (N/A, 12/11/2018); Abdominal exposure (N/A, 12/11/2018); Anterior cervical decomp/discectomy fusion (N/A, 10/06/2020); Toe Fusion (Right); Esophagogastroduodenoscopy (egd) with propofol (N/A, 09/11/2021); Colonoscopy with propofol (N/A, 09/11/2021); Colonoscopy with propofol (N/A, 03/05/2022); and Transforaminal lumbar interbody fusion w/ mis 1 level (Right, 04/30/2022). Family: family history includes Colon cancer in her maternal grandmother and mother; Diabetes in her father and mother; Hypertension in her sister; Stroke in her mother.  Laboratory Chemistry Profile   Renal Lab Results  Component Value Date   BUN 9 04/24/2022   CREATININE 0.95 04/24/2022   BCR 8 (L) 12/16/2019   GFRAA 47 (L) 12/16/2019   GFRNONAA >60 04/24/2022    Hepatic Lab Results  Component Value Date   AST 15 12/16/2019   ALT 14 03/08/2018   ALBUMIN 4.0 12/16/2019   ALKPHOS 60 12/16/2019    Electrolytes Lab Results  Component Value Date   NA 136 04/24/2022   K 3.6 04/24/2022   CL 101 04/24/2022   CALCIUM 8.7 (L) 04/24/2022   MG 2.4 (H) 12/16/2019    Bone Lab Results  Component Value Date   25OHVITD1 53 12/16/2019   25OHVITD2 <1.0 12/16/2019    25OHVITD3 53 12/16/2019    Inflammation (CRP: Acute Phase) (ESR: Chronic Phase) Lab Results  Component Value Date   CRP 4 12/16/2019   ESRSEDRATE 6 12/16/2019         Note: Above Lab results reviewed.  Recent Imaging Review  DG Lumbar Spine 2-3 Views CLINICAL DATA:  L4-5 decompression and transforaminal lumbar interbody fusion.  EXAM: LUMBAR SPINE - 2-3 VIEW  COMPARISON:  Lumbar spine MRI dated 12/06/2021 and radiographs dated 01/25/2022  FINDINGS: PA and lateral C-arm images of the lumbosacral spine demonstrate interval interbody and pedicle screw and rod fusion at the L4-5 level with continued mild anterolisthesis at that level. Stable interbody hardware and screw fusion at the L5-S1 level. Stable multilevel anterior spur formation.  IMPRESSION: Operative changes, as described above.  Electronically Signed   By: Claudie Revering M.D.   On: 04/30/2022 13:00 DG C-Arm 1-60 Min-No Report Fluoroscopy was utilized by the requesting physician.  No radiographic  interpretation.  DG C-Arm 1-60 Min-No Report Fluoroscopy was utilized by the requesting physician.  No radiographic  interpretation.  DG C-Arm 1-60 Min-No Report Fluoroscopy was utilized by the requesting physician.  No radiographic  interpretation.  Note: Reviewed        Physical Exam  General appearance: Well nourished, well developed, and well hydrated. In no apparent acute distress Mental status: Alert, oriented x 3 (person, place, & time)  Respiratory: No evidence of acute respiratory distress Eyes: PERLA Vitals: There were no vitals taken for this visit. BMI: Estimated body mass index is 27.73 kg/m as calculated from the following:   Height as of 04/30/22: 5' (1.524 m).   Weight as of 04/30/22: 142 lb (64.4 kg). Ideal: Patient weight not recorded  Assessment   Diagnosis Status  No diagnosis found. Controlled Controlled Controlled   Updated Problems: No problems updated.  Plan of Care   Problem-specific:  No problem-specific Assessment & Plan notes found for this encounter.  Ms. NEENA BEECHAM has a current medication list which includes the following long-term medication(s): atorvastatin, viactiv calcium plus d, cyanocobalamin, duloxetine, enalapril, iron, furosemide, omeprazole, oxycodone-acetaminophen, oxycodone-acetaminophen, oxycodone-acetaminophen, oxycodone-acetaminophen, and spironolactone.  Pharmacotherapy (Medications Ordered): No orders of the defined types were placed in this encounter.  Orders:  No orders of the defined types were placed in this encounter.  Follow-up plan:   No follow-ups on file.     Interventional Therapies  Risk  Complexity Considerations:   Estimated body mass index is 28.32 kg/m as calculated from the following:   Height as of this encounter: 5' (1.524 m).   Weight as of this encounter: 145 lb (65.8 kg). WNL   Planned  Pending:   Diagnostic/therapeutic midline caudal ESI #2 + sacral interspinous ligament MNB/TPI #1    Under consideration:   Diagnostic caudal diagnostic epidurogram Diagnostic CESI  Diagnostic bilateral cervical facet block  Possible bilateral cervical facet RFA  Diagnostic left L4-5 LESI  Diagnostic left S1 SNRB    Completed:   Diagnostic bilateral L4 TFESI x1 (01/08/2022) (100/25/0)  Therapeutic right Racz procedure x1 (08/17/2020) (04/09/84/85) (100% relief of the right LEP) Diagnostic caudal ESI x1 (07/11/2020) (100/100/0/0)  Palliative left L5-S1 LESI x2 (07/27/2015) (70/70/0/0)  Diagnostic left lumbar facet MBB x7 (02/07/2022) (20/20/0/0)  Palliative right lumbar facet MBB x10 (02/07/2022) (20/20/0/0)  Therapeutic right shoulder joint inj. x1 (02/29/2016) (90/80/30)  Therapeutic left IA hip injection x3 (06/13/2020) (100/100/30/>50)  Diagnostic right SI joint Blk x3 (06/17/2017) (100/90/80/80)  Diagnostic/therapeutic left SI joint Blk x4 (06/17/2017) (100/90/80/80)  Palliative bilateral trochanteric  bursa inj. x1 (12/11/2016) (100/100/90/90)  Therapeutic right lumbar facet RFA x2 (01/11/2021) (100/100/70)  Therapeutic left lumbar facet RFA x2 (12/19/2020) (100/100/75/>75) Therapeutic right SI joint RFA x1 (08/07/2017) (100/100/80)  Therapeutic left SI joint RFA x1 (09/16/2017) (100/100/100)    Therapeutic  Palliative (PRN) options:   Palliative Racz procedure  Palliative bilateral SI joint block  Palliative right trochanteric bursa injection      Recent Visits No visits were found meeting these conditions. Showing recent visits within past 90 days and meeting all other requirements Future Appointments Date Type Provider Dept  07/08/22 Appointment Milinda Pointer, MD [facility]  Showing future appointments within next 90 days and meeting all other requirements  I discussed the assessment and treatment plan with the patient. The patient was provided an opportunity to ask questions and all were answered. The patient agreed with the plan and demonstrated an understanding of the instructions.  Patient advised to call back or seek an in-person evaluation if the symptoms or condition worsens.  Duration of encounter: *** minutes.  Total time on encounter, as per AMA guidelines included both the face-to-face and non-face-to-face time personally spent by the physician and/or other qualified health care professional(s) on the day of the encounter (includes time in activities that require the physician or other qualified health care professional and does not include time in activities normally performed by  clinical staff). Physician's time may include the following activities when performed: Preparing to see the patient (e.g., pre-charting review of records, searching for previously ordered imaging, lab work, and nerve conduction tests) Review of prior analgesic pharmacotherapies. Reviewing PMP Interpreting ordered tests (e.g., lab work, imaging, nerve conduction tests) Performing  post-procedure evaluations, including interpretation of diagnostic procedures Obtaining and/or reviewing separately obtained history Performing a medically appropriate examination and/or evaluation Counseling and educating the patient/family/caregiver Ordering medications, tests, or procedures Referring and communicating with other health care professionals (when not separately reported) Documenting clinical information in the electronic or other health record Independently interpreting results (not separately reported) and communicating results to the patient/ family/caregiver Care coordination (not separately reported)  Note by: Gaspar Cola, MD Date: 07/08/2022; Time: 5:59 PM

## 2022-07-08 ENCOUNTER — Ambulatory Visit: Payer: Medicare Other | Attending: Pain Medicine | Admitting: Pain Medicine

## 2022-07-08 ENCOUNTER — Encounter: Payer: Self-pay | Admitting: Pain Medicine

## 2022-07-08 VITALS — BP 131/63 | HR 76 | Temp 97.0°F | Ht 62.0 in | Wt 142.0 lb

## 2022-07-08 DIAGNOSIS — M961 Postlaminectomy syndrome, not elsewhere classified: Secondary | ICD-10-CM | POA: Diagnosis present

## 2022-07-08 DIAGNOSIS — Z79891 Long term (current) use of opiate analgesic: Secondary | ICD-10-CM | POA: Diagnosis present

## 2022-07-08 DIAGNOSIS — Z79899 Other long term (current) drug therapy: Secondary | ICD-10-CM | POA: Diagnosis present

## 2022-07-08 DIAGNOSIS — M5136 Other intervertebral disc degeneration, lumbar region: Secondary | ICD-10-CM | POA: Diagnosis not present

## 2022-07-08 DIAGNOSIS — M5442 Lumbago with sciatica, left side: Secondary | ICD-10-CM | POA: Insufficient documentation

## 2022-07-08 DIAGNOSIS — M47816 Spondylosis without myelopathy or radiculopathy, lumbar region: Secondary | ICD-10-CM | POA: Insufficient documentation

## 2022-07-08 DIAGNOSIS — G894 Chronic pain syndrome: Secondary | ICD-10-CM

## 2022-07-08 DIAGNOSIS — M25551 Pain in right hip: Secondary | ICD-10-CM

## 2022-07-08 DIAGNOSIS — G8929 Other chronic pain: Secondary | ICD-10-CM | POA: Diagnosis present

## 2022-07-08 DIAGNOSIS — M542 Cervicalgia: Secondary | ICD-10-CM | POA: Insufficient documentation

## 2022-07-08 DIAGNOSIS — M25552 Pain in left hip: Secondary | ICD-10-CM | POA: Diagnosis present

## 2022-07-08 MED ORDER — OXYCODONE-ACETAMINOPHEN 7.5-325 MG PO TABS: 1.0000 | ORAL_TABLET | Freq: Every day | ORAL | 0 refills | Status: DC

## 2022-07-08 NOTE — Patient Instructions (Signed)
____________________________________________________________________________________________  Patient Information update  To: All of our patients.  Re: Name change.  It has been made official that our current name, "[facility]"   will soon be changed to "[facility]".   The purpose of this change is to eliminate any confusion created by the concept of our practice being a "Medication Management Pain Clinic". In the past this has led to the misconception that we treat pain primarily by the use of prescription medications.  Nothing can be farther from the truth.   Understanding PAIN MANAGEMENT: To further understand what our practice does, you first have to understand that "Pain Management" is a subspecialty that requires additional training once a physician has completed their specialty training, which can be in either Anesthesia, Neurology, Psychiatry, or Physical Medicine and Rehabilitation (PMR). Each one of these contributes to the final approach taken by each physician to the management of their patient's pain. To be a "Pain Management Specialist" you must have first completed one of the specialty trainings below.  Anesthesiologists - trained in clinical pharmacology and interventional techniques such as nerve blockade and regional as well as central neuroanatomy. They are trained to block pain before, during, and after surgical interventions.  Neurologists - trained in the diagnosis and pharmacological treatment of complex neurological conditions, such as Multiple Sclerosis, Parkinson's, spinal cord injuries, and other systemic conditions that may be associated with symptoms that may include but are not limited to pain. They tend to rely primarily on the treatment of chronic pain using prescription medications.  Psychiatrist - trained in conditions affecting the psychosocial  wellbeing of patients including but not limited to depression, anxiety, schizophrenia, personality disorders, addiction, and other substance use disorders that may be associated with chronic pain. They tend to rely primarily on the treatment of chronic pain using prescription medications.   Physical Medicine and Rehabilitation (PMR) physicians, also known as physiatrists - trained to treat a wide variety of medical conditions affecting the brain, spinal cord, nerves, bones, joints, ligaments, muscles, and tendons. Their training is primarily aimed at treating patients that have suffered injuries that have caused severe physical impairment. Their training is primarily aimed at the physical therapy and rehabilitation of those patients. They may also work alongside orthopedic surgeons or neurosurgeons using their expertise in assisting surgical patients to recover after their surgeries.  INTERVENTIONAL PAIN MANAGEMENT is sub-subspecialty of Pain Management.  Our physicians are Board-certified in Anesthesia, Pain Management, and Interventional Pain Management.  This meaning that not only have they been trained and Board-certified in their specialty of Anesthesia, and subspecialty of Pain Management, but they have also received further training in the sub-subspecialty of Interventional Pain Management, in order to become Board-certified as INTERVENTIONAL PAIN MANAGEMENT SPECIALIST.    Mission: Our goal is to use our skills in  INTERVENTIONAL PAIN MANAGEMENT as alternatives to the chronic use of prescription opioid medications for the treatment of pain. To make this more clear, we have changed our name to reflect what we do and offer. We will continue to offer medication management assessment and recommendations, but we will not be taking over any patient's medication management.  ____________________________________________________________________________________________      _______________________________________________________________________  Medication Rules  Purpose: To inform patients, and their family members, of our medication rules and regulations.  Applies to: All patients receiving prescriptions from our practice (written or electronic).  Pharmacy of record: This is the pharmacy where your electronic prescriptions   will be sent. Make sure we have the correct one.  Electronic prescriptions: In compliance with the [place] Strengthen Opioid Misuse Prevention (STOP) Act of 2017 (Session Law 2017-74/H243), effective July 01, 2018, all controlled substances must be electronically prescribed. Written prescriptions, faxing, or calling prescriptions to a pharmacy will no longer be done.  Prescription refills: These will be provided only during in-person appointments. No medications will be renewed without a "face-to-face" evaluation with your provider. Applies to all prescriptions.  NOTE: The following applies primarily to controlled substances (Opioid* Pain Medications).   Type of encounter (visit): For patients receiving controlled substances, face-to-face visits are required. (Not an option and not up to the patient.)  Patient's responsibilities: Pain Pills: Bring all pain pills to every appointment (except for procedure appointments). Pill Bottles: Bring pills in original pharmacy bottle. Bring bottle, even if empty. Always bring the bottle of the most recent fill.  Medication refills: You are responsible for knowing and keeping track of what medications you are taking and when is it that you will need a refill. The day before your appointment: write a list of all prescriptions that need to be refilled. The day of the appointment: give the list to the admitting nurse. Prescriptions will be written only during appointments. No prescriptions will be written on procedure days. If you forget a medication: it will not be "Called in", "Faxed", or  "electronically sent". You will need to get another appointment to get these prescribed. No early refills. Do not call asking to have your prescription filled early. Partial  or short prescriptions: Occasionally your pharmacy may not have enough pills to fill your prescription.  NEVER ACCEPT a partial fill or a prescription that is short of the total amount of pills that you were prescribed.  With controlled substances the law allows 72 hours for the pharmacy to complete the prescription.  If the prescription is not completed within 72 hours, the pharmacist will require a new prescription to be written. This means that you will be short on your medicine and we WILL NOT send another prescription to complete your original prescription.  Instead, request the pharmacy to send a carrier to a nearby branch to get enough medication to provide you with your full prescription. Prescription Accuracy: You are responsible for carefully inspecting your prescriptions before leaving our office. Have the discharge nurse carefully go over each prescription with you, before taking them home. Make sure that your name is accurately spelled, that your address is correct. Check the name and dose of your medication to make sure it is accurate. Check the number of pills, and the written instructions to make sure they are clear and accurate. Make sure that you are given enough medication to last until your next medication refill appointment. Taking Medication: Take medication as prescribed. When it comes to controlled substances, taking less pills or less frequently than prescribed is permitted and encouraged. Never take more pills than instructed. Never take the medication more frequently than prescribed.  Inform other Doctors: Always inform, all of your healthcare providers, of all the medications you take. Pain Medication from other Providers: You are not allowed to accept any additional pain medication from any other Doctor or  Healthcare provider. There are two exceptions to this rule. (see below) In the event that you require additional pain medication, you are responsible for notifying us, as stated below. Cough Medicine: Often these contain an opioid, such as codeine or hydrocodone. Never accept or take cough medicine containing   these opioids if you are already taking an opioid* medication. The combination may cause respiratory failure and death. Medication Agreement: You are responsible for carefully reading and following our Medication Agreement. This must be signed before receiving any prescriptions from our practice. Safely store a copy of your signed Agreement. Violations to the Agreement will result in no further prescriptions. (Additional copies of our Medication Agreement are available upon request.) Laws, Rules, & Regulations: All patients are expected to follow all Federal and State Laws, Statutes, Rules, & Regulations. Ignorance of the Laws does not constitute a valid excuse.  Illegal drugs and Controlled Substances: The use of illegal substances (including, but not limited to marijuana and its derivatives) and/or the illegal use of any controlled substances is strictly prohibited. Violation of this rule may result in the immediate and permanent discontinuation of any and all prescriptions being written by our practice. The use of any illegal substances is prohibited. Adopted CDC guidelines & recommendations: Target dosing levels will be at or below 60 MME/day. Use of benzodiazepines** is not recommended.  Exceptions: There are only two exceptions to the rule of not receiving pain medications from other Healthcare Providers. Exception #1 (Emergencies): In the event of an emergency (i.e.: accident requiring emergency care), you are allowed to receive additional pain medication. However, you are responsible for: As soon as you are able, call our office [phone], at any time of the day or night, and leave a  message stating your name, the date and nature of the emergency, and the name and dose of the medication prescribed. In the event that your call is answered by a member of our staff, make sure to document and save the date, time, and the name of the person that took your information.  Exception #2 (Planned Surgery): In the event that you are scheduled by another doctor or dentist to have any type of surgery or procedure, you are allowed (for a period no longer than 30 days), to receive additional pain medication, for the acute post-op pain. However, in this case, you are responsible for picking up a copy of our "Post-op Pain Management for Surgeons" handout, and giving it to your surgeon or dentist. This document is available at our office, and does not require an appointment to obtain it. Simply go to our office during business hours (Monday-Thursday from 8:00 AM to 4:00 PM) (Friday 8:00 AM to 12:00 Noon) or if you have a scheduled appointment with us, prior to your surgery, and ask for it by name. In addition, you are responsible for: calling our office [phone], at any time of the day or night, and leaving a message stating your name, name of your surgeon, type of surgery, and date of procedure or surgery. Failure to comply with your responsibilities may result in termination of therapy involving the controlled substances. Medication Agreement Violation. Following the above rules, including your responsibilities will help you in avoiding a Medication Agreement Violation ("Breaking your Pain Medication Contract").  Consequences:  Not following the above rules may result in permanent discontinuation of medication prescription therapy.  *Opioid medications include: morphine, codeine, oxycodone, oxymorphone, hydrocodone, hydromorphone, meperidine, tramadol, tapentadol, buprenorphine, fentanyl, methadone. **Benzodiazepine medications include: diazepam (Valium), alprazolam (Xanax), clonazepam (Klonopine),  lorazepam (Ativan), clorazepate (Tranxene), chlordiazepoxide (Librium), estazolam (Prosom), oxazepam (Serax), temazepam (Restoril), triazolam (Halcion) (Last updated: 04/23/2022) ______________________________________________________________________    ______________________________________________________________________  Medication Recommendations and Reminders  Applies to: All patients receiving prescriptions (written and/or electronic).  Medication Rules & Regulations: You are responsible   for reading, knowing, and following our "Medication Rules" document. These exist for your safety and that of others. They are not flexible and neither are we. Dismissing or ignoring them is an act of "non-compliance" that may result in complete and irreversible termination of such medication therapy. For safety reasons, "non-compliance" will not be tolerated. As with the [place] fundamental legal principle of "ignorance of the law is no defense", we will accept no excuses for not having read and knowing the content of documents provided to you by our practice.  Pharmacy of record:  Definition: This is the pharmacy where your electronic prescriptions will be sent.  We do not endorse any particular pharmacy. It is up to you and your insurance to decide what pharmacy to use.  We do not restrict you in your choice of pharmacy. However, once we write for your prescriptions, we will NOT be re-sending more prescriptions to fix restricted supply problems created by your pharmacy, or your insurance.  The pharmacy listed in the electronic medical record should be the one where you want electronic prescriptions to be sent. If you choose to change pharmacy, simply notify our nursing staff. Changes will be made only during your regular appointments and not over the phone.  Recommendations: Keep all of your pain medications in a safe place, under lock and key, even if you live alone. We will NOT replace lost, stolen, or  damaged medication. We do not accept "Police Reports" as proof of medications having been stolen. After you fill your prescription, take 1 week's worth of pills and put them away in a safe place. You should keep a separate, properly labeled bottle for this purpose. The remainder should be kept in the original bottle. Use this as your primary supply, until it runs out. Once it's gone, then you know that you have 1 week's worth of medicine, and it is time to come in for a prescription refill. If you do this correctly, it is unlikely that you will ever run out of medicine. To make sure that the above recommendation works, it is very important that you make sure your medication refill appointments are scheduled at least 1 week before you run out of medicine. To do this in an effective manner, make sure that you do not leave the office without scheduling your next medication management appointment. Always ask the nursing staff to show you in your prescription , when your medication will be running out. Then arrange for the receptionist to get you a return appointment, at least 7 days before you run out of medicine. Do not wait until you have 1 or 2 pills left, to come in. This is very poor planning and does not take into consideration that we may need to cancel appointments due to bad weather, sickness, or emergencies affecting our staff. DO NOT ACCEPT A "Partial Fill": If for any reason your pharmacy does not have enough pills/tablets to completely fill or refill your prescription, do not allow for a "partial fill". The law allows the pharmacy to complete that prescription within 72 hours, without requiring a new prescription. If they do not fill the rest of your prescription within those 72 hours, you will need a separate prescription to fill the remaining amount, which we will NOT provide. If the reason for the partial fill is your insurance, you will need to talk to the pharmacist about payment alternatives for  the remaining tablets, but again, DO NOT ACCEPT A PARTIAL FILL, unless you can trust   your pharmacist to obtain the remainder of the pills within 72 hours.  Prescription refills and/or changes in medication(s):  Prescription refills, and/or changes in dose or medication, will be conducted only during scheduled medication management appointments. (Applies to both, written and electronic prescriptions.) No refills on procedure days. No medication will be changed or started on procedure days. No changes, adjustments, and/or refills will be conducted on a procedure day. Doing so will interfere with the diagnostic portion of the procedure. No phone refills. No medications will be "called into the pharmacy". No Fax refills. No weekend refills. No Holliday refills. No after hours refills.  Remember:  Business hours are:  Monday to Thursday 8:00 AM to 4:00 PM Provider's Schedule: Milinda Pointer, MD - Appointments are:  Medication management: Monday and Wednesday 8:00 AM to 4:00 PM Procedure day: Tuesday and Thursday 7:30 AM to 4:00 PM Gillis Santa, MD - Appointments are:  Medication management: Tuesday and Thursday 8:00 AM to 4:00 PM Procedure day: Monday and Wednesday 7:30 AM to 4:00 PM (Last update: 04/23/2022) ______________________________________________________________________    ____________________________________________________________________________________________  National Pain Medication Shortage  The [place] is experiencing worsening drug shortages. These have had a negative widespread effect on patient care and treatment. Not expected to improve any time soon. Predicted to last past 2029.   Drug shortage list (generic names) Oxycodone IR Oxycodone/APAP Oxymorphone IR Hydromorphone Hydrocodone/APAP Morphine  Where is the problem?  Manufacturing and supply level.  Will this shortage affect you?  Only if you take any of the above pain medications.  How? You may be  unable to fill your prescription.  Your pharmacist may offer a "partial fill" of your prescription. (Warning: Do not accept partial fills.) Prescriptions partially filled cannot be transferred to another pharmacy. Read our Medication Rules and Regulation. Depending on how much medicine you are dependent on, you may experience withdrawals when unable to get the medication.  Recommendations: Consider ending your dependence on opioid pain medications. Ask your pain specialist to assist you with the process. Consider switching to a medication currently not in shortage, such as Buprenorphine. Talk to your pain specialist about this option. Consider decreasing your pain medication requirements by managing tolerance thru "Drug Holidays". This may help minimize withdrawals, should you run out of medicine. Control your pain thru the use of non-pharmacological interventional therapies.   Your prescriber: Prescribers cannot be blamed for shortages. Medication manufacturing and supply issues cannot be fixed by the prescriber.   NOTE: The prescriber is not responsible for supplying the medication, or solving supply issues. Work with your pharmacist to solve it. The patient is responsible for the decision to take or continue taking the medication and for identifying and securing a legal supply source. By law, supplying the medication is the job and responsibility of the pharmacy. The prescriber is responsible for the evaluation, monitoring, and prescribing of these medications.   Prescribers will NOT: Re-issue prescriptions that have been partially filled. Re-issue prescriptions already sent to a pharmacy.  Re-send prescriptions to a different pharmacy because yours did not have your medication. Ask pharmacist to order more medicine or transfer the prescription to another pharmacy. (Read below.)  New 2023 regulation: "March 01, 2022 Revised Regulation Allows DEA-Registered Pharmacies to Transfer  Electronic Prescriptions at a Patient's Request [place] Patients now have the ability to request their electronic prescription be transferred to another pharmacy without having to go back to their practitioner to initiate the request. This revised regulation went into  effect on Monday, February 25, 2022.     At a patient's request, a DEA-registered retail pharmacy can now transfer an electronic prescription for a controlled substance (schedules II-V) to another DEA-registered retail pharmacy. Prior to this change, patients would have to go through their practitioner to cancel their prescription and have it re-issued to a different pharmacy. The process was taxing and time consuming for both patients and practitioners.    The Drug Enforcement Administration [facility]) published its intent to revise the process for transferring electronic prescriptions on May 19, 2020.  The final rule was published in the federal register on January 24, 2022 and went into effect 30 days later.  Under the final rule, a prescription can only be transferred once between pharmacies, and only if allowed under existing state or other applicable law. The prescription must remain in its electronic form; may not be altered in any way; and the transfer must be communicated directly between two licensed pharmacists. It's important to note, any authorized refills transfer with the original prescription, which means the entire prescription will be filled at the same pharmacy".  Reference: [employer] [facility] website announcement)  [email or web address] ([place])   [employer] / Vol. 88, No. 143 / Thursday, January 24, 2022 / Rules and Regulations DEPARTMENT OF JUSTICE  Drug Enforcement Administration  21 CFR Part  1306  [Docket No. [id number]]  RIN [id number] Transfer of Electronic Prescriptions for Schedules II-V Controlled Substances Between Pharmacies for Initial Filling  ____________________________________________________________________________________________     ____________________________________________________________________________________________  Drug Holidays  What is a "Drug Holiday"? Drug Holiday: is the name given to the process of slowly tapering down and temporarily stopping the pain medication for the purpose of decreasing or eliminating tolerance to the drug.  Benefits Improved effectiveness Decreased required effective dose Improved pain control End dependence on high dose therapy Decrease cost of therapy Uncovering "opioid-induced hyperalgesia". (OIH)  What is "opioid hyperalgesia"? It is a paradoxical increase in pain caused by exposure to opioids. Stopping the opioid pain medication, contrary to the expected, it actually decreases or completely eliminates the pain. Ref.: "A comprehensive review of opioid-induced hyperalgesia". Brion Aliment, et.al. Pain Physician. 2011 Mar-Apr;14(2):145-61.  What is tolerance? Tolerance: the progressive loss of effectiveness of a pain medicine due to repetitive use. A common problem of opioid pain medications.  How long should a "Drug Holiday" last? Effectiveness depends on the patient staying off all opioid pain medicines for a minimum of 14 consecutive days. (2 weeks)  How about just taking less of the medicine? Does not work. Will not accomplish goal of eliminating the excess receptors.  How about switching to a different pain medicine? (AKA. "Opioid rotation") Does not work. Creates the illusion of effectiveness by taking advantage of inaccurate equivalent dose calculations between different opioids. -This "technique" was promoted by studies funded by [employer], such as [employer], creators of  "OxyContin".  Can I stop the medicine "cold [place]"? Depends. You should always coordinate with your Pain Specialist to make the transition as smoothly as possible. Avoid stopping the medicine abruptly without consulting. We recommend a "slow taper".  What is a slow taper? Taper: refers to the gradual decrease in dose.   How do I stop/taper the dose? Slowly. Decrease the daily amount of pills that you take by one (1) pill every seven (7) days. This is called a "slow downward taper". Example: if you normally take four (4) pills per day, drop it to three (3) pills per day  for seven (7) days, then to two (2) pills per day for seven (7) days, then to one (1) per day for seven (7) days, and then stop the medicine. The 14 day "Drug Holiday" starts on the first day without medicine.   Will I experience withdrawals? Unlikely with a slow taper.  What triggers withdrawals? Withdrawals are triggered by the sudden/abrupt stop of high dose opioids. Withdrawals can be avoided by slowly decreasing the dose over a prolonged period of time.  What are withdrawals? Symptoms associated with sudden/abrupt reduction/stopping of high-dose, long-term use of pain medication. Withdrawal are seldom seen on low dose therapy, or patients rarely taking opioid medication.  Early Withdrawal Symptoms may include: Agitation Anxiety Muscle aches Increased tearing Insomnia Runny nose Sweating Yawning  Late symptoms may include: Abdominal cramping Diarrhea Dilated pupils Goose bumps Nausea Vomiting  (Last update: 06/09/2022) ____________________________________________________________________________________________    ____________________________________________________________________________________________  WARNING: CBD (cannabidiol) & Delta (Delta-8 tetrahydrocannabinol) products.   Applicable to:  All individuals currently taking or considering taking CBD (cannabidiol) and, more important, all  patients taking opioid analgesic controlled substances (pain medication). (Example: oxycodone; oxymorphone; hydrocodone; hydromorphone; morphine; methadone; tramadol; tapentadol; fentanyl; buprenorphine; butorphanol; dextromethorphan; meperidine; codeine; etc.)  Introduction:  Recently there has been a drive towards the use of "natural" products for the treatment of different conditions, including pain anxiety and sleep disorders. Marijuana and hemp are two varieties of the cannabis genus plants. Marijuana and its derivatives are illegal, while hemp and its derivatives are not. Cannabidiol (CBD) and tetrahydrocannabinol (THC), are two natural compounds found in plants of the Cannabis genus. They can both be extracted from hemp or marijuana. Both compounds interact with your body's endocannabinoid system in very different ways. CBD is associated with pain relief (analgesia) while THC is associated with the psychoactive effects ("the high") obtained from the use of marijuana products. There are two main types of THC: Delta-9, which comes from the marijuana plant and it is illegal, and Delta-8, which comes from the hemp plant, and it is legal. (Both, Delta-9-THC and Delta-8-THC are psychoactive and give you "the high".)   Legality:  Marijuana and its derivatives: illegal Hemp and its derivatives: Legal (State dependent) UPDATE: (08/17/2021) The Drug Enforcement Agency (DEA) issued a letter stating that "delta" cannabinoids, including Delta-8-THCO and Delta-9-THCO, synthetically derived from hemp do not qualify as hemp and will be viewed as Schedule I drugs. (Schedule I drugs, substances, or chemicals are defined as drugs with no currently accepted medical use and a high potential for abuse. Some examples of Schedule I drugs are: heroin, lysergic acid diethylamide (LSD), marijuana (cannabis), 3,4-methylenedioxymethamphetamine (ecstasy), methaqualone, and peyote.) (https://www.dea.gov)  Legal status of CBD in  [place]:  "Conditionally Legal"  Reference: "FDA Regulation of Cannabis and Cannabis-Derived Products, Including Cannabidiol (CBD)" - https://www.fda.gov/news-events/public-health-focus/fda-regulation-cannabis-and-cannabis-derived-products-including-cannabidiol-cbd  Warning:  CBD is not FDA approved and has not undergo the same manufacturing controls as prescription drugs.  This means that the purity and safety of available CBD may be questionable. Most of the time, despite manufacturer's claims, it is contaminated with THC (delta-9-tetrahydrocannabinol - the chemical in marijuana responsible for the "HIGH").  When this is the case, the THC contaminant will trigger a positive urine drug screen (UDS) test for Marijuana (carboxy-THC).   The FDA recently put out a warning about 5 things that everyone should be aware of regarding Delta-8 THC: Delta-8 THC products have not been evaluated or approved by the FDA for safe use and may be marketed in ways that put the public health at   risk. The FDA has received adverse event reports involving delta-8 THC-containing products. Delta-8 THC has psychoactive and intoxicating effects. Delta-8 THC manufacturing often involve use of potentially harmful chemicals to create the concentrations of delta-8 THC claimed in the marketplace. The final delta-8 THC product may have potentially harmful by-products (contaminants) due to the chemicals used in the process. Manufacturing of delta-8 THC products may occur in uncontrolled or unsanitary settings, which may lead to the presence of unsafe contaminants or other potentially harmful substances. Delta-8 THC products should be kept out of the reach of children and pets.  NOTE: Because a positive UDS for any illicit substance is a violation of our medication agreement, your opioid analgesics (pain medicine) may be permanently discontinued.  MORE ABOUT CBD  General Information: CBD was discovered in 1940 and it is a derivative of  the cannabis sativa genus plants (Marijuana and Hemp). It is one of the 113 identified substances found in Marijuana. It accounts for up to 40% of the plant's extract. As of 2018, preliminary clinical studies on CBD included research for the treatment of anxiety, movement disorders, and pain. CBD is available and consumed in multiple forms, including inhalation of smoke or vapor, as an aerosol spray, and by mouth. It may be supplied as an oil containing CBD, capsules, dried cannabis, or as a liquid solution. CBD is thought not to be as psychoactive as THC (delta-9-tetrahydrocannabinol - the chemical in marijuana responsible for the "HIGH"). Studies suggest that CBD may interact with different biological target receptors in the body, including cannabinoid and other neurotransmitter receptors. As of 2018 the mechanism of action for its biological effects has not been determined.  Side-effects  Adverse reactions: Dry mouth, diarrhea, decreased appetite, fatigue, drowsiness, malaise, weakness, sleep disturbances, and others.  Drug interactions:  CBD may interact with medications such as blood-thinners. CBD causes drowsiness on its own and it will increase drowsiness caused by other medications, including antihistamines (such as Benadryl), benzodiazepines (Xanax, Ativan, Valium), antipsychotics, antidepressants, opioids, alcohol and supplements such as kava, melatonin and St. John's Wort.  Other drug interactions: Brivaracetam (Briviact); Caffeine; Carbamazepine (Tegretol); Citalopram (Celexa); Clobazam (Onfi); Eslicarbazepine (Aptiom); Everolimus (Zostress); Lithium; Methadone (Dolophine); Rufinamide (Banzel); Sedative medications (CNS depressants); Sirolimus (Rapamune); Stiripentol (Diacomit); Tacrolimus (Prograf); Tamoxifen ; Soltamox); Topiramate (Topamax); Valproate; Warfarin (Coumadin); Zonisamide. (Last update:  06/10/2022) ____________________________________________________________________________________________   ____________________________________________________________________________________________  Naloxone Nasal Spray  Why am I receiving this medication? [place] STOP ACT requires that all patients taking high dose opioids or at risk of opioids respiratory depression, be prescribed an opioid reversal agent, such as Naloxone (AKA: Narcan).  What is this medication? NALOXONE (nal OX one) treats opioid overdose, which causes slow or shallow breathing, severe drowsiness, or trouble staying awake. Call emergency services after using this medication. You may need additional treatment. Naloxone works by reversing the effects of opioids. It belongs to a group of medications called opioid blockers.  COMMON BRAND NAME(S): Kloxxado, Narcan  What should I tell my care team before I take this medication? They need to know if you have any of these conditions: Heart disease Substance use disorder An unusual or allergic reaction to naloxone, other medications, foods, dyes, or preservatives Pregnant or trying to get pregnant Breast-feeding  When to use this medication? This medication is to be used for the treatment of respiratory depression (less than 8 breaths per minute) secondary to opioid overdose.   How to use this medication? This medication is for use in the nose. Lay the person on their   back. Support their neck with your hand and allow the head to tilt back before giving the medication. The nasal spray should be given into 1 nostril. After giving the medication, move the person onto their side. Do not remove or test the nasal spray until ready to use. Get emergency medical help right away after giving the first dose of this medication, even if the person wakes up. You should be familiar with how to recognize the signs and symptoms of a narcotic overdose. If more doses are needed, give  the additional dose in the other nostril. Talk to your care team about the use of this medication in children. While this medication may be prescribed for children as young as newborns for selected conditions, precautions do apply.  Naloxone Overdosage: If you think you have taken too much of this medicine contact a poison control center or emergency room at once.  NOTE: This medicine is only for you. Do not share this medicine with others.  What if I miss a dose? This does not apply.  What may interact with this medication? This is only used during an emergency. No interactions are expected during emergency use. This list may not describe all possible interactions. Give your health care provider a list of all the medicines, herbs, non-prescription drugs, or dietary supplements you use. Also tell them if you smoke, drink alcohol, or use illegal drugs. Some items may interact with your medicine.  What should I watch for while using this medication? Keep this medication ready for use in the case of an opioid overdose. Make sure that you have the phone number of your care team and local hospital ready. You may need to have additional doses of this medication. Each nasal spray contains a single dose. Some emergencies may require additional doses. After use, bring the treated person to the nearest hospital or call 911. Make sure the treating care team knows that the person has received a dose of this medication. You will receive additional instructions on what to do during and after use of this medication before an emergency occurs.  What side effects may I notice from receiving this medication? Side effects that you should report to your care team as soon as possible: Allergic reactions--skin rash, itching, hives, swelling of the face, lips, tongue, or throat Side effects that usually do not require medical attention (report these to your care team if they continue or are  bothersome): Constipation Dryness or irritation inside the nose Headache Increase in blood pressure Muscle spasms Stuffy nose Toothache This list may not describe all possible side effects. Call your doctor for medical advice about side effects. You may report side effects to FDA at 1-800-FDA-1088.  Where should I keep my medication? Because this is an emergency medication, you should keep it with you at all times.  Keep out of the reach of children and pets. Store between 20 and 25 degrees C (68 and 77 degrees F). Do not freeze. Throw away any unused medication after the expiration date. Keep in original box until ready to use.  NOTE: This sheet is a summary. It may not cover all possible information. If you have questions about this medicine, talk to your doctor, pharmacist, or health care provider.   2023 Elsevier/Gold Standard (2021-02-23 00:00:00)  ____________________________________________________________________________________________   

## 2022-07-08 NOTE — Progress Notes (Signed)
Nursing Pain Medication Assessment:  Safety precautions to be maintained throughout the outpatient stay will include: orient to surroundings, keep bed in low position, maintain call bell within reach at all times, provide assistance with transfer out of bed and ambulation.  Medication Inspection Compliance: Pill count conducted under aseptic conditions, in front of the patient. Neither the pills nor the bottle was removed from the patient's sight at any time. Once count was completed pills were immediately returned to the patient in their original bottle.  Medication: See above Pill/Patch Count:  25 of 150 pills remain Pill/Patch Appearance: Markings consistent with prescribed medication Bottle Appearance: Standard pharmacy container. Clearly labeled. Filled Date: 35 / 14 / 2023 Last Medication intake:  TodaySafety precautions to be maintained throughout the outpatient stay will include: orient to surroundings, keep bed in low position, maintain call bell within reach at all times, provide assistance with transfer out of bed and ambulation.

## 2022-07-24 ENCOUNTER — Other Ambulatory Visit: Payer: Self-pay | Admitting: Orthopedic Surgery

## 2022-07-24 DIAGNOSIS — M25552 Pain in left hip: Secondary | ICD-10-CM

## 2022-07-24 DIAGNOSIS — M899 Disorder of bone, unspecified: Secondary | ICD-10-CM

## 2022-08-09 ENCOUNTER — Other Ambulatory Visit: Payer: Self-pay | Admitting: Neurological Surgery

## 2022-08-09 ENCOUNTER — Other Ambulatory Visit: Payer: Medicare Other

## 2022-08-09 DIAGNOSIS — M4316 Spondylolisthesis, lumbar region: Secondary | ICD-10-CM

## 2022-08-12 ENCOUNTER — Ambulatory Visit
Admission: RE | Admit: 2022-08-12 | Discharge: 2022-08-12 | Disposition: A | Discharge status: Home / Self Care | Payer: Medicare Other | Source: Ambulatory Visit | Attending: Orthopedic Surgery | Admitting: Orthopedic Surgery

## 2022-08-12 DIAGNOSIS — M25551 Pain in right hip: Secondary | ICD-10-CM

## 2022-08-12 DIAGNOSIS — M899 Disorder of bone, unspecified: Secondary | ICD-10-CM

## 2022-08-22 ENCOUNTER — Ambulatory Visit
Admission: RE | Admit: 2022-08-22 | Discharge: 2022-08-22 | Disposition: A | Discharge status: Home / Self Care | Payer: Medicare Other | Source: Ambulatory Visit | Attending: Neurological Surgery | Admitting: Neurological Surgery

## 2022-08-22 DIAGNOSIS — M4316 Spondylolisthesis, lumbar region: Secondary | ICD-10-CM | POA: Diagnosis not present

## 2022-08-26 ENCOUNTER — Telehealth: Payer: Self-pay

## 2022-09-02 ENCOUNTER — Ambulatory Visit: Payer: PRIVATE HEALTH INSURANCE

## 2022-09-04 NOTE — Progress Notes (Signed)
Follow Up Pharmacist Visit ([facility])  Jacqueline Thornton,Jacqueline Thornton  63 years, Female  DOB: 12/12/1950  M: [phone] Clinical Summary  Next Pharmacist Follow Up: 09/01/23 1.30PM - yet to be put in the calender due to template  Next AWV: yet to be scheduled Summary for PCP:  -Recommended regular BP monitoring - Recommended increasing water intake - Recommend shingles vaccine - HC to general assessment at 4 and 8 months  Patient's Chronic Conditions: Hypertension (HTN), Hyperlipidemia/Dyslipidemia (HLD), Anxiety, Osteopenia or Osteoporosis, Chronic Pain . Disease Assessments Visit Date Visit Completed on: 09/02/2022 . Subjective Information Subjective: She cooks, cleans and watches TV, talk to friends on phone. She has 2nd back surgery in October, and she is not doing well. She is living with her husband, married 66yr. She has 2 children (son and daughter), 4 grandchildren. She gets to see her kid's family every so often. She moves around the house normally; main problem is back pain. She has cholesterol and BP pill, depression med, fluid meds (for leg swelling) with stocking. Sometimes she wears the stocking day and night. Lifestyle habits: Diet: BF she has eggs, Lunch is sandwich, supper is porkchops, chicken, spaghetti, They keep some snacks. She drinks 2 glasses a day of water. She drinks tea, Kool aid, Pepsi, water and some coffee. She uses some dairy - chocolate milk, cooking with dairy.  Smoking - quit 238yrago  Alcohol: occasional glass of eggnog/wine What is the patient's sleep pattern?: Trouble falling asleep, Other (with free form text) Details: Takes Melatonin to help sleep How many hours per night does patient typically sleep?: 8hrs and occasional day time naps . SDOH: Accountable [facility] Communities Health-Related Social Needs Screening Tool ([email or web address] questions were documented in [facility] within the past 12 months  or since hospitalization?: No Are you completing SDOH today?: Yes What is your living situation today? (ref #1): I have a steady place to live Think about the place you live. Do you have problems with any of the following? (ref #2): None of the above Within the past 12 months, you worried that your food would run out before you got money to buy more (ref #3): Never true Within the past 12 months, the food you bought just didn't last and you didn't have money to get more (ref #4): Never true In the past 12 months, has lack of reliable transportation kept you from medical appointments, meetings, work or from getting things needed for daily living? (ref #5): No In the past 12 months, has the electric, gas, oil, or water company threatened to shut off services in your home? (ref #6): No How often does anyone, including family and friends, physically hurt you? (ref #7): Never (1) How often does anyone, including family and friends, insult or talk down to you? (ref #8): Never (1) How often does anyone, including friends and family, threaten you with harm? (ref #9): Never (1) How often does anyone, including family and friends, scream or curse at you? (ref #10): Never (1) . Medication Adherence [facility]?: Yes Medication adherence rates for the STAR metric medications: Atorvastatin 20 mg - 06/13/22 90 DS Enalapril 5 mg- 07/25/22 90 DS Medication adherence rates for non-STAR metric medications: N/A Is Patient using [facility]?: No Name and location of Current pharmacy: [facility] Current Rx insurance plan: [employer] Are meds delivered by current pharmacy?: No - delivery not available . Hypertension (HTN) Most Recent BP: 110/62 Most Recent HR: 77 taken on: 08/21/2022 Care Gap:  Need BP documented or last BP 140/90 or higher: Needs to be addressed Assessed today?: Yes BP today is: 118/68 Goal: <130/80 mmHG Is Patient checking BP at home?: Yes Has  patient experienced hypotension, dizziness, falls or bradycardia?: Yes Details: She occasionally gets light headed. Additional Info: She wakes up at [facility] every morning with horrible pain, then BP goes up to 138/80. 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 We discussed: Contacting PCP office for signs and symptoms of high or low blood pressure (hypotension, dizziness, falls, headaches, edema), Proper Home BP Measurement Assessment:: Controlled Drug: enalapril '5mg'$  daily Pharmacist Assessment: Appropriate, Effective, Safe, Accessible Drug: spironolactone '25mg'$  1/2 tablet daily Pharmacist Assessment: Appropriate, Effective, Safe, Accessible Drug: furosemide '20mg'$  daily  Pharmacist Assessment: Appropriate, Effective, Safe, Accessible . Hyperlipidemia/Dyslipidemia (HLD) Last Lipid panel on: 02/01/2022 TC (Goal<200): 181 LDL: 94 HDL (Goal>40): 52 TG (Goal<150): 206 ASCVD 10-year risk?is:: Intermediate (7.5%-20%) ASCVD Risk Score: 13.6 % Assessed today?: Yes LDL Goal: <70 Patient has tried and failed: simvastatin Additional Info: tried multiple statins and they all gave her cramps. This is the first one that she can tolerate. Assessment:: Uncontrolled Drug: atorvastatin '20mg'$  daily Pharmacist Assessment: Appropriate, Query Effectiveness . Osteopenia or Osteoporosis Most recent Vitamin D 25-OH: 31.5 taken on: 12/03/2019 Assessed today?: No . Chronic Pain Assessed today?: Yes What time of day is your pain at it's worst?: Morning Any issues with constipation related to your medications?: Yes What is the patient's current bowel regimen?: Every other day Drug: Oxycodone-APAP 7.'5mg'$ -'325mg'$  Pharmacist Assessment: Appropriate, Query Effectiveness Drug: Gabapentin '300mg'$  TID  Preventative Health Care Gap: Colorectal cancer screening: Addressed Care Gap: Breast cancer screening: Addressed Care Gap: Annual Wellness Visit (AWV): Needs to be  addressed Immunizations needed: Zoster, Pneumococcal, Tdap or Td  Recommended CRN Follow-Up in: Not indicated at this time  Charlann Lange on 08/26/2022 12:07 PM HC Chart Review: 22 min 08/22/22 OPT - 10 min 08/29/22 OPT HC Assessment call time spent: Unsuccessful Outreach  5 min 08/29/22 OPT Jerral Ralph, PharmD  Televisit 18mns  Documentation 140ms

## 2022-10-02 ENCOUNTER — Ambulatory Visit: Payer: Medicare Other | Attending: Pain Medicine | Admitting: Pain Medicine

## 2022-10-02 ENCOUNTER — Encounter: Payer: Self-pay | Admitting: Pain Medicine

## 2022-10-02 VITALS — BP 128/72 | HR 77 | Temp 97.3°F | Ht 60.0 in | Wt 142.0 lb

## 2022-10-02 DIAGNOSIS — M7062 Trochanteric bursitis, left hip: Secondary | ICD-10-CM

## 2022-10-02 DIAGNOSIS — G894 Chronic pain syndrome: Secondary | ICD-10-CM | POA: Diagnosis not present

## 2022-10-02 DIAGNOSIS — M545 Low back pain, unspecified: Secondary | ICD-10-CM | POA: Diagnosis present

## 2022-10-02 DIAGNOSIS — M25552 Pain in left hip: Secondary | ICD-10-CM | POA: Insufficient documentation

## 2022-10-02 DIAGNOSIS — Z79891 Long term (current) use of opiate analgesic: Secondary | ICD-10-CM | POA: Diagnosis present

## 2022-10-02 DIAGNOSIS — M51369 Other intervertebral disc degeneration, lumbar region without mention of lumbar back pain or lower extremity pain: Secondary | ICD-10-CM

## 2022-10-02 DIAGNOSIS — M47816 Spondylosis without myelopathy or radiculopathy, lumbar region: Secondary | ICD-10-CM

## 2022-10-02 DIAGNOSIS — R252 Cramp and spasm: Secondary | ICD-10-CM

## 2022-10-02 DIAGNOSIS — M25551 Pain in right hip: Secondary | ICD-10-CM

## 2022-10-02 DIAGNOSIS — M47817 Spondylosis without myelopathy or radiculopathy, lumbosacral region: Secondary | ICD-10-CM

## 2022-10-02 DIAGNOSIS — M961 Postlaminectomy syndrome, not elsewhere classified: Secondary | ICD-10-CM | POA: Diagnosis not present

## 2022-10-02 DIAGNOSIS — M21372 Foot drop, left foot: Secondary | ICD-10-CM | POA: Diagnosis present

## 2022-10-02 DIAGNOSIS — M542 Cervicalgia: Secondary | ICD-10-CM | POA: Diagnosis present

## 2022-10-02 DIAGNOSIS — M79605 Pain in left leg: Secondary | ICD-10-CM | POA: Insufficient documentation

## 2022-10-02 DIAGNOSIS — Z79899 Other long term (current) drug therapy: Secondary | ICD-10-CM

## 2022-10-02 DIAGNOSIS — M533 Sacrococcygeal disorders, not elsewhere classified: Secondary | ICD-10-CM | POA: Diagnosis present

## 2022-10-02 DIAGNOSIS — M7061 Trochanteric bursitis, right hip: Secondary | ICD-10-CM

## 2022-10-02 DIAGNOSIS — M79604 Pain in right leg: Secondary | ICD-10-CM | POA: Diagnosis present

## 2022-10-02 DIAGNOSIS — M5136 Other intervertebral disc degeneration, lumbar region: Secondary | ICD-10-CM | POA: Diagnosis not present

## 2022-10-02 DIAGNOSIS — G8929 Other chronic pain: Secondary | ICD-10-CM | POA: Diagnosis present

## 2022-10-02 MED ORDER — OXYCODONE-ACETAMINOPHEN 7.5-325 MG PO TABS: 1.0000 | ORAL_TABLET | Freq: Every day | ORAL | 0 refills | Status: DC

## 2022-10-02 MED ORDER — NALOXONE HCL 4 MG/0.1ML NA LIQD: 1.0000 | NASAL | 0 refills | Status: DC | PRN

## 2022-10-02 NOTE — Patient Instructions (Addendum)
______________________________________________________________________  Procedure instructions  Do not eat or drink fluids (other than water) for 6 hours before your procedure  No water for 2 hours before your procedure  Take your blood pressure medicine with a sip of water  Arrive 30 minutes before your appointment  Carefully read the "Preparing for your procedure" detailed instructions  If you have questions call us at [phone]  _____________________________________________________________________    ______________________________________________________________________  Preparing for your procedure  Appointments: If you think you may not be able to keep your appointment, call 24-48 hours in advance to cancel. We need time to make it available to others.  During your procedure appointment there will be: No Prescription Refills. No disability issues to discussed. No medication changes or discussions.  Instructions: Food intake: Avoid eating anything solid for at least 8 hours prior to your procedure. Clear liquid intake: You may take clear liquids such as water up to 2 hours prior to your procedure. (No carbonated drinks. No soda.) Transportation: Unless otherwise stated by your physician, bring a driver. Morning Medicines: Except for blood thinners, take all of your other morning medications with a sip of water. Make sure to take your heart and blood pressure medicines. If your blood pressure's lower number is above 100, the case will be rescheduled. Blood thinners: Make sure to stop your blood thinners as instructed.  If you take a blood thinner, but were not instructed to stop it, call our office [phone] and ask to talk to a nurse. Not stopping a blood thinner prior to certain procedures could lead to serious complications. Diabetics on insulin: Notify the staff so that you can be scheduled 1st case in the morning. If your diabetes requires high dose insulin,  take only  of your normal insulin dose the morning of the procedure and notify the staff that you have done so. Preventing infections: Shower with an antibacterial soap the morning of your procedure.  Build-up your immune system: Take 1000 mg of Vitamin C with every meal (3 times a day) the day prior to your procedure. Antibiotics: Inform the nursing staff if you are taking any antibiotics or if you have any conditions that may require antibiotics prior to procedures. (Example: recent joint implants)   Pregnancy: If you are pregnant make sure to notify the nursing staff. Not doing so may result in injury to the fetus, including death.  Sickness: If you have a cold, fever, or any active infections, call and cancel or reschedule your procedure. Receiving steroids while having an infection may result in complications. Arrival: You must be in the facility at least 30 minutes prior to your scheduled procedure. Tardiness: Your scheduled time is also the cutoff time. If you do not arrive at least 15 minutes prior to your procedure, you will be rescheduled.  Children: Do not bring any children with you. Make arrangements to keep them home. Dress appropriately: There is always a possibility that your clothing may get soiled. Avoid long dresses. Valuables: Do not bring any jewelry or valuables.  Reasons to call and reschedule or cancel your procedure: (Following these recommendations will minimize the risk of a serious complication.) Surgeries: Avoid having procedures within 2 weeks of any surgery. (Avoid for 2 weeks before or after any surgery). Flu Shots: Avoid having procedures within 2 weeks of a flu shots or . (Avoid for 2 weeks before or after immunizations). Barium: Avoid having a procedure within 7-10 days after having had a radiological study involving the use of   radiological contrast. (Myelograms, Barium swallow or enema study). Heart attacks: Avoid any elective procedures or surgeries for the  initial 6 months after a "Myocardial Infarction" (Heart Attack). Blood thinners: It is imperative that you stop these medications before procedures. Let us know if you if you take any blood thinner.  Infection: Avoid procedures during or within two weeks of an infection (including chest colds or gastrointestinal problems). Symptoms associated with infections include: Localized redness, fever, chills, night sweats or profuse sweating, burning sensation when voiding, cough, congestion, stuffiness, runny nose, sore throat, diarrhea, nausea, vomiting, cold or Flu symptoms, recent or current infections. It is specially important if the infection is over the area that we intend to treat. Heart and lung problems: Symptoms that may suggest an active cardiopulmonary problem include: cough, chest pain, breathing difficulties or shortness of breath, dizziness, ankle swelling, uncontrolled high or unusually low blood pressure, and/or palpitations. If you are experiencing any of these symptoms, cancel your procedure and contact your primary care physician for an evaluation.  Remember:  Regular Business hours are:  Monday to Thursday 8:00 AM to 4:00 PM  Provider's Schedule: Valda Christenson, MD:  Procedure days: Tuesday and Thursday 7:30 AM to 4:00 PM  Bilal Lateef, MD:  Procedure days: Monday and Wednesday 7:30 AM to 4:00 PM  ______________________________________________________________________    ____________________________________________________________________________________________  General Risks and Possible Complications  Patient Responsibilities: It is important that you read this as it is part of your informed consent. It is our duty to inform you of the risks and possible complications associated with treatments offered to you. It is your responsibility as a patient to read this and to ask questions about anything that is not clear or that you believe was not covered in this  document.  Patient's Rights: You have the right to refuse treatment. You also have the right to change your mind, even after initially having agreed to have the treatment done. However, under this last option, if you wait until the last second to change your mind, you may be charged for the materials used up to that point.  Introduction: Medicine is not an exact science. Everything in Medicine, including the lack of treatment(s), carries the potential for danger, harm, or loss (which is by definition: Risk). In Medicine, a complication is a secondary problem, condition, or disease that can aggravate an already existing one. All treatments carry the risk of possible complications. The fact that a side effects or complications occurs, does not imply that the treatment was conducted incorrectly. It must be clearly understood that these can happen even when everything is done following the highest safety standards.  No treatment: You can choose not to proceed with the proposed treatment alternative. The "PRO(s)" would include: avoiding the risk of complications associated with the therapy. The "CON(s)" would include: not getting any of the treatment benefits. These benefits fall under one of three categories: diagnostic; therapeutic; and/or palliative. Diagnostic benefits include: getting information which can ultimately lead to improvement of the disease or symptom(s). Therapeutic benefits are those associated with the successful treatment of the disease. Finally, palliative benefits are those related to the decrease of the primary symptoms, without necessarily curing the condition (example: decreasing the pain from a flare-up of a chronic condition, such as incurable terminal cancer).  General Risks and Complications: These are associated to most interventional treatments. They can occur alone, or in combination. They fall under one of the following six (6) categories: no benefit or worsening of symptoms;    bleeding; infection; nerve damage; allergic reactions; and/or death. No benefits or worsening of symptoms: In Medicine there are no guarantees, only probabilities. No healthcare provider can ever guarantee that a medical treatment will work, they can only state the probability that it may. Furthermore, there is always the possibility that the condition may worsen, either directly, or indirectly, as a consequence of the treatment. Bleeding: This is more common if the patient is taking a blood thinner, either prescription or over the counter (example: Goody Powders, Fish oil, Aspirin, Garlic, etc.), or if suffering a condition associated with impaired coagulation (example: Hemophilia, cirrhosis of the liver, low platelet counts, etc.). However, even if you do not have one on these, it can still happen. If you have any of these conditions, or take one of these drugs, make sure to notify your treating physician. Infection: This is more common in patients with a compromised immune system, either due to disease (example: diabetes, cancer, human immunodeficiency virus [HIV], etc.), or due to medications or treatments (example: therapies used to treat cancer and rheumatological diseases). However, even if you do not have one on these, it can still happen. If you have any of these conditions, or take one of these drugs, make sure to notify your treating physician. Nerve Damage: This is more common when the treatment is an invasive one, but it can also happen with the use of medications, such as those used in the treatment of cancer. The damage can occur to small secondary nerves, or to large primary ones, such as those in the spinal cord and brain. This damage may be temporary or permanent and it may lead to impairments that can range from temporary numbness to permanent paralysis and/or brain death. Allergic Reactions: Any time a substance or material comes in contact with our body, there is the possibility of an  allergic reaction. These can range from a mild skin rash (contact dermatitis) to a severe systemic reaction (anaphylactic reaction), which can result in death. Death: In general, any medical intervention can result in death, most of the time due to an unforeseen complication. ____________________________________________________________________________________________    ____________________________________________________________________________________________  Opioid Pain Medication Update  To: All patients taking opioid pain medications. (I.e.: hydrocodone, hydromorphone, oxycodone, oxymorphone, morphine, codeine, methadone, tapentadol, tramadol, buprenorphine, fentanyl, etc.)  Re: Updated review of side effects and adverse reactions of opioid analgesics, as well as new information about long term effects of this class of medications.  Direct risks of long-term opioid therapy are not limited to opioid addiction and overdose. Potential medical risks include serious fractures, breathing problems during sleep, hyperalgesia, immunosuppression, chronic constipation, bowel obstruction, myocardial infarction, and tooth decay secondary to xerostomia.  Unpredictable adverse effects that can occur even if you take your medication correctly: Cognitive impairment, respiratory depression, and death. Most people think that if they take their medication "correctly", and "as instructed", that they will be safe. Nothing could be farther from the truth. In reality, a significant amount of recorded deaths associated with the use of opioids has occurred in individuals that had taken the medication for a long time, and were taking their medication correctly. The following are examples of how this can happen: Patient taking his/her medication for a long time, as instructed, without any side effects, is given a certain antibiotic or another unrelated medication, which in turn triggers a "Drug-to-drug interaction"  leading to disorientation, cognitive impairment, impaired reflexes, respiratory depression or an untoward event leading to serious bodily harm or injury, including death.  Patient taking   his/her medication for a long time, as instructed, without any side effects, develops an acute impairment of liver and/or kidney function. This will lead to a rapid inability of the body to breakdown and eliminate their pain medication, which will result in effects similar to an "overdose", but with the same medicine and dose that they had always taken. This again may lead to disorientation, cognitive impairment, impaired reflexes, respiratory depression or an untoward event leading to serious bodily harm or injury, including death.  A similar problem will occur with patients as they grow older and their liver and kidney function begins to decrease as part of the aging process.  Background information: Historically, the original case for using long-term opioid therapy to treat chronic noncancer pain was based on safety assumptions that subsequent experience has called into question. In 1996, the American Pain Society and the American Academy of Pain Medicine issued a consensus statement supporting long-term opioid therapy. This statement acknowledged the dangers of opioid prescribing but concluded that the risk for addiction was low; respiratory depression induced by opioids was short-lived, occurred mainly in opioid-naive patients, and was antagonized by pain; tolerance was not a common problem; and efforts to control diversion should not constrain opioid prescribing. This has now proven to be wrong. Experience regarding the risks for opioid addiction, misuse, and overdose in community practice has failed to support these assumptions.  According to the Centers for Disease Control and Prevention, fatal overdoses involving opioid analgesics have increased sharply over the past decade. Currently, more than 96,700 people die  from drug overdoses every year. Opioids are a factor in 7 out of every 10 overdose deaths. Deaths from drug overdose have surpassed motor vehicle accidents as the leading cause of death for individuals between the ages of 35 and 54.  Clinical data suggest that neuroendocrine dysfunction may be very common in both men and women, potentially causing hypogonadism, erectile dysfunction, infertility, decreased libido, osteoporosis, and depression. Recent studies linked higher opioid dose to increased opioid-related mortality. Controlled observational studies reported that long-term opioid therapy may be associated with increased risk for cardiovascular events. Subsequent meta-analysis concluded that the safety of long-term opioid therapy in elderly patients has not been proven.   Side Effects and adverse reactions: Common side effects: Drowsiness (sedation). Dizziness. Nausea and vomiting. Constipation. Physical dependence -- Dependence often manifests with withdrawal symptoms when opioids are discontinued or decreased. Tolerance -- As you take repeated doses of opioids, you require increased medication to experience the same effect of pain relief. Respiratory depression -- This can occur in healthy people, especially with higher doses. However, people with COPD, asthma or other lung conditions may be even more susceptible to fatal respiratory impairment.  Uncommon side effects: An increased sensitivity to feeling pain and extreme response to pain (hyperalgesia). Chronic use of opioids can lead to this. Delayed gastric emptying (the process by which the contents of your stomach are moved into your small intestine). Muscle rigidity. Immune system and hormonal dysfunction. Quick, involuntary muscle jerks (myoclonus). Arrhythmia. Itchy skin (pruritus). Dry mouth (xerostomia).  Long-term side effects: Chronic constipation. Sleep-disordered breathing (SDB). Increased risk of bone  fractures. Hypothalamic-pituitary-adrenal dysregulation. Increased risk of overdose.  RISKS: Fractures and Falls:  Opioids increase the risk and incidence of falls. This is of particular importance in elderly patients.  Endocrine System:  Long-term administration is associated with endocrine abnormalities (endocrinopathies). (Also known as Opioid-induced Endocrinopathy) Influences on both the hypothalamic-pituitary-adrenal axis?and the hypothalamic-pituitary-gonadal axis have been demonstrated with consequent hypogonadism and adrenal   insufficiency in both sexes. Hypogonadism and decreased levels of dehydroepiandrosterone sulfate have been reported in men and women. Endocrine effects include: Amenorrhoea in women (abnormal absence of menstruation) Reduced libido in both sexes Decreased sexual function Erectile dysfunction in men Hypogonadisms (decreased testicular function with shrinkage of testicles) Infertility Depression and fatigue Loss of muscle mass Anxiety Depression Immune suppression Hyperalgesia Weight gain Anemia Osteoporosis Patients (particularly women of childbearing age) should avoid opioids. There is insufficient evidence to recommend routine monitoring of asymptomatic patients taking opioids in the long-term for hormonal deficiencies.  Immune System: Human studies have demonstrated that opioids have an immunomodulating effect. These effects are mediated via opioid receptors both on immune effector cells and in the central nervous system. Opioids have been demonstrated to have adverse effects on antimicrobial response and anti-tumour surveillance. Buprenorphine has been demonstrated to have no impact on immune function.  Opioid Induced Hyperalgesia: Human studies have demonstrated that prolonged use of opioids can lead to a state of abnormal pain sensitivity, sometimes called opioid induced hyperalgesia (OIH). Opioid induced hyperalgesia is not usually seen in the  absence of tolerance to opioid analgesia. Clinically, hyperalgesia may be diagnosed if the patient on long-term opioid therapy presents with increased pain. This might be qualitatively and anatomically distinct from pain related to disease progression or to breakthrough pain resulting from development of opioid tolerance. Pain associated with hyperalgesia tends to be more diffuse than the pre-existing pain and less defined in quality. Management of opioid induced hyperalgesia requires opioid dose reduction.  Cancer: Chronic opioid therapy has been associated with an increased risk of cancer among noncancer patients with chronic pain. This association was more evident in chronic strong opioid users. Chronic opioid consumption causes significant pathological changes in the small intestine and colon. Epidemiological studies have found that there is a link between opium dependence and initiation of gastrointestinal cancers. Cancer is the second leading cause of death after cardiovascular disease. Chronic use of opioids can cause multiple conditions such as GERD, immunosuppression and renal damage as well as carcinogenic effects, which are associated with the incidence of cancers.   Mortality: Long-term opioid use has been associated with increased mortality among patients with chronic non-cancer pain (CNCP).  Prescription of long-acting opioids for chronic noncancer pain was associated with a significantly increased risk of all-cause mortality, including deaths from causes other than overdose.  Reference: Von Korff M, Kolodny A, Deyo RA, Chou R. Long-term opioid therapy reconsidered. Ann Intern Med. 2011 Sep 6;155(5):325-8. doi: 10.7326/[id number]. PMID: 21893626; PMCID: PMC3280085. Bedson J, Chen Y, Ashworth J, Hayward RA, Dunn KM, Jordan KP. Risk of adverse events in patients prescribed long-term opioids: A cohort study in the [place] Clinical Practice Research D[employer]. Eur J Pain. 2019  May;23(5):908-922. doi: 10.1002/ejp.1357. Epub 2019 Jan 31. PMID: 30620116. Colameco S, Coren JS, Ciervo CA. Continuous opioid treatment for chronic noncancer pain: a time for moderation in prescribing. Postgrad Med. 2009 Jul;121(4):61-6. doi: 10.3810/pgm.2009.07.2032. PMID: 19641271. Chou R, Turner JA, Devine EB, Hansen RN, Sullivan SD, Blazina I, Dana T, Bougatsos C, Deyo RA. The effectiveness and risks of long-term opioid therapy for chronic pain: a systematic review for a National Institutes of Health Pathways to Prevention Workshop. Ann Intern Med. 2015 Feb 17;162(4):276-86. doi: 10.7326/M14-2559. PMID: 25581257. Warner M, Chen LH, Makuc DM. NCHS Data Brief No. 22. [place]: Centers for Disease Control and Prevention; 2009. Sep, Increase in Fatal Poisonings Involving Opioid Analgesics in the [place], 1999-2006. Song IA, Choi HR, Oh TK. Long-term opioid use and mortality in patients with   chronic non-cancer pain: Ten-year follow-up study in [place] from 2010 through 2019. EClinicalMedicine. 2022 Jul 18;51:101558. doi: 10.1016/j.eclinm.2022.101558. PMID: 35875817; PMCID: PMC9304910. Huser, W., Schubert, T., Vogelmann, T. et al. All-cause mortality in patients with long-term opioid therapy compared with non-opioid analgesics for chronic non-cancer pain: a database study. BMC Med 18, 162 (2020). https://doi.org/10.1186/[email or web address] Rashidian H, Zendehdel K, Kamangar F, Malekzadeh R, Haghdoost AA. An Ecological Study of the Association between Opiate Use and Incidence of Cancers. Addict Health. 2016 Fall;8(4):252-260. PMID: 28819556; PMCID: PMC5554805.  Our Goal: Our goal is to control your pain with means other than the use of opioid pain medications.  Our Recommendation: Talk to your physician about coming off of these medications. We can assist you with the tapering down and stopping these medicines. Based on the new information, even if you cannot completely stop the medication, a  decrease in the dose may be associated with a lesser risk. Ask for other means of controlling the pain. Decrease or eliminate those factors that significantly contribute to your pain such as smoking, obesity, and a diet heavily tilted towards "inflammatory" nutrients.  Last Updated: 08/28/2022   ____________________________________________________________________________________________     ____________________________________________________________________________________________  Patient Information update  To: All of our patients.  Re: Name change.  It has been made official that our current name, "[facility]"   will soon be changed to "[facility]".   The purpose of this change is to eliminate any confusion created by the concept of our practice being a "Medication Management Pain Clinic". In the past this has led to the misconception that we treat pain primarily by the use of prescription medications.  Nothing can be farther from the truth.   Understanding PAIN MANAGEMENT: To further understand what our practice does, you first have to understand that "Pain Management" is a subspecialty that requires additional training once a physician has completed their specialty training, which can be in either Anesthesia, Neurology, Psychiatry, or Physical Medicine and Rehabilitation (PMR). Each one of these contributes to the final approach taken by each physician to the management of their patient's pain. To be a "Pain Management Specialist" you must have first completed one of the specialty trainings below.  Anesthesiologists - trained in clinical pharmacology and interventional techniques such as nerve blockade and regional as well as central neuroanatomy. They are trained to block pain before, during, and after surgical interventions.  Neurologists - trained in the diagnosis and  pharmacological treatment of complex neurological conditions, such as Multiple Sclerosis, Parkinson's, spinal cord injuries, and other systemic conditions that may be associated with symptoms that may include but are not limited to pain. They tend to rely primarily on the treatment of chronic pain using prescription medications.  Psychiatrist - trained in conditions affecting the psychosocial wellbeing of patients including but not limited to depression, anxiety, schizophrenia, personality disorders, addiction, and other substance use disorders that may be associated with chronic pain. They tend to rely primarily on the treatment of chronic pain using prescription medications.   Physical Medicine and Rehabilitation (PMR) physicians, also known as physiatrists - trained to treat a wide variety of medical conditions affecting the brain, spinal cord, nerves, bones, joints, ligaments, muscles, and tendons. Their training is primarily aimed at treating patients that have suffered injuries that have caused severe physical impairment. Their training is primarily aimed at the physical therapy and rehabilitation of those patients. They may also work alongside orthopedic surgeons or neurosurgeons   using their expertise in assisting surgical patients to recover after their surgeries.  INTERVENTIONAL PAIN MANAGEMENT is sub-subspecialty of Pain Management.  Our physicians are Board-certified in Anesthesia, Pain Management, and Interventional Pain Management.  This meaning that not only have they been trained and Board-certified in their specialty of Anesthesia, and subspecialty of Pain Management, but they have also received further training in the sub-subspecialty of Interventional Pain Management, in order to become Board-certified as INTERVENTIONAL PAIN MANAGEMENT SPECIALIST.    Mission: Our goal is to use our skills in  INTERVENTIONAL PAIN MANAGEMENT as alternatives to the chronic use of prescription opioid  medications for the treatment of pain. To make this more clear, we have changed our name to reflect what we do and offer. We will continue to offer medication management assessment and recommendations, but we will not be taking over any patient's medication management.  ____________________________________________________________________________________________     ____________________________________________________________________________________________  National Pain Medication Shortage  [place] experiencing worsening drug shortages. These have had a negative widespread effect on patient care and treatment. Not expected to improve any time soon. Predicted to last past 2029.   Drug shortage list (generic names) Oxycodone IR Oxycodone/APAP Oxymorphone IR Hydromorphone Hydrocodone/APAP Morphine  Where is the problem?  Manufacturing and supply level.  Will this shortage affect you?  Only if you take any of the above pain medications.  How? You may be unable to fill your prescription.  Your pharmacist may offer a "partial fill" of your prescription. (Warning: Do not accept partial fills.) Prescriptions partially filled cannot be transferred to another pharmacy. Read our Medication Rules and Regulation. Depending on how much medicine you are dependent on, you may experience withdrawals when unable to get the medication.  Recommendations: Consider ending your dependence on opioid pain medications. Ask your pain specialist to assist you with the process. Consider switching to a medication currently not in shortage, such as Buprenorphine. Talk to your pain specialist about this option. Consider decreasing your pain medication requirements by managing tolerance thru "Drug Holidays". This may help minimize withdrawals, should you run out of medicine. Control your pain thru the use of non-pharmacological interventional therapies.   Your prescriber: Prescribers cannot be blamed for  shortages. Medication manufacturing and supply issues cannot be fixed by the prescriber.   NOTE: The prescriber is not responsible for supplying the medication, or solving supply issues. Work with your pharmacist to solve it. The patient is responsible for the decision to take or continue taking the medication and for identifying and securing a legal supply source. By law, supplying the medication is the job and responsibility of the pharmacy. The prescriber is responsible for the evaluation, monitoring, and prescribing of these medications.   Prescribers will NOT: Re-issue prescriptions that have been partially filled. Re-issue prescriptions already sent to a pharmacy.  Re-send prescriptions to a different pharmacy because yours did not have your medication. Ask pharmacist to order more medicine or transfer the prescription to another pharmacy. (Read below.)  New 2023 regulation: "March 01, 2022 Revised Regulation Allows DEA-Registered Pharmacies to Transfer Electronic Prescriptions at a Patient's Request DEA Headquarters Division - Public Information Office Patients now have the ability to request their electronic prescription be transferred to another pharmacy without having to go back to their practitioner to initiate the request. This revised regulation went into effect on Monday, February 25, 2022.     At a patient's request, a DEA-registered retail pharmacy can now transfer an electronic prescription for a controlled substance (schedules   II-V) to another DEA-registered retail pharmacy. Prior to this change, patients would have to go through their practitioner to cancel their prescription and have it re-issued to a different pharmacy. The process was taxing and time consuming for both patients and practitioners.    The Drug Enforcement Administration (DEA) published its intent to revise the process for transferring electronic prescriptions on May 19, 2020.  The final rule was published  in the federal register on January 24, 2022 and went into effect 30 days later.  Under the final rule, a prescription can only be transferred once between pharmacies, and only if allowed under existing state or other applicable law. The prescription must remain in its electronic form; may not be altered in any way; and the transfer must be communicated directly between two licensed pharmacists. It's important to note, any authorized refills transfer with the original prescription, which means the entire prescription will be filled at the same pharmacy".  Reference: https://www.dea.gov/stories/2023/2023-03/2022-09-01/revised-regulation-allows-dea-registered-pharmacies-transfer (DEA website announcement)  https://www.govinfo.gov/content/pkg/FR-2022-01-24/pdf/2023-15847.pdf (Federal Register  Department of Justice)   Federal Register / Vol. 88, No. 143 / Thursday, January 24, 2022 / Rules and Regulations DEPARTMENT OF JUSTICE  Drug Enforcement Administration  21 CFR Part 1306  [Docket No. [id number]]  RIN 1117-AB64 Transfer of Electronic Prescriptions for Schedules II-V Controlled Substances Between Pharmacies for Initial Filling  ____________________________________________________________________________________________     ____________________________________________________________________________________________  Transfer of Pain Medication between Pharmacies  Re: 2023 DEA Clarification on existing regulation  Published on DEA Website: March 01, 2022  Title: Revised Regulation Allows DEA-Registered Pharmacies to Transfer Electronic Prescriptions at a Patient's Request DEA Headquarters Division - Public Information Office  "Patients now have the ability to request their electronic prescription be transferred to another pharmacy without having to go back to their practitioner to initiate the request. This revised regulation went into effect on Monday, February 25, 2022.     At a patient's  request, a DEA-registered retail pharmacy can now transfer an electronic prescription for a controlled substance (schedules II-V) to another DEA-registered retail pharmacy. Prior to this change, patients would have to go through their practitioner to cancel their prescription and have it re-issued to a different pharmacy. The process was taxing and time consuming for both patients and practitioners.    The Drug Enforcement Administration (DEA) published its intent to revise the process for transferring electronic prescriptions on May 19, 2020.  The final rule was published in the federal register on January 24, 2022 and went into effect 30 days later.  Under the final rule, a prescription can only be transferred once between pharmacies, and only if allowed under existing state or other applicable law. The prescription must remain in its electronic form; may not be altered in any way; and the transfer must be communicated directly between two licensed pharmacists. It's important to note, any authorized refills transfer with the original prescription, which means the entire prescription will be filled at the same pharmacy."    REFERENCES: 1. DEA website announcement https://www.dea.gov/stories/2023/2023-03/2022-09-01/revised-regulation-allows-dea-registered-pharmacies-transfer  2. Department of Justice website  https://www.govinfo.gov/content/pkg/FR-2022-01-24/pdf/2023-15847.pdf  3. DEPARTMENT OF JUSTICE Drug Enforcement Administration 21 CFR Part 1306 [Docket No. [id number]] RIN 1117-AB64 "Transfer of Electronic Prescriptions for Schedules II-V Controlled Substances Between Pharmacies for Initial Filling"  ____________________________________________________________________________________________     _______________________________________________________________________  Medication Rules  Purpose: To inform patients, and their family members, of our medication rules and  regulations.  Applies to: All patients receiving prescriptions from our practice (written or electronic).  Pharmacy of record: This is the pharmacy where your   electronic prescriptions will be sent. Make sure we have the correct one.  Electronic prescriptions: In compliance with the Ruth Strengthen Opioid Misuse Prevention (STOP) Act of 2017 (Session Law 2017-74/H243), effective July 01, 2018, all controlled substances must be electronically prescribed. Written prescriptions, faxing, or calling prescriptions to a pharmacy will no longer be done.  Prescription refills: These will be provided only during in-person appointments. No medications will be renewed without a "face-to-face" evaluation with your provider. Applies to all prescriptions.  NOTE: The following applies primarily to controlled substances (Opioid* Pain Medications).   Type of encounter (visit): For patients receiving controlled substances, face-to-face visits are required. (Not an option and not up to the patient.)  Patient's responsibilities: Pain Pills: Bring all pain pills to every appointment (except for procedure appointments). Pill Bottles: Bring pills in original pharmacy bottle. Bring bottle, even if empty. Always bring the bottle of the most recent fill.  Medication refills: You are responsible for knowing and keeping track of what medications you are taking and when is it that you will need a refill. The day before your appointment: write a list of all prescriptions that need to be refilled. The day of the appointment: give the list to the admitting nurse. Prescriptions will be written only during appointments. No prescriptions will be written on procedure days. If you forget a medication: it will not be "Called in", "Faxed", or "electronically sent". You will need to get another appointment to get these prescribed. No early refills. Do not call asking to have your prescription filled early. Partial  or short  prescriptions: Occasionally your pharmacy may not have enough pills to fill your prescription.  NEVER ACCEPT a partial fill or a prescription that is short of the total amount of pills that you were prescribed.  With controlled substances the law allows 72 hours for the pharmacy to complete the prescription.  If the prescription is not completed within 72 hours, the pharmacist will require a new prescription to be written. This means that you will be short on your medicine and we WILL NOT send another prescription to complete your original prescription.  Instead, request the pharmacy to send a carrier to a nearby branch to get enough medication to provide you with your full prescription. Prescription Accuracy: You are responsible for carefully inspecting your prescriptions before leaving our office. Have the discharge nurse carefully go over each prescription with you, before taking them home. Make sure that your name is accurately spelled, that your address is correct. Check the name and dose of your medication to make sure it is accurate. Check the number of pills, and the written instructions to make sure they are clear and accurate. Make sure that you are given enough medication to last until your next medication refill appointment. Taking Medication: Take medication as prescribed. When it comes to controlled substances, taking less pills or less frequently than prescribed is permitted and encouraged. Never take more pills than instructed. Never take the medication more frequently than prescribed.  Inform other Doctors: Always inform, all of your healthcare providers, of all the medications you take. Pain Medication from other Providers: You are not allowed to accept any additional pain medication from any other Doctor or Healthcare provider. There are two exceptions to this rule. (see below) In the event that you require additional pain medication, you are responsible for notifying us, as stated  below. Cough Medicine: Often these contain an opioid, such as codeine or hydrocodone. Never accept or take cough   medicine containing these opioids if you are already taking an opioid* medication. The combination may cause respiratory failure and death. Medication Agreement: You are responsible for carefully reading and following our Medication Agreement. This must be signed before receiving any prescriptions from our practice. Safely store a copy of your signed Agreement. Violations to the Agreement will result in no further prescriptions. (Additional copies of our Medication Agreement are available upon request.) Laws, Rules, & Regulations: All patients are expected to follow all Federal and State Laws, Statutes, Rules, & Regulations. Ignorance of the Laws does not constitute a valid excuse.  Illegal drugs and Controlled Substances: The use of illegal substances (including, but not limited to marijuana and its derivatives) and/or the illegal use of any controlled substances is strictly prohibited. Violation of this rule may result in the immediate and permanent discontinuation of any and all prescriptions being written by our practice. The use of any illegal substances is prohibited. Adopted CDC guidelines & recommendations: Target dosing levels will be at or below 60 MME/day. Use of benzodiazepines** is not recommended.  Exceptions: There are only two exceptions to the rule of not receiving pain medications from other Healthcare Providers. Exception #1 (Emergencies): In the event of an emergency (i.e.: accident requiring emergency care), you are allowed to receive additional pain medication. However, you are responsible for: As soon as you are able, call our office [phone], at any time of the day or night, and leave a message stating your name, the date and nature of the emergency, and the name and dose of the medication prescribed. In the event that your call is answered by a member of our staff,  make sure to document and save the date, time, and the name of the person that took your information.  Exception #2 (Planned Surgery): In the event that you are scheduled by another doctor or dentist to have any type of surgery or procedure, you are allowed (for a period no longer than 30 days), to receive additional pain medication, for the acute post-op pain. However, in this case, you are responsible for picking up a copy of our "Post-op Pain Management for Surgeons" handout, and giving it to your surgeon or dentist. This document is available at our office, and does not require an appointment to obtain it. Simply go to our office during business hours (Monday-Thursday from 8:00 AM to 4:00 PM) (Friday 8:00 AM to 12:00 Noon) or if you have a scheduled appointment with us, prior to your surgery, and ask for it by name. In addition, you are responsible for: calling our office [phone], at any time of the day or night, and leaving a message stating your name, name of your surgeon, type of surgery, and date of procedure or surgery. Failure to comply with your responsibilities may result in termination of therapy involving the controlled substances. Medication Agreement Violation. Following the above rules, including your responsibilities will help you in avoiding a Medication Agreement Violation ("Breaking your Pain Medication Contract").  Consequences:  Not following the above rules may result in permanent discontinuation of medication prescription therapy.  *Opioid medications include: morphine, codeine, oxycodone, oxymorphone, hydrocodone, hydromorphone, meperidine, tramadol, tapentadol, buprenorphine, fentanyl, methadone. **Benzodiazepine medications include: diazepam (Valium), alprazolam (Xanax), clonazepam (Klonopine), lorazepam (Ativan), clorazepate (Tranxene), chlordiazepoxide (Librium), estazolam (Prosom), oxazepam (Serax), temazepam (Restoril), triazolam (Halcion) (Last updated:  04/23/2022) ______________________________________________________________________    ______________________________________________________________________  Medication Recommendations and Reminders  Applies to: All patients receiving prescriptions (written and/or electronic).  Medication Rules & Regulations: You   are responsible for reading, knowing, and following our "Medication Rules" document. These exist for your safety and that of others. They are not flexible and neither are we. Dismissing or ignoring them is an act of "non-compliance" that may result in complete and irreversible termination of such medication therapy. For safety reasons, "non-compliance" will not be tolerated. As with the [place] fundamental legal principle of "ignorance of the law is no defense", we will accept no excuses for not having read and knowing the content of documents provided to you by our practice.  Pharmacy of record:  Definition: This is the pharmacy where your electronic prescriptions will be sent.  We do not endorse any particular pharmacy. It is up to you and your insurance to decide what pharmacy to use.  We do not restrict you in your choice of pharmacy. However, once we write for your prescriptions, we will NOT be re-sending more prescriptions to fix restricted supply problems created by your pharmacy, or your insurance.  The pharmacy listed in the electronic medical record should be the one where you want electronic prescriptions to be sent. If you choose to change pharmacy, simply notify our nursing staff. Changes will be made only during your regular appointments and not over the phone.  Recommendations: Keep all of your pain medications in a safe place, under lock and key, even if you live alone. We will NOT replace lost, stolen, or damaged medication. We do not accept "Police Reports" as proof of medications having been stolen. After you fill your prescription, take 1 week's worth of pills and put  them away in a safe place. You should keep a separate, properly labeled bottle for this purpose. The remainder should be kept in the original bottle. Use this as your primary supply, until it runs out. Once it's gone, then you know that you have 1 week's worth of medicine, and it is time to come in for a prescription refill. If you do this correctly, it is unlikely that you will ever run out of medicine. To make sure that the above recommendation works, it is very important that you make sure your medication refill appointments are scheduled at least 1 week before you run out of medicine. To do this in an effective manner, make sure that you do not leave the office without scheduling your next medication management appointment. Always ask the nursing staff to show you in your prescription , when your medication will be running out. Then arrange for the receptionist to get you a return appointment, at least 7 days before you run out of medicine. Do not wait until you have 1 or 2 pills left, to come in. This is very poor planning and does not take into consideration that we may need to cancel appointments due to bad weather, sickness, or emergencies affecting our staff. DO NOT ACCEPT A "Partial Fill": If for any reason your pharmacy does not have enough pills/tablets to completely fill or refill your prescription, do not allow for a "partial fill". The law allows the pharmacy to complete that prescription within 72 hours, without requiring a new prescription. If they do not fill the rest of your prescription within those 72 hours, you will need a separate prescription to fill the remaining amount, which we will NOT provide. If the reason for the partial fill is your insurance, you will need to talk to the pharmacist about payment alternatives for the remaining tablets, but again, DO NOT ACCEPT A PARTIAL FILL, unless you can   trust your pharmacist to obtain the remainder of the pills within 72 hours.  Prescription  refills and/or changes in medication(s):  Prescription refills, and/or changes in dose or medication, will be conducted only during scheduled medication management appointments. (Applies to both, written and electronic prescriptions.) No refills on procedure days. No medication will be changed or started on procedure days. No changes, adjustments, and/or refills will be conducted on a procedure day. Doing so will interfere with the diagnostic portion of the procedure. No phone refills. No medications will be "called into the pharmacy". No Fax refills. No weekend refills. No Holliday refills. No after hours refills.  Remember:  Business hours are:  Monday to Thursday 8:00 AM to 4:00 PM Provider's Schedule: Mionna Advincula, MD - Appointments are:  Medication management: Monday and Wednesday 8:00 AM to 4:00 PM Procedure day: Tuesday and Thursday 7:30 AM to 4:00 PM Bilal Lateef, MD - Appointments are:  Medication management: Tuesday and Thursday 8:00 AM to 4:00 PM Procedure day: Monday and Wednesday 7:30 AM to 4:00 PM (Last update: 04/23/2022) ______________________________________________________________________    ____________________________________________________________________________________________  Drug Holidays  What is a "Drug Holiday"? Drug Holiday: is the name given to the process of slowly tapering down and temporarily stopping the pain medication for the purpose of decreasing or eliminating tolerance to the drug.  Benefits Improved effectiveness Decreased required effective dose Improved pain control End dependence on high dose therapy Decrease cost of therapy Uncovering "opioid-induced hyperalgesia". (OIH)  What is "opioid hyperalgesia"? It is a paradoxical increase in pain caused by exposure to opioids. Stopping the opioid pain medication, contrary to the expected, it actually decreases or completely eliminates the pain. Ref.: "A comprehensive review of  opioid-induced hyperalgesia". Marion Lee, et.al. Pain Physician. 2011 Mar-Apr;14(2):145-61.  What is tolerance? Tolerance: the progressive loss of effectiveness of a pain medicine due to repetitive use. A common problem of opioid pain medications.  How long should a "Drug Holiday" last? Effectiveness depends on the patient staying off all opioid pain medicines for a minimum of 14 consecutive days. (2 weeks)  How about just taking less of the medicine? Does not work. Will not accomplish goal of eliminating the excess receptors.  How about switching to a different pain medicine? (AKA. "Opioid rotation") Does not work. Creates the illusion of effectiveness by taking advantage of inaccurate equivalent dose calculations between different opioids. -This "technique" was promoted by studies funded by pharmaceutical companies, such as [employer], creators of "OxyContin".  Can I stop the medicine "cold turkey"? We do not recommend it. You should always coordinate with your prescribing physician to make the transition as smoothly as possible. Avoid stopping the medicine abruptly without consulting. We recommend a "slow taper".  What is a slow taper? Taper: refers to the gradual decrease in dose.   How do I stop/taper the dose? Slowly. Decrease the daily amount of pills that you take by one (1) pill every seven (7) days. This is called a "slow downward taper". Example: if you normally take four (4) pills per day, drop it to three (3) pills per day for seven (7) days, then to two (2) pills per day for seven (7) days, then to one (1) per day for seven (7) days, and then stop the medicine. The 14 day "Drug Holiday" starts on the first day without medicine.   Will I experience withdrawals? Unlikely with a slow taper.  What triggers withdrawals? Withdrawals are triggered by the sudden/abrupt stop of high dose opioids. Withdrawals can be avoided   by slowly decreasing the dose over a prolonged period of  time.  What are withdrawals? Symptoms associated with sudden/abrupt reduction/stopping of high-dose, long-term use of pain medication. Withdrawal are seldom seen on low dose therapy, or patients rarely taking opioid medication.  Early Withdrawal Symptoms may include: Agitation Anxiety Muscle aches Increased tearing Insomnia Runny nose Sweating Yawning  Late symptoms may include: Abdominal cramping Diarrhea Dilated pupils Goose bumps Nausea Vomiting  When could I see withdrawals? Onset: 8-24 hours after last use for most opioids. 12-48 hours for long-acting opioids (i.e.: methadone)  How long could they last? Duration: 4-10 days for most opioids. 14-21 days for long-acting opioids (i.e.: methadone)  What will happen after I complete my "Drug Holiday"? The need and indications for the opioid analgesic will be reviewed before restarting the medication. Dose requirements will likely decrease and the dose will need to be adjusted accordingly.   (Last update: 09/18/2022) ____________________________________________________________________________________________    ____________________________________________________________________________________________  WARNING: CBD (cannabidiol) & Delta (Delta-8 tetrahydrocannabinol) products.   Applicable to:  All individuals currently taking or considering taking CBD (cannabidiol) and, more important, all patients taking opioid analgesic controlled substances (pain medication). (Example: oxycodone; oxymorphone; hydrocodone; hydromorphone; morphine; methadone; tramadol; tapentadol; fentanyl; buprenorphine; butorphanol; dextromethorphan; meperidine; codeine; etc.)  Introduction:  Recently there has been a drive towards the use of "natural" products for the treatment of different conditions, including pain anxiety and sleep disorders. Marijuana and hemp are two varieties of the cannabis genus plants. Marijuana and its derivatives are  illegal, while hemp and its derivatives are not. Cannabidiol (CBD) and tetrahydrocannabinol (THC), are two natural compounds found in plants of the Cannabis genus. They can both be extracted from hemp or marijuana. Both compounds interact with your body's endocannabinoid system in very different ways. CBD is associated with pain relief (analgesia) while THC is associated with the psychoactive effects ("the high") obtained from the use of marijuana products. There are two main types of THC: Delta-9, which comes from the marijuana plant and it is illegal, and Delta-8, which comes from the hemp plant, and it is legal. (Both, Delta-9-THC and Delta-8-THC are psychoactive and give you "the high".)   Legality:  Marijuana and its derivatives: illegal Hemp and its derivatives: Legal (State dependent) UPDATE: (08/17/2021) The Drug Enforcement Agency (DEA) issued a letter stating that "delta" cannabinoids, including Delta-8-THCO and Delta-9-THCO, synthetically derived from hemp do not qualify as hemp and will be viewed as Schedule I drugs. (Schedule I drugs, substances, or chemicals are defined as drugs with no currently accepted medical use and a high potential for abuse. Some examples of Schedule I drugs are: heroin, lysergic acid diethylamide (LSD), marijuana (cannabis), 3,4-methylenedioxymethamphetamine (ecstasy), methaqualone, and peyote.) (https://www.dea.gov)  Legal status of CBD in [place]:  "Conditionally Legal"  Reference: "FDA Regulation of Cannabis and Cannabis-Derived Products, Including Cannabidiol (CBD)" - https://www.fda.gov/news-events/public-health-focus/fda-regulation-cannabis-and-cannabis-derived-products-including-cannabidiol-cbd  Warning:  CBD is not FDA approved and has not undergo the same manufacturing controls as prescription drugs.  This means that the purity and safety of available CBD may be questionable. Most of the time, despite manufacturer's claims, it is contaminated with THC  (delta-9-tetrahydrocannabinol - the chemical in marijuana responsible for the "HIGH").  When this is the case, the THC contaminant will trigger a positive urine drug screen (UDS) test for Marijuana (carboxy-THC).   The FDA recently put out a warning about 5 things that everyone should be aware of regarding Delta-8 THC: Delta-8 THC products have not been evaluated or approved by the FDA for safe use and may   be marketed in ways that put the public health at risk. The FDA has received adverse event reports involving delta-8 THC-containing products. Delta-8 THC has psychoactive and intoxicating effects. Delta-8 THC manufacturing often involve use of potentially harmful chemicals to create the concentrations of delta-8 THC claimed in the marketplace. The final delta-8 THC product may have potentially harmful by-products (contaminants) due to the chemicals used in the process. Manufacturing of delta-8 THC products may occur in uncontrolled or unsanitary settings, which may lead to the presence of unsafe contaminants or other potentially harmful substances. Delta-8 THC products should be kept out of the reach of children and pets.  NOTE: Because a positive UDS for any illicit substance is a violation of our medication agreement, your opioid analgesics (pain medicine) may be permanently discontinued.  MORE ABOUT CBD  General Information: CBD was discovered in 1940 and it is a derivative of the cannabis sativa genus plants (Marijuana and Hemp). It is one of the 113 identified substances found in Marijuana. It accounts for up to 40% of the plant's extract. As of 2018, preliminary clinical studies on CBD included research for the treatment of anxiety, movement disorders, and pain. CBD is available and consumed in multiple forms, including inhalation of smoke or vapor, as an aerosol spray, and by mouth. It may be supplied as an oil containing CBD, capsules, dried cannabis, or as a liquid solution. CBD is thought  not to be as psychoactive as THC (delta-9-tetrahydrocannabinol - the chemical in marijuana responsible for the "HIGH"). Studies suggest that CBD may interact with different biological target receptors in the body, including cannabinoid and other neurotransmitter receptors. As of 2018 the mechanism of action for its biological effects has not been determined.  Side-effects  Adverse reactions: Dry mouth, diarrhea, decreased appetite, fatigue, drowsiness, malaise, weakness, sleep disturbances, and others.  Drug interactions:  CBD may interact with medications such as blood-thinners. CBD causes drowsiness on its own and it will increase drowsiness caused by other medications, including antihistamines (such as Benadryl), benzodiazepines (Xanax, Ativan, Valium), antipsychotics, antidepressants, opioids, alcohol and supplements such as kava, melatonin and St. John's Wort.  Other drug interactions: Brivaracetam (Briviact); Caffeine; Carbamazepine (Tegretol); Citalopram (Celexa); Clobazam (Onfi); Eslicarbazepine (Aptiom); Everolimus (Zostress); Lithium; Methadone (Dolophine); Rufinamide (Banzel); Sedative medications (CNS depressants); Sirolimus (Rapamune); Stiripentol (Diacomit); Tacrolimus (Prograf); Tamoxifen ; Soltamox); Topiramate (Topamax); Valproate; Warfarin (Coumadin); Zonisamide. (Last update: 06/10/2022) ____________________________________________________________________________________________   ____________________________________________________________________________________________  Naloxone Nasal Spray  Why am I receiving this medication? [place] STOP ACT requires that all patients taking high dose opioids or at risk of opioids respiratory depression, be prescribed an opioid reversal agent, such as Naloxone (AKA: Narcan).  What is this medication? NALOXONE (nal OX one) treats opioid overdose, which causes slow or shallow breathing, severe drowsiness, or trouble staying awake. Call  emergency services after using this medication. You may need additional treatment. Naloxone works by reversing the effects of opioids. It belongs to a group of medications called opioid blockers.  COMMON BRAND NAME(S): Kloxxado, Narcan  What should I tell my care team before I take this medication? They need to know if you have any of these conditions: Heart disease Substance use disorder An unusual or allergic reaction to naloxone, other medications, foods, dyes, or preservatives Pregnant or trying to get pregnant Breast-feeding  When to use this medication? This medication is to be used for the treatment of respiratory depression (less than 8 breaths per minute) secondary to opioid overdose.   How to use this medication? This medication is   for use in the nose. Lay the person on their back. Support their neck with your hand and allow the head to tilt back before giving the medication. The nasal spray should be given into 1 nostril. After giving the medication, move the person onto their side. Do not remove or test the nasal spray until ready to use. Get emergency medical help right away after giving the first dose of this medication, even if the person wakes up. You should be familiar with how to recognize the signs and symptoms of a narcotic overdose. If more doses are needed, give the additional dose in the other nostril. Talk to your care team about the use of this medication in children. While this medication may be prescribed for children as young as newborns for selected conditions, precautions do apply.  Naloxone Overdosage: If you think you have taken too much of this medicine contact a poison control center or emergency room at once.  NOTE: This medicine is only for you. Do not share this medicine with others.  What if I miss a dose? This does not apply.  What may interact with this medication? This is only used during an emergency. No interactions are expected during emergency  use. This list may not describe all possible interactions. Give your health care provider a list of all the medicines, herbs, non-prescription drugs, or dietary supplements you use. Also tell them if you smoke, drink alcohol, or use illegal drugs. Some items may interact with your medicine.  What should I watch for while using this medication? Keep this medication ready for use in the case of an opioid overdose. Make sure that you have the phone number of your care team and local hospital ready. You may need to have additional doses of this medication. Each nasal spray contains a single dose. Some emergencies may require additional doses. After use, bring the treated person to the nearest hospital or call 911. Make sure the treating care team knows that the person has received a dose of this medication. You will receive additional instructions on what to do during and after use of this medication before an emergency occurs.  What side effects may I notice from receiving this medication? Side effects that you should report to your care team as soon as possible: Allergic reactions--skin rash, itching, hives, swelling of the face, lips, tongue, or throat Side effects that usually do not require medical attention (report these to your care team if they continue or are bothersome): Constipation Dryness or irritation inside the nose Headache Increase in blood pressure Muscle spasms Stuffy nose Toothache This list may not describe all possible side effects. Call your doctor for medical advice about side effects. You may report side effects to FDA at 1-800-FDA-1088.  Where should I keep my medication? Because this is an emergency medication, you should keep it with you at all times.  Keep out of the reach of children and pets. Store between 20 and 25 degrees C (68 and 77 degrees F). Do not freeze. Throw away any unused medication after the expiration date. Keep in original box until ready to  use.  NOTE: This sheet is a summary. It may not cover all possible information. If you have questions about this medicine, talk to your doctor, pharmacist, or health care provider.   2023 [employer]vier/Gold Standard (2021-02-23 00:00:00)  ____________________________________________________________________________________________   

## 2022-10-02 NOTE — Progress Notes (Signed)
PROVIDER NOTE: Information contained herein reflects review and annotations entered in association with encounter. Interpretation of such information and data should be left to medically-trained personnel. Information provided to patient can be located elsewhere in the medical record under "Patient Instructions". Document created using STT-dictation technology, any transcriptional errors that may result from process are unintentional.    Patient: Jacqueline Thornton  Service Category: E/M  Provider: Gaspar Cola, MD  DOB: [birth year]  DOS: 10/02/2022  Referring Provider: Perrin Maltese, MD  MRN: [id number]  Specialty: Interventional Pain Management  PCP: Perrin Maltese, MD  Type: Established Patient  Setting: Ambulatory outpatient    Location: Office  Delivery: Face-to-face     HPI  [name], a 72 y.o. year old female, is here today because of her Chronic pain syndrome [G89.4]. Jacqueline Thornton's primary complain today is Back Pain (lower)  Pertinent problems: Jacqueline Thornton has Chronic pain syndrome; Headache, migraine; Chronic low back pain (Bilateral) (R>L) w/o sciatica; Spondylosis of lumbar spine; Lumbar annular disc tear (L4-5); Discogenic low back pain (L3-4 and L4-5); Lumbar facet hypertrophy; Lumbar facet syndrome (Bilateral) (R>L); Chronic neck pain (Left); Cervical spondylosis; Hx of cervical spine surgery; Cervical spinal fusion (C6-7 interbody fusion); Chronic sacroiliac joint pain (Bilateral) (L>R); Chronic lumbar radicular pain (Left); Trochanteric bursitis of hip (Right); Chronic hip pain (Bilateral) (L>R); Neurogenic pain; Myofascial pain; Osteoarthritis of hip (Left); Arthrodesis status; DDD (degenerative disc disease), lumbar; Myalgia; Trochanteric bursitis of hip (Bilateral) (L>R); Spondylosis without myelopathy or radiculopathy, lumbosacral region; Spondylosis without myelopathy or radiculopathy, sacral and sacrococcygeal region; Other specified dorsopathies, sacral and  sacrococcygeal region; Age-related osteoporosis without current pathological fracture; Bone island of right femur; Inflammatory spondylopathy of lumbar region; Trigger point with back pain (Right); Fibromyalgia; DDD (degenerative disc disease), cervical; Spondylolisthesis of lumbar region; History of lumbar spinal fusion (Dr. Erline Levine) (12/11/2018); Failed back surgical syndrome; Unilateral occipital headache (Left); Cervicogenic headache (Left); Chronic hip pain (Left); Spondylolisthesis at L5-S1 level; Spondylosis of lumbosacral region without myelopathy or radiculopathy; Epidural fibrosis; Enthesopathy of hip region (Left); Chronic sacroiliac joint pain (Left); Enthesopathy of sacroiliac joint (Left); Somatic dysfunction of sacroiliac joint (Left); Herniated cervical disc without myelopathy; Chronic sacroiliac joint pain (Right); Osteoarthritis of sacroiliac joint (Right) ([place]); Somatic dysfunction of sacroiliac joint (Right); Chronic hip pain (Right); Neck pain; Herniated nucleus pulposus, C3-4; Prolapsed cervical intervertebral disc; Cervical spinal stenosis; Radiculopathy, cervical region; Lumbar central spinal stenosis, 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 Pain Assessment: Severity of Chronic pain is reported as a 5 /10. Location: Back Lower/pain radiaities down to her buttock. Onset: More than a month ago. Quality: Aching, Constant, Throbbing, Stabbing, Squeezing. Timing: Constant. Modifying factor(s): Meds and laying down. Vitals:  height is 5' (1.524 m) and weight is 142 lb (64.4 kg). Her temperature is 97.3 F (36.3 C)  (abnormal). Her blood pressure is 128/72 and her pulse is 77. Her oxygen saturation is 98%.  BMI: Estimated body mass index is 27.73 kg/m as calculated from the following:   Height as of this encounter: 5' (1.524 m).   Weight as of this encounter: 142 lb (64.4 kg). Last encounter: 07/08/2022. Last procedure: 02/07/2022.  Reason for encounter: medication management.  The patient  indicates doing well with the current medication regimen. No adverse reactions or side effects reported to the medications.  Jacqueline Thornton comes in today accompanied by her husband.  She is clearly depressed and tearful as she has not attained the results as she expected from her prior lumbar surgeries.  Today she comes in indicating that her primary area of pain is that of the lower back (Bilateral) (R>L).  She describes this pain as being constant.  She indicates having had her back surgery on 04/30/2022 and having been told by her surgeon that she can come back to have the facet injections.  She also refers having had a recent CT and MRIs of the lumbar spine.  She describes her secondary area pain is that of the lower extremities (Bilateral) (R>L).  This particular pain is now intermittent.  The case of the right side the pain goes down to the posterior and lateral aspect of the leg to the calf.  She denies any pain into her foot.  She describes the lower extremity pain to be associated with nightly cramps.  In the case of the left lower extremity the pain runs down the back of the leg but it is primarily and worse in the area of the buttocks.  She describes that for the past month or so she has been having problems with her left leg.  She describes that her foot does not seem to be picking up as it should and it is catching with the floor and has led to several falls.  Today I went ahead and reviewed her lab work looking for possible causes of the cramps.  She does have evidence of chronic kidney disease and some metabolic panel  abnormalities they usually go along with that however on her last set of labs they seem to be relatively within normal limits.  Physical exam today demonstrates the patient to have pain on hyperextension or rotation of the lumbar spine compatible with lumbar facet disease.  This was concordant with the results of the [facility] maneuver suggesting bilateral facet problems.  Today I have reviewed the operative report from date of service 04/30/2022 from [facility].  The surgeon for the procedure was Dr. Pieter Partridge C. Dawley, DO.  The preoperative diagnosis was "L4-5 degenerative spondylolisthesis, mobile, with severe stenosis of bilateral radiculopathy"  PROCEDURE: Minimally invasive L 4-5 bilateral decompression and transforaminal lumbar interbody fusion and arthrodesis, right sided approach Bilateral L4, L5 nonsegmental pedicle screw instrumentation, K2 M [place] 7.5 x 50 mm bilateral L4, 7.5 x 50 mm bilaterally at L5 Placement of anterior biomechanical device, K2 M titanium [place] 10 x 22 interbody device Intraoperative use of autograft, same incision Intraoperative use of allograft  Intraoperative use of proteos Intraoperative use of fluoroscopy, greater than 1 hour Intraoperative use of microscope, for microdissection  Based on the patient's signs and symptoms as well as physical exam today, I have offered the patient a diagnostic bilateral lumbar facet block.  Me in addition, I have ordered a nerve conduction test of the lower extremities to evaluate what appears to be a left-sided foot drop (L5 radiculopathy).  Review of the lumbar CT done on 08/22/2022 indicates a probable developing interbody arthrodesis at L5-S1; adjacent segment disease at L3-4; with borderline to mild facet hypertrophy at L3-4, and moderate facet hypertrophy at L5-S1.  RTCB: 01/09/2023   Pharmacotherapy Assessment  Analgesic: Oxycodone/APAP 7.5/325, 1 tab PO q 5 times a day. (56.25 mg/day of  oxycodone) MME/day: 56.25 mg/day.  Monitoring: [place] PMP: PDMP reviewed during this encounter.       Pharmacotherapy: No side-effects or adverse reactions reported. Compliance: No problems identified. Effectiveness: Clinically acceptable.  Chauncey Fischer, RN  10/02/2022  8:56 AM  Sign when Signing Visit Nursing Pain Medication Assessment:  Safety precautions to be maintained throughout the outpatient stay will include: orient to surroundings, keep bed in low position, maintain call bell within reach at all times, provide assistance with transfer out of bed and ambulation.  Medication Inspection Compliance: Pill count conducted under aseptic conditions, in front of the patient. Neither the pills nor the bottle was removed from the patient's sight at any time. Once count was completed pills were immediately returned to the patient in their original bottle.  Medication: Oxycodone/APAP Pill/Patch Count:  37 of 150 pills remain Pill/Patch Appearance: Markings consistent with prescribed medication Bottle Appearance: Standard pharmacy container. Clearly labeled. Filled Date: 3 / 62 / 2023 Last Medication intake:  TodaySafety precautions to be maintained throughout the outpatient stay will include: orient to surroundings, keep bed in low position, maintain call bell within reach at all times, provide assistance with transfer out of bed and ambulation.     No results found for: "CBDTHCR" No results found for: "D8THCCBX" No results found for: "D9THCCBX"  UDS:  Summary  Date Value Ref Range Status  12/19/2021 Note  Final    Comment:    ==================================================================== [employer] Select 13 (MW) ==================================================================== Test                             Result       Flag       Units  Drug Present and Declared for Prescription Verification   Oxycodone                      >8772        EXPECTED   ng/mg creat   Oxymorphone                     1343         EXPECTED   ng/mg creat   Noroxycodone                   >8772        EXPECTED   ng/mg creat   Noroxymorphone                 446          EXPECTED   ng/mg creat    Sources of oxycodone are scheduled prescription medications.    Oxymorphone, noroxycodone, and noroxymorphone are expected    metabolites of oxycodone. Oxymorphone is also available as a    scheduled prescription medication.  ==================================================================== Test                      Result    Flag   Units      Ref Range   Creatinine              114              mg/dL      >=20 ==================================================================== Declared Medications:  The flagging and interpretation on this report are based on the  following declared medications.  Unexpected results may arise from  inaccuracies in the declared medications.   **Note: The testing scope of this panel includes these medications:  Oxycodone (Percocet)   **Note: The testing scope of this panel does not include the  following reported medications:   Acetaminophen (Percocet)  Atorvastatin  Calcium  Cyclobenzaprine (Flexeril)  Duloxetine (Cymbalta)  Enalapril (Vasotec)  Estradiol (Estrace)  Gabapentin (Neurontin)  Ibandronate (Boniva)  Melatonin  Multivitamin  Omeprazole  Spironolactone  Vitamin B12 ==================================================================== For clinical consultation, please call [phone]. ====================================================================       ROS  Constitutional: Denies any fever or chills Gastrointestinal: No reported hemesis, hematochezia, vomiting, or acute GI distress Musculoskeletal: Denies any acute onset joint swelling, redness, loss of ROM, or weakness Neurological: No reported episodes of acute onset apraxia, aphasia, dysarthria, agnosia, amnesia, paralysis, loss of coordination, or loss of  consciousness  Medication Review  Calcium-Vitamin D-Vitamin K, Cyanocobalamin, DULoxetine, Magnesium, Melatonin, atorvastatin, docusate sodium, enalapril, estradiol, furosemide, gabapentin, methocarbamol, multivitamin with minerals, naloxone, omeprazole, oxyCODONE-acetaminophen, and spironolactone  History Review  Allergy: Jacqueline Thornton has No Known Allergies. Drug: Jacqueline Thornton  reports no history of drug use. Alcohol:  reports no history of alcohol use. Tobacco:  reports that she quit smoking about 19 years ago. Her smoking use included cigarettes. She has never used smokeless tobacco. Social: Jacqueline Thornton  reports that she quit smoking about 19 years ago. Her smoking use included cigarettes. She has never used smokeless tobacco. She reports that she does not drink alcohol and does not use drugs. Medical:  has a past medical history of Absolute anemia (04/11/2015), Acute postoperative pain (08/07/2017), Angina pectoris, Anxiety, Arthritis, Arthropathy of sacroiliac joint (04/11/2015), Atypical face pain (04/24/2015), Back pain, CAD (coronary artery disease), Cancer, Chronic back pain, Constipation, Depression, Fibromyalgia, GERD (gastroesophageal reflux disease), 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) (04/11/2015), H/O: hysterectomy (1979), Heart murmur, Hyperlipidemia, Hypertension, Low back pain (04/06/2015), Lumbar radicular pain (04/18/2015), Migraine, Narrowing of intervertebral disc space (04/11/2015), Sacroiliac joint pain (04/11/2015), and Spine disorder. Surgical: Jacqueline Thornton  has a past surgical history that includes Cholecystectomy; Abdominal hysterectomy; Neck surgery; Colonoscopy with propofol (N/A, 07/19/2015); caract surger; Laparoscopic salpingo oophorectomy (Bilateral, 03/03/2018); Cystoscopy (03/03/2018); Tonsillectomy; Appendectomy; Tumor removal; Anterior lumbar fusion (N/A, 12/11/2018); Abdominal exposure (N/A, 12/11/2018); Anterior cervical decomp/discectomy fusion (N/A, 10/06/2020);  Toe Fusion (Right); Esophagogastroduodenoscopy (egd) with propofol (N/A, 09/11/2021); Colonoscopy with propofol (N/A, 09/11/2021); Colonoscopy with propofol (N/A, 03/05/2022); and Transforaminal lumbar interbody fusion w/ mis 1 level (Right, 04/30/2022). Family: family history includes Colon cancer in her maternal grandmother and mother; Diabetes in her father and mother; Hypertension in her sister; Stroke in her mother.  Laboratory Chemistry Profile   Renal Lab Results  Component Value Date   BUN 9 04/24/2022   CREATININE 0.95 04/24/2022   BCR 8 (L) 12/16/2019   GFRAA 47 (L) 12/16/2019   GFRNONAA >60 04/24/2022    Hepatic Lab Results  Component Value Date   AST 15 12/16/2019   ALT 14 03/08/2018   ALBUMIN 4.0 12/16/2019   ALKPHOS 60 12/16/2019    Electrolytes Lab Results  Component Value Date   NA 136 04/24/2022   K 3.6 04/24/2022   CL 101 04/24/2022   CALCIUM 8.7 (L) 04/24/2022   MG 2.4 (H) 12/16/2019    Bone Lab Results  Component Value Date   25OHVITD1 53 12/16/2019   25OHVITD2 <1.0 12/16/2019   25OHVITD3 53 12/16/2019    Inflammation (CRP: Acute Phase) (ESR: Chronic Phase) Lab Results  Component Value Date   CRP 4 12/16/2019   ESRSEDRATE 6 12/16/2019         Note: Above Lab results reviewed.  Recent Imaging Review  CT LUMBAR SPINE WO CONTRAST CLINICAL DATA:  72 year old female with prior lumbar spine surgery.  EXAM: CT LUMBAR SPINE WITHOUT CONTRAST  TECHNIQUE: Multidetector CT imaging of the lumbar spine was performed without intravenous contrast administration. Multiplanar CT image reconstructions were also generated.  RADIATION DOSE REDUCTION: This exam was performed according to the departmental dose-optimization program which includes automated exposure control, adjustment of the mA and/or kV according to patient size and/or use of iterative reconstruction technique.  COMPARISON:  Intraoperative lumbar spine images [id number]. Preoperative lumbar CT  myelogram 04/16/2011.  FINDINGS: Segmentation: Normal.  Alignment: Stable lumbar lordosis since 2012. Mild dextroconvex lower lumbar spine curvature/scoliosis is new since that time. No significant spondylolisthesis.  Vertebrae: Postoperative details are below. There is a degree of generalized osteopenia. Visible lower thoracic levels and ribs are intact. Overall maintained lumbar vertebral body height. No acute osseous abnormality identified. Intact visible sacrum and SI joints. Partially visible probably benign sclerosis along the anterior right acetabulum series 3, image 156, such as due to benign bone island. This is unchanged compared to a 05/31/2021 CT Abdomen and Pelvis.  Paraspinal and other soft tissues: Postoperative changes to the posterior lower paraspinal soft tissues with no adverse features. Visible lung bases are clear. Calcified aortic atherosclerosis. Normal caliber abdominal aorta. Negative visible other noncontrast abdominal viscera.  Disc levels: Visible lower thoracic levels through L1-L2 are normal for age.  L2-L3: Mild disc space loss. Circumferential disc bulge. No significant stenosis.  L3-L4: Mild to moderate disc space loss and circumferential disc bulge. Borderline to mild facet hypertrophy. No significant stenosis.  L4-L5: Sequelae of decompression and fusion. Bilateral pedicle screws appear intact with no adverse features. Interbody implant. Small volume interbody vacuum phenomena. Mild endplate subsidence, mostly into the L5 superior endplate. No arthrodesis at this time. No evidence of stenosis.  L5-S1: Superimposed anterior interbody fusion hardware at this level. Subtle lucency along the L5 cortical screw component on series 5, image 33. S1 screws with no adverse features. And moderate interbody calcification at this level. Superimposed residual endplate spurring and moderate facet hypertrophy. No convincing spinal or foraminal  stenosis.  S1 and sacral spina bifida occulta, normal variant.  IMPRESSION: 1. Sequelae of fusion at L4-L5 and L5-S1. Posterior decompression at the former. No significant hardware abnormality at this time. Probable developing interbody arthrodesis at L5-S1. No arthrodesis yet at L4-L5. No evidence of stenosis at either level. 2. Mild adjacent segment disease at L3-L4 with no significant stenosis by CT. 3.  Aortic Atherosclerosis (ICD10-I70.0).  Electronically Signed   By: Genevie Ann M.D.   On: 08/26/2022 08:25 Note: Reviewed        Physical Exam  General appearance: Well nourished, well developed, and well hydrated. In no apparent acute distress Mental status: Alert, oriented x 3 (person, place, & time)       Respiratory: No evidence of acute respiratory distress Eyes: PERLA Vitals: BP 128/72   Pulse 77   Temp (!) 97.3 F (36.3 C)   Ht 5' (1.524 m)   Wt 142 lb (64.4 kg)   SpO2 98%   BMI 27.73 kg/m  BMI: Estimated body mass index is 27.73 kg/m as calculated from the following:   Height as of this encounter: 5' (1.524 m).   Weight as of this encounter: 142 lb (64.4 kg). Ideal: Ideal body weight: 45.5 kg (100 lb 4.9 oz) Adjusted ideal body weight: 53.1 kg (116 lb 15.8 oz)  Assessment   Diagnosis Status  1. Chronic pain syndrome  2. Failed back surgical syndrome   3. DDD (degenerative disc disease), lumbar   4. Lumbar facet syndrome (Bilateral) (R>L)   5. Chronic sacroiliac joint pain (Bilateral) (L>R)   6. Chronic hip pain (Bilateral) (L>R)   7. Trochanteric bursitis of hip (Bilateral) (L>R)   8. Chronic neck pain (Left)   9. Pharmacologic therapy   10. Chronic use of opiate for therapeutic purpose   11. Encounter for medication management   12. Encounter for chronic pain management   13. Muscle cramps at night (lower extremity)   14. Chronic lower extremity pain (Bilateral) (R>L)   15. Foot drop (Left)   16. Lumbar facet hypertrophy   17. Spondylosis without  myelopathy or radiculopathy, lumbosacral region   18. Chronic low back pain (Bilateral) (R>L) w/o sciatica    Controlled Controlled Controlled   Updated Problems: Problem  Muscle cramps at night (lower extremity)  Chronic lower extremity pain (Bilateral) (R>L)   Nonradicular, intermittent   Foot drop (Left)    Plan of Care  Problem-specific:  No problem-specific Assessment & Plan notes found for this encounter.  Jacqueline Thornton has a current medication list which includes the following long-term medication(s): atorvastatin, viactiv calcium plus d, cyanocobalamin, duloxetine, enalapril, furosemide, [START ON 10/11/2022] oxycodone-acetaminophen, [START ON 11/10/2022] oxycodone-acetaminophen, [START ON 12/10/2022] oxycodone-acetaminophen, spironolactone, omeprazole, and oxycodone-acetaminophen.  Pharmacotherapy (Medications Ordered): Meds ordered this encounter  Medications   oxyCODONE-acetaminophen (PERCOCET) 7.5-325 MG tablet    Sig: Take 1 tablet by mouth 5 (five) times daily. Must last 30 days    Dispense:  150 tablet    Refill:  0    DO NOT: delete (not duplicate); no partial-fill (will deny script to complete), no refill request (F/U required). DISPENSE: 1 day early if closed on fill date. WARN: No CNS-depressants within 8 hrs of med.   oxyCODONE-acetaminophen (PERCOCET) 7.5-325 MG tablet    Sig: Take 1 tablet by mouth 5 (five) times daily. Must last 30 days    Dispense:  150 tablet    Refill:  0    DO NOT: delete (not duplicate); no partial-fill (will deny script to complete), no refill request (F/U required). DISPENSE: 1 day early if closed on fill date. WARN: No CNS-depressants within 8 hrs of med.   oxyCODONE-acetaminophen (PERCOCET) 7.5-325 MG tablet    Sig: Take 1 tablet by mouth 5 (five) times daily. Must last 30 days    Dispense:  150 tablet    Refill:  0    DO NOT: delete (not duplicate); no partial-fill (will deny script to complete), no refill request (F/U  required). DISPENSE: 1 day early if closed on fill date. WARN: No CNS-depressants within 8 hrs of med.   naloxone (NARCAN) nasal spray 4 mg/0.1 mL    Sig: Place 1 spray into the nose as needed for up to 365 doses (for opioid-induced respiratory depresssion). In case of emergency (overdose), spray once into each nostril. If no response within 3 minutes, repeat application and call [phone].    Dispense:  1 each    Refill:  0    Instruct patient in proper use of device.   Orders:  Orders Placed This Encounter  Procedures   LUMBAR FACET(MEDIAL BRANCH NERVE BLOCK) MBNB    Diagnosis: Lumbar Facet Syndrome (M47.816); Lumbosacral Facet Syndrome (M47.817); Lumbar Facet Joint Pain (M54.59) Medical Necessity Statement: 1.Severe chronic axial low back pain causing functional impairment documented by ongoing pain scale assessments. 2.Pain present for longer than 3 months (Chronic) documented to  have failed noninvasive conservative therapies. 3.Absence of untreated radiculopathy. 4.There is no radiological evidence of untreated fractures, tumor, infection, or deformity.  Physical Examination Findings: Positive Kemp Maneuver: (Y)  Positive Lumbar Hyperextension-Rotation provocative test: (Y)    Standing Status:   Future    Standing Expiration Date:   01/01/2023    Scheduling Instructions:     Procedure: Lumbar facet Block     Type: Medial Branch Block     Side: Bilateral     Purpose: Diagnostic Radiologic Mapping     Level(s): L4-5, L5-S1, and TBD by Fluoroscopic Mapping Facets (L3, L4, L5, S1, and TBD Medial Branch)     Sedation: With Sedation.     Timeframe: ASAP    Order Specific Question:   Where will this procedure be performed?    Answer:   [facility]   NCV with EMG(electromyography)    Bilateral testing requested.    Standing Status:   Future    Standing Expiration Date:   12/02/2022    Scheduling Instructions:     Please refer this patient to [facility] for Nerve  Conduction testing of the lower extremities. (EMG & PNCV)    Order Specific Question:   Where should this test be performed?    Answer:   other    Order Specific Question:   Release to patient    Answer:   Immediate   Follow-up plan:   Return for (ECT): (B) L-FCT Blk.      Interventional Therapies  Risk Factors  Considerations:      Planned  Pending:   Diagnostic/therapeutic midline caudal ESI #2 + sacral interspinous ligament MNB/TPI #1    Under consideration:   Diagnostic caudal diagnostic epidurogram Diagnostic CESI  Diagnostic bilateral cervical facet block  Possible bilateral cervical facet RFA  Diagnostic left L4-5 LESI  Diagnostic left S1 SNRB    Completed:   Diagnostic bilateral L4 TFESI x1 (01/08/2022) (100/25/0)  Therapeutic right Racz procedure x1 (08/17/2020) (04/09/84/85) (100% relief of the right LEP) Diagnostic caudal ESI x1 (07/11/2020) (100/100/0/0)  Palliative left L5-S1 LESI x2 (07/27/2015) (70/70/0/0)  Diagnostic left lumbar facet MBB x7 (02/07/2022) (20/20/0/0)  Palliative right lumbar facet MBB x10 (02/07/2022) (20/20/0/0)  Therapeutic right shoulder joint inj. x1 (02/29/2016) (90/80/30)  Therapeutic left IA hip injection x3 (06/13/2020) (100/100/30/>50)  Diagnostic right SI joint Blk x3 (06/17/2017) (100/90/80/80)  Diagnostic/therapeutic left SI joint Blk x4 (06/17/2017) (100/90/80/80)  Palliative bilateral trochanteric bursa inj. x1 (12/11/2016) (100/100/90/90)  Therapeutic right lumbar facet RFA x2 (01/11/2021) (100/100/70)  Therapeutic left lumbar facet RFA x2 (12/19/2020) (100/100/75/>75) Therapeutic right SI joint RFA x1 (08/07/2017) (100/100/80)  Therapeutic left SI joint RFA x1 (09/16/2017) (100/100/100)    Therapeutic  Palliative (PRN) options:   Palliative Racz procedure  Palliative bilateral SI joint block  Palliative right trochanteric bursa injection    Pharmacotherapy  Nonopioids transferred 05/24/2020: Vitamin B12, Flexeril, and Neurontin.        Recent Visits Date Type Provider Dept  07/08/22 Office Visit Milinda Pointer, MD [facility]  Showing recent visits within past 90 days and meeting all other requirements Today's Visits Date Type Provider Dept  10/02/22 Office Visit Milinda Pointer, MD [facility]  Showing today's visits and meeting all other requirements Future Appointments Date Type Provider Dept  10/22/22 Appointment Milinda Pointer, MD [facility]  Showing future appointments within next 90 days and meeting all other requirements  I discussed the assessment and treatment plan with the patient. The patient  was provided an opportunity to ask questions and all were answered. The patient agreed with the plan and demonstrated an understanding of the instructions.  Patient advised to call back or seek an in-person evaluation if the symptoms or condition worsens.  Duration of encounter: 30 minutes.  Total time on encounter, as per AMA guidelines included both the face-to-face and non-face-to-face time personally spent by the physician and/or other qualified health care professional(s) on the day of the encounter (includes time in activities that require the physician or other qualified health care professional and does not include time in activities normally performed by clinical staff). Physician's time may include the following activities when performed: Preparing to see the patient (e.g., pre-charting review of records, searching for previously ordered imaging, lab work, and nerve conduction tests) Review of prior analgesic pharmacotherapies. Reviewing PMP Interpreting ordered tests (e.g., lab work, imaging, nerve conduction tests) Performing post-procedure evaluations, including interpretation of diagnostic procedures Obtaining and/or reviewing separately obtained history Performing a medically appropriate examination and/or evaluation Counseling and educating the  patient/family/caregiver Ordering medications, tests, or procedures Referring and communicating with other health care professionals (when not separately reported) Documenting clinical information in the electronic or other health record Independently interpreting results (not separately reported) and communicating results to the patient/ family/caregiver Care coordination (not separately reported)  Note by: Gaspar Cola, MD Date: 10/02/2022; Time: 12:52 PM

## 2022-10-02 NOTE — Progress Notes (Signed)
Nursing Pain Medication Assessment:  Safety precautions to be maintained throughout the outpatient stay will include: orient to surroundings, keep bed in low position, maintain call bell within reach at all times, provide assistance with transfer out of bed and ambulation.  Medication Inspection Compliance: Pill count conducted under aseptic conditions, in front of the patient. Neither the pills nor the bottle was removed from the patient's sight at any time. Once count was completed pills were immediately returned to the patient in their original bottle.  Medication: Oxycodone/APAP Pill/Patch Count:  37 of 150 pills remain Pill/Patch Appearance: Markings consistent with prescribed medication Bottle Appearance: Standard pharmacy container. Clearly labeled. Filled Date: 3 / 103 / 2023 Last Medication intake:  TodaySafety precautions to be maintained throughout the outpatient stay will include: orient to surroundings, keep bed in low position, maintain call bell within reach at all times, provide assistance with transfer out of bed and ambulation.

## 2022-10-07 ENCOUNTER — Encounter: Payer: Medicare Other | Admitting: Pain Medicine

## 2022-10-21 ENCOUNTER — Ambulatory Visit (INDEPENDENT_AMBULATORY_CARE_PROVIDER_SITE_OTHER): Payer: PRIVATE HEALTH INSURANCE | Admitting: Internal Medicine

## 2022-10-21 ENCOUNTER — Encounter: Payer: Self-pay | Admitting: Internal Medicine

## 2022-10-21 VITALS — BP 118/72 | HR 91 | Ht 60.0 in | Wt 137.0 lb

## 2022-10-21 DIAGNOSIS — E782 Mixed hyperlipidemia: Secondary | ICD-10-CM | POA: Diagnosis not present

## 2022-10-21 DIAGNOSIS — J0110 Acute frontal sinusitis, unspecified: Secondary | ICD-10-CM

## 2022-10-21 DIAGNOSIS — G894 Chronic pain syndrome: Secondary | ICD-10-CM

## 2022-10-21 DIAGNOSIS — F411 Generalized anxiety disorder: Secondary | ICD-10-CM

## 2022-10-21 DIAGNOSIS — Z1231 Encounter for screening mammogram for malignant neoplasm of breast: Secondary | ICD-10-CM

## 2022-10-21 DIAGNOSIS — J301 Allergic rhinitis due to pollen: Secondary | ICD-10-CM

## 2022-10-21 MED ORDER — AMOXICILLIN-POT CLAVULANATE 875-125 MG PO TABS: 1.0000 | ORAL_TABLET | Freq: Two times a day (BID) | ORAL | 0 refills | Status: DC

## 2022-10-21 NOTE — Progress Notes (Signed)
Established Patient Office Visit  Subjective:  Patient ID: Jacqueline Thornton, female    DOB: 1951-05-13  Age: 72 y.o. MRN: [id number]  Chief Complaint  Patient presents with   Follow-up    Sinus congestion/headache    Patient comes in with 4-week history of head and sinus congestion.  Patient has history of allergic rhinitis and has been using her antihistamine as well as a nasal spray.  But she reports that she is getting worse with a lot of headache, congestion, postnasal drip and now a colored nasal discharge.  Patient also having some bilateral ear pain and pressure.  She is also having chills but no fever. Patient did a home COVID test which was negative.  She does not have any chest pain, shortness of breath, or productive cough. Patient has been busy with her orthopedic, spine surgeon, pain clinic. She missed her mammogram appointment and will request a new referral.  Patient will come back fasting for blood work.    No other concerns at this time.   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   Atypical face pain 04/24/2015   Back pain    CAD (coronary artery disease)    pt denies   Cancer    squamous cell- R temple    Chronic back pain    Constipation    Depression    Fibromyalgia    GERD (gastroesophageal reflux disease)    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) 04/11/2015   H/O: hysterectomy 1979   Heart murmur    Hyperlipidemia    Hypertension    Low back pain 04/06/2015   Lumbar radicular pain 04/18/2015   Migraine    Narrowing of intervertebral disc space 04/11/2015   Sacroiliac joint pain 04/11/2015   Spine disorder     Past Surgical History:  Procedure Laterality Date   ABDOMINAL EXPOSURE N/A 12/11/2018   Procedure: ABDOMINAL EXPOSURE;  Surgeon: Nada Libman, MD;  Location: [facility] OR;   Service: Vascular;  Laterality: N/A;   ABDOMINAL HYSTERECTOMY     ANTERIOR CERVICAL DECOMP/DISCECTOMY FUSION N/A 10/06/2020   Procedure: Cervical three -four Anterior cervical decompression/discectomy/fusion  with possible anchor c and exploration of fusion Cervical four to Six;  Surgeon: Maeola Harman, MD;  Location: [facility] OR;  Service: Neurosurgery;  Laterality: N/A;   ANTERIOR LUMBAR FUSION N/A 12/11/2018   Procedure: Lumbar five Sacral one Anterior lumbar interbody fusion;  Surgeon: Maeola Harman, MD;  Location: [facility] OR;  Service: Neurosurgery;  Laterality: N/A;   APPENDECTOMY     Age19   caract surger     02/24/17   CHOLECYSTECTOMY     COLONOSCOPY WITH PROPOFOL N/A 07/19/2015   Procedure: COLONOSCOPY WITH PROPOFOL;  Surgeon: Scot Jun, MD;  Location: [facility] ENDOSCOPY;  Service: Endoscopy;  Laterality: N/A;   COLONOSCOPY WITH PROPOFOL N/A 09/11/2021   Procedure: COLONOSCOPY WITH PROPOFOL;  Surgeon: Regis Bill, MD;  Location: [facility] ENDOSCOPY;  Service: Endoscopy;  Laterality: N/A;   COLONOSCOPY WITH PROPOFOL N/A 03/05/2022   Procedure: COLONOSCOPY WITH PROPOFOL;  Surgeon: Regis Bill, MD;  Location: [facility] ENDOSCOPY;  Service: Endoscopy;  Laterality: N/A;   CYSTOSCOPY  03/03/2018   Procedure: CYSTOSCOPY;  Surgeon: Conard Novak, MD;  Location: [facility] ORS;  Service: Gynecology;;   ESOPHAGOGASTRODUODENOSCOPY (EGD) WITH PROPOFOL N/A  09/11/2021   Procedure: ESOPHAGOGASTRODUODENOSCOPY (EGD) WITH PROPOFOL;  Surgeon: Regis Bill, MD;  Location: [facility] ENDOSCOPY;  Service: Endoscopy;  Laterality: N/A;   LAPAROSCOPIC SALPINGO OOPHERECTOMY Bilateral 03/03/2018   Procedure: LAPAROSCOPIC SALPINGO OOPHORECTOMY;  Surgeon: Conard Novak, MD;  Location: [facility] ORS;  Service: Gynecology;  Laterality: Bilateral;   NECK SURGERY     Age 50 and 26.   TOE FUSION Right    TONSILLECTOMY     Age 81   TRANSFORAMINAL LUMBAR INTERBODY FUSION W/ MIS 1 LEVEL Right 04/30/2022   Procedure:  Minimally Invasive  Decompression and  Transforaminal Lumbar Interbody Fusion Lumbar Four-Five;  Surgeon: Dawley, Alan Mulder, DO;  Location: [facility] OR;  Service: Neurosurgery;  Laterality: Right;  3C   TUMOR REMOVAL     Ovaries    Social History   Socioeconomic History   Marital status: Married    Spouse name: Not on file   Number of children: Not on file   Years of education: Not on file   Highest education level: Not on file  Occupational History   Not on file  Tobacco Use   Smoking status: Former    Types: Cigarettes    Quit date: 07/02/2003    Years since quitting: 19.3   Smokeless tobacco: Never  Vaping Use   Vaping Use: Never used  Substance and Sexual Activity   Alcohol use: No    Alcohol/week: 0.0 standard drinks of alcohol   Drug use: No   Sexual activity: Not Currently  Other Topics Concern   Not on file  Social History Narrative   Not on file   Social Determinants of Health   Financial Resource Strain: Low Risk  (06/17/2017)   Overall Financial Resource Strain (CARDIA)    Difficulty of Paying Living Expenses: Not hard at all  Food Insecurity: No Food Insecurity (02/28/2022)   Hunger Vital Sign    Worried About Running Out of Food in the Last Year: Never true    Ran Out of Food in the Last Year: Never true  Transportation Needs: No Transportation Needs (02/28/2022)   PRAPARE - Administrator, Civil Service (Medical): No    Lack of Transportation (Non-Medical): No  Physical Activity: Unknown (06/17/2017)   Exercise Vital Sign    Days of Exercise per Week: Patient declined    Minutes of Exercise per Session: Patient declined  Stress: No Stress Concern Present (06/17/2017)   [employer] of Occupational Health - Occupational Stress Questionnaire    Feeling of Stress : Not at all  Social Connections: Unknown (06/17/2017)   Social Connection and Isolation Panel [NHANES]    Frequency of Communication with Friends and Family: Patient declined     Frequency of Social Gatherings with Friends and Family: Patient declined    Attends Religious Services: Patient declined    Database administrator or Organizations: Patient declined    Attends Banker Meetings: Patient declined    Marital Status: Patient declined  Intimate Partner Violence: Unknown (06/17/2017)   Humiliation, Afraid, Rape, and Kick questionnaire    Fear of Current or Ex-Partner: Patient declined    Emotionally Abused: Patient declined    Physically Abused: Patient declined    Sexually Abused: Patient declined    Family History  Problem Relation Age of Onset   Diabetes Father    Hypertension Sister    Diabetes Mother    Stroke Mother    Colon cancer Mother    Colon cancer Maternal Grandmother  Ovarian cancer Neg Hx    Breast cancer Neg Hx     No Known Allergies  Review of Systems  Constitutional:  Positive for chills, diaphoresis and malaise/fatigue. Negative for fever and weight loss.  HENT:  Positive for congestion, ear pain, sinus pain and sore throat. Negative for ear discharge and nosebleeds.   Eyes: Negative.   Respiratory:  Negative for cough, sputum production, shortness of breath and wheezing.   Cardiovascular: Negative.   Gastrointestinal: Negative.   Genitourinary: Negative.   Musculoskeletal:  Positive for back pain, joint pain and myalgias. Negative for falls.  Neurological:  Positive for headaches. Negative for dizziness, tremors, focal weakness and weakness.  Psychiatric/Behavioral:  The patient is nervous/anxious.        Objective:   BP 118/72   Pulse 91   Ht 5' (1.524 m)   Wt 137 lb (62.1 kg)   SpO2 97%   BMI 26.76 kg/m   Vitals:   10/21/22 1448  BP: 118/72  Pulse: 91  Height: 5' (1.524 m)  Weight: 137 lb (62.1 kg)  SpO2: 97%  BMI (Calculated): 26.76    Physical Exam Vitals and nursing note reviewed.  Constitutional:      Appearance: Normal appearance.  HENT:     Head: Normocephalic.     Right Ear: A  middle ear effusion is present. Tympanic membrane is injected.     Left Ear: A middle ear effusion is present. Tympanic membrane is injected.     Nose: Congestion present.     Mouth/Throat:     Pharynx: Oropharynx is clear. No oropharyngeal exudate or posterior oropharyngeal erythema.  Cardiovascular:     Rate and Rhythm: Normal rate and regular rhythm.     Pulses: Normal pulses.     Heart sounds: Normal heart sounds.  Pulmonary:     Effort: Pulmonary effort is normal. No respiratory distress.     Breath sounds: Normal breath sounds. No stridor. No wheezing or rales.  Abdominal:     General: Bowel sounds are normal.     Palpations: Abdomen is soft.  Musculoskeletal:        General: Normal range of motion.     Cervical back: Normal range of motion and neck supple.  Skin:    General: Skin is warm.  Neurological:     General: No focal deficit present.     Mental Status: She is alert and oriented to person, place, and time.  Psychiatric:        Mood and Affect: Mood normal.        Behavior: Behavior normal.      No results found for any visits on 10/21/22.  No results found for this or any previous visit (from the past 2160 hour(s)).    Assessment & Plan:  Patient advised to continue taking her antihistamine as well as Flonase nasal spray.  Prescription sent for p.o. Augmentin for 1 week.   Patient will come back fasting for blood work.  Mammogram order sent again. Problem List Items Addressed This Visit     Chronic pain syndrome (Chronic)   Hyperlipidemia   Anxiety, generalized   Seasonal allergic rhinitis due to pollen   Acute non-recurrent frontal sinusitis - Primary   Relevant Medications   amoxicillin-clavulanate (AUGMENTIN) 875-125 MG tablet   Other Visit Diagnoses     Encounter for screening mammogram for malignant neoplasm of breast       Relevant Orders   MM 3D SCREENING MAMMOGRAM BILATERAL BREAST  Return in about 10 days (around 10/31/2022).   Total  time spent: 30 minutes  Margaretann Loveless, MD  10/21/2022

## 2022-10-22 ENCOUNTER — Ambulatory Visit: Payer: Medicare Other | Attending: Pain Medicine | Admitting: Pain Medicine

## 2022-10-22 DIAGNOSIS — M5459 Other low back pain: Secondary | ICD-10-CM | POA: Insufficient documentation

## 2022-10-22 DIAGNOSIS — Z91199 Patient's noncompliance with other medical treatment and regimen due to unspecified reason: Secondary | ICD-10-CM

## 2022-10-22 DIAGNOSIS — M4316 Spondylolisthesis, lumbar region: Secondary | ICD-10-CM | POA: Insufficient documentation

## 2022-10-22 NOTE — Progress Notes (Signed)
(  10/22/2022) NO-SHOW (and no call to cancel) to procedure visit (bilateral Lumbar Facet, Medial Branch Block(s) L8  R11 (1st of 2024)).

## 2022-10-22 NOTE — Patient Instructions (Signed)

## 2022-10-31 ENCOUNTER — Ambulatory Visit: Payer: PRIVATE HEALTH INSURANCE | Admitting: Internal Medicine

## 2022-11-05 ENCOUNTER — Ambulatory Visit: Payer: PRIVATE HEALTH INSURANCE | Admitting: Internal Medicine

## 2022-11-07 ENCOUNTER — Ambulatory Visit: Payer: PRIVATE HEALTH INSURANCE | Admitting: Internal Medicine

## 2022-11-08 ENCOUNTER — Telehealth: Payer: Self-pay | Admitting: Internal Medicine

## 2022-11-08 ENCOUNTER — Ambulatory Visit: Payer: PRIVATE HEALTH INSURANCE

## 2022-11-08 ENCOUNTER — Telehealth: Payer: Self-pay

## 2022-11-08 NOTE — Telephone Encounter (Signed)
Patient is coming by this afternoon to get swabbed for flu

## 2022-11-08 NOTE — Telephone Encounter (Signed)
Pt called the front desk stating her husband tested positive for the flu, she wanted to make an appointment for a sinus infection. I didn't know if you wanted to just send in tamiflu for her? Please advise

## 2022-12-16 NOTE — Progress Notes (Unsigned)
PROVIDER NOTE: Interpretation of information contained herein should be left to medically-trained personnel. Specific patient instructions are provided elsewhere under "Patient Instructions" section of medical record. This document was created in part using STT-dictation technology, any transcriptional errors that may result from this process are unintentional.  Patient: Jacqueline Thornton Type: Established DOB: July 28, 1950 MRN: [id number] PCP: Jacqueline Loveless, MD  Service: Procedure DOS: 12/17/2022 Setting: Ambulatory Location: Ambulatory outpatient facility Delivery: Face-to-face Provider: Oswaldo Done, MD Specialty: Interventional Pain Management Specialty designation: 09 Location: Outpatient facility Ref. Prov.: Jacqueline Loveless, MD       Interventional Therapy   Procedure: Lumbar Medial Branch Block(s) L8  R11 (1st of 2024)  (Pedicular screws hardware injection.) Laterality: Bilateral  Level: L3, L4, L5, and S1 Medial Branch Level(s). Injecting these levels blocks the L4-5 and L5-S1 lumbar facet joints. (Two levels on left and one on right) Imaging: Fluoroscopic guidance         Anesthesia: Local anesthesia (1-2% Lidocaine) Anxiolysis: IV Versed 3.0 mg Sedation: Moderate Sedation Fentanyl 1 mL (50 mcg) DOS: 12/17/2022 Performed by: Jacqueline Done, MD  Primary Purpose: Diagnostic/Therapeutic Indications: Low back pain severe enough to impact quality of life or function. 1. Lumbar facet syndrome (Bilateral) (R>L)   2. Lumbar facet hypertrophy   3. Lumbar facet joint pain   4. Spondylosis without myelopathy or radiculopathy, lumbosacral region   5. DDD (degenerative disc disease), lumbar   6. Chronic low back pain (Bilateral) (R>L) w/o sciatica    NAS-11 Pain score:   Pre-procedure: 5 /10   Post-procedure: 0-No pain/10   Note: Intraoperative fluoroscopic mapping showed pain on left to originate at level of the two pedicle screws, while on the right it seemed to include  only the right L5 pedicle screw.  Consider: SCS trial and implant.    Position / Prep / Materials:  Position: Prone  Prep solution: DuraPrep (Iodine Povacrylex [0.7% available iodine] and Isopropyl Alcohol, 74% w/w) Area Prepped: Posterolateral Lumbosacral Spine (Wide prep: From the lower border of the scapula down to the end of the tailbone and from flank to flank.)  Materials:  Tray: Block Needle(s):  Type: Spinal  Gauge (G): 22  Length: 3.5-in Qty: 4      Pre-op H&P Assessment:  Jacqueline Thornton is a 72 y.o. (year old), female patient, seen today for interventional treatment. She  has a past surgical history that includes Cholecystectomy; Abdominal hysterectomy; Neck surgery; Colonoscopy with propofol (N/A, 07/19/2015); caract surger; Laparoscopic salpingo oophorectomy (Bilateral, 03/03/2018); Cystoscopy (03/03/2018); Tonsillectomy; Appendectomy; Tumor removal; Anterior lumbar fusion (N/A, 12/11/2018); Abdominal exposure (N/A, 12/11/2018); Anterior cervical decomp/discectomy fusion (N/A, 10/06/2020); Toe Fusion (Right); Esophagogastroduodenoscopy (egd) with propofol (N/A, 09/11/2021); Colonoscopy with propofol (N/A, 09/11/2021); Colonoscopy with propofol (N/A, 03/05/2022); and Transforaminal lumbar interbody fusion w/ mis 1 level (Right, 04/30/2022). Jacqueline Thornton has a current medication list which includes the following prescription(s): atorvastatin, viactiv calcium plus d, cyanocobalamin, docusate sodium, duloxetine, enalapril, estradiol, furosemide, gabapentin, magnesium, melatonin, methocarbamol, multivitamin with minerals, naloxone, omeprazole, oxycodone-acetaminophen, oxycodone-acetaminophen, oxycodone-acetaminophen, spironolactone, and amoxicillin-clavulanate, and the following Facility-Administered Medications: fentanyl. Her primarily concern today is the Back Pain (lower)  Initial Vital Signs:  Pulse/HCG Rate: 88ECG Heart Rate: 83 Temp: (!) 96.8 F (36 C) Resp: 16 BP: (!) 91/51 SpO2:  97 %  BMI: Estimated body mass index is 27.15 kg/m as calculated from the following:   Height as of this encounter: 5' (1.524 m).   Weight as of this encounter: 139 lb (63 kg).  Risk Assessment: Allergies:  Reviewed. She has No Known Allergies.  Allergy Precautions: None required Coagulopathies: Reviewed. None identified.  Blood-thinner therapy: None at this time Active Infection(s): Reviewed. None identified. Jacqueline Thornton is afebrile  Site Confirmation: Jacqueline Thornton was asked to confirm the procedure and laterality before marking the site Procedure checklist: Completed Consent: Before the procedure and under the influence of no sedative(s), amnesic(s), or anxiolytics, the patient was informed of the treatment options, risks and possible complications. To fulfill our ethical and legal obligations, as recommended by the American Medical Association's Code of Ethics, I have informed the patient of my clinical impression; the nature and purpose of the treatment or procedure; the risks, benefits, and possible complications of the intervention; the alternatives, including doing nothing; the risk(s) and benefit(s) of the alternative treatment(s) or procedure(s); and the risk(s) and benefit(s) of doing nothing. The patient was provided information about the general risks and possible complications associated with the procedure. These may include, but are not limited to: failure to achieve desired goals, infection, bleeding, organ or nerve damage, allergic reactions, paralysis, and death. In addition, the patient was informed of those risks and complications associated to Spine-related procedures, such as failure to decrease pain; infection (i.e.: Meningitis, epidural or intraspinal abscess); bleeding (i.e.: epidural hematoma, subarachnoid hemorrhage, or any other type of intraspinal or peri-dural bleeding); organ or nerve damage (i.e.: Any type of peripheral nerve, nerve root, or spinal cord injury) with  subsequent damage to sensory, motor, and/or autonomic systems, resulting in permanent pain, numbness, and/or weakness of one or several areas of the body; allergic reactions; (i.e.: anaphylactic reaction); and/or death. Furthermore, the patient was informed of those risks and complications associated with the medications. These include, but are not limited to: allergic reactions (i.e.: anaphylactic or anaphylactoid reaction(s)); adrenal axis suppression; blood sugar elevation that in diabetics may result in ketoacidosis or comma; water retention that in patients with history of congestive heart failure may result in shortness of breath, pulmonary edema, and decompensation with resultant heart failure; weight gain; swelling or edema; medication-induced neural toxicity; particulate matter embolism and blood vessel occlusion with resultant organ, and/or nervous system infarction; and/or aseptic necrosis of one or more joints. Finally, the patient was informed that Medicine is not an exact science; therefore, there is also the possibility of unforeseen or unpredictable risks and/or possible complications that may result in a catastrophic outcome. The patient indicated having understood very clearly. We have given the patient no guarantees and we have made no promises. Enough time was given to the patient to ask questions, all of which were answered to the patient's satisfaction. Ms. Wadman has indicated that she wanted to continue with the procedure. Attestation: I, the ordering provider, attest that I have discussed with the patient the benefits, risks, side-effects, alternatives, likelihood of achieving goals, and potential problems during recovery for the procedure that I have provided informed consent. Date  Time: 12/17/2022  8:12 AM   Pre-Procedure Preparation:  Monitoring: As per clinic protocol. Respiration, ETCO2, SpO2, BP, heart rate and rhythm monitor placed and checked for adequate function Safety  Precautions: Patient was assessed for positional comfort and pressure points before starting the procedure. Time-out: I initiated and conducted the "Time-out" before starting the procedure, as per protocol. The patient was asked to participate by confirming the accuracy of the "Time Out" information. Verification of the correct person, site, and procedure were performed and confirmed by me, the nursing staff, and the patient. "Time-out" conducted as per Joint Commission's Universal Protocol (UP.01.01.01). Time:  0901 Start Time: 0901 hrs.  Description of Procedure:          Laterality: (see above) Targeted Levels: (see above)  Safety Precautions: Aspiration looking for blood return was conducted prior to all injections. At no point did we inject any substances, as a needle was being advanced. Before injecting, the patient was told to immediately notify me if she was experiencing any new onset of "ringing in the ears, or metallic taste in the mouth". No attempts were made at seeking any paresthesias. Safe injection practices and needle disposal techniques used. Medications properly checked for expiration dates. SDV (single dose vial) medications used. After the completion of the procedure, all disposable equipment used was discarded in the proper designated medical waste containers. Local Anesthesia: Protocol guidelines were followed. The patient was positioned over the fluoroscopy table. The area was prepped in the usual manner. The time-out was completed. The target area was identified using fluoroscopy. A 12-in long, straight, sterile hemostat was used with fluoroscopic guidance to locate the targets for each level blocked. Once located, the skin was marked with an approved surgical skin marker. Once all sites were marked, the skin (epidermis, dermis, and hypodermis), as well as deeper tissues (fat, connective tissue and muscle) were infiltrated with a small amount of a short-acting local anesthetic,  loaded on a 10cc syringe with a 25G, 1.5-in  Needle. An appropriate amount of time was allowed for local anesthetics to take effect before proceeding to the next step. Local Anesthetic: Lidocaine 2.0% The unused portion of the local anesthetic was discarded in the proper designated containers. Technical description of process:   L3 Medial Branch Nerve Block (MBB): The target area for the L3 medial branch is at the junction of the postero-lateral aspect of the superior articular process and the superior, posterior, and medial edge of the transverse process of L4. Under fluoroscopic guidance, a Quincke needle was inserted until contact was made with os over the superior postero-lateral aspect of the pedicular shadow (target area). After negative aspiration for blood, 0.5 mL of the nerve block solution was injected without difficulty or complication. The needle was removed intact. L4 Medial Branch Nerve Block (MBB): The target area for the L4 medial branch is at the junction of the postero-lateral aspect of the superior articular process and the superior, posterior, and medial edge of the transverse process of L5. Under fluoroscopic guidance, a Quincke needle was inserted until contact was made with os over the superior postero-lateral aspect of the pedicular shadow (target area). After negative aspiration for blood, 0.5 mL of the nerve block solution was injected without difficulty or complication. The needle was removed intact. L5 Medial Branch Nerve Block (MBB): The target area for the L5 medial branch is at the junction of the postero-lateral aspect of the superior articular process and the superior, posterior, and medial edge of the sacral ala. Under fluoroscopic guidance, a Quincke needle was inserted until contact was made with os over the superior postero-lateral aspect of the pedicular shadow (target area). After negative aspiration for blood, 0.5 mL of the nerve block solution was injected without  difficulty or complication. The needle was removed intact. S1 Medial Branch Nerve Block (MBB): The target area for the S1 medial branch is at the posterior and inferior 6 o'clock position of the L5-S1 facet joint. Under fluoroscopic guidance, the Quincke needle inserted for the L5 MBB was redirected until contact was made with os over the inferior and postero aspect of the sacrum, at the  6 o' clock position under the L5-S1 facet joint (Target area). After negative aspiration for blood, 0.5 mL of the nerve block solution was injected without difficulty or complication. The needle was removed intact.  Once the entire procedure was completed, the treated area was cleaned, making sure to leave some of the prepping solution back to take advantage of its long term bactericidal properties.         Illustration of the posterior view of the lumbar spine and the posterior neural structures. Laminae of L2 through S1 are labeled. DPRL5, dorsal primary ramus of L5; DPRS1, dorsal primary ramus of S1; DPR3, dorsal primary ramus of L3; FJ, facet (zygapophyseal) joint L3-L4; I, inferior articular process of L4; LB1, lateral branch of dorsal primary ramus of L1; IAB, inferior articular branches from L3 medial branch (supplies L4-L5 facet joint); IBP, intermediate branch plexus; MB3, medial branch of dorsal primary ramus of L3; NR3, third lumbar nerve root; S, superior articular process of L5; SAB, superior articular branches from L4 (supplies L4-5 facet joint also); TP3, transverse process of L3.   Facet Joint Innervation (* possible contribution)  L1-2 T12, L1 (L2*)  Medial Branch  L2-3 L1, L2 (L3*)         "          "  L3-4 L2, L3 (L4*)         "          "  L4-5 L3, L4 (L5*)         "          "  L5-S1 L4, L5, S1          "          "    Vitals:   12/17/22 0909 12/17/22 0919 12/17/22 0929 12/17/22 0939  BP: 90/60 (!) 107/46 (!) 117/94 98/76  Pulse: 77     Resp: 16 10 15 11   Temp:      TempSrc:       SpO2: 100% 95% 99% 98%  Weight:      Height:         End Time: 0909 hrs.  Imaging Guidance (Spinal):          Type of Imaging Technique: Fluoroscopy Guidance (Spinal) Indication(s): Assistance in needle guidance and placement for procedures requiring needle placement in or near specific anatomical locations not easily accessible without such assistance. Exposure Time: Please see nurses notes. Contrast: None used. Fluoroscopic Guidance: I was personally present during the use of fluoroscopy. "Tunnel Vision Technique" used to obtain the best possible view of the target area. Parallax error corrected before commencing the procedure. "Direction-depth-direction" technique used to introduce the needle under continuous pulsed fluoroscopy. Once target was reached, antero-posterior, oblique, and lateral fluoroscopic projection used confirm needle placement in all planes. Images permanently stored in EMR. Interpretation: No contrast injected. I personally interpreted the imaging intraoperatively. Adequate needle placement confirmed in multiple planes. Permanent images saved into the patient's record.  Post-operative Assessment:  Post-procedure Vital Signs:  Pulse/HCG Rate: 7772 Temp: (!) 96.8 F (36 C) Resp: 11 BP: 98/76 SpO2: 98 %  EBL: None  Complications: No immediate post-treatment complications observed by team, or reported by patient.  Note: The patient tolerated the entire procedure well. A repeat set of vitals were taken after the procedure and the patient was kept under observation following institutional policy, for this type of procedure. Post-procedural neurological assessment was performed, showing return to baseline, prior to discharge. The patient  was provided with post-procedure discharge instructions, including a section on how to identify potential problems. Should any problems arise concerning this procedure, the patient was given instructions to immediately contact us, at any  time, without hesitation. In any case, we plan to contact the patient by telephone for a follow-up status report regarding this interventional procedure.  Comments:  No additional relevant information.  Plan of Care (POC)  Orders:  Orders Placed This Encounter  Procedures   LUMBAR FACET(MEDIAL BRANCH NERVE BLOCK) MBNB    Scheduling Instructions:     Procedure: Lumbar facet block (AKA.: Lumbosacral medial branch nerve block)     Side: Bilateral     Level: L3-4, L4-5, L5-S1, and TBD Facets (L2, L3, L4, L5, S1, and TBD Medial Branch Nerves)     Sedation: Patient's choice.     Timeframe: Today    Order Specific Question:   Where will this procedure be performed?    Answer:   [facility]   DG [facility] C-ARM 1-60 MIN NO REPORT    Intraoperative interpretation by procedural physician at [facility].    Standing Status:   Standing    Number of Occurrences:   1    Order Specific Question:   Reason for exam:    Answer:   Assistance in needle guidance and placement for procedures requiring needle placement in or near specific anatomical locations not easily accessible without such assistance.   Informed Consent Details: Physician/Practitioner Attestation; Transcribe to consent form and obtain patient signature    Nursing Order: Transcribe to consent form and obtain patient signature. Note: Always confirm laterality of pain with Ms. Koebel, before procedure.    Order Specific Question:   Physician/Practitioner attestation of informed consent for procedure/surgical case    Answer:   I, the physician/practitioner, attest that I have discussed with the patient the benefits, risks, side effects, alternatives, likelihood of achieving goals and potential problems during recovery for the procedure that I have provided informed consent.    Order Specific Question:   Procedure    Answer:   Lumbar Facet Block  under fluoroscopic guidance    Order Specific Question:    Physician/Practitioner performing the procedure    Answer:   Paytin Ramakrishnan A. Laban Emperor MD    Order Specific Question:   Indication/Reason    Answer:   Low Back Pain, with our without leg pain, due to Facet Joint Arthralgia (Joint Pain) Spondylosis (Arthritis of the Spine), without myelopathy or radiculopathy (Nerve Damage).   Provide equipment / supplies at bedside    Procedure tray: "Block Tray" (Disposable  single use) Skin infiltration needle: Regular 1.5-in, 25-G, (x1) Block Needle type: Spinal Amount/quantity: 4 Size: Regular (3.5-inch) Gauge: 22G    Standing Status:   Standing    Number of Occurrences:   1    Order Specific Question:   Specify    Answer:   Block Tray   Follow-up    Schedule Ms. Janota for a post-procedure follow-up evaluation encounter 2 weeks from now.    Standing Status:   Future    Standing Expiration Date:   12/31/2022    Scheduling Instructions:     Schedule follow-up visit on afternoon of procedure day (T, Th)     Type: Face-to-face (F2F) Post-procedure (PP) evaluation (E/M)     When: 2 weeks from now   Chronic Opioid Analgesic:  Oxycodone/APAP 7.5/325, 1 tab PO q 5 times a day. (56.25 mg/day of oxycodone) MME/day: 56.25 mg/day.  Medications ordered for procedure: Meds ordered this encounter  Medications   lidocaine (XYLOCAINE) 2 % (with pres) injection 400 mg   pentafluoroprop-tetrafluoroeth (GEBAUERS) aerosol   lactated ringers infusion   midazolam (VERSED) 5 MG/5ML injection 0.5-2 mg    Make sure Flumazenil is available in the pyxis when using this medication. If oversedation occurs, administer 0.2 mg IV over 15 sec. If after 45 sec no response, administer 0.2 mg again over 1 min; may repeat at 1 min intervals; not to exceed 4 doses (1 mg)   fentaNYL (SUBLIMAZE) injection 25-50 mcg    Make sure Narcan is available in the pyxis when using this medication. In the event of respiratory depression (RR< 8/min): Titrate NARCAN (naloxone) in increments of 0.1  to 0.2 mg IV at 2-3 minute intervals, until desired degree of reversal.   ropivacaine (PF) 2 mg/mL (0.2%) (NAROPIN) injection 18 mL   triamcinolone acetonide (KENALOG-40) injection 80 mg   Medications administered: We administered lidocaine, pentafluoroprop-tetrafluoroeth, lactated ringers, midazolam, fentaNYL, ropivacaine (PF) 2 mg/mL (0.2%), and triamcinolone acetonide.  See the medical record for exact dosing, route, and time of administration.  Follow-up plan:   Return in about 2 weeks (around 12/31/2022) for Proc-day (T,Th), (Face2F), (PPE).       Interventional Therapies  Risk Factors  Considerations:     Lumbar HARDWARE (NO RFA)     Planned  Pending:   Diagnostic bilateral lumbar MBB (pedicle screw hardware inj.)  #1 (12/17/2022)  Diagnostic/therapeutic midline caudal ESI #2 + sacral interspinous ligament MNB/TPI #1    Under consideration:   Diagnostic lumbar SCS trial    Completed:   Diagnostic bilateral lumbar MBB (pedicle screw hardware inj.) x1 (12/17/2022)  Diagnostic bilateral L4 TFESI x1 (01/08/2022) (100/25/0)  Therapeutic right Racz procedure x1 (08/17/2020) (04/09/84/85) (100% relief of the right LEP) Diagnostic caudal ESI x1 (07/11/2020) (100/100/0/0)  Palliative left L5-S1 LESI x2 (07/27/2015) (70/70/0/0)  Diagnostic left lumbar facet MBB x7 (02/07/2022) (20/20/0/0)  Palliative right lumbar facet MBB x10 (02/07/2022) (20/20/0/0)  Therapeutic right shoulder joint inj. x1 (02/29/2016) (90/80/30)  Therapeutic left IA hip injection x3 (06/13/2020) (100/100/30/>50)  Diagnostic right SI joint Blk x3 (06/17/2017) (100/90/80/80)  Diagnostic/therapeutic left SI joint Blk x4 (06/17/2017) (100/90/80/80)  Palliative bilateral trochanteric bursa inj. x1 (12/11/2016) (100/100/90/90)  Therapeutic right lumbar facet RFA x2 (01/11/2021) (100/100/70)  Therapeutic left lumbar facet RFA x2 (12/19/2020) (100/100/75/>75) Therapeutic right SI joint RFA x1 (08/07/2017) (100/100/80)   Therapeutic left SI joint RFA x1 (09/16/2017) (100/100/100)    Therapeutic  Palliative (PRN) options:   Palliative Racz procedure  Palliative bilateral SI joint block  Palliative right trochanteric bursa injection    Pharmacotherapy  Nonopioids transferred 05/24/2020: Vitamin B12, Flexeril, and Neurontin.        Recent Visits Date Type Provider Dept  10/02/22 Office Visit Delano Metz, MD [facility]  Showing recent visits within past 90 days and meeting all other requirements Today's Visits Date Type Provider Dept  12/17/22 Procedure visit Delano Metz, MD [facility]  Showing today's visits and meeting all other requirements Future Appointments Date Type Provider Dept  01/01/23 Appointment Delano Metz, MD [facility]  01/09/23 Appointment Delano Metz, MD [facility]  Showing future appointments within next 90 days and meeting all other requirements  Disposition: Discharge home  Discharge (Date  Time): 12/17/2022; 0945 hrs.   Primary Care Physician: Jacqueline Loveless, MD Location: [facility] Note by: Jacqueline Done, MD (TTS technology used. I apologize  for any typographical errors that were not detected and corrected.) Date: 12/17/2022; Time: 10:11 AM  Disclaimer:  Medicine is not an Visual merchandiser. The only guarantee in medicine is that nothing is guaranteed. It is important to note that the decision to proceed with this intervention was based on the information collected from the patient. The Data and conclusions were drawn from the patient's questionnaire, the interview, and the physical examination. Because the information was provided in large part by the patient, it cannot be guaranteed that it has not been purposely or unconsciously manipulated. Every effort has been made to obtain as much relevant data as possible for this evaluation. It is important to note that the  conclusions that lead to this procedure are derived in large part from the available data. Always take into account that the treatment will also be dependent on availability of resources and existing treatment guidelines, considered by other Pain Management Practitioners as being common knowledge and practice, at the time of the intervention. For Medico-Legal purposes, it is also important to point out that variation in procedural techniques and pharmacological choices are the acceptable norm. The indications, contraindications, technique, and results of the above procedure should only be interpreted and judged by a Board-Certified Interventional Pain Specialist with extensive familiarity and expertise in the same exact procedure and technique.

## 2022-12-17 ENCOUNTER — Ambulatory Visit
Admission: RE | Admit: 2022-12-17 | Discharge: 2022-12-17 | Disposition: A | Discharge status: Home / Self Care | Payer: Medicare Other | Source: Ambulatory Visit | Attending: Pain Medicine | Admitting: Pain Medicine

## 2022-12-17 ENCOUNTER — Ambulatory Visit: Payer: Medicare Other | Attending: Pain Medicine | Admitting: Pain Medicine

## 2022-12-17 ENCOUNTER — Encounter: Payer: Self-pay | Admitting: Pain Medicine

## 2022-12-17 VITALS — BP 98/76 | HR 77 | Temp 96.8°F | Resp 11 | Ht 60.0 in | Wt 139.0 lb

## 2022-12-17 DIAGNOSIS — M5459 Other low back pain: Secondary | ICD-10-CM | POA: Diagnosis present

## 2022-12-17 DIAGNOSIS — G8929 Other chronic pain: Secondary | ICD-10-CM | POA: Diagnosis present

## 2022-12-17 DIAGNOSIS — M545 Low back pain, unspecified: Secondary | ICD-10-CM | POA: Insufficient documentation

## 2022-12-17 DIAGNOSIS — M47816 Spondylosis without myelopathy or radiculopathy, lumbar region: Secondary | ICD-10-CM

## 2022-12-17 DIAGNOSIS — Z5189 Encounter for other specified aftercare: Secondary | ICD-10-CM | POA: Insufficient documentation

## 2022-12-17 DIAGNOSIS — M5136 Other intervertebral disc degeneration, lumbar region: Secondary | ICD-10-CM | POA: Insufficient documentation

## 2022-12-17 DIAGNOSIS — M47817 Spondylosis without myelopathy or radiculopathy, lumbosacral region: Secondary | ICD-10-CM | POA: Diagnosis present

## 2022-12-17 MED ORDER — FENTANYL CITRATE (PF) 100 MCG/2ML IJ SOLN
INTRAMUSCULAR | Status: AC
  Filled 2022-12-17: qty 2

## 2022-12-17 MED ORDER — PENTAFLUOROPROP-TETRAFLUOROETH EX AERO
INHALATION_SPRAY | Freq: Once | CUTANEOUS | Status: AC
  Filled 2022-12-17: qty 116

## 2022-12-17 MED ORDER — MIDAZOLAM HCL 5 MG/5ML IJ SOLN
INTRAMUSCULAR | Status: AC
  Filled 2022-12-17: qty 5

## 2022-12-17 MED ORDER — TRIAMCINOLONE ACETONIDE 40 MG/ML IJ SUSP
80.0000 mg | Freq: Once | INTRAMUSCULAR | Status: AC
  Administered 2022-12-17: 80 mg

## 2022-12-17 MED ORDER — LACTATED RINGERS IV SOLN: Freq: Once | INTRAVENOUS | Status: AC

## 2022-12-17 MED ORDER — MIDAZOLAM HCL 5 MG/5ML IJ SOLN
0.5000 mg | Freq: Once | INTRAMUSCULAR | Status: AC
  Administered 2022-12-17: 2 mg via INTRAVENOUS
  Administered 2022-12-17: 1 mg via INTRAVENOUS

## 2022-12-17 MED ORDER — ROPIVACAINE HCL 2 MG/ML IJ SOLN
18.0000 mL | Freq: Once | INTRAMUSCULAR | Status: AC
  Administered 2022-12-17: 18 mL via PERINEURAL

## 2022-12-17 MED ORDER — TRIAMCINOLONE ACETONIDE 40 MG/ML IJ SUSP
INTRAMUSCULAR | Status: AC
  Filled 2022-12-17: qty 1

## 2022-12-17 MED ORDER — LIDOCAINE HCL 2 % IJ SOLN
20.0000 mL | Freq: Once | INTRAMUSCULAR | Status: AC
  Administered 2022-12-17: 400 mg

## 2022-12-17 MED ORDER — FENTANYL CITRATE (PF) 100 MCG/2ML IJ SOLN
25.0000 ug | INTRAMUSCULAR | Status: DC | PRN
  Administered 2022-12-17: 50 ug via INTRAVENOUS

## 2022-12-17 MED ORDER — LIDOCAINE HCL 2 % IJ SOLN
INTRAMUSCULAR | Status: AC
  Filled 2022-12-17: qty 20

## 2022-12-17 MED ORDER — ROPIVACAINE HCL 2 MG/ML IJ SOLN
INTRAMUSCULAR | Status: AC
  Filled 2022-12-17: qty 20

## 2022-12-17 NOTE — Patient Instructions (Signed)

## 2022-12-18 ENCOUNTER — Telehealth: Payer: Self-pay

## 2022-12-18 NOTE — Telephone Encounter (Signed)
Post procedure follow up.  Patient states she is doing good.  

## 2022-12-30 NOTE — Progress Notes (Unsigned)
PROVIDER NOTE: Information contained herein reflects review and annotations entered in association with encounter. Interpretation of such information and data should be left to medically-trained personnel. Information provided to patient can be located elsewhere in the medical record under "Patient Instructions". Document created using STT-dictation technology, any transcriptional errors that may result from process are unintentional.    Patient: Jacqueline Thornton  Service Category: E/M  Provider: Oswaldo Done, MD  DOB: August 07, 1950  DOS: 01/01/2023  Referring Provider: Margaretann Loveless, MD  MRN: [id number]  Specialty: Interventional Pain Management  PCP: Margaretann Loveless, MD  Type: Established Patient  Setting: Ambulatory outpatient    Location: Office  Delivery: Face-to-face     HPI  Jacqueline Thornton, a 72 y.o. year old female, is here today because of her No primary diagnosis found.. Jacqueline Thornton's primary complain today is No chief complaint on file.  Pertinent problems: Jacqueline Thornton has Chronic pain syndrome; Headache, migraine; Chronic low back pain (Bilateral) (R>L) w/o sciatica; Spondylosis of lumbar spine; Lumbar annular disc tear (L4-5); Discogenic low back pain (L3-4 and L4-5); Lumbar facet hypertrophy; Lumbar facet syndrome (Bilateral) (R>L); Chronic neck pain (Left); Cervical spondylosis; Hx of cervical spine surgery; Cervical spinal fusion (C6-7 interbody fusion); Chronic sacroiliac joint pain (Bilateral) (L>R); Chronic lumbar radicular pain (Left); Trochanteric bursitis of hip (Right); Chronic hip pain (Bilateral) (L>R); Neurogenic pain; Myofascial pain; Osteoarthritis of hip (Left); Arthrodesis status; DDD (degenerative disc disease), lumbar; Myalgia; Trochanteric bursitis of hip (Bilateral) (L>R); Spondylosis without myelopathy or radiculopathy, lumbosacral region; Spondylosis without myelopathy or radiculopathy, sacral and sacrococcygeal region; Other specified dorsopathies, sacral and  sacrococcygeal region; Age-related osteoporosis without current pathological fracture; Bone island of right femur; Inflammatory spondylopathy of lumbar region [facility]); Trigger point with back pain (Right); Fibromyalgia; DDD (degenerative disc disease), cervical; Spondylolisthesis of lumbar region; History of lumbar spinal fusion (Dr. Maeola Harman) (12/11/2018); Failed back surgical syndrome; Unilateral occipital headache (Left); Cervicogenic headache (Left); Chronic hip pain (Left); Spondylolisthesis at L5-S1 level; Spondylosis of lumbosacral region without myelopathy or radiculopathy; Epidural fibrosis; Enthesopathy of hip region (Left); Chronic sacroiliac joint pain (Left); Enthesopathy of sacroiliac joint (Left); Somatic dysfunction of sacroiliac joint (Left); Herniated cervical disc without myelopathy; Chronic sacroiliac joint pain (Right); Osteoarthritis of sacroiliac joint (Right) (HCC); Somatic dysfunction of sacroiliac joint (Right); Chronic hip pain (Right); Neck pain; Herniated nucleus pulposus, C3-4; Prolapsed cervical intervertebral disc; Cervical spinal stenosis; Radiculopathy, cervical region; Lumbar central spinal stenosis, 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 Pain Assessment: Severity of   is reported as a  /10. Location:    / . Onset:  . Quality:  . Timing:  . Modifying factor(s):  Marland Kitchen Vitals:  vitals were not taken for this visit.  BMI: Estimated body mass index is 27.15 kg/m as calculated from the following:   Height as of 12/17/22: 5'  (1.524 m).   Weight as of 12/17/22: 139 lb (63 kg). Last encounter: 10/02/2022. Last procedure: 12/17/2022.  Reason for encounter:  *** . ***  Pharmacotherapy Assessment  Analgesic: Oxycodone/APAP 7.5/325, 1 tab PO q 5 times a day. (56.25 mg/day of oxycodone) MME/day: 56.25 mg/day.   Monitoring: [place] PMP:  PDMP reviewed during this encounter.       Pharmacotherapy: No side-effects or adverse reactions reported. Compliance: No problems identified. Effectiveness: Clinically acceptable.  No notes on file  No results found for: "CBDTHCR" No results found for: "D8THCCBX" No results found for: "D9THCCBX"  UDS:  Summary  Date Value Ref Range Status  12/19/2021 Note  Final    Comment:    ==================================================================== [employer] [facility] [employer] (MW) ==================================================================== Test                             Result       Flag       Units  Drug Present and Declared for Prescription Verification   Oxycodone                      >8772        EXPECTED   ng/mg creat   Oxymorphone                    1343         EXPECTED   ng/mg creat   Noroxycodone                   >8772        EXPECTED   ng/mg creat   Noroxymorphone                 446          EXPECTED   ng/mg creat    Sources of oxycodone are scheduled prescription medications.    Oxymorphone, noroxycodone, and noroxymorphone are expected    metabolites of oxycodone. Oxymorphone is also available as a    scheduled prescription medication.  ==================================================================== Test                      Result    Flag   Units      Ref Range   Creatinine              114              mg/dL      >=16 ==================================================================== Declared Medications:  The flagging and interpretation on this report are based on the  following declared medications.  Unexpected results may arise from  inaccuracies  in the declared medications.   **Note: The testing scope of this panel includes these medications:   Oxycodone (Percocet)   **Note: The testing scope of this panel does not include the  following reported medications:   Acetaminophen (Percocet)  Atorvastatin  Calcium  Cyclobenzaprine (Flexeril)  Duloxetine (Cymbalta)  Enalapril (Vasotec)  Estradiol (Estrace)  Gabapentin (Neurontin)  Ibandronate (Boniva)  Melatonin  Multivitamin  Omeprazole  Spironolactone  Vitamin B12 ==================================================================== For clinical consultation, please call [phone]. ====================================================================       ROS  Constitutional: Denies any fever or chills Gastrointestinal: No reported hemesis, hematochezia, vomiting, or acute GI distress Musculoskeletal: Denies any acute onset joint swelling, redness, loss of ROM, or weakness Neurological: No reported episodes of acute onset apraxia, aphasia, dysarthria, agnosia, amnesia, paralysis, loss of coordination, or loss of consciousness  Medication Review  Calcium-Vitamin D-Vitamin K, Cyanocobalamin, DULoxetine, Magnesium, Melatonin, amoxicillin-clavulanate, atorvastatin, docusate sodium, enalapril, estradiol, furosemide, gabapentin, methocarbamol, multivitamin with minerals, naloxone, omeprazole, oxyCODONE-acetaminophen, and spironolactone  History Review  Allergy: Jacqueline Thornton has No Known Allergies. Drug: Jacqueline Thornton  reports no history of drug use. Alcohol:  reports  no history of alcohol use. Tobacco:  reports that she quit smoking about 19 years ago. Her smoking use included cigarettes. She has never used smokeless tobacco. Social: Jacqueline Thornton  reports that she quit smoking about 19 years ago. Her smoking use included cigarettes. She has never used smokeless tobacco. She reports that she does not drink alcohol and does not use drugs. Medical:  has a past medical history  of Absolute anemia (04/11/2015), Acute postoperative pain (08/07/2017), Angina pectoris (HCC), Anxiety, Arthritis, Arthropathy of sacroiliac joint (04/11/2015), Atypical face pain (04/24/2015), Back pain, CAD (coronary artery disease), Cancer (HCC), Chronic back pain, Constipation, Depression, Fibromyalgia, GERD (gastroesophageal reflux disease), 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) (04/11/2015), H/O: hysterectomy (1979), Heart murmur, Hyperlipidemia, Hypertension, Low back pain (04/06/2015), Lumbar radicular pain (04/18/2015), Migraine, Narrowing of intervertebral disc space (04/11/2015), Sacroiliac joint pain (04/11/2015), and Spine disorder. Surgical: Jacqueline Thornton  has a past surgical history that includes Cholecystectomy; Abdominal hysterectomy; Neck surgery; Colonoscopy with propofol (N/A, 07/19/2015); caract surger; Laparoscopic salpingo oophorectomy (Bilateral, 03/03/2018); Cystoscopy (03/03/2018); Tonsillectomy; Appendectomy; Tumor removal; Anterior lumbar fusion (N/A, 12/11/2018); Abdominal exposure (N/A, 12/11/2018); Anterior cervical decomp/discectomy fusion (N/A, 10/06/2020); Toe Fusion (Right); Esophagogastroduodenoscopy (egd) with propofol (N/A, 09/11/2021); Colonoscopy with propofol (N/A, 09/11/2021); Colonoscopy with propofol (N/A, 03/05/2022); and Transforaminal lumbar interbody fusion w/ mis 1 level (Right, 04/30/2022). Family: family history includes Colon cancer in her maternal grandmother and mother; Diabetes in her father and mother; Hypertension in her sister; Stroke in her mother.  Laboratory Chemistry Profile   Renal Lab Results  Component Value Date   BUN 9 04/24/2022   CREATININE 0.95 04/24/2022   BCR 8 (L) 12/16/2019   GFRAA 47 (L) 12/16/2019   GFRNONAA >60 04/24/2022    Hepatic Lab Results  Component Value Date   AST 15 12/16/2019   ALT 14 03/08/2018   ALBUMIN 4.0 12/16/2019   ALKPHOS 60 12/16/2019    Electrolytes Lab Results  Component Value Date   NA 136  04/24/2022   K 3.6 04/24/2022   CL 101 04/24/2022   CALCIUM 8.7 (L) 04/24/2022   MG 2.4 (H) 12/16/2019    Bone Lab Results  Component Value Date   25OHVITD1 53 12/16/2019   25OHVITD2 <1.0 12/16/2019   25OHVITD3 53 12/16/2019    Inflammation (CRP: Acute Phase) (ESR: Chronic Phase) Lab Results  Component Value Date   CRP 4 12/16/2019   ESRSEDRATE 6 12/16/2019         Note: Above Lab results reviewed.  Recent Imaging Review  DG PAIN CLINIC C-ARM 1-60 MIN NO REPORT Fluoro was used, but no Radiologist interpretation will be provided.  Please refer to "NOTES" tab for provider progress note. Note: Reviewed        Physical Exam  General appearance: Well nourished, well developed, and well hydrated. In no apparent acute distress Mental status: Alert, oriented x 3 (person, place, & time)       Respiratory: No evidence of acute respiratory distress Eyes: PERLA Vitals: There were no vitals taken for this visit. BMI: Estimated body mass index is 27.15 kg/m as calculated from the following:   Height as of 12/17/22: 5' (1.524 m).   Weight as of 12/17/22: 139 lb (63 kg). Ideal: Ideal body weight: 45.5 kg (100 lb 4.9 oz) Adjusted ideal body weight: 52.5 kg (115 lb 12.6 oz)  Assessment   Diagnosis Status  No diagnosis found. Controlled Controlled Controlled   Updated Problems: No problems updated.  Plan of Care  Problem-specific:  No problem-specific Assessment &  Plan notes found for this encounter.  Jacqueline Thornton has a current medication list which includes the following long-term medication(s): atorvastatin, viactiv calcium plus d, cyanocobalamin, duloxetine, enalapril, furosemide, omeprazole, oxycodone-acetaminophen, oxycodone-acetaminophen, oxycodone-acetaminophen, and spironolactone.  Pharmacotherapy (Medications Ordered): No orders of the defined types were placed in this encounter.  Orders:  No orders of the defined types were placed in this  encounter.  Follow-up plan:   No follow-ups on file.      Interventional Therapies  Risk Factors  Considerations:     Lumbar HARDWARE (NO RFA)     Planned  Pending:   Diagnostic bilateral lumbar MBB (pedicle screw hardware inj.)  #1 (12/17/2022)  Diagnostic/therapeutic midline caudal ESI #2 + sacral interspinous ligament MNB/TPI #1    Under consideration:   Diagnostic lumbar SCS trial    Completed:   Diagnostic bilateral lumbar MBB (pedicle screw hardware inj.) x1 (12/17/2022)  Diagnostic bilateral L4 TFESI x1 (01/08/2022) (100/25/0)  Therapeutic right Racz procedure x1 (08/17/2020) (04/09/84/85) (100% relief of the right LEP) Diagnostic caudal ESI x1 (07/11/2020) (100/100/0/0)  Palliative left L5-S1 LESI x2 (07/27/2015) (70/70/0/0)  Diagnostic left lumbar facet MBB x7 (02/07/2022) (20/20/0/0)  Palliative right lumbar facet MBB x10 (02/07/2022) (20/20/0/0)  Therapeutic right shoulder joint inj. x1 (02/29/2016) (90/80/30)  Therapeutic left IA hip injection x3 (06/13/2020) (100/100/30/>50)  Diagnostic right SI joint Blk x3 (06/17/2017) (100/90/80/80)  Diagnostic/therapeutic left SI joint Blk x4 (06/17/2017) (100/90/80/80)  Palliative bilateral trochanteric bursa inj. x1 (12/11/2016) (100/100/90/90)  Therapeutic right lumbar facet RFA x2 (01/11/2021) (100/100/70)  Therapeutic left lumbar facet RFA x2 (12/19/2020) (100/100/75/>75) Therapeutic right SI joint RFA x1 (08/07/2017) (100/100/80)  Therapeutic left SI joint RFA x1 (09/16/2017) (100/100/100)    Therapeutic  Palliative (PRN) options:   Palliative Racz procedure  Palliative bilateral SI joint block  Palliative right trochanteric bursa injection    Pharmacotherapy  Nonopioids transferred 05/24/2020: Vitamin B12, Flexeril, and Neurontin.         Recent Visits Date Type Provider Dept  12/17/22 Procedure visit Delano Metz, MD [facility]  10/02/22 Office Visit Delano Metz, MD [facility]   Showing recent visits within past 90 days and meeting all other requirements Future Appointments Date Type Provider Dept  01/01/23 Appointment Delano Metz, MD [facility]  01/09/23 Appointment Delano Metz, MD [facility]  Showing future appointments within next 90 days and meeting all other requirements  I discussed the assessment and treatment plan with the patient. The patient was provided an opportunity to ask questions and all were answered. The patient agreed with the plan and demonstrated an understanding of the instructions.  Patient advised to call back or seek an in-person evaluation if the symptoms or condition worsens.  Duration of encounter: *** minutes.  Total time on encounter, as per AMA guidelines included both the face-to-face and non-face-to-face time personally spent by the physician and/or other qualified health care professional(s) on the day of the encounter (includes time in activities that require the physician or other qualified health care professional and does not include time in activities normally performed by clinical staff). Physician's time may include the following activities when performed: Preparing to see the patient (e.g., pre-charting review of records, searching for previously ordered imaging, lab work, and nerve conduction tests) Review of prior analgesic pharmacotherapies. Reviewing PMP Interpreting ordered tests (e.g., lab work, imaging, nerve conduction tests) Performing post-procedure evaluations, including interpretation of diagnostic procedures Obtaining and/or reviewing separately obtained history Performing a medically appropriate examination and/or evaluation Counseling  and educating the patient/family/caregiver Ordering medications, tests, or procedures Referring and communicating with other health care professionals (when not separately reported) Documenting clinical information in the electronic or other  health record Independently interpreting results (not separately reported) and communicating results to the patient/ family/caregiver Care coordination (not separately reported)  Note by: Oswaldo Done, MD Date: 01/01/2023; Time: 6:02 AM

## 2023-01-01 ENCOUNTER — Encounter: Payer: Self-pay | Admitting: Pain Medicine

## 2023-01-01 ENCOUNTER — Ambulatory Visit: Payer: Medicare Other | Attending: Pain Medicine | Admitting: Pain Medicine

## 2023-01-01 VITALS — BP 126/80 | HR 82 | Temp 97.0°F | Ht 60.0 in | Wt 139.0 lb

## 2023-01-01 DIAGNOSIS — M961 Postlaminectomy syndrome, not elsewhere classified: Secondary | ICD-10-CM

## 2023-01-01 DIAGNOSIS — G8929 Other chronic pain: Secondary | ICD-10-CM | POA: Insufficient documentation

## 2023-01-01 DIAGNOSIS — M79604 Pain in right leg: Secondary | ICD-10-CM | POA: Diagnosis present

## 2023-01-01 DIAGNOSIS — M79605 Pain in left leg: Secondary | ICD-10-CM | POA: Insufficient documentation

## 2023-01-01 DIAGNOSIS — M5136 Other intervertebral disc degeneration, lumbar region: Secondary | ICD-10-CM

## 2023-01-01 DIAGNOSIS — M7062 Trochanteric bursitis, left hip: Secondary | ICD-10-CM | POA: Diagnosis present

## 2023-01-01 DIAGNOSIS — M7061 Trochanteric bursitis, right hip: Secondary | ICD-10-CM | POA: Diagnosis present

## 2023-01-01 DIAGNOSIS — M25551 Pain in right hip: Secondary | ICD-10-CM | POA: Insufficient documentation

## 2023-01-01 DIAGNOSIS — Z79899 Other long term (current) drug therapy: Secondary | ICD-10-CM

## 2023-01-01 DIAGNOSIS — M25552 Pain in left hip: Secondary | ICD-10-CM | POA: Insufficient documentation

## 2023-01-01 DIAGNOSIS — M545 Low back pain, unspecified: Secondary | ICD-10-CM | POA: Insufficient documentation

## 2023-01-01 DIAGNOSIS — Z79891 Long term (current) use of opiate analgesic: Secondary | ICD-10-CM | POA: Insufficient documentation

## 2023-01-01 DIAGNOSIS — M533 Sacrococcygeal disorders, not elsewhere classified: Secondary | ICD-10-CM | POA: Diagnosis present

## 2023-01-01 DIAGNOSIS — G894 Chronic pain syndrome: Secondary | ICD-10-CM

## 2023-01-01 DIAGNOSIS — M542 Cervicalgia: Secondary | ICD-10-CM | POA: Insufficient documentation

## 2023-01-01 DIAGNOSIS — M47816 Spondylosis without myelopathy or radiculopathy, lumbar region: Secondary | ICD-10-CM | POA: Diagnosis present

## 2023-01-01 MED ORDER — OXYCODONE-ACETAMINOPHEN 7.5-325 MG PO TABS: 1.0000 | ORAL_TABLET | Freq: Every day | ORAL | 0 refills | Status: DC

## 2023-01-01 NOTE — Progress Notes (Signed)
Nursing Pain Medication Assessment:  Safety precautions to be maintained throughout the outpatient stay will include: orient to surroundings, keep bed in low position, maintain call bell within reach at all times, provide assistance with transfer out of bed and ambulation.  Medication Inspection Compliance: Pill count conducted under aseptic conditions, in front of the patient. Neither the pills nor the bottle was removed from the patient's sight at any time. Once count was completed pills were immediately returned to the patient in their original bottle.  Medication: Oxycodone/APAP Pill/Patch Count:  36 of 150 pills remain Pill/Patch Appearance: Markings consistent with prescribed medication Bottle Appearance: Standard pharmacy container. Clearly labeled. Filled Date: 6 / 12 / 2024 Last Medication intake:  TodaySafety precautions to be maintained throughout the outpatient stay will include: orient to surroundings, keep bed in low position, maintain call bell within reach at all times, provide assistance with transfer out of bed and ambulation.

## 2023-01-01 NOTE — Patient Instructions (Addendum)
TENS (Device can be purchased "online", without prescription. Search: "TENS 7000".) Transcutaneous electrical nerve stimulation (TENS) is a method of pain relief involving the use of a mild electrical current. A TENS machine is a small, battery-operated device that has leads connected to sticky pads called electrodes.   Rechargeable 9V batteries:     Electrode placement:   TENS UNIT SAFETY WARNING SHEET and INFORMATION INDICATIONS AND CONTRAINDICTIONS Read the operation manual before using the device. Freight forwarder ([place]) restricts this device to sale by or on the order of a physician. Observe your physician's precise instructions and let him show you where to apply the electrodes. For a successful therapy, the correct application of the electrodes is an important factor. Carefully write down the settings your physician recommended. Indications for use This device is a prescription device and only for symptomatic relief of chronic intractable pain. Contraindications:   Any electrode placement that applies current to the carotid sinus (neck) region.   Patients with implanted electronic devices (for example, a pacemaker) or metallic implants should not undertake.   Any electrode placement that causes current to flow transcerebrally (through the head). The use of unit whenever pain symptoms are undiagnosed, unit etiology is determined.   The use of TENS whenever pain syndromes are undiagnosed, until etiology is established.  WARNINGS AND PRECAUTIONS  Warnings:   The device must be kept out of reach of children.   The safety of device for use during pregnancy or delivery has not been established.   Do not place electrodes on front of the throat. This may result in spasms of the laryngeal and pharyngeal muscles.   Do not place the electrodes over the carotid nerve (side of neck below ear).   The device is not effective for pain of central origin (headaches).   The device may interfere with electronic  monitoring equipment (such as ECG monitors and ECG alarms).   Electrodes should not be placed over the eyes, in the mouth, or internally.   These devices have no curative value.   TENS devices should be used only under the continued supervision of a physician.   TENS is a symptomatic treatment and as such suppresses the sensation of pain which would otherwise serve as a protective mechanism. Precautions/Adverse Reactions   Isolated cases of skin irritation may occur at the site of electrode placement following long-term application.   Stimulation should be stopped and electrodes removed until the cause of the irritation can be determined.   Effectiveness is highly dependent upon patient selection by a person qualified in the management of pain patients.   If the device treatment becomes ineffective or unpleasant, stimulation should be discontinued until reevaluation by a physician/clinician.   Always turn the device off before applying or removing electrodes.   Skin irritation and electrode burns are potential adverse reactions.  PURPOSE: A Transcutaneous Electrical Nerve Stimulator, or TENS, unit is designed to relieve post-operative, acute and chronic pain. It is used for pain caused by peripheral nerves and not central. TENS units are prescription-only devices.  OPERATION: TENS units work in a couple of ways. The first way they are thought to work is by a method called the [employer]. The [employer] states that our brains can only handle one stimulus at a time. When you have chronic pain, this pain signal is constantly being sent to your brain and recognized as pain. When an electrical stimulus is added to the area of pain the body feels this electrical stimulus, and  since the brain can only handle one thing at a time, the pain is not transmitted to the brain. The second method thought to be part of TENS unit's success is by way of stimulating our own bodies to release their own natural painkillers. TENS  units do not work for everyone and results may vary. Always follow the instructions and warnings in your user's manual.  USE: One of the most important tasks that must be performed is battery maintenance. If you are using a Engineering geologist, always fully charge it and fully deplete it before charging it again. These batteries can develop memories and by not performing this charging task correctly, your battery's life can be greatly diminished. If your battery does develop a memory you can help expand the memory by charging for 12 - 13 hours and then completely depleting the battery. Always prepare the skin before applying electrodes. Your skin should be clean and free of any lotions or creams. If you are using electrodes that use conductive gel, apply a small, even layer over the electrode. For carbon, self-adhesive electrodes, apply a drop of water to the electrodes before applying to the skin. The electrodes attach to the lead wires and then the TENS unit. Always grasp the connector and not the cord when inserting or removing. When making adjustments, always make sure the unit's channels (1 and 2) are in the OFF position. The actual settings should be recommended and prescribed by your physician. Medical equipment suppliers don'tset or instruct users as to user settings. When you are using the BURST mode, the unit delivers a series of quick pulses followed by a rest. This cycle repeats itself frequently. Always have channels OFF before changing modes.  For MODULATION mode, the stimulation automatically varies the width of the pulse.  For CONVENTIONAL mode, the stimulation is constant. After the settings have been fine-tuned, set the timer to 30 or 60 minutes. Your physician should also prescribe the use time. When the lights become dim, it means your batteries should be replaced or recharged.  ACCESSORIES: The electrodes and lead wires can be obtained from your medical equipment supplier.  Your medical equipment supplier can set up a recurring delivery to accommodate your needs. Electrodes should be replaced once a month and lead wires once every 6 months.  Video Tutorial [email or web address]://[email or web address]/[email or web address]     ____________________________________________________________________________________________  Muscle Spasms & Cramps  Cause(s):  Most common - vitamin and/or electrolyte (calcium, potassium, sodium, etc.) deficiencies. Post procedure - steroids can make your kidneys excrete electrolytes. If you happen to have been borderline low on your electrolytes, it may temporarily triggering cramps & spasms.  Possible triggers: Sweating - causes loss of electrolytes thru the skin. Steroids - causes loss of electrolytes thru the urine.  Treatment: Gatorade (or any other electrolyte-replenishing drink) - Take 1, 8 oz glass with each meal (3 times a day). OTC (over-the-counter) Magnesium 400 to 500 mg - Take 1 tablet twice a day (one with breakfast and one before bedtime). If you have kidney problems, talk to your primary care physician before taking any Magnesium. Tonic Water with quinine - Take 1, 8 oz glass before bedtime.   ____________________________________________________________________________________________     ____________________________________________________________________________________________  Opioid Pain Medication Update  To: All patients taking opioid pain medications. (I.e.: hydrocodone, hydromorphone, oxycodone, oxymorphone, morphine, codeine, methadone, tapentadol, tramadol, buprenorphine, fentanyl, etc.)  Re: Updated review of side effects and adverse reactions of opioid analgesics, as well as new information about long term effects of this class  of medications.  Direct risks of long-term opioid therapy are not limited to opioid addiction and overdose. Potential medical risks include serious fractures, breathing problems during  sleep, hyperalgesia, immunosuppression, chronic constipation, bowel obstruction, myocardial infarction, and tooth decay secondary to xerostomia.  Unpredictable adverse effects that can occur even if you take your medication correctly: Cognitive impairment, respiratory depression, and death. Most people think that if they take their medication "correctly", and "as instructed", that they will be safe. Nothing could be farther from the truth. In reality, a significant amount of recorded deaths associated with the use of opioids has occurred in individuals that had taken the medication for a long time, and were taking their medication correctly. The following are examples of how this can happen: Patient taking his/her medication for a long time, as instructed, without any side effects, is given a certain antibiotic or another unrelated medication, which in turn triggers a "Drug-to-drug interaction" leading to disorientation, cognitive impairment, impaired reflexes, respiratory depression or an untoward event leading to serious bodily harm or injury, including death.  Patient taking his/her medication for a long time, as instructed, without any side effects, develops an acute impairment of liver and/or kidney function. This will lead to a rapid inability of the body to breakdown and eliminate their pain medication, which will result in effects similar to an "overdose", but with the same medicine and dose that they had always taken. This again may lead to disorientation, cognitive impairment, impaired reflexes, respiratory depression or an untoward event leading to serious bodily harm or injury, including death.  A similar problem will occur with patients as they grow older and their liver and kidney function begins to decrease as part of the aging process.  Background information: Historically, the original case for using long-term opioid therapy to treat chronic noncancer pain was based on safety assumptions  that subsequent experience has called into question. In 1996, the American Pain Society and the American Academy of Pain Medicine issued a consensus statement supporting long-term opioid therapy. This statement acknowledged the dangers of opioid prescribing but concluded that the risk for addiction was low; respiratory depression induced by opioids was short-lived, occurred mainly in opioid-naive patients, and was antagonized by pain; tolerance was not a common problem; and efforts to control diversion should not constrain opioid prescribing. This has now proven to be wrong. Experience regarding the risks for opioid addiction, misuse, and overdose in community practice has failed to support these assumptions.  According to the Centers for Disease Control and Prevention, fatal overdoses involving opioid analgesics have increased sharply over the past decade. Currently, more than 96,700 people die from drug overdoses every year. Opioids are a factor in 7 out of every 10 overdose deaths. Deaths from drug overdose have surpassed motor vehicle accidents as the leading cause of death for individuals between the ages of 25 and 43.  Clinical data suggest that neuroendocrine dysfunction may be very common in both men and women, potentially causing hypogonadism, erectile dysfunction, infertility, decreased libido, osteoporosis, and depression. Recent studies linked higher opioid dose to increased opioid-related mortality. Controlled observational studies reported that long-term opioid therapy may be associated with increased risk for cardiovascular events. Subsequent meta-analysis concluded that the safety of long-term opioid therapy in elderly patients has not been proven.   Side Effects and adverse reactions: Common side effects: Drowsiness (sedation). Dizziness. Nausea and vomiting. Constipation. Physical dependence -- Dependence often manifests with withdrawal symptoms when opioids are discontinued or  decreased. Tolerance -- As you take  repeated doses of opioids, you require increased medication to experience the same effect of pain relief. Respiratory depression -- This can occur in healthy people, especially with higher doses. However, people with COPD, asthma or other lung conditions may be even more susceptible to fatal respiratory impairment.  Uncommon side effects: An increased sensitivity to feeling pain and extreme response to pain (hyperalgesia). Chronic use of opioids can lead to this. Delayed gastric emptying (the process by which the contents of your stomach are moved into your small intestine). Muscle rigidity. Immune system and hormonal dysfunction. Quick, involuntary muscle jerks (myoclonus). Arrhythmia. Itchy skin (pruritus). Dry mouth (xerostomia).  Long-term side effects: Chronic constipation. Sleep-disordered breathing (SDB). Increased risk of bone fractures. Hypothalamic-pituitary-adrenal dysregulation. Increased risk of overdose.  RISKS: Fractures and Falls:  Opioids increase the risk and incidence of falls. This is of particular importance in elderly patients.  Endocrine System:  Long-term administration is associated with endocrine abnormalities (endocrinopathies). (Also known as Opioid-induced Endocrinopathy) Influences on both the hypothalamic-pituitary-adrenal axis?and the hypothalamic-pituitary-gonadal axis have been demonstrated with consequent hypogonadism and adrenal insufficiency in both sexes. Hypogonadism and decreased levels of dehydroepiandrosterone sulfate have been reported in men and women. Endocrine effects include: Amenorrhoea in women (abnormal absence of menstruation) Reduced libido in both sexes Decreased sexual function Erectile dysfunction in men Hypogonadisms (decreased testicular function with shrinkage of testicles) Infertility Depression and fatigue Loss of muscle mass Anxiety Depression Immune  suppression Hyperalgesia Weight gain Anemia Osteoporosis Patients (particularly women of childbearing age) should avoid opioids. There is insufficient evidence to recommend routine monitoring of asymptomatic patients taking opioids in the long-term for hormonal deficiencies.  Immune System: Human studies have demonstrated that opioids have an immunomodulating effect. These effects are mediated via opioid receptors both on immune effector cells and in the central nervous system. Opioids have been demonstrated to have adverse effects on antimicrobial response and anti-tumour surveillance. Buprenorphine has been demonstrated to have no impact on immune function.  Opioid Induced Hyperalgesia: Human studies have demonstrated that prolonged use of opioids can lead to a state of abnormal pain sensitivity, sometimes called opioid induced hyperalgesia (OIH). Opioid induced hyperalgesia is not usually seen in the absence of tolerance to opioid analgesia. Clinically, hyperalgesia may be diagnosed if the patient on long-term opioid therapy presents with increased pain. This might be qualitatively and anatomically distinct from pain related to disease progression or to breakthrough pain resulting from development of opioid tolerance. Pain associated with hyperalgesia tends to be more diffuse than the pre-existing pain and less defined in quality. Management of opioid induced hyperalgesia requires opioid dose reduction.  Cancer: Chronic opioid therapy has been associated with an increased risk of cancer among noncancer patients with chronic pain. This association was more evident in chronic strong opioid users. Chronic opioid consumption causes significant pathological changes in the small intestine and colon. Epidemiological studies have found that there is a link between opium dependence and initiation of gastrointestinal cancers. Cancer is the second leading cause of death after cardiovascular disease.  Chronic use of opioids can cause multiple conditions such as GERD, immunosuppression and renal damage as well as carcinogenic effects, which are associated with the incidence of cancers.   Mortality: Long-term opioid use has been associated with increased mortality among patients with chronic non-cancer pain (CNCP).  Prescription of long-acting opioids for chronic noncancer pain was associated with a significantly increased risk of all-cause mortality, including deaths from causes other than overdose.  Reference: Von Korff M, Kolodny A, Deyo RA, Chou R. Long-term  opioid therapy reconsidered. Ann Intern Med. 2011 Sep 6;155(5):325-8. doi: 10.7326/[id number]. PMID: 78295621; PMCID: [id number]. Randon Goldsmith, Hayward RA, Dunn KM, [place] KP. Risk of adverse events in patients prescribed long-term opioids: A cohort study in the [place] [facility] Research [employer]. Eur J Pain. 2019 May;23(5):908-922. doi: 10.1002/ejp.1357. Epub 2019 Jan 31. PMID: 62952841. Colameco S, Coren JS, Ciervo CA. Continuous opioid treatment for chronic noncancer pain: a time for moderation in prescribing. Postgrad Med. 2009 Jul;121(4):61-6. doi: 10.3810/pgm.2009.07.2032. PMID: 32440102. William Hamburger RN, Baldwin SD, Blazina I, Cristopher Peru, Bougatsos C, Deyo RA. The effectiveness and risks of long-term opioid therapy for chronic pain: a systematic review for a [employer] of [employer] Pathways to [employer]. Ann Intern Med. 2015 Feb 17;162(4):276-86. doi: 10.7326/M14-2559. PMID: 72536644. Caryl Bis [facility], Makuc DM. NCHS Data Brief No. 22. [place]: Centers for Disease Control and Prevention; 2009. Sep, Increase in Fatal Poisonings Involving Opioid Analgesics in the [place], 1999-2006. Song IA, Choi HR, Oh TK. Long-term opioid use and mortality in patients with chronic non-cancer pain: Ten-year follow-up study in [place] from 2010 through 2019.  EClinicalMedicine. 2022 Jul 18;51:101558. doi: 10.1016/j.eclinm.2022.034742. PMID: 59563875; PMCID: IEP3295188. Huser, W., Schubert, T., Vogelmann, T. et al. All-cause mortality in patients with long-term opioid therapy compared with non-opioid analgesics for chronic non-cancer pain: a database study. BMC Med 18, 162 (2020). http://lester.info/ Rashidian H, Karie Kirks, Malekzadeh R, Haghdoost AA. An Ecological Study of the Association between Opiate Use and Incidence of Cancers. Addict Health. 2016 Fall;8(4):252-260. PMID: 41660630; PMCID: [id number].  Our Goal: Our goal is to control your pain with means other than the use of opioid pain medications.  Our Recommendation: Talk to your physician about coming off of these medications. We can assist you with the tapering down and stopping these medicines. Based on the new information, even if you cannot completely stop the medication, a decrease in the dose may be associated with a lesser risk. Ask for other means of controlling the pain. Decrease or eliminate those factors that significantly contribute to your pain such as smoking, obesity, and a diet heavily tilted towards "inflammatory" nutrients.  Last Updated: 08/28/2022   ____________________________________________________________________________________________     ____________________________________________________________________________________________  Transfer of Pain Medication between Pharmacies  Re: 2023 DEA Clarification on existing regulation  Published on DEA Website: March 01, 2022  Title: Revised Regulation Allows DEA-Registered Pharmacies to Electrical engineer Prescriptions at a Patient's Request DEA Headquarters Division - [employer]  "Patients now have the ability to request their electronic prescription be transferred to another pharmacy without having to go back to their practitioner to initiate the request.  This revised regulation went into effect on Monday, February 25, 2022.     At a patient's request, a DEA-registered retail pharmacy can now transfer an electronic prescription for a controlled substance (schedules II-V) to another DEA-registered retail pharmacy. Prior to this change, patients would have to go through their practitioner to cancel their prescription and have it re-issued to a different pharmacy. The process was taxing and time consuming for both patients and practitioners.    The Drug Enforcement Administration [facility]) published its intent to revise the process for transferring electronic prescriptions on May 19, 2020.  The final rule was published in the federal register on January 24, 2022 and went into effect 30 days later.  Under the final rule, a prescription can only be transferred once between pharmacies, and only if allowed under existing state  or other applicable law. The prescription must remain in its electronic form; may not be altered in any way; and the transfer must be communicated directly between two licensed pharmacists. It's important to note, any authorized refills transfer with the original prescription, which means the entire prescription will be filled at the same pharmacy."    REFERENCES: 1. DEA website announcement HugeHand.is  2. Department of Justice website  CheapWipes.at.pdf  3. DEPARTMENT OF JUSTICE Drug Enforcement Administration 21 CFR Part 1306 [Docket No. [id number]] RIN [id number] "Transfer of Electronic Prescriptions for Schedules II-V Controlled Substances Between Pharmacies for Initial Filling"  ____________________________________________________________________________________________     _______________________________________________________________________  Medication Rules  Purpose: To  inform patients, and their family members, of our medication rules and regulations.  Applies to: All patients receiving prescriptions from our practice (written or electronic).  Pharmacy of record: This is the pharmacy where your electronic prescriptions will be sent. Make sure we have the correct one.  Electronic prescriptions: In compliance with the [facility] Strengthen Opioid Misuse Prevention (STOP) Act of 2017 (Session Conni Elliot [phone]), effective July 01, 2018, all controlled substances must be electronically prescribed. Written prescriptions, faxing, or calling prescriptions to a pharmacy will no longer be done.  Prescription refills: These will be provided only during in-person appointments. No medications will be renewed without a "face-to-face" evaluation with your provider. Applies to all prescriptions.  NOTE: The following applies primarily to controlled substances (Opioid* Pain Medications).   Type of encounter (visit): For patients receiving controlled substances, face-to-face visits are required. (Not an option and not up to the patient.)  Patient's responsibilities: Pain Pills: Bring all pain pills to every appointment (except for procedure appointments). Pill Bottles: Bring pills in original pharmacy bottle. Bring bottle, even if empty. Always bring the bottle of the most recent fill.  Medication refills: You are responsible for knowing and keeping track of what medications you are taking and when is it that you will need a refill. The day before your appointment: write a list of all prescriptions that need to be refilled. The day of the appointment: give the list to the admitting nurse. Prescriptions will be written only during appointments. No prescriptions will be written on procedure days. If you forget a medication: it will not be "Called in", "Faxed", or "electronically sent". You will need to get another appointment to get these prescribed. No early refills. Do not  call asking to have your prescription filled early. Partial  or short prescriptions: Occasionally your pharmacy may not have enough pills to fill your prescription.  NEVER ACCEPT a partial fill or a prescription that is short of the total amount of pills that you were prescribed.  With controlled substances the law allows 72 hours for the pharmacy to complete the prescription.  If the prescription is not completed within 72 hours, the pharmacist will require a new prescription to be written. This means that you will be short on your medicine and we WILL NOT send another prescription to complete your original prescription.  Instead, request the pharmacy to send a carrier to a nearby branch to get enough medication to provide you with your full prescription. Prescription Accuracy: You are responsible for carefully inspecting your prescriptions before leaving our office. Have the discharge nurse carefully go over each prescription with you, before taking them home. Make sure that your name is accurately spelled, that your address is correct. Check the name and dose of your medication to make sure it is accurate. Check the number of  pills, and the written instructions to make sure they are clear and accurate. Make sure that you are given enough medication to last until your next medication refill appointment. Taking Medication: Take medication as prescribed. When it comes to controlled substances, taking less pills or less frequently than prescribed is permitted and encouraged. Never take more pills than instructed. Never take the medication more frequently than prescribed.  Inform other Doctors: Always inform, all of your healthcare providers, of all the medications you take. Pain Medication from other Providers: You are not allowed to accept any additional pain medication from any other Doctor or Healthcare provider. There are two exceptions to this rule. (see below) In the event that you require additional  pain medication, you are responsible for notifying us, as stated below. Cough Medicine: Often these contain an opioid, such as codeine or hydrocodone. Never accept or take cough medicine containing these opioids if you are already taking an opioid* medication. The combination may cause respiratory failure and death. Medication Agreement: You are responsible for carefully reading and following our Medication Agreement. This must be signed before receiving any prescriptions from our practice. Safely store a copy of your signed Agreement. Violations to the Agreement will result in no further prescriptions. (Additional copies of our Medication Agreement are available upon request.) Laws, Rules, & Regulations: All patients are expected to follow all [address] and [employer], Rules, [place] [employer] [place] [employer]. Ignorance of the Laws does not constitute a valid excuse.  Illegal drugs and Controlled Substances: The use of illegal substances (including, but not limited to marijuana and its derivatives) and/or the illegal use of any controlled substances is strictly prohibited. Violation of this rule may result in the immediate and permanent discontinuation of any and all prescriptions being written by our practice. The use of any illegal substances is prohibited. Adopted CDC guidelines & recommendations: Target dosing levels will be at or below 60 MME/day. Use of benzodiazepines** is not recommended.  Exceptions: There are only two exceptions to the rule of not receiving pain medications from other Healthcare Providers. Exception #1 (Emergencies): In the event of an emergency (i.e.: accident requiring emergency care), you are allowed to receive additional pain medication. However, you are responsible for: As soon as you are able, call our office [phone], at any time of the day or night, and leave a message stating your name, the date and nature of the emergency, and the name and dose of the medication prescribed. In  the event that your call is answered by a member of our staff, make sure to document and save the date, time, and the name of the person that took your information.  Exception #2 (Planned Surgery): In the event that you are scheduled by another doctor or dentist to have any type of surgery or procedure, you are allowed (for a period no longer than 30 days), to receive additional pain medication, for the acute post-op pain. However, in this case, you are responsible for picking up a copy of our "Post-op Pain Management for Surgeons" handout, and giving it to your surgeon or dentist. This document is available at our office, and does not require an appointment to obtain it. Simply go to our office during business hours (Monday-Thursday from 8:00 AM to 4:00 PM) (Friday 8:00 AM to 12:00 Noon) or if you have a scheduled appointment with [place], prior to your surgery, and ask for it by name. In addition, you are responsible for: calling our office [phone], at any time  of the day or night, and leaving a message stating your name, name of your surgeon, type of surgery, and date of procedure or surgery. Failure to comply with your responsibilities may result in termination of therapy involving the controlled substances. Medication Agreement Violation. Following the above rules, including your responsibilities will help you in avoiding a Medication Agreement Violation ("Breaking your Pain Medication Contract").  Consequences:  Not following the above rules may result in permanent discontinuation of medication prescription therapy.  *Opioid medications include: morphine, codeine, oxycodone, oxymorphone, hydrocodone, hydromorphone, meperidine, tramadol, tapentadol, buprenorphine, fentanyl, methadone. **Benzodiazepine medications include: diazepam (Valium), alprazolam (Xanax), clonazepam (Klonopine), lorazepam (Ativan), clorazepate (Tranxene), chlordiazepoxide (Librium), estazolam (Prosom), oxazepam (Serax), temazepam  (Restoril), triazolam (Halcion) (Last updated: 04/23/2022) ______________________________________________________________________    ______________________________________________________________________  Medication Recommendations and Reminders  Applies to: All patients receiving prescriptions (written and/or electronic).  Medication Rules & Regulations: You are responsible for reading, knowing, and following our "Medication Rules" document. These exist for your safety and that of others. They are not flexible and neither are we. Dismissing or ignoring them is an act of "non-compliance" that may result in complete and irreversible termination of such medication therapy. For safety reasons, "non-compliance" will not be tolerated. As with the [place] fundamental legal principle of "ignorance of the law is no defense", we will accept no excuses for not having read and knowing the content of documents provided to you by our practice.  Pharmacy of record:  Definition: This is the pharmacy where your electronic prescriptions will be sent.  We do not endorse any particular pharmacy. It is up to you and your insurance to decide what pharmacy to use.  We do not restrict you in your choice of pharmacy. However, once we write for your prescriptions, we will NOT be re-sending more prescriptions to fix restricted supply problems created by your pharmacy, or your insurance.  The pharmacy listed in the electronic medical record should be the one where you want electronic prescriptions to be sent. If you choose to change pharmacy, simply notify our nursing staff. Changes will be made only during your regular appointments and not over the phone.  Recommendations: Keep all of your pain medications in a safe place, under lock and key, even if you live alone. We will NOT replace lost, stolen, or damaged medication. We do not accept "Police Reports" as proof of medications having been stolen. After you fill your  prescription, take 1 week's worth of pills and put them away in a safe place. You should keep a separate, properly labeled bottle for this purpose. The remainder should be kept in the original bottle. Use this as your primary supply, until it runs out. Once it's gone, then you know that you have 1 week's worth of medicine, and it is time to come in for a prescription refill. If you do this correctly, it is unlikely that you will ever run out of medicine. To make sure that the above recommendation works, it is very important that you make sure your medication refill appointments are scheduled at least 1 week before you run out of medicine. To do this in an effective manner, make sure that you do not leave the office without scheduling your next medication management appointment. Always ask the nursing staff to show you in your prescription , when your medication will be running out. Then arrange for the receptionist to get you a return appointment, at least 7 days before you run out of medicine. Do not wait until you  have 1 or 2 pills left, to come in. This is very poor planning and does not take into consideration that we may need to cancel appointments due to bad weather, sickness, or emergencies affecting our staff. DO NOT ACCEPT A "Partial Fill": If for any reason your pharmacy does not have enough pills/tablets to completely fill or refill your prescription, do not allow for a "partial fill". The law allows the pharmacy to complete that prescription within 72 hours, without requiring a new prescription. If they do not fill the rest of your prescription within those 72 hours, you will need a separate prescription to fill the remaining amount, which we will NOT provide. If the reason for the partial fill is your insurance, you will need to talk to the pharmacist about payment alternatives for the remaining tablets, but again, DO NOT ACCEPT A PARTIAL FILL, unless you can trust your pharmacist to obtain the  remainder of the pills within 72 hours.  Prescription refills and/or changes in medication(s):  Prescription refills, and/or changes in dose or medication, will be conducted only during scheduled medication management appointments. (Applies to both, written and electronic prescriptions.) No refills on procedure days. No medication will be changed or started on procedure days. No changes, adjustments, and/or refills will be conducted on a procedure day. Doing so will interfere with the diagnostic portion of the procedure. No phone refills. No medications will be "called into the pharmacy". No Fax refills. No weekend refills. No Holliday refills. No after hours refills.  Remember:  Business hours are:  Monday to Thursday 8:00 AM to 4:00 PM Provider's Schedule: Delano Metz, MD - Appointments are:  Medication management: Monday and Wednesday 8:00 AM to 4:00 PM Procedure day: Tuesday and Thursday 7:30 AM to 4:00 PM Edward Jolly, MD - Appointments are:  Medication management: Tuesday and Thursday 8:00 AM to 4:00 PM Procedure day: Monday and Wednesday 7:30 AM to 4:00 PM (Last update: 04/23/2022) ______________________________________________________________________   ____________________________________________________________________________________________  Naloxone Nasal Spray  Why am I receiving this medication? [place] STOP ACT requires that all patients taking high dose opioids or at risk of opioids respiratory depression, be prescribed an opioid reversal agent, such as Naloxone (AKA: Narcan).  What is this medication? NALOXONE (nal OX one) treats opioid overdose, which causes slow or shallow breathing, severe drowsiness, or trouble staying awake. Call emergency services after using this medication. You may need additional treatment. Naloxone works by reversing the effects of opioids. It belongs to a group of medications called opioid blockers.  COMMON BRAND NAME(S):  Kloxxado, Narcan  What should I tell my care team before I take this medication? They need to know if you have any of these conditions: Heart disease Substance use disorder An unusual or allergic reaction to naloxone, other medications, foods, dyes, or preservatives Pregnant or trying to get pregnant Breast-feeding  When to use this medication? This medication is to be used for the treatment of respiratory depression (less than 8 breaths per minute) secondary to opioid overdose.   How to use this medication? This medication is for use in the nose. Lay the person on their back. Support their neck with your hand and allow the head to tilt back before giving the medication. The nasal spray should be given into 1 nostril. After giving the medication, move the person onto their side. Do not remove or test the nasal spray until ready to use. Get emergency medical help right away after giving the first dose of this medication, even  if the person wakes up. You should be familiar with how to recognize the signs and symptoms of a narcotic overdose. If more doses are needed, give the additional dose in the other nostril. Talk to your care team about the use of this medication in children. While this medication may be prescribed for children as young as newborns for selected conditions, precautions do apply.  Naloxone Overdosage: If you think you have taken too much of this medicine contact a poison control center or emergency room at once.  NOTE: This medicine is only for you. Do not share this medicine with others.  What if I miss a dose? This does not apply.  What may interact with this medication? This is only used during an emergency. No interactions are expected during emergency use. This list may not describe all possible interactions. Give your health care provider a list of all the medicines, herbs, non-prescription drugs, or dietary supplements you use. Also tell them if you smoke, drink  alcohol, or use illegal drugs. Some items may interact with your medicine.  What should I watch for while using this medication? Keep this medication ready for use in the case of an opioid overdose. Make sure that you have the phone number of your care team and local hospital ready. You may need to have additional doses of this medication. Each nasal spray contains a single dose. Some emergencies may require additional doses. After use, bring the treated person to the nearest hospital or call 911. Make sure the treating care team knows that the person has received a dose of this medication. You will receive additional instructions on what to do during and after use of this medication before an emergency occurs.  What side effects may I notice from receiving this medication? Side effects that you should report to your care team as soon as possible: Allergic reactions--skin rash, itching, hives, swelling of the face, lips, tongue, or throat Side effects that usually do not require medical attention (report these to your care team if they continue or are bothersome): Constipation Dryness or irritation inside the nose Headache Increase in blood pressure Muscle spasms Stuffy nose Toothache This list may not describe all possible side effects. Call your doctor for medical advice about side effects. You may report side effects to FDA at 1-800-FDA-1088.  Where should I keep my medication? Because this is an emergency medication, you should keep it with you at all times.  Keep out of the reach of children and pets. Store between 20 and 25 degrees C (68 and 77 degrees F). Do not freeze. Throw away any unused medication after the expiration date. Keep in original box until ready to use.  NOTE: This sheet is a summary. It may not cover all possible information. If you have questions about this medicine, talk to your doctor, pharmacist, or health care provider.   2023 [employer]/Gold Standard (2021-02-23  00:00:00)  ____________________________________________________________________________________________

## 2023-01-05 ENCOUNTER — Other Ambulatory Visit: Payer: Self-pay | Admitting: Internal Medicine

## 2023-01-05 ENCOUNTER — Other Ambulatory Visit: Payer: Self-pay | Admitting: Cardiovascular Disease

## 2023-01-06 ENCOUNTER — Encounter: Payer: Self-pay | Admitting: Internal Medicine

## 2023-01-06 ENCOUNTER — Ambulatory Visit (INDEPENDENT_AMBULATORY_CARE_PROVIDER_SITE_OTHER): Payer: Medicare Other | Admitting: Internal Medicine

## 2023-01-06 VITALS — BP 112/68 | HR 122 | Ht 60.0 in | Wt 133.6 lb

## 2023-01-06 DIAGNOSIS — R350 Frequency of micturition: Secondary | ICD-10-CM | POA: Diagnosis not present

## 2023-01-06 DIAGNOSIS — F411 Generalized anxiety disorder: Secondary | ICD-10-CM | POA: Diagnosis not present

## 2023-01-06 DIAGNOSIS — N390 Urinary tract infection, site not specified: Secondary | ICD-10-CM | POA: Diagnosis not present

## 2023-01-06 DIAGNOSIS — E782 Mixed hyperlipidemia: Secondary | ICD-10-CM | POA: Diagnosis not present

## 2023-01-06 LAB — POCT URINALYSIS DIPSTICK
Blood, UA: NEGATIVE
Glucose, UA: POSITIVE — AB
Leukocytes, UA: NEGATIVE
Nitrite, UA: POSITIVE
Protein, UA: POSITIVE — AB
Spec Grav, UA: 1.02 (ref 1.010–1.025)
Urobilinogen, UA: 1 E.U./dL
pH, UA: 5 (ref 5.0–8.0)

## 2023-01-06 LAB — TOXASSURE SELECT 13 (MW), URINE

## 2023-01-06 MED ORDER — CIPROFLOXACIN HCL 500 MG PO TABS
500.00 mg | ORAL_TABLET | Freq: Two times a day (BID) | ORAL | 0 refills | Status: AC
Start: 2023-01-06 — End: 2023-01-13

## 2023-01-06 NOTE — Progress Notes (Signed)
Established Patient Office Visit  Subjective:  Patient ID: Jacqueline Thornton, female    DOB: Jun 11, 1951  Age: 72 y.o. MRN: [id number]  Chief Complaint  Patient presents with   Acute Visit    Possible UTI    Patient comes in with 4 days history of dysuria, frequency and burning micturation. Also feels pain and fullness of bladder area. Patient missed her mammogram and labs - as her husband is not doing well. Urine dipstick in office is Positive for Nitrites,- will send in for culture and start Cipro empirically. Patient will return fasting for labs.    No other concerns at this time.   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   Atypical face pain 04/24/2015   Back pain    CAD (coronary artery disease)    pt denies   Cancer (HCC)    squamous cell- R temple    Chronic back pain    Constipation    Depression    Fibromyalgia    GERD (gastroesophageal reflux disease)    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) 04/11/2015   H/O: hysterectomy 1979   Heart murmur    Hyperlipidemia    Hypertension    Low back pain 04/06/2015   Lumbar radicular pain 04/18/2015   Migraine    Narrowing of intervertebral disc space 04/11/2015   Sacroiliac joint pain 04/11/2015   Spine disorder     Past Surgical History:  Procedure Laterality Date   ABDOMINAL EXPOSURE N/A 12/11/2018   Procedure: ABDOMINAL EXPOSURE;  Surgeon: Nada Libman, MD;  Location: [facility] OR;  Service: Vascular;  Laterality: N/A;   ABDOMINAL HYSTERECTOMY     ANTERIOR CERVICAL DECOMP/DISCECTOMY FUSION N/A 10/06/2020   Procedure: Cervical three -four Anterior cervical decompression/discectomy/fusion  with possible anchor c and exploration of fusion Cervical four to Six;  Surgeon: Maeola Harman, MD;  Location: [facility] OR;  Service: Neurosurgery;  Laterality:  N/A;   ANTERIOR LUMBAR FUSION N/A 12/11/2018   Procedure: Lumbar five Sacral one Anterior lumbar interbody fusion;  Surgeon: Maeola Harman, MD;  Location: [facility] OR;  Service: Neurosurgery;  Laterality: N/A;   APPENDECTOMY     Age19   caract surger     02/24/17   CHOLECYSTECTOMY     COLONOSCOPY WITH PROPOFOL N/A 07/19/2015   Procedure: COLONOSCOPY WITH PROPOFOL;  Surgeon: Scot Jun, MD;  Location: [facility] ENDOSCOPY;  Service: Endoscopy;  Laterality: N/A;   COLONOSCOPY WITH PROPOFOL N/A 09/11/2021   Procedure: COLONOSCOPY WITH PROPOFOL;  Surgeon: Regis Bill, MD;  Location: [facility] ENDOSCOPY;  Service: Endoscopy;  Laterality: N/A;   COLONOSCOPY WITH PROPOFOL N/A 03/05/2022   Procedure: COLONOSCOPY WITH PROPOFOL;  Surgeon: Regis Bill, MD;  Location: [facility] ENDOSCOPY;  Service: Endoscopy;  Laterality: N/A;   CYSTOSCOPY  03/03/2018   Procedure: CYSTOSCOPY;  Surgeon: Conard Novak, MD;  Location: [facility] ORS;  Service: Gynecology;;   ESOPHAGOGASTRODUODENOSCOPY (EGD) WITH PROPOFOL N/A 09/11/2021   Procedure: ESOPHAGOGASTRODUODENOSCOPY (EGD) WITH PROPOFOL;  Surgeon: Regis Bill, MD;  Location: [facility] ENDOSCOPY;  Service: Endoscopy;  Laterality: N/A;   LAPAROSCOPIC SALPINGO OOPHERECTOMY Bilateral 03/03/2018   Procedure: LAPAROSCOPIC SALPINGO OOPHORECTOMY;  Surgeon: Conard Novak, MD;  Location: [facility] ORS;  Service: Gynecology;  Laterality: Bilateral;   NECK SURGERY     Age 61 and 11.  TOE FUSION Right    TONSILLECTOMY     Age 65   TRANSFORAMINAL LUMBAR INTERBODY FUSION W/ MIS 1 LEVEL Right 04/30/2022   Procedure: Minimally Invasive  Decompression and  Transforaminal Lumbar Interbody Fusion Lumbar Four-Five;  Surgeon: Dawley, Alan Mulder, DO;  Location: [facility] OR;  Service: Neurosurgery;  Laterality: Right;  3C   TUMOR REMOVAL     Ovaries    Social History   Socioeconomic History   Marital status: Married    Spouse name: Not on file   Number of children: Not on file   Years  of education: Not on file   Highest education level: Not on file  Occupational History   Not on file  Tobacco Use   Smoking status: Former    Types: Cigarettes    Quit date: 07/02/2003    Years since quitting: 19.5   Smokeless tobacco: Never  Vaping Use   Vaping Use: Never used  Substance and Sexual Activity   Alcohol use: No    Alcohol/week: 0.0 standard drinks of alcohol   Drug use: No   Sexual activity: Not Currently  Other Topics Concern   Not on file  Social History Narrative   Not on file   Social Determinants of Health   Financial Resource Strain: Low Risk  (06/17/2017)   Overall Financial Resource Strain (CARDIA)    Difficulty of Paying Living Expenses: Not hard at all  Food Insecurity: No Food Insecurity (02/28/2022)   Hunger Vital Sign    Worried About Running Out of Food in the Last Year: Never true    Ran Out of Food in the Last Year: Never true  Transportation Needs: No Transportation Needs (02/28/2022)   PRAPARE - Administrator, Civil Service (Medical): No    Lack of Transportation (Non-Medical): No  Physical Activity: Unknown (06/17/2017)   Exercise Vital Sign    Days of Exercise per Week: Patient declined    Minutes of Exercise per Session: Patient declined  Stress: No Stress Concern Present (06/17/2017)   [employer] of Occupational Health - Occupational Stress Questionnaire    Feeling of Stress : Not at all  Social Connections: Unknown (06/17/2017)   Social Connection and Isolation Panel [NHANES]    Frequency of Communication with Friends and Family: Patient declined    Frequency of Social Gatherings with Friends and Family: Patient declined    Attends Religious Services: Patient declined    Database administrator or Organizations: Patient declined    Attends Banker Meetings: Patient declined    Marital Status: Patient declined  Intimate Partner Violence: Unknown (06/17/2017)   Humiliation, Afraid, Rape, and Kick  questionnaire    Fear of Current or Ex-Partner: Patient declined    Emotionally Abused: Patient declined    Physically Abused: Patient declined    Sexually Abused: Patient declined    Family History  Problem Relation Age of Onset   Diabetes Father    Hypertension Sister    Diabetes Mother    Stroke Mother    Colon cancer Mother    Colon cancer Maternal Grandmother    Ovarian cancer Neg Hx    Breast cancer Neg Hx     No Known Allergies  Review of Systems  Constitutional:  Positive for chills and malaise/fatigue.  HENT: Negative.    Eyes: Negative.  Negative for blurred vision.  Respiratory: Negative.  Negative for cough, shortness of breath and wheezing.   Cardiovascular: Negative.  Negative for chest pain,  palpitations and leg swelling.  Gastrointestinal: Negative.  Negative for abdominal pain, constipation, diarrhea, heartburn, nausea and vomiting.  Genitourinary:  Positive for dysuria, flank pain, frequency and urgency. Negative for hematuria.  Musculoskeletal:  Positive for joint pain and myalgias.  Skin: Negative.   Neurological: Negative.  Negative for dizziness and headaches.  Endo/Heme/Allergies: Negative.   Psychiatric/Behavioral:  Positive for depression. Negative for suicidal ideas. The patient is not nervous/anxious.        Objective:   BP 112/68   Pulse (!) 122   Ht 5' (1.524 m)   Wt 133 lb 9.6 oz (60.6 kg)   SpO2 93%   BMI 26.09 kg/m   Vitals:   01/06/23 1449  BP: 112/68  Pulse: (!) 122  Height: 5' (1.524 m)  Weight: 133 lb 9.6 oz (60.6 kg)  SpO2: 93%  BMI (Calculated): 26.09    Physical Exam Vitals and nursing note reviewed.  Constitutional:      General: She is not in acute distress.    Appearance: Normal appearance.  HENT:     Head: Normocephalic and atraumatic.     Nose: Nose normal.     Mouth/Throat:     Mouth: Mucous membranes are moist.     Pharynx: Oropharynx is clear.  Eyes:     Conjunctiva/sclera: Conjunctivae normal.      Pupils: Pupils are equal, round, and reactive to light.  Cardiovascular:     Rate and Rhythm: Normal rate and regular rhythm.     Pulses: Normal pulses.     Heart sounds: Normal heart sounds. No murmur heard. Pulmonary:     Effort: Pulmonary effort is normal.     Breath sounds: Normal breath sounds. No wheezing.  Abdominal:     General: Bowel sounds are normal.     Palpations: Abdomen is soft.     Tenderness: There is no abdominal tenderness. There is right CVA tenderness. There is no left CVA tenderness.  Musculoskeletal:        General: Normal range of motion.     Cervical back: Normal range of motion.     Right lower leg: No edema.     Left lower leg: No edema.  Skin:    General: Skin is warm and dry.  Neurological:     General: No focal deficit present.     Mental Status: She is alert and oriented to person, place, and time.  Psychiatric:        Mood and Affect: Mood normal.        Behavior: Behavior normal.      Results for orders placed or performed in visit on 01/06/23  POCT Urinalysis Dipstick (81002)  Result Value Ref Range   Color, UA red    Clarity, UA cloudy    Glucose, UA Positive (A) Negative   Bilirubin, UA small    Ketones, UA trace    Spec Grav, UA 1.020 1.010 - 1.025   Blood, UA negative    pH, UA 5.0 5.0 - 8.0   Protein, UA Positive (A) Negative   Urobilinogen, UA 1.0 0.2 or 1.0 E.U./dL   Nitrite, UA positive    Leukocytes, UA Negative Negative   Appearance cloudy    Odor yes     Recent Results (from the past 2160 hour(s))  ToxASSURE Select 13 (MW), Urine     Status: None (Preliminary result)   Collection Time: 01/01/23  3:29 PM  Result Value Ref Range   Summary WILL FOLLOW   POCT  Urinalysis Dipstick (16109)     Status: Abnormal   Collection Time: 01/06/23  3:08 PM  Result Value Ref Range   Color, UA red    Clarity, UA cloudy    Glucose, UA Positive (A) Negative   Bilirubin, UA small    Ketones, UA trace    Spec Grav, UA 1.020 1.010 -  1.025   Blood, UA negative    pH, UA 5.0 5.0 - 8.0   Protein, UA Positive (A) Negative   Urobilinogen, UA 1.0 0.2 or 1.0 E.U./dL   Nitrite, UA positive    Leukocytes, UA Negative Negative   Appearance cloudy    Odor yes       Assessment & Plan:  Start po Cipro, increase fluids. Problem List Items Addressed This Visit     Hyperlipidemia   Anxiety, generalized   Other Visit Diagnoses     Urinary tract infection without hematuria, site unspecified    -  Primary   Relevant Orders   Urine Culture   Urinary frequency       Relevant Medications   ciprofloxacin (CIPRO) 500 MG tablet   Other Relevant Orders   POCT Urinalysis Dipstick (60454) (Completed)       Return in about 1 week (around 01/13/2023).   Total time spent: 25 minutes  Margaretann Loveless, MD  01/06/2023   This document may have been prepared by [facility] Voice Recognition software and as such may include unintentional dictation errors.

## 2023-01-08 LAB — URINE CULTURE

## 2023-01-09 ENCOUNTER — Ambulatory Visit: Payer: Medicare Other | Admitting: Pain Medicine

## 2023-01-13 ENCOUNTER — Encounter: Payer: Self-pay | Admitting: Internal Medicine

## 2023-01-13 ENCOUNTER — Ambulatory Visit (INDEPENDENT_AMBULATORY_CARE_PROVIDER_SITE_OTHER): Payer: Medicare Other | Admitting: Internal Medicine

## 2023-01-13 VITALS — BP 102/58 | HR 75 | Ht 60.0 in | Wt 135.4 lb

## 2023-01-13 DIAGNOSIS — Z1231 Encounter for screening mammogram for malignant neoplasm of breast: Secondary | ICD-10-CM

## 2023-01-13 DIAGNOSIS — N390 Urinary tract infection, site not specified: Secondary | ICD-10-CM

## 2023-01-13 DIAGNOSIS — E782 Mixed hyperlipidemia: Secondary | ICD-10-CM

## 2023-01-13 DIAGNOSIS — E559 Vitamin D deficiency, unspecified: Secondary | ICD-10-CM

## 2023-01-13 DIAGNOSIS — I10 Essential (primary) hypertension: Secondary | ICD-10-CM

## 2023-01-13 DIAGNOSIS — R350 Frequency of micturition: Secondary | ICD-10-CM

## 2023-01-13 DIAGNOSIS — R739 Hyperglycemia, unspecified: Secondary | ICD-10-CM | POA: Insufficient documentation

## 2023-01-13 LAB — POCT URINALYSIS DIPSTICK
Blood, UA: NEGATIVE
Glucose, UA: NEGATIVE
Leukocytes, UA: NEGATIVE
Nitrite, UA: NEGATIVE
Protein, UA: POSITIVE — AB
Spec Grav, UA: >=1.03 — AB (ref 1.010–1.025)
Urobilinogen, UA: 0.2 E.U./dL
pH, UA: 5.5 (ref 5.0–8.0)

## 2023-01-13 NOTE — Progress Notes (Signed)
Established Patient Office Visit  Subjective:  Patient ID: Jacqueline Thornton, female    DOB: December 29, 1950  Age: 72 y.o. MRN: [id number]  Chief Complaint  Patient presents with   Follow-up    1 week follow up    Patient comes in for her follow-up today.  She was treated for UTI at her last visit.  Her urine culture had grown Klebsiella which was sensitive to Cipro.  Today patient feels much better.  No further complaints of dysuria or burning micturition. Her repeat urine dipstick in office is also clear. Patient is fasting for blood work.      No other concerns at this time.   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   Atypical face pain 04/24/2015   Back pain    CAD (coronary artery disease)    pt denies   Cancer (HCC)    squamous cell- R temple    Chronic back pain    Constipation    Depression    Fibromyalgia    GERD (gastroesophageal reflux disease)    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) 04/11/2015   H/O: hysterectomy 1979   Heart murmur    Hyperlipidemia    Hypertension    Low back pain 04/06/2015   Lumbar radicular pain 04/18/2015   Migraine    Narrowing of intervertebral disc space 04/11/2015   Sacroiliac joint pain 04/11/2015   Spine disorder     Past Surgical History:  Procedure Laterality Date   ABDOMINAL EXPOSURE N/A 12/11/2018   Procedure: ABDOMINAL EXPOSURE;  Surgeon: Nada Libman, MD;  Location: [facility] OR;  Service: Vascular;  Laterality: N/A;   ABDOMINAL HYSTERECTOMY     ANTERIOR CERVICAL DECOMP/DISCECTOMY FUSION N/A 10/06/2020   Procedure: Cervical three -four Anterior cervical decompression/discectomy/fusion  with possible anchor c and exploration of fusion Cervical four to Six;  Surgeon: Maeola Harman, MD;  Location: [facility] OR;  Service: Neurosurgery;  Laterality: N/A;    ANTERIOR LUMBAR FUSION N/A 12/11/2018   Procedure: Lumbar five Sacral one Anterior lumbar interbody fusion;  Surgeon: Maeola Harman, MD;  Location: [facility] OR;  Service: Neurosurgery;  Laterality: N/A;   APPENDECTOMY     Age19   caract surger     02/24/17   CHOLECYSTECTOMY     COLONOSCOPY WITH PROPOFOL N/A 07/19/2015   Procedure: COLONOSCOPY WITH PROPOFOL;  Surgeon: Scot Jun, MD;  Location: [facility] ENDOSCOPY;  Service: Endoscopy;  Laterality: N/A;   COLONOSCOPY WITH PROPOFOL N/A 09/11/2021   Procedure: COLONOSCOPY WITH PROPOFOL;  Surgeon: Regis Bill, MD;  Location: [facility] ENDOSCOPY;  Service: Endoscopy;  Laterality: N/A;   COLONOSCOPY WITH PROPOFOL N/A 03/05/2022   Procedure: COLONOSCOPY WITH PROPOFOL;  Surgeon: Regis Bill, MD;  Location: [facility] ENDOSCOPY;  Service: Endoscopy;  Laterality: N/A;   CYSTOSCOPY  03/03/2018   Procedure: CYSTOSCOPY;  Surgeon: Conard Novak, MD;  Location: [facility] ORS;  Service: Gynecology;;   ESOPHAGOGASTRODUODENOSCOPY (EGD) WITH PROPOFOL N/A 09/11/2021   Procedure: ESOPHAGOGASTRODUODENOSCOPY (EGD) WITH PROPOFOL;  Surgeon: Regis Bill, MD;  Location: [facility] ENDOSCOPY;  Service: Endoscopy;  Laterality: N/A;   LAPAROSCOPIC SALPINGO OOPHERECTOMY Bilateral 03/03/2018   Procedure: LAPAROSCOPIC SALPINGO OOPHORECTOMY;  Surgeon: Conard Novak, MD;  Location: [facility] ORS;  Service: Gynecology;  Laterality: Bilateral;   NECK SURGERY  Age 28 and 62.   TOE FUSION Right    TONSILLECTOMY     Age 28   TRANSFORAMINAL LUMBAR INTERBODY FUSION W/ MIS 1 LEVEL Right 04/30/2022   Procedure: Minimally Invasive  Decompression and  Transforaminal Lumbar Interbody Fusion Lumbar Four-Five;  Surgeon: Dawley, Alan Mulder, DO;  Location: [facility] OR;  Service: Neurosurgery;  Laterality: Right;  3C   TUMOR REMOVAL     Ovaries    Social History   Socioeconomic History   Marital status: Married    Spouse name: Not on file   Number of children: Not on file   Years of  education: Not on file   Highest education level: Not on file  Occupational History   Not on file  Tobacco Use   Smoking status: Former    Current packs/day: 0.00    Types: Cigarettes    Quit date: 07/02/2003    Years since quitting: 19.5   Smokeless tobacco: Never  Vaping Use   Vaping status: Never Used  Substance and Sexual Activity   Alcohol use: No    Alcohol/week: 0.0 standard drinks of alcohol   Drug use: No   Sexual activity: Not Currently  Other Topics Concern   Not on file  Social History Narrative   Not on file   Social Determinants of Health   Financial Resource Strain: Low Risk  (06/17/2017)   Overall Financial Resource Strain (CARDIA)    Difficulty of Paying Living Expenses: Not hard at all  Food Insecurity: No Food Insecurity (02/28/2022)   Hunger Vital Sign    Worried About Running Out of Food in the Last Year: Never true    Ran Out of Food in the Last Year: Never true  Transportation Needs: No Transportation Needs (02/28/2022)   PRAPARE - Administrator, Civil Service (Medical): No    Lack of Transportation (Non-Medical): No  Physical Activity: Unknown (06/17/2017)   Exercise Vital Sign    Days of Exercise per Week: Patient declined    Minutes of Exercise per Session: Patient declined  Stress: No Stress Concern Present (06/17/2017)   [employer] of Occupational Health - Occupational Stress Questionnaire    Feeling of Stress : Not at all  Social Connections: Unknown (06/17/2017)   Social Connection and Isolation Panel [NHANES]    Frequency of Communication with Friends and Family: Patient declined    Frequency of Social Gatherings with Friends and Family: Patient declined    Attends Religious Services: Patient declined    Database administrator or Organizations: Patient declined    Attends Banker Meetings: Patient declined    Marital Status: Patient declined  Intimate Partner Violence: Unknown (06/17/2017)   Humiliation,  Afraid, Rape, and Kick questionnaire    Fear of Current or Ex-Partner: Patient declined    Emotionally Abused: Patient declined    Physically Abused: Patient declined    Sexually Abused: Patient declined    Family History  Problem Relation Age of Onset   Diabetes Father    Hypertension Sister    Diabetes Mother    Stroke Mother    Colon cancer Mother    Colon cancer Maternal Grandmother    Ovarian cancer Neg Hx    Breast cancer Neg Hx     No Known Allergies  Review of Systems  Constitutional: Negative.  Negative for chills, fever, malaise/fatigue and weight loss.  HENT: Negative.    Eyes: Negative.   Respiratory: Negative.  Negative for cough and shortness  of breath.   Cardiovascular: Negative.  Negative for chest pain, palpitations and leg swelling.  Gastrointestinal: Negative.  Negative for abdominal pain, constipation, diarrhea, heartburn, nausea and vomiting.  Genitourinary: Negative.  Negative for dysuria, flank pain, frequency, hematuria and urgency.  Musculoskeletal: Negative.  Negative for joint pain and myalgias.  Skin: Negative.   Neurological: Negative.  Negative for dizziness and headaches.  Endo/Heme/Allergies: Negative.   Psychiatric/Behavioral: Negative.  Negative for depression and suicidal ideas. The patient is not nervous/anxious.        Objective:   BP (!) 102/58   Pulse 75   Ht 5' (1.524 m)   Wt 135 lb 6.4 oz (61.4 kg)   SpO2 96%   BMI 26.44 kg/m   Vitals:   01/13/23 1051  BP: (!) 102/58  Pulse: 75  Height: 5' (1.524 m)  Weight: 135 lb 6.4 oz (61.4 kg)  SpO2: 96%  BMI (Calculated): 26.44    Physical Exam Vitals and nursing note reviewed.  Constitutional:      Appearance: Normal appearance.  HENT:     Head: Normocephalic and atraumatic.     Nose: Nose normal.     Mouth/Throat:     Mouth: Mucous membranes are moist.     Pharynx: Oropharynx is clear.  Eyes:     Conjunctiva/sclera: Conjunctivae normal.     Pupils: Pupils are equal,  round, and reactive to light.  Cardiovascular:     Rate and Rhythm: Normal rate and regular rhythm.     Pulses: Normal pulses.     Heart sounds: Normal heart sounds. No murmur heard. Pulmonary:     Effort: Pulmonary effort is normal.     Breath sounds: Normal breath sounds. No wheezing.  Abdominal:     General: Bowel sounds are normal.     Palpations: Abdomen is soft.     Tenderness: There is no abdominal tenderness. There is no right CVA tenderness or left CVA tenderness.  Musculoskeletal:        General: Normal range of motion.     Cervical back: Normal range of motion.     Right lower leg: No edema.     Left lower leg: No edema.  Skin:    General: Skin is warm and dry.  Neurological:     General: No focal deficit present.     Mental Status: She is alert and oriented to person, place, and time.  Psychiatric:        Mood and Affect: Mood normal.        Behavior: Behavior normal.      Results for orders placed or performed in visit on 01/13/23  POCT Urinalysis Dipstick (81002)  Result Value Ref Range   Color, UA orange    Clarity, UA cloudy    Glucose, UA Negative Negative   Bilirubin, UA small    Ketones, UA trace    Spec Grav, UA >=1.030 (A) 1.010 - 1.025   Blood, UA neg    pH, UA 5.5 5.0 - 8.0   Protein, UA Positive (A) Negative   Urobilinogen, UA 0.2 0.2 or 1.0 E.U./dL   Nitrite, UA neg    Leukocytes, UA Negative Negative   Appearance cloudy    Odor yes     Recent Results (from the past 2160 hour(s))  [employer] Select 13 (MW), Urine     Status: None   Collection Time: 01/01/23  3:29 PM  Result Value Ref Range   Summary Note     Comment: ====================================================================  [employer] Select 13 (MW) ==================================================================== Test                             Result       Flag       Units  Drug Present and Declared for Prescription Verification   Oxycodone                      7012          EXPECTED   ng/mg creat   Oxymorphone                    2293         EXPECTED   ng/mg creat   Noroxycodone                   9132         EXPECTED   ng/mg creat   Noroxymorphone                 590          EXPECTED   ng/mg creat    Sources of oxycodone are scheduled prescription medications.    Oxymorphone, noroxycodone, and noroxymorphone are expected    metabolites of oxycodone. Oxymorphone is also available as a    scheduled prescription medication.  ==================================================================== Test                      Result    Flag   Units      Ref Range   Creatinine              41               mg/dL      >=53 == ================================================================== Declared Medications:  The flagging and interpretation on this report are based on the  following declared medications.  Unexpected results may arise from  inaccuracies in the declared medications.   **Note: The testing scope of this panel includes these medications:   Oxycodone (Percocet)   **Note: The testing scope of this panel does not include the  following reported medications:   Acetaminophen (Percocet)  Atorvastatin (Lipitor)  Calcium  Cyanocobalamin  Docusate (Colace)  Duloxetine (Cymbalta)  Enalapril (Vasotec)  Estradiol (Estrace)  Furosemide (Lasix)  Gabapentin (Neurontin)  Magnesium  Melatonin  Methocarbamol (Robaxin)  Multivitamin  Naloxone (Narcan)  Omeprazole (Prilosec)  Spironolactone (Aldactone)  Supplement  Vitamin D ==================================================================== For clinical consultation, please call [phone]. ============================== ======================================   POCT Urinalysis Dipstick (59563)     Status: Abnormal   Collection Time: 01/06/23  3:08 PM  Result Value Ref Range   Color, UA red    Clarity, UA cloudy    Glucose, UA Positive (A) Negative   Bilirubin, UA small    Ketones, UA  trace    Spec Grav, UA 1.020 1.010 - 1.025   Blood, UA negative    pH, UA 5.0 5.0 - 8.0   Protein, UA Positive (A) Negative   Urobilinogen, UA 1.0 0.2 or 1.0 E.U./dL   Nitrite, UA positive    Leukocytes, UA Negative Negative   Appearance cloudy    Odor yes   Urine Culture     Status: Abnormal   Collection Time: 01/06/23  3:51 PM   Specimen: Urine   UC  Result Value Ref Range   Urine Culture, Routine Final report (A)  Organism ID, Bacteria Klebsiella pneumoniae (A)     Comment: Cefazolin <=4 ug/mL Cefazolin with an MIC <=16 predicts susceptibility to the oral agents cefaclor, cefdinir, cefpodoxime, cefprozil, cefuroxime, cephalexin, and loracarbef when used for therapy of uncomplicated urinary tract infections due to E. coli, Klebsiella pneumoniae, and Proteus mirabilis. Greater than 100,000 colony forming units per mL    Antimicrobial Susceptibility Comment     Comment:       ** S = Susceptible; I = Intermediate; R = Resistant **                    P = Positive; N = Negative             MICS are expressed in micrograms per mL    Antibiotic                 RSLT#1    RSLT#2    RSLT#3    RSLT#4 Amoxicillin/Clavulanic Acid    S Ampicillin                     R Cefepime                       S Ceftriaxone                    S Cefuroxime                     S Ciprofloxacin                  S Ertapenem                      S Gentamicin                     S Imipenem                       S Levofloxacin                   S Meropenem                      S Nitrofurantoin                 S Piperacillin/Tazobactam        S Tetracycline                   S Tobramycin                     S Trimethoprim/Sulfa             S   POCT Urinalysis Dipstick ([id number])     Status: Abnormal   Collection Time: 01/13/23 11:01 AM  Result Value Ref Range   Color, UA orange    Clarity, UA cloudy    Glucose, UA Negative Negative   Bilirubin, UA small    Ketones, UA trace    Spec Grav, UA  >=1.030 (A) 1.010 - 1.025   Blood, UA neg    pH, UA 5.5 5.0 - 8.0   Protein, UA Positive (A) Negative   Urobilinogen, UA 0.2 0.2 or 1.0 E.U./dL   Nitrite, UA neg    Leukocytes, UA Negative Negative   Appearance cloudy    Odor yes       Assessment & Plan:  Fasting  labs today.  Continue all medications.  Patient encouraged to drink more water. Problem List Items Addressed This Visit     Hyperlipidemia   Relevant Orders   CMP14+EGFR   Lipid Panel w/o Chol/HDL Ratio   Essential hypertension, benign   Hyperglycemia   Relevant Orders   Hemoglobin A1c   Vitamin D deficiency   Relevant Orders   Vitamin D (25 hydroxy)   Other Visit Diagnoses     Urinary tract infection without hematuria, site unspecified    -  Primary   Relevant Orders   POCT Urinalysis Dipstick (40981) (Completed)   Urinary frequency       Relevant Orders   CBC With Differential   Breast cancer screening by mammogram           Return in about 3 months (around 04/15/2023).   Total time spent: 25 minutes  Margaretann Loveless, MD  01/13/2023   This document may have been prepared by [facility] Voice Recognition software and as such may include unintentional dictation errors.

## 2023-01-14 LAB — CMP14+EGFR
ALT: 11 IU/L (ref 0–32)
AST: 15 IU/L (ref 0–40)
Albumin: 3.7 g/dL — ABNORMAL LOW (ref 3.9–4.9)
Alkaline Phosphatase: 52 IU/L (ref 44–121)
BUN/Creatinine Ratio: 12 (ref 12–28)
BUN: 12 mg/dL (ref 8–27)
Bilirubin Total: 0.3 mg/dL (ref 0.0–1.2)
CO2: 26 mmol/L (ref 20–29)
Calcium: 9.1 mg/dL (ref 8.7–10.3)
Chloride: 103 mmol/L (ref 96–106)
Creatinine, Ser: 1 mg/dL (ref 0.57–1.00)
Globulin, Total: 1.7 g/dL (ref 1.5–4.5)
Glucose: 98 mg/dL (ref 70–99)
Potassium: 4.4 mmol/L (ref 3.5–5.2)
Sodium: 141 mmol/L (ref 134–144)
Total Protein: 5.4 g/dL — ABNORMAL LOW (ref 6.0–8.5)
eGFR: 61 mL/min/{1.73_m2} (ref >59)

## 2023-01-14 LAB — VITAMIN D 25 HYDROXY (VIT D DEFICIENCY, FRACTURES): Vit D, 25-Hydroxy: 33 ng/mL (ref 30.0–100.0)

## 2023-01-14 LAB — CBC WITH DIFFERENTIAL
Basophils Absolute: 0 10*3/uL (ref 0.0–0.2)
Basos: 0 %
EOS (ABSOLUTE): 0.2 10*3/uL (ref 0.0–0.4)
Eos: 3 %
Hematocrit: 38.2 % (ref 34.0–46.6)
Hemoglobin: 12.3 g/dL (ref 11.1–15.9)
Immature Grans (Abs): 0 10*3/uL (ref 0.0–0.1)
Immature Granulocytes: 0 %
Lymphocytes Absolute: 1.5 10*3/uL (ref 0.7–3.1)
Lymphs: 33 %
MCH: 31.7 pg (ref 26.6–33.0)
MCHC: 32.2 g/dL (ref 31.5–35.7)
MCV: 99 fL — ABNORMAL HIGH (ref 79–97)
Monocytes Absolute: 0.3 10*3/uL (ref 0.1–0.9)
Monocytes: 7 %
Neutrophils Absolute: 2.6 10*3/uL (ref 1.4–7.0)
Neutrophils: 57 %
RBC: 3.88 x10E6/uL (ref 3.77–5.28)
RDW: 12.6 % (ref 11.7–15.4)
WBC: 4.6 10*3/uL (ref 3.4–10.8)

## 2023-01-14 LAB — HEMOGLOBIN A1C
Est. average glucose Bld gHb Est-mCnc: 105 mg/dL
Hgb A1c MFr Bld: 5.3 % (ref 4.8–5.6)

## 2023-01-14 LAB — LIPID PANEL W/O CHOL/HDL RATIO
Cholesterol, Total: 157 mg/dL (ref 100–199)
HDL: 51 mg/dL (ref >39)
LDL Chol Calc (NIH): 85 mg/dL (ref 0–99)
Triglycerides: 120 mg/dL (ref 0–149)
VLDL Cholesterol Cal: 21 mg/dL (ref 5–40)

## 2023-01-21 NOTE — Progress Notes (Signed)
Spoke with pt DOB verified and verbalized understanding.

## 2023-01-22 ENCOUNTER — Other Ambulatory Visit: Payer: Self-pay | Admitting: Surgery

## 2023-01-22 DIAGNOSIS — M5416 Radiculopathy, lumbar region: Secondary | ICD-10-CM

## 2023-02-25 ENCOUNTER — Encounter: Payer: Self-pay | Admitting: Internal Medicine

## 2023-02-25 ENCOUNTER — Ambulatory Visit (INDEPENDENT_AMBULATORY_CARE_PROVIDER_SITE_OTHER): Payer: Medicare Other | Admitting: Internal Medicine

## 2023-02-25 VITALS — BP 128/60 | HR 83 | Temp 96.6°F | Ht 60.0 in | Wt 137.4 lb

## 2023-02-25 DIAGNOSIS — E782 Mixed hyperlipidemia: Secondary | ICD-10-CM

## 2023-02-25 DIAGNOSIS — R509 Fever, unspecified: Secondary | ICD-10-CM

## 2023-02-25 DIAGNOSIS — B349 Viral infection, unspecified: Secondary | ICD-10-CM

## 2023-02-25 DIAGNOSIS — I10 Essential (primary) hypertension: Secondary | ICD-10-CM

## 2023-02-25 DIAGNOSIS — R300 Dysuria: Secondary | ICD-10-CM | POA: Diagnosis not present

## 2023-02-25 DIAGNOSIS — R6889 Other general symptoms and signs: Secondary | ICD-10-CM

## 2023-02-25 LAB — POCT XPERT XPRESS SARS COVID-2/FLU/RSV
FLU A: NEGATIVE
FLU B: NEGATIVE
RSV RNA, PCR: NEGATIVE
SARS Coronavirus 2: NEGATIVE

## 2023-02-25 LAB — POCT URINALYSIS DIPSTICK
Bilirubin, UA: NEGATIVE
Blood, UA: NEGATIVE
Glucose, UA: NEGATIVE
Ketones, UA: NEGATIVE
Nitrite, UA: NEGATIVE
Protein, UA: NEGATIVE
Spec Grav, UA: 1.025 (ref 1.010–1.025)
Urobilinogen, UA: 0.2 E.U./dL
pH, UA: 5.5 (ref 5.0–8.0)

## 2023-02-25 NOTE — Progress Notes (Signed)
Patient notified

## 2023-02-25 NOTE — Progress Notes (Signed)
Established Patient Office Visit  Subjective:  Patient ID: Jacqueline Thornton, female    DOB: 11-17-1950  Age: 72 y.o. MRN: [id number]  Chief Complaint  Patient presents with   Acute Visit    Fever, nausea, HA, dysuria and urinary frequency.    Patient comes in with few days history of feeling tired, back pain, headache, chills and feverish.  She felt mildly nauseous, no vomiting and no diarrhea. She does not have a cough or sore throat.  Initially felt that she could be having a urinary tract infection but the urine dipstick in office is relatively unremarkable with only trace of pus cells. COVID/flu/RSV tests are also negative. Suspect viral syndrome. Needs to rest, drink plenty of fluids and take Tylenol as needed.    No other concerns at this time.   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   Atypical face pain 04/24/2015   Back pain    CAD (coronary artery disease)    pt denies   Cancer (HCC)    squamous cell- R temple    Chronic back pain    Constipation    Depression    Fibromyalgia    GERD (gastroesophageal reflux disease)    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) 04/11/2015   H/O: hysterectomy 1979   Heart murmur    Hyperlipidemia    Hypertension    Low back pain 04/06/2015   Lumbar radicular pain 04/18/2015   Migraine    Narrowing of intervertebral disc space 04/11/2015   Sacroiliac joint pain 04/11/2015   Spine disorder     Past Surgical History:  Procedure Laterality Date   ABDOMINAL EXPOSURE N/A 12/11/2018   Procedure: ABDOMINAL EXPOSURE;  Surgeon: Nada Libman, MD;  Location: [facility] OR;  Service: Vascular;  Laterality: N/A;   ABDOMINAL HYSTERECTOMY     ANTERIOR CERVICAL DECOMP/DISCECTOMY FUSION N/A 10/06/2020   Procedure: Cervical three -four Anterior cervical  decompression/discectomy/fusion  with possible anchor c and exploration of fusion Cervical four to Six;  Surgeon: Maeola Harman, MD;  Location: [facility] OR;  Service: Neurosurgery;  Laterality: N/A;   ANTERIOR LUMBAR FUSION N/A 12/11/2018   Procedure: Lumbar five Sacral one Anterior lumbar interbody fusion;  Surgeon: Maeola Harman, MD;  Location: [facility] OR;  Service: Neurosurgery;  Laterality: N/A;   APPENDECTOMY     Age19   caract surger     02/24/17   CHOLECYSTECTOMY     COLONOSCOPY WITH PROPOFOL N/A 07/19/2015   Procedure: COLONOSCOPY WITH PROPOFOL;  Surgeon: Scot Jun, MD;  Location: [facility] ENDOSCOPY;  Service: Endoscopy;  Laterality: N/A;   COLONOSCOPY WITH PROPOFOL N/A 09/11/2021   Procedure: COLONOSCOPY WITH PROPOFOL;  Surgeon: Regis Bill, MD;  Location: [facility] ENDOSCOPY;  Service: Endoscopy;  Laterality: N/A;   COLONOSCOPY WITH PROPOFOL N/A 03/05/2022   Procedure: COLONOSCOPY WITH PROPOFOL;  Surgeon: Regis Bill, MD;  Location: [facility] ENDOSCOPY;  Service: Endoscopy;  Laterality: N/A;   CYSTOSCOPY  03/03/2018   Procedure: CYSTOSCOPY;  Surgeon: Conard Novak, MD;  Location: [facility] ORS;  Service: Gynecology;;   ESOPHAGOGASTRODUODENOSCOPY (EGD) WITH PROPOFOL N/A 09/11/2021   Procedure: ESOPHAGOGASTRODUODENOSCOPY (EGD) WITH PROPOFOL;  Surgeon: Regis Bill, MD;  Location: [facility] ENDOSCOPY;  Service: Endoscopy;  Laterality: N/A;   LAPAROSCOPIC SALPINGO OOPHERECTOMY Bilateral 03/03/2018   Procedure: LAPAROSCOPIC SALPINGO  OOPHORECTOMY;  Surgeon: Conard Novak, MD;  Location: [facility] ORS;  Service: Gynecology;  Laterality: Bilateral;   NECK SURGERY     Age 28 and 88.   TOE FUSION Right    TONSILLECTOMY     Age 68   TRANSFORAMINAL LUMBAR INTERBODY FUSION W/ MIS 1 LEVEL Right 04/30/2022   Procedure: Minimally Invasive  Decompression and  Transforaminal Lumbar Interbody Fusion Lumbar Four-Five;  Surgeon: Dawley, Alan Mulder, DO;  Location: [facility] OR;  Service: Neurosurgery;  Laterality:  Right;  3C   TUMOR REMOVAL     Ovaries    Social History   Socioeconomic History   Marital status: Married    Spouse name: Not on file   Number of children: Not on file   Years of education: Not on file   Highest education level: Not on file  Occupational History   Not on file  Tobacco Use   Smoking status: Former    Current packs/day: 0.00    Types: Cigarettes    Quit date: 07/02/2003    Years since quitting: 19.6   Smokeless tobacco: Never  Vaping Use   Vaping status: Never Used  Substance and Sexual Activity   Alcohol use: No    Alcohol/week: 0.0 standard drinks of alcohol   Drug use: No   Sexual activity: Not Currently  Other Topics Concern   Not on file  Social History Narrative   Not on file   Social Determinants of Health   Financial Resource Strain: Low Risk  (06/17/2017)   Overall Financial Resource Strain (CARDIA)    Difficulty of Paying Living Expenses: Not hard at all  Food Insecurity: No Food Insecurity (02/28/2022)   Hunger Vital Sign    Worried About Running Out of Food in the Last Year: Never true    Ran Out of Food in the Last Year: Never true  Transportation Needs: No Transportation Needs (02/28/2022)   PRAPARE - Administrator, Civil Service (Medical): No    Lack of Transportation (Non-Medical): No  Physical Activity: Unknown (06/17/2017)   Exercise Vital Sign    Days of Exercise per Week: Patient declined    Minutes of Exercise per Session: Patient declined  Stress: No Stress Concern Present (06/17/2017)   [employer] of Occupational Health - Occupational Stress Questionnaire    Feeling of Stress : Not at all  Social Connections: Unknown (06/17/2017)   Social Connection and Isolation Panel [NHANES]    Frequency of Communication with Friends and Family: Patient declined    Frequency of Social Gatherings with Friends and Family: Patient declined    Attends Religious Services: Patient declined    Database administrator or  Organizations: Patient declined    Attends Banker Meetings: Patient declined    Marital Status: Patient declined  Intimate Partner Violence: Unknown (06/17/2017)   Humiliation, Afraid, Rape, and Kick questionnaire    Fear of Current or Ex-Partner: Patient declined    Emotionally Abused: Patient declined    Physically Abused: Patient declined    Sexually Abused: Patient declined    Family History  Problem Relation Age of Onset   Diabetes Father    Hypertension Sister    Diabetes Mother    Stroke Mother    Colon cancer Mother    Colon cancer Maternal Grandmother    Ovarian cancer Neg Hx    Breast cancer Neg Hx     No Known Allergies  Review of Systems  Constitutional:  Positive for  chills, fever and malaise/fatigue. Negative for diaphoresis and weight loss.  HENT:  Negative for congestion, ear discharge and sore throat.   Eyes: Negative.   Respiratory:  Positive for stridor. Negative for cough, sputum production, shortness of breath and wheezing.   Cardiovascular: Negative.  Negative for chest pain, palpitations and leg swelling.  Gastrointestinal:  Positive for nausea. Negative for abdominal pain, blood in stool, constipation, diarrhea, heartburn, melena and vomiting.  Genitourinary: Negative.  Negative for dysuria and flank pain.  Musculoskeletal:  Positive for back pain. Negative for joint pain and myalgias.  Skin: Negative.   Neurological:  Positive for headaches. Negative for dizziness and tingling.  Endo/Heme/Allergies: Negative.   Psychiatric/Behavioral: Negative.  Negative for depression and suicidal ideas. The patient is not nervous/anxious.        Objective:   BP 128/60   Pulse 83   Temp (!) 96.6 F (35.9 C) (Tympanic)   Ht 5' (1.524 m)   Wt 137 lb 6.4 oz (62.3 kg)   SpO2 95%   BMI 26.83 kg/m   Vitals:   02/25/23 1108  BP: 128/60  Pulse: 83  Temp: (!) 96.6 F (35.9 C)  Height: 5' (1.524 m)  Weight: 137 lb 6.4 oz (62.3 kg)  SpO2: 95%   TempSrc: Tympanic  BMI (Calculated): 26.83    Physical Exam Vitals and nursing note reviewed.  Constitutional:      General: She is not in acute distress.    Appearance: Normal appearance. She is not ill-appearing.  HENT:     Head: Normocephalic and atraumatic.     Nose: Nose normal.     Mouth/Throat:     Mouth: Mucous membranes are moist.     Pharynx: Oropharynx is clear.  Eyes:     Conjunctiva/sclera: Conjunctivae normal.     Pupils: Pupils are equal, round, and reactive to light.  Cardiovascular:     Rate and Rhythm: Normal rate and regular rhythm.     Pulses: Normal pulses.     Heart sounds: Normal heart sounds. No murmur heard. Pulmonary:     Effort: Pulmonary effort is normal.     Breath sounds: Normal breath sounds. No wheezing.  Abdominal:     General: Bowel sounds are normal.     Palpations: Abdomen is soft.     Tenderness: There is no abdominal tenderness. There is no right CVA tenderness or left CVA tenderness.  Musculoskeletal:        General: Normal range of motion.     Cervical back: Normal range of motion.     Right lower leg: No edema.     Left lower leg: No edema.  Skin:    General: Skin is warm and dry.  Neurological:     General: No focal deficit present.     Mental Status: She is alert and oriented to person, place, and time.  Psychiatric:        Mood and Affect: Mood normal.        Behavior: Behavior normal.      Results for orders placed or performed in visit on 02/25/23  POCT Urinalysis Dipstick (81002)  Result Value Ref Range   Color, UA orange    Clarity, UA turbid    Glucose, UA Negative Negative   Bilirubin, UA neg    Ketones, UA neg    Spec Grav, UA 1.025 1.010 - 1.025   Blood, UA neg    pH, UA 5.5 5.0 - 8.0   Protein, UA Negative Negative  Urobilinogen, UA 0.2 0.2 or 1.0 E.U./dL   Nitrite, UA neg    Leukocytes, UA Trace (A) Negative   Appearance turbid    Odor yes   POCT XPERT XPRESS SARS COVID-2/FLU/RSV  Result Value Ref  Range   SARS Coronavirus 2 neg    FLU A neg    FLU B neg    RSV RNA, PCR neg         Assessment & Plan:  For viral syndrome patient advised to rest, drink plenty of fluids, and take Tylenol as needed.  She is advised to let us know if anything changes or gets worse. Problem List Items Addressed This Visit     Hyperlipidemia   Essential hypertension, benign   Other Visit Diagnoses     Viral syndrome    -  Primary   Dysuria       Relevant Orders   POCT Urinalysis Dipstick (16109) (Completed)   Flu-like symptoms       Relevant Orders   POCT XPERT XPRESS SARS COVID-2/FLU/RSV (Completed)   Fever, unspecified fever cause       Relevant Orders   POCT XPERT XPRESS SARS COVID-2/FLU/RSV (Completed)       Follow up as scheduled.  Total time spent: 25 minutes  Margaretann Loveless, MD  02/25/2023   This document may have been prepared by [facility] Voice Recognition software and as such may include unintentional dictation errors.

## 2023-03-12 ENCOUNTER — Other Ambulatory Visit: Payer: Self-pay | Admitting: Internal Medicine

## 2023-03-18 ENCOUNTER — Ambulatory Visit
Admission: RE | Admit: 2023-03-18 | Discharge: 2023-03-18 | Disposition: A | Discharge status: Home / Self Care | Payer: Medicare Other | Source: Ambulatory Visit | Attending: Surgery | Admitting: Surgery

## 2023-03-18 DIAGNOSIS — M5416 Radiculopathy, lumbar region: Secondary | ICD-10-CM

## 2023-04-04 ENCOUNTER — Other Ambulatory Visit: Payer: Self-pay | Admitting: Internal Medicine

## 2023-04-06 NOTE — Patient Instructions (Signed)
____________________________________________________________________________________________  Opioid Pain Medication Update  To: All patients taking opioid pain medications. (I.e.: hydrocodone, hydromorphone, oxycodone, oxymorphone, morphine, codeine, methadone, tapentadol, tramadol, buprenorphine, fentanyl, etc.)  Re: Updated review of side effects and adverse reactions of opioid analgesics, as well as new information about long term effects of this class of medications.  Direct risks of long-term opioid therapy are not limited to opioid addiction and overdose. Potential medical risks include serious fractures, breathing problems during sleep, hyperalgesia, immunosuppression, chronic constipation, bowel obstruction, myocardial infarction, and tooth decay secondary to xerostomia.  Unpredictable adverse effects that can occur even if you take your medication correctly: Cognitive impairment, respiratory depression, and death. Most people think that if they take their medication "correctly", and "as instructed", that they will be safe. Nothing could be farther from the truth. In reality, a significant amount of recorded deaths associated with the use of opioids has occurred in individuals that had taken the medication for a long time, and were taking their medication correctly. The following are examples of how this can happen: Patient taking his/her medication for a long time, as instructed, without any side effects, is given a certain antibiotic or another unrelated medication, which in turn triggers a "Drug-to-drug interaction" leading to disorientation, cognitive impairment, impaired reflexes, respiratory depression or an untoward event leading to serious bodily harm or injury, including death.  Patient taking his/her medication for a long time, as instructed, without any side effects, develops an acute impairment of liver and/or kidney function. This will lead to a rapid inability of the body to  breakdown and eliminate their pain medication, which will result in effects similar to an "overdose", but with the same medicine and dose that they had always taken. This again may lead to disorientation, cognitive impairment, impaired reflexes, respiratory depression or an untoward event leading to serious bodily harm or injury, including death.  A similar problem will occur with patients as they grow older and their liver and kidney function begins to decrease as part of the aging process.  Background information: Historically, the original case for using long-term opioid therapy to treat chronic noncancer pain was based on safety assumptions that subsequent experience has called into question. In 1996, the American Pain Society and the American Academy of Pain Medicine issued a consensus statement supporting long-term opioid therapy. This statement acknowledged the dangers of opioid prescribing but concluded that the risk for addiction was low; respiratory depression induced by opioids was short-lived, occurred mainly in opioid-naive patients, and was antagonized by pain; tolerance was not a common problem; and efforts to control diversion should not constrain opioid prescribing. This has now proven to be wrong. Experience regarding the risks for opioid addiction, misuse, and overdose in community practice has failed to support these assumptions.  According to the Centers for Disease Control and Prevention, fatal overdoses involving opioid analgesics have increased sharply over the past decade. Currently, more than 96,700 people die from drug overdoses every year. Opioids are a factor in 7 out of every 10 overdose deaths. Deaths from drug overdose have surpassed motor vehicle accidents as the leading cause of death for individuals between the ages of 80 and 61.  Clinical data suggest that neuroendocrine dysfunction may be very common in both men and women, potentially causing hypogonadism, erectile  dysfunction, infertility, decreased libido, osteoporosis, and depression. Recent studies linked higher opioid dose to increased opioid-related mortality. Controlled observational studies reported that long-term opioid therapy may be associated with increased risk for cardiovascular events. Subsequent meta-analysis concluded  that the safety of long-term opioid therapy in elderly patients has not been proven.   Side Effects and adverse reactions: Common side effects: Drowsiness (sedation). Dizziness. Nausea and vomiting. Constipation. Physical dependence -- Dependence often manifests with withdrawal symptoms when opioids are discontinued or decreased. Tolerance -- As you take repeated doses of opioids, you require increased medication to experience the same effect of pain relief. Respiratory depression -- This can occur in healthy people, especially with higher doses. However, people with COPD, asthma or other lung conditions may be even more susceptible to fatal respiratory impairment.  Uncommon side effects: An increased sensitivity to feeling pain and extreme response to pain (hyperalgesia). Chronic use of opioids can lead to this. Delayed gastric emptying (the process by which the contents of your stomach are moved into your small intestine). Muscle rigidity. Immune system and hormonal dysfunction. Quick, involuntary muscle jerks (myoclonus). Arrhythmia. Itchy skin (pruritus). Dry mouth (xerostomia).  Long-term side effects: Chronic constipation. Sleep-disordered breathing (SDB). Increased risk of bone fractures. Hypothalamic-pituitary-adrenal dysregulation. Increased risk of overdose.  RISKS: Respiratory depression and death: Opioids increase the risk of respiratory depression and death.  Drug-to-drug interactions: Opioids are relatively contraindicated in combination with benzodiazepines, sleep inducers, and other central nervous system depressants. Other classes of medications  (i.e.: certain antibiotics and even over-the-counter medications) may also trigger or induce respiratory depression in some patients.  Medical conditions: Patients with pre-existing respiratory problems are at higher risk of respiratory failure and/or depression when in combination with opioid analgesics. Opioids are relatively contraindicated in some medical conditions such as central sleep apnea.   Fractures and Falls:  Opioids increase the risk and incidence of falls. This is of particular importance in elderly patients.  Endocrine System:  Long-term administration is associated with endocrine abnormalities (endocrinopathies). (Also known as Opioid-induced Endocrinopathy) Influences on both the hypothalamic-pituitary-adrenal axis?and the hypothalamic-pituitary-gonadal axis have been demonstrated with consequent hypogonadism and adrenal insufficiency in both sexes. Hypogonadism and decreased levels of dehydroepiandrosterone sulfate have been reported in men and women. Endocrine effects include: Amenorrhoea in women (abnormal absence of menstruation) Reduced libido in both sexes Decreased sexual function Erectile dysfunction in men Hypogonadisms (decreased testicular function with shrinkage of testicles) Infertility Depression and fatigue Loss of muscle mass Anxiety Depression Immune suppression Hyperalgesia Weight gain Anemia Osteoporosis Patients (particularly women of childbearing age) should avoid opioids. There is insufficient evidence to recommend routine monitoring of asymptomatic patients taking opioids in the long-term for hormonal deficiencies.  Immune System: Human studies have demonstrated that opioids have an immunomodulating effect. These effects are mediated via opioid receptors both on immune effector cells and in the central nervous system. Opioids have been demonstrated to have adverse effects on antimicrobial response and anti-tumour surveillance. Buprenorphine has  been demonstrated to have no impact on immune function.  Opioid Induced Hyperalgesia: Human studies have demonstrated that prolonged use of opioids can lead to a state of abnormal pain sensitivity, sometimes called opioid induced hyperalgesia (OIH). Opioid induced hyperalgesia is not usually seen in the absence of tolerance to opioid analgesia. Clinically, hyperalgesia may be diagnosed if the patient on long-term opioid therapy presents with increased pain. This might be qualitatively and anatomically distinct from pain related to disease progression or to breakthrough pain resulting from development of opioid tolerance. Pain associated with hyperalgesia tends to be more diffuse than the pre-existing pain and less defined in quality. Management of opioid induced hyperalgesia requires opioid dose reduction.  Cancer: Chronic opioid therapy has been associated with an increased risk of cancer  among noncancer patients with chronic pain. This association was more evident in chronic strong opioid users. Chronic opioid consumption causes significant pathological changes in the small intestine and colon. Epidemiological studies have found that there is a link between opium dependence and initiation of gastrointestinal cancers. Cancer is the second leading cause of death after cardiovascular disease. Chronic use of opioids can cause multiple conditions such as GERD, immunosuppression and renal damage as well as carcinogenic effects, which are associated with the incidence of cancers.   Mortality: Long-term opioid use has been associated with increased mortality among patients with chronic non-cancer pain (CNCP).  Prescription of long-acting opioids for chronic noncancer pain was associated with a significantly increased risk of all-cause mortality, including deaths from causes other than overdose.  Reference: Von Korff M, Kolodny A, Deyo RA, Chou R. Long-term opioid therapy reconsidered. Ann Intern Med. 2011  Sep 6;155(5):325-8. doi: 10.7326/[id number]. PMID: 64403474; PMCID: [id number]. Randon Goldsmith, Hayward RA, Dunn KM, [place] KP. Risk of adverse events in patients prescribed long-term opioids: A cohort study in the [place] [facility] Research Datalink. Eur J Pain. 2019 May;23(5):908-922. doi: 10.1002/ejp.1357. Epub 2019 Jan 31. PMID: 43329518. Colameco S, Coren JS, Ciervo CA. Continuous opioid treatment for chronic noncancer pain: a time for moderation in prescribing. Postgrad Med. 2009 Jul;121(4):61-6. doi: 10.3810/pgm.2009.07.2032. PMID: 84166063. William Hamburger RN, [place] SD, Blazina I, Cristopher Peru, Bougatsos C, Deyo RA. The effectiveness and risks of long-term opioid therapy for chronic pain: a systematic review for a [employer] of [employer] Pathways to [employer]. Ann Intern Med. 2015 Feb 17;162(4):276-86. doi: 10.7326/M14-2559. PMID: 01601093. Caryl Bis [facility], Makuc DM. NCHS Data Brief No. 22. [place]: Centers for Disease Control and Prevention; 2009. Sep, Increase in Fatal Poisonings Involving Opioid Analgesics in the [place], 1999-2006. Song IA, Choi HR, Oh TK. Long-term opioid use and mortality in patients with chronic non-cancer pain: Ten-year follow-up study in [place] from 2010 through 2019. EClinicalMedicine. 2022 Jul 18;51:101558. doi: 10.1016/j.eclinm.2022.235573. PMID: 22025427; PMCID: [id number]. Huser, W., Schubert, T., Vogelmann, T. et al. All-cause mortality in patients with long-term opioid therapy compared with non-opioid analgesics for chronic non-cancer pain: a database study. BMC Med 18, 162 (2020). http://lester.info/ Rashidian H, Karie Kirks, Malekzadeh R, Haghdoost AA. An Ecological Study of the Association between Opiate Use and Incidence of Cancers. Addict Health. 2016 Fall;8(4):252-260. PMID: 51761607; PMCID: [id number].  Our Goal: Our goal is to control your  pain with means other than the use of opioid pain medications.  Our Recommendation: Talk to your physician about coming off of these medications. We can assist you with the tapering down and stopping these medicines. Based on the new information, even if you cannot completely stop the medication, a decrease in the dose may be associated with a lesser risk. Ask for other means of controlling the pain. Decrease or eliminate those factors that significantly contribute to your pain such as smoking, obesity, and a diet heavily tilted towards "inflammatory" nutrients.  Last Updated: 01/06/2023   ____________________________________________________________________________________________     ____________________________________________________________________________________________  National Pain Medication Shortage  The [place] is experiencing worsening drug shortages. These have had a negative widespread effect on patient care and treatment. Not expected to improve any time soon. Predicted to last past 2029.   Drug shortage list (generic names) Oxycodone IR Oxycodone/APAP Oxymorphone IR Hydromorphone Hydrocodone/APAP Morphine  Where is the problem?  Manufacturing and supply level.  Will this shortage affect you?  Only if you  take any of the above pain medications.  How? You may be unable to fill your prescription.  Your pharmacist may offer a "partial fill" of your prescription. (Warning: Do not accept partial fills.) Prescriptions partially filled cannot be transferred to another pharmacy. Read our Medication Rules and Regulation. Depending on how much medicine you are dependent on, you may experience withdrawals when unable to get the medication.  Recommendations: Consider ending your dependence on opioid pain medications. Ask your pain specialist to assist you with the process. Consider switching to a medication currently not in shortage, such as Buprenorphine. Talk to your pain  specialist about this option. Consider decreasing your pain medication requirements by managing tolerance thru "Drug Holidays". This may help minimize withdrawals, should you run out of medicine. Control your pain thru the use of non-pharmacological interventional therapies.   Your prescriber: Prescribers cannot be blamed for shortages. Medication manufacturing and supply issues cannot be fixed by the prescriber.   NOTE: The prescriber is not responsible for supplying the medication, or solving supply issues. Work with your pharmacist to solve it. The patient is responsible for the decision to take or continue taking the medication and for identifying and securing a legal supply source. By law, supplying the medication is the job and responsibility of the pharmacy. The prescriber is responsible for the evaluation, monitoring, and prescribing of these medications.   Prescribers will NOT: Re-issue prescriptions that have been partially filled. Re-issue prescriptions already sent to a pharmacy.  Re-send prescriptions to a different pharmacy because yours did not have your medication. Ask pharmacist to order more medicine or transfer the prescription to another pharmacy. (Read below.)  New 2023 regulation: "March 01, 2022 Revised Regulation Allows DEA-Registered Pharmacies to Transfer Electronic Prescriptions at a Patient's Request DEA Headquarters Division - Public Information Office Patients now have the ability to request their electronic prescription be transferred to another pharmacy without having to go back to their practitioner to initiate the request. This revised regulation went into effect on Monday, February 25, 2022.     At a patient's request, a DEA-registered retail pharmacy can now transfer an electronic prescription for a controlled substance (schedules II-V) to another DEA-registered retail pharmacy. Prior to this change, patients would have to go through their practitioner to  cancel their prescription and have it re-issued to a different pharmacy. The process was taxing and time consuming for both patients and practitioners.    The Drug Enforcement Administration [facility]) published its intent to revise the process for transferring electronic prescriptions on May 19, 2020.  The final rule was published in the federal register on January 24, 2022 and went into effect 30 days later.  Under the final rule, a prescription can only be transferred once between pharmacies, and only if allowed under existing state or other applicable law. The prescription must remain in its electronic form; may not be altered in any way; and the transfer must be communicated directly between two licensed pharmacists. It's important to note, any authorized refills transfer with the original prescription, which means the entire prescription will be filled at the same pharmacy".  Reference: [email or web address].is [facility] website announcement)  [email or web address] [employer] of Justice)   [employer] / Vol. 88, No. 143 / Thursday, January 24, 2022 / Rules and Regulations DEPARTMENT OF JUSTICE  Drug Enforcement Administration  21 CFR Part 1306  [Docket No. [id number]]  RIN [id number] Transfer of Electronic Prescriptions for Schedules II-V Controlled Substances Between Pharmacies for Initial Filling  ____________________________________________________________________________________________  ____________________________________________________________________________________________  Transfer of Pain Medication between Pharmacies  Re: 2023 DEA Clarification on existing regulation  Published on DEA Website: March 01, 2022  Title: Revised Regulation Allows DEA-Registered Pharmacies to Electrical engineer Prescriptions at a Patient's  Request DEA Headquarters Division - [employer]  "Patients now have the ability to request their electronic prescription be transferred to another pharmacy without having to go back to their practitioner to initiate the request. This revised regulation went into effect on Monday, February 25, 2022.     At a patient's request, a DEA-registered retail pharmacy can now transfer an electronic prescription for a controlled substance (schedules II-V) to another DEA-registered retail pharmacy. Prior to this change, patients would have to go through their practitioner to cancel their prescription and have it re-issued to a different pharmacy. The process was taxing and time consuming for both patients and practitioners.    The Drug Enforcement Administration [facility]) published its intent to revise the process for transferring electronic prescriptions on May 19, 2020.  The final rule was published in the federal register on January 24, 2022 and went into effect 30 days later.  Under the final rule, a prescription can only be transferred once between pharmacies, and only if allowed under existing state or other applicable law. The prescription must remain in its electronic form; may not be altered in any way; and the transfer must be communicated directly between two licensed pharmacists. It's important to note, any authorized refills transfer with the original prescription, which means the entire prescription will be filled at the same pharmacy."    REFERENCES: 1. DEA website announcement [email or web address]  2. Department of Justice website  [email or web address]  3. DEPARTMENT OF JUSTICE Drug Enforcement Administration 21 CFR Part 1306 [Docket No. [id number]] RIN [id number] "Transfer of Electronic Prescriptions for Schedules II-V Controlled Substances  Between Pharmacies for Initial Filling"  ____________________________________________________________________________________________     _______________________________________________________________________  Medication Rules  Purpose: To inform patients, and their family members, of our medication rules and regulations.  Applies to: All patients receiving prescriptions from our practice (written or electronic).  Pharmacy of record: This is the pharmacy where your electronic prescriptions will be sent. Make sure we have the correct one.  Electronic prescriptions: In compliance with the [facility] Strengthen Opioid Misuse Prevention (STOP) Act of 2017 (Session Conni Elliot [phone]), effective July 01, 2018, all controlled substances must be electronically prescribed. Written prescriptions, faxing, or calling prescriptions to a pharmacy will no longer be done.  Prescription refills: These will be provided only during in-person appointments. No medications will be renewed without a "face-to-face" evaluation with your provider. Applies to all prescriptions.  NOTE: The following applies primarily to controlled substances (Opioid* Pain Medications).   Type of encounter (visit): For patients receiving controlled substances, face-to-face visits are required. (Not an option and not up to the patient.)  Patient's responsibilities: Pain Pills: Bring all pain pills to every appointment (except for procedure appointments). Pill Bottles: Bring pills in original pharmacy bottle. Bring bottle, even if empty. Always bring the bottle of the most recent fill.  Medication refills: You are responsible for knowing and keeping track of what medications you are taking and when is it that you will need a refill. The day before your appointment: write a list of all prescriptions that need to be refilled. The day of the appointment: give the list to the admitting nurse. Prescriptions will be written only  during appointments. No prescriptions will be written on procedure days. If you forget a  medication: it will not be "Called in", "Faxed", or "electronically sent". You will need to get another appointment to get these prescribed. No early refills. Do not call asking to have your prescription filled early. Partial  or short prescriptions: Occasionally your pharmacy may not have enough pills to fill your prescription.  NEVER ACCEPT a partial fill or a prescription that is short of the total amount of pills that you were prescribed.  With controlled substances the law allows 72 hours for the pharmacy to complete the prescription.  If the prescription is not completed within 72 hours, the pharmacist will require a new prescription to be written. This means that you will be short on your medicine and we WILL NOT send another prescription to complete your original prescription.  Instead, request the pharmacy to send a carrier to a nearby branch to get enough medication to provide you with your full prescription. Prescription Accuracy: You are responsible for carefully inspecting your prescriptions before leaving our office. Have the discharge nurse carefully go over each prescription with you, before taking them home. Make sure that your name is accurately spelled, that your address is correct. Check the name and dose of your medication to make sure it is accurate. Check the number of pills, and the written instructions to make sure they are clear and accurate. Make sure that you are given enough medication to last until your next medication refill appointment. Taking Medication: Take medication as prescribed. When it comes to controlled substances, taking less pills or less frequently than prescribed is permitted and encouraged. Never take more pills than instructed. Never take the medication more frequently than prescribed.  Inform other Doctors: Always inform, all of your healthcare providers, of all the  medications you take. Pain Medication from other Providers: You are not allowed to accept any additional pain medication from any other Doctor or Healthcare provider. There are two exceptions to this rule. (see below) In the event that you require additional pain medication, you are responsible for notifying us, as stated below. Cough Medicine: Often these contain an opioid, such as codeine or hydrocodone. Never accept or take cough medicine containing these opioids if you are already taking an opioid* medication. The combination may cause respiratory failure and death. Medication Agreement: You are responsible for carefully reading and following our Medication Agreement. This must be signed before receiving any prescriptions from our practice. Safely store a copy of your signed Agreement. Violations to the Agreement will result in no further prescriptions. (Additional copies of our Medication Agreement are available upon request.) Laws, Rules, & Regulations: All patients are expected to follow all [address] and [employer], [employer], Rules, [place] [employer]. Ignorance of the Laws does not constitute a valid excuse.  Illegal drugs and Controlled Substances: The use of illegal substances (including, but not limited to marijuana and its derivatives) and/or the illegal use of any controlled substances is strictly prohibited. Violation of this rule may result in the immediate and permanent discontinuation of any and all prescriptions being written by our practice. The use of any illegal substances is prohibited. Adopted CDC guidelines & recommendations: Target dosing levels will be at or below 60 MME/day. Use of benzodiazepines** is not recommended.  Exceptions: There are only two exceptions to the rule of not receiving pain medications from other Healthcare Providers. Exception #1 (Emergencies): In the event of an emergency (i.e.: accident requiring emergency care), you are allowed to receive additional pain  medication. However, you are responsible for: As soon as  you are able, call our office [phone], at any time of the day or night, and leave a message stating your name, the date and nature of the emergency, and the name and dose of the medication prescribed. In the event that your call is answered by a member of our staff, make sure to document and save the date, time, and the name of the person that took your information.  Exception #2 (Planned Surgery): In the event that you are scheduled by another doctor or dentist to have any type of surgery or procedure, you are allowed (for a period no longer than 30 days), to receive additional pain medication, for the acute post-op pain. However, in this case, you are responsible for picking up a copy of our "Post-op Pain Management for Surgeons" handout, and giving it to your surgeon or dentist. This document is available at our office, and does not require an appointment to obtain it. Simply go to our office during business hours (Monday-Thursday from 8:00 AM to 4:00 PM) (Friday 8:00 AM to 12:00 Noon) or if you have a scheduled appointment with [place], prior to your surgery, and ask for it by name. In addition, you are responsible for: calling our office [phone], at any time of the day or night, and leaving a message stating your name, name of your surgeon, type of surgery, and date of procedure or surgery. Failure to comply with your responsibilities may result in termination of therapy involving the controlled substances. Medication Agreement Violation. Following the above rules, including your responsibilities will help you in avoiding a Medication Agreement Violation ("Breaking your Pain Medication Contract").  Consequences:  Not following the above rules may result in permanent discontinuation of medication prescription therapy.  *Opioid medications include: morphine, codeine, oxycodone, oxymorphone, hydrocodone, hydromorphone, meperidine, tramadol,  tapentadol, buprenorphine, fentanyl, methadone. **Benzodiazepine medications include: diazepam (Valium), alprazolam (Xanax), clonazepam (Klonopine), lorazepam (Ativan), clorazepate (Tranxene), chlordiazepoxide (Librium), estazolam (Prosom), oxazepam (Serax), temazepam (Restoril), triazolam (Halcion) (Last updated: 04/23/2022) ______________________________________________________________________    ______________________________________________________________________  Medication Recommendations and Reminders  Applies to: All patients receiving prescriptions (written and/or electronic).  Medication Rules & Regulations: You are responsible for reading, knowing, and following our "Medication Rules" document. These exist for your safety and that of others. They are not flexible and neither are we. Dismissing or ignoring them is an act of "non-compliance" that may result in complete and irreversible termination of such medication therapy. For safety reasons, "non-compliance" will not be tolerated. As with the [place] fundamental legal principle of "ignorance of the law is no defense", we will accept no excuses for not having read and knowing the content of documents provided to you by our practice.  Pharmacy of record:  Definition: This is the pharmacy where your electronic prescriptions will be sent.  We do not endorse any particular pharmacy. It is up to you and your insurance to decide what pharmacy to use.  We do not restrict you in your choice of pharmacy. However, once we write for your prescriptions, we will NOT be re-sending more prescriptions to fix restricted supply problems created by your pharmacy, or your insurance.  The pharmacy listed in the electronic medical record should be the one where you want electronic prescriptions to be sent. If you choose to change pharmacy, simply notify our nursing staff. Changes will be made only during your regular appointments and not over the  phone.  Recommendations: Keep all of your pain medications in a safe place, under lock and key, even  if you live alone. We will NOT replace lost, stolen, or damaged medication. We do not accept "Police Reports" as proof of medications having been stolen. After you fill your prescription, take 1 week's worth of pills and put them away in a safe place. You should keep a separate, properly labeled bottle for this purpose. The remainder should be kept in the original bottle. Use this as your primary supply, until it runs out. Once it's gone, then you know that you have 1 week's worth of medicine, and it is time to come in for a prescription refill. If you do this correctly, it is unlikely that you will ever run out of medicine. To make sure that the above recommendation works, it is very important that you make sure your medication refill appointments are scheduled at least 1 week before you run out of medicine. To do this in an effective manner, make sure that you do not leave the office without scheduling your next medication management appointment. Always ask the nursing staff to show you in your prescription , when your medication will be running out. Then arrange for the receptionist to get you a return appointment, at least 7 days before you run out of medicine. Do not wait until you have 1 or 2 pills left, to come in. This is very poor planning and does not take into consideration that we may need to cancel appointments due to bad weather, sickness, or emergencies affecting our staff. DO NOT ACCEPT A "Partial Fill": If for any reason your pharmacy does not have enough pills/tablets to completely fill or refill your prescription, do not allow for a "partial fill". The law allows the pharmacy to complete that prescription within 72 hours, without requiring a new prescription. If they do not fill the rest of your prescription within those 72 hours, you will need a separate prescription to fill the remaining  amount, which we will NOT provide. If the reason for the partial fill is your insurance, you will need to talk to the pharmacist about payment alternatives for the remaining tablets, but again, DO NOT ACCEPT A PARTIAL FILL, unless you can trust your pharmacist to obtain the remainder of the pills within 72 hours.  Prescription refills and/or changes in medication(s):  Prescription refills, and/or changes in dose or medication, will be conducted only during scheduled medication management appointments. (Applies to both, written and electronic prescriptions.) No refills on procedure days. No medication will be changed or started on procedure days. No changes, adjustments, and/or refills will be conducted on a procedure day. Doing so will interfere with the diagnostic portion of the procedure. No phone refills. No medications will be "called into the pharmacy". No Fax refills. No weekend refills. No Holliday refills. No after hours refills.  Remember:  Business hours are:  Monday to Thursday 8:00 AM to 4:00 PM Provider's Schedule: Delano Metz, MD - Appointments are:  Medication management: Monday and Wednesday 8:00 AM to 4:00 PM Procedure day: Tuesday and Thursday 7:30 AM to 4:00 PM Edward Jolly, MD - Appointments are:  Medication management: Tuesday and Thursday 8:00 AM to 4:00 PM Procedure day: Monday and Wednesday 7:30 AM to 4:00 PM (Last update: 04/23/2022) ______________________________________________________________________   ____________________________________________________________________________________________  Naloxone Nasal Spray  Why am I receiving this medication? [place] STOP ACT requires that all patients taking high dose opioids or at risk of opioids respiratory depression, be prescribed an opioid reversal agent, such as Naloxone (AKA: Narcan).  What is this medication? NALOXONE (  nal OX one) treats opioid overdose, which causes slow or shallow breathing,  severe drowsiness, or trouble staying awake. Call emergency services after using this medication. You may need additional treatment. Naloxone works by reversing the effects of opioids. It belongs to a group of medications called opioid blockers.  COMMON BRAND NAME(S): Kloxxado, Narcan  What should I tell my care team before I take this medication? They need to know if you have any of these conditions: Heart disease Substance use disorder An unusual or allergic reaction to naloxone, other medications, foods, dyes, or preservatives Pregnant or trying to get pregnant Breast-feeding  When to use this medication? This medication is to be used for the treatment of respiratory depression (less than 8 breaths per minute) secondary to opioid overdose.   How to use this medication? This medication is for use in the nose. Lay the person on their back. Support their neck with your hand and allow the head to tilt back before giving the medication. The nasal spray should be given into 1 nostril. After giving the medication, move the person onto their side. Do not remove or test the nasal spray until ready to use. Get emergency medical help right away after giving the first dose of this medication, even if the person wakes up. You should be familiar with how to recognize the signs and symptoms of a narcotic overdose. If more doses are needed, give the additional dose in the other nostril. Talk to your care team about the use of this medication in children. While this medication may be prescribed for children as young as newborns for selected conditions, precautions do apply.  Naloxone Overdosage: If you think you have taken too much of this medicine contact a poison control center or emergency room at once.  NOTE: This medicine is only for you. Do not share this medicine with others.  What if I miss a dose? This does not apply.  What may interact with this medication? This is only used during an  emergency. No interactions are expected during emergency use. This list may not describe all possible interactions. Give your health care provider a list of all the medicines, herbs, non-prescription drugs, or dietary supplements you use. Also tell them if you smoke, drink alcohol, or use illegal drugs. Some items may interact with your medicine.  What should I watch for while using this medication? Keep this medication ready for use in the case of an opioid overdose. Make sure that you have the phone number of your care team and local hospital ready. You may need to have additional doses of this medication. Each nasal spray contains a single dose. Some emergencies may require additional doses. After use, bring the treated person to the nearest hospital or call 911. Make sure the treating care team knows that the person has received a dose of this medication. You will receive additional instructions on what to do during and after use of this medication before an emergency occurs.  What side effects may I notice from receiving this medication? Side effects that you should report to your care team as soon as possible: Allergic reactions--skin rash, itching, hives, swelling of the face, lips, tongue, or throat Side effects that usually do not require medical attention (report these to your care team if they continue or are bothersome): Constipation Dryness or irritation inside the nose Headache Increase in blood pressure Muscle spasms Stuffy nose Toothache This list may not describe all possible side effects. Call your doctor for  medical advice about side effects. You may report side effects to FDA at 1-800-FDA-1088.  Where should I keep my medication? Because this is an emergency medication, you should keep it with you at all times.  Keep out of the reach of children and pets. Store between 20 and 25 degrees C (68 and 77 degrees F). Do not freeze. Throw away any unused medication after the  expiration date. Keep in original box until ready to use.  NOTE: This sheet is a summary. It may not cover all possible information. If you have questions about this medicine, talk to your doctor, pharmacist, or health care provider.   2023 [employer]/Gold Standard (2021-02-23 00:00:00)  ____________________________________________________________________________________________

## 2023-04-06 NOTE — Progress Notes (Unsigned)
PROVIDER NOTE: Information contained herein reflects review and annotations entered in association with encounter. Interpretation of such information and data should be left to medically-trained personnel. Information provided to patient can be located elsewhere in the medical record under "Patient Instructions". Document created using STT-dictation technology, any transcriptional errors that may result from process are unintentional.    Patient: Jacqueline Thornton  Service Category: E/M  Provider: Oswaldo Done, MD  DOB: 02-17-1951  DOS: 04/07/2023  Referring Provider: Margaretann Loveless, MD  MRN: [id number]  Specialty: Interventional Pain Management  PCP: Margaretann Loveless, MD  Type: Established Patient  Setting: Ambulatory outpatient    Location: Office  Delivery: Face-to-face     HPI  [name], a 72 y.o. year old female, is here today because of her No primary diagnosis found.. Ms. Rojero's primary complain today is No chief complaint on file.  Pertinent problems: Ms. Hugh has Chronic pain syndrome; Headache, migraine; Chronic low back pain (Bilateral) (R>L) w/o sciatica; Spondylosis of lumbar spine; Lumbar annular disc tear (L4-5); Discogenic low back pain (L3-4 and L4-5); Lumbar facet hypertrophy; Lumbar facet syndrome (Bilateral) (R>L); Chronic neck pain (Left); Cervical spondylosis; Hx of cervical spine surgery; Cervical spinal fusion (C6-7 interbody fusion); Chronic sacroiliac joint pain (Bilateral) (L>R); Chronic lumbar radicular pain (Left); Trochanteric bursitis of hip (Right); Chronic hip pain (Bilateral) (L>R); Neurogenic pain; Myofascial pain; Osteoarthritis of hip (Left); Arthrodesis status; DDD (degenerative disc disease), lumbar; Myalgia; Trochanteric bursitis of hip (Bilateral) (L>R); Spondylosis without myelopathy or radiculopathy, lumbosacral region; Spondylosis without myelopathy or radiculopathy, sacral and sacrococcygeal region; Other specified dorsopathies, sacral and  sacrococcygeal region; Age-related osteoporosis without current pathological fracture; Bone island of right femur; Inflammatory spondylopathy of lumbar region [facility]); Trigger point with back pain (Right); Fibromyalgia; DDD (degenerative disc disease), cervical; Spondylolisthesis of lumbar region; History of lumbar spinal fusion (Dr. Maeola Harman) (12/11/2018); Failed back surgical syndrome; Unilateral occipital headache (Left); Cervicogenic headache (Left); Chronic hip pain (Left); Spondylolisthesis at L5-S1 level; Spondylosis of lumbosacral region without myelopathy or radiculopathy; Epidural fibrosis; Enthesopathy of hip region (Left); Chronic sacroiliac joint pain (Left); Enthesopathy of sacroiliac joint (Left); Somatic dysfunction of sacroiliac joint (Left); Herniated cervical disc without myelopathy; Chronic sacroiliac joint pain (Right); Osteoarthritis of sacroiliac joint (Right) (HCC); Somatic dysfunction of sacroiliac joint (Right); Chronic hip pain (Right); Neck pain; Herniated nucleus pulposus, C3-4; Prolapsed cervical intervertebral disc; Cervical spinal stenosis; Radiculopathy, cervical region; Lumbar central spinal stenosis, 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 Pain Assessment: Severity of   is reported as a  /10. Location:    / . Onset:  . Quality:  . Timing:  . Modifying factor(s):  Marland Kitchen Vitals:  vitals were not taken for this visit.  BMI: Estimated body mass index is 26.83 kg/m as calculated from the following:   Height as of 02/25/23: 5'  (1.524 m).   Weight as of 02/25/23: 137 lb 6.4 oz (62.3 kg). Last encounter: 01/01/2023. Last procedure: 12/17/2022.  Reason for encounter: medication management. ***  Pharmacotherapy Assessment  Analgesic: Oxycodone/APAP 7.5/325, 1 tab PO q 5 times a day. (56.25 mg/day of oxycodone) MME/day: 56.25 mg/day.   Monitoring: [place]  PMP: PDMP reviewed during this encounter.       Pharmacotherapy: No side-effects or adverse reactions reported. Compliance: No problems identified. Effectiveness: Clinically acceptable.  No notes on file  No results found for: "CBDTHCR" No results found for: "D8THCCBX" No results found for: "D9THCCBX"  UDS:  Summary  Date Value Ref Range Status  01/01/2023 Note  Final    Comment:    ==================================================================== [employer] (MW) ==================================================================== Test                             Result       Flag       Units  Drug Present and Declared for Prescription Verification   Oxycodone                      7012         EXPECTED   ng/mg creat   Oxymorphone                    2293         EXPECTED   ng/mg creat   Noroxycodone                   9132         EXPECTED   ng/mg creat   Noroxymorphone                 590          EXPECTED   ng/mg creat    Sources of oxycodone are scheduled prescription medications.    Oxymorphone, noroxycodone, and noroxymorphone are expected    metabolites of oxycodone. Oxymorphone is also available as a    scheduled prescription medication.  ==================================================================== Test                      Result    Flag   Units      Ref Range   Creatinine              41               mg/dL      >=16 ==================================================================== Declared Medications:  The flagging and interpretation on this report are based on the  following declared medications.  Unexpected results may  arise from  inaccuracies in the declared medications.   **Note: The testing scope of this panel includes these medications:   Oxycodone (Percocet)   **Note: The testing scope of this panel does not include the  following reported medications:   Acetaminophen (Percocet)  Atorvastatin (Lipitor)  Calcium  Cyanocobalamin  Docusate (Colace)  Duloxetine (Cymbalta)  Enalapril (Vasotec)  Estradiol (Estrace)  Furosemide (Lasix)  Gabapentin (Neurontin)  Magnesium  Melatonin  Methocarbamol (Robaxin)  Multivitamin  Naloxone (Narcan)  Omeprazole (Prilosec)  Spironolactone (Aldactone)  Supplement  Vitamin D ==================================================================== For clinical consultation, please call [phone]. ====================================================================       ROS  Constitutional: Denies any fever or chills Gastrointestinal: No reported hemesis, hematochezia, vomiting, or acute GI distress Musculoskeletal: Denies any acute onset joint swelling, redness, loss of ROM, or weakness Neurological: No reported episodes of acute onset apraxia, aphasia, dysarthria, agnosia, amnesia, paralysis, loss of coordination, or loss of consciousness  Medication Review  Calcium-Vitamin D-Vitamin K, Cyanocobalamin, DULoxetine, Magnesium, Melatonin, atorvastatin, docusate sodium, enalapril, estradiol, furosemide, gabapentin, methocarbamol, multivitamin with minerals, naloxone, omeprazole, oxyCODONE-acetaminophen, and spironolactone  History Review  Allergy: Ms.  Pryer has No Known Allergies. Drug: Ms. Fatemi  reports no history of drug use. Alcohol:  reports no history of alcohol use. Tobacco:  reports that she quit smoking about 19 years ago. Her smoking use included cigarettes. She has never used smokeless tobacco. Social: Ms. Vanhuss  reports that she quit smoking about 19 years ago. Her smoking use included cigarettes. She has never used smokeless  tobacco. She reports that she does not drink alcohol and does not use drugs. Medical:  has a past medical history of Absolute anemia (04/11/2015), Acute postoperative pain (08/07/2017), Angina pectoris (HCC), Anxiety, Arthritis, Arthropathy of sacroiliac joint (04/11/2015), Atypical face pain (04/24/2015), Back pain, CAD (coronary artery disease), Cancer (HCC), Chronic back pain, Constipation, Depression, Fibromyalgia, GERD (gastroesophageal reflux disease), 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) (04/11/2015), H/O: hysterectomy (1979), Heart murmur, Hyperlipidemia, Hypertension, Low back pain (04/06/2015), Lumbar radicular pain (04/18/2015), Migraine, Narrowing of intervertebral disc space (04/11/2015), Sacroiliac joint pain (04/11/2015), and Spine disorder. Surgical: Ms. Warsaw  has a past surgical history that includes Cholecystectomy; Abdominal hysterectomy; Neck surgery; Colonoscopy with propofol (N/A, 07/19/2015); caract surger; Laparoscopic salpingo oophorectomy (Bilateral, 03/03/2018); Cystoscopy (03/03/2018); Tonsillectomy; Appendectomy; Tumor removal; Anterior lumbar fusion (N/A, 12/11/2018); Abdominal exposure (N/A, 12/11/2018); Anterior cervical decomp/discectomy fusion (N/A, 10/06/2020); Toe Fusion (Right); Esophagogastroduodenoscopy (egd) with propofol (N/A, 09/11/2021); Colonoscopy with propofol (N/A, 09/11/2021); Colonoscopy with propofol (N/A, 03/05/2022); and Transforaminal lumbar interbody fusion w/ mis 1 level (Right, 04/30/2022). Family: family history includes Colon cancer in her maternal grandmother and mother; Diabetes in her father and mother; Hypertension in her sister; Stroke in her mother.  Laboratory Chemistry Profile   Renal Lab Results  Component Value Date   BUN 12 01/13/2023   CREATININE 1.00 01/13/2023   BCR 12 01/13/2023   GFRAA 47 (L) 12/16/2019   GFRNONAA >60 04/24/2022    Hepatic Lab Results  Component Value Date   AST 15 01/13/2023   ALT 11 01/13/2023   ALBUMIN  3.7 (L) 01/13/2023   ALKPHOS 52 01/13/2023    Electrolytes Lab Results  Component Value Date   NA 141 01/13/2023   K 4.4 01/13/2023   CL 103 01/13/2023   CALCIUM 9.1 01/13/2023   MG 2.4 (H) 12/16/2019    Bone Lab Results  Component Value Date   VD25OH 33.0 01/13/2023   25OHVITD1 53 12/16/2019   25OHVITD2 <1.0 12/16/2019   25OHVITD3 53 12/16/2019    Inflammation (CRP: Acute Phase) (ESR: Chronic Phase) Lab Results  Component Value Date   CRP 4 12/16/2019   ESRSEDRATE 6 12/16/2019         Note: Above Lab results reviewed.  Recent Imaging Review  DG PAIN CLINIC C-ARM 1-60 MIN NO REPORT Fluoro was used, but no Radiologist interpretation will be provided.  Please refer to "NOTES" tab for provider progress note. Note: Reviewed        Physical Exam  General appearance: Well nourished, well developed, and well hydrated. In no apparent acute distress Mental status: Alert, oriented x 3 (person, place, & time)       Respiratory: No evidence of acute respiratory distress Eyes: PERLA Vitals: There were no vitals taken for this visit. BMI: Estimated body mass index is 26.83 kg/m as calculated from the following:   Height as of 02/25/23: 5' (1.524 m).   Weight as of 02/25/23: 137 lb 6.4 oz (62.3 kg). Ideal: Patient weight not recorded  Assessment   Diagnosis Status  1. Chronic pain syndrome   2. Pharmacologic therapy   3. DDD (degenerative disc  disease), lumbar   4. Chronic hip pain (Bilateral) (L>R)   5. Chronic neck pain (Left)   6. Chronic sacroiliac joint pain (Bilateral) (L>R)   7. Lumbar facet syndrome (Bilateral) (R>L)   8. Trochanteric bursitis of hip (Bilateral) (L>R)   9. Encounter for medication management   10. Failed back surgical syndrome   11. Encounter for chronic pain management   12. Chronic use of opiate for therapeutic purpose   13. Chronic low back pain (Bilateral) (R>L) w/o sciatica   14. Chronic lower extremity pain (Bilateral) (R>L)     Controlled Controlled Controlled   Updated Problems: No problems updated.  Plan of Care  Problem-specific:  No problem-specific Assessment & Plan notes found for this encounter.  Jacqueline Thornton has a current medication list which includes the following long-term medication(s): atorvastatin, viactiv calcium plus d, cyanocobalamin, duloxetine, enalapril, estradiol, furosemide, gabapentin, omeprazole, oxycodone-acetaminophen, and spironolactone.  Pharmacotherapy (Medications Ordered): No orders of the defined types were placed in this encounter.  Orders:  No orders of the defined types were placed in this encounter.  Follow-up plan:   No follow-ups on file.      Interventional Therapies  Risk Factors  Considerations:     Lumbar HARDWARE (NO RFA)     Planned  Pending:   Diagnostic bilateral lumbar MBB (pedicle screw hardware inj.)  #1 (12/17/2022)  Diagnostic/therapeutic midline caudal ESI #2 + sacral interspinous ligament MNB/TPI #1    Under consideration:   Diagnostic lumbar SCS trial    Completed:   Diagnostic bilateral lumbar MBB (pedicle screw hardware inj.) x1 (12/17/2022)  Diagnostic bilateral L4 TFESI x1 (01/08/2022) (100/25/0)  Therapeutic right Racz procedure x1 (08/17/2020) (04/09/84/85) (100% relief of the right LEP) Diagnostic caudal ESI x1 (07/11/2020) (100/100/0/0)  Palliative left L5-S1 LESI x2 (07/27/2015) (70/70/0/0)  Diagnostic left lumbar facet MBB x7 (02/07/2022) (20/20/0/0)  Palliative right lumbar facet MBB x10 (02/07/2022) (20/20/0/0)  Therapeutic right shoulder joint inj. x1 (02/29/2016) (90/80/30)  Therapeutic left IA hip injection x3 (06/13/2020) (100/100/30/>50)  Diagnostic right SI joint Blk x3 (06/17/2017) (100/90/80/80)  Diagnostic/therapeutic left SI joint Blk x4 (06/17/2017) (100/90/80/80)  Palliative bilateral trochanteric bursa inj. x1 (12/11/2016) (100/100/90/90)  Therapeutic right lumbar facet RFA x2 (01/11/2021) (100/100/70)   Therapeutic left lumbar facet RFA x2 (12/19/2020) (100/100/75/>75) Therapeutic right SI joint RFA x1 (08/07/2017) (100/100/80)  Therapeutic left SI joint RFA x1 (09/16/2017) (100/100/100)    Therapeutic  Palliative (PRN) options:   Palliative Racz procedure  Palliative bilateral SI joint block  Palliative right trochanteric bursa injection    Pharmacotherapy  Nonopioids transferred 05/24/2020: Vitamin B12, Flexeril, and Neurontin.          Recent Visits No visits were found meeting these conditions. Showing recent visits within past 90 days and meeting all other requirements Future Appointments Date Type Provider Dept  04/07/23 Appointment Delano Metz, MD [facility]  Showing future appointments within next 90 days and meeting all other requirements  I discussed the assessment and treatment plan with the patient. The patient was provided an opportunity to ask questions and all were answered. The patient agreed with the plan and demonstrated an understanding of the instructions.  Patient advised to call back or seek an in-person evaluation if the symptoms or condition worsens.  Duration of encounter: *** minutes.  Total time on encounter, as per AMA guidelines included both the face-to-face and non-face-to-face time personally spent by the physician and/or other qualified health care professional(s) on the day of the encounter (includes time  in activities that require the physician or other qualified health care professional and does not include time in activities normally performed by clinical staff). Physician's time may include the following activities when performed: Preparing to see the patient (e.g., pre-charting review of records, searching for previously ordered imaging, lab work, and nerve conduction tests) Review of prior analgesic pharmacotherapies. Reviewing PMP Interpreting ordered tests (e.g., lab work, imaging, nerve conduction tests) Performing  post-procedure evaluations, including interpretation of diagnostic procedures Obtaining and/or reviewing separately obtained history Performing a medically appropriate examination and/or evaluation Counseling and educating the patient/family/caregiver Ordering medications, tests, or procedures Referring and communicating with other health care professionals (when not separately reported) Documenting clinical information in the electronic or other health record Independently interpreting results (not separately reported) and communicating results to the patient/ family/caregiver Care coordination (not separately reported)  Note by: Oswaldo Done, MD Date: 04/07/2023; Time: 4:41 PM

## 2023-04-07 ENCOUNTER — Ambulatory Visit (HOSPITAL_BASED_OUTPATIENT_CLINIC_OR_DEPARTMENT_OTHER): Payer: Medicare Other | Admitting: Pain Medicine

## 2023-04-07 DIAGNOSIS — M961 Postlaminectomy syndrome, not elsewhere classified: Secondary | ICD-10-CM

## 2023-04-07 DIAGNOSIS — Z91199 Patient's noncompliance with other medical treatment and regimen due to unspecified reason: Secondary | ICD-10-CM

## 2023-04-07 DIAGNOSIS — M51369 Other intervertebral disc degeneration, lumbar region without mention of lumbar back pain or lower extremity pain: Secondary | ICD-10-CM

## 2023-04-07 DIAGNOSIS — M7061 Trochanteric bursitis, right hip: Secondary | ICD-10-CM

## 2023-04-07 DIAGNOSIS — G8929 Other chronic pain: Secondary | ICD-10-CM

## 2023-04-07 DIAGNOSIS — G894 Chronic pain syndrome: Secondary | ICD-10-CM

## 2023-04-07 DIAGNOSIS — M25551 Pain in right hip: Secondary | ICD-10-CM

## 2023-04-07 DIAGNOSIS — M47816 Spondylosis without myelopathy or radiculopathy, lumbar region: Secondary | ICD-10-CM

## 2023-04-07 DIAGNOSIS — Z79899 Other long term (current) drug therapy: Secondary | ICD-10-CM

## 2023-04-07 DIAGNOSIS — Z79891 Long term (current) use of opiate analgesic: Secondary | ICD-10-CM

## 2023-04-08 NOTE — Progress Notes (Unsigned)
PROVIDER NOTE: Information contained herein reflects review and annotations entered in association with encounter. Interpretation of such information and data should be left to medically-trained personnel. Information provided to patient can be located elsewhere in the medical record under "Patient Instructions". Document created using STT-dictation technology, any transcriptional errors that may result from process are unintentional.    Patient: Jacqueline Thornton  Service Category: E/M  Provider: Oswaldo Done, MD  DOB: 14-Dec-1950  DOS: 04/09/2023  Referring Provider: Margaretann Loveless, MD  MRN: [id number]  Specialty: Interventional Pain Management  PCP: Margaretann Loveless, MD  Type: Established Patient  Setting: Ambulatory outpatient    Location: Office  Delivery: Face-to-face     HPI  [name], a 72 y.o. year old female, is here today because of her Chronic pain syndrome [G89.4]. [name]'s primary complain today is No chief complaint on file.  Pertinent problems: [name] has Chronic pain syndrome; Headache, migraine; Chronic low back pain (Bilateral) (R>L) w/o sciatica; Spondylosis of lumbar spine; Lumbar annular disc tear (L4-5); Discogenic low back pain (L3-4 and L4-5); Lumbar facet hypertrophy; Lumbar facet syndrome (Bilateral) (R>L); Chronic neck pain (Left); Cervical spondylosis; Hx of cervical spine surgery; Cervical spinal fusion (C6-7 interbody fusion); Chronic sacroiliac joint pain (Bilateral) (L>R); Chronic lumbar radicular pain (Left); Trochanteric bursitis of hip (Right); Chronic hip pain (Bilateral) (L>R); Neurogenic pain; Myofascial pain; Osteoarthritis of hip (Left); Arthrodesis status; DDD (degenerative disc disease), lumbar; Myalgia; Trochanteric bursitis of hip (Bilateral) (L>R); Spondylosis without myelopathy or radiculopathy, lumbosacral region; Spondylosis without myelopathy or radiculopathy, sacral and sacrococcygeal region; Other specified dorsopathies, sacral and  sacrococcygeal region; Age-related osteoporosis without current pathological fracture; Bone island of right femur; Inflammatory spondylopathy of lumbar region [facility]); Trigger point with back pain (Right); Fibromyalgia; DDD (degenerative disc disease), cervical; Spondylolisthesis of lumbar region; History of lumbar spinal fusion (Dr. Maeola Harman) (12/11/2018); Failed back surgical syndrome; Unilateral occipital headache (Left); Cervicogenic headache (Left); Chronic hip pain (Left); Spondylolisthesis at L5-S1 level; Spondylosis of lumbosacral region without myelopathy or radiculopathy; Epidural fibrosis; Enthesopathy of hip region (Left); Chronic sacroiliac joint pain (Left); Enthesopathy of sacroiliac joint (Left); Somatic dysfunction of sacroiliac joint (Left); Herniated cervical disc without myelopathy; Chronic sacroiliac joint pain (Right); Osteoarthritis of sacroiliac joint (Right) (HCC); Somatic dysfunction of sacroiliac joint (Right); Chronic hip pain (Right); Neck pain; Herniated nucleus pulposus, C3-4; Prolapsed cervical intervertebral disc; Cervical spinal stenosis; Radiculopathy, cervical region; Lumbar central spinal stenosis, 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 Pain Assessment: Severity of   is reported as a  /10. Location:    / . Onset:  . Quality:  . Timing:  . Modifying factor(s):  Marland Kitchen Vitals:  vitals were not taken for this visit.  BMI: Estimated body mass index is 26.83 kg/m as calculated from the following:   Height as of 02/25/23: 5'  (1.524 m).   Weight as of 02/25/23: 137 lb 6.4 oz (62.3 kg). Last encounter: 04/07/2023. Last procedure: 12/17/2022.  Reason for encounter: medication management. ***  RTCB: 07/09/2023   Pharmacotherapy Assessment  Analgesic: Oxycodone/APAP 7.5/325, 1 tab PO q 5 times a day. (56.25 mg/day of oxycodone) MME/day: 56.25 mg/day.  Monitoring: [place] PMP: PDMP reviewed during this encounter.       Pharmacotherapy: No side-effects or adverse reactions reported. Compliance: No problems identified. Effectiveness: Clinically acceptable.  No notes on file  No results found for: "CBDTHCR" No results found for: "D8THCCBX" No results found for: "D9THCCBX"  UDS:  Summary  Date Value Ref Range Status  01/01/2023 Note  Final    Comment:    ==================================================================== [employer] [facility] [employer] (MW) ==================================================================== Test                             Result       Flag       Units  Drug Present and Declared for Prescription Verification   Oxycodone                      7012         EXPECTED   ng/mg creat   Oxymorphone                    2293         EXPECTED   ng/mg creat   Noroxycodone                   9132         EXPECTED   ng/mg creat   Noroxymorphone                 590          EXPECTED   ng/mg creat    Sources of oxycodone are scheduled prescription medications.    Oxymorphone, noroxycodone, and noroxymorphone are expected    metabolites of oxycodone. Oxymorphone is also available as a    scheduled prescription medication.  ==================================================================== Test                      Result    Flag   Units      Ref Range   Creatinine              41               mg/dL      >=16 ==================================================================== Declared Medications:  The flagging and interpretation on this report are based on the  following declared medications.   Unexpected results may arise from  inaccuracies in the declared medications.   **Note: The testing scope of this panel includes these medications:   Oxycodone (Percocet)   **Note: The testing scope of this panel does not include the  following reported medications:   Acetaminophen (Percocet)  Atorvastatin (Lipitor)  Calcium  Cyanocobalamin  Docusate (Colace)  Duloxetine (Cymbalta)  Enalapril (Vasotec)  Estradiol (Estrace)  Furosemide (Lasix)  Gabapentin (Neurontin)  Magnesium  Melatonin  Methocarbamol (Robaxin)  Multivitamin  Naloxone (Narcan)  Omeprazole (Prilosec)  Spironolactone (Aldactone)  Supplement  Vitamin D ==================================================================== For clinical consultation, please call [phone]. ====================================================================       ROS  Constitutional: Denies any fever or chills Gastrointestinal: No reported hemesis, hematochezia, vomiting, or acute GI distress Musculoskeletal: Denies any acute onset joint swelling, redness, loss of ROM, or weakness Neurological: No reported episodes of acute onset apraxia, aphasia, dysarthria, agnosia, amnesia, paralysis, loss of coordination, or loss of consciousness  Medication Review  Calcium-Vitamin D-Vitamin K, Cyanocobalamin, DULoxetine, Magnesium, Melatonin, atorvastatin, docusate sodium, enalapril, estradiol, furosemide, gabapentin, methocarbamol, multivitamin with minerals, naloxone, omeprazole, oxyCODONE-acetaminophen, and spironolactone  History Review  Allergy: Ms. Elizabeth has No Known Allergies. Drug: Ms. Harer  reports no history of drug use. Alcohol:  reports no history of alcohol use. Tobacco:  reports that she quit smoking about 19 years ago. Her smoking use included cigarettes. She has never used smokeless tobacco. Social: Ms. Fazzone  reports that she quit smoking about 19 years ago. Her smoking use included cigarettes. She has  never used smokeless tobacco. She reports that she does not drink alcohol and does not use drugs. Medical:  has a past medical history of Absolute anemia (04/11/2015), Acute postoperative pain (08/07/2017), Angina pectoris (HCC), Anxiety, Arthritis, Arthropathy of sacroiliac joint (04/11/2015), Atypical face pain (04/24/2015), Back pain, CAD (coronary artery disease), Cancer (HCC), Chronic back pain, Constipation, Depression, Fibromyalgia, GERD (gastroesophageal reflux disease), 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) (04/11/2015), H/O: hysterectomy (1979), Heart murmur, Hyperlipidemia, Hypertension, Low back pain (04/06/2015), Lumbar radicular pain (04/18/2015), Migraine, Narrowing of intervertebral disc space (04/11/2015), Sacroiliac joint pain (04/11/2015), and Spine disorder. Surgical: Ms. Scuderi  has a past surgical history that includes Cholecystectomy; Abdominal hysterectomy; Neck surgery; Colonoscopy with propofol (N/A, 07/19/2015); caract surger; Laparoscopic salpingo oophorectomy (Bilateral, 03/03/2018); Cystoscopy (03/03/2018); Tonsillectomy; Appendectomy; Tumor removal; Anterior lumbar fusion (N/A, 12/11/2018); Abdominal exposure (N/A, 12/11/2018); Anterior cervical decomp/discectomy fusion (N/A, 10/06/2020); Toe Fusion (Right); Esophagogastroduodenoscopy (egd) with propofol (N/A, 09/11/2021); Colonoscopy with propofol (N/A, 09/11/2021); Colonoscopy with propofol (N/A, 03/05/2022); and Transforaminal lumbar interbody fusion w/ mis 1 level (Right, 04/30/2022). Family: family history includes Colon cancer in her maternal grandmother and mother; Diabetes in her father and mother; Hypertension in her sister; Stroke in her mother.  Laboratory Chemistry Profile   Renal Lab Results  Component Value Date   BUN 12 01/13/2023   CREATININE 1.00 01/13/2023   BCR 12 01/13/2023   GFRAA 47 (L) 12/16/2019   GFRNONAA >60 04/24/2022    Hepatic Lab Results  Component Value Date   AST 15 01/13/2023   ALT 11  01/13/2023   ALBUMIN 3.7 (L) 01/13/2023   ALKPHOS 52 01/13/2023    Electrolytes Lab Results  Component Value Date   NA 141 01/13/2023   K 4.4 01/13/2023   CL 103 01/13/2023   CALCIUM 9.1 01/13/2023   MG 2.4 (H) 12/16/2019    Bone Lab Results  Component Value Date   VD25OH 33.0 01/13/2023   25OHVITD1 53 12/16/2019   25OHVITD2 <1.0 12/16/2019   25OHVITD3 53 12/16/2019    Inflammation (CRP: Acute Phase) (ESR: Chronic Phase) Lab Results  Component Value Date   CRP 4 12/16/2019   ESRSEDRATE 6 12/16/2019         Note: Above Lab results reviewed.  Recent Imaging Review  CT LUMBAR SPINE WO CONTRAST CLINICAL DATA:  Worsening radiculopathy. Pain radiating down the right leg.  EXAM: CT LUMBAR SPINE WITHOUT CONTRAST  TECHNIQUE: Multidetector CT imaging of the lumbar spine was performed without intravenous contrast administration. Multiplanar CT image reconstructions were also generated.  RADIATION DOSE REDUCTION: This exam was performed according to the departmental dose-optimization program which includes automated exposure control, adjustment of the mA and/or kV according to patient size and/or use of iterative reconstruction technique.  COMPARISON:  08/22/2022  FINDINGS: Segmentation: 5 lumbar type vertebrae.  Alignment: Normal.  Vertebrae: No acute fracture or aggressive osseous lesion.  Paraspinal and other soft tissues: Negative.  Other: Abdominal aortic atherosclerosis.  Disc levels:  Disc spaces: Posterior lumbar interbody fusion at L4-5. Anterior lumbar interbody fusion at L5-S1. Solid osseous fusion across the L4-5 and L5-S1 disc spaces. Degenerative disease with disc  height loss at L2-3 and L3-4.  T12-L1: No disc protrusion, foraminal stenosis or central canal stenosis.  L1-L2: No disc protrusion, foraminal stenosis or central canal stenosis.  L2-L3: Broad-based disc bulge. Mild bilateral facet arthropathy. No foraminal or central canal  stenosis.  L3-L4: Broad-based disc bulge. Mild bilateral facet arthropathy. No foraminal stenosis. No spinal stenosis.  L4-L5: Interbody fusion. Beam hardening artifact partially obscures the adjacent soft tissue and osseous structures. No definite foraminal stenosis.  L5-S1: Interbody fusion. No foraminal or central canal stenosis. Moderate-severe bilateral facet arthropathy.  IMPRESSION: 1. Posterior lumbar interbody fusion at L4-5. Anterior lumbar interbody fusion at L5-S1. Solid osseous fusion across the L4-5 and L5-S1 disc spaces. Moderate-severe bilateral facet arthropathy at L5-S1. 2. Lumbar spine spondylosis as described above. 3. No acute osseous injury of the lumbar spine. 4.  Aortic Atherosclerosis (ICD10-I70.0).  Electronically Signed   By: Elige Ko M.D.   On: 04/07/2023 08:46 Note: Reviewed        Physical Exam  General appearance: Well nourished, well developed, and well hydrated. In no apparent acute distress Mental status: Alert, oriented x 3 (person, place, & time)       Respiratory: No evidence of acute respiratory distress Eyes: PERLA Vitals: There were no vitals taken for this visit. BMI: Estimated body mass index is 26.83 kg/m as calculated from the following:   Height as of 02/25/23: 5' (1.524 m).   Weight as of 02/25/23: 137 lb 6.4 oz (62.3 kg). Ideal: Patient weight not recorded  Assessment   Diagnosis Status  1. Chronic pain syndrome   2. Chronic low back pain (Bilateral) (R>L) w/o sciatica   3. Chronic lower extremity pain (Bilateral) (R>L)   4. Chronic hip pain (Bilateral) (L>R)   5. Chronic sacroiliac joint pain (Bilateral) (L>R)   6. Chronic neck pain (Left)   7. Lumbar facet syndrome (Bilateral) (R>L)   8. Trochanteric bursitis of hip (Bilateral) (L>R)   9. Failed back surgical syndrome   10. Pharmacologic therapy   11. Chronic use of opiate for therapeutic purpose   12. Encounter for medication management   13. Encounter for  chronic pain management    Controlled Controlled Controlled   Updated Problems: No problems updated.  Plan of Care  Problem-specific:  No problem-specific Assessment & Plan notes found for this encounter.  Ms. HARVEEN LALA has a current medication list which includes the following long-term medication(s): atorvastatin, viactiv calcium plus d, cyanocobalamin, duloxetine, enalapril, estradiol, furosemide, gabapentin, omeprazole, oxycodone-acetaminophen, and spironolactone.  Pharmacotherapy (Medications Ordered): No orders of the defined types were placed in this encounter.  Orders:  No orders of the defined types were placed in this encounter.  Follow-up plan:   No follow-ups on file.      Interventional Therapies  Risk Factors  Considerations:     Lumbar HARDWARE (NO RFA)     Planned  Pending:   Diagnostic bilateral lumbar MBB (pedicle screw hardware inj.)  #1 (12/17/2022)  Diagnostic/therapeutic midline caudal ESI #2 + sacral interspinous ligament MNB/TPI #1    Under consideration:   Diagnostic lumbar SCS trial    Completed:   Diagnostic bilateral lumbar MBB (pedicle screw hardware inj.) x1 (12/17/2022)  Diagnostic bilateral L4 TFESI x1 (01/08/2022) (100/25/0)  Therapeutic right Racz procedure x1 (08/17/2020) (04/09/84/85) (100% relief of the right LEP) Diagnostic caudal ESI x1 (07/11/2020) (100/100/0/0)  Palliative left L5-S1 LESI x2 (07/27/2015) (70/70/0/0)  Diagnostic left lumbar facet MBB x7 (02/07/2022) (20/20/0/0)  Palliative right lumbar facet MBB x10 (02/07/2022) (  20/20/0/0)  Therapeutic right shoulder joint inj. x1 (02/29/2016) (90/80/30)  Therapeutic left IA hip injection x3 (06/13/2020) (100/100/30/>50)  Diagnostic right SI joint Blk x3 (06/17/2017) (100/90/80/80)  Diagnostic/therapeutic left SI joint Blk x4 (06/17/2017) (100/90/80/80)  Palliative bilateral trochanteric bursa inj. x1 (12/11/2016) (100/100/90/90)  Therapeutic right lumbar facet RFA x2  (01/11/2021) (100/100/70)  Therapeutic left lumbar facet RFA x2 (12/19/2020) (100/100/75/>75) Therapeutic right SI joint RFA x1 (08/07/2017) (100/100/80)  Therapeutic left SI joint RFA x1 (09/16/2017) (100/100/100)    Therapeutic  Palliative (PRN) options:   Palliative Racz procedure  Palliative bilateral SI joint block  Palliative right trochanteric bursa injection    Pharmacotherapy  Nonopioids transferred 05/24/2020: Vitamin B12, Flexeril, and Neurontin.          Recent Visits Date Type Provider Dept  04/07/23 Office Visit Delano Metz, MD [facility]  Showing recent visits within past 90 days and meeting all other requirements Future Appointments Date Type Provider Dept  04/09/23 Appointment Delano Metz, MD [facility]  Showing future appointments within next 90 days and meeting all other requirements  I discussed the assessment and treatment plan with the patient. The patient was provided an opportunity to ask questions and all were answered. The patient agreed with the plan and demonstrated an understanding of the instructions.  Patient advised to call back or seek an in-person evaluation if the symptoms or condition worsens.  Duration of encounter: *** minutes.  Total time on encounter, as per AMA guidelines included both the face-to-face and non-face-to-face time personally spent by the physician and/or other qualified health care professional(s) on the day of the encounter (includes time in activities that require the physician or other qualified health care professional and does not include time in activities normally performed by clinical staff). Physician's time may include the following activities when performed: Preparing to see the patient (e.g., pre-charting review of records, searching for previously ordered imaging, lab work, and nerve conduction tests) Review of prior analgesic pharmacotherapies. Reviewing PMP Interpreting ordered  tests (e.g., lab work, imaging, nerve conduction tests) Performing post-procedure evaluations, including interpretation of diagnostic procedures Obtaining and/or reviewing separately obtained history Performing a medically appropriate examination and/or evaluation Counseling and educating the patient/family/caregiver Ordering medications, tests, or procedures Referring and communicating with other health care professionals (when not separately reported) Documenting clinical information in the electronic or other health record Independently interpreting results (not separately reported) and communicating results to the patient/ family/caregiver Care coordination (not separately reported)  Note by: Oswaldo Done, MD Date: 04/09/2023; Time: 3:26 PM

## 2023-04-08 NOTE — Patient Instructions (Signed)
____________________________________________________________________________________________  Opioid Pain Medication Update  To: All patients taking opioid pain medications. (I.e.: hydrocodone, hydromorphone, oxycodone, oxymorphone, morphine, codeine, methadone, tapentadol, tramadol, buprenorphine, fentanyl, etc.)  Re: Updated review of side effects and adverse reactions of opioid analgesics, as well as new information about long term effects of this class of medications.  Direct risks of long-term opioid therapy are not limited to opioid addiction and overdose. Potential medical risks include serious fractures, breathing problems during sleep, hyperalgesia, immunosuppression, chronic constipation, bowel obstruction, myocardial infarction, and tooth decay secondary to xerostomia.  Unpredictable adverse effects that can occur even if you take your medication correctly: Cognitive impairment, respiratory depression, and death. Most people think that if they take their medication "correctly", and "as instructed", that they will be safe. Nothing could be farther from the truth. In reality, a significant amount of recorded deaths associated with the use of opioids has occurred in individuals that had taken the medication for a long time, and were taking their medication correctly. The following are examples of how this can happen: Patient taking his/her medication for a long time, as instructed, without any side effects, is given a certain antibiotic or another unrelated medication, which in turn triggers a "Drug-to-drug interaction" leading to disorientation, cognitive impairment, impaired reflexes, respiratory depression or an untoward event leading to serious bodily harm or injury, including death.  Patient taking his/her medication for a long time, as instructed, without any side effects, develops an acute impairment of liver and/or kidney function. This will lead to a rapid inability of the body to  breakdown and eliminate their pain medication, which will result in effects similar to an "overdose", but with the same medicine and dose that they had always taken. This again may lead to disorientation, cognitive impairment, impaired reflexes, respiratory depression or an untoward event leading to serious bodily harm or injury, including death.  A similar problem will occur with patients as they grow older and their liver and kidney function begins to decrease as part of the aging process.  Background information: Historically, the original case for using long-term opioid therapy to treat chronic noncancer pain was based on safety assumptions that subsequent experience has called into question. In 1996, the American Pain Society and the American Academy of Pain Medicine issued a consensus statement supporting long-term opioid therapy. This statement acknowledged the dangers of opioid prescribing but concluded that the risk for addiction was low; respiratory depression induced by opioids was short-lived, occurred mainly in opioid-naive patients, and was antagonized by pain; tolerance was not a common problem; and efforts to control diversion should not constrain opioid prescribing. This has now proven to be wrong. Experience regarding the risks for opioid addiction, misuse, and overdose in community practice has failed to support these assumptions.  According to the Centers for Disease Control and Prevention, fatal overdoses involving opioid analgesics have increased sharply over the past decade. Currently, more than 96,700 people die from drug overdoses every year. Opioids are a factor in 7 out of every 10 overdose deaths. Deaths from drug overdose have surpassed motor vehicle accidents as the leading cause of death for individuals between the ages of 80 and 61.  Clinical data suggest that neuroendocrine dysfunction may be very common in both men and women, potentially causing hypogonadism, erectile  dysfunction, infertility, decreased libido, osteoporosis, and depression. Recent studies linked higher opioid dose to increased opioid-related mortality. Controlled observational studies reported that long-term opioid therapy may be associated with increased risk for cardiovascular events. Subsequent meta-analysis concluded  that the safety of long-term opioid therapy in elderly patients has not been proven.   Side Effects and adverse reactions: Common side effects: Drowsiness (sedation). Dizziness. Nausea and vomiting. Constipation. Physical dependence -- Dependence often manifests with withdrawal symptoms when opioids are discontinued or decreased. Tolerance -- As you take repeated doses of opioids, you require increased medication to experience the same effect of pain relief. Respiratory depression -- This can occur in healthy people, especially with higher doses. However, people with COPD, asthma or other lung conditions may be even more susceptible to fatal respiratory impairment.  Uncommon side effects: An increased sensitivity to feeling pain and extreme response to pain (hyperalgesia). Chronic use of opioids can lead to this. Delayed gastric emptying (the process by which the contents of your stomach are moved into your small intestine). Muscle rigidity. Immune system and hormonal dysfunction. Quick, involuntary muscle jerks (myoclonus). Arrhythmia. Itchy skin (pruritus). Dry mouth (xerostomia).  Long-term side effects: Chronic constipation. Sleep-disordered breathing (SDB). Increased risk of bone fractures. Hypothalamic-pituitary-adrenal dysregulation. Increased risk of overdose.  RISKS: Respiratory depression and death: Opioids increase the risk of respiratory depression and death.  Drug-to-drug interactions: Opioids are relatively contraindicated in combination with benzodiazepines, sleep inducers, and other central nervous system depressants. Other classes of medications  (i.e.: certain antibiotics and even over-the-counter medications) may also trigger or induce respiratory depression in some patients.  Medical conditions: Patients with pre-existing respiratory problems are at higher risk of respiratory failure and/or depression when in combination with opioid analgesics. Opioids are relatively contraindicated in some medical conditions such as central sleep apnea.   Fractures and Falls:  Opioids increase the risk and incidence of falls. This is of particular importance in elderly patients.  Endocrine System:  Long-term administration is associated with endocrine abnormalities (endocrinopathies). (Also known as Opioid-induced Endocrinopathy) Influences on both the hypothalamic-pituitary-adrenal axis?and the hypothalamic-pituitary-gonadal axis have been demonstrated with consequent hypogonadism and adrenal insufficiency in both sexes. Hypogonadism and decreased levels of dehydroepiandrosterone sulfate have been reported in men and women. Endocrine effects include: Amenorrhoea in women (abnormal absence of menstruation) Reduced libido in both sexes Decreased sexual function Erectile dysfunction in men Hypogonadisms (decreased testicular function with shrinkage of testicles) Infertility Depression and fatigue Loss of muscle mass Anxiety Depression Immune suppression Hyperalgesia Weight gain Anemia Osteoporosis Patients (particularly women of childbearing age) should avoid opioids. There is insufficient evidence to recommend routine monitoring of asymptomatic patients taking opioids in the long-term for hormonal deficiencies.  Immune System: Human studies have demonstrated that opioids have an immunomodulating effect. These effects are mediated via opioid receptors both on immune effector cells and in the central nervous system. Opioids have been demonstrated to have adverse effects on antimicrobial response and anti-tumour surveillance. Buprenorphine has  been demonstrated to have no impact on immune function.  Opioid Induced Hyperalgesia: Human studies have demonstrated that prolonged use of opioids can lead to a state of abnormal pain sensitivity, sometimes called opioid induced hyperalgesia (OIH). Opioid induced hyperalgesia is not usually seen in the absence of tolerance to opioid analgesia. Clinically, hyperalgesia may be diagnosed if the patient on long-term opioid therapy presents with increased pain. This might be qualitatively and anatomically distinct from pain related to disease progression or to breakthrough pain resulting from development of opioid tolerance. Pain associated with hyperalgesia tends to be more diffuse than the pre-existing pain and less defined in quality. Management of opioid induced hyperalgesia requires opioid dose reduction.  Cancer: Chronic opioid therapy has been associated with an increased risk of cancer  among noncancer patients with chronic pain. This association was more evident in chronic strong opioid users. Chronic opioid consumption causes significant pathological changes in the small intestine and colon. Epidemiological studies have found that there is a link between opium dependence and initiation of gastrointestinal cancers. Cancer is the second leading cause of death after cardiovascular disease. Chronic use of opioids can cause multiple conditions such as GERD, immunosuppression and renal damage as well as carcinogenic effects, which are associated with the incidence of cancers.   Mortality: Long-term opioid use has been associated with increased mortality among patients with chronic non-cancer pain (CNCP).  Prescription of long-acting opioids for chronic noncancer pain was associated with a significantly increased risk of all-cause mortality, including deaths from causes other than overdose.  Reference: Von Korff M, Kolodny A, Deyo RA, Chou R. Long-term opioid therapy reconsidered. Ann Intern Med. 2011  Sep 6;155(5):325-8. doi: 10.7326/[id number]. PMID: 64403474; PMCID: [id number]. Randon Goldsmith, Hayward RA, Dunn KM, [place] KP. Risk of adverse events in patients prescribed long-term opioids: A cohort study in the [place] [facility] Research Datalink. Eur J Pain. 2019 May;23(5):908-922. doi: 10.1002/ejp.1357. Epub 2019 Jan 31. PMID: 43329518. Colameco S, Coren JS, Ciervo CA. Continuous opioid treatment for chronic noncancer pain: a time for moderation in prescribing. Postgrad Med. 2009 Jul;121(4):61-6. doi: 10.3810/pgm.2009.07.2032. PMID: 84166063. William Hamburger RN, [place] SD, Blazina I, Cristopher Peru, Bougatsos C, Deyo RA. The effectiveness and risks of long-term opioid therapy for chronic pain: a systematic review for a [employer] of [employer] Pathways to [employer]. Ann Intern Med. 2015 Feb 17;162(4):276-86. doi: 10.7326/M14-2559. PMID: 01601093. Caryl Bis [facility], Makuc DM. NCHS Data Brief No. 22. [place]: Centers for Disease Control and Prevention; 2009. Sep, Increase in Fatal Poisonings Involving Opioid Analgesics in the [place], 1999-2006. Song IA, Choi HR, Oh TK. Long-term opioid use and mortality in patients with chronic non-cancer pain: Ten-year follow-up study in [place] from 2010 through 2019. EClinicalMedicine. 2022 Jul 18;51:101558. doi: 10.1016/j.eclinm.2022.235573. PMID: 22025427; PMCID: [id number]. Huser, W., Schubert, T., Vogelmann, T. et al. All-cause mortality in patients with long-term opioid therapy compared with non-opioid analgesics for chronic non-cancer pain: a database study. BMC Med 18, 162 (2020). http://lester.info/ Rashidian H, Karie Kirks, Malekzadeh R, Haghdoost AA. An Ecological Study of the Association between Opiate Use and Incidence of Cancers. Addict Health. 2016 Fall;8(4):252-260. PMID: 51761607; PMCID: [id number].  Our Goal: Our goal is to control your  pain with means other than the use of opioid pain medications.  Our Recommendation: Talk to your physician about coming off of these medications. We can assist you with the tapering down and stopping these medicines. Based on the new information, even if you cannot completely stop the medication, a decrease in the dose may be associated with a lesser risk. Ask for other means of controlling the pain. Decrease or eliminate those factors that significantly contribute to your pain such as smoking, obesity, and a diet heavily tilted towards "inflammatory" nutrients.  Last Updated: 01/06/2023   ____________________________________________________________________________________________     ____________________________________________________________________________________________  National Pain Medication Shortage  The [place] is experiencing worsening drug shortages. These have had a negative widespread effect on patient care and treatment. Not expected to improve any time soon. Predicted to last past 2029.   Drug shortage list (generic names) Oxycodone IR Oxycodone/APAP Oxymorphone IR Hydromorphone Hydrocodone/APAP Morphine  Where is the problem?  Manufacturing and supply level.  Will this shortage affect you?  Only if you  take any of the above pain medications.  How? You may be unable to fill your prescription.  Your pharmacist may offer a "partial fill" of your prescription. (Warning: Do not accept partial fills.) Prescriptions partially filled cannot be transferred to another pharmacy. Read our Medication Rules and Regulation. Depending on how much medicine you are dependent on, you may experience withdrawals when unable to get the medication.  Recommendations: Consider ending your dependence on opioid pain medications. Ask your pain specialist to assist you with the process. Consider switching to a medication currently not in shortage, such as Buprenorphine. Talk to your pain  specialist about this option. Consider decreasing your pain medication requirements by managing tolerance thru "Drug Holidays". This may help minimize withdrawals, should you run out of medicine. Control your pain thru the use of non-pharmacological interventional therapies.   Your prescriber: Prescribers cannot be blamed for shortages. Medication manufacturing and supply issues cannot be fixed by the prescriber.   NOTE: The prescriber is not responsible for supplying the medication, or solving supply issues. Work with your pharmacist to solve it. The patient is responsible for the decision to take or continue taking the medication and for identifying and securing a legal supply source. By law, supplying the medication is the job and responsibility of the pharmacy. The prescriber is responsible for the evaluation, monitoring, and prescribing of these medications.   Prescribers will NOT: Re-issue prescriptions that have been partially filled. Re-issue prescriptions already sent to a pharmacy.  Re-send prescriptions to a different pharmacy because yours did not have your medication. Ask pharmacist to order more medicine or transfer the prescription to another pharmacy. (Read below.)  New 2023 regulation: "March 01, 2022 Revised Regulation Allows DEA-Registered Pharmacies to Transfer Electronic Prescriptions at a Patient's Request DEA Headquarters Division - Public Information Office Patients now have the ability to request their electronic prescription be transferred to another pharmacy without having to go back to their practitioner to initiate the request. This revised regulation went into effect on Monday, February 25, 2022.     At a patient's request, a DEA-registered retail pharmacy can now transfer an electronic prescription for a controlled substance (schedules II-V) to another DEA-registered retail pharmacy. Prior to this change, patients would have to go through their practitioner to  cancel their prescription and have it re-issued to a different pharmacy. The process was taxing and time consuming for both patients and practitioners.    The Drug Enforcement Administration [facility]) published its intent to revise the process for transferring electronic prescriptions on May 19, 2020.  The final rule was published in the federal register on January 24, 2022 and went into effect 30 days later.  Under the final rule, a prescription can only be transferred once between pharmacies, and only if allowed under existing state or other applicable law. The prescription must remain in its electronic form; may not be altered in any way; and the transfer must be communicated directly between two licensed pharmacists. It's important to note, any authorized refills transfer with the original prescription, which means the entire prescription will be filled at the same pharmacy".  Reference: [email or web address].is [facility] website announcement)  [email or web address] [employer] of Justice)   [employer] / Vol. 88, No. 143 / Thursday, January 24, 2022 / Rules and Regulations DEPARTMENT OF JUSTICE  Drug Enforcement Administration  21 CFR Part 1306  [Docket No. [id number]]  RIN [id number] Transfer of Electronic Prescriptions for Schedules II-V Controlled Substances Between Pharmacies for Initial Filling  ____________________________________________________________________________________________  ____________________________________________________________________________________________  Transfer of Pain Medication between Pharmacies  Re: 2023 DEA Clarification on existing regulation  Published on DEA Website: March 01, 2022  Title: Revised Regulation Allows DEA-Registered Pharmacies to Electrical engineer Prescriptions at a Patient's  Request DEA Headquarters Division - [employer]  "Patients now have the ability to request their electronic prescription be transferred to another pharmacy without having to go back to their practitioner to initiate the request. This revised regulation went into effect on Monday, February 25, 2022.     At a patient's request, a DEA-registered retail pharmacy can now transfer an electronic prescription for a controlled substance (schedules II-V) to another DEA-registered retail pharmacy. Prior to this change, patients would have to go through their practitioner to cancel their prescription and have it re-issued to a different pharmacy. The process was taxing and time consuming for both patients and practitioners.    The Drug Enforcement Administration [facility]) published its intent to revise the process for transferring electronic prescriptions on May 19, 2020.  The final rule was published in the federal register on January 24, 2022 and went into effect 30 days later.  Under the final rule, a prescription can only be transferred once between pharmacies, and only if allowed under existing state or other applicable law. The prescription must remain in its electronic form; may not be altered in any way; and the transfer must be communicated directly between two licensed pharmacists. It's important to note, any authorized refills transfer with the original prescription, which means the entire prescription will be filled at the same pharmacy."    REFERENCES: 1. DEA website announcement [email or web address]  2. Department of Justice website  [email or web address]  3. DEPARTMENT OF JUSTICE Drug Enforcement Administration 21 CFR Part 1306 [Docket No. [id number]] RIN [id number] "Transfer of Electronic Prescriptions for Schedules II-V Controlled Substances  Between Pharmacies for Initial Filling"  ____________________________________________________________________________________________     _______________________________________________________________________  Medication Rules  Purpose: To inform patients, and their family members, of our medication rules and regulations.  Applies to: All patients receiving prescriptions from our practice (written or electronic).  Pharmacy of record: This is the pharmacy where your electronic prescriptions will be sent. Make sure we have the correct one.  Electronic prescriptions: In compliance with the [facility] Strengthen Opioid Misuse Prevention (STOP) Act of 2017 (Session Conni Elliot [phone]), effective July 01, 2018, all controlled substances must be electronically prescribed. Written prescriptions, faxing, or calling prescriptions to a pharmacy will no longer be done.  Prescription refills: These will be provided only during in-person appointments. No medications will be renewed without a "face-to-face" evaluation with your provider. Applies to all prescriptions.  NOTE: The following applies primarily to controlled substances (Opioid* Pain Medications).   Type of encounter (visit): For patients receiving controlled substances, face-to-face visits are required. (Not an option and not up to the patient.)  Patient's responsibilities: Pain Pills: Bring all pain pills to every appointment (except for procedure appointments). Pill Bottles: Bring pills in original pharmacy bottle. Bring bottle, even if empty. Always bring the bottle of the most recent fill.  Medication refills: You are responsible for knowing and keeping track of what medications you are taking and when is it that you will need a refill. The day before your appointment: write a list of all prescriptions that need to be refilled. The day of the appointment: give the list to the admitting nurse. Prescriptions will be written only  during appointments. No prescriptions will be written on procedure days. If you forget a  medication: it will not be "Called in", "Faxed", or "electronically sent". You will need to get another appointment to get these prescribed. No early refills. Do not call asking to have your prescription filled early. Partial  or short prescriptions: Occasionally your pharmacy may not have enough pills to fill your prescription.  NEVER ACCEPT a partial fill or a prescription that is short of the total amount of pills that you were prescribed.  With controlled substances the law allows 72 hours for the pharmacy to complete the prescription.  If the prescription is not completed within 72 hours, the pharmacist will require a new prescription to be written. This means that you will be short on your medicine and we WILL NOT send another prescription to complete your original prescription.  Instead, request the pharmacy to send a carrier to a nearby branch to get enough medication to provide you with your full prescription. Prescription Accuracy: You are responsible for carefully inspecting your prescriptions before leaving our office. Have the discharge nurse carefully go over each prescription with you, before taking them home. Make sure that your name is accurately spelled, that your address is correct. Check the name and dose of your medication to make sure it is accurate. Check the number of pills, and the written instructions to make sure they are clear and accurate. Make sure that you are given enough medication to last until your next medication refill appointment. Taking Medication: Take medication as prescribed. When it comes to controlled substances, taking less pills or less frequently than prescribed is permitted and encouraged. Never take more pills than instructed. Never take the medication more frequently than prescribed.  Inform other Doctors: Always inform, all of your healthcare providers, of all the  medications you take. Pain Medication from other Providers: You are not allowed to accept any additional pain medication from any other Doctor or Healthcare provider. There are two exceptions to this rule. (see below) In the event that you require additional pain medication, you are responsible for notifying us, as stated below. Cough Medicine: Often these contain an opioid, such as codeine or hydrocodone. Never accept or take cough medicine containing these opioids if you are already taking an opioid* medication. The combination may cause respiratory failure and death. Medication Agreement: You are responsible for carefully reading and following our Medication Agreement. This must be signed before receiving any prescriptions from our practice. Safely store a copy of your signed Agreement. Violations to the Agreement will result in no further prescriptions. (Additional copies of our Medication Agreement are available upon request.) Laws, Rules, & Regulations: All patients are expected to follow all [address] and [employer], [employer], Rules, [place] [employer]. Ignorance of the Laws does not constitute a valid excuse.  Illegal drugs and Controlled Substances: The use of illegal substances (including, but not limited to marijuana and its derivatives) and/or the illegal use of any controlled substances is strictly prohibited. Violation of this rule may result in the immediate and permanent discontinuation of any and all prescriptions being written by our practice. The use of any illegal substances is prohibited. Adopted CDC guidelines & recommendations: Target dosing levels will be at or below 60 MME/day. Use of benzodiazepines** is not recommended.  Exceptions: There are only two exceptions to the rule of not receiving pain medications from other Healthcare Providers. Exception #1 (Emergencies): In the event of an emergency (i.e.: accident requiring emergency care), you are allowed to receive additional pain  medication. However, you are responsible for: As soon as  you are able, call our office [phone], at any time of the day or night, and leave a message stating your name, the date and nature of the emergency, and the name and dose of the medication prescribed. In the event that your call is answered by a member of our staff, make sure to document and save the date, time, and the name of the person that took your information.  Exception #2 (Planned Surgery): In the event that you are scheduled by another doctor or dentist to have any type of surgery or procedure, you are allowed (for a period no longer than 30 days), to receive additional pain medication, for the acute post-op pain. However, in this case, you are responsible for picking up a copy of our "Post-op Pain Management for Surgeons" handout, and giving it to your surgeon or dentist. This document is available at our office, and does not require an appointment to obtain it. Simply go to our office during business hours (Monday-Thursday from 8:00 AM to 4:00 PM) (Friday 8:00 AM to 12:00 Noon) or if you have a scheduled appointment with [place], prior to your surgery, and ask for it by name. In addition, you are responsible for: calling our office [phone], at any time of the day or night, and leaving a message stating your name, name of your surgeon, type of surgery, and date of procedure or surgery. Failure to comply with your responsibilities may result in termination of therapy involving the controlled substances. Medication Agreement Violation. Following the above rules, including your responsibilities will help you in avoiding a Medication Agreement Violation ("Breaking your Pain Medication Contract").  Consequences:  Not following the above rules may result in permanent discontinuation of medication prescription therapy.  *Opioid medications include: morphine, codeine, oxycodone, oxymorphone, hydrocodone, hydromorphone, meperidine, tramadol,  tapentadol, buprenorphine, fentanyl, methadone. **Benzodiazepine medications include: diazepam (Valium), alprazolam (Xanax), clonazepam (Klonopine), lorazepam (Ativan), clorazepate (Tranxene), chlordiazepoxide (Librium), estazolam (Prosom), oxazepam (Serax), temazepam (Restoril), triazolam (Halcion) (Last updated: 04/23/2022) ______________________________________________________________________    ______________________________________________________________________  Medication Recommendations and Reminders  Applies to: All patients receiving prescriptions (written and/or electronic).  Medication Rules & Regulations: You are responsible for reading, knowing, and following our "Medication Rules" document. These exist for your safety and that of others. They are not flexible and neither are we. Dismissing or ignoring them is an act of "non-compliance" that may result in complete and irreversible termination of such medication therapy. For safety reasons, "non-compliance" will not be tolerated. As with the [place] fundamental legal principle of "ignorance of the law is no defense", we will accept no excuses for not having read and knowing the content of documents provided to you by our practice.  Pharmacy of record:  Definition: This is the pharmacy where your electronic prescriptions will be sent.  We do not endorse any particular pharmacy. It is up to you and your insurance to decide what pharmacy to use.  We do not restrict you in your choice of pharmacy. However, once we write for your prescriptions, we will NOT be re-sending more prescriptions to fix restricted supply problems created by your pharmacy, or your insurance.  The pharmacy listed in the electronic medical record should be the one where you want electronic prescriptions to be sent. If you choose to change pharmacy, simply notify our nursing staff. Changes will be made only during your regular appointments and not over the  phone.  Recommendations: Keep all of your pain medications in a safe place, under lock and key, even  if you live alone. We will NOT replace lost, stolen, or damaged medication. We do not accept "Police Reports" as proof of medications having been stolen. After you fill your prescription, take 1 week's worth of pills and put them away in a safe place. You should keep a separate, properly labeled bottle for this purpose. The remainder should be kept in the original bottle. Use this as your primary supply, until it runs out. Once it's gone, then you know that you have 1 week's worth of medicine, and it is time to come in for a prescription refill. If you do this correctly, it is unlikely that you will ever run out of medicine. To make sure that the above recommendation works, it is very important that you make sure your medication refill appointments are scheduled at least 1 week before you run out of medicine. To do this in an effective manner, make sure that you do not leave the office without scheduling your next medication management appointment. Always ask the nursing staff to show you in your prescription , when your medication will be running out. Then arrange for the receptionist to get you a return appointment, at least 7 days before you run out of medicine. Do not wait until you have 1 or 2 pills left, to come in. This is very poor planning and does not take into consideration that we may need to cancel appointments due to bad weather, sickness, or emergencies affecting our staff. DO NOT ACCEPT A "Partial Fill": If for any reason your pharmacy does not have enough pills/tablets to completely fill or refill your prescription, do not allow for a "partial fill". The law allows the pharmacy to complete that prescription within 72 hours, without requiring a new prescription. If they do not fill the rest of your prescription within those 72 hours, you will need a separate prescription to fill the remaining  amount, which we will NOT provide. If the reason for the partial fill is your insurance, you will need to talk to the pharmacist about payment alternatives for the remaining tablets, but again, DO NOT ACCEPT A PARTIAL FILL, unless you can trust your pharmacist to obtain the remainder of the pills within 72 hours.  Prescription refills and/or changes in medication(s):  Prescription refills, and/or changes in dose or medication, will be conducted only during scheduled medication management appointments. (Applies to both, written and electronic prescriptions.) No refills on procedure days. No medication will be changed or started on procedure days. No changes, adjustments, and/or refills will be conducted on a procedure day. Doing so will interfere with the diagnostic portion of the procedure. No phone refills. No medications will be "called into the pharmacy". No Fax refills. No weekend refills. No Holliday refills. No after hours refills.  Remember:  Business hours are:  Monday to Thursday 8:00 AM to 4:00 PM Provider's Schedule: Delano Metz, MD - Appointments are:  Medication management: Monday and Wednesday 8:00 AM to 4:00 PM Procedure day: Tuesday and Thursday 7:30 AM to 4:00 PM Edward Jolly, MD - Appointments are:  Medication management: Tuesday and Thursday 8:00 AM to 4:00 PM Procedure day: Monday and Wednesday 7:30 AM to 4:00 PM (Last update: 04/23/2022) ______________________________________________________________________   ____________________________________________________________________________________________  Naloxone Nasal Spray  Why am I receiving this medication? [place] STOP ACT requires that all patients taking high dose opioids or at risk of opioids respiratory depression, be prescribed an opioid reversal agent, such as Naloxone (AKA: Narcan).  What is this medication? NALOXONE (  nal OX one) treats opioid overdose, which causes slow or shallow breathing,  severe drowsiness, or trouble staying awake. Call emergency services after using this medication. You may need additional treatment. Naloxone works by reversing the effects of opioids. It belongs to a group of medications called opioid blockers.  COMMON BRAND NAME(S): Kloxxado, Narcan  What should I tell my care team before I take this medication? They need to know if you have any of these conditions: Heart disease Substance use disorder An unusual or allergic reaction to naloxone, other medications, foods, dyes, or preservatives Pregnant or trying to get pregnant Breast-feeding  When to use this medication? This medication is to be used for the treatment of respiratory depression (less than 8 breaths per minute) secondary to opioid overdose.   How to use this medication? This medication is for use in the nose. Lay the person on their back. Support their neck with your hand and allow the head to tilt back before giving the medication. The nasal spray should be given into 1 nostril. After giving the medication, move the person onto their side. Do not remove or test the nasal spray until ready to use. Get emergency medical help right away after giving the first dose of this medication, even if the person wakes up. You should be familiar with how to recognize the signs and symptoms of a narcotic overdose. If more doses are needed, give the additional dose in the other nostril. Talk to your care team about the use of this medication in children. While this medication may be prescribed for children as young as newborns for selected conditions, precautions do apply.  Naloxone Overdosage: If you think you have taken too much of this medicine contact a poison control center or emergency room at once.  NOTE: This medicine is only for you. Do not share this medicine with others.  What if I miss a dose? This does not apply.  What may interact with this medication? This is only used during an  emergency. No interactions are expected during emergency use. This list may not describe all possible interactions. Give your health care provider a list of all the medicines, herbs, non-prescription drugs, or dietary supplements you use. Also tell them if you smoke, drink alcohol, or use illegal drugs. Some items may interact with your medicine.  What should I watch for while using this medication? Keep this medication ready for use in the case of an opioid overdose. Make sure that you have the phone number of your care team and local hospital ready. You may need to have additional doses of this medication. Each nasal spray contains a single dose. Some emergencies may require additional doses. After use, bring the treated person to the nearest hospital or call 911. Make sure the treating care team knows that the person has received a dose of this medication. You will receive additional instructions on what to do during and after use of this medication before an emergency occurs.  What side effects may I notice from receiving this medication? Side effects that you should report to your care team as soon as possible: Allergic reactions--skin rash, itching, hives, swelling of the face, lips, tongue, or throat Side effects that usually do not require medical attention (report these to your care team if they continue or are bothersome): Constipation Dryness or irritation inside the nose Headache Increase in blood pressure Muscle spasms Stuffy nose Toothache This list may not describe all possible side effects. Call your doctor for  medical advice about side effects. You may report side effects to FDA at 1-800-FDA-1088.  Where should I keep my medication? Because this is an emergency medication, you should keep it with you at all times.  Keep out of the reach of children and pets. Store between 20 and 25 degrees C (68 and 77 degrees F). Do not freeze. Throw away any unused medication after the  expiration date. Keep in original box until ready to use.  NOTE: This sheet is a summary. It may not cover all possible information. If you have questions about this medicine, talk to your doctor, pharmacist, or health care provider.   2023 [employer]/Gold Standard (2021-02-23 00:00:00)  ____________________________________________________________________________________________

## 2023-04-09 ENCOUNTER — Encounter: Payer: Self-pay | Admitting: Pain Medicine

## 2023-04-09 ENCOUNTER — Ambulatory Visit: Payer: Medicare Other | Attending: Pain Medicine | Admitting: Pain Medicine

## 2023-04-09 VITALS — BP 125/84 | HR 84 | Temp 97.2°F | Resp 16 | Ht 60.0 in | Wt 136.0 lb

## 2023-04-09 DIAGNOSIS — M79604 Pain in right leg: Secondary | ICD-10-CM | POA: Insufficient documentation

## 2023-04-09 DIAGNOSIS — G894 Chronic pain syndrome: Secondary | ICD-10-CM | POA: Diagnosis not present

## 2023-04-09 DIAGNOSIS — M7062 Trochanteric bursitis, left hip: Secondary | ICD-10-CM | POA: Diagnosis present

## 2023-04-09 DIAGNOSIS — M961 Postlaminectomy syndrome, not elsewhere classified: Secondary | ICD-10-CM | POA: Diagnosis present

## 2023-04-09 DIAGNOSIS — M79605 Pain in left leg: Secondary | ICD-10-CM | POA: Insufficient documentation

## 2023-04-09 DIAGNOSIS — Z79891 Long term (current) use of opiate analgesic: Secondary | ICD-10-CM

## 2023-04-09 DIAGNOSIS — G8929 Other chronic pain: Secondary | ICD-10-CM | POA: Diagnosis present

## 2023-04-09 DIAGNOSIS — M533 Sacrococcygeal disorders, not elsewhere classified: Secondary | ICD-10-CM | POA: Insufficient documentation

## 2023-04-09 DIAGNOSIS — M542 Cervicalgia: Secondary | ICD-10-CM | POA: Insufficient documentation

## 2023-04-09 DIAGNOSIS — M545 Low back pain, unspecified: Secondary | ICD-10-CM | POA: Insufficient documentation

## 2023-04-09 DIAGNOSIS — M25552 Pain in left hip: Secondary | ICD-10-CM | POA: Insufficient documentation

## 2023-04-09 DIAGNOSIS — M47816 Spondylosis without myelopathy or radiculopathy, lumbar region: Secondary | ICD-10-CM

## 2023-04-09 DIAGNOSIS — Z79899 Other long term (current) drug therapy: Secondary | ICD-10-CM | POA: Diagnosis present

## 2023-04-09 DIAGNOSIS — M25551 Pain in right hip: Secondary | ICD-10-CM

## 2023-04-09 DIAGNOSIS — M7061 Trochanteric bursitis, right hip: Secondary | ICD-10-CM

## 2023-04-09 MED ORDER — OXYCODONE-ACETAMINOPHEN 7.5-325 MG PO TABS
1.0000 | ORAL_TABLET | Freq: Every day | ORAL | 0 refills | Status: DC
Start: 2023-06-09 — End: 2023-07-08

## 2023-04-09 MED ORDER — OXYCODONE-ACETAMINOPHEN 7.5-325 MG PO TABS: 1.0000 | ORAL_TABLET | Freq: Every day | ORAL | 0 refills | Status: DC

## 2023-04-09 MED ORDER — OXYCODONE-ACETAMINOPHEN 7.5-325 MG PO TABS
1.0000 | ORAL_TABLET | Freq: Every day | ORAL | 0 refills | Status: DC
Start: 2023-05-10 — End: 2023-07-08

## 2023-04-09 NOTE — Progress Notes (Signed)
Nursing Pain Medication Assessment:  Safety precautions to be maintained throughout the outpatient stay will include: orient to surroundings, keep bed in low position, maintain call bell within reach at all times, provide assistance with transfer out of bed and ambulation.  Medication Inspection Compliance: Pill count conducted under aseptic conditions, in front of the patient. Neither the pills nor the bottle was removed from the patient's sight at any time. Once count was completed pills were immediately returned to the patient in their original bottle.  Medication: Oxycodone/APAP Pill/Patch Count:  12 of 150 pills remain Pill/Patch Appearance: Markings consistent with prescribed medication Bottle Appearance: Standard pharmacy container. Clearly labeled. Filled Date: 101 / 12 / 2024 Last Medication intake:  Today

## 2023-04-15 ENCOUNTER — Ambulatory Visit: Payer: PRIVATE HEALTH INSURANCE | Admitting: Internal Medicine

## 2023-06-03 ENCOUNTER — Encounter: Payer: Self-pay | Admitting: Pain Medicine

## 2023-06-03 ENCOUNTER — Ambulatory Visit: Payer: Medicare Other | Attending: Pain Medicine | Admitting: Pain Medicine

## 2023-06-03 VITALS — BP 150/72 | HR 76 | Temp 97.3°F | Resp 16 | Ht 64.0 in | Wt 131.0 lb

## 2023-06-03 DIAGNOSIS — M47816 Spondylosis without myelopathy or radiculopathy, lumbar region: Secondary | ICD-10-CM | POA: Insufficient documentation

## 2023-06-03 DIAGNOSIS — R937 Abnormal findings on diagnostic imaging of other parts of musculoskeletal system: Secondary | ICD-10-CM | POA: Insufficient documentation

## 2023-06-03 DIAGNOSIS — M5136 Other intervertebral disc degeneration, lumbar region with discogenic back pain only: Secondary | ICD-10-CM | POA: Insufficient documentation

## 2023-06-03 DIAGNOSIS — M545 Low back pain, unspecified: Secondary | ICD-10-CM | POA: Insufficient documentation

## 2023-06-03 DIAGNOSIS — M5459 Other low back pain: Secondary | ICD-10-CM | POA: Diagnosis not present

## 2023-06-03 DIAGNOSIS — G8929 Other chronic pain: Secondary | ICD-10-CM | POA: Diagnosis present

## 2023-06-03 DIAGNOSIS — M47817 Spondylosis without myelopathy or radiculopathy, lumbosacral region: Secondary | ICD-10-CM | POA: Diagnosis present

## 2023-06-03 DIAGNOSIS — M4317 Spondylolisthesis, lumbosacral region: Secondary | ICD-10-CM | POA: Insufficient documentation

## 2023-06-03 NOTE — Progress Notes (Signed)
PROVIDER NOTE: Information contained herein reflects review and annotations entered in association with encounter. Interpretation of such information and data should be left to medically-trained personnel. Information provided to patient can be located elsewhere in the medical record under "Patient Instructions". Document created using STT-dictation technology, any transcriptional errors that may result from process are unintentional.    Patient: Jacqueline Thornton  Service Category: E/M  Provider: Oswaldo Done, MD  DOB: 1951-06-30  DOS: 06/03/2023  Referring Provider: Margaretann Loveless, MD  MRN: [id number]  Specialty: Interventional Pain Management  PCP: Jacqueline Loveless, MD  Type: Established Patient  Setting: Ambulatory outpatient    Location: Office  Delivery: Face-to-face     HPI  [name], a 72 y.o. year old female, is here today because of her Chronic bilateral low back pain without sciatica [M54.50, G89.29]. Jacqueline Thornton's primary complain today is Back Pain (Right low)  Pertinent problems: [name] has Chronic pain syndrome; Headache, migraine; Chronic low back pain (Bilateral) (R>L) w/o sciatica; Spondylosis of lumbar spine; Lumbar annular disc tear (L4-5); Discogenic low back pain (L3-4 and L4-5); Lumbar facet hypertrophy; Lumbar facet syndrome (Bilateral) (R>L); Chronic neck pain (Left); Cervical spondylosis; Hx of cervical spine surgery; Cervical spinal fusion (C6-7 interbody fusion); Chronic sacroiliac joint pain (Bilateral) (L>R); Chronic lumbar radicular pain (Left); Trochanteric bursitis of hip (Right); Chronic hip pain (Bilateral) (L>R); Neurogenic pain; Myofascial pain; Osteoarthritis of hip (Left); Arthrodesis status; DDD (degenerative disc disease), lumbar; Myalgia; Trochanteric bursitis of hip (Bilateral) (L>R); Spondylosis without myelopathy or radiculopathy, lumbosacral region; Spondylosis without myelopathy or radiculopathy, sacral and sacrococcygeal region; Other  specified dorsopathies, sacral and sacrococcygeal region; Age-related osteoporosis without current pathological fracture; Bone island of right femur; Inflammatory spondylopathy of lumbar region [facility]); Trigger point with back pain (Right); Fibromyalgia; DDD (degenerative disc disease), cervical; Spondylolisthesis of lumbar region; History of lumbar spinal fusion (Dr. Maeola Harman) (12/11/2018); Failed back surgical syndrome; Unilateral occipital headache (Left); Cervicogenic headache (Left); Chronic hip pain (Left); Spondylolisthesis at L5-S1 level; Spondylosis of lumbosacral region without myelopathy or radiculopathy; Epidural fibrosis; Enthesopathy of hip region (Left); Chronic sacroiliac joint pain (Left); Enthesopathy of sacroiliac joint (Left); Somatic dysfunction of sacroiliac joint (Left); Herniated cervical disc without myelopathy; Chronic sacroiliac joint pain (Right); Osteoarthritis of sacroiliac joint (Right) (HCC); Somatic dysfunction of sacroiliac joint (Right); Chronic hip pain (Right); Neck pain; Herniated nucleus pulposus, C3-4; Prolapsed cervical intervertebral disc; Cervical spinal stenosis; Radiculopathy, cervical region; Lumbar central spinal stenosis, 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 Pain Assessment: Severity of Chronic pain is reported as a 5 /10. Location: Back Lower, Right/denies. Onset: More than a month ago. Quality: Aching, Dull. Timing: Constant. Modifying factor(s): meds. Vitals:  height is 5\' 4"  (1.626 m) and weight is 131 lb  (59.4 kg). Her temperature is 97.3 F (36.3 C) (abnormal). Her blood pressure is 150/72 (abnormal) and her pulse is 76. Her respiration is 16 and oxygen saturation is 97%.  BMI: Estimated body mass index is 22.49 kg/m as calculated from the following:   Height as of this encounter: 5\' 4"  (1.626 m).   Weight as of this encounter: 131 lb (59.4 kg). Last  encounter: 04/09/2023. Last procedure: 12/17/2022.  Reason for encounter: evaluation of worsening, or previously known (established) problem. Discussed the use of AI scribe software for clinical note transcription with the patient, who gave verbal consent to proceed.  History of Present Illness   The patient presents with chronic back pain, predominantly on the right side, but also across the entire back. The pain is severe enough to wake them up around 4:30 AM daily. They report a sensation of weakness in their legs while waiting for their pain medication to take effect, but deny any associated pain or numbness. The patient has a history of successful facet blocks and is considering repeating the procedure due to the recurrence of the same pain.  In addition to the back pain, the patient experiences muscle spasms in their back on both sides, particularly when walking. They have also noticed an aching sensation in both calves, which they describe as different from pain. The patient has a history of deep vein thrombosis (DVT) in the left leg, but is not currently on any blood thinners. They report minimal swelling in the leg where the DVT was located.  The patient also has a bone stimulator implanted due to two areas where bone had not filled in around a pedicle screw from a previous fusion. They have had the stimulator for about a month and will keep it for two more months. The patient feels that the stimulator might be exacerbating their pain. They also had a hip injection about five weeks ago, which provided temporary relief but the pain is returning.       Pharmacotherapy Assessment  Analgesic: Oxycodone/APAP 7.5/325, 1 tab PO q 5 times a day. (56.25 mg/day of oxycodone) MME/day: 56.25 mg/day.   Monitoring: [place] PMP: PDMP reviewed during this encounter.       Pharmacotherapy: No side-effects or adverse reactions reported. Compliance: No problems identified. Effectiveness: Clinically acceptable.  Valerie Salts, RN  06/03/2023  1:01 PM  Sign when Signing Visit Safety precautions to be maintained throughout the outpatient stay will include: orient to surroundings, keep bed in low position, maintain call bell within reach at all times, provide assistance with transfer out of bed and ambulation.    No results found for: "CBDTHCR" No results found for: "D8THCCBX" No results found for: "D9THCCBX"  UDS:  Summary  Date Value Ref Range Status  01/01/2023 Note  Final    Comment:    ==================================================================== [employer] 13 (MW) ==================================================================== Test                             Result       Flag       Units  Drug Present and Declared for Prescription Verification   Oxycodone                      7012         EXPECTED   ng/mg creat   Oxymorphone                    2293         EXPECTED   ng/mg creat   Noroxycodone                   9132         EXPECTED   ng/mg creat   Noroxymorphone  590          EXPECTED   ng/mg creat    Sources of oxycodone are scheduled prescription medications.    Oxymorphone, noroxycodone, and noroxymorphone are expected    metabolites of oxycodone. Oxymorphone is also available as a    scheduled prescription medication.  ==================================================================== Test                      Result    Flag   Units      Ref Range   Creatinine              41               mg/dL      >=45 ==================================================================== Declared Medications:   The flagging and interpretation on this report are based on the  following declared medications.  Unexpected results may arise from  inaccuracies in the declared medications.   **Note: The testing scope of this panel includes these medications:   Oxycodone (Percocet)   **Note: The testing scope of this panel does not include the  following reported medications:   Acetaminophen (Percocet)  Atorvastatin (Lipitor)  Calcium  Cyanocobalamin  Docusate (Colace)  Duloxetine (Cymbalta)  Enalapril (Vasotec)  Estradiol (Estrace)  Furosemide (Lasix)  Gabapentin (Neurontin)  Magnesium  Melatonin  Methocarbamol (Robaxin)  Multivitamin  Naloxone (Narcan)  Omeprazole (Prilosec)  Spironolactone (Aldactone)  Supplement  Vitamin D ==================================================================== For clinical consultation, please call [phone]. ====================================================================       ROS  Constitutional: Denies any fever or chills Gastrointestinal: No reported hemesis, hematochezia, vomiting, or acute GI distress Musculoskeletal: Denies any acute onset joint swelling, redness, loss of ROM, or weakness Neurological: No reported episodes of acute onset apraxia, aphasia, dysarthria, agnosia, amnesia, paralysis, loss of coordination, or loss of consciousness  Medication Review  Calcium-Vitamin D-Vitamin K, Cyanocobalamin, DULoxetine, Magnesium, Melatonin, atorvastatin, docusate sodium, enalapril, estradiol, furosemide, gabapentin, methocarbamol, multivitamin with minerals, naloxone, omeprazole, oxyCODONE-acetaminophen, and spironolactone  History Review  Allergy: Jacqueline Thornton has No Known Allergies. Drug: Jacqueline Thornton  reports no history of drug use. Alcohol:  reports no history of alcohol use. Tobacco:  reports that she quit smoking about 19 years ago. Her smoking use included cigarettes. She has never used smokeless tobacco. Social: Jacqueline Thornton   reports that she quit smoking about 19 years ago. Her smoking use included cigarettes. She has never used smokeless tobacco. She reports that she does not drink alcohol and does not use drugs. Medical:  has a past medical history of Absolute anemia (04/11/2015), Acute postoperative pain (08/07/2017), Angina pectoris (HCC), Anxiety, Arthritis, Arthropathy of sacroiliac joint (04/11/2015), Atypical face pain (04/24/2015), Back pain, CAD (coronary artery disease), Cancer (HCC), Chronic back pain, Constipation, Depression, Fibromyalgia, GERD (gastroesophageal reflux disease), 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) (04/11/2015), H/O: hysterectomy (1979), Heart murmur, Hyperlipidemia, Hypertension, Low back pain (04/06/2015), Lumbar radicular pain (04/18/2015), Migraine, Narrowing of intervertebral disc space (04/11/2015), Sacroiliac joint pain (04/11/2015), and Spine disorder. Surgical: Ms. Hock  has a past surgical history that includes Cholecystectomy; Abdominal hysterectomy; Neck surgery; Colonoscopy with propofol (N/A, 07/19/2015); caract surger; Laparoscopic salpingo oophorectomy (Bilateral, 03/03/2018); Cystoscopy (03/03/2018); Tonsillectomy; Appendectomy; Tumor removal; Anterior lumbar fusion (N/A, 12/11/2018); Abdominal exposure (N/A, 12/11/2018); Anterior cervical decomp/discectomy fusion (N/A, 10/06/2020); Toe Fusion (Right); Esophagogastroduodenoscopy (egd) with propofol (N/A, 09/11/2021); Colonoscopy with propofol (N/A, 09/11/2021); Colonoscopy with propofol (N/A, 03/05/2022); and Transforaminal lumbar interbody fusion w/ mis 1 level (Right, 04/30/2022). Family: family history includes Colon cancer in her maternal grandmother and  mother; Diabetes in her father and mother; Hypertension in her sister; Stroke in her mother.  Laboratory Chemistry Profile   Renal Lab Results  Component Value Date   BUN 12 01/13/2023   CREATININE 1.00 01/13/2023   BCR 12 01/13/2023   GFRAA 47 (L) 12/16/2019   GFRNONAA  >60 04/24/2022    Hepatic Lab Results  Component Value Date   AST 15 01/13/2023   ALT 11 01/13/2023   ALBUMIN 3.7 (L) 01/13/2023   ALKPHOS 52 01/13/2023    Electrolytes Lab Results  Component Value Date   NA 141 01/13/2023   K 4.4 01/13/2023   CL 103 01/13/2023   CALCIUM 9.1 01/13/2023   MG 2.4 (H) 12/16/2019    Bone Lab Results  Component Value Date   VD25OH 33.0 01/13/2023   25OHVITD1 53 12/16/2019   25OHVITD2 <1.0 12/16/2019   25OHVITD3 53 12/16/2019    Inflammation (CRP: Acute Phase) (ESR: Chronic Phase) Lab Results  Component Value Date   CRP 4 12/16/2019   ESRSEDRATE 6 12/16/2019         Note: Above Lab results reviewed.  Recent Imaging Review  CT LUMBAR SPINE WO CONTRAST CLINICAL DATA:  Worsening radiculopathy. Pain radiating down the right leg.  EXAM: CT LUMBAR SPINE WITHOUT CONTRAST  TECHNIQUE: Multidetector CT imaging of the lumbar spine was performed without intravenous contrast administration. Multiplanar CT image reconstructions were also generated.  RADIATION DOSE REDUCTION: This exam was performed according to the departmental dose-optimization program which includes automated exposure control, adjustment of the mA and/or kV according to patient size and/or use of iterative reconstruction technique.  COMPARISON:  08/22/2022  FINDINGS: Segmentation: 5 lumbar type vertebrae.  Alignment: Normal.  Vertebrae: No acute fracture or aggressive osseous lesion.  Paraspinal and other soft tissues: Negative.  Other: Abdominal aortic atherosclerosis.  Disc levels:  Disc spaces: Posterior lumbar interbody fusion at L4-5. Anterior lumbar interbody fusion at L5-S1. Solid osseous fusion across the L4-5 and L5-S1 disc spaces. Degenerative disease with disc height loss at L2-3 and L3-4.  T12-L1: No disc protrusion, foraminal stenosis or central canal stenosis.  L1-L2: No disc protrusion, foraminal stenosis or central canal stenosis.  L2-L3:  Broad-based disc bulge. Mild bilateral facet arthropathy. No foraminal or central canal stenosis.  L3-L4: Broad-based disc bulge. Mild bilateral facet arthropathy. No foraminal stenosis. No spinal stenosis.  L4-L5: Interbody fusion. Beam hardening artifact partially obscures the adjacent soft tissue and osseous structures. No definite foraminal stenosis.  L5-S1: Interbody fusion. No foraminal or central canal stenosis. Moderate-severe bilateral facet arthropathy.  IMPRESSION: 1. Posterior lumbar interbody fusion at L4-5. Anterior lumbar interbody fusion at L5-S1. Solid osseous fusion across the L4-5 and L5-S1 disc spaces. Moderate-severe bilateral facet arthropathy at L5-S1. 2. Lumbar spine spondylosis as described above. 3. No acute osseous injury of the lumbar spine. 4.  Aortic Atherosclerosis (ICD10-I70.0).  Electronically Signed   By: Elige Ko M.D.   On: 04/07/2023 08:46 Note: Reviewed        Physical Exam  General appearance: Well nourished, well developed, and well hydrated. In no apparent acute distress Mental status: Alert, oriented x 3 (person, place, & time)       Respiratory: No evidence of acute respiratory distress Eyes: PERLA Vitals: BP (!) 150/72   Pulse 76   Temp (!) 97.3 F (36.3 C)   Resp 16   Ht 5\' 4"  (1.626 m)   Wt 131 lb (59.4 kg)   SpO2 97%   BMI 22.49 kg/m  BMI: Estimated  body mass index is 22.49 kg/m as calculated from the following:   Height as of this encounter: 5\' 4"  (1.626 m).   Weight as of this encounter: 131 lb (59.4 kg). Ideal: Ideal body weight: 54.7 kg (120 lb 9.5 oz) Adjusted ideal body weight: 56.6 kg (124 lb 12.1 oz)  Today's physical exam shows the patient to be having increased pain in the lower back on hyperextension and rotation maneuver with the pain being ipsilateral to the side that he has been tested.  The exam is concordant with the [facility] maneuver bilaterally.  Even though she indicates her right side to be worse in  terms of her pain the physical exam shows the left side to be experiencing more pain with the provocative maneuver.  She denies any lower extremity pain, numbness, or weakness that would suggest any radicular component to this pain.  Assessment   Diagnosis Status  1. Chronic low back pain (Bilateral) (R>L) w/o sciatica   2. Lumbar facet joint pain   3. Lumbar facet hypertrophy   4. Lumbar facet syndrome (Bilateral) (R>L)   5. Spondylolisthesis at L5-S1 level   6. Spondylosis without myelopathy or radiculopathy, lumbosacral region   7. Degeneration of intervertebral disc of lumbar region with discogenic back pain   8. Abnormal MRI, lumbar spine & Sacrum (01/28/2022)    Controlled Controlled Controlled   Updated Problems: No problems updated.  Plan of Care  Problem-specific:  Assessment and Plan    Chronic Facet Joint Pain They experience chronic right facet joint pain with radiating back pain, bilateral muscle spasms, and leg weakness upon waking. Previous facet blocks provided relief. We discussed the risks of steroid injections, including potential swelling, and the benefits of pain relief and reduced muscle spasms. They prefer sedation during the procedure. We will schedule facet joint injections with sedation and ensure there are no active infections or new anticoagulants before the procedure.  Muscle Spasms Their bilateral back muscle spasms are likely exacerbated by increased physical activity. The anticipated facet joint injections should alleviate these symptoms.  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 They report increased pain potentially related to the stimulator. We discussed avoiding the stimulator during the facet joint injection procedure. They will continue using the bone stimulator for two more months but avoid it during the facet joint injection  procedure.  Deep Vein Thrombosis (DVT) They have DVT in the left leg, currently not on anticoagulants, with minimal swelling present. We discussed the potential benefit of baby aspirin to prevent future clots and advised consulting with their primary care physician about taking baby aspirin every other day.  Follow-up We will schedule a follow-up appointment post-facet joint injections to assess pain relief and muscle spasm improvement.      Jacqueline Thornton has a current medication list which includes the following long-term medication(s): atorvastatin, viactiv calcium plus d, cyanocobalamin, duloxetine, enalapril, estradiol, furosemide, gabapentin, oxycodone-acetaminophen, [START ON 06/09/2023] oxycodone-acetaminophen, spironolactone, omeprazole, and oxycodone-acetaminophen.  Pharmacotherapy (Medications Ordered): No orders of the defined types were placed in this encounter.  Orders:  Orders Placed This Encounter  Procedures   LUMBAR FACET(MEDIAL BRANCH NERVE BLOCK) MBNB    Diagnosis: Lumbar Facet Syndrome (M47.816); Lumbosacral Facet Syndrome (M47.817); Lumbar Facet Joint Pain (M54.59) Medical Necessity Statement: 1.Severe chronic axial low back pain causing functional impairment documented by ongoing pain scale assessments. 2.Pain present for longer than 3 months (Chronic) documented to  have failed noninvasive conservative therapies. 3.Absence of untreated radiculopathy. 4.There is no radiological evidence of untreated fractures, tumor, infection, or deformity.  Physical Examination Findings: Positive Kemp Maneuver: (Y)  Positive Lumbar Hyperextension-Rotation provocative test: (Y)    Standing Status:   Future    Standing Expiration Date:   09/01/2023    Scheduling Instructions:     Procedure: Lumbar facet Block     Type: Medial Branch Block     Side: Bilateral     Purpose: Diagnostic Radiologic Mapping     Level(s): L3-4, L4-5, L5-S1, and TBD by Fluoroscopic Mapping Facets  (L2, L3, L4, L5, S1, and TBD Medial Branch)     Sedation: With Sedation.     Timeframe: ASAA    Order Specific Question:   Where will this procedure be performed?    Answer:   [facility]   Follow-up plan:   Return for (ECT): (B) L-FCT Blk.      Interventional Therapies  Risk Factors  Considerations:     Lumbar HARDWARE (NO RFA)     Planned  Pending:   Diagnostic bilateral lumbar MBB (pedicle screw hardware inj.)  #1 (12/17/2022)  Diagnostic/therapeutic midline caudal ESI #2 + sacral interspinous ligament MNB/TPI #1    Under consideration:   Diagnostic lumbar SCS trial    Completed:   Diagnostic bilateral lumbar MBB (pedicle screw hardware inj.) x1 (12/17/2022)  Diagnostic bilateral L4 TFESI x1 (01/08/2022) (100/25/0)  Therapeutic right Racz procedure x1 (08/17/2020) (04/09/84/85) (100% relief of the right LEP) Diagnostic caudal ESI x1 (07/11/2020) (100/100/0/0)  Palliative left L5-S1 LESI x2 (07/27/2015) (70/70/0/0)  Diagnostic left lumbar facet MBB x7 (02/07/2022) (20/20/0/0)  Palliative right lumbar facet MBB x10 (02/07/2022) (20/20/0/0)  Therapeutic right shoulder joint inj. x1 (02/29/2016) (90/80/30)  Therapeutic left IA hip injection x3 (06/13/2020) (100/100/30/>50)  Diagnostic right SI joint Blk x3 (06/17/2017) (100/90/80/80)  Diagnostic/therapeutic left SI joint Blk x4 (06/17/2017) (100/90/80/80)  Palliative bilateral trochanteric bursa inj. x1 (12/11/2016) (100/100/90/90)  Therapeutic right lumbar facet RFA x2 (01/11/2021) (100/100/70)  Therapeutic left lumbar facet RFA x2 (12/19/2020) (100/100/75/>75) Therapeutic right SI joint RFA x1 (08/07/2017) (100/100/80)  Therapeutic left SI joint RFA x1 (09/16/2017) (100/100/100)    Therapeutic  Palliative (PRN) options:   Palliative Racz procedure  Palliative bilateral SI joint block  Palliative right trochanteric bursa injection    Pharmacotherapy  Nonopioids transferred 05/24/2020: Vitamin B12, Flexeril, and  Neurontin.           Recent Visits Date Type Provider Dept  04/09/23 Office Visit Delano Metz, MD [facility]  Showing recent visits within past 90 days and meeting all other requirements Today's Visits Date Type Provider Dept  06/03/23 Office Visit Delano Metz, MD [facility]  Showing today's visits and meeting all other requirements Future Appointments Date Type Provider Dept  07/07/23 Appointment Delano Metz, MD [facility]  Showing future appointments within next 90 days and meeting all other requirements  I discussed the assessment and treatment plan with the patient. The patient was provided an opportunity to ask questions and all were answered. The patient agreed with the plan and demonstrated an understanding of the instructions.  Patient advised to call back or seek an in-person evaluation if the symptoms or condition worsens.  Duration of encounter: 30 minutes.  Total time on encounter, as per AMA guidelines included both the face-to-face and non-face-to-face time personally spent by the physician and/or other qualified health care professional(s) on the day of the encounter (includes time in  activities that require the physician or other qualified health care professional and does not include time in activities normally performed by clinical staff). Physician's time may include the following activities when performed: Preparing to see the patient (e.g., pre-charting review of records, searching for previously ordered imaging, lab work, and nerve conduction tests) Review of prior analgesic pharmacotherapies. Reviewing PMP Interpreting ordered tests (e.g., lab work, imaging, nerve conduction tests) Performing post-procedure evaluations, including interpretation of diagnostic procedures Obtaining and/or reviewing separately obtained history Performing a medically appropriate examination and/or evaluation Counseling and  educating the patient/family/caregiver Ordering medications, tests, or procedures Referring and communicating with other health care professionals (when not separately reported) Documenting clinical information in the electronic or other health record Independently interpreting results (not separately reported) and communicating results to the patient/ family/caregiver Care coordination (not separately reported)  Note by: Oswaldo Done, MD Date: 06/03/2023; Time: 1:44 PM

## 2023-06-03 NOTE — Patient Instructions (Signed)

## 2023-06-03 NOTE — Progress Notes (Signed)
Safety precautions to be maintained throughout the outpatient stay will include: orient to surroundings, keep bed in low position, maintain call bell within reach at all times, provide assistance with transfer out of bed and ambulation.  

## 2023-06-18 NOTE — Progress Notes (Unsigned)
PROVIDER NOTE: Interpretation of information contained herein should be left to medically-trained personnel. Specific patient instructions are provided elsewhere under "Patient Instructions" section of medical record. This document was created in part using STT-dictation technology, any transcriptional errors that may result from this process are unintentional.  Patient: Jacqueline Thornton Type: Established DOB: April 21, 1951 MRN: [id number] PCP: Jacqueline Loveless, MD  Service: Procedure DOS: 06/19/2023 Setting: Ambulatory Location: Ambulatory outpatient facility Delivery: Face-to-face Provider: Oswaldo Done, MD Specialty: Interventional Pain Management Specialty designation: 09 Location: Outpatient facility Ref. Prov.: Jacqueline Metz, MD       Interventional Therapy   Type: Lumbar Facet, Medial Branch Block(s) (w/ fluoroscopic mapping)  R11L8 (bilateral lumbar pedicle screw hardware injection Thornton 12/17/2022) Laterality: Bilateral  Level: L2, L3, L4, L5, and S1 Medial Branch Level(s). Injecting these levels blocks the L3-4, L4-5, and L5-S1 lumbar facet joints. Imaging: Fluoroscopic guidance         Anesthesia: Local anesthesia (1-2% Lidocaine) Anxiolysis: IV Versed         Sedation:                         DOS: 06/19/2023 Performed by: Jacqueline Done, MD  Primary Purpose: Diagnostic/Therapeutic Indications: Low back pain severe enough to impact quality of life or function. 1. Chronic low back pain (Bilateral) (R>L) w/o sciatica   2. Lumbar facet joint pain   3. Lumbar facet hypertrophy   4. Lumbar facet syndrome (Bilateral) (R>L)   5. Spondylosis without myelopathy or radiculopathy, lumbosacral region   6. History of lumbar spinal fusion (Dr. Maeola Thornton) (12/11/2018)   7. Failed back surgical syndrome    NAS-11 Pain score:   Pre-procedure:  /10   Post-procedure:  /10     Position / Prep / Materials:  Position: Prone  Prep solution: ChloraPrep (2% chlorhexidine  gluconate and 70% isopropyl alcohol) Area Prepped: Posterolateral Lumbosacral Spine (Wide prep: From the lower border of the scapula down to the end of the tailbone and from flank to flank.)  Materials:  Tray: Block Needle(s):  Type: Spinal  Gauge (G): 22  Length: 3.5-in Qty: 4      H&P (Pre-op Assessment):  Jacqueline Thornton is a 72 y.o. (year old), female patient, seen today for interventional treatment. She  has a past surgical history that includes Cholecystectomy; Abdominal hysterectomy; Neck surgery; Colonoscopy with propofol (N/A, 07/19/2015); caract surger; Laparoscopic salpingo oophorectomy (Bilateral, 03/03/2018); Cystoscopy (03/03/2018); Tonsillectomy; Appendectomy; Tumor removal; Anterior lumbar fusion (N/A, 12/11/2018); Abdominal exposure (N/A, 12/11/2018); Anterior cervical decomp/discectomy fusion (N/A, 10/06/2020); Toe Fusion (Right); Esophagogastroduodenoscopy (egd) with propofol (N/A, 09/11/2021); Colonoscopy with propofol (N/A, 09/11/2021); Colonoscopy with propofol (N/A, 03/05/2022); and Transforaminal lumbar interbody fusion w/ mis 1 level (Right, 04/30/2022). Jacqueline Thornton has a current medication list which includes the following prescription(s): atorvastatin, viactiv calcium plus d, cyanocobalamin, docusate sodium, duloxetine, enalapril, estradiol, furosemide, gabapentin, magnesium, melatonin, methocarbamol, multivitamin with minerals, naloxone, omeprazole, oxycodone-acetaminophen, oxycodone-acetaminophen, oxycodone-acetaminophen, and spironolactone. Her primarily concern today is the No chief complaint on file.  Initial Vital Signs:  Pulse/HCG Rate:    Temp:   Resp:   BP:   SpO2:    BMI: Estimated body mass index is 22.49 kg/m as calculated from the following:   Height as of 06/03/23: 5\' 4"  (1.626 m).   Weight as of 06/03/23: 131 lb (59.4 kg).  Risk Assessment: Allergies: Reviewed. She has no known allergies.  Allergy Precautions: None required Coagulopathies: Reviewed.  None identified.  Blood-thinner therapy: None at  this time Active Infection(s): Reviewed. None identified. Jacqueline Thornton is afebrile  Site Confirmation: Jacqueline Thornton was asked to confirm the procedure and laterality before marking the site Procedure checklist: Completed Consent: Before the procedure and under the influence of no sedative(s), amnesic(s), or anxiolytics, the patient was informed of the treatment options, risks and possible complications. To fulfill our ethical and legal obligations, as recommended by the American Medical Association's Code of Ethics, I have informed the patient of my clinical impression; the nature and purpose of the treatment or procedure; the risks, benefits, and possible complications of the intervention; the alternatives, including doing nothing; the risk(s) and benefit(s) of the alternative treatment(s) or procedure(s); and the risk(s) and benefit(s) of doing nothing. The patient was provided information about the general risks and possible complications associated with the procedure. These may include, but are not limited to: failure to achieve desired goals, infection, bleeding, organ or nerve damage, allergic reactions, paralysis, and death. In addition, the patient was informed of those risks and complications associated to Spine-related procedures, such as failure to decrease pain; infection (i.e.: Meningitis, epidural or intraspinal abscess); bleeding (i.e.: epidural hematoma, subarachnoid hemorrhage, or any other type of intraspinal or peri-dural bleeding); organ or nerve damage (i.e.: Any type of peripheral nerve, nerve root, or spinal cord injury) with subsequent damage to sensory, motor, and/or autonomic systems, resulting in permanent pain, numbness, and/or weakness of one or several areas of the body; allergic reactions; (i.e.: anaphylactic reaction); and/or death. Furthermore, the patient was informed of those risks and complications associated with the  medications. These include, but are not limited to: allergic reactions (i.e.: anaphylactic or anaphylactoid reaction(s)); adrenal axis suppression; blood sugar elevation that in diabetics may result in ketoacidosis or comma; water retention that in patients with history of congestive heart failure may result in shortness of breath, pulmonary edema, and decompensation with resultant heart failure; weight gain; swelling or edema; medication-induced neural toxicity; particulate matter embolism and blood vessel occlusion with resultant organ, and/or nervous system infarction; and/or aseptic necrosis of one or more joints. Finally, the patient was informed that Medicine is not an exact science; therefore, there is also the possibility of unforeseen or unpredictable risks and/or possible complications that may result in a catastrophic outcome. The patient indicated having understood very clearly. We have given the patient no guarantees and we have made no promises. Enough time was given to the patient to ask questions, all of which were answered to the patient's satisfaction. Ms. Laudon has indicated that she wanted to continue with the procedure. Attestation: I, the ordering provider, attest that I have discussed with the patient the benefits, risks, side-effects, alternatives, likelihood of achieving goals, and potential problems during recovery for the procedure that I have provided informed consent. Date  Time: {CHL [facility]IN TIME CHOICES:[id number]}   Pre-Procedure Preparation:  Monitoring: As per clinic protocol. Respiration, ETCO2, SpO2, BP, heart rate and rhythm monitor placed and checked for adequate function Safety Precautions: Patient was assessed for positional comfort and pressure points before starting the procedure. Time-out: I initiated and conducted the "Time-out" before starting the procedure, as per protocol. The patient was asked to participate by confirming the accuracy of the "Time Out"  information. Verification of the correct person, site, and procedure were performed and confirmed by me, the nursing staff, and the patient. "Time-out" conducted as per Joint Commission's Universal Protocol (UP.01.01.01). Time:   Start Time:   hrs.  Description of Procedure:  Laterality: (see above) Targeted Levels: (see above)  Safety Precautions: Aspiration looking for blood return was conducted prior to all injections. At no point did we inject any substances, as a needle was being advanced. Before injecting, the patient was told to immediately notify me if she was experiencing any new onset of "ringing in the ears, or metallic taste in the mouth". No attempts were made at seeking any paresthesias. Safe injection practices and needle disposal techniques used. Medications properly checked for expiration dates. SDV (single dose vial) medications used. After the completion of the procedure, all disposable equipment used was discarded in the proper designated medical waste containers. Local Anesthesia: Protocol guidelines were followed. The patient was positioned over the fluoroscopy table. The area was prepped in the usual manner. The time-out was completed. The target area was identified using fluoroscopy. A 12-in long, straight, sterile hemostat was used with fluoroscopic guidance to locate the targets for each level blocked. Once located, the skin was marked with an approved surgical skin marker. Once all sites were marked, the skin (epidermis, dermis, and hypodermis), as well as deeper tissues (fat, connective tissue and muscle) were infiltrated with a small amount of a short-acting local anesthetic, loaded on a 10cc syringe with a 25G, 1.5-in  Needle. An appropriate amount of time was allowed for local anesthetics to take effect before proceeding to the next step. Local Anesthetic: Lidocaine 2.0% The unused portion of the local anesthetic was discarded in the proper designated  containers. Technical description of process:  L2 Medial Branch Nerve Block (MBB): The target area for the L2 medial branch is at the junction of the postero-lateral aspect of the superior articular process and the superior, posterior, and medial edge of the transverse process of L3. Under fluoroscopic guidance, a Quincke needle was inserted until contact was made with os over the superior postero-lateral aspect of the pedicular shadow (target area). After negative aspiration for blood, 0.5 mL of the nerve block solution was injected without difficulty or complication. The needle was removed intact. L3 Medial Branch Nerve Block (MBB): The target area for the L3 medial branch is at the junction of the postero-lateral aspect of the superior articular process and the superior, posterior, and medial edge of the transverse process of L4. Under fluoroscopic guidance, a Quincke needle was inserted until contact was made with os over the superior postero-lateral aspect of the pedicular shadow (target area). After negative aspiration for blood, 0.5 mL of the nerve block solution was injected without difficulty or complication. The needle was removed intact. L4 Medial Branch Nerve Block (MBB): The target area for the L4 medial branch is at the junction of the postero-lateral aspect of the superior articular process and the superior, posterior, and medial edge of the transverse process of L5. Under fluoroscopic guidance, a Quincke needle was inserted until contact was made with os over the superior postero-lateral aspect of the pedicular shadow (target area). After negative aspiration for blood, 0.5 mL of the nerve block solution was injected without difficulty or complication. The needle was removed intact. L5 Medial Branch Nerve Block (MBB): The target area for the L5 medial branch is at the junction of the postero-lateral aspect of the superior articular process and the superior, posterior, and medial edge of the  sacral ala. Under fluoroscopic guidance, a Quincke needle was inserted until contact was made with os over the superior postero-lateral aspect of the pedicular shadow (target area). After negative aspiration for blood, 0.5 mL of the nerve block solution  was injected without difficulty or complication. The needle was removed intact. S1 Medial Branch Nerve Block (MBB): The target area for the S1 medial branch is at the posterior and inferior 6 o'clock position of the L5-S1 facet joint. Under fluoroscopic guidance, the Quincke needle inserted for the L5 MBB was redirected until contact was made with os over the inferior and postero aspect of the sacrum, at the 6 o' clock position under the L5-S1 facet joint (Target area). After negative aspiration for blood, 0.5 mL of the nerve block solution was injected without difficulty or complication. The needle was removed intact.  Once the entire procedure was completed, the treated area was cleaned, making sure to leave some of the prepping solution back to take advantage of its long term bactericidal properties.         Illustration of the posterior view of the lumbar spine and the posterior neural structures. Laminae of L2 through S1 are labeled. DPRL5, dorsal primary ramus of L5; DPRS1, dorsal primary ramus of S1; DPR3, dorsal primary ramus of L3; FJ, facet (zygapophyseal) joint L3-L4; I, inferior articular process of L4; LB1, lateral branch of dorsal primary ramus of L1; IAB, inferior articular branches from L3 medial branch (supplies L4-L5 facet joint); IBP, intermediate branch plexus; MB3, medial branch of dorsal primary ramus of L3; NR3, third lumbar nerve root; S, superior articular process of L5; SAB, superior articular branches from L4 (supplies L4-5 facet joint also); TP3, transverse process of L3.   Facet Joint Innervation (* possible contribution)  L1-2 T12, L1 (L2*)  Medial Branch  L2-3 L1, L2 (L3*)         "          "  L3-4 L2, L3 (L4*)         "           "  L4-5 L3, L4 (L5*)         "          "  L5-S1 L4, L5, S1          "          "    There were no vitals filed for this visit.   End Time:   hrs.  Imaging Guidance (Spinal):          Type of Imaging Technique: Fluoroscopy Guidance (Spinal) Indication(s): Fluoroscopy guidance for needle placement to enhance accuracy in procedures requiring precise needle localization for targeted delivery of medication in or near specific anatomical locations not easily accessible without such real-time imaging assistance. Exposure Time: Please see nurses notes. Contrast: None used. Fluoroscopic Guidance: I was personally present during the use of fluoroscopy. "Tunnel Vision Technique" used to obtain the best possible view of the target area. Parallax error corrected before commencing the procedure. "Direction-depth-direction" technique used to introduce the needle under continuous pulsed fluoroscopy. Once target was reached, antero-posterior, oblique, and lateral fluoroscopic projection used confirm needle placement in all planes. Images permanently stored in EMR. Interpretation: No contrast injected. I personally interpreted the imaging intraoperatively. Adequate needle placement confirmed in multiple planes. Permanent images saved into the patient's record.  Post-operative Assessment:  Post-procedure Vital Signs:  Pulse/HCG Rate:    Temp:   Resp:   BP:   SpO2:    EBL: None  Complications: No immediate post-treatment complications observed by team, or reported by patient.  Note: The patient tolerated the entire procedure well. A repeat set of vitals were taken after the procedure and the patient  was kept under observation following institutional policy, for this type of procedure. Post-procedural neurological assessment was performed, showing return to baseline, prior to discharge. The patient was provided with post-procedure discharge instructions, including a section on how to identify  potential problems. Should any problems arise concerning this procedure, the patient was given instructions to immediately contact us, at any time, without hesitation. In any case, we plan to contact the patient by telephone for a follow-up status report regarding this interventional procedure.  Comments:  No additional relevant information.  Plan of Care (POC)  Orders:  No orders of the defined types were placed in this encounter.  Chronic Opioid Analgesic:   Oxycodone/APAP 7.5/325, 1 tab PO q 5 times a day. (56.25 mg/day of oxycodone) MME/day: 56.25 mg/day.   Medications ordered for procedure: No orders of the defined types were placed in this encounter.  Medications administered: Dajai L. Pitkin had no medications administered during this visit.  See the medical record for exact dosing, route, and time of administration.  Follow-up plan:   No follow-ups on file.       Interventional Therapies  Risk Factors  Considerations:     Lumbar HARDWARE (NO RFA)     Planned  Pending:   Diagnostic bilateral lumbar MBB (pedicle screw hardware inj.)  #2  Diagnostic/therapeutic midline caudal ESI #2 + sacral interspinous ligament MNB/TPI #1    Under consideration:   Diagnostic lumbar SCS trial    Completed:   Diagnostic bilateral lumbar MBB (pedicle screw hardware inj.) x1 (Q65H8) (12/17/2022) (100/100/90/R:90L:40) Diagnostic bilateral L4 TFESI x1 (01/08/2022) (100/25/0)  Therapeutic right Racz procedure x1 (08/17/2020) (04/09/84/85) (100% relief of the right LEP) Diagnostic caudal ESI x1 (07/11/2020) (100/100/0/0)  Palliative left L5-S1 LESI x2 (07/27/2015) (70/70/0/0)  Diagnostic left lumbar facet MBB x7 (02/07/2022) (20/20/0/0)  Palliative right lumbar facet MBB x10 (02/07/2022) (20/20/0/0)  Therapeutic right shoulder joint inj. x1 (02/29/2016) (90/80/30)  Therapeutic left IA hip injection x3 (06/13/2020) (100/100/30/>50)  Diagnostic right SI joint Blk x3 (06/17/2017)  (100/90/80/80)  Diagnostic/therapeutic left SI joint Blk x4 (06/17/2017) (100/90/80/80)  Palliative bilateral trochanteric bursa inj. x1 (12/11/2016) (100/100/90/90)  Therapeutic right lumbar facet RFA x2 (01/11/2021) (100/100/70)  Therapeutic left lumbar facet RFA x2 (12/19/2020) (100/100/75/>75) Therapeutic right SI joint RFA x1 (08/07/2017) (100/100/80)  Therapeutic left SI joint RFA x1 (09/16/2017) (100/100/100)    Therapeutic  Palliative (PRN) options:   Palliative Racz procedure  Palliative bilateral SI joint block  Palliative right trochanteric bursa injection    Pharmacotherapy  Nonopioids transferred 05/24/2020: Vitamin B12, Flexeril, and Neurontin.           Recent Visits Date Type Provider Dept  06/03/23 Office Visit Jacqueline Metz, MD [facility]  04/09/23 Office Visit Jacqueline Metz, MD [facility]  Showing recent visits within past 90 days and meeting all other requirements Future Appointments Date Type Provider Dept  06/19/23 Appointment Jacqueline Metz, MD [facility]  07/09/23 Appointment Jacqueline Metz, MD [facility]  Showing future appointments within next 90 days and meeting all other requirements  Disposition: Discharge home  Discharge (Date  Time): 06/19/2023;   hrs.   Primary Care Physician: Jacqueline Loveless, MD Location: [facility] Note by: Jacqueline Done, MD (TTS technology used. I apologize for any typographical errors that were not detected and corrected.) Date: 06/19/2023; Time: 12:31 PM  Disclaimer:  Medicine is not an Visual merchandiser. The only guarantee in medicine is that nothing is guaranteed. It is important to  note that the decision to proceed with this intervention was based on the information collected from the patient. The Data and conclusions were drawn from the patient's questionnaire, the interview, and the physical examination. Because the information  was provided in large part by the patient, it cannot be guaranteed that it has not been purposely or unconsciously manipulated. Every effort has been made to obtain as much relevant data as possible for this evaluation. It is important to note that the conclusions that lead to this procedure are derived in large part from the available data. Always take into account that the treatment will also be dependent on availability of resources and existing treatment guidelines, considered by other Pain Management Practitioners as being common knowledge and practice, at the time of the intervention. For Medico-Legal purposes, it is also important to point out that variation in procedural techniques and pharmacological choices are the acceptable norm. The indications, contraindications, technique, and results of the above procedure should only be interpreted and judged by a Board-Certified Interventional Pain Specialist with extensive familiarity and expertise in the same exact procedure and technique.

## 2023-06-19 ENCOUNTER — Ambulatory Visit: Payer: Medicare Other | Attending: Pain Medicine | Admitting: Pain Medicine

## 2023-06-19 DIAGNOSIS — Z981 Arthrodesis status: Secondary | ICD-10-CM

## 2023-06-19 DIAGNOSIS — M47816 Spondylosis without myelopathy or radiculopathy, lumbar region: Secondary | ICD-10-CM

## 2023-06-19 DIAGNOSIS — M5459 Other low back pain: Secondary | ICD-10-CM

## 2023-06-19 DIAGNOSIS — Z5189 Encounter for other specified aftercare: Secondary | ICD-10-CM

## 2023-06-19 DIAGNOSIS — M47817 Spondylosis without myelopathy or radiculopathy, lumbosacral region: Secondary | ICD-10-CM

## 2023-06-19 DIAGNOSIS — M545 Low back pain, unspecified: Secondary | ICD-10-CM

## 2023-06-19 DIAGNOSIS — M961 Postlaminectomy syndrome, not elsewhere classified: Secondary | ICD-10-CM

## 2023-06-19 DIAGNOSIS — Z91199 Patient's noncompliance with other medical treatment and regimen due to unspecified reason: Secondary | ICD-10-CM

## 2023-06-29 ENCOUNTER — Other Ambulatory Visit: Payer: Self-pay | Admitting: Internal Medicine

## 2023-07-03 ENCOUNTER — Other Ambulatory Visit: Payer: Self-pay | Admitting: Internal Medicine

## 2023-07-07 ENCOUNTER — Encounter: Payer: Medicare Other | Admitting: Pain Medicine

## 2023-07-08 NOTE — Patient Instructions (Signed)

## 2023-07-08 NOTE — Progress Notes (Signed)
 PROVIDER NOTE: Information contained herein reflects review and annotations entered in association with encounter. Interpretation of such information and data should be left to medically-trained personnel. Information provided to patient can be located elsewhere in the medical record under Patient Instructions. Document created using STT-dictation technology, any transcriptional errors that may result from process are unintentional.    Patient: Jacqueline Thornton  Service Category: E/M  Provider: Eric DELENA Como, MD  DOB: [birth year]  DOS: 07/09/2023  Referring Provider: Fernand Fredy RAMAN, MD  MRN: [id number]  Specialty: Interventional Pain Management  PCP: Fernand Fredy RAMAN, MD  Type: Established Patient  Setting: Ambulatory outpatient    Location: Office  Delivery: Face-to-face     HPI  [name], a 73 y.o. year old female, is here today because of her Chronic pain syndrome [G89.4]. Ms. Glendening's primary complain today is Back Pain  Pertinent problems: Ms. Kobrin has Chronic pain syndrome; Headache, migraine; Chronic low back pain (Bilateral) (R>L) w/o sciatica; Spondylosis of lumbar spine; Lumbar annular disc tear (L4-5); Discogenic low back pain (L3-4 and L4-5); Lumbar facet hypertrophy; Lumbar facet syndrome (Bilateral) (R>L); Chronic neck pain (Left); Cervical spondylosis; Hx of cervical spine surgery; Cervical spinal fusion (C6-7 interbody fusion); Chronic sacroiliac joint pain (Bilateral) (L>R); Chronic lumbar radicular pain (Left); Trochanteric bursitis of hip (Right); Chronic hip pain (Bilateral) (L>R); Neurogenic pain; Myofascial pain; Osteoarthritis of hip (Left); Arthrodesis status; DDD (degenerative disc disease), lumbar; Myalgia; Trochanteric bursitis of hip (Bilateral) (L>R); Spondylosis without myelopathy or radiculopathy, lumbosacral region; Spondylosis without myelopathy or radiculopathy, sacral and sacrococcygeal region; Other specified dorsopathies, sacral and sacrococcygeal  region; Age-related osteoporosis without current pathological fracture; Bone island of right femur; Inflammatory spondylopathy of lumbar region [facility]); Trigger point with back pain (Right); Fibromyalgia; DDD (degenerative disc disease), cervical; Spondylolisthesis of lumbar region; History of lumbar spinal fusion (Dr. Fairy Levels) (12/11/2018); Failed back surgical syndrome; Unilateral occipital headache (Left); Cervicogenic headache (Left); Chronic hip pain (Left); Spondylolisthesis at L5-S1 level; Spondylosis of lumbosacral region without myelopathy or radiculopathy; Epidural fibrosis; Enthesopathy of hip region (Left); Chronic sacroiliac joint pain (Left); Enthesopathy of sacroiliac joint (Left); Somatic dysfunction of sacroiliac joint (Left); Herniated cervical disc without myelopathy; Chronic sacroiliac joint pain (Right); Osteoarthritis of sacroiliac joint (Right) (HCC); Somatic dysfunction of sacroiliac joint (Right); Chronic hip pain (Right); Neck pain; Herniated nucleus pulposus, C3-4; Prolapsed cervical intervertebral disc; Cervical spinal stenosis; Radiculopathy, cervical region; Lumbar central spinal stenosis, 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 Pain Assessment: Severity of Chronic pain is reported as a 7 /10. Location: Back Lower, Right, Left/Denies. Onset: More than a month ago. Quality: Constant, Aching. Timing: Constant. Modifying factor(s): Medication. Vitals:  height is 5' (1.524 m) and weight is 131 lb (59.4 kg). Her temporal temperature  is 96.8 F (36 C) (abnormal). Her blood pressure is 163/76 (abnormal) and her pulse is 84. Her respiration is 16 and oxygen saturation is 99%.  BMI: Estimated body mass index is 25.58 kg/m as calculated from the following:   Height as of this encounter: 5' (1.524 m).   Weight as of this encounter: 131 lb (59.4 kg). Last encounter: 06/03/2023. Last procedure: 06/19/2023.  Reason  for encounter: medication management.  The patient indicates doing well with the current medication regimen. No adverse reactions or side effects reported to the medications.   Discussed the use of AI scribe software for clinical note transcription with the patient, who gave verbal consent to proceed.  History of Present Illness   The patient, with a history of chronic pain, presented for a routine follow-up visit. She reported no adverse reactions or side effects to her current pain medication regimen. The patient's primary complaint was persistent pain in the lower back, specifically on the right facet and across the sacrum area. The pain was noted to be lower than usual. Despite the ongoing pain, the patient did not request any immediate interventions, as she was already scheduled for an injection procedure on the 14th. The patient's urine drug screening test and prescription monitoring program were up-to-date and satisfactory. The patient did not report any new symptoms or concerns during this visit.     RTCB: 08/08/2023 (patient forgot to bring pills to be counted)  Pharmacotherapy Assessment  Analgesic: Oxycodone /APAP 7.5/325, 1 tab PO q 5 times a day. (56.25 mg/day of oxycodone ) MME/day: 56.25 mg/day.   Monitoring: [place] PMP: PDMP reviewed during this encounter.       Pharmacotherapy: No side-effects or adverse reactions reported. Compliance: No problems identified. Effectiveness: Clinically acceptable.  Bonner Norris, RN  07/09/2023 11:31 AM  Sign when Signing Visit Nursing Pain Medication Assessment:  Safety  precautions to be maintained throughout the outpatient stay will include: orient to surroundings, keep bed in low position, maintain call bell within reach at all times, provide assistance with transfer out of bed and ambulation.  Medication Inspection Compliance: Ms. Sebek did not comply with our request to bring her pills to be counted. She was reminded that bringing the medication bottles, even when empty, is a requirement.  Medication: None brought in. Pill/Patch Count: None available to be counted. Bottle Appearance: No container available. Did not bring bottle(s) to appointment. Filled Date: N/A Last Medication intake:  Yesterday    No results found for: CBDTHCR No results found for: D8THCCBX No results found for: D9THCCBX  UDS:  Summary  Date Value Ref Range Status  01/01/2023 Note  Final    Comment:    ==================================================================== [employer] (MW) ==================================================================== Test                             Result       Flag       Units  Drug Present and Declared for Prescription Verification   Oxycodone                       7012         EXPECTED   ng/mg creat   Oxymorphone                    2293         EXPECTED   ng/mg creat   Noroxycodone                   9132         EXPECTED   ng/mg creat   Noroxymorphone                 590          EXPECTED   ng/mg creat    Sources of oxycodone  are scheduled prescription medications.  Oxymorphone, noroxycodone, and noroxymorphone are expected    metabolites of oxycodone . Oxymorphone is also available as a    scheduled prescription medication.  ==================================================================== Test                      Result    Flag   Units      Ref Range   Creatinine              41               mg/dL      >=79 ==================================================================== Declared Medications:  The  flagging and interpretation on this report are based on the  following declared medications.  Unexpected results may arise from  inaccuracies in the declared medications.   **Note: The testing scope of this panel includes these medications:   Oxycodone  (Percocet)   **Note: The testing scope of this panel does not include the  following reported medications:   Acetaminophen  (Percocet)  Atorvastatin  (Lipitor)  Calcium   Cyanocobalamin   Docusate (Colace)  Duloxetine  (Cymbalta )  Enalapril  (Vasotec )  Estradiol  (Estrace )  Furosemide  (Lasix )  Gabapentin  (Neurontin )  Magnesium  Melatonin  Methocarbamol  (Robaxin )  Multivitamin  Naloxone  (Narcan )  Omeprazole (Prilosec)  Spironolactone  (Aldactone )  Supplement  Vitamin D  ==================================================================== For clinical consultation, please call [phone]. ====================================================================       ROS  Constitutional: Denies any fever or chills Gastrointestinal: No reported hemesis, hematochezia, vomiting, or acute GI distress Musculoskeletal: Denies any acute onset joint swelling, redness, loss of ROM, or weakness Neurological: No reported episodes of acute onset apraxia, aphasia, dysarthria, agnosia, amnesia, paralysis, loss of coordination, or loss of consciousness  Medication Review  Calcium -Vitamin D -Vitamin K, Cyanocobalamin , DULoxetine , Magnesium, Melatonin, atorvastatin , docusate sodium , enalapril , estradiol , furosemide , gabapentin , methocarbamol , multivitamin with minerals, naloxone , omeprazole, oxyCODONE -acetaminophen , and spironolactone   History Review  Allergy: Ms. Mcquade has no known allergies. Drug: Ms. Gaertner  reports no history of drug use. Alcohol:  reports no history of alcohol use. Tobacco:  reports that she quit smoking about 20 years ago. Her smoking use included cigarettes. She has never used smokeless tobacco. Social: Ms. Wurzel   reports that she quit smoking about 20 years ago. Her smoking use included cigarettes. She has never used smokeless tobacco. She reports that she does not drink alcohol and does not use drugs. Medical:  has a past medical history of Absolute anemia (04/11/2015), Acute postoperative pain (08/07/2017), Angina pectoris (HCC), Anxiety, Arthritis, Arthropathy of sacroiliac joint (04/11/2015), Atypical face pain (04/24/2015), Back pain, CAD (coronary artery disease), Cancer (HCC), Chronic back pain, Constipation, Depression, Fibromyalgia, GERD (gastroesophageal reflux disease), 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) (04/11/2015), H/O: hysterectomy (1979), Heart murmur, Hyperlipidemia, Hypertension, Low back pain (04/06/2015), Lumbar radicular pain (04/18/2015), Migraine, Narrowing of intervertebral disc space (04/11/2015), Sacroiliac joint pain (04/11/2015), and Spine disorder. Surgical: Ms. Heiny  has a past surgical history that includes Cholecystectomy; Abdominal hysterectomy; Neck surgery; Colonoscopy with propofol  (N/A, 07/19/2015); caract surger; Laparoscopic salpingo oophorectomy (Bilateral, 03/03/2018); Cystoscopy (03/03/2018); Tonsillectomy; Appendectomy; Tumor removal; Anterior lumbar fusion (N/A, 12/11/2018); Abdominal exposure (N/A, 12/11/2018); Anterior cervical decomp/discectomy fusion (N/A, 10/06/2020); Toe Fusion (Right); Esophagogastroduodenoscopy (egd) with propofol  (N/A, 09/11/2021); Colonoscopy with propofol  (N/A, 09/11/2021); Colonoscopy with propofol  (N/A, 03/05/2022); and Transforaminal lumbar interbody fusion w/ mis 1 level (Right, 04/30/2022). Family: family history includes Colon cancer in her maternal grandmother and mother; Diabetes in her father and mother; Hypertension in her sister; Stroke in her mother.  Laboratory Chemistry Profile   Renal Lab Results  Component Value Date  BUN 12 01/13/2023   CREATININE 1.00 01/13/2023   BCR 12 01/13/2023   GFRAA 47 (L) 12/16/2019   GFRNONAA  >60 04/24/2022    Hepatic Lab Results  Component Value Date   AST 15 01/13/2023   ALT 11 01/13/2023   ALBUMIN 3.7 (L) 01/13/2023   ALKPHOS 52 01/13/2023    Electrolytes Lab Results  Component Value Date   NA 141 01/13/2023   K 4.4 01/13/2023   CL 103 01/13/2023   CALCIUM  9.1 01/13/2023   MG 2.4 (H) 12/16/2019    Bone Lab Results  Component Value Date   VD25OH 33.0 01/13/2023   25OHVITD1 53 12/16/2019   25OHVITD2 <1.0 12/16/2019   25OHVITD3 53 12/16/2019    Inflammation (CRP: Acute Phase) (ESR: Chronic Phase) Lab Results  Component Value Date   CRP 4 12/16/2019   ESRSEDRATE 6 12/16/2019         Note: Above Lab results reviewed.  Recent Imaging Review  CT LUMBAR SPINE WO CONTRAST CLINICAL DATA:  Worsening radiculopathy. Pain radiating down the right leg.  EXAM: CT LUMBAR SPINE WITHOUT CONTRAST  TECHNIQUE: Multidetector CT imaging of the lumbar spine was performed without intravenous contrast administration. Multiplanar CT image reconstructions were also generated.  RADIATION DOSE REDUCTION: This exam was performed according to the departmental dose-optimization program which includes automated exposure control, adjustment of the mA and/or kV according to patient size and/or use of iterative reconstruction technique.  COMPARISON:  08/22/2022  FINDINGS: Segmentation: 5 lumbar type vertebrae.  Alignment: Normal.  Vertebrae: No acute fracture or aggressive osseous lesion.  Paraspinal and other soft tissues: Negative.  Other: Abdominal aortic atherosclerosis.  Disc levels:  Disc spaces: Posterior lumbar interbody fusion at L4-5. Anterior lumbar interbody fusion at L5-S1. Solid osseous fusion across the L4-5 and L5-S1 disc spaces. Degenerative disease with disc height loss at L2-3 and L3-4.  T12-L1: No disc protrusion, foraminal stenosis or central canal stenosis.  L1-L2: No disc protrusion, foraminal stenosis or central canal stenosis.  L2-L3:  Broad-based disc bulge. Mild bilateral facet arthropathy. No foraminal or central canal stenosis.  L3-L4: Broad-based disc bulge. Mild bilateral facet arthropathy. No foraminal stenosis. No spinal stenosis.  L4-L5: Interbody fusion. Beam hardening artifact partially obscures the adjacent soft tissue and osseous structures. No definite foraminal stenosis.  L5-S1: Interbody fusion. No foraminal or central canal stenosis. Moderate-severe bilateral facet arthropathy.  IMPRESSION: 1. Posterior lumbar interbody fusion at L4-5. Anterior lumbar interbody fusion at L5-S1. Solid osseous fusion across the L4-5 and L5-S1 disc spaces. Moderate-severe bilateral facet arthropathy at L5-S1. 2. Lumbar spine spondylosis as described above. 3. No acute osseous injury of the lumbar spine. 4.  Aortic Atherosclerosis (ICD10-I70.0).  Electronically Signed   By: Julaine Blanch M.D.   On: 04/07/2023 08:46 Note: Reviewed        Physical Exam  General appearance: Well nourished, well developed, and well hydrated. In no apparent acute distress Mental status: Alert, oriented x 3 (person, place, & time)       Respiratory: No evidence of acute respiratory distress Eyes: PERLA Vitals: BP (!) 163/76   Pulse 84   Temp (!) 96.8 F (36 C) (Temporal)   Resp 16   Ht 5' (1.524 m)   Wt 131 lb (59.4 kg)   SpO2 99%   BMI 25.58 kg/m  BMI: Estimated body mass index is 25.58 kg/m as calculated from the following:   Height as of this encounter: 5' (1.524 m).   Weight as of this encounter: 131 lb (  59.4 kg). Ideal: Ideal body weight: 45.5 kg (100 lb 4.9 oz) Adjusted ideal body weight: 51.1 kg (112 lb 9.4 oz)  Assessment   Diagnosis Status  1. Chronic pain syndrome   2. Chronic low back pain (Bilateral) (R>L) w/o sciatica   3. Chronic lower extremity pain (Bilateral) (R>L)   4. Chronic hip pain (Bilateral) (L>R)   5. Chronic sacroiliac joint pain (Bilateral) (L>R)   6. Chronic neck pain (Left)   7. Lumbar  facet syndrome (Bilateral) (R>L)   8. Trochanteric bursitis of hip (Bilateral) (L>R)   9. Failed back surgical syndrome   10. Pharmacologic therapy   11. Chronic use of opiate for therapeutic purpose   12. Encounter for medication management   13. Encounter for chronic pain management    Controlled Controlled Controlled   Updated Problems: No problems updated.  Plan of Care  Problem-specific:  Assessment and Plan    Chronic Lower Back Pain   Chronic lower back pain is localized to the right facet and sacrum, with no adverse reactions to current pain medication. An injection is scheduled for July 15, 2023, with approval. Pain mapping will be performed on the day of the procedure to ensure accurate targeting. We will evaluate the effectiveness of the injection and consider radiofrequency ablation if effective and approved. The discussion included pain mapping and the potential for radiofrequency ablation.  Medication Management for Pain   She did not bring her medication bottle for verification of controlled substances. We will provide a 30-day refill and have reminded her to bring the medication bottle with remaining pills for the next visit. Urine drug screening and prescription monitoring are up to date.  Follow-up   A follow-up visit is scheduled in 30 days.       Ms. QUINLYNN CUTHBERT has a current medication list which includes the following long-term medication(s): atorvastatin , viactiv calcium  plus d, cyanocobalamin , duloxetine , enalapril , estradiol , furosemide , gabapentin , naloxone , oxycodone -acetaminophen , spironolactone , and omeprazole.  Pharmacotherapy (Medications Ordered): Meds ordered this encounter  Medications   oxyCODONE -acetaminophen  (PERCOCET) 7.5-325 MG tablet    Sig: Take 1 tablet by mouth 5 (five) times daily. Must last 30 days    Dispense:  150 tablet    Refill:  0    DO NOT: delete (not duplicate); no partial-fill (will deny script to complete), no  refill request (F/U required). DISPENSE: 1 day early if closed on fill date. WARN: No CNS-depressants within 8 hrs of med.   naloxone  (NARCAN ) nasal spray 4 mg/0.1 mL    Sig: Place 1 spray into the nose as needed for up to 365 doses (for opioid-induced respiratory depresssion). In case of emergency (overdose), spray once into each nostril. If no response within 3 minutes, repeat application and call 911.    Dispense:  1 each    Refill:  0    Instruct patient in proper use of device.   Orders:  No orders of the defined types were placed in this encounter.  Follow-up plan:   Return in about 30 days (around 08/08/2023) for Eval-day (M,W), (F2F), (MM).      Interventional Therapies  Risk Factors  Considerations:     Lumbar HARDWARE (NO RFA)     Planned  Pending:   Diagnostic bilateral lumbar MBB (pedicle screw hardware inj.)  #1 (12/17/2022)  Diagnostic/therapeutic midline caudal ESI #2 + sacral interspinous ligament MNB/TPI #1    Under consideration:   Diagnostic lumbar SCS trial    Completed:   Diagnostic bilateral lumbar MBB (  pedicle screw hardware inj.) x1 (12/17/2022)  Diagnostic bilateral L4 TFESI x1 (01/08/2022) (100/25/0)  Therapeutic right Racz procedure x1 (08/17/2020) (04/09/84/85) (100% relief of the right LEP) Diagnostic caudal ESI x1 (07/11/2020) (100/100/0/0)  Palliative left L5-S1 LESI x2 (07/27/2015) (70/70/0/0)  Diagnostic left lumbar facet MBB x7 (02/07/2022) (20/20/0/0)  Palliative right lumbar facet MBB x10 (02/07/2022) (20/20/0/0)  Therapeutic right shoulder joint inj. x1 (02/29/2016) (90/80/30)  Therapeutic left IA hip injection x3 (06/13/2020) (100/100/30/>50)  Diagnostic right SI joint Blk x3 (06/17/2017) (100/90/80/80)  Diagnostic/therapeutic left SI joint Blk x4 (06/17/2017) (100/90/80/80)  Palliative bilateral trochanteric bursa inj. x1 (12/11/2016) (100/100/90/90)  Therapeutic right lumbar facet RFA x2 (01/11/2021) (100/100/70)  Therapeutic left lumbar facet  RFA x2 (12/19/2020) (100/100/75/>75) Therapeutic right SI joint RFA x1 (08/07/2017) (100/100/80)  Therapeutic left SI joint RFA x1 (09/16/2017) (100/100/100)    Therapeutic  Palliative (PRN) options:   Palliative Racz procedure  Palliative bilateral SI joint block  Palliative right trochanteric bursa injection    Pharmacotherapy  Nonopioids transferred 05/24/2020: Vitamin B12, Flexeril , and Neurontin .           Recent Visits Date Type Provider Dept  06/03/23 Office Visit Tanya Glisson, MD [facility]  Showing recent visits within past 90 days and meeting all other requirements Today's Visits Date Type Provider Dept  07/09/23 Office Visit Tanya Glisson, MD [facility]  Showing today's visits and meeting all other requirements Future Appointments Date Type Provider Dept  07/15/23 Appointment Tanya Glisson, MD [facility]  Showing future appointments within next 90 days and meeting all other requirements  I discussed the assessment and treatment plan with the patient. The patient was provided an opportunity to ask questions and all were answered. The patient agreed with the plan and demonstrated an understanding of the instructions.  Patient advised to call back or seek an in-person evaluation if the symptoms or condition worsens.  Duration of encounter: 30 minutes.  Total time on encounter, as per AMA guidelines included both the face-to-face and non-face-to-face time personally spent by the physician and/or other qualified health care professional(s) on the day of the encounter (includes time in activities that require the physician or other qualified health care professional and does not include time in activities normally performed by clinical staff). Physician's time may include the following activities when performed: Preparing to see the patient (e.g., pre-charting review of records, searching for previously ordered imaging, lab  work, and nerve conduction tests) Review of prior analgesic pharmacotherapies. Reviewing PMP Interpreting ordered tests (e.g., lab work, imaging, nerve conduction tests) Performing post-procedure evaluations, including interpretation of diagnostic procedures Obtaining and/or reviewing separately obtained history Performing a medically appropriate examination and/or evaluation Counseling and educating the patient/family/caregiver Ordering medications, tests, or procedures Referring and communicating with other health care professionals (when not separately reported) Documenting clinical information in the electronic or other health record Independently interpreting results (not separately reported) and communicating results to the patient/ family/caregiver Care coordination (not separately reported)  Note by: Glisson DELENA Tanya, MD Date: 07/09/2023; Time: 11:52 AM

## 2023-07-09 ENCOUNTER — Encounter: Payer: Self-pay | Admitting: Pain Medicine

## 2023-07-09 ENCOUNTER — Ambulatory Visit: Payer: Medicare Other | Attending: Pain Medicine | Admitting: Pain Medicine

## 2023-07-09 VITALS — BP 163/76 | HR 84 | Temp 96.8°F | Resp 16 | Ht 60.0 in | Wt 131.0 lb

## 2023-07-09 DIAGNOSIS — M545 Low back pain, unspecified: Secondary | ICD-10-CM | POA: Insufficient documentation

## 2023-07-09 DIAGNOSIS — Z79899 Other long term (current) drug therapy: Secondary | ICD-10-CM | POA: Diagnosis present

## 2023-07-09 DIAGNOSIS — M47816 Spondylosis without myelopathy or radiculopathy, lumbar region: Secondary | ICD-10-CM | POA: Diagnosis present

## 2023-07-09 DIAGNOSIS — M961 Postlaminectomy syndrome, not elsewhere classified: Secondary | ICD-10-CM | POA: Diagnosis present

## 2023-07-09 DIAGNOSIS — M25551 Pain in right hip: Secondary | ICD-10-CM | POA: Diagnosis present

## 2023-07-09 DIAGNOSIS — M533 Sacrococcygeal disorders, not elsewhere classified: Secondary | ICD-10-CM | POA: Insufficient documentation

## 2023-07-09 DIAGNOSIS — M25552 Pain in left hip: Secondary | ICD-10-CM | POA: Diagnosis present

## 2023-07-09 DIAGNOSIS — M79604 Pain in right leg: Secondary | ICD-10-CM | POA: Diagnosis present

## 2023-07-09 DIAGNOSIS — M7062 Trochanteric bursitis, left hip: Secondary | ICD-10-CM | POA: Diagnosis present

## 2023-07-09 DIAGNOSIS — M7061 Trochanteric bursitis, right hip: Secondary | ICD-10-CM | POA: Diagnosis present

## 2023-07-09 DIAGNOSIS — M79605 Pain in left leg: Secondary | ICD-10-CM | POA: Diagnosis present

## 2023-07-09 DIAGNOSIS — G8929 Other chronic pain: Secondary | ICD-10-CM | POA: Diagnosis present

## 2023-07-09 DIAGNOSIS — G894 Chronic pain syndrome: Secondary | ICD-10-CM | POA: Insufficient documentation

## 2023-07-09 DIAGNOSIS — M542 Cervicalgia: Secondary | ICD-10-CM | POA: Insufficient documentation

## 2023-07-09 DIAGNOSIS — Z79891 Long term (current) use of opiate analgesic: Secondary | ICD-10-CM | POA: Diagnosis present

## 2023-07-09 MED ORDER — NALOXONE HCL 4 MG/0.1ML NA LIQD: 1.0000 | NASAL | 0 refills | Status: DC | PRN

## 2023-07-09 MED ORDER — OXYCODONE-ACETAMINOPHEN 7.5-325 MG PO TABS: 1.0000 | ORAL_TABLET | Freq: Every day | ORAL | 0 refills | Status: DC

## 2023-07-09 NOTE — Progress Notes (Signed)
 Nursing Pain Medication Assessment:  Safety precautions to be maintained throughout the outpatient stay will include: orient to surroundings, keep bed in low position, maintain call bell within reach at all times, provide assistance with transfer out of bed and ambulation.  Medication Inspection Compliance: Jacqueline Thornton did not comply with our request to bring her pills to be counted. She was reminded that bringing the medication bottles, even when empty, is a requirement.  Medication: None brought in. Pill/Patch Count: None available to be counted. Bottle Appearance: No container available. Did not bring bottle(s) to appointment. Filled Date: N/A Last Medication intake:  Yesterday

## 2023-07-15 ENCOUNTER — Ambulatory Visit
Admission: RE | Admit: 2023-07-15 | Discharge: 2023-07-15 | Disposition: A | Discharge status: Home / Self Care | Payer: Medicare Other | Source: Ambulatory Visit | Attending: Pain Medicine | Admitting: Pain Medicine

## 2023-07-15 ENCOUNTER — Ambulatory Visit: Payer: Medicare Other | Attending: Pain Medicine | Admitting: Pain Medicine

## 2023-07-15 ENCOUNTER — Encounter: Payer: Self-pay | Admitting: Pain Medicine

## 2023-07-15 VITALS — BP 151/88 | HR 78 | Temp 97.2°F | Resp 16 | Ht 60.0 in | Wt 131.0 lb

## 2023-07-15 DIAGNOSIS — G8929 Other chronic pain: Secondary | ICD-10-CM | POA: Diagnosis present

## 2023-07-15 DIAGNOSIS — R937 Abnormal findings on diagnostic imaging of other parts of musculoskeletal system: Secondary | ICD-10-CM | POA: Diagnosis present

## 2023-07-15 DIAGNOSIS — M47816 Spondylosis without myelopathy or radiculopathy, lumbar region: Secondary | ICD-10-CM | POA: Insufficient documentation

## 2023-07-15 DIAGNOSIS — M961 Postlaminectomy syndrome, not elsewhere classified: Secondary | ICD-10-CM | POA: Insufficient documentation

## 2023-07-15 DIAGNOSIS — T85848A Pain due to other internal prosthetic devices, implants and grafts, initial encounter: Secondary | ICD-10-CM | POA: Insufficient documentation

## 2023-07-15 DIAGNOSIS — M5459 Other low back pain: Secondary | ICD-10-CM | POA: Diagnosis present

## 2023-07-15 DIAGNOSIS — T85848S Pain due to other internal prosthetic devices, implants and grafts, sequela: Secondary | ICD-10-CM | POA: Diagnosis present

## 2023-07-15 DIAGNOSIS — M5136 Other intervertebral disc degeneration, lumbar region with discogenic back pain only: Secondary | ICD-10-CM | POA: Insufficient documentation

## 2023-07-15 DIAGNOSIS — Z5189 Encounter for other specified aftercare: Secondary | ICD-10-CM | POA: Diagnosis present

## 2023-07-15 DIAGNOSIS — M545 Low back pain, unspecified: Secondary | ICD-10-CM | POA: Diagnosis not present

## 2023-07-15 DIAGNOSIS — M4317 Spondylolisthesis, lumbosacral region: Secondary | ICD-10-CM | POA: Insufficient documentation

## 2023-07-15 DIAGNOSIS — M47817 Spondylosis without myelopathy or radiculopathy, lumbosacral region: Secondary | ICD-10-CM | POA: Diagnosis not present

## 2023-07-15 MED ORDER — ROPIVACAINE HCL 2 MG/ML IJ SOLN
18.0000 mL | Freq: Once | INTRAMUSCULAR | Status: AC
  Administered 2023-07-15: 18 mL via PERINEURAL

## 2023-07-15 MED ORDER — TRIAMCINOLONE ACETONIDE 40 MG/ML IJ SUSP
80.0000 mg | Freq: Once | INTRAMUSCULAR | Status: AC
  Administered 2023-07-15: 80 mg

## 2023-07-15 MED ORDER — LIDOCAINE HCL 2 % IJ SOLN
INTRAMUSCULAR | Status: AC
Start: 2023-07-15 — End: ?
  Filled 2023-07-15: qty 20

## 2023-07-15 MED ORDER — ROPIVACAINE HCL 2 MG/ML IJ SOLN
INTRAMUSCULAR | Status: AC
  Filled 2023-07-15: qty 20

## 2023-07-15 MED ORDER — MIDAZOLAM HCL 5 MG/5ML IJ SOLN
INTRAMUSCULAR | Status: AC
  Filled 2023-07-15: qty 5

## 2023-07-15 MED ORDER — TRIAMCINOLONE ACETONIDE 40 MG/ML IJ SUSP
INTRAMUSCULAR | Status: AC
  Filled 2023-07-15: qty 2

## 2023-07-15 MED ORDER — PENTAFLUOROPROP-TETRAFLUOROETH EX AERO
INHALATION_SPRAY | Freq: Once | CUTANEOUS | Status: AC
Start: 2023-07-15 — End: 2023-07-15
  Administered 2023-07-15: 30 via TOPICAL

## 2023-07-15 MED ORDER — FENTANYL CITRATE (PF) 100 MCG/2ML IJ SOLN
25.0000 ug | INTRAMUSCULAR | Status: DC | PRN
  Administered 2023-07-15: 100 ug via INTRAVENOUS

## 2023-07-15 MED ORDER — MIDAZOLAM HCL 5 MG/5ML IJ SOLN
0.5000 mg | Freq: Once | INTRAMUSCULAR | Status: AC
  Administered 2023-07-15: 2 mg via INTRAVENOUS

## 2023-07-15 MED ORDER — LIDOCAINE HCL 2 % IJ SOLN
20.0000 mL | Freq: Once | INTRAMUSCULAR | Status: AC
Start: 2023-07-15 — End: 2023-07-15
  Administered 2023-07-15: 400 mg

## 2023-07-15 MED ORDER — FENTANYL CITRATE (PF) 100 MCG/2ML IJ SOLN
INTRAMUSCULAR | Status: AC
  Filled 2023-07-15: qty 2

## 2023-07-15 NOTE — Progress Notes (Signed)
 PROVIDER NOTE: Interpretation of information contained herein should be left to medically-trained personnel. Specific patient instructions are provided elsewhere under Patient Instructions section of medical record. This document was created in part using STT-dictation technology, any transcriptional errors that may result from this process are unintentional.  Patient: Jacqueline Thornton Type: Established DOB: May 07, 1951 MRN: [id number] PCP: Fernand Fredy RAMAN, MD  Service: Procedure DOS: 07/15/2023 Setting: Ambulatory Location: Ambulatory outpatient facility Delivery: Face-to-face Provider: Eric DELENA Como, MD Specialty: Interventional Pain Management Specialty designation: 09 Location: Outpatient facility Ref. Prov.: Como Eric, MD       Interventional Therapy   Type: Lumbar Fusion (Pedicle screw) Hardware Block (MBB)   #2  Laterality: Bilateral  Level: L2, L3, L4, and L5 Medial Branch Level(s).  Imaging: Fluoroscopic guidance Spinal ([id number]) Anesthesia: Local anesthesia (1-2% Lidocaine ) Anxiolysis: IV Versed  2.0 mg Sedation: Moderate Sedation Fentanyl  2.0 mL (100 mcg) DOS: 07/15/2023 Performed by: Eric DELENA Como, MD  Primary Purpose: Diagnostic/Therapeutic Indications: Low back pain severe enough to impact quality of life or function. 1. Chronic low back pain (Bilateral) (R>L) w/o sciatica   2. Pain from implanted hardware, sequela   3. Lumbar facet joint pain   4. Lumbar facet syndrome (Bilateral) (R>L)   5. Lumbar facet hypertrophy   6. Spondylosis without myelopathy or radiculopathy, lumbosacral region   7. Failed back surgical syndrome   8. Spondylolisthesis at L5-S1 level   9. Encounter for therapeutic procedure    NAS-11 Pain score:   Pre-procedure: 4 /10   Post-procedure: 0-No pain/10     Position / Prep / Materials:  Position: Prone  Prep solution: ChloraPrep (2% chlorhexidine  gluconate and 70% isopropyl alcohol) Area Prepped: Posterolateral  Lumbosacral Spine (Wide prep: From the lower border of the scapula down to the end of the tailbone and from flank to flank.)  Materials:  Tray: Block Needle(s):  Type: Spinal  Gauge (G): 22  Length: 3.5-in Qty: 4      H&P (Pre-op Assessment):  [name] is a 73 y.o. (year old), female patient, seen today for interventional treatment. She  has a past surgical history that includes Cholecystectomy; Abdominal hysterectomy; Neck surgery; Colonoscopy with propofol  (N/A, 07/19/2015); caract surger; Laparoscopic salpingo oophorectomy (Bilateral, 03/03/2018); Cystoscopy (03/03/2018); Tonsillectomy; Appendectomy; Tumor removal; Anterior lumbar fusion (N/A, 12/11/2018); Abdominal exposure (N/A, 12/11/2018); Anterior cervical decomp/discectomy fusion (N/A, 10/06/2020); Toe Fusion (Right); Esophagogastroduodenoscopy (egd) with propofol  (N/A, 09/11/2021); Colonoscopy with propofol  (N/A, 09/11/2021); Colonoscopy with propofol  (N/A, 03/05/2022); and Transforaminal lumbar interbody fusion w/ mis 1 level (Right, 04/30/2022). [name] has a current medication list which includes the following prescription(s): atorvastatin , viactiv calcium  plus d, cyanocobalamin , docusate sodium , duloxetine , enalapril , estradiol , furosemide , gabapentin , magnesium, melatonin, methocarbamol , multivitamin with minerals, naloxone , oxycodone -acetaminophen , spironolactone , and omeprazole, and the following Facility-Administered Medications: fentanyl . Her primarily concern today is the Back Pain  Initial Vital Signs:  Pulse/HCG Rate: 78ECG Heart Rate: 60 Temp: (!) 97.2 F (36.2 C) Resp: 16 BP: (!) 149/82 SpO2: 98 %  BMI: Estimated body mass index is 25.58 kg/m as calculated from the following:   Height as of this encounter: 5' (1.524 m).   Weight as of this encounter: 131 lb (59.4 kg).  Risk Assessment: Allergies: Reviewed. She has no known allergies.  Allergy Precautions: None required Coagulopathies: Reviewed. None  identified.  Blood-thinner therapy: None at this time Active Infection(s): Reviewed. None identified. [name] is afebrile  Site Confirmation: [name] was asked to confirm the procedure and laterality before marking the site Procedure checklist: Completed Consent: Before  the procedure and under the influence of no sedative(s), amnesic(s), or anxiolytics, the patient was informed of the treatment options, risks and possible complications. To fulfill our ethical and legal obligations, as recommended by the American Medical Association's Code of Ethics, I have informed the patient of my clinical impression; the nature and purpose of the treatment or procedure; the risks, benefits, and possible complications of the intervention; the alternatives, including doing nothing; the risk(s) and benefit(s) of the alternative treatment(s) or procedure(s); and the risk(s) and benefit(s) of doing nothing. The patient was provided information about the general risks and possible complications associated with the procedure. These may include, but are not limited to: failure to achieve desired goals, infection, bleeding, organ or nerve damage, allergic reactions, paralysis, and death. In addition, the patient was informed of those risks and complications associated to Spine-related procedures, such as failure to decrease pain; infection (i.e.: Meningitis, epidural or intraspinal abscess); bleeding (i.e.: epidural hematoma, subarachnoid hemorrhage, or any other type of intraspinal or peri-dural bleeding); organ or nerve damage (i.e.: Any type of peripheral nerve, nerve root, or spinal cord injury) with subsequent damage to sensory, motor, and/or autonomic systems, resulting in permanent pain, numbness, and/or weakness of one or several areas of the body; allergic reactions; (i.e.: anaphylactic reaction); and/or death. Furthermore, the patient was informed of those risks and complications associated with the medications.  These include, but are not limited to: allergic reactions (i.e.: anaphylactic or anaphylactoid reaction(s)); adrenal axis suppression; blood sugar elevation that in diabetics may result in ketoacidosis or comma; water  retention that in patients with history of congestive heart failure may result in shortness of breath, pulmonary edema, and decompensation with resultant heart failure; weight gain; swelling or edema; medication-induced neural toxicity; particulate matter embolism and blood vessel occlusion with resultant organ, and/or nervous system infarction; and/or aseptic necrosis of one or more joints. Finally, the patient was informed that Medicine is not an exact science; therefore, there is also the possibility of unforeseen or unpredictable risks and/or possible complications that may result in a catastrophic outcome. The patient indicated having understood very clearly. We have given the patient no guarantees and we have made no promises. Enough time was given to the patient to ask questions, all of which were answered to the patient's satisfaction. Ms. Closser has indicated that she wanted to continue with the procedure. Attestation: I, the ordering provider, attest that I have discussed with the patient the benefits, risks, side-effects, alternatives, likelihood of achieving goals, and potential problems during recovery for the procedure that I have provided informed consent. Date  Time: 07/15/2023  8:43 AM  Pre-Procedure Preparation:  Monitoring: As per clinic protocol. Respiration, ETCO2, SpO2, BP, heart rate and rhythm monitor placed and checked for adequate function Safety Precautions: Patient was assessed for positional comfort and pressure points before starting the procedure. Time-out: I initiated and conducted the Time-out before starting the procedure, as per protocol. The patient was asked to participate by confirming the accuracy of the Time Out information. Verification of the  correct person, site, and procedure were performed and confirmed by me, the nursing staff, and the patient. Time-out conducted as per Joint Commission's Universal Protocol (UP.01.01.01). Time: 0925 Start Time: 0925 hrs.  Description of Procedure:          Laterality: (see above) Targeted Levels: (see above)  Safety Precautions: Aspiration looking for blood return was conducted prior to all injections. At no point did we inject any substances, as a needle was being advanced. Before  injecting, the patient was told to immediately notify me if she was experiencing any new onset of ringing in the ears, or metallic taste in the mouth. No attempts were made at seeking any paresthesias. Safe injection practices and needle disposal techniques used. Medications properly checked for expiration dates. SDV (single dose vial) medications used. After the completion of the procedure, all disposable equipment used was discarded in the proper designated medical waste containers. Local Anesthesia: Protocol guidelines were followed. The patient was positioned over the fluoroscopy table. The area was prepped in the usual manner. The time-out was completed. The target area was identified using fluoroscopy. A 12-in long, straight, sterile hemostat was used with fluoroscopic guidance to locate the targets for each level blocked. Once located, the skin was marked with an approved surgical skin marker. Once all sites were marked, the skin (epidermis, dermis, and hypodermis), as well as deeper tissues (fat, connective tissue and muscle) were infiltrated with a small amount of a short-acting local anesthetic, loaded on a 10cc syringe with a 25G, 1.5-in  Needle. An appropriate amount of time was allowed for local anesthetics to take effect before proceeding to the next step. Local Anesthetic: Lidocaine  2.0% The unused portion of the local anesthetic was discarded in the proper designated containers. Technical description of  process:  L2 Medial Branch Nerve Block (MBB): The target area for the L2 medial branch is at the junction of the postero-lateral aspect of the superior articular process and the superior, posterior, and medial edge of the transverse process of L3. Under fluoroscopic guidance, a Quincke needle was inserted until contact was made with os over the superior postero-lateral aspect of the pedicular shadow (target area). After negative aspiration for blood, 0.5 mL of the nerve block solution was injected without difficulty or complication. The needle was removed intact. L3 Medial Branch Nerve Block (MBB): The target area for the L3 medial branch is at the junction of the postero-lateral aspect of the superior articular process and the superior, posterior, and medial edge of the transverse process of L4. Under fluoroscopic guidance, a Quincke needle was inserted until contact was made with os over the superior postero-lateral aspect of the pedicular shadow (target area). After negative aspiration for blood, 0.5 mL of the nerve block solution was injected without difficulty or complication. The needle was removed intact. L4 Medial Branch Nerve Block (MBB): The target area for the L4 medial branch is at the junction of the postero-lateral aspect of the superior articular process and the superior, posterior, and medial edge of the transverse process of L5. Under fluoroscopic guidance, a Quincke needle was inserted until contact was made with os over the superior postero-lateral aspect of the pedicular shadow (target area). After negative aspiration for blood, 0.5 mL of the nerve block solution was injected without difficulty or complication. The needle was removed intact. L5 Medial Branch Nerve Block (MBB): The target area for the L5 medial branch is at the junction of the postero-lateral aspect of the superior articular process and the superior, posterior, and medial edge of the sacral ala. Under fluoroscopic guidance, a  Quincke needle was inserted until contact was made with os over the superior postero-lateral aspect of the pedicular shadow (target area). After negative aspiration for blood, 0.5 mL of the nerve block solution was injected without difficulty or complication. The needle was removed intact.  Once the entire procedure was completed, the treated area was cleaned, making sure to leave some of the prepping solution back to take advantage  of its long term bactericidal properties.         Illustration of the posterior view of the lumbar spine and the posterior neural structures. Laminae of L2 through S1 are labeled. DPRL5, dorsal primary ramus of L5; DPRS1, dorsal primary ramus of S1; DPR3, dorsal primary ramus of L3; FJ, facet (zygapophyseal) joint L3-L4; I, inferior articular process of L4; LB1, lateral branch of dorsal primary ramus of L1; IAB, inferior articular branches from L3 medial branch (supplies L4-L5 facet joint); IBP, intermediate branch plexus; MB3, medial branch of dorsal primary ramus of L3; NR3, third lumbar nerve root; S, superior articular process of L5; SAB, superior articular branches from L4 (supplies L4-5 facet joint also); TP3, transverse process of L3.   Facet Joint Innervation (* possible contribution)  L1-2 T12, L1 (L2*)  Medial Branch  L2-3 L1, L2 (L3*)                     L3-4 L2, L3 (L4*)                     L4-5 L3, L4 (L5*)                     L5-S1 L4, L5, S1                        Vitals:   07/15/23 0935 07/15/23 0939 07/15/23 0946 07/15/23 0956  BP: (!) 130/93 134/62 128/72 (!) 148/96  Pulse:      Resp: 10 14 15 16   Temp:      SpO2: 100% 100%    Weight:      Height:         End Time: 0938 hrs.  Imaging Guidance (Spinal):          Type of Imaging Technique: Fluoroscopy Guidance (Spinal) Indication(s): Fluoroscopy guidance for needle placement to enhance accuracy in procedures requiring precise needle localization for targeted delivery of medication  in or near specific anatomical locations not easily accessible without such real-time imaging assistance. Exposure Time: Please see nurses notes. Contrast: None used. Fluoroscopic Guidance: I was personally present during the use of fluoroscopy. Tunnel Vision Technique used to obtain the best possible view of the target area. Parallax error corrected before commencing the procedure. Direction-depth-direction technique used to introduce the needle under continuous pulsed fluoroscopy. Once target was reached, antero-posterior, oblique, and lateral fluoroscopic projection used confirm needle placement in all planes. Images permanently stored in EMR. Interpretation: No contrast injected. I personally interpreted the imaging intraoperatively. Adequate needle placement confirmed in multiple planes. Permanent images saved into the patient's record.  Post-operative Assessment:  Post-procedure Vital Signs:  Pulse/HCG Rate: 7876 Temp: (!) 97.2 F (36.2 C) Resp: 16 BP: (!) 148/96 SpO2: 100 %  EBL: None  Complications: No immediate post-treatment complications observed by team, or reported by patient.  Note: The patient tolerated the entire procedure well. A repeat set of vitals were taken after the procedure and the patient was kept under observation following institutional policy, for this type of procedure. Post-procedural neurological assessment was performed, showing return to baseline, prior to discharge. The patient was provided with post-procedure discharge instructions, including a section on how to identify potential problems. Should any problems arise concerning this procedure, the patient was given instructions to immediately contact us , at any time, without hesitation. In any case, we plan to contact the patient by telephone for a follow-up status report regarding this  interventional procedure.  Comments:  No additional relevant information.  Plan of Care (POC)  Orders:  Orders Placed  This Encounter  Procedures   LUMBAR FACET(MEDIAL BRANCH NERVE BLOCK) MBNB    Scheduling Instructions:     Procedure: Lumbar facet block (AKA.: Lumbosacral medial branch nerve block)     Side: Bilateral     Level: L4-5 and L5-S1 Facets (L3, L4, L5, and S1 Medial Branch Nerves)     Sedation: Patient's choice.     Timeframe: Today    Where will this procedure be performed?:   [facility]   DG [facility] C-ARM 1-60 MIN NO REPORT    Intraoperative interpretation by procedural physician at [facility].    Standing Status:   Standing    Number of Occurrences:   1    Reason for exam::   Assistance in needle guidance and placement for procedures requiring needle placement in or near specific anatomical locations not easily accessible without such assistance.   Informed Consent Details: Physician/Practitioner Attestation; Transcribe to consent form and obtain patient signature    Nursing Order: Transcribe to consent form and obtain patient signature. Note: Always confirm laterality of pain with Ms. Feggins, before procedure.    Physician/Practitioner attestation of informed consent for procedure/surgical case:   I, the physician/practitioner, attest that I have discussed with the patient the benefits, risks, side effects, alternatives, likelihood of achieving goals and potential problems during recovery for the procedure that I have provided informed consent.    Procedure:   Lumbar Facet Block  under fluoroscopic guidance    Physician/Practitioner performing the procedure:   Elwyn Klosinski A. Tanya MD    Indication/Reason:   Low Back Pain, with our without leg pain, due to Facet Joint Arthralgia (Joint Pain) Spondylosis (Arthritis of the Spine), without myelopathy or radiculopathy (Nerve Damage).   Provide equipment / supplies at bedside    Procedure tray: Block Tray (Disposable  single use) Skin infiltration needle: Regular 1.5-in, 25-G, (x1) Block Needle type:  Spinal Amount/quantity: 3 Size: Regular (3.5-inch) Gauge: 22G    Standing Status:   Standing    Number of Occurrences:   1    Specify:   Block Tray   Saline lock IV    Have LR [phone] mL available and administer at 125 mL/hr if patient becomes hypotensive.    Standing Status:   Standing    Number of Occurrences:   1   Chronic Opioid Analgesic:   Oxycodone /APAP 7.5/325, 1 tab PO q 5 times a day. (56.25 mg/day of oxycodone ) MME/day: 56.25 mg/day.   Medications ordered for procedure: Meds ordered this encounter  Medications   lidocaine  (XYLOCAINE ) 2 % (with pres) injection 400 mg   pentafluoroprop-tetrafluoroeth (GEBAUERS) aerosol   midazolam  (VERSED ) 5 MG/5ML injection 0.5-2 mg    Make sure Flumazenil is available in the pyxis when using this medication. If oversedation occurs, administer 0.2 mg IV over 15 sec. If after 45 sec no response, administer 0.2 mg again over 1 min; may repeat at 1 min intervals; not to exceed 4 doses (1 mg)   fentaNYL  (SUBLIMAZE ) injection 25-50 mcg    Make sure Narcan  is available in the pyxis when using this medication. In the event of respiratory depression (RR< 8/min): Titrate NARCAN  (naloxone ) in increments of 0.1 to 0.2 mg IV at 2-3 minute intervals, until desired degree of reversal.   ropivacaine  (PF) 2 mg/mL (0.2%) (NAROPIN ) injection 18 mL   triamcinolone  acetonide (KENALOG -40) injection 80 mg  Medications administered: We administered lidocaine , pentafluoroprop-tetrafluoroeth, midazolam , fentaNYL , ropivacaine  (PF) 2 mg/mL (0.2%), and triamcinolone  acetonide.  See the medical record for exact dosing, route, and time of administration.  Follow-up plan:   Return in about 2 weeks (around 07/29/2023) for (Face2F), (PPE).       Interventional Therapies  Risk Factors  Considerations:     Lumbar HARDWARE (NO RFA)     Planned  Pending:   Diagnostic bilateral lumbar MBB (pedicle screw hardware inj.)  #1 (12/17/2022)  Diagnostic/therapeutic  midline caudal ESI #2 + sacral interspinous ligament MNB/TPI #1    Under consideration:   Diagnostic lumbar SCS trial    Completed:   Diagnostic bilateral lumbar MBB (pedicle screw hardware inj.) x1 (12/17/2022)  Diagnostic bilateral L4 TFESI x1 (01/08/2022) (100/25/0)  Therapeutic right Racz procedure x1 (08/17/2020) (04/09/84/85) (100% relief of the right LEP) Diagnostic caudal ESI x1 (07/11/2020) (100/100/0/0)  Palliative left L5-S1 LESI x2 (07/27/2015) (70/70/0/0)  Diagnostic left lumbar facet MBB x7 (02/07/2022) (20/20/0/0)  Palliative right lumbar facet MBB x10 (02/07/2022) (20/20/0/0)  Therapeutic right shoulder joint inj. x1 (02/29/2016) (90/80/30)  Therapeutic left IA hip injection x3 (06/13/2020) (100/100/30/>50)  Diagnostic right SI joint Blk x3 (06/17/2017) (100/90/80/80)  Diagnostic/therapeutic left SI joint Blk x4 (06/17/2017) (100/90/80/80)  Palliative bilateral trochanteric bursa inj. x1 (12/11/2016) (100/100/90/90)  Therapeutic right lumbar facet RFA x2 (01/11/2021) (100/100/70)  Therapeutic left lumbar facet RFA x2 (12/19/2020) (100/100/75/>75) Therapeutic right SI joint RFA x1 (08/07/2017) (100/100/80)  Therapeutic left SI joint RFA x1 (09/16/2017) (100/100/100)    Therapeutic  Palliative (PRN) options:   Palliative Racz procedure  Palliative bilateral SI joint block  Palliative right trochanteric bursa injection    Pharmacotherapy  Nonopioids transferred 05/24/2020: Vitamin B12, Flexeril , and Neurontin .      Recent Visits Date Type Provider Dept  07/09/23 Office Visit Tanya Glisson, MD [facility]  06/03/23 Office Visit Tanya Glisson, MD [facility]  Showing recent visits within past 90 days and meeting all other requirements Today's Visits Date Type Provider Dept  07/15/23 Procedure visit Tanya Glisson, MD [facility]  Showing today's visits and meeting all other requirements Future Appointments Date Type Provider Dept   07/29/23 Appointment Tanya Glisson, MD [facility]  08/06/23 Appointment Tanya Glisson, MD [facility]  Showing future appointments within next 90 days and meeting all other requirements  Disposition: Discharge home  Discharge (Date  Time): 07/15/2023; 1007 hrs.   Primary Care Physician: Fernand Fredy RAMAN, MD Location: [facility] Note by: Glisson DELENA Tanya, MD (TTS technology used. I apologize for any typographical errors that were not detected and corrected.) Date: 07/15/2023; Time: 10:07 AM  Disclaimer:  Medicine is not an visual merchandiser. The only guarantee in medicine is that nothing is guaranteed. It is important to note that the decision to proceed with this intervention was based on the information collected from the patient. The Data and conclusions were drawn from the patient's questionnaire, the interview, and the physical examination. Because the information was provided in large part by the patient, it cannot be guaranteed that it has not been purposely or unconsciously manipulated. Every effort has been made to obtain as much relevant data as possible for this evaluation. It is important to note that the conclusions that lead to this procedure are derived in large part from the available data. Always take into account that the treatment will also be dependent on availability of resources and existing treatment guidelines, considered by other Pain Management  Practitioners as being common knowledge and practice, at the time of the intervention. For Medico-Legal purposes, it is also important to point out that variation in procedural techniques and pharmacological choices are the acceptable norm. The indications, contraindications, technique, and results of the above procedure should only be interpreted and judged by a Board-Certified Interventional Pain Specialist with extensive familiarity and expertise in the same exact procedure and  technique.

## 2023-07-15 NOTE — Patient Instructions (Signed)

## 2023-07-15 NOTE — Progress Notes (Signed)
 Safety precautions to be maintained throughout the outpatient stay will include: orient to surroundings, keep bed in low position, maintain call bell within reach at all times, provide assistance with transfer out of bed and ambulation.

## 2023-07-16 ENCOUNTER — Telehealth: Payer: Self-pay

## 2023-07-16 NOTE — Telephone Encounter (Signed)
Called PP. Denies any needs at this time. Instructed to call if needed. 

## 2023-07-28 ENCOUNTER — Ambulatory Visit: Payer: PRIVATE HEALTH INSURANCE | Admitting: Internal Medicine

## 2023-07-29 ENCOUNTER — Ambulatory Visit: Payer: Medicare Other | Attending: Pain Medicine | Admitting: Pain Medicine

## 2023-07-29 ENCOUNTER — Encounter: Payer: Self-pay | Admitting: Pain Medicine

## 2023-07-29 VITALS — BP 156/63 | HR 80 | Temp 97.3°F | Resp 16 | Ht 60.0 in | Wt 132.0 lb

## 2023-07-29 DIAGNOSIS — M79604 Pain in right leg: Secondary | ICD-10-CM | POA: Insufficient documentation

## 2023-07-29 DIAGNOSIS — G894 Chronic pain syndrome: Secondary | ICD-10-CM

## 2023-07-29 DIAGNOSIS — M25551 Pain in right hip: Secondary | ICD-10-CM | POA: Diagnosis present

## 2023-07-29 DIAGNOSIS — G8929 Other chronic pain: Secondary | ICD-10-CM

## 2023-07-29 DIAGNOSIS — M79605 Pain in left leg: Secondary | ICD-10-CM | POA: Diagnosis present

## 2023-07-29 DIAGNOSIS — M542 Cervicalgia: Secondary | ICD-10-CM | POA: Diagnosis present

## 2023-07-29 DIAGNOSIS — T85848S Pain due to other internal prosthetic devices, implants and grafts, sequela: Secondary | ICD-10-CM

## 2023-07-29 DIAGNOSIS — M545 Low back pain, unspecified: Secondary | ICD-10-CM | POA: Insufficient documentation

## 2023-07-29 DIAGNOSIS — M47816 Spondylosis without myelopathy or radiculopathy, lumbar region: Secondary | ICD-10-CM

## 2023-07-29 DIAGNOSIS — M7062 Trochanteric bursitis, left hip: Secondary | ICD-10-CM | POA: Insufficient documentation

## 2023-07-29 DIAGNOSIS — Z79891 Long term (current) use of opiate analgesic: Secondary | ICD-10-CM

## 2023-07-29 DIAGNOSIS — M7061 Trochanteric bursitis, right hip: Secondary | ICD-10-CM

## 2023-07-29 DIAGNOSIS — M533 Sacrococcygeal disorders, not elsewhere classified: Secondary | ICD-10-CM | POA: Diagnosis present

## 2023-07-29 DIAGNOSIS — Z79899 Other long term (current) drug therapy: Secondary | ICD-10-CM

## 2023-07-29 DIAGNOSIS — M5459 Other low back pain: Secondary | ICD-10-CM | POA: Diagnosis present

## 2023-07-29 DIAGNOSIS — M961 Postlaminectomy syndrome, not elsewhere classified: Secondary | ICD-10-CM

## 2023-07-29 DIAGNOSIS — M25552 Pain in left hip: Secondary | ICD-10-CM | POA: Insufficient documentation

## 2023-07-29 MED ORDER — OXYCODONE-ACETAMINOPHEN 7.5-325 MG PO TABS: 1.0000 | ORAL_TABLET | Freq: Every day | ORAL | 0 refills | Status: DC

## 2023-07-29 NOTE — Patient Instructions (Signed)

## 2023-07-29 NOTE — Addendum Note (Signed)
Addended by: Delano Metz A on: 07/29/2023 02:16 PM   Modules accepted: Orders

## 2023-07-29 NOTE — Progress Notes (Signed)
Nursing Pain Medication Assessment:  Safety precautions to be maintained throughout the outpatient stay will include: orient to surroundings, keep bed in low position, maintain call bell within reach at all times, provide assistance with transfer out of bed and ambulation.  Medication Inspection Compliance: Pill count conducted under aseptic conditions, in front of the patient. Neither the pills nor the bottle was removed from the patient's sight at any time. Once count was completed pills were immediately returned to the patient in their original bottle.  Medication: Oxycodone/APAP Pill/Patch Count:46/150 Pill/Patch Appearance: Markings consistent with prescribed medication Bottle Appearance: Standard pharmacy container. Clearly labeled. Filled Date: 01 / 08 / 2025 Last Medication intake:  Today

## 2023-07-29 NOTE — Progress Notes (Addendum)
PROVIDER NOTE: Information contained herein reflects review and annotations entered in association with encounter. Interpretation of such information and data should be left to medically-trained personnel. Information provided to patient can be located elsewhere in the medical record under "Patient Instructions". Document created using STT-dictation technology, any transcriptional errors that may result from process are unintentional.    Patient: Jacqueline Thornton  Service Category: E/M  Provider: Oswaldo Done, MD  DOB: [birth year]  DOS: 07/29/2023  Referring Provider: Margaretann Loveless, MD  MRN: [id number]  Specialty: Interventional Pain Management  PCP: Jacqueline Loveless, MD  Type: Established Patient  Setting: Ambulatory outpatient    Location: Office  Delivery: Face-to-face     HPI  [name], a 73 y.o. year old female, is here today because of her Chronic bilateral low back pain without sciatica [M54.50, G89.29]. [name]'s primary complain today is Back Pain  Pertinent problems: [name] has Chronic pain syndrome; Headache, migraine; Chronic low back pain (Bilateral) (R>L) w/o sciatica; Spondylosis of lumbar spine; Lumbar annular disc tear (L4-5); Discogenic low back pain (L3-4 and L4-5); Lumbar facet hypertrophy; Lumbar facet syndrome (Bilateral) (R>L); Chronic neck pain (Left); Cervical spondylosis; Hx of cervical spine surgery; Cervical spinal fusion (C6-7 interbody fusion); Chronic sacroiliac joint pain (Bilateral) (L>R); Chronic lumbar radicular pain (Left); Trochanteric bursitis of hip (Right); Chronic hip pain (Bilateral) (L>R); Neurogenic pain; Myofascial pain; Osteoarthritis of hip (Left); Arthrodesis status; DDD (degenerative disc disease), lumbar; Myalgia; Trochanteric bursitis of hip (Bilateral) (L>R); Spondylosis without myelopathy or radiculopathy, lumbosacral region; Spondylosis without myelopathy or radiculopathy, sacral and sacrococcygeal region; Other specified  dorsopathies, sacral and sacrococcygeal region; Age-related osteoporosis without current pathological fracture; Bone island of right femur; Inflammatory spondylopathy of lumbar region [facility]); Trigger point with back pain (Right); Fibromyalgia; DDD (degenerative disc disease), cervical; Spondylolisthesis of lumbar region; History of lumbar spinal fusion (Dr. Maeola Thornton) (12/11/2018); Failed back surgical syndrome; Unilateral occipital headache (Left); Cervicogenic headache (Left); Chronic hip pain (Left); Spondylolisthesis at L5-S1 level; Spondylosis of lumbosacral region without myelopathy or radiculopathy; Epidural fibrosis; Enthesopathy of hip region (Left); Chronic sacroiliac joint pain (Left); Enthesopathy of sacroiliac joint (Left); Somatic dysfunction of sacroiliac joint (Left); Herniated cervical disc without myelopathy; Chronic sacroiliac joint pain (Right); Osteoarthritis of sacroiliac joint (Right) (HCC); Somatic dysfunction of sacroiliac joint (Right); Chronic hip pain (Right); Neck pain; Herniated nucleus pulposus, C3-4; Prolapsed cervical intervertebral disc; Cervical spinal stenosis; Radiculopathy, cervical region; Lumbar central spinal stenosis, 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 Pain Assessment: Severity of Chronic pain is reported as a 2 /10. Location: Back Lower/denies. Onset: More than a month ago. Quality: Aching. Timing: Constant. Modifying factor(s): meds, procedures. Vitals:  height is 5' (1.524 m) and  weight is 132 lb (59.9 kg). Her temperature is 97.3 F (36.3 C) (abnormal). Her blood pressure is 156/63 (abnormal) and her pulse is 80. Her respiration is 16 and oxygen saturation is 97%.  BMI: Estimated body mass index is 25.78 kg/m as calculated from the following:   Height as of this encounter: 5' (1.524 m).   Weight as of this encounter: 132 lb (59.9 kg). Last encounter:  07/09/2023. Last procedure: 07/15/2023.  Reason for encounter: both, medication management and post-procedure evaluation and assessment.  The patient indicates doing well with the current medication regimen. No adverse reactions or side effects reported to the medications.   Discussed the use of AI scribe software for clinical note transcription with the patient, who gave verbal consent to proceed.  History of Present Illness   The patient presents with ongoing back pain and leg pain.  She experiences ongoing back pain and leg pain, with the left side being more severe than the right. Pain is present in the calf of both legs, with the left side being 'two times worse' than the right.  She has previously received injections in the area of her hardware and facet, which provided temporary relief while the area was numb. She has also undergone physical therapy, back surgery, and has tried muscle relaxants.  A spinal cord stimulator was previously effective in managing her pain but was removed due to severe headaches attributed to neck issues. After removal, insurance did not approve re-implantation, and she continues to experience symptoms that were previously managed by the stimulator.  She is currently taking pain medication without any side effects or adverse reactions.     RTCB: 11/06/2023   Post-procedure evaluation   Type: Lumbar Fusion (Pedicle screw) Hardware Block (MBB) #2  Laterality: Bilateral  Level: L2, L3, L4, and L5 Medial Branch Level(s).  Imaging: Fluoroscopic guidance Spinal ([id number]) Anesthesia: Local  anesthesia (1-2% Lidocaine) Anxiolysis: IV Versed 2.0 mg Sedation: Moderate Sedation Fentanyl 2.0 mL (100 mcg) DOS: 07/15/2023 Performed by: Jacqueline Done, MD  Primary Purpose: Diagnostic/Therapeutic Indications: Low back pain severe enough to impact quality of life or function. 1. Chronic low back pain (Bilateral) (R>L) w/o sciatica   2. Pain from implanted hardware, sequela   3. Lumbar facet joint pain   4. Lumbar facet syndrome (Bilateral) (R>L)   5. Lumbar facet hypertrophy   6. Spondylosis without myelopathy or radiculopathy, lumbosacral region   7. Failed back surgical syndrome   8. Spondylolisthesis at L5-S1 level   9. Encounter for therapeutic procedure    NAS-11 Pain score:   Pre-procedure: 4 /10   Post-procedure: 0-No pain/10     Effectiveness:  Initial hour after procedure: 100 %. Subsequent 4-6 hours post-procedure: 100 %. Analgesia past initial 6 hours:  (right 75%  left 50%). Ongoing improvement:  Analgesic: The patient indicates having attained an ongoing 75% improvement on the right lower back with only a 50% improvement on the left side. Function: Somewhat improved ROM: Somewhat improved  Pharmacotherapy Assessment  Analgesic: Oxycodone/APAP 7.5/325, 1 tab PO q 5 times a day. (56.25 mg/day of oxycodone) MME/day: 56.25 mg/day.   Monitoring: [place] PMP: PDMP reviewed during this encounter.       Pharmacotherapy: No side-effects or adverse reactions reported. Compliance: No problems identified. Effectiveness: Clinically acceptable.  Valerie Salts, RN  07/29/2023  2:08 PM  Signed Nursing Pain Medication Assessment:  Safety precautions to be maintained throughout the outpatient stay will include: orient to surroundings, keep bed in low position, maintain call bell within reach at all times, provide assistance with transfer out of bed and ambulation.  Medication Inspection Compliance: Pill count conducted under aseptic conditions, in front of the patient.  Neither the pills nor the bottle was removed from the patient's sight at any time. Once count was completed pills were immediately returned to the patient in their original bottle.  Medication: Oxycodone/APAP Pill/Patch Count:46/150 Pill/Patch Appearance: Markings consistent with  prescribed medication Bottle Appearance: Standard pharmacy container. Clearly labeled. Filled Date: 01 / 08 / 2025 Last Medication intake:  Today    No results found for: "CBDTHCR" No results found for: "D8THCCBX" No results found for: "D9THCCBX"  UDS:  Summary  Date Value Ref Range Status  01/01/2023 Note  Final    Comment:    ==================================================================== [employer] (MW) ==================================================================== Test                             Result       Flag       Units  Drug Present and Declared for Prescription Verification   Oxycodone                      7012         EXPECTED   ng/mg creat   Oxymorphone                    2293         EXPECTED   ng/mg creat   Noroxycodone                   9132         EXPECTED   ng/mg creat   Noroxymorphone                 590          EXPECTED   ng/mg creat    Sources of oxycodone are scheduled prescription medications.    Oxymorphone, noroxycodone, and noroxymorphone are expected    metabolites of oxycodone. Oxymorphone is also available as a    scheduled prescription medication.  ==================================================================== Test                      Result    Flag   Units      Ref Range   Creatinine              41               mg/dL      >=16 ==================================================================== Declared Medications:  The flagging and interpretation on this report are based on the  following declared medications.  Unexpected results may arise from  inaccuracies in the declared medications.   **Note: The testing scope of this panel includes  these medications:   Oxycodone (Percocet)   **Note: The testing scope of this panel does not include the  following reported medications:   Acetaminophen (Percocet)  Atorvastatin (Lipitor)  Calcium  Cyanocobalamin  Docusate (Colace)  Duloxetine (Cymbalta)  Enalapril (Vasotec)  Estradiol (Estrace)  Furosemide (Lasix)  Gabapentin (Neurontin)  Magnesium  Melatonin  Methocarbamol (Robaxin)  Multivitamin  Naloxone (Narcan)  Omeprazole (Prilosec)  Spironolactone (Aldactone)  Supplement  Vitamin D ==================================================================== For clinical consultation, please call [phone]. ====================================================================       ROS  Constitutional: Denies any fever or chills Gastrointestinal: No reported hemesis, hematochezia, vomiting, or acute GI distress Musculoskeletal: Denies any acute onset joint swelling, redness, loss of ROM, or weakness Neurological: No reported episodes of acute onset apraxia, aphasia, dysarthria, agnosia, amnesia, paralysis, loss of coordination, or loss of consciousness  Medication Review  Calcium-Vitamin D-Vitamin K, Cyanocobalamin, DULoxetine, Magnesium, Melatonin, atorvastatin, docusate sodium, enalapril, estradiol, furosemide, gabapentin, methocarbamol, multivitamin with minerals, naloxone, omeprazole, oxyCODONE-acetaminophen, and spironolactone  History Review  Allergy: Ms. Biggs has no known allergies. Drug: Ms. Ask  reports no history of drug use. Alcohol:  reports no history of alcohol use. Tobacco:  reports that she quit smoking about 20 years ago. Her smoking use included cigarettes. She has never used smokeless tobacco. Social: Ms. Miao  reports that she quit smoking about 20 years ago. Her smoking use included cigarettes. She has never used smokeless tobacco. She reports that she does not drink alcohol and does not use drugs. Medical:  has a past medical history  of Absolute anemia (04/11/2015), Acute postoperative pain (08/07/2017), Angina pectoris (HCC), Anxiety, Arthritis, Arthropathy of sacroiliac joint (04/11/2015), Atypical face pain (04/24/2015), Back pain, CAD (coronary artery disease), Cancer (HCC), Chronic back pain, Constipation, Depression, Fibromyalgia, GERD (gastroesophageal reflux disease), 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) (04/11/2015), H/O: hysterectomy (1979), Heart murmur, Hyperlipidemia, Hypertension, Low back pain (04/06/2015), Lumbar radicular pain (04/18/2015), Migraine, Narrowing of intervertebral disc space (04/11/2015), Sacroiliac joint pain (04/11/2015), and Spine disorder. Surgical: Ms. Snader  has a past surgical history that includes Cholecystectomy; Abdominal hysterectomy; Neck surgery; Colonoscopy with propofol (N/A, 07/19/2015); caract surger; Laparoscopic salpingo oophorectomy (Bilateral, 03/03/2018); Cystoscopy (03/03/2018); Tonsillectomy; Appendectomy; Tumor removal; Anterior lumbar fusion (N/A, 12/11/2018); Abdominal exposure (N/A, 12/11/2018); Anterior cervical decomp/discectomy fusion (N/A, 10/06/2020); Toe Fusion (Right); Esophagogastroduodenoscopy (egd) with propofol (N/A, 09/11/2021); Colonoscopy with propofol (N/A, 09/11/2021); Colonoscopy with propofol (N/A, 03/05/2022); and Transforaminal lumbar interbody fusion w/ mis 1 level (Right, 04/30/2022). Family: family history includes Colon cancer in her maternal grandmother and mother; Diabetes in her father and mother; Hypertension in her sister; Stroke in her mother.  Laboratory Chemistry Profile   Renal Lab Results  Component Value Date   BUN 12 01/13/2023   CREATININE 1.00 01/13/2023   BCR 12 01/13/2023   GFRAA 47 (L) 12/16/2019   GFRNONAA >60 04/24/2022    Hepatic Lab Results  Component Value Date   AST 15 01/13/2023   ALT 11 01/13/2023   ALBUMIN 3.7 (L) 01/13/2023   ALKPHOS 52 01/13/2023    Electrolytes Lab Results  Component Value Date   NA 141  01/13/2023   K 4.4 01/13/2023   CL 103 01/13/2023   CALCIUM 9.1 01/13/2023   MG 2.4 (H) 12/16/2019    Bone Lab Results  Component Value Date   VD25OH 33.0 01/13/2023   25OHVITD1 53 12/16/2019   25OHVITD2 <1.0 12/16/2019   25OHVITD3 53 12/16/2019    Inflammation (CRP: Acute Phase) (ESR: Chronic Phase) Lab Results  Component Value Date   CRP 4 12/16/2019   ESRSEDRATE 6 12/16/2019         Note: Above Lab results reviewed.  Recent Imaging Review  DG PAIN CLINIC C-ARM 1-60 MIN NO REPORT Fluoro was used, but no Radiologist interpretation will be provided.  Please refer to "NOTES" tab for provider progress note. Note: Reviewed        Physical Exam  General appearance: Well nourished, well developed, and well hydrated. In no apparent acute distress Mental status: Alert, oriented x 3 (person, place, & time)       Respiratory: No evidence of acute respiratory distress Eyes: PERLA Vitals: BP (!) 156/63   Pulse 80   Temp (!) 97.3 F (36.3 C)   Resp 16   Ht 5' (1.524 m)   Wt 132 lb (59.9 kg)   SpO2 97%   BMI 25.78 kg/m  BMI: Estimated body mass index is 25.78 kg/m as calculated from the following:   Height as of this encounter: 5' (1.524 m).   Weight as of this encounter: 132 lb (59.9 kg). Ideal: Ideal  body weight: 45.5 kg (100 lb 4.9 oz) Adjusted ideal body weight: 51.3 kg (112 lb 15.8 oz)  Assessment   Diagnosis Status  1. Chronic low back pain (Bilateral) (R>L) w/o sciatica   2. Pain from implanted hardware, sequela   3. Lumbar facet joint pain   4. Chronic lower extremity pain (Bilateral) (R>L)   5. Chronic hip pain (Bilateral) (L>R)   6. Chronic sacroiliac joint pain (Bilateral) (L>R)   7. Chronic neck pain (Left)   8. Lumbar facet syndrome (Bilateral) (R>L)   9. 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 Chronic pain syndrome   12. Pharmacologic therapy   13. Chronic use of opiate for therapeutic purpose   14.  Encounter for medication management   15. Encounter for chronic pain management    Improved Improved Improved   Updated Problems: No problems updated.  Plan of Care  Problem-specific:  Assessment and Plan    Chronic Back Pain with Radiculopathy   Right-sided pain has improved following injections, but left-sided pain persists and is significantly worse. There is a history of back surgery, physical therapy, muscle relaxants, and pain medication use. A previous spinal cord stimulator provided significant relief but was removed due to severe headaches linked to cervical issues. Insurance did not approve re-implantation at that time. Current pain management includes medication without adverse effects. Discussed re-approving a spinal cord stimulator trial, noting that modern stimulators are MRI-compatible, addressing previous concerns. She consented to pursuing re-approval for the trial. Send refills for pain medication to the pharmacy for the next three months. Submit an order to insurance for approval of the spinal cord stimulator trial. Continue the current medication regimen.     This patient already had an MMPI 2 Thornton for the purpose of getting her first spinal cord stimulator implanted.  She had no psychological contraindications to the implant.  Ms. DAZANI NORBY has a current medication list which includes the following long-term medication(s): atorvastatin, viactiv calcium plus d, cyanocobalamin, duloxetine, enalapril, estradiol, furosemide, gabapentin, naloxone, oxycodone-acetaminophen, [START ON 08/08/2023] oxycodone-acetaminophen, [START ON 09/07/2023] oxycodone-acetaminophen, [START ON 10/07/2023] oxycodone-acetaminophen, spironolactone, and omeprazole.  Pharmacotherapy (Medications Ordered): Meds ordered this encounter  Medications   oxyCODONE-acetaminophen (PERCOCET) 7.5-325 MG tablet    Sig: Take 1 tablet by mouth 5 (five) times daily. Must last 30 days    Dispense:  150 tablet     Refill:  0    DO NOT: delete (not duplicate); no partial-fill (will deny script to complete), no refill request (F/U required). DISPENSE: 1 day early if closed on fill date. WARN: No CNS-depressants within 8 hrs of med.   oxyCODONE-acetaminophen (PERCOCET) 7.5-325 MG tablet    Sig: Take 1 tablet by mouth 5 (five) times daily. Must last 30 days    Dispense:  150 tablet    Refill:  0    DO NOT: delete (not duplicate); no partial-fill (will deny script to complete), no refill request (F/U required). DISPENSE: 1 day early if closed on fill date. WARN: No CNS-depressants within 8 hrs of med.   oxyCODONE-acetaminophen (PERCOCET) 7.5-325 MG tablet    Sig: Take 1 tablet by mouth 5 (five) times daily. Must last 30 days    Dispense:  150 tablet    Refill:  0    DO NOT: delete (not duplicate); no partial-fill (will deny script to complete), no refill request (F/U required). DISPENSE: 1 day early if closed on fill date. WARN: No CNS-depressants within  8 hrs of med.   Orders:  Orders Placed This Encounter  Procedures   [facility] TRIAL    Contact medical implant company representative to notify them of the scheduled case and to make sure they will be available to provide required equipment.    Standing Status:   Future    Expiration Date:   10/27/2023    Scheduling Instructions:     Side: Bilateral     Level: Lumbar     Device: Medtronic     Sedation: With sedation     Timeframe: As soon as pre-approved    Where will this procedure be performed?:   [facility]   Ambulatory referral to Psychology    Referral Priority:   Routine    Referral Type:   Psychiatric    Referral Reason:   Specialty Services Required    Requested Specialty:   Psychology    Number of Visits Requested:   1   Nursing Instructions:    Please complete this patient's postprocedure evaluation.    Scheduling Instructions:     Please complete this patient's postprocedure evaluation.   Follow-up plan:   Return in about 3  months (around 11/06/2023) for Eval-day (M,W), (F2F), (MM).      Interventional Therapies  Risk Factors  Considerations:     Lumbar HARDWARE (NO RFA)     Planned  Pending:   Diagnostic bilateral lumbar MBB (pedicle screw hardware inj.)  #1 (12/17/2022)  Diagnostic/therapeutic midline caudal ESI #2 + sacral interspinous ligament MNB/TPI #1    Under consideration:   Diagnostic lumbar SCS trial    Completed:   Diagnostic bilateral lumbar MBB (pedicle screw hardware inj.) x1 (12/17/2022) (100/100/100/R:75L:50)  Diagnostic bilateral L4 TFESI x1 (01/08/2022) (100/25/0)  Therapeutic right Racz procedure x1 (08/17/2020) (04/09/84/85) (100% relief of the right LEP) Diagnostic caudal ESI x1 (07/11/2020) (100/100/0/0)  Palliative left L5-S1 LESI x2 (07/27/2015) (70/70/0/0)  Diagnostic left lumbar facet MBB x7 (02/07/2022) (20/20/0/0)  Palliative right lumbar facet MBB x10 (02/07/2022) (20/20/0/0)  Therapeutic right shoulder joint inj. x1 (02/29/2016) (90/80/30)  Therapeutic left IA hip injection x3 (06/13/2020) (100/100/30/>50)  Diagnostic right SI joint Blk x3 (06/17/2017) (100/90/80/80)  Diagnostic/therapeutic left SI joint Blk x4 (06/17/2017) (100/90/80/80)  Palliative bilateral trochanteric bursa inj. x1 (12/11/2016) (100/100/90/90)  Therapeutic right lumbar facet RFA x2 (01/11/2021) (100/100/70)  Therapeutic left lumbar facet RFA x2 (12/19/2020) (100/100/75/>75) Therapeutic right SI joint RFA x1 (08/07/2017) (100/100/80)  Therapeutic left SI joint RFA x1 (09/16/2017) (100/100/100)    Therapeutic  Palliative (PRN) options:   Palliative Racz procedure  Palliative bilateral SI joint block  Palliative right trochanteric bursa injection    Pharmacotherapy  Nonopioids transferred 05/24/2020: Vitamin B12, Flexeril, and Neurontin.       Recent Visits Date Type Provider Dept  07/15/23 Procedure visit Delano Metz, MD [facility]  07/09/23 Office Visit Delano Metz, MD  [facility]  06/03/23 Office Visit Delano Metz, MD [facility]  Showing recent visits within past 90 days and meeting all other requirements Today's Visits Date Type Provider Dept  07/29/23 Office Visit Delano Metz, MD [facility]  Showing today's visits and meeting all other requirements Future Appointments No visits were found meeting these conditions. Showing future appointments within next 90 days and meeting all other requirements  I discussed the assessment and treatment plan with the patient. The patient was provided an opportunity to ask questions and all were answered. The patient agreed with the plan and demonstrated an understanding of  the instructions.  Patient advised to call back or seek an in-person evaluation if the symptoms or condition worsens.  Duration of encounter: 30 minutes.  Total time on encounter, as per AMA guidelines included both the face-to-face and non-face-to-face time personally spent by the physician and/or other qualified health care professional(s) on the day of the encounter (includes time in activities that require the physician or other qualified health care professional and does not include time in activities normally performed by clinical staff). Physician's time may include the following activities when performed: Preparing to see the patient (e.g., pre-charting review of records, searching for previously ordered imaging, lab work, and nerve conduction tests) Review of prior analgesic pharmacotherapies. Reviewing PMP Interpreting ordered tests (e.g., lab work, imaging, nerve conduction tests) Performing post-procedure evaluations, including interpretation of diagnostic procedures Obtaining and/or reviewing separately obtained history Performing a medically appropriate examination and/or evaluation Counseling and educating the patient/family/caregiver Ordering medications, tests, or procedures Referring and  communicating with other health care professionals (when not separately reported) Documenting clinical information in the electronic or other health record Independently interpreting results (not separately reported) and communicating results to the patient/ family/caregiver Care coordination (not separately reported)  Note by: Jacqueline Done, MD Date: 07/29/2023; Time: 2:16 PM

## 2023-07-30 ENCOUNTER — Other Ambulatory Visit: Payer: Self-pay | Admitting: Surgery

## 2023-07-30 DIAGNOSIS — M47812 Spondylosis without myelopathy or radiculopathy, cervical region: Secondary | ICD-10-CM

## 2023-08-01 ENCOUNTER — Ambulatory Visit: Payer: PRIVATE HEALTH INSURANCE | Admitting: Cardiovascular Disease

## 2023-08-05 ENCOUNTER — Ambulatory Visit: Payer: PRIVATE HEALTH INSURANCE | Admitting: Cardiovascular Disease

## 2023-08-06 ENCOUNTER — Encounter: Payer: Medicare Other | Admitting: Pain Medicine

## 2023-08-21 ENCOUNTER — Other Ambulatory Visit: Payer: Self-pay | Admitting: Internal Medicine

## 2023-08-28 ENCOUNTER — Telehealth: Payer: Self-pay

## 2023-08-28 NOTE — Telephone Encounter (Signed)
 I got her pysch eval back. IT looks good. Do you want me to proceed with the prior auth for the scs trial?

## 2023-09-05 ENCOUNTER — Encounter: Payer: Self-pay | Admitting: Cardiology

## 2023-09-05 ENCOUNTER — Ambulatory Visit: Admitting: Cardiology

## 2023-09-05 VITALS — BP 164/70 | HR 92 | Ht 60.0 in | Wt 120.0 lb

## 2023-09-05 DIAGNOSIS — E782 Mixed hyperlipidemia: Secondary | ICD-10-CM | POA: Diagnosis not present

## 2023-09-05 DIAGNOSIS — R634 Abnormal weight loss: Secondary | ICD-10-CM

## 2023-09-05 DIAGNOSIS — Z1329 Encounter for screening for other suspected endocrine disorder: Secondary | ICD-10-CM | POA: Diagnosis not present

## 2023-09-05 DIAGNOSIS — E559 Vitamin D deficiency, unspecified: Secondary | ICD-10-CM

## 2023-09-05 DIAGNOSIS — R739 Hyperglycemia, unspecified: Secondary | ICD-10-CM

## 2023-09-05 LAB — POCT URINALYSIS DIPSTICK
Bilirubin, UA: NEGATIVE
Blood, UA: NEGATIVE
Glucose, UA: NEGATIVE
Ketones, UA: NEGATIVE
Leukocytes, UA: NEGATIVE
Nitrite, UA: NEGATIVE
Protein, UA: NEGATIVE
Spec Grav, UA: 1.025 (ref 1.010–1.025)
Urobilinogen, UA: 0.2 U/dL
pH, UA: 6 (ref 5.0–8.0)

## 2023-09-05 MED ORDER — GABAPENTIN 300 MG PO CAPS
300.0000 mg | ORAL_CAPSULE | Freq: Three times a day (TID) | ORAL | 0 refills | Status: DC
Start: 2023-09-05 — End: 2023-12-29

## 2023-09-05 MED ORDER — ONDANSETRON 4 MG PO TBDP: 4.0000 mg | ORAL_TABLET | Freq: Three times a day (TID) | ORAL | 0 refills | Status: AC | PRN

## 2023-09-05 MED ORDER — METHOCARBAMOL 500 MG PO TABS: 500.0000 mg | ORAL_TABLET | Freq: Four times a day (QID) | ORAL | 0 refills | Status: DC

## 2023-09-05 NOTE — Progress Notes (Signed)
 Established Patient Office Visit  Subjective:  Patient ID: Jacqueline Thornton, female    DOB: [birth year]  Age: 73 y.o. MRN: [id number]  Chief Complaint  Patient presents with   Acute Visit    Nausea/ Losing Weight    Patient in office for an acute visit, complaining of nausea, vomiting and losing weight. Patient reports symptoms started one week ago. However, she states she has been having these episodes off and on for the past 6-7 months. Patient's weight 01/2023 137 lbs, today 120 lbs.  States she has not taken her medications since last Saturday due to not being able to keep food down. Reports she has been taking her spouse's anti-nausea medication with some relief.  Colonoscopy 03/2022 normal. UA today normal.  Patient due for fasting lab work, will return next week for fasting labs.     No other concerns at this time.   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   Atypical face pain 04/24/2015   Back pain    CAD (coronary artery disease)    pt denies   Cancer (HCC)    squamous cell- R temple    Chronic back pain    Constipation    Depression    Fibromyalgia    GERD (gastroesophageal reflux disease)    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) 04/11/2015   H/O: hysterectomy 1979   Heart murmur    Hyperlipidemia    Hypertension    Low back pain 04/06/2015   Lumbar radicular pain 04/18/2015   Migraine    Narrowing of intervertebral disc space 04/11/2015   Sacroiliac joint pain 04/11/2015   Spine disorder     Past Surgical History:  Procedure Laterality Date   ABDOMINAL EXPOSURE N/A 12/11/2018   Procedure: ABDOMINAL EXPOSURE;  Surgeon: Nada Libman, MD;  Location: [facility] OR;  Service: Vascular;  Laterality: N/A;   ABDOMINAL HYSTERECTOMY     ANTERIOR CERVICAL DECOMP/DISCECTOMY FUSION N/A 10/06/2020    Procedure: Cervical three -four Anterior cervical decompression/discectomy/fusion  with possible anchor c and exploration of fusion Cervical four to Six;  Surgeon: Maeola Harman, MD;  Location: [facility] OR;  Service: Neurosurgery;  Laterality: N/A;   ANTERIOR LUMBAR FUSION N/A 12/11/2018   Procedure: Lumbar five Sacral one Anterior lumbar interbody fusion;  Surgeon: Maeola Harman, MD;  Location: [facility] OR;  Service: Neurosurgery;  Laterality: N/A;   APPENDECTOMY     Age19   caract surger     02/24/17   CHOLECYSTECTOMY     COLONOSCOPY WITH PROPOFOL N/A 07/19/2015   Procedure: COLONOSCOPY WITH PROPOFOL;  Surgeon: Scot Jun, MD;  Location: [facility] ENDOSCOPY;  Service: Endoscopy;  Laterality: N/A;   COLONOSCOPY WITH PROPOFOL N/A 09/11/2021   Procedure: COLONOSCOPY WITH PROPOFOL;  Surgeon: Regis Bill, MD;  Location: [facility] ENDOSCOPY;  Service: Endoscopy;  Laterality: N/A;   COLONOSCOPY WITH PROPOFOL N/A 03/05/2022   Procedure: COLONOSCOPY WITH PROPOFOL;  Surgeon: Regis Bill, MD;  Location: [facility] ENDOSCOPY;  Service: Endoscopy;  Laterality: N/A;   CYSTOSCOPY  03/03/2018   Procedure: CYSTOSCOPY;  Surgeon: Conard Novak, MD;  Location: [facility] ORS;  Service: Gynecology;;   ESOPHAGOGASTRODUODENOSCOPY (EGD) WITH PROPOFOL N/A 09/11/2021   Procedure: ESOPHAGOGASTRODUODENOSCOPY (EGD) WITH PROPOFOL;  Surgeon: Regis Bill, MD;  Location: [facility] ENDOSCOPY;  Service: Endoscopy;  Laterality: N/A;   LAPAROSCOPIC SALPINGO OOPHERECTOMY Bilateral 03/03/2018   Procedure: LAPAROSCOPIC SALPINGO OOPHORECTOMY;  Surgeon: Conard Novak, MD;  Location: [facility] ORS;  Service: Gynecology;  Laterality: Bilateral;   NECK SURGERY     Age 35 and 40.   TOE FUSION Right    TONSILLECTOMY     Age 66   TRANSFORAMINAL LUMBAR INTERBODY FUSION W/ MIS 1 LEVEL Right 04/30/2022   Procedure: Minimally Invasive  Decompression and  Transforaminal Lumbar Interbody Fusion Lumbar Four-Five;  Surgeon: Dawley, Alan Mulder, DO;   Location: [facility] OR;  Service: Neurosurgery;  Laterality: Right;  3C   TUMOR REMOVAL     Ovaries    Social History   Socioeconomic History   Marital status: Married    Spouse name: Not on file   Number of children: Not on file   Years of education: Not on file   Highest education level: Not on file  Occupational History   Not on file  Tobacco Use   Smoking status: Former    Current packs/day: 0.00    Types: Cigarettes    Quit date: 07/02/2003    Years since quitting: 20.1   Smokeless tobacco: Never  Vaping Use   Vaping status: Never Used  Substance and Sexual Activity   Alcohol use: No    Alcohol/week: 0.0 standard drinks of alcohol   Drug use: No   Sexual activity: Not Currently  Other Topics Concern   Not on file  Social History Narrative   Not on file   Social Drivers of Health   Financial Resource Strain: Low Risk  (06/17/2017)   Overall Financial Resource Strain (CARDIA)    Difficulty of Paying Living Expenses: Not hard at all  Food Insecurity: No Food Insecurity (02/28/2022)   Hunger Vital Sign    Worried About Running Out of Food in the Last Year: Never true    Ran Out of Food in the Last Year: Never true  Transportation Needs: No Transportation Needs (02/28/2022)   PRAPARE - Administrator, Civil Service (Medical): No    Lack of Transportation (Non-Medical): No  Physical Activity: Unknown (06/17/2017)   Exercise Vital Sign    Days of Exercise per Week: Patient declined    Minutes of Exercise per Session: Patient declined  Stress: No Stress Concern Present (06/17/2017)   [employer] of Occupational Health - Occupational Stress Questionnaire    Feeling of Stress : Not at all  Social Connections: Unknown (06/17/2017)   Social Connection and Isolation Panel [NHANES]    Frequency of Communication with Friends and Family: Patient declined    Frequency of Social Gatherings with Friends and Family: Patient declined    Attends Religious Services:  Patient declined    Database administrator or Organizations: Patient declined    Attends Banker Meetings: Patient declined    Marital Status: Patient declined  Intimate Partner Violence: Unknown (06/17/2017)   Humiliation, Afraid, Rape, and Kick questionnaire    Fear of Current or Ex-Partner: Patient declined    Emotionally Abused: Patient declined    Physically Abused: Patient declined    Sexually Abused: Patient declined    Family History  Problem Relation Age of Onset   Diabetes Father    Hypertension Sister    Diabetes Mother    Stroke Mother    Colon cancer Mother    Colon cancer Maternal Grandmother    Ovarian cancer Neg Hx    Breast cancer Neg Hx  No Known Allergies  Outpatient Medications Prior to Visit  Medication Sig   atorvastatin (LIPITOR) 20 MG tablet Take 20 mg by mouth daily. At night   Calcium-Vitamin D-Vitamin K (VIACTIV CALCIUM PLUS D) 650-12.5-40 MG-MCG-MCG CHEW Chew 1 tablet by mouth in the morning and at bedtime. Morning & supper   Cyanocobalamin (VITAMIN B12 PO) Take 1 tablet by mouth in the morning. vitafusion B12 vitamins   docusate sodium (COLACE) 100 MG capsule Take 100 mg by mouth 2 (two) times daily.   DULoxetine (CYMBALTA) 60 MG capsule Take 1 capsule by mouth once daily   enalapril (VASOTEC) 5 MG tablet Take 1 tablet by mouth once daily   estradiol (ESTRACE) 0.5 MG tablet Take 1 tablet by mouth once daily   furosemide (LASIX) 20 MG tablet Take 20 mg by mouth daily with lunch.   Magnesium 200 MG TABS Take 1 tablet by mouth at bedtime.   Melatonin 10 MG TABS Take 10 mg by mouth at bedtime.   Multiple Vitamin (MULTIVITAMIN WITH MINERALS) TABS tablet Take 1 tablet by mouth daily after supper. Women's Multivitamin (Gummy)   naloxone (NARCAN) nasal spray 4 mg/0.1 mL Place 1 spray into the nose as needed for up to 365 doses (for opioid-induced respiratory depresssion). In case of emergency (overdose), spray once into each nostril. If no  response within 3 minutes, repeat application and call 911.   omeprazole (PRILOSEC) 40 MG capsule Take 40 mg by mouth daily before breakfast.   oxyCODONE-acetaminophen (PERCOCET) 7.5-325 MG tablet Take 1 tablet by mouth 5 (five) times daily. Must last 30 days   oxyCODONE-acetaminophen (PERCOCET) 7.5-325 MG tablet Take 1 tablet by mouth 5 (five) times daily. Must last 30 days   [START ON 09/07/2023] oxyCODONE-acetaminophen (PERCOCET) 7.5-325 MG tablet Take 1 tablet by mouth 5 (five) times daily. Must last 30 days   [START ON 10/07/2023] oxyCODONE-acetaminophen (PERCOCET) 7.5-325 MG tablet Take 1 tablet by mouth 5 (five) times daily. Must last 30 days   spironolactone (ALDACTONE) 25 MG tablet TAKE ONE-HALF TABLET BY MOUTH ONCE DAILY AS DIRECTED   [DISCONTINUED] gabapentin (NEURONTIN) 300 MG capsule TAKE 1 CAPSULE BY MOUTH THREE TIMES DAILY   [DISCONTINUED] methocarbamol (ROBAXIN) 500 MG tablet Take 1 tablet (500 mg total) by mouth 4 (four) times daily.   No facility-administered medications prior to visit.    Review of Systems  Constitutional: Negative.   HENT: Negative.    Eyes: Negative.   Respiratory: Negative.  Negative for shortness of breath.   Cardiovascular: Negative.  Negative for chest pain.  Gastrointestinal:  Positive for nausea and vomiting. Negative for abdominal pain, constipation and diarrhea.  Genitourinary: Negative.   Musculoskeletal:  Negative for joint pain and myalgias.  Skin: Negative.   Neurological: Negative.  Negative for dizziness and headaches.  Endo/Heme/Allergies: Negative.   All other systems reviewed and are negative.      Objective:   BP (!) 164/70   Pulse 92   Ht 5' (1.524 m)   Wt 120 lb (54.4 kg)   SpO2 97%   BMI 23.44 kg/m   Vitals:   09/05/23 1058  BP: (!) 164/70  Pulse: 92  Height: 5' (1.524 m)  Weight: 120 lb (54.4 kg)  SpO2: 97%  BMI (Calculated): 23.44    Physical Exam Vitals and nursing note reviewed.  Constitutional:       Appearance: Normal appearance. She is normal weight.  HENT:     Head: Normocephalic and atraumatic.     Nose: Nose normal.  Mouth/Throat:     Mouth: Mucous membranes are moist.  Eyes:     Extraocular Movements: Extraocular movements intact.     Conjunctiva/sclera: Conjunctivae normal.     Pupils: Pupils are equal, round, and reactive to light.  Cardiovascular:     Rate and Rhythm: Normal rate and regular rhythm.     Pulses: Normal pulses.     Heart sounds: Normal heart sounds.  Pulmonary:     Effort: Pulmonary effort is normal.     Breath sounds: Normal breath sounds.  Abdominal:     General: Abdomen is flat. Bowel sounds are normal.     Palpations: Abdomen is soft.  Musculoskeletal:        General: Normal range of motion.     Cervical back: Normal range of motion.  Skin:    General: Skin is warm and dry.  Neurological:     General: No focal deficit present.     Mental Status: She is alert and oriented to person, place, and time.  Psychiatric:        Mood and Affect: Mood normal.        Behavior: Behavior normal.        Thought Content: Thought content normal.        Judgment: Judgment normal.      Results for orders placed or performed in visit on 09/05/23  POCT Urinalysis Dipstick (81002)  Result Value Ref Range   Color, UA     Clarity, UA     Glucose, UA Negative Negative   Bilirubin, UA Negative    Ketones, UA Negative    Spec Grav, UA 1.025 1.010 - 1.025   Blood, UA Negative    pH, UA 6.0 5.0 - 8.0   Protein, UA Negative Negative   Urobilinogen, UA 0.2 0.2 or 1.0 E.U./dL   Nitrite, UA Negative    Leukocytes, UA Negative Negative   Appearance     Odor      Recent Results (from the past 2160 hours)  POCT Urinalysis Dipstick (16109)     Status: Normal   Collection Time: 09/05/23 11:38 AM  Result Value Ref Range   Color, UA     Clarity, UA     Glucose, UA Negative Negative   Bilirubin, UA Negative    Ketones, UA Negative    Spec Grav, UA 1.025  1.010 - 1.025   Blood, UA Negative    pH, UA 6.0 5.0 - 8.0   Protein, UA Negative Negative   Urobilinogen, UA 0.2 0.2 or 1.0 E.U./dL   Nitrite, UA Negative    Leukocytes, UA Negative Negative   Appearance     Odor        Assessment & Plan:  Zofran for nausea Return next week for fasting lab work Follow up with regular provider in 2 weeks  Problem List Items Addressed This Visit       Other   Hyperlipidemia - Primary   Relevant Orders   Lipid panel   Hyperglycemia   Relevant Orders   CMP14+EGFR   Hemoglobin A1c   Vitamin D deficiency   Relevant Orders   VITAMIN D 25 Hydroxy (Vit-D Deficiency, Fractures)   Other Visit Diagnoses       Thyroid disorder screening       Relevant Orders   TSH     Unexplained weight loss       Relevant Orders   CBC with Differential/Platelet   POCT Urinalysis Dipstick (60454) (Completed)  Return in about 2 weeks (around 09/19/2023).   Total time spent: 25 minutes  [employer], NP  09/05/2023   This document may have been prepared by Dragon Voice Recognition software and as such may include unintentional dictation errors.

## 2023-09-16 ENCOUNTER — Other Ambulatory Visit

## 2023-09-16 DIAGNOSIS — E782 Mixed hyperlipidemia: Secondary | ICD-10-CM

## 2023-09-16 DIAGNOSIS — R739 Hyperglycemia, unspecified: Secondary | ICD-10-CM

## 2023-09-16 DIAGNOSIS — E559 Vitamin D deficiency, unspecified: Secondary | ICD-10-CM

## 2023-09-16 DIAGNOSIS — Z1329 Encounter for screening for other suspected endocrine disorder: Secondary | ICD-10-CM

## 2023-09-16 DIAGNOSIS — R634 Abnormal weight loss: Secondary | ICD-10-CM

## 2023-09-17 LAB — CBC WITH DIFFERENTIAL/PLATELET
Basophils Absolute: 0 10*3/uL (ref 0.0–0.2)
Basos: 0 %
EOS (ABSOLUTE): 0.1 10*3/uL (ref 0.0–0.4)
Eos: 2 %
Hematocrit: 38 % (ref 34.0–46.6)
Hemoglobin: 12.5 g/dL (ref 11.1–15.9)
Immature Grans (Abs): 0 10*3/uL (ref 0.0–0.1)
Immature Granulocytes: 0 %
Lymphocytes Absolute: 2 10*3/uL (ref 0.7–3.1)
Lymphs: 43 %
MCH: 33.4 pg — ABNORMAL HIGH (ref 26.6–33.0)
MCHC: 32.9 g/dL (ref 31.5–35.7)
MCV: 102 fL — ABNORMAL HIGH (ref 79–97)
Monocytes Absolute: 0.4 10*3/uL (ref 0.1–0.9)
Monocytes: 9 %
Neutrophils Absolute: 2.1 10*3/uL (ref 1.4–7.0)
Neutrophils: 46 %
Platelets: 194 10*3/uL (ref 150–450)
RBC: 3.74 x10E6/uL — ABNORMAL LOW (ref 3.77–5.28)
RDW: 12.6 % (ref 11.7–15.4)
WBC: 4.6 10*3/uL (ref 3.4–10.8)

## 2023-09-17 LAB — LIPID PANEL
Chol/HDL Ratio: 2.6 ratio (ref 0.0–4.4)
Cholesterol, Total: 132 mg/dL (ref 100–199)
HDL: 50 mg/dL (ref >39)
LDL Chol Calc (NIH): 65 mg/dL (ref 0–99)
Triglycerides: 89 mg/dL (ref 0–149)
VLDL Cholesterol Cal: 17 mg/dL (ref 5–40)

## 2023-09-17 LAB — CMP14+EGFR
ALT: 9 IU/L (ref 0–32)
AST: 15 IU/L (ref 0–40)
Albumin: 3.4 g/dL — ABNORMAL LOW (ref 3.8–4.8)
Alkaline Phosphatase: 56 IU/L (ref 44–121)
BUN/Creatinine Ratio: 10 — ABNORMAL LOW (ref 12–28)
BUN: 8 mg/dL (ref 8–27)
Bilirubin Total: 0.3 mg/dL (ref 0.0–1.2)
CO2: 27 mmol/L (ref 20–29)
Calcium: 9 mg/dL (ref 8.7–10.3)
Chloride: 104 mmol/L (ref 96–106)
Creatinine, Ser: 0.84 mg/dL (ref 0.57–1.00)
Globulin, Total: 1.6 g/dL (ref 1.5–4.5)
Glucose: 84 mg/dL (ref 70–99)
Potassium: 4.1 mmol/L (ref 3.5–5.2)
Sodium: 142 mmol/L (ref 134–144)
Total Protein: 5 g/dL — ABNORMAL LOW (ref 6.0–8.5)
eGFR: 74 mL/min/{1.73_m2} (ref >59)

## 2023-09-17 LAB — HEMOGLOBIN A1C
Est. average glucose Bld gHb Est-mCnc: 108 mg/dL
Hgb A1c MFr Bld: 5.4 % (ref 4.8–5.6)

## 2023-09-17 LAB — TSH: TSH: 2 u[IU]/mL (ref 0.450–4.500)

## 2023-09-17 LAB — VITAMIN D 25 HYDROXY (VIT D DEFICIENCY, FRACTURES): Vit D, 25-Hydroxy: 33.3 ng/mL (ref 30.0–100.0)

## 2023-09-19 ENCOUNTER — Ambulatory Visit: Payer: PRIVATE HEALTH INSURANCE | Admitting: Cardiology

## 2023-09-19 ENCOUNTER — Ambulatory Visit: Payer: PRIVATE HEALTH INSURANCE | Admitting: Internal Medicine

## 2023-09-19 ENCOUNTER — Other Ambulatory Visit

## 2023-09-22 ENCOUNTER — Ambulatory Visit: Payer: PRIVATE HEALTH INSURANCE | Admitting: Internal Medicine

## 2023-09-29 ENCOUNTER — Ambulatory Visit (INDEPENDENT_AMBULATORY_CARE_PROVIDER_SITE_OTHER): Payer: PRIVATE HEALTH INSURANCE | Admitting: Internal Medicine

## 2023-09-29 ENCOUNTER — Encounter: Payer: Self-pay | Admitting: Internal Medicine

## 2023-09-29 VITALS — BP 118/60 | HR 78 | Ht 60.0 in | Wt 125.0 lb

## 2023-09-29 DIAGNOSIS — I10 Essential (primary) hypertension: Secondary | ICD-10-CM | POA: Diagnosis not present

## 2023-09-29 DIAGNOSIS — R739 Hyperglycemia, unspecified: Secondary | ICD-10-CM

## 2023-09-29 DIAGNOSIS — E782 Mixed hyperlipidemia: Secondary | ICD-10-CM

## 2023-09-29 DIAGNOSIS — F411 Generalized anxiety disorder: Secondary | ICD-10-CM | POA: Diagnosis not present

## 2023-09-29 DIAGNOSIS — G894 Chronic pain syndrome: Secondary | ICD-10-CM

## 2023-09-29 DIAGNOSIS — Z1231 Encounter for screening mammogram for malignant neoplasm of breast: Secondary | ICD-10-CM

## 2023-09-29 NOTE — Progress Notes (Signed)
 Established Patient Office Visit  Subjective:  Patient ID: Jacqueline Thornton, female    DOB: 1950-10-21  Age: 73 y.o. MRN: [id number]  Chief Complaint  Patient presents with   Follow-up    2 week follow up    Patient comes in for her follow-up today.  She is feeling much better now.  Her GI symptoms have almost completely resolved and she is able to eat, does gain some weight.  She is tolerating all her medications but is concerned about anxiety.  Thinks that her Cymbalta is not enough.  Patient has been under care of psychiatrist in the past, agrees to another consultation.  Will set up her appointment with the [facility].  Meanwhile we will continue taking her medications as such.  Her labs were done and the results discussed. Needs to be scheduled for mammogram.    No other concerns at this time.   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   Atypical face pain 04/24/2015   Back pain    CAD (coronary artery disease)    pt denies   Cancer (HCC)    squamous cell- R temple    Chronic back pain    Constipation    Depression    Fibromyalgia    GERD (gastroesophageal reflux disease)    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) 04/11/2015   H/O: hysterectomy 1979   Heart murmur    Hyperlipidemia    Hypertension    Low back pain 04/06/2015   Lumbar radicular pain 04/18/2015   Migraine    Narrowing of intervertebral disc space 04/11/2015   Sacroiliac joint pain 04/11/2015   Spine disorder     Past Surgical History:  Procedure Laterality Date   ABDOMINAL EXPOSURE N/A 12/11/2018   Procedure: ABDOMINAL EXPOSURE;  Surgeon: Nada Libman, MD;  Location: [facility] OR;  Service: Vascular;  Laterality: N/A;   ABDOMINAL HYSTERECTOMY     ANTERIOR CERVICAL DECOMP/DISCECTOMY FUSION N/A 10/06/2020   Procedure: Cervical three  -four Anterior cervical decompression/discectomy/fusion  with possible anchor c and exploration of fusion Cervical four to Six;  Surgeon: Maeola Harman, MD;  Location: [facility] OR;  Service: Neurosurgery;  Laterality: N/A;   ANTERIOR LUMBAR FUSION N/A 12/11/2018   Procedure: Lumbar five Sacral one Anterior lumbar interbody fusion;  Surgeon: Maeola Harman, MD;  Location: [facility] OR;  Service: Neurosurgery;  Laterality: N/A;   APPENDECTOMY     Age19   caract surger     02/24/17   CHOLECYSTECTOMY     COLONOSCOPY WITH PROPOFOL N/A 07/19/2015   Procedure: COLONOSCOPY WITH PROPOFOL;  Surgeon: Scot Jun, MD;  Location: [facility] ENDOSCOPY;  Service: Endoscopy;  Laterality: N/A;   COLONOSCOPY WITH PROPOFOL N/A 09/11/2021   Procedure: COLONOSCOPY WITH PROPOFOL;  Surgeon: Regis Bill, MD;  Location: [facility] ENDOSCOPY;  Service: Endoscopy;  Laterality: N/A;   COLONOSCOPY WITH PROPOFOL N/A 03/05/2022   Procedure: COLONOSCOPY WITH PROPOFOL;  Surgeon: Regis Bill, MD;  Location: [facility] ENDOSCOPY;  Service: Endoscopy;  Laterality: N/A;   CYSTOSCOPY  03/03/2018   Procedure: CYSTOSCOPY;  Surgeon: Conard Novak, MD;  Location: [facility] ORS;  Service: Gynecology;;   ESOPHAGOGASTRODUODENOSCOPY (EGD) WITH PROPOFOL N/A 09/11/2021   Procedure: ESOPHAGOGASTRODUODENOSCOPY (EGD) WITH PROPOFOL;  Surgeon: Regis Bill, MD;  Location: [facility] ENDOSCOPY;  Service: Endoscopy;  Laterality: N/A;   LAPAROSCOPIC SALPINGO OOPHERECTOMY Bilateral 03/03/2018   Procedure: LAPAROSCOPIC SALPINGO OOPHORECTOMY;  Surgeon: Conard Novak, MD;  Location: [facility] ORS;  Service: Gynecology;  Laterality: Bilateral;   NECK SURGERY     Age 8 and 24.   TOE FUSION Right    TONSILLECTOMY     Age 58   TRANSFORAMINAL LUMBAR INTERBODY FUSION W/ MIS 1 LEVEL Right 04/30/2022   Procedure: Minimally Invasive  Decompression and  Transforaminal Lumbar Interbody Fusion Lumbar Four-Five;  Surgeon: Dawley, Alan Mulder, DO;  Location: [facility] OR;  Service:  Neurosurgery;  Laterality: Right;  3C   TUMOR REMOVAL     Ovaries    Social History   Socioeconomic History   Marital status: Married    Spouse name: Not on file   Number of children: Not on file   Years of education: Not on file   Highest education level: Not on file  Occupational History   Not on file  Tobacco Use   Smoking status: Former    Current packs/day: 0.00    Types: Cigarettes    Quit date: 07/02/2003    Years since quitting: 20.2   Smokeless tobacco: Never  Vaping Use   Vaping status: Never Used  Substance and Sexual Activity   Alcohol use: No    Alcohol/week: 0.0 standard drinks of alcohol   Drug use: No   Sexual activity: Not Currently  Other Topics Concern   Not on file  Social History Narrative   Not on file   Social Drivers of Health   Financial Resource Strain: Low Risk  (06/17/2017)   Overall Financial Resource Strain (CARDIA)    Difficulty of Paying Living Expenses: Not hard at all  Food Insecurity: No Food Insecurity (02/28/2022)   Hunger Vital Sign    Worried About Running Out of Food in the Last Year: Never true    Ran Out of Food in the Last Year: Never true  Transportation Needs: No Transportation Needs (02/28/2022)   PRAPARE - Administrator, Civil Service (Medical): No    Lack of Transportation (Non-Medical): No  Physical Activity: Unknown (06/17/2017)   Exercise Vital Sign    Days of Exercise per Week: Patient declined    Minutes of Exercise per Session: Patient declined  Stress: No Stress Concern Present (06/17/2017)   [employer] of Occupational Health - Occupational Stress Questionnaire    Feeling of Stress : Not at all  Social Connections: Unknown (06/17/2017)   Social Connection and Isolation Panel [NHANES]    Frequency of Communication with Friends and Family: Patient declined    Frequency of Social Gatherings with Friends and Family: Patient declined    Attends Religious Services: Patient declined    Automotive engineer or Organizations: Patient declined    Attends Banker Meetings: Patient declined    Marital Status: Patient declined  Intimate Partner Violence: Unknown (06/17/2017)   Humiliation, Afraid, Rape, and Kick questionnaire    Fear of Current or Ex-Partner: Patient declined    Emotionally Abused: Patient declined    Physically Abused: Patient declined    Sexually Abused: Patient declined    Family History  Problem Relation Age of Onset   Diabetes Father    Hypertension Sister    Diabetes Mother    Stroke Mother    Colon cancer Mother    Colon cancer Maternal Grandmother    Ovarian cancer Neg Hx    Breast cancer Neg Hx  No Known Allergies  Outpatient Medications Prior to Visit  Medication Sig   atorvastatin (LIPITOR) 20 MG tablet Take 20 mg by mouth daily. At night   Calcium-Vitamin D-Vitamin K (VIACTIV CALCIUM PLUS D) 650-12.5-40 MG-MCG-MCG CHEW Chew 1 tablet by mouth in the morning and at bedtime. Morning & supper   docusate sodium (COLACE) 100 MG capsule Take 100 mg by mouth 2 (two) times daily.   DULoxetine (CYMBALTA) 60 MG capsule Take 1 capsule by mouth once daily   enalapril (VASOTEC) 5 MG tablet Take 1 tablet by mouth once daily   estradiol (ESTRACE) 0.5 MG tablet Take 1 tablet by mouth once daily   furosemide (LASIX) 20 MG tablet Take 20 mg by mouth daily with lunch.   gabapentin (NEURONTIN) 300 MG capsule Take 1 capsule (300 mg total) by mouth 3 (three) times daily.   Magnesium 200 MG TABS Take 1 tablet by mouth at bedtime.   Melatonin 10 MG TABS Take 10 mg by mouth at bedtime.   methocarbamol (ROBAXIN) 500 MG tablet Take 1 tablet (500 mg total) by mouth 4 (four) times daily.   Multiple Vitamin (MULTIVITAMIN WITH MINERALS) TABS tablet Take 1 tablet by mouth daily after supper. Women's Multivitamin (Gummy)   omeprazole (PRILOSEC) 40 MG capsule Take 40 mg by mouth daily before breakfast.   ondansetron (ZOFRAN-ODT) 4 MG disintegrating tablet  Take 1 tablet (4 mg total) by mouth every 8 (eight) hours as needed for nausea or vomiting.   oxyCODONE-acetaminophen (PERCOCET) 7.5-325 MG tablet Take 1 tablet by mouth 5 (five) times daily. Must last 30 days   oxyCODONE-acetaminophen (PERCOCET) 7.5-325 MG tablet Take 1 tablet by mouth 5 (five) times daily. Must last 30 days   oxyCODONE-acetaminophen (PERCOCET) 7.5-325 MG tablet Take 1 tablet by mouth 5 (five) times daily. Must last 30 days   [START ON 10/07/2023] oxyCODONE-acetaminophen (PERCOCET) 7.5-325 MG tablet Take 1 tablet by mouth 5 (five) times daily. Must last 30 days   spironolactone (ALDACTONE) 25 MG tablet TAKE ONE-HALF TABLET BY MOUTH ONCE DAILY AS DIRECTED   naloxone (NARCAN) nasal spray 4 mg/0.1 mL Place 1 spray into the nose as needed for up to 365 doses (for opioid-induced respiratory depresssion). In case of emergency (overdose), spray once into each nostril. If no response within 3 minutes, repeat application and call 911. (Patient not taking: Reported on 09/29/2023)   [DISCONTINUED] Cyanocobalamin (VITAMIN B12 PO) Take 1 tablet by mouth in the morning. vitafusion B12 vitamins (Patient not taking: Reported on 09/29/2023)   No facility-administered medications prior to visit.    Review of Systems  Constitutional: Negative.  Negative for chills and fever.  HENT: Negative.    Eyes: Negative.   Respiratory: Negative.  Negative for cough and shortness of breath.   Cardiovascular: Negative.  Negative for chest pain, palpitations and leg swelling.  Gastrointestinal: Negative.  Negative for abdominal pain, constipation, diarrhea, heartburn, nausea and vomiting.  Genitourinary: Negative.  Negative for dysuria and flank pain.  Musculoskeletal: Negative.  Negative for joint pain and myalgias.  Skin: Negative.   Neurological: Negative.  Negative for dizziness and headaches.  Endo/Heme/Allergies: Negative.   Psychiatric/Behavioral: Negative.  Negative for depression and suicidal ideas.  The patient is not nervous/anxious.        Objective:   BP 118/60   Pulse 78   Ht 5' (1.524 m)   Wt 125 lb (56.7 kg)   SpO2 99%   BMI 24.41 kg/m   Vitals:   09/29/23 1303  BP: 118/60  Pulse: 78  Height: 5' (1.524 m)  Weight: 125 lb (56.7 kg)  SpO2: 99%  BMI (Calculated): 24.41    Physical Exam Vitals and nursing note reviewed.  Constitutional:      Appearance: Normal appearance.  HENT:     Head: Normocephalic and atraumatic.     Nose: Nose normal.     Mouth/Throat:     Mouth: Mucous membranes are moist.     Pharynx: Oropharynx is clear.  Eyes:     Conjunctiva/sclera: Conjunctivae normal.     Pupils: Pupils are equal, round, and reactive to light.  Cardiovascular:     Rate and Rhythm: Normal rate and regular rhythm.     Pulses: Normal pulses.     Heart sounds: Normal heart sounds. No murmur heard. Pulmonary:     Effort: Pulmonary effort is normal.     Breath sounds: Normal breath sounds. No wheezing.  Abdominal:     General: Bowel sounds are normal.     Palpations: Abdomen is soft.     Tenderness: There is no abdominal tenderness. There is no right CVA tenderness or left CVA tenderness.  Musculoskeletal:        General: Normal range of motion.     Cervical back: Normal range of motion.     Right lower leg: No edema.     Left lower leg: No edema.  Skin:    General: Skin is warm and dry.  Neurological:     General: No focal deficit present.     Mental Status: She is alert and oriented to person, place, and time.  Psychiatric:        Mood and Affect: Mood normal.        Behavior: Behavior normal.      No results found for any visits on 09/29/23.  Recent Results (from the past 2160 hours)  POCT Urinalysis Dipstick (16109)     Status: Normal   Collection Time: 09/05/23 11:38 AM  Result Value Ref Range   Color, UA     Clarity, UA     Glucose, UA Negative Negative   Bilirubin, UA Negative    Ketones, UA Negative    Spec Grav, UA 1.025 1.010 - 1.025    Blood, UA Negative    pH, UA 6.0 5.0 - 8.0   Protein, UA Negative Negative   Urobilinogen, UA 0.2 0.2 or 1.0 E.U./dL   Nitrite, UA Negative    Leukocytes, UA Negative Negative   Appearance     Odor    VITAMIN D 25 Hydroxy (Vit-D Deficiency, Fractures)     Status: None   Collection Time: 09/16/23  9:41 AM  Result Value Ref Range   Vit D, 25-Hydroxy 33.3 30.0 - 100.0 ng/mL    Comment: Vitamin D deficiency has been defined by the Institute of Medicine and an Endocrine Society practice guideline as a level of serum 25-OH vitamin D less than 20 ng/mL (1,2). The Endocrine Society went on to further define vitamin D insufficiency as a level between 21 and 29 ng/mL (2). 1. IOM (Institute of Medicine). 2010. Dietary reference    intakes for calcium and D. [place]: The    [employer]. 2. Holick MF, Binkley Fillmore, Bischoff-Ferrari HA, et al.    Evaluation, treatment, and prevention of vitamin D    deficiency: an Endocrine Society clinical practice    guideline. JCEM. 2011 Jul; 96(7):1911-30.   TSH     Status: None   Collection Time: 09/16/23  9:41 AM  Result Value Ref Range   TSH 2.000 0.450 - 4.500 uIU/mL  CBC with Differential/Platelet     Status: Abnormal   Collection Time: 09/16/23  9:41 AM  Result Value Ref Range   WBC 4.6 3.4 - 10.8 x10E3/uL   RBC 3.74 (L) 3.77 - 5.28 x10E6/uL   Hemoglobin 12.5 11.1 - 15.9 g/dL   Hematocrit 96.0 45.4 - 46.6 %   MCV 102 (H) 79 - 97 fL   MCH 33.4 (H) 26.6 - 33.0 pg   MCHC 32.9 31.5 - 35.7 g/dL   RDW 09.8 11.9 - 14.7 %   Platelets 194 150 - 450 x10E3/uL   Neutrophils 46 Not Estab. %   Lymphs 43 Not Estab. %   Monocytes 9 Not Estab. %   Eos 2 Not Estab. %   Basos 0 Not Estab. %   Neutrophils Absolute 2.1 1.4 - 7.0 x10E3/uL   Lymphocytes Absolute 2.0 0.7 - 3.1 x10E3/uL   Monocytes Absolute 0.4 0.1 - 0.9 x10E3/uL   EOS (ABSOLUTE) 0.1 0.0 - 0.4 x10E3/uL   Basophils Absolute 0.0 0.0 - 0.2 x10E3/uL   Immature Granulocytes 0 Not  Estab. %   Immature Grans (Abs) 0.0 0.0 - 0.1 x10E3/uL  Hemoglobin A1c     Status: None   Collection Time: 09/16/23  9:41 AM  Result Value Ref Range   Hgb A1c MFr Bld 5.4 4.8 - 5.6 %    Comment:          Prediabetes: 5.7 - 6.4          Diabetes: >6.4          Glycemic control for adults with diabetes: <7.0    Est. average glucose Bld gHb Est-mCnc 108 mg/dL  Lipid panel     Status: None   Collection Time: 09/16/23  9:41 AM  Result Value Ref Range   Cholesterol, Total 132 100 - 199 mg/dL   Triglycerides 89 0 - 149 mg/dL   HDL 50 >82 mg/dL   VLDL Cholesterol Cal 17 5 - 40 mg/dL   LDL Chol Calc (NIH) 65 0 - 99 mg/dL   Chol/HDL Ratio 2.6 0.0 - 4.4 ratio    Comment:                                   T. Chol/HDL Ratio                                             Men  Women                               1/2 Avg.Risk  3.4    3.3                                   Avg.Risk  5.0    4.4                                2X Avg.Risk  9.6    7.1  3X Avg.Risk 23.4   11.0   CMP14+EGFR     Status: Abnormal   Collection Time: 09/16/23  9:41 AM  Result Value Ref Range   Glucose 84 70 - 99 mg/dL   BUN 8 8 - 27 mg/dL   Creatinine, Ser 8.11 0.57 - 1.00 mg/dL   eGFR 74 >91 YN/WGN/5.62   BUN/Creatinine Ratio 10 (L) 12 - 28   Sodium 142 134 - 144 mmol/L   Potassium 4.1 3.5 - 5.2 mmol/L   Chloride 104 96 - 106 mmol/L   CO2 27 20 - 29 mmol/L   Calcium 9.0 8.7 - 10.3 mg/dL   Total Protein 5.0 (L) 6.0 - 8.5 g/dL   Albumin 3.4 (L) 3.8 - 4.8 g/dL   Globulin, Total 1.6 1.5 - 4.5 g/dL   Bilirubin Total 0.3 0.0 - 1.2 mg/dL   Alkaline Phosphatase 56 44 - 121 IU/L   AST 15 0 - 40 IU/L   ALT 9 0 - 32 IU/L      Assessment & Plan:  Continue current medications.  Check blood work.  Follow-up with the psychiatrist at [facility]. Problem List Items Addressed This Visit     Chronic pain syndrome (Chronic)   Hyperlipidemia   Anxiety, generalized   Essential hypertension, benign -  Primary   Hyperglycemia   Other Visit Diagnoses       Breast cancer screening by mammogram       Relevant Orders   MM 3D SCREENING MAMMOGRAM BILATERAL BREAST       Follow up 3 months.   Total time spent: 30 minutes  Margaretann Loveless, MD  09/29/2023   This document may have been prepared by [facility] Voice Recognition software and as such may include unintentional dictation errors.

## 2023-09-30 ENCOUNTER — Ambulatory Visit: Admitting: Pain Medicine

## 2023-10-09 ENCOUNTER — Other Ambulatory Visit: Payer: Self-pay | Admitting: Neurological Surgery

## 2023-10-09 DIAGNOSIS — M4316 Spondylolisthesis, lumbar region: Secondary | ICD-10-CM

## 2023-10-15 ENCOUNTER — Ambulatory Visit
Admission: RE | Admit: 2023-10-15 | Discharge: 2023-10-15 | Disposition: A | Discharge status: Home / Self Care | Source: Ambulatory Visit | Attending: Neurological Surgery | Admitting: Neurological Surgery

## 2023-10-15 DIAGNOSIS — M4316 Spondylolisthesis, lumbar region: Secondary | ICD-10-CM

## 2023-10-23 ENCOUNTER — Other Ambulatory Visit: Payer: Self-pay | Admitting: Internal Medicine

## 2023-10-23 DIAGNOSIS — I10 Essential (primary) hypertension: Secondary | ICD-10-CM

## 2023-10-23 DIAGNOSIS — E782 Mixed hyperlipidemia: Secondary | ICD-10-CM

## 2023-10-30 ENCOUNTER — Ambulatory Visit
Admission: RE | Admit: 2023-10-30 | Discharge: 2023-10-30 | Disposition: A | Discharge status: Home / Self Care | Source: Ambulatory Visit | Attending: Internal Medicine | Admitting: Internal Medicine

## 2023-10-30 ENCOUNTER — Other Ambulatory Visit: Payer: Self-pay | Admitting: Orthopedic Surgery

## 2023-10-30 DIAGNOSIS — Z1231 Encounter for screening mammogram for malignant neoplasm of breast: Secondary | ICD-10-CM | POA: Diagnosis present

## 2023-10-30 DIAGNOSIS — M545 Low back pain, unspecified: Secondary | ICD-10-CM

## 2023-11-03 ENCOUNTER — Ambulatory Visit: Payer: Medicare Other | Attending: Pain Medicine | Admitting: Pain Medicine

## 2023-11-03 ENCOUNTER — Encounter: Payer: Self-pay | Admitting: Pain Medicine

## 2023-11-03 ENCOUNTER — Other Ambulatory Visit: Payer: Self-pay | Admitting: Internal Medicine

## 2023-11-03 DIAGNOSIS — G894 Chronic pain syndrome: Secondary | ICD-10-CM | POA: Diagnosis present

## 2023-11-03 DIAGNOSIS — Z79891 Long term (current) use of opiate analgesic: Secondary | ICD-10-CM | POA: Diagnosis present

## 2023-11-03 DIAGNOSIS — M533 Sacrococcygeal disorders, not elsewhere classified: Secondary | ICD-10-CM | POA: Diagnosis not present

## 2023-11-03 DIAGNOSIS — M542 Cervicalgia: Secondary | ICD-10-CM | POA: Diagnosis not present

## 2023-11-03 DIAGNOSIS — M7062 Trochanteric bursitis, left hip: Secondary | ICD-10-CM | POA: Insufficient documentation

## 2023-11-03 DIAGNOSIS — M25551 Pain in right hip: Secondary | ICD-10-CM | POA: Diagnosis present

## 2023-11-03 DIAGNOSIS — M7061 Trochanteric bursitis, right hip: Secondary | ICD-10-CM | POA: Insufficient documentation

## 2023-11-03 DIAGNOSIS — M961 Postlaminectomy syndrome, not elsewhere classified: Secondary | ICD-10-CM | POA: Diagnosis present

## 2023-11-03 DIAGNOSIS — M545 Low back pain, unspecified: Secondary | ICD-10-CM | POA: Insufficient documentation

## 2023-11-03 DIAGNOSIS — M25552 Pain in left hip: Secondary | ICD-10-CM | POA: Insufficient documentation

## 2023-11-03 DIAGNOSIS — G8929 Other chronic pain: Secondary | ICD-10-CM | POA: Diagnosis present

## 2023-11-03 DIAGNOSIS — M79604 Pain in right leg: Secondary | ICD-10-CM | POA: Diagnosis not present

## 2023-11-03 DIAGNOSIS — Z79899 Other long term (current) drug therapy: Secondary | ICD-10-CM | POA: Insufficient documentation

## 2023-11-03 DIAGNOSIS — M47816 Spondylosis without myelopathy or radiculopathy, lumbar region: Secondary | ICD-10-CM | POA: Diagnosis present

## 2023-11-03 DIAGNOSIS — M79605 Pain in left leg: Secondary | ICD-10-CM | POA: Diagnosis not present

## 2023-11-03 DIAGNOSIS — R928 Other abnormal and inconclusive findings on diagnostic imaging of breast: Secondary | ICD-10-CM

## 2023-11-03 MED ORDER — OXYCODONE-ACETAMINOPHEN 7.5-325 MG PO TABS: 1.0000 | ORAL_TABLET | Freq: Every day | ORAL | 0 refills | Status: DC

## 2023-11-03 NOTE — Patient Instructions (Addendum)
 Fill 11-06-23 12-06-23 01-05-24

## 2023-11-03 NOTE — Progress Notes (Signed)
 Nursing Pain Medication Assessment:  Safety precautions to be maintained throughout the outpatient stay will include: orient to surroundings, keep bed in low position, maintain call bell within reach at all times, provide assistance with transfer out of bed and ambulation.  Medication Inspection Compliance: Pill count conducted under aseptic conditions, in front of the patient. Neither the pills nor the bottle was removed from the patient's sight at any time. Once count was completed pills were immediately returned to the patient in their original bottle.  Medication: Oxycodone /APAP Pill/Patch Count:  15 of 150 pills remain Pill/Patch Appearance: Markings consistent with prescribed medication Bottle Appearance: Standard pharmacy container. Clearly labeled. Filled Date: 04 / 08 / 2025 Last Medication intake:  Today

## 2023-11-03 NOTE — Progress Notes (Signed)
 PROVIDER NOTE: Interpretation of information contained herein should be left to medically-trained personnel. Specific patient instructions are provided elsewhere under "Patient Instructions" section of medical record. This document was created in part using AI and STT-dictation technology, any transcriptional errors that may result from this process are unintentional.  Patient: Jacqueline Thornton  Service: E/M   PCP: Aisha Hove, MD  DOB: [birth year]  DOS: 11/03/2023  Provider: Cherylin Corrigan, NP  MRN: [id number]  Delivery: Face-to-face  Specialty: Interventional Pain Management  Type: Established Patient  Setting: Ambulatory outpatient facility  Specialty designation: 09  Referring Prov.: Aisha Hove, MD  Location: Outpatient office facility       HPI  [name], a 73 y.o. year old female, is here today because of her No primary diagnosis found.. Ms. Delamater's primary complain today is Back Pain  Pertinent problems: Ms. Janota does not have any pertinent problems on file. Pain Assessment: Severity of Chronic pain is reported as a 5 /10. Location: Back Lower, Right, Left/Radaites from lower back into outer and posterior left leg into calf. Onset: More than a month ago. Quality: Constant, Squeezing. Timing: Constant. Modifying factor(s): Pain medication. Vitals:  height is 5' (1.524 m) and weight is 129 lb (58.5 kg). Her temporal temperature is 96.6 F (35.9 C) (abnormal). Her blood pressure is 119/70 and her pulse is 83. Her respiration is 16 and oxygen saturation is 100%.  BMI: Estimated body mass index is 25.19 kg/m as calculated from the following:   Height as of this encounter: 5' (1.524 m).   Weight as of this encounter: 129 lb (58.5 kg). Last encounter: 07/29/2023. Last procedure: 07/15/2023.  Reason for encounter: medication management.  The patient indicates doing well with the current pain medication regimen.  No side effects or adverse reaction reported to the medication.  The  patient experiences ongoing low back and leg pain, with the left side worse than the right.  The pain radiates from the posterior left side down the leg to the calf of left leg.  Pharmacotherapy Assessment  Analgesic: Oxycodone -acetaminophen  (Percocet) 7.5-225 mg tablet 5 times daily as needed for pain. MME=56.25 Monitoring: [place] PMP: PDMP reviewed during this encounter.       Pharmacotherapy: No side-effects or adverse reactions reported. Compliance: No problems identified. Effectiveness: Clinically acceptable.  Huston Maiers, RN  11/03/2023 10:57 AM  Sign when Signing Visit Nursing Pain Medication Assessment:  Safety precautions to be maintained throughout the outpatient stay will include: orient to surroundings, keep bed in low position, maintain Thornton bell within reach at all times, provide assistance with transfer out of bed and ambulation.  Medication Inspection Compliance: Pill count conducted under aseptic conditions, in front of the patient. Neither the pills nor the bottle was removed from the patient's sight at any time. Once count was completed pills were immediately returned to the patient in their original bottle.  Medication: Oxycodone /APAP Pill/Patch Count:  15 of 150 pills remain Pill/Patch Appearance: Markings consistent with prescribed medication Bottle Appearance: Standard pharmacy container. Clearly labeled. Filled Date: 04 / 08 / 2025 Last Medication intake:  Today    No results found for: "CBDTHCR" No results found for: "D8THCCBX" No results found for: "D9THCCBX"  UDS:  Summary  Date Value Ref Range Status  01/01/2023 Note  Final    Comment:    ==================================================================== [facility] (MW) ==================================================================== Test  Result       Flag       Units  Drug Present and Declared for Prescription Verification   Oxycodone                       7012          EXPECTED   ng/mg creat   Oxymorphone                    2293         EXPECTED   ng/mg creat   Noroxycodone                   9132         EXPECTED   ng/mg creat   Noroxymorphone                 590          EXPECTED   ng/mg creat    Sources of oxycodone  are scheduled prescription medications.    Oxymorphone, noroxycodone, and noroxymorphone are expected    metabolites of oxycodone . Oxymorphone is also available as a    scheduled prescription medication.  ==================================================================== Test                      Result    Flag   Units      Ref Range   Creatinine              41               mg/dL      >=16 ==================================================================== Declared Medications:  The flagging and interpretation on this report are based on the  following declared medications.  Unexpected results may arise from  inaccuracies in the declared medications.   **Note: The testing scope of this panel includes these medications:   Oxycodone  (Percocet)   **Note: The testing scope of this panel does not include the  following reported medications:   Acetaminophen  (Percocet)  Atorvastatin  (Lipitor)  Calcium   Cyanocobalamin   Docusate (Colace)  Duloxetine  (Cymbalta )  Enalapril  (Vasotec )  Estradiol  (Estrace )  Furosemide  (Lasix )  Gabapentin  (Neurontin )  Magnesium  Melatonin  Methocarbamol  (Robaxin )  Multivitamin  Naloxone  (Narcan )  Omeprazole (Prilosec)  Spironolactone  (Aldactone )  Supplement  Vitamin D  ==================================================================== For clinical consultation, please Thornton [phone]. ====================================================================       ROS  Constitutional: Denies any fever or chills Gastrointestinal: No reported hemesis, hematochezia, vomiting, or acute GI distress Musculoskeletal: low back pain (L>R) Neurological: No reported episodes of acute onset apraxia,  aphasia, dysarthria, agnosia, amnesia, paralysis, loss of coordination, or loss of consciousness  Medication Review  Calcium -Vitamin D -Vitamin K, DULoxetine , Magnesium, Melatonin, atorvastatin , docusate sodium , enalapril , estradiol , furosemide , gabapentin , methocarbamol , multivitamin with minerals, naloxone , omeprazole, ondansetron , oxyCODONE -acetaminophen , and spironolactone   History Review  Allergy: Ms. Austria has no known allergies. Drug: Ms. Sim  reports no history of drug use. Alcohol:  reports no history of alcohol use. Tobacco:  reports that she quit smoking about 20 years ago. Her smoking use included cigarettes. She has never used smokeless tobacco. Social: Ms. Peikert  reports that she quit smoking about 20 years ago. Her smoking use included cigarettes. She has never used smokeless tobacco. She reports that she does not drink alcohol and does not use drugs. 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 Surgical: Ms. Desmidt  has a past surgical history that includes Cholecystectomy; Abdominal hysterectomy; Neck surgery; Colonoscopy with propofol  (N/A, 07/19/2015); caract surger; Laparoscopic salpingo oophorectomy (Bilateral, 03/03/2018); Cystoscopy (03/03/2018); Tonsillectomy; Appendectomy; Tumor removal; Anterior lumbar fusion (N/A, 12/11/2018); Abdominal  exposure (N/A, 12/11/2018); Anterior cervical decomp/discectomy fusion (N/A, 10/06/2020); Toe Fusion (Right); Esophagogastroduodenoscopy (egd) with propofol  (N/A, 09/11/2021); Colonoscopy with propofol  (N/A, 09/11/2021); Colonoscopy with propofol  (N/A, 03/05/2022); and Transforaminal lumbar interbody fusion w/ mis 1 level (Right, 04/30/2022). Family: family history includes Colon cancer in her maternal grandmother and mother; Diabetes in her father and mother; Hypertension in her sister; Stroke in her mother.  Laboratory Chemistry Profile   Renal Lab Results  Component Value Date   BUN 8 09/16/2023   CREATININE 0.84 09/16/2023   BCR 10 (L) 09/16/2023   GFRAA 47 (L) 12/16/2019   GFRNONAA >60 04/24/2022    Hepatic Lab Results  Component Value Date   AST 15 09/16/2023   ALT 9 09/16/2023   ALBUMIN 3.4 (L) 09/16/2023   ALKPHOS 56 09/16/2023    Electrolytes Lab Results  Component Value Date   NA 142 09/16/2023   K 4.1 09/16/2023   CL 104 09/16/2023   CALCIUM  9.0 09/16/2023   MG 2.4 (H) 12/16/2019    Bone Lab Results  Component Value Date   VD25OH 33.3 09/16/2023   25OHVITD1 53 12/16/2019   25OHVITD2 <1.0 12/16/2019   25OHVITD3 53 12/16/2019    Inflammation (CRP: Acute Phase) (ESR: Chronic Phase) Lab Results  Component Value Date   CRP 4 12/16/2019   ESRSEDRATE 6 12/16/2019         Note: Above Lab results reviewed.  Recent Imaging Review  DG PAIN CLINIC C-ARM 1-60 MIN NO REPORT Fluoro was used, but no Radiologist interpretation will be provided.  Please refer to "NOTES" tab for provider progress note. Note: Reviewed        Physical Exam  General appearance: Well nourished, well developed, and well hydrated. In no apparent acute distress Mental status: Alert, oriented x 3 (person, place, & time)       Respiratory: No evidence of acute respiratory distress Eyes: PERLA Vitals: BP 119/70 (Patient Position: Sitting, Cuff Size: Normal)   Pulse 83   Temp (!) 96.6 F (35.9  C) (Temporal)   Resp 16   Ht 5' (1.524 m)   Wt 129 lb (58.5 kg)   SpO2 100%   BMI 25.19 kg/m  BMI: Estimated body mass index is 25.19 kg/m as calculated from the following:   Height as of this encounter: 5' (1.524 m).   Weight as of this encounter: 129 lb (58.5 kg). Ideal: Ideal body weight: 45.5 kg (100 lb 4.9 oz) Adjusted ideal body weight: 50.7 kg (111 lb 12.6 oz)  Assessment   Diagnosis Status  1. Chronic low back pain (Bilateral) (R>L) w/o sciatica   2. Chronic lower extremity pain (Bilateral) (R>L)   3. Chronic hip pain (Bilateral) (L>R)   4. Chronic sacroiliac joint pain (Bilateral) (L>R)   5. Chronic neck pain (Left)   6. Lumbar facet syndrome (Bilateral) (R>L)   7. Trochanteric bursitis of hip (Bilateral) (L>R)   8. Failed back surgical syndrome   9. Chronic  pain syndrome   10. Pharmacologic therapy   11. Chronic use of opiate for therapeutic purpose   12. Encounter for medication management   13. Encounter for chronic pain management    Controlled Controlled Controlled   Updated Problems: No problems updated.  Plan of Care  Problem-specific:  Assessment and Plan  We will continue the current medication regimen.  Prescription drug monitoring (PDMP) consistent with the prescribed medication.  Urine drug screen (UDS) up-to-date.  The patient was advised that if she requires interventional therapy in the future, she will need to schedule a separate appointment with Dr. Naveira.       Interventional Therapies  Risk Factors  Considerations:      Lumbar HARDWARE (NO RFA)       Planned  Pending:   Diagnostic bilateral lumbar MBB (pedicle screw hardware inj.)  #1 (12/17/2022)  Diagnostic/therapeutic midline caudal ESI #2 + sacral interspinous ligament MNB/TPI #1     Under consideration:   Diagnostic lumbar SCS trial     Completed:   Diagnostic bilateral lumbar MBB (pedicle screw hardware inj.) x1 (12/17/2022) (100/100/100/R:75L:50)  Diagnostic bilateral  L4 TFESI x1 (01/08/2022) (100/25/0)  Therapeutic right Racz procedure x1 (08/17/2020) (04/09/84/85) (100% relief of the right LEP) Diagnostic caudal ESI x1 (07/11/2020) (100/100/0/0)  Palliative left L5-S1 LESI x2 (07/27/2015) (70/70/0/0)  Diagnostic left lumbar facet MBB x7 (02/07/2022) (20/20/0/0)  Palliative right lumbar facet MBB x10 (02/07/2022) (20/20/0/0)  Therapeutic right shoulder joint inj. x1 (02/29/2016) (90/80/30)  Therapeutic left IA hip injection x3 (06/13/2020) (100/100/30/>50)  Diagnostic right SI joint Blk x3 (06/17/2017) (100/90/80/80)  Diagnostic/therapeutic left SI joint Blk x4 (06/17/2017) (100/90/80/80)  Palliative bilateral trochanteric bursa inj. x1 (12/11/2016) (100/100/90/90)  Therapeutic right lumbar facet RFA x2 (01/11/2021) (100/100/70)  Therapeutic left lumbar facet RFA x2 (12/19/2020) (100/100/75/>75) Therapeutic right SI joint RFA x1 (08/07/2017) (100/100/80)  Therapeutic left SI joint RFA x1 (09/16/2017) (100/100/100)     Therapeutic  Palliative (PRN) options:   Palliative Racz procedure  Palliative bilateral SI joint block  Palliative right trochanteric bursa injection     Pharmacotherapy  Nonopioids transferred 05/24/2020: Vitamin B12, Flexeril , and Neurontin .      [name] has a current medication list which includes the following long-term medication(s): atorvastatin , viactiv calcium  plus d, duloxetine , enalapril , estradiol , furosemide , gabapentin , naloxone , spironolactone , omeprazole, oxycodone -acetaminophen , [START ON 11/06/2023] oxycodone -acetaminophen , [START ON 12/06/2023] oxycodone -acetaminophen , and [START ON 01/05/2024] oxycodone -acetaminophen .  Pharmacotherapy (Medications Ordered): Meds ordered this encounter  Medications   oxyCODONE -acetaminophen  (PERCOCET) 7.5-325 MG tablet    Sig: Take 1 tablet by mouth 5 (five) times daily. Must last 30 days    Dispense:  150 tablet    Refill:  0    DO NOT: delete (not duplicate); no partial-fill (will  deny script to complete), no refill request (F/U required). DISPENSE: 1 day early if closed on fill date. WARN: No CNS-depressants within 8 hrs of med.   oxyCODONE -acetaminophen  (PERCOCET) 7.5-325 MG tablet    Sig: Take 1 tablet by mouth 5 (five) times daily. Must last 30 days    Dispense:  150 tablet    Refill:  0    DO NOT: delete (not duplicate); no partial-fill (will deny script to complete), no refill request (F/U required). DISPENSE: 1 day early if closed on fill date. WARN: No CNS-depressants within 8 hrs of med.   oxyCODONE -acetaminophen  (PERCOCET) 7.5-325 MG tablet    Sig: Take 1 tablet by mouth 5 (five) times daily. Must last 30 days    Dispense:  150 tablet    Refill:  0    DO NOT: delete (not duplicate); no partial-fill (will deny script to complete), no refill request (F/U required). DISPENSE: 1 day early if closed on fill date. WARN: No CNS-depressants within 8 hrs of med.   Orders:  No orders of the defined types were placed in this encounter.  Follow-up plan:   Return in about 13 weeks (around 02/02/2024) for (F2F), (MM), Marthe Slain NP.        Recent Visits No visits were found meeting these conditions. Showing recent visits within past 90 days and meeting all other requirements Today's Visits Date Type Provider Dept  11/03/23 Office Visit Renaldo Caroli, MD [facility]  Showing today's visits and meeting all other requirements Future Appointments No visits were found meeting these conditions. Showing future appointments within next 90 days and meeting all other requirements  I discussed the assessment and treatment plan with the patient. The patient was provided an opportunity to ask questions and all were answered. The patient agreed with the plan and demonstrated an understanding of the instructions.  Patient advised to Thornton back or seek an in-person evaluation if the symptoms or condition worsens.  Duration of encounter: 30 minutes.  Total time on  encounter, as per AMA guidelines included both the face-to-face and non-face-to-face time personally spent by the physician and/or other qualified health care professional(s) on the day of the encounter (includes time in activities that require the physician or other qualified health care professional and does not include time in activities normally performed by clinical staff). Physician's time may include the following activities when performed: Preparing to see the patient (e.g., pre-charting review of records, searching for previously ordered imaging, lab work, and nerve conduction tests) Review of prior analgesic pharmacotherapies. Reviewing PMP Interpreting ordered tests (e.g., lab work, imaging, nerve conduction tests) Performing post-procedure evaluations, including interpretation of diagnostic procedures Obtaining and/or reviewing separately obtained history Performing a medically appropriate examination and/or evaluation Counseling and educating the patient/family/caregiver Ordering medications, tests, or procedures Referring and communicating with other health care professionals (when not separately reported) Documenting clinical information in the electronic or other health record Independently interpreting results (not separately reported) and communicating results to the patient/ family/caregiver Care coordination (not separately reported)  Note by: Asheley Hellberg K Jelena Malicoat, NP (TTS and AI technology used. I apologize for any typographical errors that were not detected and corrected.) Date: 11/03/2023; Time: 11:35 AM

## 2023-11-11 ENCOUNTER — Ambulatory Visit
Admission: RE | Admit: 2023-11-11 | Discharge: 2023-11-11 | Disposition: A | Discharge status: Home / Self Care | Source: Ambulatory Visit | Attending: Internal Medicine | Admitting: Internal Medicine

## 2023-11-11 DIAGNOSIS — R928 Other abnormal and inconclusive findings on diagnostic imaging of breast: Secondary | ICD-10-CM | POA: Insufficient documentation

## 2023-11-19 ENCOUNTER — Ambulatory Visit
Admission: RE | Admit: 2023-11-19 | Discharge: 2023-11-19 | Disposition: A | Discharge status: Home / Self Care | Source: Ambulatory Visit | Attending: Orthopedic Surgery | Admitting: Orthopedic Surgery

## 2023-11-19 DIAGNOSIS — M545 Low back pain, unspecified: Secondary | ICD-10-CM

## 2023-11-25 ENCOUNTER — Other Ambulatory Visit: Payer: Self-pay | Admitting: Orthopedic Surgery

## 2023-11-25 DIAGNOSIS — M545 Low back pain, unspecified: Secondary | ICD-10-CM

## 2023-12-05 ENCOUNTER — Other Ambulatory Visit

## 2023-12-10 ENCOUNTER — Other Ambulatory Visit

## 2023-12-10 ENCOUNTER — Inpatient Hospital Stay: Admission: RE | Admit: 2023-12-10 | Source: Ambulatory Visit

## 2023-12-19 ENCOUNTER — Other Ambulatory Visit

## 2023-12-19 ENCOUNTER — Ambulatory Visit
Admission: RE | Admit: 2023-12-19 | Discharge: 2023-12-19 | Disposition: A | Discharge status: Home / Self Care | Source: Ambulatory Visit | Attending: Orthopedic Surgery | Admitting: Orthopedic Surgery

## 2023-12-19 DIAGNOSIS — M545 Low back pain, unspecified: Secondary | ICD-10-CM

## 2023-12-19 MED ORDER — IOPAMIDOL (ISOVUE-M 200) INJECTION 41%: 1.0000 mL | Freq: Once | INTRAMUSCULAR | Status: DC | PRN

## 2023-12-29 ENCOUNTER — Encounter: Payer: Self-pay | Admitting: Internal Medicine

## 2023-12-29 ENCOUNTER — Ambulatory Visit (INDEPENDENT_AMBULATORY_CARE_PROVIDER_SITE_OTHER): Payer: PRIVATE HEALTH INSURANCE | Admitting: Internal Medicine

## 2023-12-29 VITALS — BP 150/66 | HR 87 | Ht 60.0 in | Wt 125.8 lb

## 2023-12-29 DIAGNOSIS — G894 Chronic pain syndrome: Secondary | ICD-10-CM | POA: Diagnosis not present

## 2023-12-29 DIAGNOSIS — F411 Generalized anxiety disorder: Secondary | ICD-10-CM

## 2023-12-29 DIAGNOSIS — I10 Essential (primary) hypertension: Secondary | ICD-10-CM | POA: Diagnosis not present

## 2023-12-29 DIAGNOSIS — M533 Sacrococcygeal disorders, not elsewhere classified: Secondary | ICD-10-CM

## 2023-12-29 DIAGNOSIS — F32A Depression, unspecified: Secondary | ICD-10-CM

## 2023-12-29 DIAGNOSIS — G8929 Other chronic pain: Secondary | ICD-10-CM

## 2023-12-29 DIAGNOSIS — R739 Hyperglycemia, unspecified: Secondary | ICD-10-CM

## 2023-12-29 MED ORDER — GABAPENTIN 300 MG PO CAPS: 300.0000 mg | ORAL_CAPSULE | Freq: Three times a day (TID) | ORAL | 3 refills | Status: AC

## 2023-12-29 MED ORDER — DULOXETINE HCL 30 MG PO CPEP: 30.0000 mg | ORAL_CAPSULE | Freq: Every day | ORAL | 0 refills | Status: DC

## 2023-12-29 MED ORDER — MIRTAZAPINE 30 MG PO TBDP: 30.0000 mg | ORAL_TABLET | Freq: Every day | ORAL | 6 refills | Status: AC

## 2023-12-29 NOTE — Progress Notes (Signed)
 Established Patient Office Visit  Subjective:  Patient ID: Jacqueline Thornton, female    DOB: [birth year]  Age: 73 y.o. MRN: [id number]  Chief Complaint  Patient presents with   Follow-up    3 month follow up    Patient comes in for her follow-up today.  Her blood pressure is high today as she is upset at her husband's behavior.  She continues to have chronic back pain and now diagnosed with sacroilitis as well. She has lost weight, says she does not feel hungry.  Admits to being depressed while on Cymbalta .  She she has not seen a psychiatrist yet.  Will taper off her Cymbalta  and start Remeron at bedtime. Denies chest pain or shortness, of breath no nausea vomiting or diarrhea.    No other concerns at this time.   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   Atypical face pain 04/24/2015   Back pain    CAD (coronary artery disease)    pt denies   Cancer (HCC)    squamous cell- R temple    Chronic back pain    Constipation    Depression    Fibromyalgia    GERD (gastroesophageal reflux disease)    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) 04/11/2015   H/O: hysterectomy 1979   Heart murmur    Hyperlipidemia    Hypertension    Low back pain 04/06/2015   Lumbar radicular pain 04/18/2015   Migraine    Narrowing of intervertebral disc space 04/11/2015   Sacroiliac joint pain 04/11/2015   Spine disorder     Past Surgical History:  Procedure Laterality Date   ABDOMINAL EXPOSURE N/A 12/11/2018   Procedure: ABDOMINAL EXPOSURE;  Surgeon: Serene Gaile ORN, MD;  Location: [facility] OR;  Service: Vascular;  Laterality: N/A;   ABDOMINAL HYSTERECTOMY     ANTERIOR CERVICAL DECOMP/DISCECTOMY FUSION N/A 10/06/2020   Procedure: Cervical three -four Anterior cervical decompression/discectomy/fusion  with possible anchor c and  exploration of fusion Cervical four to Six;  Surgeon: Unice Pac, MD;  Location: [facility] OR;  Service: Neurosurgery;  Laterality: N/A;   ANTERIOR LUMBAR FUSION N/A 12/11/2018   Procedure: Lumbar five Sacral one Anterior lumbar interbody fusion;  Surgeon: Unice Pac, MD;  Location: [facility] OR;  Service: Neurosurgery;  Laterality: N/A;   APPENDECTOMY     Age19   caract surger     02/24/17   CHOLECYSTECTOMY     COLONOSCOPY WITH PROPOFOL  N/A 07/19/2015   Procedure: COLONOSCOPY WITH PROPOFOL ;  Surgeon: Lamar ONEIDA Holmes, MD;  Location: [facility] ENDOSCOPY;  Service: Endoscopy;  Laterality: N/A;   COLONOSCOPY WITH PROPOFOL  N/A 09/11/2021   Procedure: COLONOSCOPY WITH PROPOFOL ;  Surgeon: Maryruth Ole ONEIDA, MD;  Location: [facility] ENDOSCOPY;  Service: Endoscopy;  Laterality: N/A;   COLONOSCOPY WITH PROPOFOL  N/A 03/05/2022   Procedure: COLONOSCOPY WITH PROPOFOL ;  Surgeon: Maryruth Ole ONEIDA, MD;  Location: [facility] ENDOSCOPY;  Service: Endoscopy;  Laterality: N/A;   CYSTOSCOPY  03/03/2018   Procedure: CYSTOSCOPY;  Surgeon: Leonce Garnette BIRCH, MD;  Location: [facility] ORS;  Service: Gynecology;;   ESOPHAGOGASTRODUODENOSCOPY (EGD) WITH PROPOFOL  N/A 09/11/2021   Procedure: ESOPHAGOGASTRODUODENOSCOPY (EGD) WITH PROPOFOL ;  Surgeon: Maryruth Ole ONEIDA, MD;  Location: [facility] ENDOSCOPY;  Service: Endoscopy;  Laterality: N/A;   LAPAROSCOPIC SALPINGO OOPHERECTOMY Bilateral 03/03/2018   Procedure: LAPAROSCOPIC  SALPINGO OOPHORECTOMY;  Surgeon: Leonce Garnette BIRCH, MD;  Location: [facility] ORS;  Service: Gynecology;  Laterality: Bilateral;   NECK SURGERY     Age 20 and 32.   TOE FUSION Right    TONSILLECTOMY     Age 65   TRANSFORAMINAL LUMBAR INTERBODY FUSION W/ MIS 1 LEVEL Right 04/30/2022   Procedure: Minimally Invasive  Decompression and  Transforaminal Lumbar Interbody Fusion Lumbar Four-Five;  Surgeon: Dawley, Lani BROCKS, DO;  Location: [facility] OR;  Service: Neurosurgery;  Laterality: Right;  3C   TUMOR REMOVAL     Ovaries    Social  History   Socioeconomic History   Marital status: Married    Spouse name: Not on file   Number of children: Not on file   Years of education: Not on file   Highest education level: Not on file  Occupational History   Not on file  Tobacco Use   Smoking status: Former    Current packs/day: 0.00    Types: Cigarettes    Quit date: 07/02/2003    Years since quitting: 20.5   Smokeless tobacco: Never  Vaping Use   Vaping status: Never Used  Substance and Sexual Activity   Alcohol use: No    Alcohol/week: 0.0 standard drinks of alcohol   Drug use: No   Sexual activity: Not Currently  Other Topics Concern   Not on file  Social History Narrative   Not on file   Social Drivers of Health   Financial Resource Strain: Low Risk  (06/17/2017)   Overall Financial Resource Strain (CARDIA)    Difficulty of Paying Living Expenses: Not hard at all  Food Insecurity: No Food Insecurity (02/28/2022)   Hunger Vital Sign    Worried About Running Out of Food in the Last Year: Never true    Ran Out of Food in the Last Year: Never true  Transportation Needs: No Transportation Needs (02/28/2022)   PRAPARE - Administrator, Civil Service (Medical): No    Lack of Transportation (Non-Medical): No  Physical Activity: Unknown (06/17/2017)   Exercise Vital Sign    Days of Exercise per Week: Patient declined    Minutes of Exercise per Session: Patient declined  Stress: No Stress Concern Present (06/17/2017)   [employer] of Occupational Health - Occupational Stress Questionnaire    Feeling of Stress : Not at all  Social Connections: Unknown (06/17/2017)   Social Connection and Isolation Panel    Frequency of Communication with Friends and Family: Patient declined    Frequency of Social Gatherings with Friends and Family: Patient declined    Attends Religious Services: Patient declined    Database administrator or Organizations: Patient declined    Attends Banker Meetings:  Patient declined    Marital Status: Patient declined  Intimate Partner Violence: Unknown (06/17/2017)   Humiliation, Afraid, Rape, and Kick questionnaire    Fear of Current or Ex-Partner: Patient declined    Emotionally Abused: Patient declined    Physically Abused: Patient declined    Sexually Abused: Patient declined    Family History  Problem Relation Age of Onset   Diabetes Father    Hypertension Sister    Diabetes Mother    Stroke Mother    Colon cancer Mother    Colon cancer Maternal Grandmother    Ovarian cancer Neg Hx    Breast cancer Neg Hx     No Known Allergies  Outpatient Medications Prior to Visit  Medication Sig  atorvastatin  (LIPITOR) 20 MG tablet Take 1 tablet by mouth once daily   Calcium -Vitamin D -Vitamin K (VIACTIV CALCIUM  PLUS D) 650-12.5-40 MG-MCG-MCG CHEW Chew 1 tablet by mouth in the morning and at bedtime. Morning & supper   docusate sodium  (COLACE) 100 MG capsule Take 100 mg by mouth 2 (two) times daily.   enalapril  (VASOTEC ) 5 MG tablet Take 1 tablet by mouth once daily   estradiol  (ESTRACE ) 0.5 MG tablet Take 1 tablet by mouth once daily   furosemide  (LASIX ) 20 MG tablet Take 20 mg by mouth daily with lunch.   Magnesium 200 MG TABS Take 1 tablet by mouth at bedtime.   Melatonin 10 MG TABS Take 10 mg by mouth at bedtime.   methocarbamol  (ROBAXIN ) 500 MG tablet Take 1 tablet (500 mg total) by mouth 4 (four) times daily.   Multiple Vitamin (MULTIVITAMIN WITH MINERALS) TABS tablet Take 1 tablet by mouth daily after supper. Women's Multivitamin (Gummy)   naloxone  (NARCAN ) nasal spray 4 mg/0.1 mL Place 1 spray into the nose as needed for up to 365 doses (for opioid-induced respiratory depresssion). In case of emergency (overdose), spray once into each nostril. If no response within 3 minutes, repeat application and call 911.   omeprazole (PRILOSEC) 40 MG capsule Take 40 mg by mouth daily before breakfast.   ondansetron  (ZOFRAN -ODT) 4 MG disintegrating  tablet Take 1 tablet (4 mg total) by mouth every 8 (eight) hours as needed for nausea or vomiting.   oxyCODONE -acetaminophen  (PERCOCET) 7.5-325 MG tablet Take 1 tablet by mouth 5 (five) times daily. Must last 30 days   oxyCODONE -acetaminophen  (PERCOCET) 7.5-325 MG tablet Take 1 tablet by mouth 5 (five) times daily. Must last 30 days   oxyCODONE -acetaminophen  (PERCOCET) 7.5-325 MG tablet Take 1 tablet by mouth 5 (five) times daily. Must last 30 days   [START ON 01/05/2024] oxyCODONE -acetaminophen  (PERCOCET) 7.5-325 MG tablet Take 1 tablet by mouth 5 (five) times daily. Must last 30 days   spironolactone  (ALDACTONE ) 25 MG tablet TAKE ONE-HALF TABLET BY MOUTH ONCE DAILY AS DIRECTED   [DISCONTINUED] DULoxetine  (CYMBALTA ) 60 MG capsule Take 1 capsule by mouth once daily   [DISCONTINUED] gabapentin  (NEURONTIN ) 300 MG capsule Take 1 capsule (300 mg total) by mouth 3 (three) times daily.   No facility-administered medications prior to visit.    Review of Systems  Constitutional: Negative.   HENT: Negative.  Negative for sore throat.   Eyes: Negative.   Respiratory: Negative.  Negative for cough and shortness of breath.   Cardiovascular: Negative.  Negative for chest pain, palpitations and leg swelling.  Gastrointestinal: Negative.  Negative for abdominal pain, constipation, diarrhea, heartburn, nausea and vomiting.  Genitourinary: Negative.  Negative for dysuria and flank pain.  Musculoskeletal:  Positive for back pain, joint pain and myalgias.  Skin: Negative.   Neurological: Negative.  Negative for dizziness and headaches.  Endo/Heme/Allergies: Negative.   Psychiatric/Behavioral:  Positive for depression. Negative for suicidal ideas. The patient is nervous/anxious.        Objective:   BP (!) 150/66   Pulse 87   Ht 5' (1.524 m)   Wt 125 lb 12.8 oz (57.1 kg)   SpO2 97%   BMI 24.57 kg/m   Vitals:   12/29/23 1333  BP: (!) 150/66  Pulse: 87  Height: 5' (1.524 m)  Weight: 125 lb 12.8 oz  (57.1 kg)  SpO2: 97%  BMI (Calculated): 24.57    Physical Exam Vitals and nursing note reviewed.  Constitutional:  Appearance: Normal appearance.  HENT:     Head: Normocephalic and atraumatic.     Nose: Nose normal.     Mouth/Throat:     Mouth: Mucous membranes are moist.     Pharynx: Oropharynx is clear.   Eyes:     Conjunctiva/sclera: Conjunctivae normal.     Pupils: Pupils are equal, round, and reactive to light.    Cardiovascular:     Rate and Rhythm: Normal rate and regular rhythm.     Pulses: Normal pulses.     Heart sounds: Normal heart sounds. No murmur heard. Pulmonary:     Effort: Pulmonary effort is normal.     Breath sounds: Normal breath sounds. No wheezing.  Abdominal:     General: Bowel sounds are normal.     Palpations: Abdomen is soft.     Tenderness: There is no abdominal tenderness. There is no right CVA tenderness or left CVA tenderness.   Musculoskeletal:        General: Normal range of motion.     Cervical back: Normal range of motion.     Right lower leg: No edema.     Left lower leg: No edema.   Skin:    General: Skin is warm and dry.   Neurological:     General: No focal deficit present.     Mental Status: She is alert and oriented to person, place, and time.   Psychiatric:        Mood and Affect: Mood normal.        Behavior: Behavior normal.      No results found for any visits on 12/29/23.  No results found for this or any previous visit (from the past 2160 hours).    Assessment & Plan:  Taper off Cymbalta , start Remeron.  Continue other medications. 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   Total time spent: 30 minutes  FERNAND FREDY RAMAN, MD  12/29/2023   This document may have been prepared by [facility] Voice Recognition software and as such may include unintentional dictation errors.

## 2024-01-12 ENCOUNTER — Encounter: Payer: Self-pay | Admitting: Internal Medicine

## 2024-01-12 ENCOUNTER — Ambulatory Visit (INDEPENDENT_AMBULATORY_CARE_PROVIDER_SITE_OTHER): Payer: PRIVATE HEALTH INSURANCE | Admitting: Internal Medicine

## 2024-01-12 VITALS — BP 140/58 | HR 80 | Ht 60.0 in | Wt 129.8 lb

## 2024-01-12 DIAGNOSIS — I10 Essential (primary) hypertension: Secondary | ICD-10-CM | POA: Diagnosis not present

## 2024-01-12 DIAGNOSIS — E782 Mixed hyperlipidemia: Secondary | ICD-10-CM | POA: Diagnosis not present

## 2024-01-12 DIAGNOSIS — F32A Depression, unspecified: Secondary | ICD-10-CM

## 2024-01-12 DIAGNOSIS — F411 Generalized anxiety disorder: Secondary | ICD-10-CM

## 2024-01-12 NOTE — Progress Notes (Signed)
 Established Patient Office Visit  Subjective:  Patient ID: Jacqueline Thornton, female    DOB: 1951/02/05  Age: 73 y.o. MRN: [id number]  Chief Complaint  Patient presents with   Follow-up    2 week follow up    Patient comes in for follow-up today.  She has been tapered off Cymbalta  and now taking Remeron  at bedtime.  She is tolerating it well and is able to get some sleep also.  Will continue at the current dose for few more weeks.  Denies nausea vomiting, no chest pain no shortness of breath.  Her blood pressure is above normal but better than before.  Will continue to monitor and may consider increasing the dose of enalapril  at next visit.    No other concerns at this time.   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   Atypical face pain 04/24/2015   Back pain    CAD (coronary artery disease)    pt denies   Cancer (HCC)    squamous cell- R temple    Chronic back pain    Constipation    Depression    Fibromyalgia    GERD (gastroesophageal reflux disease)    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) 04/11/2015   H/O: hysterectomy 1979   Heart murmur    Hyperlipidemia    Hypertension    Low back pain 04/06/2015   Lumbar radicular pain 04/18/2015   Migraine    Narrowing of intervertebral disc space 04/11/2015   Sacroiliac joint pain 04/11/2015   Spine disorder     Past Surgical History:  Procedure Laterality Date   ABDOMINAL EXPOSURE N/A 12/11/2018   Procedure: ABDOMINAL EXPOSURE;  Surgeon: Serene Gaile ORN, MD;  Location: [facility] OR;  Service: Vascular;  Laterality: N/A;   ABDOMINAL HYSTERECTOMY     ANTERIOR CERVICAL DECOMP/DISCECTOMY FUSION N/A 10/06/2020   Procedure: Cervical three -four Anterior cervical decompression/discectomy/fusion  with possible anchor c and exploration of fusion Cervical four to  Six;  Surgeon: Unice Pac, MD;  Location: [facility] OR;  Service: Neurosurgery;  Laterality: N/A;   ANTERIOR LUMBAR FUSION N/A 12/11/2018   Procedure: Lumbar five Sacral one Anterior lumbar interbody fusion;  Surgeon: Unice Pac, MD;  Location: [facility] OR;  Service: Neurosurgery;  Laterality: N/A;   APPENDECTOMY     Age19   caract surger     02/24/17   CHOLECYSTECTOMY     COLONOSCOPY WITH PROPOFOL  N/A 07/19/2015   Procedure: COLONOSCOPY WITH PROPOFOL ;  Surgeon: Lamar ONEIDA Holmes, MD;  Location: [facility] ENDOSCOPY;  Service: Endoscopy;  Laterality: N/A;   COLONOSCOPY WITH PROPOFOL  N/A 09/11/2021   Procedure: COLONOSCOPY WITH PROPOFOL ;  Surgeon: Maryruth Ole ONEIDA, MD;  Location: [facility] ENDOSCOPY;  Service: Endoscopy;  Laterality: N/A;   COLONOSCOPY WITH PROPOFOL  N/A 03/05/2022   Procedure: COLONOSCOPY WITH PROPOFOL ;  Surgeon: Maryruth Ole ONEIDA, MD;  Location: [facility] ENDOSCOPY;  Service: Endoscopy;  Laterality: N/A;   CYSTOSCOPY  03/03/2018   Procedure: CYSTOSCOPY;  Surgeon: Leonce Garnette BIRCH, MD;  Location: [facility] ORS;  Service: Gynecology;;   ESOPHAGOGASTRODUODENOSCOPY (EGD) WITH PROPOFOL  N/A 09/11/2021   Procedure: ESOPHAGOGASTRODUODENOSCOPY (EGD) WITH PROPOFOL ;  Surgeon: Maryruth Ole ONEIDA, MD;  Location: [facility] ENDOSCOPY;  Service: Endoscopy;  Laterality: N/A;   LAPAROSCOPIC SALPINGO OOPHERECTOMY Bilateral 03/03/2018   Procedure: LAPAROSCOPIC SALPINGO OOPHORECTOMY;  Surgeon: Leonce,  Garnette BIRCH, MD;  Location: [facility] ORS;  Service: Gynecology;  Laterality: Bilateral;   NECK SURGERY     Age 89 and 41.   TOE FUSION Right    TONSILLECTOMY     Age 74   TRANSFORAMINAL LUMBAR INTERBODY FUSION W/ MIS 1 LEVEL Right 04/30/2022   Procedure: Minimally Invasive  Decompression and  Transforaminal Lumbar Interbody Fusion Lumbar Four-Five;  Surgeon: Dawley, Lani BROCKS, DO;  Location: [facility] OR;  Service: Neurosurgery;  Laterality: Right;  3C   TUMOR REMOVAL     Ovaries    Social History   Socioeconomic History   Marital  status: Married    Spouse name: Not on file   Number of children: Not on file   Years of education: Not on file   Highest education level: Not on file  Occupational History   Not on file  Tobacco Use   Smoking status: Former    Current packs/day: 0.00    Types: Cigarettes    Quit date: 07/02/2003    Years since quitting: 20.5   Smokeless tobacco: Never  Vaping Use   Vaping status: Never Used  Substance and Sexual Activity   Alcohol use: No    Alcohol/week: 0.0 standard drinks of alcohol   Drug use: No   Sexual activity: Not Currently  Other Topics Concern   Not on file  Social History Narrative   Not on file   Social Drivers of Health   Financial Resource Strain: Low Risk  (06/17/2017)   Overall Financial Resource Strain (CARDIA)    Difficulty of Paying Living Expenses: Not hard at all  Food Insecurity: No Food Insecurity (02/28/2022)   Hunger Vital Sign    Worried About Running Out of Food in the Last Year: Never true    Ran Out of Food in the Last Year: Never true  Transportation Needs: No Transportation Needs (02/28/2022)   PRAPARE - Administrator, Civil Service (Medical): No    Lack of Transportation (Non-Medical): No  Physical Activity: Unknown (06/17/2017)   Exercise Vital Sign    Days of Exercise per Week: Patient declined    Minutes of Exercise per Session: Patient declined  Stress: No Stress Concern Present (06/17/2017)   [employer] of Occupational Health - Occupational Stress Questionnaire    Feeling of Stress : Not at all  Social Connections: Unknown (06/17/2017)   Social Connection and Isolation Panel    Frequency of Communication with Friends and Family: Patient declined    Frequency of Social Gatherings with Friends and Family: Patient declined    Attends Religious Services: Patient declined    Database administrator or Organizations: Patient declined    Attends Banker Meetings: Patient declined    Marital Status: Patient  declined  Intimate Partner Violence: Unknown (06/17/2017)   Humiliation, Afraid, Rape, and Kick questionnaire    Fear of Current or Ex-Partner: Patient declined    Emotionally Abused: Patient declined    Physically Abused: Patient declined    Sexually Abused: Patient declined    Family History  Problem Relation Age of Onset   Diabetes Father    Hypertension Sister    Diabetes Mother    Stroke Mother    Colon cancer Mother    Colon cancer Maternal Grandmother    Ovarian cancer Neg Hx    Breast cancer Neg Hx     No Known Allergies  Outpatient Medications Prior to Visit  Medication Sig   atorvastatin  (LIPITOR) 20  MG tablet Take 1 tablet by mouth once daily   Calcium -Vitamin D -Vitamin K (VIACTIV CALCIUM  PLUS D) 650-12.5-40 MG-MCG-MCG CHEW Chew 1 tablet by mouth in the morning and at bedtime. Morning & supper   docusate sodium  (COLACE) 100 MG capsule Take 100 mg by mouth 2 (two) times daily.   enalapril  (VASOTEC ) 5 MG tablet Take 1 tablet by mouth once daily   estradiol  (ESTRACE ) 0.5 MG tablet Take 1 tablet by mouth once daily   furosemide  (LASIX ) 20 MG tablet Take 20 mg by mouth daily with lunch.   gabapentin  (NEURONTIN ) 300 MG capsule Take 1 capsule (300 mg total) by mouth 3 (three) times daily.   Magnesium 200 MG TABS Take 1 tablet by mouth at bedtime.   Melatonin 10 MG TABS Take 10 mg by mouth at bedtime.   methocarbamol  (ROBAXIN ) 500 MG tablet Take 1 tablet (500 mg total) by mouth 4 (four) times daily.   mirtazapine  (REMERON  SOL-TAB) 30 MG disintegrating tablet Take 1 tablet (30 mg total) by mouth at bedtime.   Multiple Vitamin (MULTIVITAMIN WITH MINERALS) TABS tablet Take 1 tablet by mouth daily after supper. Women's Multivitamin (Gummy)   naloxone  (NARCAN ) nasal spray 4 mg/0.1 mL Place 1 spray into the nose as needed for up to 365 doses (for opioid-induced respiratory depresssion). In case of emergency (overdose), spray once into each nostril. If no response within 3 minutes,  repeat application and call 911.   omeprazole (PRILOSEC) 40 MG capsule Take 40 mg by mouth daily before breakfast.   ondansetron  (ZOFRAN -ODT) 4 MG disintegrating tablet Take 1 tablet (4 mg total) by mouth every 8 (eight) hours as needed for nausea or vomiting.   oxyCODONE -acetaminophen  (PERCOCET) 7.5-325 MG tablet Take 1 tablet by mouth 5 (five) times daily. Must last 30 days   oxyCODONE -acetaminophen  (PERCOCET) 7.5-325 MG tablet Take 1 tablet by mouth 5 (five) times daily. Must last 30 days   oxyCODONE -acetaminophen  (PERCOCET) 7.5-325 MG tablet Take 1 tablet by mouth 5 (five) times daily. Must last 30 days   oxyCODONE -acetaminophen  (PERCOCET) 7.5-325 MG tablet Take 1 tablet by mouth 5 (five) times daily. Must last 30 days   spironolactone  (ALDACTONE ) 25 MG tablet TAKE ONE-HALF TABLET BY MOUTH ONCE DAILY AS DIRECTED   [DISCONTINUED] DULoxetine  (CYMBALTA ) 30 MG capsule Take 1 capsule (30 mg total) by mouth daily.   No facility-administered medications prior to visit.    Review of Systems  Constitutional: Negative.  Negative for chills, diaphoresis, fever, malaise/fatigue and weight loss.  HENT: Negative.  Negative for sore throat.   Eyes: Negative.   Respiratory: Negative.  Negative for cough and shortness of breath.   Cardiovascular: Negative.  Negative for chest pain, palpitations and leg swelling.  Gastrointestinal: Negative.  Negative for abdominal pain, constipation, diarrhea, heartburn, nausea and vomiting.  Genitourinary: Negative.  Negative for dysuria and flank pain.  Musculoskeletal: Negative.  Negative for joint pain and myalgias.  Skin: Negative.   Neurological: Negative.  Negative for dizziness, tingling, tremors and headaches.  Endo/Heme/Allergies: Negative.   Psychiatric/Behavioral: Negative.  Negative for depression and suicidal ideas. The patient is not nervous/anxious.        Objective:   BP (!) 140/58   Pulse 80   Ht 5' (1.524 m)   Wt 129 lb 12.8 oz (58.9 kg)    SpO2 98%   BMI 25.35 kg/m   Vitals:   01/12/24 1417  BP: (!) 140/58  Pulse: 80  Height: 5' (1.524 m)  Weight: 129 lb 12.8 oz (58.9  kg)  SpO2: 98%  BMI (Calculated): 25.35    Physical Exam Vitals and nursing note reviewed.  Constitutional:      Appearance: Normal appearance.  HENT:     Head: Normocephalic and atraumatic.     Nose: Nose normal.     Mouth/Throat:     Mouth: Mucous membranes are moist.     Pharynx: Oropharynx is clear.  Eyes:     Conjunctiva/sclera: Conjunctivae normal.     Pupils: Pupils are equal, round, and reactive to light.  Cardiovascular:     Rate and Rhythm: Normal rate and regular rhythm.     Pulses: Normal pulses.     Heart sounds: Normal heart sounds. No murmur heard. Pulmonary:     Effort: Pulmonary effort is normal.     Breath sounds: Normal breath sounds. No wheezing.  Abdominal:     General: Bowel sounds are normal.     Palpations: Abdomen is soft.     Tenderness: There is no abdominal tenderness. There is no right CVA tenderness or left CVA tenderness.  Musculoskeletal:        General: Normal range of motion.     Cervical back: Normal range of motion.     Right lower leg: No edema.     Left lower leg: No edema.  Skin:    General: Skin is warm and dry.  Neurological:     General: No focal deficit present.     Mental Status: She is alert and oriented to person, place, and time.  Psychiatric:        Mood and Affect: Mood normal.        Behavior: Behavior normal.      No results found for any visits on 01/12/24.  No results found for this or any previous visit (from the past 2160 hours).    Assessment & Plan:  Continue Remeron  at 30 mg at bedtime.  Monitor blood pressure at home as well.  Will adjust medications if needed. Problem List Items Addressed This Visit     Hyperlipidemia   Anxiety, generalized   Essential hypertension, benign - Primary   Other Visit Diagnoses       Depression, unspecified depression type            Follow up one month.  Total time spent: 30 minutes  FERNAND FREDY RAMAN, MD  01/12/2024   This document may have been prepared by [facility] Voice Recognition software and as such may include unintentional dictation errors.

## 2024-01-14 ENCOUNTER — Other Ambulatory Visit: Payer: Self-pay | Admitting: Cardiology

## 2024-01-16 ENCOUNTER — Ambulatory Visit: Payer: PRIVATE HEALTH INSURANCE | Admitting: Cardiovascular Disease

## 2024-01-21 ENCOUNTER — Telehealth: Payer: Self-pay | Admitting: *Deleted

## 2024-01-21 NOTE — Telephone Encounter (Signed)
   Name: Jacqueline Thornton  DOB: 18-Apr-1951  MRN: [id number]  Primary Cardiologist: None  Chart reviewed as part of pre-operative protocol coverage. Because of Jacqueline Thornton's past medical history and time since last visit, she will require a follow-up in-office visit in order to better assess preoperative cardiovascular risk. Has not been seen by cardiologist in our practice. Will need to be established.  Needs appt ASAP as surgery is scheduled for 01/23/2024 unless can be delayed.   Pre-op covering staff: - Please schedule appointment and call patient to inform them. If patient already had an upcoming appointment within acceptable timeframe, please add pre-op clearance to the appointment notes so provider is aware. - Please contact requesting surgeon's office via preferred method (i.e, phone, fax) to inform them of need for appointment prior to surgery.    Jacqueline Satterfield, NP  01/21/2024, 4:06 PM

## 2024-01-21 NOTE — Telephone Encounter (Signed)
 Tried to call the pt to schedule a NEW PT APPT FOR PREOP CLEARANCE. While I was leaving a message a message, sounded like someone tried to pick up the phone but then was disconnected.

## 2024-01-21 NOTE — Telephone Encounter (Signed)
   Pre-operative Risk Assessment    Patient Name: Jacqueline Thornton  DOB: [birth year] MRN: [id number]   Date of last office visit: NONE Date of next office visit: NONE; PT WILL NEED A NEW PT APPT FOR PREOP CLEARANCE   Request for Surgical Clearance    Procedure:  LEFT SIDED SACROILIAC JOINT FUSION  Date of Surgery:  Clearance 01/27/24                                Surgeon:  DR. MARK DUMONSKI Surgeon's Group or Practice Name:  [facility] Phone number:  [phone] DONNA McCAIN Fax number:  [phone]   Type of Clearance Requested:   - Medical ; NONE INDICATED ON FORM TO BE HELD   Type of Anesthesia:  Not Indicated (GENERAL ?)   Additional requests/questions:    [name]   01/21/2024, 3:58 PM

## 2024-01-22 ENCOUNTER — Encounter: Payer: Self-pay | Admitting: Cardiovascular Disease

## 2024-01-22 ENCOUNTER — Ambulatory Visit (INDEPENDENT_AMBULATORY_CARE_PROVIDER_SITE_OTHER): Admitting: Cardiovascular Disease

## 2024-01-22 VITALS — BP 128/56 | HR 56 | Ht 60.0 in | Wt 127.8 lb

## 2024-01-22 DIAGNOSIS — E782 Mixed hyperlipidemia: Secondary | ICD-10-CM | POA: Diagnosis not present

## 2024-01-22 DIAGNOSIS — I10 Essential (primary) hypertension: Secondary | ICD-10-CM | POA: Diagnosis not present

## 2024-01-22 DIAGNOSIS — Z01810 Encounter for preprocedural cardiovascular examination: Secondary | ICD-10-CM | POA: Diagnosis not present

## 2024-01-22 DIAGNOSIS — I2510 Atherosclerotic heart disease of native coronary artery without angina pectoris: Secondary | ICD-10-CM | POA: Diagnosis not present

## 2024-01-22 DIAGNOSIS — I872 Venous insufficiency (chronic) (peripheral): Secondary | ICD-10-CM

## 2024-01-22 NOTE — Telephone Encounter (Signed)
 I s/w the surgery scheduler Arland Kirks for Dr. Beuford. I stated that we tried to reach the pt yesterday and she has not called back, however, I wanted to update as her surgery is 01/27/24.   I informed the surgery scheduler Arland that the pt is going to need a new pt appt. Arland, states the pt told her that she see's Dr. Elfreda Bathe with [facility]. I stated that I am unaware of that provider in our practice. Arland, said she is going to reach out to the pt for confirmation as to the cardiologist she states she see's. I told Arland, there are no appts or any notes in the chart from any of our cardiologist.   Arland said she will call back tomorrow and let me know what she finds out from the pt.   I did some google searches to see if I could find this Dr. Elfreda Bathe. The one that comes up for [place] is a pulmonologist in [employer], [place]. There is a Elfreda Bathe, MD however he is in [place] .

## 2024-01-22 NOTE — Telephone Encounter (Signed)
 I will update the surgery scheduler Arland with what I found for Dr. Elfreda Bathe.

## 2024-01-22 NOTE — Progress Notes (Signed)
 Cardiology Office Note   Date:  01/22/2024   ID:  Skylin, Kennerson 1951/04/30, MRN [id number]  PCP:  Fernand Fredy RAMAN, MD  Cardiologist:  Denyse Fernand, MD      History of Present Illness: Jacqueline Thornton is a 73 y.o. female who presents for  Chief Complaint  Patient presents with   Consult    Consult for Surg Clearance    Need clearance for sacroillac joint surgery. No chest pain or SOB.      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   Atypical face pain 04/24/2015   Back pain    CAD (coronary artery disease)    pt denies   Cancer (HCC)    squamous cell- R temple    Chronic back pain    Constipation    Depression    Fibromyalgia    GERD (gastroesophageal reflux disease)    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) 04/11/2015   H/O: hysterectomy 1979   Heart murmur    Hyperlipidemia    Hypertension    Low back pain 04/06/2015   Lumbar radicular pain 04/18/2015   Migraine    Narrowing of intervertebral disc space 04/11/2015   Sacroiliac joint pain 04/11/2015   Spine disorder      Past Surgical History:  Procedure Laterality Date   ABDOMINAL EXPOSURE N/A 12/11/2018   Procedure: ABDOMINAL EXPOSURE;  Surgeon: Serene Gaile ORN, MD;  Location: [facility] OR;  Service: Vascular;  Laterality: N/A;   ABDOMINAL HYSTERECTOMY     ANTERIOR CERVICAL DECOMP/DISCECTOMY FUSION N/A 10/06/2020   Procedure: Cervical three -four Anterior cervical decompression/discectomy/fusion  with possible anchor c and exploration of fusion Cervical four to Six;  Surgeon: Unice Pac, MD;  Location: [facility] OR;  Service: Neurosurgery;  Laterality: N/A;   ANTERIOR LUMBAR FUSION N/A 12/11/2018   Procedure: Lumbar five Sacral one Anterior lumbar interbody fusion;  Surgeon: Unice Pac, MD;  Location: [facility] OR;  Service:  Neurosurgery;  Laterality: N/A;   APPENDECTOMY     Age19   caract surger     02/24/17   CHOLECYSTECTOMY     COLONOSCOPY WITH PROPOFOL  N/A 07/19/2015   Procedure: COLONOSCOPY WITH PROPOFOL ;  Surgeon: Lamar ONEIDA Holmes, MD;  Location: [facility] ENDOSCOPY;  Service: Endoscopy;  Laterality: N/A;   COLONOSCOPY WITH PROPOFOL  N/A 09/11/2021   Procedure: COLONOSCOPY WITH PROPOFOL ;  Surgeon: Maryruth Ole ONEIDA, MD;  Location: [facility] ENDOSCOPY;  Service: Endoscopy;  Laterality: N/A;   COLONOSCOPY WITH PROPOFOL  N/A 03/05/2022   Procedure: COLONOSCOPY WITH PROPOFOL ;  Surgeon: Maryruth Ole ONEIDA, MD;  Location: [facility] ENDOSCOPY;  Service: Endoscopy;  Laterality: N/A;   CYSTOSCOPY  03/03/2018   Procedure: CYSTOSCOPY;  Surgeon: Leonce Garnette BIRCH, MD;  Location: [facility] ORS;  Service: Gynecology;;   ESOPHAGOGASTRODUODENOSCOPY (EGD) WITH PROPOFOL  N/A 09/11/2021   Procedure: ESOPHAGOGASTRODUODENOSCOPY (EGD) WITH PROPOFOL ;  Surgeon: Maryruth Ole ONEIDA, MD;  Location: [facility] ENDOSCOPY;  Service: Endoscopy;  Laterality: N/A;   LAPAROSCOPIC SALPINGO OOPHERECTOMY Bilateral 03/03/2018   Procedure: LAPAROSCOPIC SALPINGO OOPHORECTOMY;  Surgeon: Leonce Garnette BIRCH, MD;  Location: [facility] ORS;  Service: Gynecology;  Laterality: Bilateral;   NECK SURGERY     Age 72 and 49.   TOE FUSION Right    TONSILLECTOMY     Age 40   TRANSFORAMINAL LUMBAR INTERBODY FUSION W/  MIS 1 LEVEL Right 04/30/2022   Procedure: Minimally Invasive  Decompression and  Transforaminal Lumbar Interbody Fusion Lumbar Four-Five;  Surgeon: Dawley, Lani BROCKS, DO;  Location: [facility] OR;  Service: Neurosurgery;  Laterality: Right;  3C   TUMOR REMOVAL     Ovaries     Current Outpatient Medications  Medication Sig Dispense Refill   atorvastatin  (LIPITOR) 20 MG tablet Take 1 tablet by mouth once daily 90 tablet 0   Calcium -Vitamin D -Vitamin K (VIACTIV CALCIUM  PLUS D) 650-12.5-40 MG-MCG-MCG CHEW Chew 1 tablet by mouth in the morning and at bedtime. Morning & supper      docusate sodium  (COLACE) 100 MG capsule Take 100 mg by mouth 2 (two) times daily.     enalapril  (VASOTEC ) 5 MG tablet Take 1 tablet by mouth once daily 90 tablet 0   estradiol  (ESTRACE ) 0.5 MG tablet Take 1 tablet by mouth once daily 90 tablet 0   furosemide  (LASIX ) 20 MG tablet Take 20 mg by mouth daily with lunch.     gabapentin  (NEURONTIN ) 300 MG capsule Take 1 capsule (300 mg total) by mouth 3 (three) times daily. 180 capsule 3   Magnesium 200 MG TABS Take 1 tablet by mouth at bedtime.     Melatonin 10 MG TABS Take 10 mg by mouth at bedtime.     methocarbamol  (ROBAXIN ) 500 MG tablet Take 1 tablet (500 mg total) by mouth 4 (four) times daily. 90 tablet 0   mirtazapine  (REMERON  SOL-TAB) 30 MG disintegrating tablet Take 1 tablet (30 mg total) by mouth at bedtime. 30 tablet 6   Multiple Vitamin (MULTIVITAMIN WITH MINERALS) TABS tablet Take 1 tablet by mouth daily after supper. Women's Multivitamin (Gummy)     naloxone  (NARCAN ) nasal spray 4 mg/0.1 mL Place 1 spray into the nose as needed for up to 365 doses (for opioid-induced respiratory depresssion). In case of emergency (overdose), spray once into each nostril. If no response within 3 minutes, repeat application and call 911. 1 each 0   ondansetron  (ZOFRAN -ODT) 4 MG disintegrating tablet Take 1 tablet (4 mg total) by mouth every 8 (eight) hours as needed for nausea or vomiting. 20 tablet 0   oxyCODONE -acetaminophen  (PERCOCET) 7.5-325 MG tablet Take 1 tablet by mouth 5 (five) times daily. Must last 30 days 150 tablet 0   spironolactone  (ALDACTONE ) 25 MG tablet TAKE ONE-HALF TABLET BY MOUTH ONCE DAILY AS DIRECTED 45 tablet 0   omeprazole (PRILOSEC) 40 MG capsule Take 40 mg by mouth daily before breakfast.     oxyCODONE -acetaminophen  (PERCOCET) 7.5-325 MG tablet Take 1 tablet by mouth 5 (five) times daily. Must last 30 days 150 tablet 0   oxyCODONE -acetaminophen  (PERCOCET) 7.5-325 MG tablet Take 1 tablet by mouth 5 (five) times daily. Must last 30  days 150 tablet 0   oxyCODONE -acetaminophen  (PERCOCET) 7.5-325 MG tablet Take 1 tablet by mouth 5 (five) times daily. Must last 30 days 150 tablet 0   No current facility-administered medications for this visit.    Allergies:   Patient has no known allergies.    Social History:   reports that she quit smoking about 20 years ago. Her smoking use included cigarettes. She has never used smokeless tobacco. She reports that she does not drink alcohol and does not use drugs.   Family History:  family history includes Colon cancer in her maternal grandmother and mother; Diabetes in her father and mother; Hypertension in her sister; Stroke in her mother.    ROS:     Review  of Systems  Constitutional: Negative.   HENT: Negative.    Eyes: Negative.   Respiratory: Negative.    Gastrointestinal: Negative.   Genitourinary: Negative.   Musculoskeletal: Negative.   Skin: Negative.   Neurological: Negative.   Endo/Heme/Allergies: Negative.   Psychiatric/Behavioral: Negative.    All other systems reviewed and are negative.     All other systems are reviewed and negative.    PHYSICAL EXAM: VS:  BP (!) 128/56   Pulse (!) 56   Ht 5' (1.524 m)   Wt 127 lb 12.8 oz (58 kg)   SpO2 91%   BMI 24.96 kg/m  , BMI Body mass index is 24.96 kg/m. Last weight:  Wt Readings from Last 3 Encounters:  01/22/24 127 lb 12.8 oz (58 kg)  01/12/24 129 lb 12.8 oz (58.9 kg)  12/29/23 125 lb 12.8 oz (57.1 kg)     Physical Exam Constitutional:      Appearance: Normal appearance.  Cardiovascular:     Rate and Rhythm: Normal rate and regular rhythm.     Heart sounds: Normal heart sounds.  Pulmonary:     Effort: Pulmonary effort is normal.     Breath sounds: Normal breath sounds.  Musculoskeletal:     Right lower leg: No edema.     Left lower leg: No edema.  Neurological:     Mental Status: She is alert.       EKG:  NSR 65/MIN NO ACUTE CHANGES LAE. Recent Labs: 09/16/2023: ALT 9; BUN 8;  Creatinine, Ser 0.84; Hemoglobin 12.5; Platelets 194; Potassium 4.1; Sodium 142; TSH 2.000    Lipid Panel    Component Value Date/Time   CHOL 132 09/16/2023 0941   TRIG 89 09/16/2023 0941   HDL 50 09/16/2023 0941   CHOLHDL 2.6 09/16/2023 0941   LDLCALC 65 09/16/2023 0941      Other studies Reviewed: Additional studies/ records that were reviewed today include:  Review of the above records demonstrates:       No data to display            ASSESSMENT AND PLAN:    ICD-10-CM   1. Preoperative cardiovascular examination  Z01.810    EKG NORMAL. ASYMPTOMATIC. PROCEED WITH ABOVE SURGERY.    2. CAD in native artery  I25.10    Has no chest pain/SOB. EKG today NSR 65/min LAE, otherwise normal. LOW RISK FOR SURGERY.    3. Essential hypertension, benign  I10     4. Chronic venous insufficiency  I87.2     5. Mixed hyperlipidemia  E78.2        Problem List Items Addressed This Visit       Cardiovascular and Mediastinum   CAD in native artery   Chronic venous insufficiency   Essential hypertension, benign     Other   Hyperlipidemia   Other Visit Diagnoses       Preoperative cardiovascular examination    -  Primary   EKG NORMAL. ASYMPTOMATIC. PROCEED WITH ABOVE SURGERY.          Disposition:   Return in about 3 months (around 04/23/2024).    Total time spent: 40 minutes  Signed,  Denyse Bathe, MD  01/22/2024 12:00 PM    [facility]

## 2024-01-26 NOTE — Telephone Encounter (Signed)
 Late entry: [place]. Surgery scheduler with [facility], left me a vm on Friday 01/23/24 to disregard the clearance request as she s/w the pt and was give the correct doctor where to send the clearance request.   I will tell my preop APP as well to disregard request.

## 2024-02-02 ENCOUNTER — Ambulatory Visit: Attending: Nurse Practitioner | Admitting: Nurse Practitioner

## 2024-02-02 ENCOUNTER — Encounter: Payer: Self-pay | Admitting: Nurse Practitioner

## 2024-02-02 DIAGNOSIS — M545 Low back pain, unspecified: Secondary | ICD-10-CM | POA: Diagnosis present

## 2024-02-02 DIAGNOSIS — M25552 Pain in left hip: Secondary | ICD-10-CM | POA: Diagnosis present

## 2024-02-02 DIAGNOSIS — M47816 Spondylosis without myelopathy or radiculopathy, lumbar region: Secondary | ICD-10-CM | POA: Diagnosis present

## 2024-02-02 DIAGNOSIS — G894 Chronic pain syndrome: Secondary | ICD-10-CM

## 2024-02-02 DIAGNOSIS — M7061 Trochanteric bursitis, right hip: Secondary | ICD-10-CM

## 2024-02-02 DIAGNOSIS — M961 Postlaminectomy syndrome, not elsewhere classified: Secondary | ICD-10-CM

## 2024-02-02 DIAGNOSIS — Z79899 Other long term (current) drug therapy: Secondary | ICD-10-CM | POA: Diagnosis present

## 2024-02-02 DIAGNOSIS — M7062 Trochanteric bursitis, left hip: Secondary | ICD-10-CM

## 2024-02-02 DIAGNOSIS — M533 Sacrococcygeal disorders, not elsewhere classified: Secondary | ICD-10-CM | POA: Diagnosis present

## 2024-02-02 DIAGNOSIS — M25551 Pain in right hip: Secondary | ICD-10-CM | POA: Diagnosis present

## 2024-02-02 DIAGNOSIS — M542 Cervicalgia: Secondary | ICD-10-CM | POA: Diagnosis present

## 2024-02-02 DIAGNOSIS — M79604 Pain in right leg: Secondary | ICD-10-CM | POA: Diagnosis present

## 2024-02-02 DIAGNOSIS — Z79891 Long term (current) use of opiate analgesic: Secondary | ICD-10-CM

## 2024-02-02 DIAGNOSIS — G8929 Other chronic pain: Secondary | ICD-10-CM | POA: Diagnosis present

## 2024-02-02 DIAGNOSIS — M79605 Pain in left leg: Secondary | ICD-10-CM

## 2024-02-02 MED ORDER — OXYCODONE-ACETAMINOPHEN 7.5-325 MG PO TABS
1.0000 | ORAL_TABLET | Freq: Every day | ORAL | 0 refills | Status: DC
Start: 2024-03-05 — End: 2024-05-03

## 2024-02-02 MED ORDER — OXYCODONE-ACETAMINOPHEN 7.5-325 MG PO TABS: 1.0000 | ORAL_TABLET | Freq: Every day | ORAL | 0 refills | Status: DC

## 2024-02-02 NOTE — Progress Notes (Signed)
 Nursing Pain Medication Assessment:  Safety precautions to be maintained throughout the outpatient stay will include: orient to surroundings, keep bed in low position, maintain call bell within reach at all times, provide assistance with transfer out of bed and ambulation.  Medication Inspection Compliance: Pill count conducted under aseptic conditions, in front of the patient. Neither the pills nor the bottle was removed from the patient's sight at any time. Once count was completed pills were immediately returned to the patient in their original bottle.  Medication: Oxycodone /APAP Pill/Patch Count: 9 of 150 pills/patches remain Pill/Patch Appearance: Markings consistent with prescribed medication Bottle Appearance: Standard pharmacy container. Clearly labeled. Filled Date: 7 / 7 / 2025 Last Medication intake:  TodaySafety precautions to be maintained throughout the outpatient stay will include: orient to surroundings, keep bed in low position, maintain call bell within reach at all times, provide assistance with transfer out of bed and ambulation.

## 2024-02-02 NOTE — Progress Notes (Signed)
 PROVIDER NOTE: Interpretation of information contained herein should be left to medically-trained personnel. Specific patient instructions are provided elsewhere under Patient Instructions section of medical record. This document was created in part using AI and STT-dictation technology, any transcriptional errors that may result from this process are unintentional.  Patient: Jacqueline Thornton  Service: E/M   PCP: Fernand Fredy RAMAN, MD  DOB: [birth year]  DOS: 02/02/2024  Provider: Emmy MARLA Blanch, NP  MRN: [id number]  Delivery: Face-to-face  Specialty: Interventional Pain Management  Type: Established Patient  Setting: Ambulatory outpatient facility  Specialty designation: 09  Referring Prov.: Fernand Fredy RAMAN, MD  Location: Outpatient office facility       History of present illness (HPI) Ms. GWENDY BOEDER, a 73 y.o. year old female, is here today because of her No primary diagnosis found.. Ms. Blough's primary complain today is Back Pain (lower)  Pertinent problems: Ms. Bevard has has Chronic low back pain (Bilateral) (R>L) w/o sciatica; Spondylosis of lumbar spine; Headache, migraine; Lumbar annular disc tear (L4-L5); Discogenic low back pain (L3-L4 and L4-L5); Lumbar facet hypertrophy; Lumbar facet syndrome (Bilateral) (R>L); Chronic neck pain (Left); Cervical spondylosis; History of cervical spine surgery; Trochanteric bursitis of hip (Right); Neurogenic pain; Myofascial pain syndrome; and Chronic pain syndrome on their pertinent problem list  Pain Assessment: Severity of Chronic pain is reported as a 4 /10. Location: Back Right, Lower/pain radiaties down both hip, and leg. Onset: More than a month ago. Quality: Aching, Burning, Constant, Stabbing. Timing: Constant. Modifying factor(s): Meds, laying down. Vitals:  height is 5' (1.524 m) and weight is 127 lb (57.6 kg). Her temperature is 97.3 F (36.3 C) (abnormal). Her blood pressure is 113/60 and her pulse is 79. Her oxygen saturation is 100%.  BMI:  Estimated body mass index is 24.8 kg/m as calculated from the following:   Height as of this encounter: 5' (1.524 m).   Weight as of this encounter: 127 lb (57.6 kg).  Last encounter: 11/03/2023 Last procedure: Visit date not found.  Reason for encounter: medication management.  The patient indicates doing well with current medication regimen.  No adverse reaction or side effects reported to medication.  The patient underwent a low back surgery on January 27, 2024 at [facility] orthopedic and is currently recovering from the surgical intervention.  Pharmacotherapy Assessment   Oxycodone -acetaminophen  (Percocet) 7.5-325 mg tablet 5 times daily as needed for pain. MME=56.25 Monitoring: [place] PMP: PDMP reviewed during this encounter.       Pharmacotherapy: No side-effects or adverse reactions reported. Compliance: No problems identified. Effectiveness: Clinically acceptable.  Delores Dorothe LABOR, RN  02/02/2024  1:00 PM  Sign when Signing Visit Nursing Pain Medication Assessment:  Safety precautions to be maintained throughout the outpatient stay will include: orient to surroundings, keep bed in low position, maintain call bell within reach at all times, provide assistance with transfer out of bed and ambulation.  Medication Inspection Compliance: Pill count conducted under aseptic conditions, in front of the patient. Neither the pills nor the bottle was removed from the patient's sight at any time. Once count was completed pills were immediately returned to the patient in their original bottle.  Medication: Oxycodone /APAP Pill/Patch Count: 9 of 150 pills/patches remain Pill/Patch Appearance: Markings consistent with prescribed medication Bottle Appearance: Standard pharmacy container. Clearly labeled. Filled Date: 7 / 7 / 2025 Last Medication intake:  TodaySafety precautions to be maintained throughout the outpatient stay will include: orient to surroundings, keep bed in low position, maintain call  bell  within reach at all times, provide assistance with transfer out of bed and ambulation.     UDS:  Summary  Date Value Ref Range Status  01/01/2023 Note  Final    Comment:    ==================================================================== [employer] Select 13 (MW) ==================================================================== Test                             Result       Flag       Units  Drug Present and Declared for Prescription Verification   Oxycodone                       7012         EXPECTED   ng/mg creat   Oxymorphone                    2293         EXPECTED   ng/mg creat   Noroxycodone                   9132         EXPECTED   ng/mg creat   Noroxymorphone                 590          EXPECTED   ng/mg creat    Sources of oxycodone  are scheduled prescription medications.    Oxymorphone, noroxycodone, and noroxymorphone are expected    metabolites of oxycodone . Oxymorphone is also available as a    scheduled prescription medication.  ==================================================================== Test                      Result    Flag   Units      Ref Range   Creatinine              41               mg/dL      >=79 ==================================================================== Declared Medications:  The flagging and interpretation on this report are based on the  following declared medications.  Unexpected results may arise from  inaccuracies in the declared medications.   **Note: The testing scope of this panel includes these medications:   Oxycodone  (Percocet)   **Note: The testing scope of this panel does not include the  following reported medications:   Acetaminophen  (Percocet)  Atorvastatin  (Lipitor)  Calcium   Cyanocobalamin   Docusate (Colace)  Duloxetine  (Cymbalta )  Enalapril  (Vasotec )  Estradiol  (Estrace )  Furosemide  (Lasix )  Gabapentin  (Neurontin )  Magnesium  Melatonin  Methocarbamol  (Robaxin )  Multivitamin  Naloxone   (Narcan )  Omeprazole (Prilosec)  Spironolactone  (Aldactone )  Supplement  Vitamin D  ==================================================================== For clinical consultation, please call [phone]. ====================================================================     No results found for: CBDTHCR No results found for: D8THCCBX No results found for: D9THCCBX  ROS  Constitutional: Denies any fever or chills Gastrointestinal: No reported hemesis, hematochezia, vomiting, or acute GI distress Musculoskeletal: Low back pain Neurological: No reported episodes of acute onset apraxia, aphasia, dysarthria, agnosia, amnesia, paralysis, loss of coordination, or loss of consciousness  Medication Review  Calcium -Vitamin D -Vitamin K, Magnesium, Melatonin, atorvastatin , docusate sodium , enalapril , estradiol , furosemide , gabapentin , methocarbamol , mirtazapine , multivitamin with minerals, naloxone , omeprazole, ondansetron , oxyCODONE -acetaminophen , and spironolactone   History Review  Allergy: Ms. Armenteros has no known allergies. Drug: Ms. Breitenstein  reports no history of drug use. Alcohol:  reports no history of alcohol use. Tobacco:  reports that she quit smoking about 20 years ago. Her smoking use included cigarettes. She has never used smokeless tobacco. Social: Ms. Vohra  reports that she quit smoking about 20 years ago. Her smoking use included cigarettes. She has never used smokeless tobacco. She reports that she does not drink alcohol and does not use drugs. 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 Surgical: Ms. Takeda  has a past surgical history that includes Cholecystectomy; Abdominal hysterectomy; Neck surgery; Colonoscopy with propofol  (N/A, 07/19/2015); caract surger; Laparoscopic salpingo oophorectomy (Bilateral, 03/03/2018); Cystoscopy (03/03/2018); Tonsillectomy; Appendectomy; Tumor removal; Anterior lumbar fusion (N/A, 12/11/2018); Abdominal exposure (N/A, 12/11/2018); Anterior cervical decomp/discectomy fusion (N/A, 10/06/2020); Toe Fusion (Right); Esophagogastroduodenoscopy (egd) with propofol  (N/A, 09/11/2021); Colonoscopy with propofol  (N/A, 09/11/2021); Colonoscopy with propofol  (N/A, 03/05/2022); and Transforaminal lumbar interbody fusion w/ mis 1 level (Right, 04/30/2022). Family: family history includes Colon cancer in her maternal grandmother and mother; Diabetes in her father and mother; Hypertension in her sister; Stroke in her mother.  Laboratory Chemistry Profile   Renal Lab Results  Component Value Date   BUN 8 09/16/2023   CREATININE 0.84 09/16/2023   BCR 10 (L) 09/16/2023   GFRAA 47 (L) 12/16/2019   GFRNONAA >60 04/24/2022    Hepatic Lab Results  Component Value Date   AST 15 09/16/2023   ALT 9 09/16/2023   ALBUMIN 3.4 (L) 09/16/2023   ALKPHOS 56 09/16/2023    Electrolytes Lab Results  Component Value Date   NA 142 09/16/2023   K 4.1 09/16/2023   CL 104 09/16/2023   CALCIUM  9.0 09/16/2023   MG 2.4 (H) 12/16/2019    Bone Lab Results  Component Value Date   VD25OH 33.3 09/16/2023   25OHVITD1 53 12/16/2019   25OHVITD2 <1.0 12/16/2019   25OHVITD3 53 12/16/2019    Inflammation (CRP: Acute Phase) (ESR: Chronic Phase) Lab Results  Component Value Date   CRP 4 12/16/2019   ESRSEDRATE 6  12/16/2019         Note: Above Lab results reviewed.  Recent Imaging Review  CT BIOPSY CLINICAL DATA:  73 year old female with bilateral sacroiliac joint dysfunction. She received partial relief to prior bilateral sacroiliac joint injections with anesthetic only. She presents today for repeat therapeutic injection including anesthetic and steroid.  EXAM: BILATERAL CT GUIDED SI JOINT INJECTION  RADIATION DOSE REDUCTION: This exam was performed according to the departmental dose-optimization program which includes automated exposure control, adjustment of the mA and/or kV according to patient size and/or use of iterative reconstruction technique.  PROCEDURE: After a thorough discussion of risks and benefits of the procedure, including bleeding, infection, injury to nerves, blood vessels, and adjacent structures, verbal and written consent was obtained. Specific risks of the procedure included nondiagnostic/nontherapeutic injection and non target injection. The patient was placed prone on the CT table and localization was performed over the sacrum. Target site marked using CT guidance. The skin was prepped and draped in the usual sterile fashion using Betadine soap.  RIGHT SI JOINT  After local anesthesia with 1% lidocaine  without epinephrine  and subsequent deep  anesthesia, a 3-1/2 inch 22 gauge curved spinal needle was advanced into the right SI joint under intermittent CT guidance.  Once the needle was in satisfactory position, representative image was captured with the needle demonstrated in the sacroiliac joint. Subsequently, 40 mg Depo-Medrol  and 1 mL 0.5% bupivacaine  was injected into the right SI joint. Needles removed and a sterile dressing applied.  LEFT SI JOINT  After local anesthesia with 1% lidocaine  without epinephrine  and subsequent deep anesthesia, a 3-1/2 inch 22 gauge curved spinal needle was advanced into the right SI joint under intermittent  CT guidance.  Once the needle was in satisfactory position, representative image was captured with the needle demonstrated in the sacroiliac joint. Subsequently, 40 mg Depo-Medrol  and 1 mL 0.5% bupivacaine  was injected into the right SI joint. Needles removed and a sterile dressing applied.  No complications were observed.  IMPRESSION: Successful CT-guided bilateral SI joint injections of anesthetic and steroid.  Electronically Signed   By: Wilkie Lent M.D.   On: 12/19/2023 08:54 CT BIOPSY CLINICAL DATA:  73 year old female with bilateral sacroiliac joint dysfunction. She received partial relief to prior bilateral sacroiliac joint injections with anesthetic only. She presents today for repeat therapeutic injection including anesthetic and steroid.  EXAM: BILATERAL CT GUIDED SI JOINT INJECTION  RADIATION DOSE REDUCTION: This exam was performed according to the departmental dose-optimization program which includes automated exposure control, adjustment of the mA and/or kV according to patient size and/or use of iterative reconstruction technique.  PROCEDURE: After a thorough discussion of risks and benefits of the procedure, including bleeding, infection, injury to nerves, blood vessels, and adjacent structures, verbal and written consent was obtained. Specific risks of the procedure included nondiagnostic/nontherapeutic injection and non target injection. The patient was placed prone on the CT table and localization was performed over the sacrum. Target site marked using CT guidance. The skin was prepped and draped in the usual sterile fashion using Betadine soap.  RIGHT SI JOINT  After local anesthesia with 1% lidocaine  without epinephrine  and subsequent deep anesthesia, a 3-1/2 inch 22 gauge curved spinal needle was advanced into the right SI joint under intermittent CT guidance.  Once the needle was in satisfactory position, representative image was captured  with the needle demonstrated in the sacroiliac joint. Subsequently, 40 mg Depo-Medrol  and 1 mL 0.5% bupivacaine  was injected into the right SI joint. Needles removed and a sterile dressing applied.  LEFT SI JOINT  After local anesthesia with 1% lidocaine  without epinephrine  and subsequent deep anesthesia, a 3-1/2 inch 22 gauge curved spinal needle was advanced into the right SI joint under intermittent CT guidance.  Once the needle was in satisfactory position, representative image was captured with the needle demonstrated in the sacroiliac joint. Subsequently, 40 mg Depo-Medrol  and 1 mL 0.5% bupivacaine  was injected into the right SI joint. Needles removed and a sterile dressing applied.  No complications were observed.  IMPRESSION: Successful CT-guided bilateral SI joint injections of anesthetic and steroid.  Electronically Signed   By: Wilkie Lent M.D.   On: 12/19/2023 08:54 Note: Reviewed        Physical Exam  Vitals: BP 113/60   Pulse 79   Temp (!) 97.3 F (36.3 C)   Ht 5' (1.524 m)   Wt 127 lb (57.6 kg)   SpO2 100%   BMI 24.80 kg/m  BMI: Estimated body mass index is 24.8 kg/m as calculated from the following:   Height as of this encounter: 5' (1.524 m).   Weight as  of this encounter: 127 lb (57.6 kg). Ideal: Ideal body weight: 45.5 kg (100 lb 4.9 oz) Adjusted ideal body weight: 50.3 kg (110 lb 15.8 oz) General appearance: Well nourished, well developed, and well hydrated. In no apparent acute distress Mental status: Alert, oriented x 3 (person, place, & time)       Respiratory: No evidence of acute respiratory distress Eyes: PERLA   Assessment   Diagnosis Status  1. Chronic low back pain (Bilateral) (R>L) w/o sciatica   2. Chronic lower extremity pain (Bilateral) (R>L)   3. Chronic hip pain (Bilateral) (L>R)   4. Chronic sacroiliac joint pain (Bilateral) (L>R)   5. Chronic neck pain (Left)   6. Lumbar facet syndrome (Bilateral) (R>L)   7.  Trochanteric bursitis of hip (Bilateral) (L>R)   8. Failed back surgical syndrome   9. Chronic pain syndrome   10. Pharmacologic therapy   11. Chronic use of opiate for therapeutic purpose   12. Encounter for medication management   13. Encounter for chronic pain management    Controlled Controlled Controlled   Updated Problems: No problems updated.  Plan of Care  Problem-specific:  Assessment and Plan Will continue on current medication regiment.  Prescribing drug monitoring (PMP) reviewed; findings consistent with the use of prescribed medication and no evidence of narcotic misuse or abuse. Routine UDS ordered today.  No other new issues or concerns reported at this visit.  Schedule follow-up in 90 days for medication management.  Ms. KADIAN BARCELLOS has a current medication list which includes the following long-term medication(s): atorvastatin , viactiv calcium  plus d, enalapril , estradiol , furosemide , gabapentin , mirtazapine , naloxone , omeprazole, spironolactone , [START ON 02/04/2024] oxycodone -acetaminophen , [START ON 03/05/2024] oxycodone -acetaminophen , and [START ON 04/04/2024] oxycodone -acetaminophen .  Pharmacotherapy (Medications Ordered): Meds ordered this encounter  Medications   oxyCODONE -acetaminophen  (PERCOCET) 7.5-325 MG tablet    Sig: Take 1 tablet by mouth 5 (five) times daily. Must last 30 days    Dispense:  150 tablet    Refill:  0    DO NOT: delete (not duplicate); no partial-fill (will deny script to complete), no refill request (F/U required). DISPENSE: 1 day early if closed on fill date. WARN: No CNS-depressants within 8 hrs of med.   oxyCODONE -acetaminophen  (PERCOCET) 7.5-325 MG tablet    Sig: Take 1 tablet by mouth 5 (five) times daily. Must last 30 days    Dispense:  150 tablet    Refill:  0    DO NOT: delete (not duplicate); no partial-fill (will deny script to complete), no refill request (F/U required). DISPENSE: 1 day early if closed on fill date. WARN: No  CNS-depressants within 8 hrs of med.   oxyCODONE -acetaminophen  (PERCOCET) 7.5-325 MG tablet    Sig: Take 1 tablet by mouth 5 (five) times daily. Must last 30 days    Dispense:  150 tablet    Refill:  0    DO NOT: delete (not duplicate); no partial-fill (will deny script to complete), no refill request (F/U required). DISPENSE: 1 day early if closed on fill date. WARN: No CNS-depressants within 8 hrs of med.   Orders:  Orders Placed This Encounter  Procedures   Tox[employer] Select 13 (MW), Urine    Volume: 30 ml(s). Minimum 3 ml of urine is needed. Document temperature of fresh sample. Indications: Long term (current) use of opiate analgesic (S20.108)    Release to patient:   Immediate        Return in about 3 months (around 05/04/2024) for (F2F), (MM), Emmy Blanch NP.  Recent Visits No visits were found meeting these conditions. Showing recent visits within past 90 days and meeting all other requirements Today's Visits Date Type Provider Dept  02/02/24 Office Visit Pranay Hilbun K, NP [facility]  Showing today's visits and meeting all other requirements Future Appointments No visits were found meeting these conditions. Showing future appointments within next 90 days and meeting all other requirements  I discussed the assessment and treatment plan with the patient. The patient was provided an opportunity to ask questions and all were answered. The patient agreed with the plan and demonstrated an understanding of the instructions.  Patient advised to call back or seek an in-person evaluation if the symptoms or condition worsens.  Duration of encounter: 30 minutes.  Total time on encounter, as per AMA guidelines included both the face-to-face and non-face-to-face time personally spent by the physician and/or other qualified health care professional(s) on the day of the encounter (includes time in activities that require the physician or other qualified health care  professional and does not include time in activities normally performed by clinical staff). Physician's time may include the following activities when performed: Preparing to see the patient (e.g., pre-charting review of records, searching for previously ordered imaging, lab work, and nerve conduction tests) Review of prior analgesic pharmacotherapies. Reviewing PMP Interpreting ordered tests (e.g., lab work, imaging, nerve conduction tests) Performing post-procedure evaluations, including interpretation of diagnostic procedures Obtaining and/or reviewing separately obtained history Performing a medically appropriate examination and/or evaluation Counseling and educating the patient/family/caregiver Ordering medications, tests, or procedures Referring and communicating with other health care professionals (when not separately reported) Documenting clinical information in the electronic or other health record Independently interpreting results (not separately reported) and communicating results to the patient/ family/caregiver Care coordination (not separately reported)  Note by: Aniza Shor K Mikah Poss, NP (TTS and AI technology used. I apologize for any typographical errors that were not detected and corrected.) Date: 02/02/2024; Time: 1:25 PM

## 2024-02-04 LAB — TOXASSURE SELECT 13 (MW), URINE

## 2024-02-12 ENCOUNTER — Ambulatory Visit: Payer: PRIVATE HEALTH INSURANCE | Admitting: Internal Medicine

## 2024-02-16 ENCOUNTER — Other Ambulatory Visit: Payer: Self-pay | Admitting: Internal Medicine

## 2024-02-16 DIAGNOSIS — I10 Essential (primary) hypertension: Secondary | ICD-10-CM

## 2024-02-22 ENCOUNTER — Other Ambulatory Visit: Payer: Self-pay | Admitting: Internal Medicine

## 2024-02-22 DIAGNOSIS — E782 Mixed hyperlipidemia: Secondary | ICD-10-CM

## 2024-03-02 ENCOUNTER — Ambulatory Visit (INDEPENDENT_AMBULATORY_CARE_PROVIDER_SITE_OTHER): Admitting: Internal Medicine

## 2024-03-02 ENCOUNTER — Ambulatory Visit: Payer: Self-pay | Admitting: Internal Medicine

## 2024-03-02 ENCOUNTER — Encounter: Payer: Self-pay | Admitting: Internal Medicine

## 2024-03-02 ENCOUNTER — Ambulatory Visit
Admission: RE | Admit: 2024-03-02 | Discharge: 2024-03-02 | Disposition: A | Discharge status: Home / Self Care | Source: Ambulatory Visit | Attending: Internal Medicine | Admitting: Internal Medicine

## 2024-03-02 VITALS — BP 112/52 | HR 69 | Ht 60.0 in | Wt 135.0 lb

## 2024-03-02 DIAGNOSIS — M461 Sacroiliitis, not elsewhere classified: Secondary | ICD-10-CM

## 2024-03-02 DIAGNOSIS — I10 Essential (primary) hypertension: Secondary | ICD-10-CM | POA: Diagnosis not present

## 2024-03-02 DIAGNOSIS — F411 Generalized anxiety disorder: Secondary | ICD-10-CM

## 2024-03-02 DIAGNOSIS — M533 Sacrococcygeal disorders, not elsewhere classified: Secondary | ICD-10-CM

## 2024-03-02 DIAGNOSIS — R0989 Other specified symptoms and signs involving the circulatory and respiratory systems: Secondary | ICD-10-CM | POA: Insufficient documentation

## 2024-03-02 DIAGNOSIS — E782 Mixed hyperlipidemia: Secondary | ICD-10-CM

## 2024-03-02 DIAGNOSIS — G8929 Other chronic pain: Secondary | ICD-10-CM

## 2024-03-02 DIAGNOSIS — I872 Venous insufficiency (chronic) (peripheral): Secondary | ICD-10-CM

## 2024-03-02 NOTE — Progress Notes (Signed)
 Established Patient Office Visit  Subjective:  Patient ID: Jacqueline Thornton, female    DOB: March 05, 1951  Age: 73 y.o. MRN: [id number]  Chief Complaint  Patient presents with   Follow-up    1 month follow up    Patient is seen today for follow up. She is UTD on mammogram and DEXA scan; both completed this year. Reports recent SI joint stabilization surgery 5 weeks ago. States the first 2 weeks she was completely non-weight bearing on the left side and recently was given weight bearing status in the last week. Since her surgery she has noticed left lower extremity edema and pain behind her knee. 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 Patient denies redness, warmth to touch. Will order STAT venous US  and D-dimer to determine if there is another DVT. Patient is not on prophylaxis anticoagulants. Overall, patient endorses improvement in her back pain and has no additional complaints. She states she is taking her medications as prescribed without any side effects.    No other concerns at this time.   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   Atypical face pain 04/24/2015   Back pain    CAD (coronary artery disease)    pt denies   Cancer (HCC)    squamous cell- R temple    Chronic back pain    Constipation    Depression    Fibromyalgia    GERD (gastroesophageal reflux disease)    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) 04/11/2015   H/O: hysterectomy 1979   Heart murmur    Hyperlipidemia    Hypertension    Low back pain 04/06/2015   Lumbar radicular pain 04/18/2015   Migraine    Narrowing of intervertebral disc space 04/11/2015   Sacroiliac joint pain 04/11/2015   Spine disorder     Past Surgical History:  Procedure Laterality Date   ABDOMINAL EXPOSURE N/A 12/11/2018   Procedure: ABDOMINAL  EXPOSURE;  Surgeon: Serene Gaile ORN, MD;  Location: [facility] OR;  Service: Vascular;  Laterality: N/A;   ABDOMINAL HYSTERECTOMY     ANTERIOR CERVICAL DECOMP/DISCECTOMY FUSION N/A 10/06/2020   Procedure: Cervical three -four Anterior cervical decompression/discectomy/fusion  with possible anchor c and exploration of fusion Cervical four to Six;  Surgeon: Unice Pac, MD;  Location: [facility] OR;  Service: Neurosurgery;  Laterality: N/A;   ANTERIOR LUMBAR FUSION N/A 12/11/2018   Procedure: Lumbar five Sacral one Anterior lumbar interbody fusion;  Surgeon: Unice Pac, MD;  Location: [facility] OR;  Service: Neurosurgery;  Laterality: N/A;   APPENDECTOMY     Age19   caract surger     02/24/17   CHOLECYSTECTOMY     COLONOSCOPY WITH PROPOFOL  N/A 07/19/2015   Procedure: COLONOSCOPY WITH PROPOFOL ;  Surgeon: Lamar ONEIDA Holmes, MD;  Location: [facility] ENDOSCOPY;  Service: Endoscopy;  Laterality: N/A;   COLONOSCOPY WITH PROPOFOL  N/A 09/11/2021   Procedure: COLONOSCOPY WITH PROPOFOL ;  Surgeon: Maryruth Ole ONEIDA, MD;  Location: [facility] ENDOSCOPY;  Service: Endoscopy;  Laterality: N/A;   COLONOSCOPY WITH PROPOFOL  N/A 03/05/2022   Procedure: COLONOSCOPY WITH PROPOFOL ;  Surgeon: Maryruth Ole ONEIDA, MD;  Location: [facility] ENDOSCOPY;  Service: Endoscopy;  Laterality: N/A;   CYSTOSCOPY  03/03/2018   Procedure: CYSTOSCOPY;  Surgeon: Leonce Garnette BIRCH, MD;  Location: [facility] ORS;  Service: Gynecology;;   ESOPHAGOGASTRODUODENOSCOPY (EGD) WITH PROPOFOL  N/A 09/11/2021   Procedure: ESOPHAGOGASTRODUODENOSCOPY (EGD) WITH PROPOFOL ;  Surgeon: Maryruth Ole DASEN, MD;  Location: [facility] ENDOSCOPY;  Service: Endoscopy;  Laterality: N/A;   LAPAROSCOPIC SALPINGO OOPHERECTOMY Bilateral 03/03/2018   Procedure: LAPAROSCOPIC SALPINGO OOPHORECTOMY;  Surgeon: Leonce Garnette BIRCH, MD;  Location: [facility] ORS;  Service: Gynecology;  Laterality: Bilateral;   NECK SURGERY     Age 64 and 41.   TOE FUSION Right    TONSILLECTOMY     Age 53   TRANSFORAMINAL LUMBAR  INTERBODY FUSION W/ MIS 1 LEVEL Right 04/30/2022   Procedure: Minimally Invasive  Decompression and  Transforaminal Lumbar Interbody Fusion Lumbar Four-Five;  Surgeon: Dawley, Lani BROCKS, DO;  Location: [facility] OR;  Service: Neurosurgery;  Laterality: Right;  3C   TUMOR REMOVAL     Ovaries    Social History   Socioeconomic History   Marital status: Married    Spouse name: Not on file   Number of children: Not on file   Years of education: Not on file   Highest education level: Not on file  Occupational History   Not on file  Tobacco Use   Smoking status: Former    Current packs/day: 0.00    Types: Cigarettes    Quit date: 07/02/2003    Years since quitting: 20.6   Smokeless tobacco: Never  Vaping Use   Vaping status: Never Used  Substance and Sexual Activity   Alcohol use: No    Alcohol/week: 0.0 standard drinks of alcohol   Drug use: No   Sexual activity: Not Currently  Other Topics Concern   Not on file  Social History Narrative   Not on file   Social Drivers of Health   Financial Resource Strain: Low Risk  (06/17/2017)   Overall Financial Resource Strain (CARDIA)    Difficulty of Paying Living Expenses: Not hard at all  Food Insecurity: No Food Insecurity (02/28/2022)   Hunger Vital Sign    Worried About Running Out of Food in the Last Year: Never true    Ran Out of Food in the Last Year: Never true  Transportation Needs: No Transportation Needs (02/28/2022)   PRAPARE - Administrator, Civil Service (Medical): No    Lack of Transportation (Non-Medical): No  Physical Activity: Unknown (06/17/2017)   Exercise Vital Sign    Days of Exercise per Week: Patient declined    Minutes of Exercise per Session: Patient declined  Stress: No Stress Concern Present (06/17/2017)   [employer] of Occupational Health - Occupational Stress Questionnaire    Feeling of Stress : Not at all  Social Connections: Unknown (06/17/2017)   Social Connection and Isolation Panel     Frequency of Communication with Friends and Family: Patient declined    Frequency of Social Gatherings with Friends and Family: Patient declined    Attends Religious Services: Patient declined    Database administrator or Organizations: Patient declined    Attends Banker Meetings: Patient declined    Marital Status: Patient declined  Intimate Partner Violence: Unknown (06/17/2017)   Humiliation, Afraid, Rape, and Kick questionnaire    Fear of Current or Ex-Partner: Patient declined    Emotionally Abused: Patient declined    Physically Abused: Patient declined    Sexually Abused: Patient declined    Family History  Problem Relation Age of Onset   Diabetes Father    Hypertension Sister    Diabetes Mother    Stroke  Mother    Colon cancer Mother    Colon cancer Maternal Grandmother    Ovarian cancer Neg Hx    Breast cancer Neg Hx     No Known Allergies  Outpatient Medications Prior to Visit  Medication Sig   atorvastatin  (LIPITOR) 20 MG tablet Take 1 tablet by mouth once daily   Calcium -Vitamin D -Vitamin K (VIACTIV CALCIUM  PLUS D) 650-12.5-40 MG-MCG-MCG CHEW Chew 1 tablet by mouth in the morning and at bedtime. Morning & supper   docusate sodium  (COLACE) 100 MG capsule Take 100 mg by mouth 2 (two) times daily.   enalapril  (VASOTEC ) 5 MG tablet Take 1 tablet by mouth once daily   estradiol  (ESTRACE ) 0.5 MG tablet Take 1 tablet by mouth once daily   furosemide  (LASIX ) 20 MG tablet Take 20 mg by mouth daily with lunch.   gabapentin  (NEURONTIN ) 300 MG capsule Take 1 capsule (300 mg total) by mouth 3 (three) times daily.   Magnesium 200 MG TABS Take 1 tablet by mouth at bedtime.   Melatonin 10 MG TABS Take 10 mg by mouth at bedtime.   methocarbamol  (ROBAXIN ) 500 MG tablet Take 1 tablet (500 mg total) by mouth 4 (four) times daily.   mirtazapine  (REMERON  SOL-TAB) 30 MG disintegrating tablet Take 1 tablet (30 mg total) by mouth at bedtime.   Multiple Vitamin (MULTIVITAMIN  WITH MINERALS) TABS tablet Take 1 tablet by mouth daily after supper. Women's Multivitamin (Gummy)   naloxone  (NARCAN ) nasal spray 4 mg/0.1 mL Place 1 spray into the nose as needed for up to 365 doses (for opioid-induced respiratory depresssion). In case of emergency (overdose), spray once into each nostril. If no response within 3 minutes, repeat application and call 911.   omeprazole (PRILOSEC) 40 MG capsule Take 40 mg by mouth daily before breakfast.   ondansetron  (ZOFRAN -ODT) 4 MG disintegrating tablet Take 1 tablet (4 mg total) by mouth every 8 (eight) hours as needed for nausea or vomiting.   oxyCODONE -acetaminophen  (PERCOCET) 7.5-325 MG tablet Take 1 tablet by mouth 5 (five) times daily. Must last 30 days   [START ON 03/05/2024] oxyCODONE -acetaminophen  (PERCOCET) 7.5-325 MG tablet Take 1 tablet by mouth 5 (five) times daily. Must last 30 days   [START ON 04/04/2024] oxyCODONE -acetaminophen  (PERCOCET) 7.5-325 MG tablet Take 1 tablet by mouth 5 (five) times daily. Must last 30 days   spironolactone  (ALDACTONE ) 25 MG tablet TAKE ONE-HALF TABLET BY MOUTH ONCE DAILY AS DIRECTED   No facility-administered medications prior to visit.    Review of Systems  Constitutional: Negative.  Negative for fever and malaise/fatigue.  HENT: Negative.    Eyes: Negative.   Respiratory: Negative.  Negative for cough and shortness of breath.   Cardiovascular:  Positive for leg swelling (L lower extremity.). Negative for chest pain and palpitations.  Gastrointestinal: Negative.  Negative for abdominal pain, constipation, diarrhea, heartburn, nausea and vomiting.  Genitourinary: Negative.  Negative for dysuria and flank pain.  Musculoskeletal: Negative.  Negative for joint pain and myalgias.  Skin: Negative.   Neurological: Negative.  Negative for dizziness, tingling, tremors and headaches.  Endo/Heme/Allergies: Negative.   Psychiatric/Behavioral: Negative.  Negative for depression and suicidal ideas. The patient  is not nervous/anxious.        Objective:   BP (!) 112/52   Pulse 69   Ht 5' (1.524 m)   Wt 135 lb (61.2 kg)   SpO2 93%   BMI 26.37 kg/m   Vitals:   03/02/24 1107 03/02/24 1133  BP: (!) 112/52  Pulse: 65 69  Height: 5' (1.524 m)   Weight: 135 lb (61.2 kg)   SpO2: 93%   BMI (Calculated): 26.37     Physical Exam Vitals and nursing note reviewed.  Constitutional:      Appearance: Normal appearance.  HENT:     Head: Normocephalic and atraumatic.     Nose: Nose normal.     Mouth/Throat:     Mouth: Mucous membranes are moist.     Pharynx: Oropharynx is clear.  Eyes:     Conjunctiva/sclera: Conjunctivae normal.     Pupils: Pupils are equal, round, and reactive to light.  Cardiovascular:     Rate and Rhythm: Normal rate and regular rhythm.     Pulses: Normal pulses.     Heart sounds: Normal heart sounds. No murmur heard. Pulmonary:     Effort: Pulmonary effort is normal.     Breath sounds: Normal breath sounds. No wheezing.  Abdominal:     General: Bowel sounds are normal.     Palpations: Abdomen is soft.     Tenderness: There is no abdominal tenderness. There is no right CVA tenderness or left CVA tenderness.  Musculoskeletal:        General: No tenderness (left calf). Normal range of motion.     Cervical back: Normal range of motion.     Right lower leg: No edema.     Left lower leg: No edema.  Skin:    General: Skin is warm and dry.  Neurological:     General: No focal deficit present.     Mental Status: She is alert and oriented to person, place, and time.  Psychiatric:        Mood and Affect: Mood normal.        Behavior: Behavior normal.      No results found for any visits on 03/02/24.  Recent Results (from the past 2160 hours)  Tox[employer] Select 13 (MW), Urine     Status: None   Collection Time: 02/02/24  2:16 PM  Result Value Ref Range   Summary FINAL     Comment: ==================================================================== [employer]ox[employer]  Select 13 (MW) ==================================================================== Test                             Result       Flag       Units  Drug Present and Declared for Prescription Verification   Oxycodone                       2583         EXPECTED   ng/mg creat   Oxymorphone                    679          EXPECTED   ng/mg creat   Noroxycodone                   >5587        EXPECTED   ng/mg creat   Noroxymorphone                 787          EXPECTED   ng/mg creat    Sources of oxycodone  are scheduled prescription medications.    Oxymorphone, noroxycodone, and noroxymorphone are expected    metabolites of oxycodone . Oxymorphone is also available as a    scheduled prescription  medication.  Drug Absent but Declared for Prescription Verification   Clonazepam                     Not Detected UNEXPECTED ng/mg creat ==================================================================== T est                      Result    Flag   Units      Ref Range   Creatinine              179              mg/dL      >=79 ==================================================================== Declared Medications:  The flagging and interpretation on this report are based on the  following declared medications.  Unexpected results may arise from  inaccuracies in the declared medications.   **Note: The testing scope of this panel includes these medications:   Clonazepam (Rivotril)  Oxycodone  (Percocet)   **Note: The testing scope of this panel does not include the  following reported medications:   Acetaminophen  (Percocet)  Atorvastatin  (Lipitor)  Calcium   Docusate (Colace)  Enalapril  (Vasotec )  Estradiol  (Estrace )  Furosemide  (Lasix )  Gabapentin  (Neurontin )  Magnesium  Melatonin  Methocarbamol  (Robaxin )  Mirtazapine  (Remeron )  Naloxone  (Narcan )  Omeprazole (Prilosec)  Ondansetron  (Zofran )  Spironolactone  (Aldactone )  Supplement  Vitamin D  ===============  ===================================================== For clinical consultation, please call [phone]. ====================================================================       Assessment & Plan:  Left lower extremity venous doppler - Negative. Continue current meds. Increase physical activity as tolerated. 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   Total time spent: 30 minutes  FERNAND FREDY RAMAN, MD  03/02/2024   This document may have been prepared by [facility] Voice Recognition software and as such may include unintentional dictation errors.

## 2024-03-03 LAB — CMP14+EGFR
ALT: 7 IU/L (ref 0–32)
AST: 18 IU/L (ref 0–40)
Albumin: 3.7 g/dL — ABNORMAL LOW (ref 3.8–4.8)
Alkaline Phosphatase: 75 IU/L (ref 44–121)
BUN/Creatinine Ratio: 9 — ABNORMAL LOW (ref 12–28)
BUN: 9 mg/dL (ref 8–27)
Bilirubin Total: 0.3 mg/dL (ref 0.0–1.2)
CO2: 23 mmol/L (ref 20–29)
Calcium: 8.9 mg/dL (ref 8.7–10.3)
Chloride: 104 mmol/L (ref 96–106)
Creatinine, Ser: 1.01 mg/dL — ABNORMAL HIGH (ref 0.57–1.00)
Globulin, Total: 1.4 g/dL — ABNORMAL LOW (ref 1.5–4.5)
Glucose: 71 mg/dL (ref 70–99)
Potassium: 4.4 mmol/L (ref 3.5–5.2)
Sodium: 140 mmol/L (ref 134–144)
Total Protein: 5.1 g/dL — ABNORMAL LOW (ref 6.0–8.5)
eGFR: 59 mL/min/1.73 — ABNORMAL LOW (ref >59)

## 2024-03-03 LAB — CBC WITH DIFFERENTIAL/PLATELET
Basophils Absolute: 0 x10E3/uL (ref 0.0–0.2)
Basos: 0 %
EOS (ABSOLUTE): 0.2 x10E3/uL (ref 0.0–0.4)
Eos: 3 %
Hematocrit: 35.2 % (ref 34.0–46.6)
Hemoglobin: 11.3 g/dL (ref 11.1–15.9)
Immature Grans (Abs): 0 x10E3/uL (ref 0.0–0.1)
Immature Granulocytes: 0 %
Lymphocytes Absolute: 1.4 x10E3/uL (ref 0.7–3.1)
Lymphs: 30 %
MCH: 32.8 pg (ref 26.6–33.0)
MCHC: 32.1 g/dL (ref 31.5–35.7)
MCV: 102 fL — ABNORMAL HIGH (ref 79–97)
Monocytes Absolute: 0.6 x10E3/uL (ref 0.1–0.9)
Monocytes: 12 %
Neutrophils Absolute: 2.6 x10E3/uL (ref 1.4–7.0)
Neutrophils: 55 %
Platelets: 176 x10E3/uL (ref 150–450)
RBC: 3.44 x10E6/uL — ABNORMAL LOW (ref 3.77–5.28)
RDW: 12.7 % (ref 11.7–15.4)
WBC: 4.8 x10E3/uL (ref 3.4–10.8)

## 2024-03-11 NOTE — Progress Notes (Signed)
 Patient notified

## 2024-04-05 ENCOUNTER — Other Ambulatory Visit: Payer: Self-pay | Admitting: *Deleted

## 2024-04-05 ENCOUNTER — Telehealth: Payer: Self-pay

## 2024-04-05 DIAGNOSIS — Z79899 Other long term (current) drug therapy: Secondary | ICD-10-CM

## 2024-04-05 DIAGNOSIS — M7061 Trochanteric bursitis, right hip: Secondary | ICD-10-CM

## 2024-04-05 DIAGNOSIS — M47816 Spondylosis without myelopathy or radiculopathy, lumbar region: Secondary | ICD-10-CM

## 2024-04-05 DIAGNOSIS — G8929 Other chronic pain: Secondary | ICD-10-CM

## 2024-04-05 DIAGNOSIS — M961 Postlaminectomy syndrome, not elsewhere classified: Secondary | ICD-10-CM

## 2024-04-05 DIAGNOSIS — Z79891 Long term (current) use of opiate analgesic: Secondary | ICD-10-CM

## 2024-04-05 DIAGNOSIS — M25551 Pain in right hip: Secondary | ICD-10-CM

## 2024-04-05 DIAGNOSIS — G894 Chronic pain syndrome: Secondary | ICD-10-CM

## 2024-04-05 MED ORDER — OXYCODONE-ACETAMINOPHEN 7.5-325 MG PO TABS: 1.0000 | ORAL_TABLET | Freq: Every day | ORAL | 0 refills | Status: DC

## 2024-04-05 NOTE — Telephone Encounter (Signed)
 They have questions about her oxycodone  script. It was dated to be filled yesterday and they dont have her meds in stock until Tuesday. It is in stock on [address] if you can switch it to there

## 2024-04-05 NOTE — Telephone Encounter (Signed)
 Patient informed.

## 2024-04-05 NOTE — Telephone Encounter (Signed)
 Rx request sent to [facility].

## 2024-04-27 ENCOUNTER — Ambulatory Visit: Payer: PRIVATE HEALTH INSURANCE | Admitting: Cardiovascular Disease

## 2024-05-03 ENCOUNTER — Encounter: Payer: Self-pay | Admitting: Nurse Practitioner

## 2024-05-03 ENCOUNTER — Ambulatory Visit: Attending: Nurse Practitioner | Admitting: Nurse Practitioner

## 2024-05-03 DIAGNOSIS — M25552 Pain in left hip: Secondary | ICD-10-CM | POA: Diagnosis present

## 2024-05-03 DIAGNOSIS — M7061 Trochanteric bursitis, right hip: Secondary | ICD-10-CM | POA: Diagnosis present

## 2024-05-03 DIAGNOSIS — M961 Postlaminectomy syndrome, not elsewhere classified: Secondary | ICD-10-CM | POA: Insufficient documentation

## 2024-05-03 DIAGNOSIS — M7062 Trochanteric bursitis, left hip: Secondary | ICD-10-CM | POA: Insufficient documentation

## 2024-05-03 DIAGNOSIS — G8929 Other chronic pain: Secondary | ICD-10-CM | POA: Diagnosis present

## 2024-05-03 DIAGNOSIS — M545 Low back pain, unspecified: Secondary | ICD-10-CM | POA: Diagnosis present

## 2024-05-03 DIAGNOSIS — G894 Chronic pain syndrome: Secondary | ICD-10-CM | POA: Diagnosis present

## 2024-05-03 DIAGNOSIS — Z79899 Other long term (current) drug therapy: Secondary | ICD-10-CM | POA: Diagnosis present

## 2024-05-03 DIAGNOSIS — M79605 Pain in left leg: Secondary | ICD-10-CM | POA: Diagnosis present

## 2024-05-03 DIAGNOSIS — M47816 Spondylosis without myelopathy or radiculopathy, lumbar region: Secondary | ICD-10-CM | POA: Diagnosis present

## 2024-05-03 DIAGNOSIS — M79604 Pain in right leg: Secondary | ICD-10-CM | POA: Diagnosis present

## 2024-05-03 DIAGNOSIS — M533 Sacrococcygeal disorders, not elsewhere classified: Secondary | ICD-10-CM | POA: Insufficient documentation

## 2024-05-03 DIAGNOSIS — M542 Cervicalgia: Secondary | ICD-10-CM | POA: Insufficient documentation

## 2024-05-03 DIAGNOSIS — Z79891 Long term (current) use of opiate analgesic: Secondary | ICD-10-CM | POA: Insufficient documentation

## 2024-05-03 DIAGNOSIS — M25551 Pain in right hip: Secondary | ICD-10-CM | POA: Insufficient documentation

## 2024-05-03 MED ORDER — OXYCODONE-ACETAMINOPHEN 7.5-325 MG PO TABS: 1.0000 | ORAL_TABLET | Freq: Every day | ORAL | 0 refills | Status: DC

## 2024-05-03 MED ORDER — NALOXONE HCL 4 MG/0.1ML NA LIQD: 1.0000 | NASAL | 0 refills | Status: AC | PRN

## 2024-05-03 NOTE — Progress Notes (Signed)
 Nursing Pain Medication Assessment:  Safety precautions to be maintained throughout the outpatient stay will include: orient to surroundings, keep bed in low position, maintain call bell within reach at all times, provide assistance with transfer out of bed and ambulation.  Medication Inspection Compliance: Pill count conducted under aseptic conditions, in front of the patient. Neither the pills nor the bottle was removed from the patient's sight at any time. Once count was completed pills were immediately returned to the patient in their original bottle.  Medication: Oxycodone /APAP Pill/Patch Count: 9 of 150 pills/patches remain Pill/Patch Appearance: Markings consistent with prescribed medication Bottle Appearance: Standard pharmacy container. Clearly labeled. Filled Date: 10 / 06 / 2025 Last Medication intake:  Today

## 2024-05-03 NOTE — Progress Notes (Signed)
 PROVIDER NOTE: Interpretation of information contained herein should be left to medically-trained personnel. Specific patient instructions are provided elsewhere under Patient Instructions section of medical record. This document was created in part using AI and STT-dictation technology, any transcriptional errors that may result from this process are unintentional.  Patient: Jacqueline Thornton  Service: E/M   PCP: Fernand Fredy RAMAN, MD  DOB: [birth year]  DOS: 05/03/2024  Provider: Emmy MARLA Blanch, NP  MRN: [id number]  Delivery: Face-to-face  Specialty: Interventional Pain Management  Type: Established Patient  Setting: Ambulatory outpatient facility  Specialty designation: 09  Referring Prov.: Fernand Fredy RAMAN, MD  Location: Outpatient office facility       History of present illness (HPI) [name], a 73 y.o. year old female, is here today because of her low back pain. Jacqueline Thornton's primary complain today is Back Pain (lower)  Pertinent problems: Jacqueline Thornton has Chronic low back pain (Bilateral) (R>L) w/o sciatica; Spondylosis of lumbar spine; Headache, migraine; Lumbar annular disc tear (L4-L5); Discogenic low back pain (L3-L4 and L4-L5); Lumbar facet hypertrophy; Lumbar facet syndrome (Bilateral) (R>L); Chronic neck pain (Left); Cervical spondylosis; History of cervical spine surgery; Trochanteric bursitis of hip (Right); Neurogenic pain; Myofascial pain syndrome; and Chronic pain syndrome on their pertinent problem list. Pain Assessment: Severity of Chronic pain is reported as a 4 /10. Location: Back Lower/down legs to calves bilat, RIGHT side worse. Onset: More than a month ago. Quality: Stabbing. Timing: Constant. Modifying factor(s): meds, sometimes must take ibuprofen  in addition to oxycodone /APAP. Vitals:  height is 5' (1.524 m) and weight is 123 lb (55.8 kg). Her temperature is 97.3 F (36.3 C) (abnormal). Her blood pressure is 127/68 and her pulse is 74. Her respiration is 16 and oxygen  saturation is 100%.  BMI: Estimated body mass index is 24.02 kg/m as calculated from the following:   Height as of this encounter: 5' (1.524 m).   Weight as of this encounter: 123 lb (55.8 kg).  Last encounter: 02/02/2024. Last procedure: Visit date not found.  Reason for encounter: medication management. The patient indicates doing well with current medication regimen. No adverse reaction or side effects reported to medication.   Discussed the use of AI scribe software for clinical note transcription with the patient, who gave verbal consent to proceed.  History of Present Illness   Jacqueline Thornton is a 73 year old female with a history of lumbar fusion and sacroiliac joint surgery who presents with lower back pain and sciatica.  She has been experiencing lower back pain and right-sided sciatica for the past three weeks, which began after vacuuming. The pain has increased from a 2 to a 4 on the pain scale, requiring her to take three ibuprofen  to manage the discomfort enough to drive to the appointment.  She has a history of lumbar MBB (pedicle screw hardware injection), which previously provided relief. She also has a history of sacroiliac joint surgery on the left side, which significantly alleviated her joint pain, although she continues to experience leg pain. She reports that her worst pain is in the area of the sacroiliac joint, but she is not sure.  She is currently taking oxycodone  five times a day for pain management and reports no side effects from Percocet. Pharmacotherapy Assessment   Oxycodone -acetaminophen  (Percocet) 7.5-325 mg tablet 5 times daily as needed for pain. MME=56.25 Monitoring: [place] PMP: PDMP reviewed during this encounter.       Pharmacotherapy: No side-effects or adverse reactions reported. Compliance:  No problems identified. Effectiveness: Clinically acceptable.  Dayna Pulling, RN  05/03/2024  1:00 PM  Sign when Signing Visit Nursing Pain Medication  Assessment:  Safety precautions to be maintained throughout the outpatient stay will include: orient to surroundings, keep bed in low position, maintain call bell within reach at all times, provide assistance with transfer out of bed and ambulation.  Medication Inspection Compliance: Pill count conducted under aseptic conditions, in front of the patient. Neither the pills nor the bottle was removed from the patient's sight at any time. Once count was completed pills were immediately returned to the patient in their original bottle.  Medication: Oxycodone /APAP Pill/Patch Count: 9 of 150 pills/patches remain Pill/Patch Appearance: Markings consistent with prescribed medication Bottle Appearance: Standard pharmacy container. Clearly labeled. Filled Date: 10 / 06 / 2025 Last Medication intake:  Today    UDS:  Summary  Date Value Ref Range Status  02/02/2024 FINAL  Final    Comment:    ==================================================================== [employer] [facility] [employer] (MW) ==================================================================== Test                             Result       Flag       Units  Drug Present and Declared for Prescription Verification   Oxycodone                       2583         EXPECTED   ng/mg creat   Oxymorphone                    679          EXPECTED   ng/mg creat   Noroxycodone                   >5587        EXPECTED   ng/mg creat   Noroxymorphone                 787          EXPECTED   ng/mg creat    Sources of oxycodone  are scheduled prescription medications.    Oxymorphone, noroxycodone, and noroxymorphone are expected    metabolites of oxycodone . Oxymorphone is also available as a    scheduled prescription medication.  Drug Absent but Declared for Prescription Verification   Clonazepam                     Not Detected UNEXPECTED ng/mg creat ==================================================================== Test                      Result     Flag   Units      Ref Range   Creatinine              179              mg/dL      >=79 ==================================================================== Declared Medications:  The flagging and interpretation on this report are based on the  following declared medications.  Unexpected results may arise from  inaccuracies in the declared medications.   **Note: The testing scope of this panel includes these medications:   Clonazepam (Rivotril)  Oxycodone  (Percocet)   **Note: The testing scope of this panel does not include the  following reported medications:   Acetaminophen  (Percocet)  Atorvastatin  (Lipitor)  Calcium   Docusate (Colace)  Enalapril  (Vasotec )  Estradiol  (Estrace )  Furosemide  (Lasix )  Gabapentin  (Neurontin )  Magnesium  Melatonin  Methocarbamol  (Robaxin )  Mirtazapine  (Remeron )  Naloxone  (Narcan )  Omeprazole (Prilosec)  Ondansetron  (Zofran )  Spironolactone  (Aldactone )  Supplement  Vitamin D  ==================================================================== For clinical consultation, please call [phone]. ====================================================================     No results found for: CBDTHCR No results found for: D8THCCBX No results found for: D9THCCBX  ROS  Constitutional: Denies any fever or chills Gastrointestinal: No reported hemesis, hematochezia, vomiting, or acute GI distress Musculoskeletal: low back pain  Neurological: No reported episodes of acute onset apraxia, aphasia, dysarthria, agnosia, amnesia, paralysis, loss of coordination, or loss of consciousness  Medication Review  Calcium -Vitamin D -Vitamin K, Magnesium, Melatonin, atorvastatin , docusate sodium , enalapril , estradiol , furosemide , gabapentin , methocarbamol , mirtazapine , multivitamin with minerals, naloxone , omeprazole, ondansetron , oxyCODONE -acetaminophen , and spironolactone   History Review  Allergy: Ms. Reveron has no known allergies. Drug: Ms. Talamante   reports no history of drug use. Alcohol:  reports no history of alcohol use. Tobacco:  reports that she quit smoking about 20 years ago. Her smoking use included cigarettes. She has never used smokeless tobacco. Social: Ms. Avis  reports that she quit smoking about 20 years ago. Her smoking use included cigarettes. She has never used smokeless tobacco. She reports that she does not drink alcohol and does not use drugs. 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 Surgical: Ms. Klausner  has a past surgical history that includes Cholecystectomy; Abdominal hysterectomy; Neck surgery; Colonoscopy with propofol  (N/A, 07/19/2015); caract surger; Laparoscopic salpingo oophorectomy (Bilateral, 03/03/2018); Cystoscopy (03/03/2018); Tonsillectomy; Appendectomy; Tumor removal; Anterior lumbar fusion (N/A, 12/11/2018); Abdominal exposure (N/A, 12/11/2018); Anterior cervical decomp/discectomy fusion (N/A, 10/06/2020); Toe Fusion (Right); Esophagogastroduodenoscopy (egd) with propofol  (N/A, 09/11/2021); Colonoscopy with propofol  (N/A, 09/11/2021); Colonoscopy with propofol  (N/A, 03/05/2022); and Transforaminal lumbar interbody fusion w/ mis 1 level (Right, 04/30/2022). Family: family history includes Colon cancer in her maternal grandmother and mother; Diabetes in her father and mother;  Hypertension in her sister; Stroke in her mother.  Laboratory Chemistry Profile   Renal Lab Results  Component Value Date   BUN 9 03/02/2024   CREATININE 1.01 (H) 03/02/2024   BCR 9 (L) 03/02/2024   GFRAA 47 (L) 12/16/2019   GFRNONAA >60 04/24/2022    Hepatic Lab Results  Component Value Date   AST 18 03/02/2024   ALT 7 03/02/2024   ALBUMIN 3.7 (L) 03/02/2024   ALKPHOS 75 03/02/2024    Electrolytes Lab Results  Component Value Date   NA 140 03/02/2024   K 4.4 03/02/2024   CL 104 03/02/2024   CALCIUM  8.9 03/02/2024   MG 2.4 (H) 12/16/2019    Bone Lab Results  Component Value Date   VD25OH 33.3 09/16/2023   25OHVITD1 53 12/16/2019   25OHVITD2 <1.0 12/16/2019   25OHVITD3 53 12/16/2019    Inflammation (CRP: Acute Phase) (ESR: Chronic Phase) Lab Results  Component Value Date   CRP 4 12/16/2019   ESRSEDRATE 6 12/16/2019         Note: Above Lab results reviewed.  Recent Imaging Review  US  Venous Img Lower Unilateral Left CLINICAL DATA:  History of prior DVT. Recent postop. Pain and swelling behind LEFT knee.  EXAM: LEFT LOWER EXTREMITY VENOUS DOPPLER ULTRASOUND  TECHNIQUE: Gray-scale sonography with compression, as well as color and duplex ultrasound, were performed to evaluate the deep venous system(s) from the level of the common femoral vein through the popliteal and proximal calf veins.  COMPARISON:  05/17/2021  FINDINGS: VENOUS  Normal compressibility of the common femoral, superficial femoral, and popliteal veins, as well as the visualized calf veins. Visualized portions of profunda femoral vein and great saphenous vein unremarkable. No filling defects to suggest DVT on grayscale or color Doppler imaging. Doppler waveforms show normal direction of venous flow, normal respiratory plasticity and response to augmentation.  Limited views of the contralateral common femoral vein are unremarkable.  OTHER  None.  Limitations:  none  IMPRESSION: No DVT of the left lower extremity.  Electronically Signed   By: Aliene Lloyd M.D.   On: 03/02/2024 14:00 Note: Reviewed        Physical Exam  Vitals: BP 127/68   Pulse 74   Temp (!) 97.3 F (36.3 C)   Resp 16   Ht 5' (1.524 m)   Wt 123 lb (55.8 kg)   SpO2 100%   BMI 24.02 kg/m  BMI: Estimated body mass index is 24.02 kg/m as calculated from the following:   Height as of this encounter: 5' (1.524 m).   Weight as of this encounter: 123 lb (55.8 kg). Ideal: Ideal body weight: 45.5 kg (100 lb 4.9 oz) Adjusted ideal body weight: 49.6 kg (109 lb 6.2 oz) General appearance: Well nourished, well developed, and well hydrated. In no apparent acute distress Mental status: Alert, oriented x 3 (person, place, & time)       Respiratory: No evidence of acute respiratory distress Eyes: PERLA  Musculoskeletal: +LBP Assessment   Diagnosis Status  1. Chronic low back pain (Bilateral) (R>L) w/o sciatica   2. Chronic pain syndrome   3. Chronic use of opiate for therapeutic purpose   4. Chronic lower extremity pain (Bilateral) (R>L)   5. Chronic hip pain (Bilateral) (L>R)   6. Chronic sacroiliac joint pain (Bilateral) (L>R)   7. Chronic neck pain (Left)   8. Lumbar facet syndrome (Bilateral) (R>L)   9. 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 Encounter for chronic pain management    Controlled Controlled Controlled   Updated Problems: No problems updated.  Plan of Care  Problem-specific:  Assessment and Plan    Chronic low back pain with right-sided sciatica and postlaminectomy syndrome Pain increased from 2 to 4, managed with ibuprofen  and oxycodone . - Continue oxycodone  as needed. Patient's pain well managed with Percocet, will continue on current medication regimen.  The patient continues experiencing low back pain, however, she manage with oxycodone  and does not want any interventional treatment at this  time.  She had surgery in [place] on her back twice in the last few years. She had L4-L5 posterior fusion and she has had a L5-S1 fusion.  She has previously undergone surgical intervention for fusion n her right foot, performed by Dr. Ashley.  Chronic pain syndrome Managed with oxycodone , no side effects reported. Oxycodone  taken five times daily. - Continue current oxycodone  regimen. Patient's pain well managed with Percocet, will continue on current medication regimen.  Prescribing drug monitoring (PMP) reviewed; findings consistent with the use of prescribed medication and no evidence of narcotic misuse or abuse.  Urine drug screening (  UDS) up to date and consistent with the use of prescribed medication. Schedule follow-up in 90 days for medication management.   Chronic use of opioid for therapeutic purpose: Medications   oxyCODONE -acetaminophen  (PERCOCET) 7.5-325 MG tablet    Sig: Take 1 tablet by mouth 5 (five) times daily. Must last 30 days    Dispense:  150 tablet    Refill:  0    DO NOT: delete (not duplicate); no partial-fill (will deny script to complete), no refill request (F/U required). DISPENSE: 1 day early if closed on fill date. WARN: No CNS-depressants within 8 hrs of med.   oxyCODONE -acetaminophen  (PERCOCET) 7.5-325 MG tablet    Sig: Take 1 tablet by mouth 5 (five) times daily. Must last 30 days    Dispense:  150 tablet    Refill:  0    DO NOT: delete (not duplicate); no partial-fill (will deny script to complete), no refill request (F/U required). DISPENSE: 1 day early if closed on fill date. WARN: No CNS-depressants within 8 hrs of med.   oxyCODONE -acetaminophen  (PERCOCET) 7.5-325 MG tablet    Sig: Take 1 tablet by mouth 5 (five) times daily. Must last 30 days    Dispense:  150 tablet    Refill:  0    DO NOT: delete (not duplicate); no partial-fill (will deny script to complete), no refill request (F/U required). DISPENSE: 1 day early if closed on fill date. WARN: No  CNS-depressants within 8 hrs of med.        [name] has a current medication list which includes the following long-term medication(s): atorvastatin , viactiv calcium  plus d, enalapril , estradiol , furosemide , gabapentin , mirtazapine , naloxone , omeprazole, spironolactone , [START ON 05/05/2024] oxycodone -acetaminophen , [START ON 06/04/2024] oxycodone -acetaminophen , and [START ON 07/04/2024] oxycodone -acetaminophen .  Pharmacotherapy (Medications Ordered): Meds ordered this encounter  Medications   oxyCODONE -acetaminophen  (PERCOCET) 7.5-325 MG tablet    Sig: Take 1 tablet by mouth 5 (five) times daily. Must last 30 days    Dispense:  150 tablet    Refill:  0    DO NOT: delete (not duplicate); no partial-fill (will deny script to complete), no refill request (F/U required). DISPENSE: 1 day early if closed on fill date. WARN: No CNS-depressants within 8 hrs of med.   oxyCODONE -acetaminophen  (PERCOCET) 7.5-325 MG tablet    Sig: Take 1 tablet by mouth 5 (five) times daily. Must last 30 days    Dispense:  150 tablet    Refill:  0    DO NOT: delete (not duplicate); no partial-fill (will deny script to complete), no refill request (F/U required). DISPENSE: 1 day early if closed on fill date. WARN: No CNS-depressants within 8 hrs of med.   oxyCODONE -acetaminophen  (PERCOCET) 7.5-325 MG tablet    Sig: Take 1 tablet by mouth 5 (five) times daily. Must last 30 days    Dispense:  150 tablet    Refill:  0    DO NOT: delete (not duplicate); no partial-fill (will deny script to complete), no refill request (F/U required). DISPENSE: 1 day early if closed on fill date. WARN: No CNS-depressants within 8 hrs of med.   Orders:  No orders of the defined types were placed in this encounter.       Return in about 3 months (around 08/03/2024) for (F2F), (MM), Emmy Blanch NP.    Recent Visits No visits were found meeting these conditions. Showing recent visits within past 90 days and meeting all other  requirements Today's Visits Date Type Provider Dept  05/03/24  Office Visit Icarus Partch K, NP [facility]  Showing today's visits and meeting all other requirements Future Appointments No visits were found meeting these conditions. Showing future appointments within next 90 days and meeting all other requirements  I discussed the assessment and treatment plan with the patient. The patient was provided an opportunity to ask questions and all were answered. The patient agreed with the plan and demonstrated an understanding of the instructions.  Patient advised to call back or seek an in-person evaluation if the symptoms or condition worsens.  I personally spent a total of 30 minutes in the care of the patient today including preparing to see the patient, getting/reviewing separately obtained history, performing a medically appropriate exam/evaluation, counseling and educating, placing orders, referring and communicating with other health care professionals, documenting clinical information in the EHR, independently interpreting results, communicating results, and coordinating care.   Note by: Zedric Deroy K Leron Stoffers, NP (TTS and AI technology used. I apologize for any typographical errors that were not detected and corrected.) Date: 05/03/2024; Time: 1:32 PM

## 2024-05-19 ENCOUNTER — Other Ambulatory Visit: Payer: Self-pay | Admitting: Internal Medicine

## 2024-05-19 DIAGNOSIS — I10 Essential (primary) hypertension: Secondary | ICD-10-CM

## 2024-05-25 ENCOUNTER — Other Ambulatory Visit: Payer: Self-pay | Admitting: Cardiology

## 2024-05-25 ENCOUNTER — Other Ambulatory Visit: Payer: Self-pay | Admitting: Internal Medicine

## 2024-05-25 DIAGNOSIS — E782 Mixed hyperlipidemia: Secondary | ICD-10-CM

## 2024-05-31 ENCOUNTER — Encounter: Payer: Self-pay | Admitting: Cardiovascular Disease

## 2024-05-31 ENCOUNTER — Ambulatory Visit: Payer: PRIVATE HEALTH INSURANCE | Admitting: Cardiovascular Disease

## 2024-05-31 VITALS — BP 142/70 | HR 69 | Ht 60.0 in | Wt 136.0 lb

## 2024-05-31 DIAGNOSIS — I10 Essential (primary) hypertension: Secondary | ICD-10-CM

## 2024-05-31 DIAGNOSIS — Z01810 Encounter for preprocedural cardiovascular examination: Secondary | ICD-10-CM | POA: Diagnosis not present

## 2024-05-31 DIAGNOSIS — R0989 Other specified symptoms and signs involving the circulatory and respiratory systems: Secondary | ICD-10-CM

## 2024-05-31 DIAGNOSIS — M461 Sacroiliitis, not elsewhere classified: Secondary | ICD-10-CM | POA: Diagnosis not present

## 2024-05-31 DIAGNOSIS — E782 Mixed hyperlipidemia: Secondary | ICD-10-CM

## 2024-05-31 DIAGNOSIS — Z131 Encounter for screening for diabetes mellitus: Secondary | ICD-10-CM

## 2024-05-31 DIAGNOSIS — I2510 Atherosclerotic heart disease of native coronary artery without angina pectoris: Secondary | ICD-10-CM

## 2024-05-31 NOTE — Progress Notes (Signed)
 Cardiology Office Note   Date:  05/31/2024   ID:  Jacqueline Thornton, DOB 1950-08-04, MRN [id number]  PCP:  Fernand Fredy RAMAN, MD  Cardiologist:  Denyse Fernand, MD      History of Present Illness: Jacqueline Thornton is a 73 y.o. female who presents for  Chief Complaint  Patient presents with   Follow-up    Cardiac follow up    Needs  right sacroilliac surgery.      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   Atypical face pain 04/24/2015   Back pain    CAD (coronary artery disease)    pt denies   Cancer (HCC)    squamous cell- R temple    Chronic back pain    Constipation    Depression    Fibromyalgia    GERD (gastroesophageal reflux disease)    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) 04/11/2015   H/O: hysterectomy 1979   Heart murmur    Hyperlipidemia    Hypertension    Low back pain 04/06/2015   Lumbar radicular pain 04/18/2015   Migraine    Narrowing of intervertebral disc space 04/11/2015   Sacroiliac joint pain 04/11/2015   Spine disorder      Past Surgical History:  Procedure Laterality Date   ABDOMINAL EXPOSURE N/A 12/11/2018   Procedure: ABDOMINAL EXPOSURE;  Surgeon: Serene Gaile ORN, MD;  Location: [facility] OR;  Service: Vascular;  Laterality: N/A;   ABDOMINAL HYSTERECTOMY     ANTERIOR CERVICAL DECOMP/DISCECTOMY FUSION N/A 10/06/2020   Procedure: Cervical three -four Anterior cervical decompression/discectomy/fusion  with possible anchor c and exploration of fusion Cervical four to Six;  Surgeon: Unice Pac, MD;  Location: [facility] OR;  Service: Neurosurgery;  Laterality: N/A;   ANTERIOR LUMBAR FUSION N/A 12/11/2018   Procedure: Lumbar five Sacral one Anterior lumbar interbody fusion;  Surgeon: Unice Pac, MD;  Location: [facility] OR;  Service: Neurosurgery;  Laterality: N/A;   APPENDECTOMY      Age19   caract surger     02/24/17   CHOLECYSTECTOMY     COLONOSCOPY WITH PROPOFOL  N/A 07/19/2015   Procedure: COLONOSCOPY WITH PROPOFOL ;  Surgeon: Lamar ONEIDA Holmes, MD;  Location: [facility] ENDOSCOPY;  Service: Endoscopy;  Laterality: N/A;   COLONOSCOPY WITH PROPOFOL  N/A 09/11/2021   Procedure: COLONOSCOPY WITH PROPOFOL ;  Surgeon: Maryruth Ole ONEIDA, MD;  Location: [facility] ENDOSCOPY;  Service: Endoscopy;  Laterality: N/A;   COLONOSCOPY WITH PROPOFOL  N/A 03/05/2022   Procedure: COLONOSCOPY WITH PROPOFOL ;  Surgeon: Maryruth Ole ONEIDA, MD;  Location: [facility] ENDOSCOPY;  Service: Endoscopy;  Laterality: N/A;   CYSTOSCOPY  03/03/2018   Procedure: CYSTOSCOPY;  Surgeon: Leonce Garnette BIRCH, MD;  Location: [facility] ORS;  Service: Gynecology;;   ESOPHAGOGASTRODUODENOSCOPY (EGD) WITH PROPOFOL  N/A 09/11/2021   Procedure: ESOPHAGOGASTRODUODENOSCOPY (EGD) WITH PROPOFOL ;  Surgeon: Maryruth Ole ONEIDA, MD;  Location: [facility] ENDOSCOPY;  Service: Endoscopy;  Laterality: N/A;   LAPAROSCOPIC SALPINGO OOPHERECTOMY Bilateral 03/03/2018   Procedure: LAPAROSCOPIC SALPINGO OOPHORECTOMY;  Surgeon: Leonce Garnette BIRCH, MD;  Location: [facility] ORS;  Service: Gynecology;  Laterality: Bilateral;   NECK SURGERY     Age 59 and 18.   TOE FUSION Right    TONSILLECTOMY     Age 45   TRANSFORAMINAL LUMBAR INTERBODY FUSION W/ MIS 1 LEVEL Right 04/30/2022   Procedure:  Minimally Invasive  Decompression and  Transforaminal Lumbar Interbody Fusion Lumbar Four-Five;  Surgeon: Dawley, Lani BROCKS, DO;  Location: [facility] OR;  Service: Neurosurgery;  Laterality: Right;  3C   TUMOR REMOVAL     Ovaries     Current Outpatient Medications  Medication Sig Dispense Refill   atorvastatin  (LIPITOR) 20 MG tablet Take 1 tablet by mouth once daily 90 tablet 3   docusate sodium  (COLACE) 100 MG capsule Take 100 mg by mouth 2 (two) times daily.     enalapril  (VASOTEC ) 5 MG tablet Take 1 tablet by mouth once daily 90 tablet 3   estradiol  (ESTRACE ) 0.5 MG tablet Take 1  tablet by mouth once daily 90 tablet 0   furosemide  (LASIX ) 20 MG tablet Take 20 mg by mouth daily with lunch.     gabapentin  (NEURONTIN ) 300 MG capsule Take 1 capsule (300 mg total) by mouth 3 (three) times daily. 180 capsule 3   Magnesium 200 MG TABS Take 1 tablet by mouth at bedtime.     Melatonin 10 MG TABS Take 10 mg by mouth at bedtime.     methocarbamol  (ROBAXIN ) 750 MG tablet Take 750 mg by mouth every 8 (eight) hours as needed for muscle spasms.     mirtazapine  (REMERON  SOL-TAB) 30 MG disintegrating tablet Take 1 tablet (30 mg total) by mouth at bedtime. 30 tablet 6   Multiple Vitamin (MULTIVITAMIN WITH MINERALS) TABS tablet Take 1 tablet by mouth daily after supper. Women's Multivitamin (Gummy)     naloxone  (NARCAN ) nasal spray 4 mg/0.1 mL Place 1 spray into the nose as needed for up to 365 doses (for opioid-induced respiratory depresssion). In case of emergency (overdose), spray once into each nostril. If no response within 3 minutes, repeat application and call 911. 1 each 0   omeprazole (PRILOSEC) 40 MG capsule Take 40 mg by mouth daily before breakfast.     ondansetron  (ZOFRAN -ODT) 4 MG disintegrating tablet Take 1 tablet (4 mg total) by mouth every 8 (eight) hours as needed for nausea or vomiting. 20 tablet 0   oxyCODONE -acetaminophen  (PERCOCET) 7.5-325 MG tablet Take 1 tablet by mouth 5 (five) times daily. Must last 30 days 150 tablet 0   [START ON 06/04/2024] oxyCODONE -acetaminophen  (PERCOCET) 7.5-325 MG tablet Take 1 tablet by mouth 5 (five) times daily. Must last 30 days 150 tablet 0   [START ON 07/04/2024] oxyCODONE -acetaminophen  (PERCOCET) 7.5-325 MG tablet Take 1 tablet by mouth 5 (five) times daily. Must last 30 days 150 tablet 0   spironolactone  (ALDACTONE ) 25 MG tablet TAKE ONE-HALF TABLET BY MOUTH ONCE DAILY AS DIRECTED 45 tablet 0   Calcium -Vitamin D -Vitamin K (VIACTIV CALCIUM  PLUS D) 650-12.5-40 MG-MCG-MCG CHEW Chew 1 tablet by mouth in the morning and at bedtime. Morning &  supper (Patient not taking: Reported on 05/31/2024)     meloxicam  (MOBIC ) 15 MG tablet Take 15 mg by mouth daily. (Patient not taking: Reported on 05/31/2024)     methocarbamol  (ROBAXIN ) 500 MG tablet Take 1 tablet (500 mg total) by mouth 4 (four) times daily. (Patient not taking: Reported on 05/31/2024) 90 tablet 0   No current facility-administered medications for this visit.    Allergies:   Patient has no known allergies.    Social History:   reports that she quit smoking about 20 years ago. Her smoking use included cigarettes. She has never used smokeless tobacco. She reports that she does not drink alcohol and does not use drugs.   Family History:  family history includes  Colon cancer in her maternal grandmother and mother; Diabetes in her father and mother; Hypertension in her sister; Stroke in her mother.    ROS:     Review of Systems  Constitutional: Negative.   HENT: Negative.    Eyes: Negative.   Respiratory: Negative.    Gastrointestinal: Negative.   Genitourinary: Negative.   Musculoskeletal: Negative.   Skin: Negative.   Neurological: Negative.   Endo/Heme/Allergies: Negative.   Psychiatric/Behavioral: Negative.    All other systems reviewed and are negative.     All other systems are reviewed and negative.    PHYSICAL EXAM: VS:  BP (!) 142/70   Pulse 69   Ht 5' (1.524 m)   Wt 136 lb (61.7 kg)   SpO2 99%   BMI 26.56 kg/m  , BMI Body mass index is 26.56 kg/m. Last weight:  Wt Readings from Last 3 Encounters:  05/31/24 136 lb (61.7 kg)  05/03/24 123 lb (55.8 kg)  03/02/24 135 lb (61.2 kg)     Physical Exam Constitutional:      Appearance: Normal appearance.  Cardiovascular:     Rate and Rhythm: Normal rate and regular rhythm.     Heart sounds: Normal heart sounds.  Pulmonary:     Effort: Pulmonary effort is normal.     Breath sounds: Normal breath sounds.  Musculoskeletal:     Right lower leg: No edema.     Left lower leg: No edema.   Neurological:     Mental Status: She is alert.       EKG:   Recent Labs: 09/16/2023: TSH 2.000 03/02/2024: ALT 7; BUN 9; Creatinine, Ser 1.01; Hemoglobin 11.3; Platelets 176; Potassium 4.4; Sodium 140    Lipid Panel    Component Value Date/Time   CHOL 132 09/16/2023 0941   TRIG 89 09/16/2023 0941   HDL 50 09/16/2023 0941   CHOLHDL 2.6 09/16/2023 0941   LDLCALC 65 09/16/2023 0941      Other studies Reviewed: Additional studies/ records that were reviewed today include:  Review of the above records demonstrates:       No data to display            ASSESSMENT AND PLAN:    ICD-10-CM   1. Preoperative cardiovascular examination  Z01.810    LOW risk for above surgery, advise proceeding.    2. Mixed hyperlipidemia  E78.2     3. Essential hypertension, benign  I10     4. 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 CAD in native artery  I25.10     6. 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     Suspected DVT (deep vein thrombosis)       U/S was negative for DVT          Disposition:   Return in about 3 months (around 08/29/2024).    Total time spent: 40 minutes  Signed,  Denyse Bathe,  MD  05/31/2024 1:45 PM    [facility]

## 2024-06-01 ENCOUNTER — Ambulatory Visit: Payer: PRIVATE HEALTH INSURANCE | Admitting: Internal Medicine

## 2024-06-03 ENCOUNTER — Telehealth: Payer: Self-pay

## 2024-06-03 NOTE — Telephone Encounter (Signed)
 Pharmacy called and patient wants to pick up her meds one day early in case of bad weather tomorrow. They need someone to call to let them know it is ok to do.

## 2024-06-08 ENCOUNTER — Telehealth: Payer: Self-pay

## 2024-06-08 ENCOUNTER — Ambulatory Visit: Payer: PRIVATE HEALTH INSURANCE | Admitting: Internal Medicine

## 2024-06-08 NOTE — Telephone Encounter (Signed)
 Patient called and states Jacqueline Thornton told her that if she called in we could schedule her for facets. There is no order. Do you want to order or do you want to bring her in? Please advise

## 2024-06-16 ENCOUNTER — Encounter: Payer: Self-pay | Admitting: Pain Medicine

## 2024-06-16 ENCOUNTER — Ambulatory Visit: Admitting: Pain Medicine

## 2024-06-16 VITALS — BP 151/83 | HR 80 | Temp 97.3°F | Resp 16 | Ht 60.0 in | Wt 128.0 lb

## 2024-06-16 DIAGNOSIS — M9904 Segmental and somatic dysfunction of sacral region: Secondary | ICD-10-CM | POA: Insufficient documentation

## 2024-06-16 DIAGNOSIS — G8929 Other chronic pain: Secondary | ICD-10-CM | POA: Diagnosis present

## 2024-06-16 DIAGNOSIS — M47816 Spondylosis without myelopathy or radiculopathy, lumbar region: Secondary | ICD-10-CM | POA: Insufficient documentation

## 2024-06-16 DIAGNOSIS — T85848S Pain due to other internal prosthetic devices, implants and grafts, sequela: Secondary | ICD-10-CM | POA: Insufficient documentation

## 2024-06-16 DIAGNOSIS — M545 Low back pain, unspecified: Secondary | ICD-10-CM | POA: Insufficient documentation

## 2024-06-16 DIAGNOSIS — M4696 Unspecified inflammatory spondylopathy, lumbar region: Secondary | ICD-10-CM | POA: Insufficient documentation

## 2024-06-16 DIAGNOSIS — M5459 Other low back pain: Secondary | ICD-10-CM | POA: Diagnosis present

## 2024-06-16 DIAGNOSIS — M4317 Spondylolisthesis, lumbosacral region: Secondary | ICD-10-CM | POA: Diagnosis present

## 2024-06-16 DIAGNOSIS — M461 Sacroiliitis, not elsewhere classified: Secondary | ICD-10-CM | POA: Insufficient documentation

## 2024-06-16 DIAGNOSIS — M4316 Spondylolisthesis, lumbar region: Secondary | ICD-10-CM | POA: Diagnosis present

## 2024-06-16 DIAGNOSIS — R937 Abnormal findings on diagnostic imaging of other parts of musculoskeletal system: Secondary | ICD-10-CM | POA: Insufficient documentation

## 2024-06-16 NOTE — Patient Instructions (Signed)
 ______________________________________________________________________    Procedure instructions  Stop blood-thinners  Do not eat or drink fluids (other than water ) for 6 hours before your procedure  No water  for 2 hours before your procedure  Take your blood pressure medicine with a sip of water   Arrive 30 minutes before your appointment  If sedation is planned, bring suitable driver. [place], [place], & public transportation are NOT APPROVED)  Carefully read the Preparing for your procedure detailed instructions  If you have questions call us  at [phone]  Procedure appointments are for procedures only.   NO medication refills or new problem evaluations will be done on procedure days.   Only the scheduled, pre-approved procedure and side will be done.   ______________________________________________________________________     ______________________________________________________________________    Preparing for your procedure  Appointments: If you think you may not be able to keep your appointment, call 24-48 hours in advance to cancel. We need time to make it available to others.  Procedure visits are for procedures only. During your procedure appointment there will be: NO Prescription Refills*. NO medication changes or discussions*. NO discussion of disability issues*. NO unrelated pain problem evaluations*. NO evaluations to order other pain procedures*. *These will be addressed at a separate and distinct evaluation encounter on the provider's evaluation schedule and not during procedure days.  Instructions: Food intake: Avoid eating anything solid for at least 8 hours prior to your procedure. Clear liquid intake: You may take clear liquids such as water  up to 2 hours prior to your procedure. (No carbonated drinks. No soda.) Transportation: Unless otherwise stated by your physician, bring a driver. (Driver cannot be a Market researcher, Pharmacist, community, or any other form of public  transportation.) Morning Medicines: Except for blood thinners, take all of your other morning medications with a sip of water . Make sure to take your heart and blood pressure medicines. If your blood pressure's lower number is above 100, the case will be rescheduled. Blood thinners: Make sure to stop your blood thinners as instructed.  If you take a blood thinner, but were not instructed to stop it, call our office [phone] and ask to talk to a nurse. Not stopping a blood thinner prior to certain procedures could lead to serious complications. Diabetics on insulin : Notify the staff so that you can be scheduled 1st case in the morning. If your diabetes requires high dose insulin , take only  of your normal insulin  dose the morning of the procedure and notify the staff that you have done so. Preventing infections: Shower with an antibacterial soap the morning of your procedure.  Build-up your immune system: Take 1000 mg of Vitamin C with every meal (3 times a day) the day prior to your procedure. Antibiotics: Inform the nursing staff if you are taking any antibiotics or if you have any conditions that may require antibiotics prior to procedures. (Example: recent joint implants)   Pregnancy: If you are pregnant make sure to notify the nursing staff. Not doing so may result in injury to the fetus, including death.  Sickness: If you have a cold, fever, or any active infections, call and cancel or reschedule your procedure. Receiving steroids while having an infection may result in complications. Arrival: You must be in the facility at least 30 minutes prior to your scheduled procedure. Tardiness: Your scheduled time is also the cutoff time. If you do not arrive at least 15 minutes prior to your procedure, you will be rescheduled.  Children: Do not bring any children with  you. Make arrangements to keep them home. Dress appropriately: There is always a possibility that your clothing may get soiled. Avoid  long dresses. Valuables: Do not bring any jewelry or valuables.  Reasons to call and reschedule or cancel your procedure: (Following these recommendations will minimize the risk of a serious complication.) Surgeries: Avoid having procedures within 2 weeks of any surgery. (Avoid for 2 weeks before or after any surgery). Flu Shots: Avoid having procedures within 2 weeks of a flu shots or . (Avoid for 2 weeks before or after immunizations). Barium: Avoid having a procedure within 7-10 days after having had a radiological study involving the use of radiological contrast. (Myelograms, Barium swallow or enema study). Heart attacks: Avoid any elective procedures or surgeries for the initial 6 months after a Myocardial Infarction (Heart Attack). Blood thinners: It is imperative that you stop these medications before procedures. Let us  know if you if you take any blood thinner.  Infection: Avoid procedures during or within two weeks of an infection (including chest colds or gastrointestinal problems). Symptoms associated with infections include: Localized redness, fever, chills, night sweats or profuse sweating, burning sensation when voiding, cough, congestion, stuffiness, runny nose, sore throat, diarrhea, nausea, vomiting, cold or Flu symptoms, recent or current infections. It is specially important if the infection is over the area that we intend to treat. Heart and lung problems: Symptoms that may suggest an active cardiopulmonary problem include: cough, chest pain, breathing difficulties or shortness of breath, dizziness, ankle swelling, uncontrolled high or unusually low blood pressure, and/or palpitations. If you are experiencing any of these symptoms, cancel your procedure and contact your primary care physician for an evaluation.  Remember:  Regular Business hours are:  Monday to Thursday 8:00 AM to 4:00 PM  Provider's Schedule: Eric Como, MD:  Procedure days: Tuesday and Thursday 7:30  AM to 4:00 PM  Wallie Sherry, MD:  Procedure days: Monday and Wednesday 7:30 AM to 4:00 PM Last  Updated: 06/10/2023 ______________________________________________________________________     ______________________________________________________________________    General Risks and Possible Complications  Patient Responsibilities: It is important that you read this as it is part of your informed consent. It is our duty to inform you of the risks and possible complications associated with treatments offered to you. It is your responsibility as a patient to read this and to ask questions about anything that is not clear or that you believe was not covered in this document.  Patient's Rights: You have the right to refuse treatment. You also have the right to change your mind, even after initially having agreed to have the treatment done. However, under this last option, if you wait until the last second to change your mind, you may be charged for the materials used up to that point.  Introduction: Medicine is not an Visual merchandiser. Everything in Medicine, including the lack of treatment(s), carries the potential for danger, harm, or loss (which is by definition: Risk). In Medicine, a complication is a secondary problem, condition, or disease that can aggravate an already existing one. All treatments carry the risk of possible complications. The fact that a side effects or complications occurs, does not imply that the treatment was conducted incorrectly. It must be clearly understood that these can happen even when everything is done following the highest safety standards.  No treatment: You can choose not to proceed with the proposed treatment alternative. The "PRO(s)" would include: avoiding the risk of complications associated with the therapy. The "CON(s)" would include:  not getting any of the treatment benefits. These benefits fall under one of three categories: diagnostic; therapeutic; and/or  palliative. Diagnostic benefits include: getting information which can ultimately lead to improvement of the disease or symptom(s). Therapeutic benefits are those associated with the successful treatment of the disease. Finally, palliative benefits are those related to the decrease of the primary symptoms, without necessarily curing the condition (example: decreasing the pain from a flare-up of a chronic condition, such as incurable terminal cancer).  General Risks and Complications: These are associated to most interventional treatments. They can occur alone, or in combination. They fall under one of the following six (6) categories: no benefit or worsening of symptoms; bleeding; infection; nerve damage; allergic reactions; and/or death. No benefits or worsening of symptoms: In Medicine there are no guarantees, only probabilities. No healthcare provider can ever guarantee that a medical treatment will work, they can only state the probability that it may. Furthermore, there is always the possibility that the condition may worsen, either directly, or indirectly, as a consequence of the treatment. Bleeding: This is more common if the patient is taking a blood thinner, either prescription or over the counter (example: Goody Powders, Fish oil, Aspirin, Garlic, etc.), or if suffering a condition associated with impaired coagulation (example: Hemophilia, cirrhosis of the liver, low platelet counts, etc.). However, even if you do not have one on these, it can still happen. If you have any of these conditions, or take one of these drugs, make sure to notify your treating physician. Infection: This is more common in patients with a compromised immune system, either due to disease (example: diabetes, cancer, human immunodeficiency virus [HIV], etc.), or due to medications or treatments (example: therapies used to treat cancer and rheumatological diseases). However, even if you do not have one on these, it can still  happen. If you have any of these conditions, or take one of these drugs, make sure to notify your treating physician. Nerve Damage: This is more common when the treatment is an invasive one, but it can also happen with the use of medications, such as those used in the treatment of cancer. The damage can occur to small secondary nerves, or to large primary ones, such as those in the spinal cord and brain. This damage may be temporary or permanent and it may lead to impairments that can range from temporary numbness to permanent paralysis and/or brain death. Allergic Reactions: Any time a substance or material comes in contact with our body, there is the possibility of an allergic reaction. These can range from a mild skin rash (contact dermatitis) to a severe systemic reaction (anaphylactic reaction), which can result in death. Death: In general, any medical intervention can result in death, most of the time due to an unforeseen complication. ______________________________________________________________________      ______________________________________________________________________    Steroid injections  Common steroids for injections Triamcinolone: Used by many sports medicine physicians for large joint and bursal injections, often combined with a local anesthetic like lidocaine . A study focusing on coccydynia (tailbone pain) found triamcinolone was more effective than betamethasone , suggesting it may also be preferable for other localized inflammation conditions. Methylprednisolone: A common alternative to triamcinolone that is also a strong anti-inflammatory. It is available in different formulations, with the acetate suspension being the long-acting option for intra-articular injections. Dexamethasone : This is a non-particulate steroid, meaning it has a lower risk of tissue damage compared to particulate steroids like triamcinolone and methylprednisolone. While less common for this specific  use,  it is an option for targeted injections.   Considerations for physicians Particulate vs. non-particulate steroids: Triamcinolone and methylprednisolone are particulate, meaning they can clump together. Dexamethasone  is non-particulate. Particulate steroids are often preferred for their longer-lasting effects but carry a theoretical higher risk for certain injections (though this is less of a concern in the costochondral joints). Combined injectate: Corticosteroids are typically mixed with a local anesthetic like lidocaine  to provide both immediate pain relief (from the anesthetic) and longer-term inflammation reduction (from the steroid). Imaging guidance: To ensure accurate placement of the needle and medication, physicians may use ultrasound or fluoroscopic guidance for the injection, especially in complex or refractory cases.   Patient guidance Before undergoing a steroid injection, discuss the options with your physician. They will determine the best steroid, dosage, and procedure for your specific case based on factors like: Severity of your condition History of response to other treatments Your overall health status Experience and preference of the physician  Last  Updated: 02/24/2024 ______________________________________________________________________

## 2024-06-16 NOTE — Progress Notes (Signed)
 Safety precautions to be maintained throughout the outpatient stay will include: orient to surroundings, keep bed in low position, maintain call bell within reach at all times, provide assistance with transfer out of bed and ambulation.

## 2024-06-16 NOTE — Progress Notes (Signed)
 PROVIDER NOTE: Interpretation of information contained herein should be left to medically-trained personnel. Specific patient instructions are provided elsewhere under Patient Instructions section of medical record. This document was created in part using AI and STT-dictation technology, any transcriptional errors that may result from this process are unintentional.  Patient: Jacqueline Thornton  Service: E/M   PCP: Fernand Fredy RAMAN, MD  DOB: 06-10-1951  DOS: 06/16/2024  Provider: Eric DELENA Como, MD  MRN: [id number]  Delivery: Face-to-face  Specialty: Interventional Pain Management  Type: Established Patient  Setting: Ambulatory outpatient facility  Specialty designation: 09  Referring Prov.: Fernand Fredy RAMAN, MD  Location: Outpatient office facility       History of present illness (HPI) [name], a 73 y.o. year old female, is here today because of her Facet hypertrophy of lumbar region [M47.816]. Ms. Frakes's primary complain today is Back Pain (Lower right ), Hip Pain (Right ), and Leg Pain (Right )  Pertinent problems: [name] has Chronic pain syndrome; Headache, migraine; Chronic low back pain (Bilateral) (R>L) w/o sciatica; Spondylosis of lumbar spine; Lumbar annular disc tear (L4-5); Discogenic low back pain (L3-4 and L4-5); Lumbar facet hypertrophy; Lumbar facet syndrome (Bilateral) (R>L); Chronic neck pain (Left); Cervical spondylosis; Hx of cervical spine surgery; Cervical spinal fusion (C6-7 interbody fusion); Chronic sacroiliac joint pain (Bilateral) (L>R); Chronic lumbar radicular pain (Left); Trochanteric bursitis of hip (Right); Chronic hip pain (Bilateral) (L>R); Neurogenic pain; Myofascial pain; Osteoarthritis of hip (Left); Arthrodesis status; DDD (degenerative disc disease), lumbar; Myalgia; Trochanteric bursitis of hip (Bilateral) (L>R); Spondylosis without myelopathy or radiculopathy, lumbosacral region; Spondylosis without myelopathy or radiculopathy, sacral and  sacrococcygeal region; Other specified dorsopathies, sacral and sacrococcygeal region; Age-related osteoporosis without current pathological fracture; Bone island of right femur; Inflammatory spondylopathy of lumbar region; Trigger point with back pain (Right); Fibromyalgia; DDD (degenerative disc disease), cervical; Spondylolisthesis of lumbar region; History of lumbar spinal fusion (Dr. Fairy Levels) (12/11/2018); Failed back surgical syndrome; Unilateral occipital headache (Left); Cervicogenic headache (Left); Chronic hip pain (Left); Spondylolisthesis at L5-S1 level; Spondylosis of lumbosacral region without myelopathy or radiculopathy; Epidural fibrosis; Enthesopathy of hip region (Left); Chronic sacroiliac joint pain (Left); Enthesopathy of sacroiliac joint (Left); Somatic dysfunction of sacroiliac joint (Left); Herniated cervical disc without myelopathy; Chronic sacroiliac joint pain (Right); Osteoarthritis of sacroiliac joint (Right) (HCC); Somatic dysfunction of sacroiliac joint (Right); Chronic hip pain (Right); Neck pain; Herniated nucleus pulposus, C3-4; Prolapsed cervical intervertebral disc; Cervical spinal stenosis; Radiculopathy, cervical region; Lumbar central spinal stenosis, 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  Pain Assessment: Severity of Chronic pain is reported as a 6 /10. Location: Back Lower, Right/From lower right back into right hip into right posterior leg into right  calf. Onset: More than a month ago. Quality: Burning, Constant, Stabbing, Sharp. Timing: Constant. Modifying factor(s): Pain medication, tylenol , and ibuprofen . Vitals:  height is 5' (1.524 m) and weight is 128 lb (58.1 kg). Her temporal temperature is 97.3 F (36.3 C) (abnormal). Her blood pressure is 151/83 (abnormal) and her pulse is 80. Her respiration is 16 and oxygen saturation is 100%.  BMI: Estimated  body mass index is 25 kg/m as calculated from the following:   Height as of this encounter: 5' (1.524 m).   Weight as of this encounter: 128 lb (58.1 kg).  Last encounter: 11/03/2023. Last procedure: 11/03/2023.  Reason for encounter: evaluation of worsening, or previously known (established) problem.    Note: The patient is currently crying, but it seems to be due to an unfortunate event where her grandson who lives in [place]  fell off of the tree and apparently he is currently hospitalized in bad shape.  Discussed the use of AI scribe software for clinical note transcription with the patient, who gave verbal consent to proceed.  History of Present Illness   Jacqueline Thornton is a 73 year old female with lumbar fusion and sacroiliitis who presents with a flare-up of low back pain.  She has recurrent right-predominant low back pain after prior lumbar fusion. Previous pedicle screw hardware injections have given strong benefit, with the last on December 17, 2022 producing 100% relief during anesthetic and about 75% improvement on the right and 50% on the left at follow-up.  She previously had three right sacroiliac joint injections, with the last on June 17, 2017 giving 100% anesthetic relief and about 80% sustained improvement at follow-up.  Her current flare began 7 weeks ago after vacuuming. Pain is mainly on the right in the facet region, radiating to the buttock and down to the calf without foot involvement. She attributes some discomfort to underlying bursitis and arthritis.  She now  also has milder pain on the left side, with leg pain again stopping at the calf and not reaching the foot.       Pharmacotherapy Assessment   Oxycodone /APAP 7.5/325, 1 tab PO q 5 times a day. (56.25 mg/day of oxycodone ) MME/day: 56.25 mg/day.   Monitoring: [place] PMP: PDMP reviewed during this encounter.       Pharmacotherapy: No side-effects or adverse reactions reported. Compliance: No problems identified. Effectiveness: Clinically acceptable.  Bonner Norris, RN  06/16/2024  1:03 PM  Sign when Signing Visit Safety precautions to be maintained throughout the outpatient stay will include: orient to surroundings, keep bed in low position, maintain call bell within reach at all times, provide assistance with transfer out of bed and ambulation.     UDS:  Summary  Date Value Ref Range Status  02/02/2024 FINAL  Final    Comment:    ==================================================================== [employer] [facility] (MW) ==================================================================== Test                             Result       Flag       Units  Drug Present and Declared for Prescription Verification   Oxycodone                       2583         EXPECTED   ng/mg creat   Oxymorphone                    679          EXPECTED   ng/mg creat   Noroxycodone                   >5587        EXPECTED   ng/mg creat   Noroxymorphone  787          EXPECTED   ng/mg creat    Sources of oxycodone  are scheduled prescription medications.    Oxymorphone, noroxycodone, and noroxymorphone are expected    metabolites of oxycodone . Oxymorphone is also available as a    scheduled prescription medication.  Drug Absent but Declared for Prescription Verification   Clonazepam                     Not Detected UNEXPECTED ng/mg creat ==================================================================== Test                      Result    Flag   Units      Ref Range   Creatinine               179              mg/dL      >=79 ==================================================================== Declared Medications:  The flagging and interpretation on this report are based on the  following declared medications.  Unexpected results may arise from  inaccuracies in the declared medications.   **Note: The testing scope of this panel includes these medications:   Clonazepam (Rivotril)  Oxycodone  (Percocet)   **Note: The testing scope of this panel does not include the  following reported medications:   Acetaminophen  (Percocet)  Atorvastatin  (Lipitor)  Calcium   Docusate (Colace)  Enalapril  (Vasotec )  Estradiol  (Estrace )  Furosemide  (Lasix )  Gabapentin  (Neurontin )  Magnesium  Melatonin  Methocarbamol  (Robaxin )  Mirtazapine  (Remeron )  Naloxone  (Narcan )  Omeprazole (Prilosec)  Ondansetron  (Zofran )  Spironolactone  (Aldactone )  Supplement  Vitamin D  ==================================================================== For clinical consultation, please call [phone]. ====================================================================     No results found for: CBDTHCR No results found for: D8THCCBX No results found for: D9THCCBX  ROS  Constitutional: Denies any fever or chills Gastrointestinal: No reported hemesis, hematochezia, vomiting, or acute GI distress Musculoskeletal: Denies any acute onset joint swelling, redness, loss of ROM, or weakness Neurological: No reported episodes of acute onset apraxia, aphasia, dysarthria, agnosia, amnesia, paralysis, loss of coordination, or loss of consciousness  Medication Review  Magnesium, Melatonin, atorvastatin , docusate sodium , enalapril , estradiol , furosemide , gabapentin , methocarbamol , mirtazapine , multivitamin with minerals, naloxone , omeprazole, ondansetron , oxyCODONE -acetaminophen , and spironolactone   History Review  Allergy: Ms. Spong has no known allergies. Drug: Ms. Nayak  reports no history of  drug use. Alcohol:  reports no history of alcohol use. Tobacco:  reports that she quit smoking about 20 years ago. Her smoking use included cigarettes. She has never used smokeless tobacco. Social: Ms. Courser  reports that she quit smoking about 20 years ago. Her smoking use included cigarettes. She has never used smokeless tobacco. She reports that she does not drink alcohol and does not use drugs. 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 Surgical: Ms. Mustin  has a past surgical history that includes Cholecystectomy; Abdominal hysterectomy; Neck surgery; Colonoscopy with propofol  (N/A, 07/19/2015); caract surger; Laparoscopic salpingo oophorectomy (Bilateral, 03/03/2018); Cystoscopy (03/03/2018); Tonsillectomy; Appendectomy; Tumor removal; Anterior lumbar fusion (N/A, 12/11/2018); Abdominal exposure (N/A, 12/11/2018); Anterior  cervical decomp/discectomy fusion (N/A, 10/06/2020); Toe Fusion (Right); Esophagogastroduodenoscopy (egd) with propofol  (N/A, 09/11/2021); Colonoscopy with propofol  (N/A, 09/11/2021); Colonoscopy with propofol  (N/A, 03/05/2022); and Transforaminal lumbar interbody fusion w/ mis 1 level (Right, 04/30/2022). Family: family history includes Colon cancer in her maternal grandmother and mother; Diabetes in her father and mother; Hypertension in her sister;  Stroke in her mother.  Laboratory Chemistry Profile   Renal Lab Results  Component Value Date   BUN 9 03/02/2024   CREATININE 1.01 (H) 03/02/2024   BCR 9 (L) 03/02/2024   GFRAA 47 (L) 12/16/2019   GFRNONAA >60 04/24/2022    Hepatic Lab Results  Component Value Date   AST 18 03/02/2024   ALT 7 03/02/2024   ALBUMIN 3.7 (L) 03/02/2024   ALKPHOS 75 03/02/2024    Electrolytes Lab Results  Component Value Date   NA 140 03/02/2024   K 4.4 03/02/2024   CL 104 03/02/2024   CALCIUM  8.9 03/02/2024   MG 2.4 (H) 12/16/2019    Bone Lab Results  Component Value Date   VD25OH 33.3 09/16/2023   25OHVITD1 53 12/16/2019   25OHVITD2 <1.0 12/16/2019   25OHVITD3 53 12/16/2019    Inflammation (CRP: Acute Phase) (ESR: Chronic Phase) Lab Results  Component Value Date   CRP 4 12/16/2019   ESRSEDRATE 6 12/16/2019         Note: Above Lab results reviewed.  Recent Imaging Review  US  Venous Img Lower Unilateral Left CLINICAL DATA:  History of prior DVT. Recent postop. Pain and swelling behind LEFT knee.  EXAM: LEFT LOWER EXTREMITY VENOUS DOPPLER ULTRASOUND  TECHNIQUE: Gray-scale sonography with compression, as well as color and duplex ultrasound, were performed to evaluate the deep venous system(s) from the level of the common femoral vein through the popliteal and proximal calf veins.  COMPARISON:  05/17/2021  FINDINGS: VENOUS  Normal compressibility of the common femoral, superficial femoral, and popliteal veins, as well as the visualized calf veins. Visualized portions of profunda femoral vein and great saphenous vein unremarkable. No filling defects to suggest DVT on grayscale or color Doppler imaging. Doppler waveforms show normal direction of venous flow, normal respiratory plasticity and response to augmentation.  Limited views of the contralateral common femoral vein are unremarkable.  OTHER  None.  Limitations: none  IMPRESSION: No DVT of the left lower  extremity.  Electronically Signed   By: Aliene Lloyd M.D.   On: 03/02/2024 14:00 Note: Reviewed        Physical Exam  Vitals: BP (!) 151/83 (Patient Position: Sitting, Cuff Size: Normal)   Pulse 80   Temp (!) 97.3 F (36.3 C) (Temporal)   Resp 16   Ht 5' (1.524 m)   Wt 128 lb (58.1 kg)   SpO2 100%   BMI 25.00 kg/m  BMI: Estimated body mass index is 25 kg/m as calculated from the following:   Height as of this encounter: 5' (1.524 m).   Weight as of this encounter: 128 lb (58.1 kg). Ideal: Ideal body weight: 45.5 kg (100 lb 4.9 oz) Adjusted ideal body weight: 50.5 kg (111 lb 6.2 oz) General appearance: Well nourished, well developed, and well hydrated. In no apparent acute distress Mental status: Alert, oriented x 3 (person, place, & time)       Respiratory: No evidence of acute respiratory distress Eyes: PERLA   Assessment   Diagnosis Status  1. Lumbar facet hypertrophy   2. Lumbar facet joint pain   3. Lumbar facet syndrome (Bilateral) (R>L)  4. Osteoarthritis of sacroiliac joint (Right) (HCC)   5. Somatic dysfunction of sacroiliac joint (Right)   6. Spondylolisthesis at L5-S1 level   7. Pain from implanted hardware, sequela   8. Inflammatory spondylopathy of lumbar region   9. Chronic low back pain (Bilateral) (R>L) w/o sciatica   10. Anterolisthesis of lumbar spine (L4/L5)   11. Abnormal MRI, lumbar spine & Sacrum (01/28/2022)    Controlled Controlled Controlled   Updated Problems: Problem  Inflammation of Sacroiliac Joint    Plan of Care  Problem-specific:  Assessment and Plan    Chronic low back pain with lumbar facet arthropathy and pain from spinal hardware   She is experiencing a flare-up of chronic low back pain with lumbar facet arthropathy and pain from spinal hardware. The pain is primarily on the right side, radiating to the buttocks and calf, but not reaching the foot. A previous hardware block provided significant relief, with 100% relief  during local anesthetic and 75% improvement on the right side and 50% on the left side at follow-up. Recent activity, such as vacuuming, has exacerbated the current pain. Scheduled bilateral facet joint injections, with adjustments based on symptoms on the day of the procedure.  Right sacroiliac joint osteoarthritis and sacroiliitis   She has right sacroiliac joint osteoarthritis and sacroiliitis, with previous injections providing 100% relief during local anesthetic and 80% improvement at follow-up. A current flare-up presents with pain in the right buttocks and calf, possibly related to sacroiliac joint issues. Surgical intervention for sacroiliac joint fusion is under consideration but delayed due to personal circumstances. Scheduled bilateral sacroiliac joint injections, with adjustments based on symptoms on the day of the procedure.       Ms. FLO BERROA has a current medication list which includes the following long-term medication(s): atorvastatin , enalapril , estradiol , furosemide , gabapentin , mirtazapine , naloxone , omeprazole, oxycodone -acetaminophen , oxycodone -acetaminophen , [START ON 07/04/2024] oxycodone -acetaminophen , and spironolactone .  Pharmacotherapy (Medications Ordered): No orders of the defined types were placed in this encounter.  Orders:  Orders Placed This Encounter  Procedures   LUMBAR FACET(MEDIAL BRANCH NERVE BLOCK) MBNB    Diagnosis: Lumbar Facet Syndrome (M47.816); Lumbosacral Facet Syndrome (M47.817); Lumbar Facet Joint Pain (M54.59) Medical Necessity Statement: 1.Severe chronic axial low back pain causing functional impairment documented by ongoing pain scale assessments. 2.Pain present for longer than 3 months (Chronic) documented to have failed noninvasive conservative therapies. 3.Absence of untreated radiculopathy. 4.There is no radiological evidence of untreated fractures, tumor, infection, or deformity.  Physical Examination Findings: Positive Kemp  Maneuver: (Y)  Positive Lumbar Hyperextension-Rotation provocative test: (Y)    Standing Status:   Future    Expiration Date:   09/14/2024    Scheduling Instructions:     Procedure: Lumbar facet Block     Type: Medial Branch Block     Side: Bilateral     Purpose: Therapeutic     Level(s): L3-4, L4-5, and L5-S1 Facets (L2, L3, L4, L5, and S1 Medial Branch)     Sedation: With Sedation.     Timeframe: ASAP    Where will this procedure be performed?:   [facility]   SACROILIAC JOINT INJECTION    Physical Examination Findings: Positive Sacral Thrust (Sacral Spring, Downward Pressure): (Y) Positive FABER maneuver (Patrick's): (Y) Positive SI distraction (Gapping): (Y) Positive SI compression (Approximation): (Y) Positive Thigh Thrust:  (Y) Positive Gaenslen's: (Y) Positive Sacral Sulcus Tenderness: (Y)    Standing Status:   Future    Expiration Date:   09/14/2024    Scheduling Instructions:  Procedure: Sacroiliac Joint Injection     Side  Laterality: Right-sided     Sedation: With Sedation.     Timeframe: ASAP    Where will this procedure be performed?:   [facility]     Interventional Therapies  Risk Factors  Considerations:     Lumbar HARDWARE (NO RFA)     Planned  Pending:   Therapeutic bilateral lumbar MBB (pedicle screw hardware inj.) #2  Therapeutic right SI block #4    Under consideration:   Diagnostic bilateral lumbar MBB (pedicle screw hardware inj.) #2 Diagnostic/therapeutic midline caudal ESI #2 + sacral interspinous ligament MNB/TPI #1  Diagnostic lumbar SCS trial    Completed:   Diagnostic bilateral lumbar MBB (pedicle screw hardware inj.) x1 (12/17/2022) (100/100/100/R:75L:50)  Diagnostic bilateral L4 TFESI x1 (01/08/2022) (100/25/0)  Therapeutic right Racz procedure x1 (08/17/2020) (04/09/84/85) (100% relief of the right LEP) Diagnostic caudal ESI x1 (07/11/2020) (100/100/0/0)  Palliative left L5-S1 LESI x2 (07/27/2015) (70/70/0/0)   Diagnostic left lumbar facet MBB x7 (02/07/2022) (20/20/0/0)  Palliative right lumbar facet MBB x10 (02/07/2022) (20/20/0/0)  Therapeutic right shoulder joint inj. x1 (02/29/2016) (90/80/30)  Therapeutic left IA hip injection x3 (06/13/2020) (100/100/30/>50)  Diagnostic right SI joint Blk x3 (06/17/2017) (100/90/80/80)  Diagnostic/therapeutic left SI joint Blk x4 (06/17/2017) (100/90/80/80)  Palliative bilateral trochanteric bursa inj. x1 (12/11/2016) (100/100/90/90)  Therapeutic right lumbar facet RFA x2 (01/11/2021) (100/100/70)  Therapeutic left lumbar facet RFA x2 (12/19/2020) (100/100/75/>75) Therapeutic right SI joint RFA x1 (08/07/2017) (100/100/80)  Therapeutic left SI joint RFA x1 (09/16/2017) (100/100/100)    Therapeutic  Palliative (PRN) options:   Palliative Racz procedure  Palliative bilateral SI joint block  Palliative right trochanteric bursa injection    Pharmacotherapy  Nonopioids transferred 05/24/2020: Vitamin B12, Flexeril , and Neurontin .      Return for (ECT):(B) Lumbar hardware Blk #2 + (R) SI Blk #4.    Recent Visits Date Type Provider Dept  05/03/24 Office Visit Patel, Seema K, NP [facility]  Showing recent visits within past 90 days and meeting all other requirements Today's Visits Date Type Provider Dept  06/16/24 Office Visit Tanya Glisson, MD [facility]  Showing today's visits and meeting all other requirements Future Appointments Date Type Provider Dept  08/03/24 Appointment Patel, Seema K, NP [facility]  Showing future appointments within next 90 days and meeting all other requirements  I discussed the assessment and treatment plan with the patient. The patient was provided an opportunity to ask questions and all were answered. The patient agreed with the plan and demonstrated an understanding of the instructions.  Patient advised to call back or seek an in-person evaluation if the symptoms or condition  worsens.  Duration of encounter: 30 minutes.  Total time on encounter, as per AMA guidelines included both the face-to-face and non-face-to-face time personally spent by the physician and/or other qualified health care professional(s) on the day of the encounter (includes time in activities that require the physician or other qualified health care professional and does not include time in activities normally performed by clinical staff). Physician's time may include the following activities when performed: Preparing to see the patient (e.g., pre-charting review of records, searching for previously ordered imaging, lab work, and nerve conduction tests) Review of prior analgesic pharmacotherapies. Reviewing PMP Interpreting ordered tests (e.g., lab work, imaging, nerve conduction tests) Performing post-procedure evaluations, including interpretation of diagnostic procedures Obtaining and/or reviewing separately obtained history Performing a medically appropriate examination and/or evaluation Counseling and educating the  patient/family/caregiver Ordering medications, tests, or procedures Referring and communicating with other health care professionals (when not separately reported) Documenting clinical information in the electronic or other health record Independently interpreting results (not separately reported) and communicating results to the patient/ family/caregiver Care coordination (not separately reported)  Note by: Eric DELENA Como, MD (TTS and AI technology used. I apologize for any typographical errors that were not detected and corrected.) Date: 06/16/2024; Time: 1:25 PM

## 2024-07-15 ENCOUNTER — Ambulatory Visit: Admitting: Pain Medicine

## 2024-07-21 ENCOUNTER — Telehealth: Payer: Self-pay | Admitting: Pain Medicine

## 2024-07-21 NOTE — Telephone Encounter (Signed)
 Patient canceled appt for next week. And they do not want her to have steroids this week.

## 2024-07-21 NOTE — Telephone Encounter (Signed)
 Patient cancelled appointment for 07/22/24 to have ECT):(B) Lumbar hardware Blk #2 + (R) SI Blk #4.  She is having another procedure, that interfered and will call back to reschedule. She is having SI surgery next week, she was told not to have any steroids 7 days prior

## 2024-07-22 ENCOUNTER — Ambulatory Visit: Admitting: Pain Medicine

## 2024-07-26 ENCOUNTER — Other Ambulatory Visit: Payer: Self-pay | Admitting: Surgery

## 2024-07-26 DIAGNOSIS — M47812 Spondylosis without myelopathy or radiculopathy, cervical region: Secondary | ICD-10-CM

## 2024-08-01 NOTE — Progress Notes (Unsigned)
 PROVIDER NOTE: Interpretation of information contained herein should be left to medically-trained personnel. Specific patient instructions are provided elsewhere under Patient Instructions section of medical record. This document was created in part using AI and STT-dictation technology, any transcriptional errors that may result from this process are unintentional.  Patient: Jacqueline Thornton  Service: E/M   PCP: Fernand Fredy RAMAN, MD  DOB: 13-Oct-1950  DOS: 08/03/2024  Provider: Emmy MARLA Blanch, NP  MRN: [id number]  Delivery: Face-to-face  Specialty: Interventional Pain Management  Type: Established Patient  Setting: Ambulatory outpatient facility  Specialty designation: 09  Referring Prov.: Fernand Fredy RAMAN, MD  Location: Outpatient office facility       History of present illness (HPI) Jacqueline Thornton, a 74 y.o. year old female, is here today because of her No primary diagnosis found.. Ms. Bame's primary complain today is No chief complaint on file.  Pertinent problems: Ms. Sermons has Chronic pain syndrome; Chronic low back pain (Bilateral) (R>L) w/o sciatica; Spondylosis of lumbar spine; Lumbar annular disc tear (L4-5); Discogenic low back pain (L3-4 and L4-5); Lumbar facet hypertrophy; Lumbar facet syndrome (Bilateral) (R>L); Chronic neck pain (Left); Cervical spondylosis; Cervical spinal fusion (C6-7 interbody fusion); Coronary atherosclerosis; Long term current use of opiate analgesic; Opiate use (56.25 MME/Day); Chronic sacroiliac joint pain (Bilateral) (L>R); Chronic lumbar radicular pain (Left); Long term prescription opiate use; Trochanteric bursitis of hip (Right); Chronic hip pain (Bilateral) (L>R); Neurogenic pain; Myofascial pain; Osteoarthritis of hip (Left); DDD (degenerative disc disease), lumbar; Trochanteric bursitis of hip (Bilateral) (L>R); Spondylosis without myelopathy or radiculopathy, lumbosacral region; Spondylosis without myelopathy or radiculopathy, sacral and sacrococcygeal  region; Other specified dorsopathies, sacral and sacrococcygeal region; Inflammatory spondylopathy of lumbar region; Fibromyalgia; DDD (degenerative disc disease), cervical; Spondylolisthesis of lumbar region; Pharmacologic therapy; Disorder of skeletal system; Problems influencing health status; Failed back surgical syndrome; Unilateral occipital headache (Left); Cervicogenic headache (Left); Chronic hip pain (Left); Spondylolisthesis at L5-S1 level; Spondylosis of lumbosacral region without myelopathy or radiculopathy; Epidural fibrosis; and Enthesopathy of hip region (Left) on their pertinent problem list.  Pain Assessment: Severity of   is reported as a  /10. Location:    / . Onset:  . Quality:  . Timing:  . Modifying factor(s):  SABRA Vitals:  vitals were not taken for this visit.  BMI: Estimated body mass index is 25 kg/m as calculated from the following:   Height as of 06/16/24: 5' (1.524 m).   Weight as of 06/16/24: 128 lb (58.1 kg).  Last encounter: 05/03/2024. Last procedure: Visit date not found.  Reason for encounter:  *** .   Discussed the use of AI scribe software for clinical note transcription with the patient, who gave verbal consent to proceed.  History of Present Illness           Pharmacotherapy Assessment   Oxycodone -acetaminophen  (Percocet) 7.5-325 mg tablet 5 times daily as needed for pain. MME=56.25 Monitoring: [place] PMP: PDMP reviewed during this encounter.       Pharmacotherapy: No side-effects or adverse reactions reported. Compliance: No problems identified. Effectiveness: Clinically acceptable.  Dayna Pulling, RN  05/03/2024  1:00 PM  Sign when Signing Visit Nursing Pain Medication Assessment:  Safety precautions to be maintained throughout the outpatient stay will include: orient to surroundings, keep bed in low position, maintain call bell within reach at all times, provide assistance with transfer out of bed and ambulation.  Medication Inspection Compliance:  Pill count conducted under aseptic conditions, in front of the patient. Neither the pills  nor the bottle was removed from the patient's sight at any time. Once count was completed pills were immediately returned to the patient in their original bottle.  Medication: Oxycodone /APAP Pill/Patch Count: 9 of 150 pills/patches remain Pill/Patch Appearance: Markings consistent with prescribed medication Bottle Appearance: Standard pharmacy container. Clearly labeled. Filled Date: 10 / 06 / 2025 Last Medication intake:  Today    UDS:  Summary  Date Value Ref Range Status  02/02/2024 FINAL  Final    Comment:    ==================================================================== [employer] 13 (MW) ==================================================================== Test                             Result       Flag       Units  Drug Present and Declared for Prescription Verification   Oxycodone                       2583         EXPECTED   ng/mg creat   Oxymorphone                    679          EXPECTED   ng/mg creat   Noroxycodone                   >5587        EXPECTED   ng/mg creat   Noroxymorphone                 787          EXPECTED   ng/mg creat    Sources of oxycodone  are scheduled prescription medications.    Oxymorphone, noroxycodone, and noroxymorphone are expected    metabolites of oxycodone . Oxymorphone is also available as a    scheduled prescription medication.  Drug Absent but Declared for Prescription Verification   Clonazepam                     Not Detected UNEXPECTED ng/mg creat ==================================================================== Test                      Result    Flag   Units      Ref Range   Creatinine              179              mg/dL      >=79 ==================================================================== Declared Medications:  The flagging and interpretation on this report are based on the  following declared medications.   Unexpected results may arise from  inaccuracies in the declared medications.   **Note: The testing scope of this panel includes these medications:   Clonazepam (Rivotril)  Oxycodone  (Percocet)   **Note: The testing scope of this panel does not include the  following reported medications:   Acetaminophen  (Percocet)  Atorvastatin  (Lipitor)  Calcium   Docusate (Colace)  Enalapril  (Vasotec )  Estradiol  (Estrace )  Furosemide  (Lasix )  Gabapentin  (Neurontin )  Magnesium  Melatonin  Methocarbamol  (Robaxin )  Mirtazapine  (Remeron )  Naloxone  (Narcan )  Omeprazole (Prilosec)  Ondansetron  (Zofran )  Spironolactone  (Aldactone )  Supplement  Vitamin D  ==================================================================== For clinical consultation, please call [phone]. ====================================================================     No results found for: CBDTHCR No results found for: D8THCCBX No results found for: D9THCCBX  ROS  Constitutional: Denies any fever or chills Gastrointestinal: No reported hemesis, hematochezia, vomiting, or acute  GI distress Musculoskeletal: low back pain  Neurological: No reported episodes of acute onset apraxia, aphasia, dysarthria, agnosia, amnesia, paralysis, loss of coordination, or loss of consciousness  Medication Review  Calcium -Vitamin D -Vitamin K, Magnesium, Melatonin, atorvastatin , docusate sodium , enalapril , estradiol , furosemide , gabapentin , methocarbamol , mirtazapine , multivitamin with minerals, naloxone , omeprazole, ondansetron , oxyCODONE -acetaminophen , and spironolactone   History Review  Allergy: Ms. Kentner has no known allergies. Drug: Ms. Morace  reports no history of drug use. Alcohol:  reports no history of alcohol use. Tobacco:  reports that she quit smoking about 20 years ago. Her smoking use included cigarettes. She has never used smokeless tobacco. Social: Ms. Rosiles  reports that she quit smoking about 20 years  ago. Her smoking use included cigarettes. She has never used smokeless tobacco. She reports that she does not drink alcohol and does not use drugs. 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 Surgical: Ms. Pellerito  has a past surgical history that includes Cholecystectomy; Abdominal hysterectomy; Neck surgery; Colonoscopy with propofol  (N/A, 07/19/2015); caract surger; Laparoscopic salpingo oophorectomy (Bilateral, 03/03/2018); Cystoscopy (03/03/2018); Tonsillectomy; Appendectomy; Tumor removal; Anterior lumbar fusion (N/A, 12/11/2018); Abdominal exposure (N/A, 12/11/2018); Anterior cervical decomp/discectomy fusion (N/A, 10/06/2020); Toe Fusion (Right); Esophagogastroduodenoscopy (egd) with propofol  (N/A, 09/11/2021); Colonoscopy with propofol  (N/A, 09/11/2021); Colonoscopy with propofol  (N/A, 03/05/2022); and Transforaminal lumbar interbody fusion w/ mis 1 level (Right, 04/30/2022). Family: family history includes Colon cancer in her maternal grandmother and mother; Diabetes in her father and mother; Hypertension in her sister; Stroke in her mother.  Laboratory Chemistry Profile   Renal Lab Results  Component Value Date   BUN 9 03/02/2024   CREATININE 1.01 (H) 03/02/2024   BCR 9 (L) 03/02/2024   GFRAA 47 (L) 12/16/2019   GFRNONAA >60 04/24/2022    Hepatic Lab Results   Component Value Date   AST 18 03/02/2024   ALT 7 03/02/2024   ALBUMIN 3.7 (L) 03/02/2024   ALKPHOS 75 03/02/2024    Electrolytes Lab Results  Component Value Date   NA 140 03/02/2024   K 4.4 03/02/2024   CL 104 03/02/2024   CALCIUM  8.9 03/02/2024   MG 2.4 (H) 12/16/2019    Bone Lab Results  Component Value Date   VD25OH 33.3 09/16/2023   25OHVITD1 53 12/16/2019   25OHVITD2 <1.0 12/16/2019   25OHVITD3 53 12/16/2019    Inflammation (CRP: Acute Phase) (ESR: Chronic Phase) Lab Results  Component Value Date   CRP 4 12/16/2019   ESRSEDRATE 6 12/16/2019         Note: Above Lab results reviewed.  Recent Imaging Review  US  Venous Img Lower Unilateral Left CLINICAL DATA:  History of prior DVT. Recent postop. Pain and swelling behind LEFT knee.  EXAM: LEFT LOWER EXTREMITY VENOUS DOPPLER ULTRASOUND  TECHNIQUE: Gray-scale sonography with compression, as well as color and duplex ultrasound, were performed to evaluate the deep venous system(s) from the level of the common femoral vein through the popliteal and proximal calf veins.  COMPARISON:  05/17/2021  FINDINGS: VENOUS  Normal compressibility of the common femoral, superficial femoral, and popliteal veins, as well as the  visualized calf veins. Visualized portions of profunda femoral vein and great saphenous vein unremarkable. No filling defects to suggest DVT on grayscale or color Doppler imaging. Doppler waveforms show normal direction of venous flow, normal respiratory plasticity and response to augmentation.  Limited views of the contralateral common femoral vein are unremarkable.  OTHER  None.  Limitations: none  IMPRESSION: No DVT of the left lower extremity.  Electronically Signed   By: Aliene Lloyd M.D.   On: 03/02/2024 14:00 Note: Reviewed        Physical Exam  Vitals: BP 127/68   Pulse 74   Temp (!) 97.3 F (36.3 C)   Resp 16   Ht 5' (1.524 m)   Wt 123 lb (55.8 kg)   SpO2 100%   BMI  24.02 kg/m  BMI: Estimated body mass index is 24.02 kg/m as calculated from the following:   Height as of this encounter: 5' (1.524 m).   Weight as of this encounter: 123 lb (55.8 kg). Ideal: Ideal body weight: 45.5 kg (100 lb 4.9 oz) Adjusted ideal body weight: 49.6 kg (109 lb 6.2 oz) General appearance: Well nourished, well developed, and well hydrated. In no apparent acute distress Mental status: Alert, oriented x 3 (person, place, & time)       Respiratory: No evidence of acute respiratory distress Eyes: PERLA  Musculoskeletal: +LBP Assessment   Diagnosis Status  1. Chronic low back pain (Bilateral) (R>L) w/o sciatica   2. Chronic pain syndrome   3. Chronic use of opiate for therapeutic purpose   4. Chronic lower extremity pain (Bilateral) (R>L)   5. Chronic hip pain (Bilateral) (L>R)   6. Chronic sacroiliac joint pain (Bilateral) (L>R)   7. Chronic neck pain (Left)   8. Lumbar facet syndrome (Bilateral) (R>L)   9. 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 Encounter for chronic pain management    Controlled Controlled Controlled   Updated Problems: No problems updated.  Plan of Care  Problem-specific:  Assessment and Plan    Monitoring: [place] PMP: PDMP not reviewed this encounter.       Pharmacotherapy: No side-effects or adverse reactions reported. Compliance: No problems identified. Effectiveness: Clinically acceptable.  No notes on file  UDS:  Summary  Date Value Ref Range Status  02/02/2024 FINAL  Final    Comment:    ==================================================================== [employer] (MW) ==================================================================== Test                             Result       Flag       Units  Drug Present and Declared for Prescription Verification   Oxycodone                       2583         EXPECTED   ng/mg creat   Oxymorphone                    679           EXPECTED   ng/mg creat   Noroxycodone                   >5587        EXPECTED   ng/mg creat   Noroxymorphone                 787  EXPECTED   ng/mg creat    Sources of oxycodone  are scheduled prescription medications.    Oxymorphone, noroxycodone, and noroxymorphone are expected    metabolites of oxycodone . Oxymorphone is also available as a    scheduled prescription medication.  Drug Absent but Declared for Prescription Verification   Clonazepam                     Not Detected UNEXPECTED ng/mg creat ==================================================================== Test                      Result    Flag   Units      Ref Range   Creatinine              179              mg/dL      >=79 ==================================================================== Declared Medications:  The flagging and interpretation on this report are based on the  following declared medications.  Unexpected results may arise from  inaccuracies in the declared medications.   **Note: The testing scope of this panel includes these medications:   Clonazepam (Rivotril)  Oxycodone  (Percocet)   **Note: The testing scope of this panel does not include the  following reported medications:   Acetaminophen  (Percocet)  Atorvastatin  (Lipitor)  Calcium   Docusate (Colace)  Enalapril  (Vasotec )  Estradiol  (Estrace )  Furosemide  (Lasix )  Gabapentin  (Neurontin )  Magnesium  Melatonin  Methocarbamol  (Robaxin )  Mirtazapine  (Remeron )  Naloxone  (Narcan )  Omeprazole (Prilosec)  Ondansetron  (Zofran )  Spironolactone  (Aldactone )  Supplement  Vitamin D  ==================================================================== For clinical consultation, please call [phone]. ====================================================================     No results found for: CBDTHCR No results found for: D8THCCBX No results found for: D9THCCBX  ROS  Constitutional: Denies any fever or  chills Gastrointestinal: No reported hemesis, hematochezia, vomiting, or acute GI distress Musculoskeletal: Denies any acute onset joint swelling, redness, loss of ROM, or weakness Neurological: No reported episodes of acute onset apraxia, aphasia, dysarthria, agnosia, amnesia, paralysis, loss of coordination, or loss of consciousness  Medication Review  Magnesium, Melatonin, atorvastatin , docusate sodium , enalapril , estradiol , furosemide , gabapentin , methocarbamol , mirtazapine , multivitamin with minerals, naloxone , omeprazole, ondansetron , oxyCODONE -acetaminophen , and spironolactone   History Review  Allergy: Ms. Mcinroy has no known allergies. Drug: Ms. Wickstrom  reports no history of drug use. Alcohol:  reports no history of alcohol use. Tobacco:  reports that she quit smoking about 21 years ago. Her smoking use included cigarettes. She has never used smokeless tobacco. Social: Ms. Encalade  reports that she quit smoking about 21 years ago. Her smoking use included cigarettes. She has never used smokeless tobacco. She reports that she does not drink alcohol and does not use drugs. 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 Surgical: Ms. Dearmond  has a past surgical history that includes Cholecystectomy; Abdominal hysterectomy; Neck surgery; Colonoscopy with propofol  (N/A,  07/19/2015); caract surger; Laparoscopic salpingo oophorectomy (Bilateral, 03/03/2018); Cystoscopy (03/03/2018); Tonsillectomy; Appendectomy; Tumor removal; Anterior lumbar fusion (N/A, 12/11/2018); Abdominal exposure (N/A, 12/11/2018); Anterior cervical decomp/discectomy fusion (N/A, 10/06/2020); Toe Fusion (Right); Esophagogastroduodenoscopy (egd)  with propofol  (N/A, 09/11/2021); Colonoscopy with propofol  (N/A, 09/11/2021); Colonoscopy with propofol  (N/A, 03/05/2022); and Transforaminal lumbar interbody fusion w/ mis 1 level (Right, 04/30/2022). Family: family history includes Colon cancer in her maternal grandmother and mother; Diabetes in her father and mother; Hypertension in her sister; Stroke in her mother.  Laboratory Chemistry Profile   Renal Lab Results  Component Value Date   BUN 9 03/02/2024   CREATININE 1.01 (H) 03/02/2024   BCR 9 (L) 03/02/2024   GFRAA 47 (L) 12/16/2019   GFRNONAA >60 04/24/2022    Hepatic Lab Results  Component Value Date   AST 18 03/02/2024   ALT 7 03/02/2024   ALBUMIN 3.7 (L) 03/02/2024   ALKPHOS 75 03/02/2024    Electrolytes Lab Results  Component Value Date   NA 140 03/02/2024   K 4.4 03/02/2024   CL 104 03/02/2024   CALCIUM  8.9 03/02/2024   MG 2.4 (H) 12/16/2019    Bone Lab Results  Component Value Date   VD25OH 33.3 09/16/2023   25OHVITD1 53 12/16/2019   25OHVITD2 <1.0 12/16/2019   25OHVITD3 53 12/16/2019    Inflammation (CRP: Acute Phase) (ESR: Chronic Phase) Lab Results  Component Value Date   CRP 4 12/16/2019   ESRSEDRATE 6 12/16/2019         Note: Above Lab results reviewed.  Recent Imaging Review  US  Venous Img Lower Unilateral Left CLINICAL DATA:  History of prior DVT. Recent postop. Pain and swelling behind LEFT knee.  EXAM: LEFT LOWER EXTREMITY VENOUS DOPPLER ULTRASOUND  TECHNIQUE: Gray-scale sonography with compression, as well as color and duplex ultrasound, were performed to evaluate the deep venous  system(s) from the level of the common femoral vein through the popliteal and proximal calf veins.  COMPARISON:  05/17/2021  FINDINGS: VENOUS  Normal compressibility of the common femoral, superficial femoral, and popliteal veins, as well as the visualized calf veins. Visualized portions of profunda femoral vein and great saphenous vein unremarkable. No filling defects to suggest DVT on grayscale or color Doppler imaging. Doppler waveforms show normal direction of venous flow, normal respiratory plasticity and response to augmentation.  Limited views of the contralateral common femoral vein are unremarkable.  OTHER  None.  Limitations: none  IMPRESSION: No DVT of the left lower extremity.  Electronically Signed   By: Aliene Lloyd M.D.   On: 03/02/2024 14:00 Note: Reviewed        Physical Exam  Vitals: There were no vitals taken for this visit. BMI: Estimated body mass index is 25 kg/m as calculated from the following:   Height as of 06/16/24: 5' (1.524 m).   Weight as of 06/16/24: 128 lb (58.1 kg). Ideal: Patient weight not recorded General appearance: Well nourished, well developed, and well hydrated. In no apparent acute distress Mental status: Alert, oriented x 3 (person, place, & time)       Respiratory: No evidence of acute respiratory distress Eyes: PERLA   Assessment   Diagnosis Status  No diagnosis found. Controlled Controlled Controlled   Updated Problems: No problems updated.  Plan of Care  Problem-specific:  Assessment and Plan            [name] has a current medication list which includes the following long-term medication(s): atorvastatin , enalapril , estradiol , furosemide , gabapentin , mirtazapine , naloxone , omeprazole, oxycodone -acetaminophen , oxycodone -acetaminophen , oxycodone -acetaminophen , and spironolactone .  Pharmacotherapy (Medications Ordered): No orders of the defined types were placed in this encounter.  Orders:   No orders of the defined types were placed in  this encounter.    {There is no content from the last Plan section.}   No follow-ups on file.    Recent Visits Date Type Provider Dept  06/16/24 Office Visit Tanya Glisson, MD [facility]  05/03/24 Office Visit Roscoe Witts K, NP [facility]  Showing recent visits within past 90 days and meeting all other requirements Future Appointments Date Type Provider Dept  08/03/24 Appointment Norleen Xie K, NP [facility]  Showing future appointments within next 90 days and meeting all other requirements  I discussed the assessment and treatment plan with the patient. The patient was provided an opportunity to ask questions and all were answered. The patient agreed with the plan and demonstrated an understanding of the instructions.  Patient advised to call back or seek an in-person evaluation if the symptoms or condition worsens.  Duration of encounter: *** minutes.  Total time on encounter, as per AMA guidelines included both the face-to-face and non-face-to-face time personally spent by the physician and/or other qualified health care professional(s) on the day of the encounter (includes time in activities that require the physician or other qualified health care professional and does not include time in activities normally performed by clinical staff). Physician's time may include the following activities when performed: Preparing to see the patient (e.g., pre-charting review of records, searching for previously ordered imaging, lab work, and nerve conduction tests) Review of prior analgesic pharmacotherapies. Reviewing PMP Interpreting ordered tests (e.g., lab work, imaging, nerve conduction tests) Performing post-procedure evaluations, including interpretation of diagnostic procedures Obtaining and/or reviewing separately obtained history Performing a medically appropriate examination and/or  evaluation Counseling and educating the patient/family/caregiver Ordering medications, tests, or procedures Referring and communicating with other health care professionals (when not separately reported) Documenting clinical information in the electronic or other health record Independently interpreting results (not separately reported) and communicating results to the patient/ family/caregiver Care coordination (not separately reported)  Note by: Chelsye Suhre K Tassie Pollett, NP (TTS and AI technology used. I apologize for any typographical errors that were not detected and corrected.) Date: 08/03/2024; Time: 2:55 PM

## 2024-08-03 ENCOUNTER — Ambulatory Visit: Admitting: Nurse Practitioner

## 2024-08-03 ENCOUNTER — Encounter: Payer: Self-pay | Admitting: Nurse Practitioner

## 2024-08-03 VITALS — BP 131/65 | HR 95 | Temp 97.0°F | Resp 18 | Ht 60.0 in | Wt 129.0 lb

## 2024-08-03 DIAGNOSIS — M47816 Spondylosis without myelopathy or radiculopathy, lumbar region: Secondary | ICD-10-CM

## 2024-08-03 DIAGNOSIS — M961 Postlaminectomy syndrome, not elsewhere classified: Secondary | ICD-10-CM

## 2024-08-03 DIAGNOSIS — M542 Cervicalgia: Secondary | ICD-10-CM | POA: Diagnosis not present

## 2024-08-03 DIAGNOSIS — M7061 Trochanteric bursitis, right hip: Secondary | ICD-10-CM | POA: Diagnosis not present

## 2024-08-03 DIAGNOSIS — Z79891 Long term (current) use of opiate analgesic: Secondary | ICD-10-CM

## 2024-08-03 DIAGNOSIS — M7062 Trochanteric bursitis, left hip: Secondary | ICD-10-CM

## 2024-08-03 DIAGNOSIS — M79605 Pain in left leg: Secondary | ICD-10-CM | POA: Diagnosis not present

## 2024-08-03 DIAGNOSIS — G8929 Other chronic pain: Secondary | ICD-10-CM

## 2024-08-03 DIAGNOSIS — M533 Sacrococcygeal disorders, not elsewhere classified: Secondary | ICD-10-CM | POA: Diagnosis not present

## 2024-08-03 DIAGNOSIS — M79604 Pain in right leg: Secondary | ICD-10-CM | POA: Diagnosis not present

## 2024-08-03 DIAGNOSIS — G894 Chronic pain syndrome: Secondary | ICD-10-CM

## 2024-08-03 DIAGNOSIS — Z79899 Other long term (current) drug therapy: Secondary | ICD-10-CM

## 2024-08-03 DIAGNOSIS — M25551 Pain in right hip: Secondary | ICD-10-CM | POA: Diagnosis not present

## 2024-08-03 DIAGNOSIS — M25552 Pain in left hip: Secondary | ICD-10-CM | POA: Diagnosis not present

## 2024-08-03 MED ORDER — OXYCODONE-ACETAMINOPHEN 7.5-325 MG PO TABS: 1.0000 | ORAL_TABLET | Freq: Every day | ORAL | 0 refills | Status: AC

## 2024-08-03 NOTE — Patient Instructions (Signed)

## 2024-08-03 NOTE — Progress Notes (Signed)
 Nursing Pain Medication Assessment:  Safety precautions to be maintained throughout the outpatient stay will include: orient to surroundings, keep bed in low position, maintain call bell within reach at all times, provide assistance with transfer out of bed and ambulation.   Medication Inspection Compliance: Pill count conducted under aseptic conditions, in front of the patient. Neither the pills nor the bottle was removed from the patient's sight at any time. Once count was completed pills were immediately returned to the patient in their original bottle.  Medication: Oxycodone /APAP Pill/Patch Count: 1 of 150 pills/patches remain Pill/Patch Appearance: Markings consistent with prescribed medication Bottle Appearance: Standard pharmacy container. Clearly labeled. Filled Date: 01 / 04 / 2026 Last Medication intake:  Today

## 2024-08-30 ENCOUNTER — Ambulatory Visit: Admitting: Cardiovascular Disease

## 2024-10-25 ENCOUNTER — Encounter: Admitting: Nurse Practitioner
